# Patient Record
Sex: Female | Born: 1960 | Race: Black or African American | Hispanic: No | Marital: Single | Smoking: Never smoker
Health system: Southern US, Community
[De-identification: ages and names within clinical notes are randomized; demographics above are authoritative.]

## PROBLEM LIST (undated history)

## (undated) ENCOUNTER — Ambulatory Visit: Admission: EM | Source: Home / Self Care

## (undated) DIAGNOSIS — H409 Unspecified glaucoma: Secondary | ICD-10-CM

## (undated) DIAGNOSIS — M1990 Unspecified osteoarthritis, unspecified site: Secondary | ICD-10-CM

## (undated) DIAGNOSIS — E119 Type 2 diabetes mellitus without complications: Secondary | ICD-10-CM

## (undated) DIAGNOSIS — M7989 Other specified soft tissue disorders: Secondary | ICD-10-CM

## (undated) DIAGNOSIS — E785 Hyperlipidemia, unspecified: Secondary | ICD-10-CM

## (undated) DIAGNOSIS — Z8619 Personal history of other infectious and parasitic diseases: Secondary | ICD-10-CM

## (undated) DIAGNOSIS — R0602 Shortness of breath: Secondary | ICD-10-CM

## (undated) DIAGNOSIS — E538 Deficiency of other specified B group vitamins: Secondary | ICD-10-CM

## (undated) DIAGNOSIS — Z5189 Encounter for other specified aftercare: Secondary | ICD-10-CM

## (undated) DIAGNOSIS — M549 Dorsalgia, unspecified: Secondary | ICD-10-CM

## (undated) DIAGNOSIS — M3500 Sicca syndrome, unspecified: Secondary | ICD-10-CM

## (undated) DIAGNOSIS — I10 Essential (primary) hypertension: Secondary | ICD-10-CM

## (undated) DIAGNOSIS — R7303 Prediabetes: Secondary | ICD-10-CM

## (undated) DIAGNOSIS — K635 Polyp of colon: Secondary | ICD-10-CM

## (undated) DIAGNOSIS — D649 Anemia, unspecified: Secondary | ICD-10-CM

## (undated) DIAGNOSIS — R51 Headache: Secondary | ICD-10-CM

## (undated) DIAGNOSIS — M069 Rheumatoid arthritis, unspecified: Secondary | ICD-10-CM

## (undated) DIAGNOSIS — M2550 Pain in unspecified joint: Secondary | ICD-10-CM

## (undated) DIAGNOSIS — O039 Complete or unspecified spontaneous abortion without complication: Secondary | ICD-10-CM

## (undated) DIAGNOSIS — R062 Wheezing: Secondary | ICD-10-CM

## (undated) DIAGNOSIS — Z332 Encounter for elective termination of pregnancy: Secondary | ICD-10-CM

## (undated) DIAGNOSIS — R519 Headache, unspecified: Secondary | ICD-10-CM

## (undated) DIAGNOSIS — H269 Unspecified cataract: Secondary | ICD-10-CM

## (undated) DIAGNOSIS — K219 Gastro-esophageal reflux disease without esophagitis: Secondary | ICD-10-CM

## (undated) DIAGNOSIS — I2699 Other pulmonary embolism without acute cor pulmonale: Secondary | ICD-10-CM

## (undated) HISTORY — DX: Shortness of breath: R06.02

## (undated) HISTORY — DX: Prediabetes: R73.03

## (undated) HISTORY — DX: Deficiency of other specified B group vitamins: E53.8

## (undated) HISTORY — DX: Encounter for other specified aftercare: Z51.89

## (undated) HISTORY — DX: Type 2 diabetes mellitus without complications: E11.9

## (undated) HISTORY — DX: Other specified soft tissue disorders: M79.89

## (undated) HISTORY — DX: Complete or unspecified spontaneous abortion without complication: O03.9

## (undated) HISTORY — DX: Unspecified glaucoma: H40.9

## (undated) HISTORY — DX: Hyperlipidemia, unspecified: E78.5

## (undated) HISTORY — DX: Anemia, unspecified: D64.9

## (undated) HISTORY — DX: Pain in unspecified joint: M25.50

## (undated) HISTORY — DX: Essential (primary) hypertension: I10

## (undated) HISTORY — DX: Unspecified osteoarthritis, unspecified site: M19.90

## (undated) HISTORY — DX: Wheezing: R06.2

## (undated) HISTORY — DX: Polyp of colon: K63.5

## (undated) HISTORY — PX: MENISCUS REPAIR: SHX5179

## (undated) HISTORY — DX: Unspecified cataract: H26.9

## (undated) HISTORY — DX: Dorsalgia, unspecified: M54.9

## (undated) HISTORY — DX: Rheumatoid arthritis, unspecified: M06.9

## (undated) HISTORY — PX: COLONOSCOPY: SHX174

## (undated) HISTORY — PX: ABDOMINAL SURGERY: SHX537

## (undated) HISTORY — DX: Sjogren syndrome, unspecified: M35.00

## (undated) HISTORY — DX: Gastro-esophageal reflux disease without esophagitis: K21.9

## (undated) HISTORY — PX: DILATION AND CURETTAGE OF UTERUS: SHX78

## (undated) HISTORY — DX: Encounter for elective termination of pregnancy: Z33.2

## (undated) HISTORY — PX: UTERINE FIBROID SURGERY: SHX826

---

## 1992-01-02 HISTORY — PX: PELVIC LAPAROSCOPY: SHX162

## 1997-11-30 ENCOUNTER — Inpatient Hospital Stay (HOSPITAL_COMMUNITY): Admission: EM | Admit: 1997-11-30 | Discharge: 1997-12-05 | Payer: Self-pay | Admitting: *Deleted

## 1999-04-20 ENCOUNTER — Other Ambulatory Visit: Admission: RE | Admit: 1999-04-20 | Discharge: 1999-04-20 | Payer: Self-pay | Admitting: Gynecology

## 1999-12-04 ENCOUNTER — Encounter: Payer: Self-pay | Admitting: Gynecology

## 1999-12-04 ENCOUNTER — Ambulatory Visit (HOSPITAL_COMMUNITY): Admission: RE | Admit: 1999-12-04 | Discharge: 1999-12-04 | Payer: Self-pay | Admitting: Gynecology

## 2000-05-07 ENCOUNTER — Other Ambulatory Visit: Admission: RE | Admit: 2000-05-07 | Discharge: 2000-05-07 | Payer: Self-pay | Admitting: Gynecology

## 2000-08-06 ENCOUNTER — Other Ambulatory Visit: Admission: RE | Admit: 2000-08-06 | Discharge: 2000-08-06 | Payer: Self-pay | Admitting: Gynecology

## 2001-01-24 ENCOUNTER — Emergency Department (HOSPITAL_COMMUNITY): Admission: EM | Admit: 2001-01-24 | Discharge: 2001-01-25 | Payer: Self-pay | Admitting: Emergency Medicine

## 2001-05-29 ENCOUNTER — Other Ambulatory Visit: Admission: RE | Admit: 2001-05-29 | Discharge: 2001-05-29 | Payer: Self-pay | Admitting: Internal Medicine

## 2002-09-11 ENCOUNTER — Other Ambulatory Visit: Admission: RE | Admit: 2002-09-11 | Discharge: 2002-09-11 | Payer: Self-pay | Admitting: Gynecology

## 2002-09-17 ENCOUNTER — Emergency Department (HOSPITAL_COMMUNITY): Admission: EM | Admit: 2002-09-17 | Discharge: 2002-09-17 | Payer: Self-pay | Admitting: Emergency Medicine

## 2002-09-18 ENCOUNTER — Encounter: Payer: Self-pay | Admitting: Emergency Medicine

## 2002-09-18 ENCOUNTER — Emergency Department (HOSPITAL_COMMUNITY): Admission: EM | Admit: 2002-09-18 | Discharge: 2002-09-18 | Payer: Self-pay | Admitting: Emergency Medicine

## 2003-10-25 ENCOUNTER — Other Ambulatory Visit: Admission: RE | Admit: 2003-10-25 | Discharge: 2003-10-25 | Payer: Self-pay | Admitting: Gynecology

## 2003-12-16 ENCOUNTER — Ambulatory Visit (HOSPITAL_COMMUNITY): Admission: RE | Admit: 2003-12-16 | Discharge: 2003-12-16 | Payer: Self-pay | Admitting: Specialist

## 2004-10-24 ENCOUNTER — Other Ambulatory Visit: Admission: RE | Admit: 2004-10-24 | Discharge: 2004-10-24 | Payer: Self-pay | Admitting: Gynecology

## 2005-02-23 ENCOUNTER — Encounter: Admission: RE | Admit: 2005-02-23 | Discharge: 2005-02-23 | Payer: Self-pay | Admitting: Rheumatology

## 2005-06-18 ENCOUNTER — Ambulatory Visit: Payer: Self-pay | Admitting: Oncology

## 2005-07-05 LAB — CBC WITH DIFFERENTIAL/PLATELET
Basophils Absolute: 0 10*3/uL (ref 0.0–0.1)
Eosinophils Absolute: 0.1 10*3/uL (ref 0.0–0.5)
HCT: 34.2 % — ABNORMAL LOW (ref 34.8–46.6)
HGB: 11.5 g/dL — ABNORMAL LOW (ref 11.6–15.9)
MCH: 27.2 pg (ref 26.0–34.0)
MONO#: 0.4 10*3/uL (ref 0.1–0.9)
NEUT#: 0.9 10*3/uL — ABNORMAL LOW (ref 1.5–6.5)
NEUT%: 29 % — ABNORMAL LOW (ref 39.6–76.8)
RDW: 13.8 % (ref 11.3–14.5)
lymph#: 1.8 10*3/uL (ref 0.9–3.3)

## 2005-07-10 LAB — VITAMIN B12: Vitamin B-12: 623 pg/mL (ref 211–911)

## 2005-07-10 LAB — IRON AND TIBC
%SAT: 9 % — ABNORMAL LOW (ref 20–55)
Iron: 34 ug/dL — ABNORMAL LOW (ref 42–145)
TIBC: 370 ug/dL (ref 250–470)
UIBC: 336 ug/dL

## 2005-07-10 LAB — FERRITIN: Ferritin: 10 ng/mL (ref 10–291)

## 2005-07-10 LAB — HEMOGLOBINOPATHY EVALUATION
Hgb A2 Quant: 2.7 % (ref 2.2–3.2)
Hgb F Quant: 0 % (ref 0.0–0.5)

## 2005-08-17 ENCOUNTER — Ambulatory Visit: Payer: Self-pay | Admitting: Oncology

## 2005-08-24 LAB — CBC WITH DIFFERENTIAL/PLATELET
BASO%: 1.5 % (ref 0.0–2.0)
Basophils Absolute: 0.1 10*3/uL (ref 0.0–0.1)
EOS%: 2.4 % (ref 0.0–7.0)
Eosinophils Absolute: 0.1 10*3/uL (ref 0.0–0.5)
HCT: 32.2 % — ABNORMAL LOW (ref 34.8–46.6)
HGB: 10.6 g/dL — ABNORMAL LOW (ref 11.6–15.9)
LYMPH%: 57 % — ABNORMAL HIGH (ref 14.0–48.0)
MCH: 26.3 pg (ref 26.0–34.0)
MCHC: 33 g/dL (ref 32.0–36.0)
MCV: 79.7 fL — ABNORMAL LOW (ref 81.0–101.0)
MONO#: 0.3 10*3/uL (ref 0.1–0.9)
MONO%: 9.5 % (ref 0.0–13.0)
NEUT#: 1 10*3/uL — ABNORMAL LOW (ref 1.5–6.5)
NEUT%: 29.6 % — ABNORMAL LOW (ref 39.6–76.8)
Platelets: 272 10*3/uL (ref 145–400)
RBC: 4.05 10*6/uL (ref 3.70–5.32)
RDW: 15.1 % — ABNORMAL HIGH (ref 11.3–14.5)
WBC: 3.4 10*3/uL — ABNORMAL LOW (ref 3.9–10.0)
lymph#: 2 10*3/uL (ref 0.9–3.3)

## 2005-10-30 ENCOUNTER — Ambulatory Visit: Payer: Self-pay | Admitting: Oncology

## 2006-05-24 ENCOUNTER — Other Ambulatory Visit: Admission: RE | Admit: 2006-05-24 | Discharge: 2006-05-24 | Payer: Self-pay | Admitting: Gynecology

## 2006-10-10 ENCOUNTER — Ambulatory Visit: Payer: Self-pay | Admitting: Oncology

## 2007-03-26 ENCOUNTER — Ambulatory Visit (HOSPITAL_BASED_OUTPATIENT_CLINIC_OR_DEPARTMENT_OTHER): Admission: RE | Admit: 2007-03-26 | Discharge: 2007-03-26 | Payer: Self-pay | Admitting: Orthopedic Surgery

## 2007-06-26 ENCOUNTER — Other Ambulatory Visit: Admission: RE | Admit: 2007-06-26 | Discharge: 2007-06-26 | Payer: Self-pay | Admitting: Gynecology

## 2007-08-12 ENCOUNTER — Ambulatory Visit: Payer: Self-pay | Admitting: Oncology

## 2009-08-22 ENCOUNTER — Ambulatory Visit: Payer: Self-pay | Admitting: Gynecology

## 2009-08-22 ENCOUNTER — Other Ambulatory Visit: Admission: RE | Admit: 2009-08-22 | Discharge: 2009-08-22 | Payer: Self-pay | Admitting: Gynecology

## 2009-08-26 ENCOUNTER — Ambulatory Visit: Payer: Self-pay | Admitting: Gynecology

## 2009-09-29 ENCOUNTER — Ambulatory Visit: Payer: Self-pay | Admitting: Oncology

## 2009-11-03 ENCOUNTER — Ambulatory Visit: Payer: Self-pay | Admitting: Oncology

## 2010-05-16 NOTE — Op Note (Signed)
NAMEDOMINIQUE, Amber [name]           ACCOUNT NO.:  [id number]   MEDICAL RECORD NO.:  [id number]          PATIENT TYPE:  AMB   LOCATION:  DSC                          FACILITY:  [facility]   PHYSICIAN:  Harvie Junior, M.D.   DATE OF BIRTH:  1960-10-29   DATE OF PROCEDURE:  DATE OF DISCHARGE:                               OPERATIVE REPORT   PREOPERATIVE DIAGNOSIS:  Lateral meniscal tear.   POSTOPERATIVE DIAGNOSES:  1. Lateral meniscal tear.  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   PRINCIPAL PROCEDURE:  1. Partial lateral meniscectomy in the mid body.  2. Chondroplasty of medial femoral condyle.  3. Removal of multiple small cartilaginous loose bodies.   SURGEON:  John AL. Luiz Blare, M.D.   ASSISTANT:  Marshia Ly, P.AE.   ANESTHESIA:  General.   BRIEF HISTORY:  Amber [name] is a 51 year old female with a long history  of having had significant pain in the knee.  She was treated  conservatively for a period of time because of continuing complaints of  pain.  She underwent MRI examination which showed lateral meniscal tear.  She is brought to the operating room after failure of conservative care  with continued medial side pain and lateral joint line tenderness.   PROCEDURE:  The patient was taken to the operating room.  After adequate  general anesthesia was obtained with general anesthetic and patient  positioned on the operating table, the right leg was then prepped and  draped in the usual sterile fashion.  Following routine arthroscope  examination, the knee revealed there was an obvious lateral meniscal  tear.  This were debrided with the straight biting forceps and left hand  side biting forceps.  The remaining wound was contoured down with a  suction shaver.  Following this, attention was turned to the medial  compartment where a large chondral defect was encountered.  It was grade  II in nature on the medial femoral condyle in the weight bearing area.  This was  debrided back to smooth stable rim.  Multiple  osteocartilaginous loose bodies were identified in the medial  compartment up under the meniscus in the back and these were debrided  with a suction shaver.  Once this was completed, attention was turned to  the patellofemoral joint which looked pristine, slight lateral tracking,  but not enough to consider lateral release at this point.  The knee was  copiously and thoroughly irrigated. We went up into the suprapatellar  pouch, went laterally and medially looking for cartilaginous loose  bodies and more were found and these were debrided with a suction  shaver.  Once this was completed, the knee was  copiously and thoroughly irrigated and suction dried.  Arthroscopic  portals were closed with a bandage and a sterile compressive dressing  was applied.  The patient was taken to recovery and noted to be in  satisfactory condition. Estimated blood for the procedure was none.      Harvie Junior, M.D.  Electronically Signed     JLG/MEDQ  D:  03/26/2007  T:  03/26/2007  Job:  [id number]   cc:  Harvie Junior, M.D.

## 2010-08-11 ENCOUNTER — Encounter: Payer: Self-pay | Admitting: Anesthesiology

## 2010-08-25 ENCOUNTER — Encounter: Payer: Self-pay | Admitting: Gynecology

## 2010-09-15 ENCOUNTER — Ambulatory Visit (INDEPENDENT_AMBULATORY_CARE_PROVIDER_SITE_OTHER): Payer: BC Managed Care – PPO | Admitting: Gynecology

## 2010-09-15 ENCOUNTER — Other Ambulatory Visit (HOSPITAL_COMMUNITY)
Admission: RE | Admit: 2010-09-15 | Discharge: 2010-09-15 | Disposition: A | Discharge status: Home / Self Care | Payer: BC Managed Care – PPO | Source: Ambulatory Visit | Attending: Gynecology | Admitting: Gynecology

## 2010-09-15 ENCOUNTER — Encounter: Payer: Self-pay | Admitting: Gynecology

## 2010-09-15 VITALS — BP 128/82 | Ht 60.5 in | Wt 189.0 lb

## 2010-09-15 DIAGNOSIS — N946 Dysmenorrhea, unspecified: Secondary | ICD-10-CM | POA: Insufficient documentation

## 2010-09-15 DIAGNOSIS — Z01419 Encounter for gynecological examination (general) (routine) without abnormal findings: Secondary | ICD-10-CM

## 2010-09-15 DIAGNOSIS — N920 Excessive and frequent menstruation with regular cycle: Secondary | ICD-10-CM

## 2010-09-15 DIAGNOSIS — R809 Proteinuria, unspecified: Secondary | ICD-10-CM

## 2010-09-15 DIAGNOSIS — N949 Unspecified condition associated with female genital organs and menstrual cycle: Secondary | ICD-10-CM

## 2010-09-15 DIAGNOSIS — Z13220 Encounter for screening for lipoid disorders: Secondary | ICD-10-CM

## 2010-09-15 DIAGNOSIS — R102 Pelvic and perineal pain: Secondary | ICD-10-CM

## 2010-09-15 DIAGNOSIS — Z833 Family history of diabetes mellitus: Secondary | ICD-10-CM

## 2010-09-15 DIAGNOSIS — R635 Abnormal weight gain: Secondary | ICD-10-CM

## 2010-09-15 LAB — HEMOGLOBIN A1C: Hgb A1c MFr Bld: 6 % — ABNORMAL HIGH (ref <5.7)

## 2010-09-15 MED ORDER — TRANEXAMIC ACID 650 MG PO TABS: ORAL_TABLET | ORAL | Status: DC

## 2010-09-15 NOTE — Progress Notes (Signed)
Amber [name] 03-18-1960 [id number]   History:    50 y.o.  for annual exam with several complaints one being dysmenorrhea and menorrhagia her cycles are lasting about 7 days. Patient does have a history of a fibroid uterus. She had an ultrasound the office in August of last year her uterus measured 10 x 7 x 6 cm and had approximately 5 fibroids the largest one measuring 47 x 42 mm. Patient has a history in the past the right salpingostomy secondary to ectopic pregnancy back in 1994 and in 1999 she had another laparoscopy in another facility which an incidental enterotomy has occurred requiring emergency laparotomy. Her primary physician is Dr. Gertie Gowda who has been monitor her for hypertension. She suffers also from chronic anemia review of her record and indicated that in the past she had been referred to the hematologist oncologist Dr. Wendee Copp all who had worked her up for a neutral p.m. which was mild and stable and had stated this is probably a benign and normal variant often seen in African American is nevertheless she is taking iron supplementation as well. Patient's last mammogram was in August of 2011 she does her monthly self breast examinations. Recently was complaining of some tenderness on the outer aspect of her but left breast. She denied any particular masses nipple discharge or recent injury. Review of her record and indicated she was weighing 186/years and weight 189 at the present time.  Past medical history,surgical history, family history and social history were all reviewed and documented in the EPIC chart.  ROS:  Was performed and pertinent positives and negatives are included in the history.  Exam: chaperone present BP 128/82  Ht 5' 0.5" (1.537 m)  Wt 189 lb (85.73 kg)  BMI 36.30 kg/m2  LMP 08/23/2010  Body mass index is 36.30 kg/(m^2).  General appearance : Well developed well nourished female. No acute distress HEENT: Neck supple, trachea midline, no carotid bruits,  no thyroidmegaly Lungs: Clear to auscultation, no rhonchi or wheezes, or rib retractions  Heart: Regular rate and rhythm, no murmurs or gallops Breast:Examined in sitting and supine position were symmetrical in appearance, no palpable masses or tenderness,  no skin retraction, no nipple inversion, no nipple discharge, no skin discoloration, no axillary or supraclavicular lymphadenopathy Abdomen: no palpable masses or tenderness, no rebound or guarding Extremities: no edema or skin discoloration or tenderness  Pelvic:  Bartholin, Urethra, Skene Glands: Within normal limits             Vagina: No gross lesions or discharge  Cervix: No gross lesions or discharge  Uterus   approximately 10 week size and irregular,and mobile  Adnexa  Without masses or tenderness  Anus and perineum  normal   Rectovaginal  normal sphincter tone without palpated masses or tenderness             Hemoccult not done     Assessment/Plan:  50 y.o. female for annual exam with a 10-12 week size irregular uterus may be contributing factor to her dysmenorrhea and menorrhagia. Patient not sexually active. Past history of right salpingostomy for right tubal ectopic pregnancy. Will followup with an ultrasound next week. 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 She may take a nonsteroidal over-the-counter when necessary. She'll be scheduled for her screening mammogram the next couple weeks. For her dysmenorrhea and menorrhagia were going to place her on Lysteda 650 mg 2 tablets  3 times a day for 5 days. Risks benefits and pros and cons were discussed. She'll stop by the lab we'll check her CBC cholesterol we'll do a hemoglobin A1c because of her family history of diabetes or by 2 of her sisters have insulin-dependent diabetes. Because or weight gain will check her TSH as well as a urinalysis and Pap smear. We'll discuss these results  when she returns back next week for the ultrasound and plan a course of management.    Ok Edwards MD, 10:59 AM 09/15/2010

## 2010-09-15 NOTE — Progress Notes (Signed)
Addended byCammie Mcgee T on: 09/15/2010 11:35 AM   Modules accepted: Orders

## 2010-09-18 ENCOUNTER — Telehealth: Payer: Self-pay | Admitting: *Deleted

## 2010-09-18 DIAGNOSIS — D649 Anemia, unspecified: Secondary | ICD-10-CM

## 2010-09-18 NOTE — Telephone Encounter (Signed)
PT PT INFORMED WITH THE BELOW NOTE, ORDER IN COMPUTER.

## 2010-09-18 NOTE — Telephone Encounter (Signed)
Error - wrong pt

## 2010-09-18 NOTE — Telephone Encounter (Signed)
Message copied by Aura Camps on Mon Sep 18, 2010 10:46 AM ------      Message from: Ok Edwards      Created: Mon Sep 18, 2010 10:05 AM       Victorino Dike, I left a message in reference to this patient earlier this morning for her to come in a fasting state to check her fasting blood sugar. Since that time her received additional blood work and she has severe anemia. When she comes in for a fasting blood sugar I would like for her to have drawn at the same time an anemia panel. I need to see her within a week after she has the above lab work drawn.

## 2010-09-20 ENCOUNTER — Other Ambulatory Visit (INDEPENDENT_AMBULATORY_CARE_PROVIDER_SITE_OTHER): Payer: BC Managed Care – PPO | Admitting: *Deleted

## 2010-09-20 DIAGNOSIS — R7309 Other abnormal glucose: Secondary | ICD-10-CM

## 2010-09-20 DIAGNOSIS — D649 Anemia, unspecified: Secondary | ICD-10-CM

## 2010-09-20 LAB — IRON AND TIBC
%SAT: 4 % — ABNORMAL LOW (ref 20–55)
TIBC: 368 ug/dL (ref 250–470)

## 2010-09-20 LAB — FOLATE: Folate: 12.4 ng/mL

## 2010-09-20 LAB — VITAMIN B12: Vitamin B-12: 771 pg/mL (ref 211–911)

## 2010-09-22 ENCOUNTER — Telehealth: Payer: Self-pay | Admitting: *Deleted

## 2010-09-22 ENCOUNTER — Other Ambulatory Visit: Payer: Self-pay | Admitting: *Deleted

## 2010-09-22 ENCOUNTER — Ambulatory Visit (INDEPENDENT_AMBULATORY_CARE_PROVIDER_SITE_OTHER): Payer: BC Managed Care – PPO | Admitting: Gynecology

## 2010-09-22 ENCOUNTER — Ambulatory Visit (INDEPENDENT_AMBULATORY_CARE_PROVIDER_SITE_OTHER): Payer: BC Managed Care – PPO

## 2010-09-22 DIAGNOSIS — N920 Excessive and frequent menstruation with regular cycle: Secondary | ICD-10-CM

## 2010-09-22 DIAGNOSIS — N946 Dysmenorrhea, unspecified: Secondary | ICD-10-CM

## 2010-09-22 DIAGNOSIS — D649 Anemia, unspecified: Secondary | ICD-10-CM

## 2010-09-22 DIAGNOSIS — Z3049 Encounter for surveillance of other contraceptives: Secondary | ICD-10-CM

## 2010-09-22 DIAGNOSIS — D709 Neutropenia, unspecified: Secondary | ICD-10-CM

## 2010-09-22 DIAGNOSIS — D259 Leiomyoma of uterus, unspecified: Secondary | ICD-10-CM

## 2010-09-22 DIAGNOSIS — R102 Pelvic and perineal pain: Secondary | ICD-10-CM

## 2010-09-22 DIAGNOSIS — D219 Benign neoplasm of connective and other soft tissue, unspecified: Secondary | ICD-10-CM | POA: Diagnosis present

## 2010-09-22 MED ORDER — LEVONORGESTREL 20 MCG/24HR IU IUD: INTRAUTERINE_SYSTEM | Freq: Once | INTRAUTERINE | Status: DC

## 2010-09-22 NOTE — Progress Notes (Signed)
Patient is a 50 year old who seen in the office on September 14 her annual gynecological examination. Patient been complaining of dysmenorrhea and menorrhagia. She states her menstrual cycles last for approximately 7 days. Patient with prior history of ultrasound demonstrated a fibroid uterus. Last ultrasound the office on 08/26/2009 had demonstrated a uterus that measured 10.6 x 7.3 x 6.5 cm with an endometrial stripe of 6.7 mm she had approximately 5 fibroids largest one measuring 47 x 42 mm. She also had had a sonohysterogram with no intracavitary defect. At time of her annual exam we had checked her CBC and noticed that her hemoglobin was down to 8.6 hematocrit 28.3 platelet count 350,000. Her MCV was 68.9, MCH was 20.9, MCHC 30.4 all low values. An anemia panel was ordered which demonstrated a ferritin level of 8% saturation for all indicative R. deficiency anemia. Her total iron binding capacity was normal at 368. Folate was normal at 12.4 and vitamin D 12 was normal at 771. Her hemoglobin A1c was 6.0 slightly elevated she return for a fasting blood sugar which was 96. Total cholesterol was normal at 121. TSH and urinalysis and Pap smear were normal. Of note her white blood count was 3.4. She had been referred to Dr. Mancel Bale hematologist oncologist in 2007 as a result of her neutropenia and he felt there was a benign normal variant possibly benign normal variant. She has had a history of chronic microcytic anemia. She is currently taking iron one tablet daily. A hemoglobin electrophoresis was normal in July of this same year. She was supposed to return for a CBC and repeat iron studies 6 weeks from that visit in 2007 after Dr. Myrle Sheng had recommended patient to increase her to 3 times a day but never followed up on that appointment. We repeated her ultrasound today in the office the ultrasound essentially unchanged as a uterine size with the following dimension: 10.6 x 8.1 x 7.8 cm with an endometrial  stripe of 5.5 mm. The 5 fibroids were once again noted but smaller in size from previous scan last year the largest one measured 3.6 x 2.2 cm the ovaries appeared to be normal.  I spoke with Dr. Truett Perna today he recommended the patient increase her iron to 3 times a day and to repeat her CBC in one month if values are still low at he would like to see her in the office. I had started the patient on Lysteda 650 mg 2 tablets 3 times a day for 5 days of her cycle to cut down her bleeding and cramping as well. We discussed about placing her on the Mirena IUD to help decrease her amount of bleeding and minimize her chances of needing a hysterectomy which would eventually be recommended if all the above fails. Patient was otherwise asymptomatic today and is well compensated with her chronic anemia. All the above was discussed with the patient and we'll follow accordingly.

## 2010-09-22 NOTE — Telephone Encounter (Signed)
Patient informed benefits on Mirena IUD.  She will have $25 copay.  Patient will have Dr. Lily Peer insert next week.

## 2010-09-22 NOTE — Telephone Encounter (Signed)
Lm for patient to call about benefits.

## 2010-09-22 NOTE — Patient Instructions (Signed)
Will notify you with appointment to see Dr. Myrle Sheng next week. Increase your iron to 2-3 times per day. Amy will call you to arrange for IUD placement next week.

## 2010-09-22 NOTE — Telephone Encounter (Signed)
Message copied by Libby Maw on Fri Sep 22, 2010  2:15 PM ------      Message from: Ok Edwards      Created: Fri Sep 22, 2010 12:10 PM       Nicle Connole, please make arrangements for this patient to have a Mirena IUD next week. Patient with menorrhagia and severe anemia. She was instructed to take her iron tablet 3 times a day and had also given her prescription for Lysteda. Please check her insurance verification and coordinating schedule for next week. Thank you

## 2010-09-22 NOTE — Telephone Encounter (Signed)
Message copied by Libby Maw on Fri Sep 22, 2010  3:41 PM ------      Message from: Ok Edwards      Created: Fri Sep 22, 2010 12:10 PM       Amber [name], please make arrangements for this patient to have a Mirena IUD next week. Patient with menorrhagia and severe anemia. She was instructed to take her iron tablet 3 times a day and had also given her prescription for Lysteda. Please check her insurance verification and coordinating schedule for next week. Thank you

## 2010-09-25 ENCOUNTER — Encounter: Payer: Self-pay | Admitting: Gynecology

## 2010-09-25 LAB — POCT HEMOGLOBIN-HEMACUE: Hemoglobin: 11.3 — ABNORMAL LOW

## 2010-09-27 ENCOUNTER — Ambulatory Visit (INDEPENDENT_AMBULATORY_CARE_PROVIDER_SITE_OTHER): Payer: BC Managed Care – PPO | Admitting: Gynecology

## 2010-09-27 ENCOUNTER — Encounter: Payer: Self-pay | Admitting: Gynecology

## 2010-09-27 VITALS — BP 130/82

## 2010-09-27 DIAGNOSIS — Z30430 Encounter for insertion of intrauterine contraceptive device: Secondary | ICD-10-CM

## 2010-09-27 DIAGNOSIS — Z30431 Encounter for routine checking of intrauterine contraceptive device: Secondary | ICD-10-CM

## 2010-09-27 DIAGNOSIS — Z3049 Encounter for surveillance of other contraceptives: Secondary | ICD-10-CM

## 2010-09-27 NOTE — Progress Notes (Signed)
Patient presented to the office today for placement number an IUD to help with her menorrhagia and iron deficiency anemia see previous office note. Consent form had been signed patient been counseled as to risks benefits and pros and cons.  Procedure: Pelvic examination: Bartholin urethra Skene was within normal limits Vagina: No gross lesions on inspection Cervix: No lesions or discharge Uterus anteverted approximately 8 weeks size Adnexa: No palpable mass or tenderness   The vagina was prepped with Betadine solution. A single-tooth tenaculum was placed on the anterior cervical lip. The uterus sounded to 7-1/2 cm. The Mirena IUD was placed in a sterile fashion.  Patient will return back in one month for followup as well as for a CBC to check her hemoglobin since she was instructed to take iron tablets 3 times a day. Patient's not currently sexually active but was informed that the Mirena IUD is good for 5 years. Instructions were provided and will follow accordingly.

## 2010-09-27 NOTE — Patient Instructions (Signed)
Follow up 1 month for CBC and IUD check

## 2010-09-27 NOTE — Progress Notes (Signed)
Addended by: Bertram Savin A on: 09/27/2010 03:15 PM   Modules accepted: Orders

## 2010-10-05 ENCOUNTER — Encounter: Payer: Self-pay | Admitting: Gynecology

## 2010-10-24 ENCOUNTER — Encounter: Payer: Self-pay | Admitting: Gynecology

## 2010-10-24 ENCOUNTER — Ambulatory Visit (INDEPENDENT_AMBULATORY_CARE_PROVIDER_SITE_OTHER): Payer: BC Managed Care – PPO | Admitting: Gynecology

## 2010-10-24 VITALS — BP 130/80

## 2010-10-24 DIAGNOSIS — Z30431 Encounter for routine checking of intrauterine contraceptive device: Secondary | ICD-10-CM

## 2010-10-24 DIAGNOSIS — D259 Leiomyoma of uterus, unspecified: Secondary | ICD-10-CM

## 2010-10-24 DIAGNOSIS — D649 Anemia, unspecified: Secondary | ICD-10-CM

## 2010-10-24 NOTE — Patient Instructions (Signed)
This is a reminder for you: Continue to take your iron tablet 3 times a day and after the end of next month if her cycles are once a month and very light you can back down your arm tablet to 2 tablets a day. You can take the lysteda 2 tablets 3 times a day for 5 days during her menstrual cycle to cut down the amount of bleeding. I would like you to return to the office in 3 months for followup CBC blood count. When you return next year for your annual exams we'll be doing an ultrasound once a year to followup on fibroids.

## 2010-10-24 NOTE — Progress Notes (Signed)
Patient presented to the office today for followup after having placed in the Mirena IUD one month ago. This was placed in an effort to control her dysmenorrhea and iron deficiency anemia. Her hemoglobin had been as low as 8.6 and she had been referred to the hematologist as a result of her neutropenia. Her hemoglobin now today has risen to 10.6 her bleeding has decreased tremendously very blue no periods at all during one time a month. She is taking lysteda 650 mg tablet 2-3 times a day for 5 days to her cycle also to help contain the amount of bleeding. It appears to have worked. She will return back in 3 months for followup CBC. An annual he will followup with an ultrasound. Her white blood count had returned back to normal at 4.3. On exam the IUD string was seen and we'll follow accordingly. Bimanual examination unremarkable with the exception of the irregular shaped 10 week size uterus.

## 2011-10-25 LAB — HM DIABETES EYE EXAM

## 2011-11-05 ENCOUNTER — Encounter: Payer: BC Managed Care – PPO | Admitting: Gynecology

## 2011-11-05 ENCOUNTER — Ambulatory Visit (INDEPENDENT_AMBULATORY_CARE_PROVIDER_SITE_OTHER): Payer: BC Managed Care – PPO | Admitting: Gynecology

## 2011-11-05 ENCOUNTER — Encounter: Payer: Self-pay | Admitting: Gynecology

## 2011-11-05 ENCOUNTER — Other Ambulatory Visit: Payer: Self-pay | Admitting: Gynecology

## 2011-11-05 VITALS — BP 130/88 | Ht 60.5 in | Wt 197.0 lb

## 2011-11-05 DIAGNOSIS — D219 Benign neoplasm of connective and other soft tissue, unspecified: Secondary | ICD-10-CM

## 2011-11-05 DIAGNOSIS — Z833 Family history of diabetes mellitus: Secondary | ICD-10-CM

## 2011-11-05 DIAGNOSIS — M3500 Sicca syndrome, unspecified: Secondary | ICD-10-CM | POA: Insufficient documentation

## 2011-11-05 DIAGNOSIS — I10 Essential (primary) hypertension: Secondary | ICD-10-CM | POA: Insufficient documentation

## 2011-11-05 DIAGNOSIS — D259 Leiomyoma of uterus, unspecified: Secondary | ICD-10-CM

## 2011-11-05 DIAGNOSIS — Z01419 Encounter for gynecological examination (general) (routine) without abnormal findings: Secondary | ICD-10-CM

## 2011-11-05 DIAGNOSIS — Z975 Presence of (intrauterine) contraceptive device: Secondary | ICD-10-CM

## 2011-11-05 LAB — HEMOGLOBIN A1C
Hgb A1c MFr Bld: 6.4 % — ABNORMAL HIGH (ref <5.7)
Mean Plasma Glucose: 137 mg/dL — ABNORMAL HIGH (ref <117)

## 2011-11-05 LAB — CHOLESTEROL, TOTAL: Cholesterol: 118 mg/dL (ref 0–200)

## 2011-11-05 LAB — TSH: TSH: 0.923 u[IU]/mL (ref 0.350–4.500)

## 2011-11-05 MED ORDER — HYDROCHLOROTHIAZIDE 12.5 MG PO CAPS: 12.5000 mg | ORAL_CAPSULE | Freq: Every day | ORAL | Status: DC

## 2011-11-05 NOTE — Patient Instructions (Addendum)
Breast Self-Awareness  Practicing breast self-awareness may pick up problems early, prevent significant medical complications, and possibly save your life. By practicing breast self-awareness, you can become familiar with how your breasts look and feel and if your breasts are changing. This allows you to notice changes early. It can also offer you some reassurance that your breast health is good. One way to learn what is normal for your breasts and whether your breasts are changing is to do a breast self-exam.  If you find a lump or something that was not present in the past, it is best to contact your caregiver right away. Other findings that should be evaluated by your caregiver include nipple discharge, especially if it is bloody; skin changes or reddening; areas where the skin seems to be pulled in (retracted); or new lumps and bumps. Breast pain is seldom associated with cancer (malignancy), but should also be evaluated by a caregiver.  BREAST SELF-EXAM  The best time to examine your breasts is 5 7 days after your menstrual period is over. During menstruation, the breasts are lumpier, and it may be more difficult to pick up changes. If you do not menstruate, have reached menopause, or had your uterus removed (hysterectomy), you should examine your breasts at regular intervals, such as monthly. If you are breastfeeding, examine your breasts after a feeding or after using a breast pump. Breast implants do not decrease the risk for lumps or tumors, so continue to perform breast self-exams as recommended. Talk to your caregiver about how to determine the difference between the implant and breast tissue. Also, talk about the amount of pressure you should use during the exam. Over time, you will become more familiar with the variations of your breasts and more comfortable with the exam. A breast self-exam requires you to remove all your clothes above the waist.    Look at your breasts and nipples. Stand in front of  a mirror in a room with good lighting. With your hands on your hips, push your hands firmly downward. Look for a difference in shape, contour, and size from one breast to the other (asymmetry). Asymmetry includes puckers, dips, or bumps. Also, look for skin changes, such as reddened or scaly areas on the breasts. Look for nipple changes, such as discharge, dimpling, repositioning, or redness.   Carefully feel your breasts. This is best done either in the shower or tub while using soapy water or when flat on your back. Place the arm (on the side of the breast you are examining) above your head. Use the pads (not the fingertips) of your three middle fingers on your opposite hand to feel your breasts. Start in the underarm area and use  inch (2 cm) overlapping circles to feel your breast. Use 3 different levels of pressure (light, medium, and firm pressure) at each circle before moving to the next circle. The light pressure is needed to feel the tissue closest to the skin. The medium pressure will help to feel breast tissue a little deeper, while the firm pressure is needed to feel the tissue close to the ribs. Continue the overlapping circles, moving downward over the breast until you feel your ribs below your breast. Then, move one finger-width towards the center of the body. Continue to use the  inch (2 cm) overlapping circles to feel your breast as you move slowly up toward the collar bone (clavicle) near the base of the neck. Continue the up and down exam using all 3 pressures   until you reach the middle of the chest. Do this with each breast, carefully feeling for lumps or changes.   Keep a written record with breast changes or normal findings for each breast. By writing this information down, you do not need to depend only on memory for size, tenderness, or location. Write down where you are in your menstrual cycle, if you are still menstruating.   Breast tissue can have some lumps or thick tissue. However,  see your caregiver if you find anything that concerns you.   SEEK MEDICAL CARE IF:   You see a change in shape, contour, or size of your breasts or nipples.    You see skin changes, such as reddened or scaly areas on the breasts or nipples.    You have an unusual discharge from your nipples.    You feel a new lump or unusually thick areas.   Document Released: 12/18/2004 Document Revised: 06/19/2011 Document Reviewed: 04/04/2011  [employer] Patient Information 2013 [employer]

## 2011-11-05 NOTE — Progress Notes (Signed)
Amber [name] [birth year] [id number]   History:    51 y.o.  for annual gyn exam who had a Mirena IUD placed last year to help with her menorrhagia and her chronic anemia. Review of her records indicated that since 2011 she had been evaluated by Dr. Mancel Bale (hematologist oncologist as well as because of her neutropenia. She is currently taking iron supplementation 3 tablets daily. She has had a history of an ultrasound the past and sonohysterogram which had demonstrated she had several fibroids the largest one measuring 4 cm in size and normal ovaries in September 2012. Review of her record also indicated the following: 1 spontaneous AB 1 elective AB 1 right ectopic resulted in salpingostomy (1994) Laparoscopic attempt of removing ovarian cyst by another physician in [place] resulted in an incidental enterotomy regarding laparotomy  Patient states that since September of last year to September this year she was having very light or no periods at all been in October she bled for 6 days. She has history of Sjogren's disease and has been followed by Dr. Dierdre Forth her rheumatologist who has her on plaquanil  and prednisone. She has 2 sisters with insulin-dependent diabetes. Her last Pap smear was normal in 2012. Her last mammogram was normal in 2012. She in frequently does result this examination. She also has had a history of hypertension and has not followup for her primary physician and she had been on HCTZ 12.5 mg daily. She has not had a screening colonoscopy yet.  Past medical history,surgical history, family history and social history were all reviewed and documented in the EPIC chart.  Gynecologic History No LMP recorded. Patient is not currently having periods (Reason: IUD). Contraception: IUD Last Pap: 2012. Results were: normal Last mammogram: 2012. Results were: normal  Obstetric History OB History    Grav Para Term Preterm Abortions TAB SAB Ect Mult Living   3 0   2   1  0     # Outc Date GA Lbr Len/2nd Wgt Sex Del Anes PTL Lv   1 GRA            2 ABT            3 ECT                ROS: A ROS was performed and pertinent positives and negatives are included in the history.  GENERAL: No fevers or chills. HEENT: No change in vision, no earache, sore throat or sinus congestion. NECK: No pain or stiffness. CARDIOVASCULAR: No chest pain or pressure. No palpitations. PULMONARY: No shortness of breath, cough or wheeze. GASTROINTESTINAL: No abdominal pain, nausea, vomiting or diarrhea, melena or bright red blood per rectum. GENITOURINARY: No urinary frequency, urgency, hesitancy or dysuria. MUSCULOSKELETAL: No joint or muscle pain, no back pain, no recent trauma. DERMATOLOGIC: No rash, no itching, no lesions. ENDOCRINE: No polyuria, polydipsia, no heat or cold intolerance. No recent change in weight. HEMATOLOGICAL: No anemia or easy bruising or bleeding. NEUROLOGIC: No headache, seizures, numbness, tingling or weakness. PSYCHIATRIC: No depression, no loss of interest in normal activity or change in sleep pattern.     Exam: chaperone present  BP 130/88  Ht 5' 0.5" (1.537 m)  Wt 197 lb (89.359 kg)  BMI 37.84 kg/m2  Body mass index is 37.84 kg/(m^2).  General appearance : Well developed well nourished female. No acute distress HEENT: Neck supple, trachea midline, no carotid bruits, no thyroidmegaly Lungs: Clear to auscultation, no rhonchi or wheezes, or  rib retractions  Heart: Regular rate and rhythm, no murmurs or gallops Breast:Examined in sitting and supine position were symmetrical in appearance, no palpable masses or tenderness,  no skin retraction, no nipple inversion, no nipple discharge, no skin discoloration, no axillary or supraclavicular lymphadenopathy Abdomen: no palpable masses or tenderness, no rebound or guarding Extremities: no edema or skin discoloration or tenderness  Pelvic:  Bartholin, Urethra, Skene Glands: Within normal limits              Vagina: No gross lesions or discharge  Cervix: No gross lesions or discharge  Uterus  anteverted 8-10 week size slightly irregular,   Adnexa  Without masses or tenderness  Anus and perineum  normal   Rectovaginal  normal sphincter tone without palpated masses or tenderness             Hemoccult cards provided     Assessment/Plan:  51 y.o. female for annual exam patient with long-standing history of iron deficiency anemia attributed to her fibroid uterus. It appears the Mirena IUD has helped her tremendously. We will check today her CBC see what her hemoglobin is. We'll also check a comprehensive metabolic panel, TSH, screening cholesterol, hemoglobin A1c and urinalysis. She was given a prescription refill for her HCTZ 12.5 mg daily for hypertension and was reminded to followup with her internist. She will return back to the office in 2 weeks for followup ultrasound to follow the transit of her fibroid uterus. She will continue on her iron tablet one tablet 3 times a day. She was given the name of her gastroenterologist her to schedule her screening colonoscopy. She was reminded to submit to the office the Hemoccult cards for testing. We discussed importance of calcium and vitamin D and regular exercise for osteoporosis prevention. Patient declined flu vaccine today. Pap smear not done today new screening guidelines discussed.    Ok Edwards MD, 1:41 PM 11/05/2011

## 2011-11-06 LAB — COMPREHENSIVE METABOLIC PANEL
ALT: 15 U/L (ref 0–35)
AST: 14 U/L (ref 0–37)
Alkaline Phosphatase: 46 U/L (ref 39–117)
Creat: 0.68 mg/dL (ref 0.50–1.10)
Sodium: 136 mEq/L (ref 135–145)
Total Bilirubin: 0.4 mg/dL (ref 0.3–1.2)
Total Protein: 9.4 g/dL — ABNORMAL HIGH (ref 6.0–8.3)

## 2011-11-06 LAB — CBC WITH DIFFERENTIAL/PLATELET
HCT: 33.5 % — ABNORMAL LOW (ref 36.0–46.0)
Hemoglobin: 10.8 g/dL — ABNORMAL LOW (ref 12.0–15.0)
Lymphocytes Relative: 49 % — ABNORMAL HIGH (ref 12–46)
Lymphs Abs: 2.3 10*3/uL (ref 0.7–4.0)
MCHC: 32.2 g/dL (ref 30.0–36.0)
Monocytes Absolute: 0.3 10*3/uL (ref 0.1–1.0)
Monocytes Relative: 7 % (ref 3–12)
Neutro Abs: 2 10*3/uL (ref 1.7–7.7)
Neutrophils Relative %: 44 % (ref 43–77)
RBC: 4.25 MIL/uL (ref 3.87–5.11)
WBC: 4.7 10*3/uL (ref 4.0–10.5)

## 2011-11-06 LAB — URINALYSIS W MICROSCOPIC + REFLEX CULTURE
Bacteria, UA: NONE SEEN
Bilirubin Urine: NEGATIVE
Casts: NONE SEEN
Crystals: NONE SEEN
Hgb urine dipstick: NEGATIVE
Ketones, ur: NEGATIVE mg/dL
Nitrite: NEGATIVE
Specific Gravity, Urine: 1.028 (ref 1.005–1.030)
pH: 5.5 (ref 5.0–8.0)

## 2011-11-07 ENCOUNTER — Ambulatory Visit (INDEPENDENT_AMBULATORY_CARE_PROVIDER_SITE_OTHER): Payer: Self-pay | Admitting: Family

## 2011-11-07 ENCOUNTER — Encounter: Payer: Self-pay | Admitting: Family

## 2011-11-07 VITALS — BP 174/96 | HR 74 | Temp 98.6°F | Resp 16 | Ht 60.5 in | Wt 197.0 lb

## 2011-11-07 DIAGNOSIS — R7309 Other abnormal glucose: Secondary | ICD-10-CM

## 2011-11-07 DIAGNOSIS — D649 Anemia, unspecified: Secondary | ICD-10-CM

## 2011-11-07 DIAGNOSIS — I10 Essential (primary) hypertension: Secondary | ICD-10-CM

## 2011-11-07 DIAGNOSIS — R7303 Prediabetes: Secondary | ICD-10-CM

## 2011-11-07 DIAGNOSIS — M3500 Sicca syndrome, unspecified: Secondary | ICD-10-CM

## 2011-11-07 DIAGNOSIS — H409 Unspecified glaucoma: Secondary | ICD-10-CM | POA: Insufficient documentation

## 2011-11-07 DIAGNOSIS — O039 Complete or unspecified spontaneous abortion without complication: Secondary | ICD-10-CM

## 2011-11-07 DIAGNOSIS — Z23 Encounter for immunization: Secondary | ICD-10-CM

## 2011-11-07 DIAGNOSIS — M069 Rheumatoid arthritis, unspecified: Secondary | ICD-10-CM

## 2011-11-07 HISTORY — DX: Rheumatoid arthritis, unspecified: M06.9

## 2011-11-07 HISTORY — DX: Complete or unspecified spontaneous abortion without complication: O03.9

## 2011-11-07 LAB — IRON AND TIBC: TIBC: 345 ug/dL (ref 250–470)

## 2011-11-07 MED ORDER — LISINOPRIL 10 MG PO TABS: 10.0000 mg | ORAL_TABLET | Freq: Every day | ORAL | Status: DC

## 2011-11-07 MED ORDER — HYDROCHLOROTHIAZIDE 25 MG PO TABS: 25.0000 mg | ORAL_TABLET | Freq: Every day | ORAL | Status: DC

## 2011-11-07 NOTE — Progress Notes (Signed)
Subjective:    Patient ID: Amber [name], female    DOB: 1960/08/17, 51 y.o.   MRN: [id number]  HPI  Amber [name] is a 51 yr old female who presents today to establish care.  She was referred to [place] to establish care due to anemia and hyperglycemia.  1) Borderline diabetes- A1C at GYN office was 6.4. Had eye  Exam last month.  Was told that she had increased pressure and is being followed every 3 months for glaucoma.   2) Anemia-  Last hgb was 10.8, MCV was low.  Old records reviewed and note last year b12 and folate were in the normal range. Pt reports that she continues to have monthly cycles (except September).  Sometimes twice a month.  Heavy at times.    3) RA/Hx Sjogren's disease- Diagnosed a few years ago.  She was referred to rheumatologist.  She sees Dr. Dierdre Forth. She is completing a taper of prednisone due to recent flare up.  She is also on plaquenil.    4) HTN- has only been on hctz 12.5  Review of Systems  Constitutional: Negative for unexpected weight change.  HENT: Negative for ear discharge.   Eyes: Negative for visual disturbance.  Respiratory: Negative for shortness of breath.   Cardiovascular: Negative for chest pain.  Gastrointestinal: Negative for nausea, vomiting, abdominal pain and constipation.  Genitourinary: Negative for dysuria and frequency.  Musculoskeletal: Positive for arthralgias.  Skin: Negative for rash.  Neurological: Positive for headaches.  Hematological: Negative for adenopathy.  Psychiatric/Behavioral:       Denies depression/anxiety   Past Medical History  Diagnosis Date  . SAB (spontaneous abortion)   . Elective abortion   . Ectopic pregnancy     RIGHT salpingostomy  . Hypertension   . GERD (gastroesophageal reflux disease)   . Anemia   . Sjogren's disease   . IUD 09/27/2010    Mirena IUD  . Glaucoma     both eyes  . Rheumatoid arthritis 11/07/2011    History   Social History  . Marital Status: Single    Spouse Name: N/A    Number of Children: 0  . Years of Education: N/A   Occupational History  .     Social History Main Topics  . Smoking status: Never Smoker   . Smokeless tobacco: Never Used  . Alcohol Use: No  . Drug Use: No  . Sexually Active: No   Other Topics Concern  . 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  Age 75.     Past Surgical History  Procedure Date  . Dilation and curettage of uterus     X 2  . Knee surgery     MENISCUS TEAR REPAIR  . Pelvic laparoscopy 1994    IN 1999 LAPAROSCOPY WITH INCIDENTAL ENTEROTOMY REQUIRING LAPAROTOMY    Family History  Problem Relation Age of Onset  . Hypertension Mother     died from heart disease  . Heart disease Mother     deceased, unknown cause  . Diabetes Sister   . Hypertension Sister   . Diabetes Sister     No Known Allergies  Current Outpatient Prescriptions on File Prior to Visit  Medication Sig Dispense Refill  . Calcium Carbonate-Vitamin D (CALTRATE 600+D PO) Take 600 mg by mouth 2 (  two) times daily.        . ferrous sulfate 325 (65 FE) MG tablet Take 325 mg by mouth daily with breakfast.        . hydroxychloroquine (PLAQUENIL) 200 MG tablet Take by mouth 2 (two) times daily.      . [DISCONTINUED] hydrochlorothiazide (MICROZIDE) 12.5 MG capsule Take 1 capsule (12.5 mg total) by mouth daily.  30 capsule  11  . lisinopril (PRINIVIL,ZESTRIL) 10 MG tablet Take 1 tablet (10 mg total) by mouth daily.  30 tablet  0  . tranexamic acid (LYSTEDA) 650 MG TABS Take two tablets three times a day for 5 days when menses start  30 tablet  11   Current Facility-Administered Medications on File Prior to Visit  Medication Dose Route Frequency Provider Last Rate Last Dose  . levonorgestrel (MIRENA) 20 MCG/24HR IUD   Intrauterine Once Ok Edwards, MD        BP 174/96  Pulse 74  Temp 98.6 F (37 C) (Oral)  Resp 16  Ht 5' 0.5" (1.537 m)  Wt 197 lb (89.359 kg)  BMI 37.84 kg/m2  SpO2 98%       Objective:   Physical Exam  Constitutional: She is oriented to person, place, and time. She appears well-developed and well-nourished.       Pleasant black female, awake, alert and in NAD  HENT:  Head: Normocephalic and atraumatic.  Right Ear: Tympanic membrane and ear canal normal.  Left Ear: Tympanic membrane and ear canal normal.  Mouth/Throat: No posterior oropharyngeal edema or posterior oropharyngeal erythema.  Eyes: No scleral icterus.  Cardiovascular: Normal rate and regular rhythm.   No murmur heard. Pulmonary/Chest: Effort normal and breath sounds normal. No respiratory distress. She has no wheezes. She has no rales. She exhibits no tenderness.  Musculoskeletal: She exhibits no edema.  Lymphadenopathy:    She has no cervical adenopathy.  Neurological: She is alert and oriented to person, place, and time.  Skin: Skin is warm and dry.  Psychiatric: She has a normal mood and affect. Her behavior is normal. Judgment normal.          Assessment & Plan:

## 2011-11-07 NOTE — Assessment & Plan Note (Signed)
Management per rheumatology.  

## 2011-11-07 NOTE — Patient Instructions (Addendum)
Please complete lab work prior to leaving.  Start lisinopril and a baby aspirin. Increase hydrochlorothiazide to 25mg  once daily. Follow up in 2 weeks.

## 2011-11-07 NOTE — Assessment & Plan Note (Signed)
Currently completing a prednisone taper.  Management per rheumatology- Dr. Dierdre Forth.

## 2011-11-07 NOTE — Assessment & Plan Note (Signed)
Add ACE, pt reports pneumovax is up to date.  Flu shot today. Check urine microalbumin. We discussed diabetic diet, exercise and weight loss.

## 2011-11-07 NOTE — Assessment & Plan Note (Signed)
She is followed by opthalmology for this.

## 2011-11-07 NOTE — Assessment & Plan Note (Signed)
Deteriorated. Add lisinopril for BP control and renal protection.  Increase HCTZ from 12.5 to 25 mg.

## 2011-11-07 NOTE — Assessment & Plan Note (Signed)
On iron supplement. Check iron level.  Likely due to menstrual loss, however she is also scheduled with GI to help rule out GI loss as a contributing factor.

## 2011-11-08 LAB — MICROALBUMIN / CREATININE URINE RATIO: Creatinine, Urine: 187 mg/dL

## 2011-11-09 ENCOUNTER — Telehealth: Payer: Self-pay | Admitting: Family

## 2011-11-09 NOTE — Telephone Encounter (Signed)
Pls call pt and let her know that her iron is still low. I would like her to increase iron supplement to BID please.

## 2011-11-09 NOTE — Telephone Encounter (Signed)
Left message on home # to return my call. 

## 2011-11-12 ENCOUNTER — Encounter: Payer: Self-pay | Admitting: Gynecology

## 2011-11-20 ENCOUNTER — Ambulatory Visit (INDEPENDENT_AMBULATORY_CARE_PROVIDER_SITE_OTHER): Payer: Self-pay | Admitting: Family

## 2011-11-20 VITALS — BP 110/80 | HR 90 | Temp 98.3°F | Resp 16 | Ht 60.5 in | Wt 187.0 lb

## 2011-11-20 DIAGNOSIS — I10 Essential (primary) hypertension: Secondary | ICD-10-CM

## 2011-11-20 LAB — BASIC METABOLIC PANEL
BUN: 13 mg/dL (ref 6–23)
Chloride: 99 mEq/L (ref 96–112)
Potassium: 4 mEq/L (ref 3.5–5.3)
Sodium: 133 mEq/L — ABNORMAL LOW (ref 135–145)

## 2011-11-20 NOTE — Progress Notes (Signed)
Subjective:    Patient ID: Amber [name], female    DOB: January 09, 1960, 51 y.o.   MRN: [id number]  HPI  Amber [name] is a 51 yr old female who presents today for follow up.  HTN- Last visit her hctz was increased to 25mg  from 12.5 and lisinopril was added to her regimen.  Cough-Denies cold symptoms.  "feels like a bubble in my chest, then I cough and it is gone."   Review of Systems + joint pain  Past Medical History  Diagnosis Date  . SAB (spontaneous abortion)   . Elective abortion   . Ectopic pregnancy     RIGHT salpingostomy  . Hypertension   . GERD (gastroesophageal reflux disease)   . Anemia   . Sjogren's disease   . IUD 09/27/2010    Mirena IUD  . Glaucoma     both eyes  . Rheumatoid arthritis 11/07/2011    History   Social History  . Marital Status: Single    Spouse Name: N/A    Number of Children: 0  . Years of Education: N/A   Occupational History  .     Social History Main Topics  . Smoking status: Never Smoker   . Smokeless tobacco: Never Used  . Alcohol Use: No  . Drug Use: No  . Sexually Active: No   Other Topics Concern  . 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  Age 51.     Past Surgical History  Procedure Date  . Dilation and curettage of uterus     X 2  . Knee surgery     MENISCUS TEAR REPAIR  . Pelvic laparoscopy 1994    IN 1999 LAPAROSCOPY WITH INCIDENTAL ENTEROTOMY REQUIRING LAPAROTOMY    Family History  Problem Relation Age of Onset  . Hypertension Mother     died from heart disease  . Heart disease Mother     deceased, unknown cause  . Diabetes Sister   . Hypertension Sister   . Diabetes Sister     No Known Allergies  Current Outpatient Prescriptions on File Prior to Visit  Medication Sig Dispense Refill    . aspirin 81 MG tablet Take 81 mg by mouth daily.      . Calcium Carbonate-Vitamin D (CALTRATE 600+D PO) Take 600 mg by mouth 2 (two) times daily.        . ferrous sulfate 325 (65 FE) MG tablet Take 325 mg by mouth 2 (two) times daily.       . hydrochlorothiazide (HYDRODIURIL) 25 MG tablet Take 1 tablet (25 mg total) by mouth daily.  30 tablet  0  . hydroxychloroquine (PLAQUENIL) 200 MG tablet Take by mouth 2 (two) times daily.      Marland Kitchen lisinopril (PRINIVIL,ZESTRIL) 10 MG tablet Take 1 tablet (10 mg total) by mouth daily.  30 tablet  0  . predniSONE (DELTASONE) 5 MG tablet Taking dosepack.      . TRAVATAN Z 0.004 % SOLN ophthalmic solution Place 1 drop into both eyes At bedtime.       Current Facility-Administered Medications on File Prior to Visit  Medication Dose Route Frequency Provider Last Rate Last Dose  . levonorgestrel (MIRENA) 20 MCG/24HR IUD   Intrauterine Once Ok Edwards, MD        BP  110/80  Pulse 90  Temp 98.3 F (36.8 C) (Oral)  Resp 16  Ht 5' 0.5" (1.537 m)  Wt 187 lb 0.6 oz (84.841 kg)  BMI 35.93 kg/m2  SpO2 99%       Objective:   Physical Exam  Constitutional: She is oriented to person, place, and time. She appears well-developed and well-nourished. No distress.  HENT:  Head: Normocephalic and atraumatic.  Cardiovascular: Normal rate and regular rhythm.   No murmur heard. Pulmonary/Chest: Effort normal and breath sounds normal. No respiratory distress. She has no wheezes. She has no rales. She exhibits no tenderness.  Lymphadenopathy:    She has no cervical adenopathy.  Neurological: She is alert and oriented to person, place, and time.  Skin: Skin is warm and dry. No rash noted. No erythema. No pallor.  Psychiatric: She has a normal mood and affect. Her behavior is normal. Judgment and thought content normal.          Assessment & Plan:

## 2011-11-20 NOTE — Patient Instructions (Addendum)
Please complete your lab work prior to leaving. Call if cough is not resolved in 1 week or if cough worsens. Please schedule a follow up appointment in 3 months.

## 2011-11-20 NOTE — Assessment & Plan Note (Signed)
BP looks great today. We discussed possibility that her cough is due to ACE inhibitor.  She wishes to continue med for now and see how she does which is reasonable. If cough persists, plan change to ARB due to microalbuminuria. Obtain follow up BMET.

## 2011-11-21 ENCOUNTER — Ambulatory Visit (INDEPENDENT_AMBULATORY_CARE_PROVIDER_SITE_OTHER): Payer: BC Managed Care – PPO

## 2011-11-21 ENCOUNTER — Other Ambulatory Visit: Payer: Self-pay | Admitting: Gynecology

## 2011-11-21 ENCOUNTER — Encounter: Payer: Self-pay | Admitting: Family

## 2011-11-21 ENCOUNTER — Encounter: Payer: Self-pay | Admitting: Gynecology

## 2011-11-21 ENCOUNTER — Ambulatory Visit (INDEPENDENT_AMBULATORY_CARE_PROVIDER_SITE_OTHER): Payer: Self-pay | Admitting: Gynecology

## 2011-11-21 VITALS — BP 108/74

## 2011-11-21 DIAGNOSIS — T8332XA Displacement of intrauterine contraceptive device, initial encounter: Secondary | ICD-10-CM

## 2011-11-21 DIAGNOSIS — D259 Leiomyoma of uterus, unspecified: Secondary | ICD-10-CM

## 2011-11-21 DIAGNOSIS — N852 Hypertrophy of uterus: Secondary | ICD-10-CM

## 2011-11-21 DIAGNOSIS — Z975 Presence of (intrauterine) contraceptive device: Secondary | ICD-10-CM

## 2011-11-21 DIAGNOSIS — D649 Anemia, unspecified: Secondary | ICD-10-CM

## 2011-11-21 DIAGNOSIS — D219 Benign neoplasm of connective and other soft tissue, unspecified: Secondary | ICD-10-CM

## 2011-11-21 DIAGNOSIS — D252 Subserosal leiomyoma of uterus: Secondary | ICD-10-CM

## 2011-11-21 DIAGNOSIS — N942 Vaginismus: Secondary | ICD-10-CM

## 2011-11-21 DIAGNOSIS — N831 Corpus luteum cyst of ovary, unspecified side: Secondary | ICD-10-CM

## 2011-11-21 DIAGNOSIS — D251 Intramural leiomyoma of uterus: Secondary | ICD-10-CM

## 2011-11-21 NOTE — Progress Notes (Signed)
Patient is a 51 year old who was seen in the office for her annual exam on November 4 of this year. Patient has had a Mirena IUD placed last year to help with her menorrhagia and her chronic anemia. Review of her records indicated that since 2011 she had been evaluated by Dr. Mancel Bale (hematologist oncologist as well as because of her neutropenia. She is currently taking iron supplementation 3 tablets daily. She has had a history of an ultrasound the past and sonohysterogram which had demonstrated she had several fibroids the largest one measuring 4 cm in size and normal ovaries in September 2012. Review of her record also indicated the following:   1 spontaneous AB  1 elective AB  1 right ectopic resulted in salpingostomy (1994)  Laparoscopic attempt of removing ovarian cyst by another physician in [place] resulted in an incidental enterotomy regarding laparotomy  She has history of Sjogren's disease and has been followed by Dr. Dierdre Forth her rheumatologist who has her on plaquanil and prednisone.She also has had a history of hypertension and has not followup for her primary physician and she had been on HCTZ 12.5 mg daily. From her last office visit she had mentioned that her bleeding had decreased is a Mirena IUD was placed. She states sometimes as a light. Sometimes are very heavy. She was asked to return to the office today for an ultrasound to follow up on her fibroid uterus. Also during her pelvic exam somewhat difficult because some mild vaginismus. Ultrasound today demonstrated the following:  Uterus measured 10.8 x 7.3 x 6.7 cm endometrial stripe 6.0 mm. Several intramural and subserosal fibroids the largest one measuring 25 x 24 mm. The IUD was seen in the lower uterine segment the right ovary had a thin-walled cyst measuring 30 x 25 x 22 mm which appeared to be avascular but some bright echogenicity like floating debris was noted possibly hemorrhagic cyst. Left ovary appeared to be  collapsed with a measurement of 22 x 12 x 20 mm thick wall negative color flow possible corpus luteum cyst. There was no fluid in the cul-de-sac.  Patient has been adamant of proceeding with a hysterectomy for many years. She will return back to the office next week to replace the IUD under sonographic guidance to see if we can place it higher into the cavity and help with her bleeding and hopefully correct her anemia. She was once again reminded to follow up with her appointment with her gastroenterologist tomorrow for her colonoscopy. When she returns for the IUD change we will give her A shot of Depo-Provera 150 mg IM to see if this would help shrink her cyst. Will then followup with an ultrasound 3 months after the shot. If after all the above has been completed and she continues to have bleeding we had discussed office endometrial ablation via her option technique. Literature information was provided.

## 2011-11-27 ENCOUNTER — Telehealth: Payer: Self-pay | Admitting: *Deleted

## 2011-11-27 ENCOUNTER — Telehealth: Payer: Self-pay | Admitting: Gynecology

## 2011-11-27 NOTE — Telephone Encounter (Signed)
I spoke w/pt today and advised her that per her [facility] plan they consider removing and reinserting new Mirena as office surgery. She would be responsible for the  Deductible amount of $1,000 and 20% of remaining $953.00--$190.60. Her total cost is $1,193.60 includes the ultrasound guidance needed per JF, removal and insertion of new Mirena. She has appt for 12/2/813. She is speaking with triage to ck with JF concerning questions about this as she doesn't have the money to do this and will advise if we need to cancel the 12/03/11 appt/WL

## 2011-11-27 NOTE — Telephone Encounter (Signed)
Pt was advised the removal of the Mirena was covered under $20.00 copay with JF also charging for an OV. She was reminded that she has that pre-existing clause in her insurance which the claim will go through first. If they deem it preexisting it will go towards her deductible. Per Dennie Bible H her charge will be $190.00.WL

## 2011-11-27 NOTE — Telephone Encounter (Signed)
Pt informed with the below note, she just wants removal of IUD and no reinsertion for new IUD. Pt said she does not have that kind of money.

## 2011-11-27 NOTE — Telephone Encounter (Signed)
Pt is scheduled to have IUD removed with 12/03/11 replace the IUD under u/s guidance. Pt spoke with wendy and Her total cost is $1,193.60 includes the ultrasound guidance needed, removal and insertion of new Mirena. Pt said she cant afford this cost, she asked if IUD could be removed without U/S? Please advise

## 2011-11-27 NOTE — Telephone Encounter (Signed)
We can remove it with out the ultrasound it was the reinsertion to make sure we insert it high in the fundus where it will have a better effect instead of low where it has settled down to.She may want to work out some payment plan with [facility]?

## 2011-12-03 ENCOUNTER — Encounter: Payer: Self-pay | Admitting: Gynecology

## 2011-12-03 ENCOUNTER — Other Ambulatory Visit: Payer: BC Managed Care – PPO

## 2011-12-03 ENCOUNTER — Ambulatory Visit (INDEPENDENT_AMBULATORY_CARE_PROVIDER_SITE_OTHER): Payer: BC Managed Care – PPO | Admitting: Gynecology

## 2011-12-03 ENCOUNTER — Ambulatory Visit: Payer: BC Managed Care – PPO | Admitting: Gynecology

## 2011-12-03 VITALS — BP 122/78

## 2011-12-03 DIAGNOSIS — N938 Other specified abnormal uterine and vaginal bleeding: Secondary | ICD-10-CM

## 2011-12-03 DIAGNOSIS — N949 Unspecified condition associated with female genital organs and menstrual cycle: Secondary | ICD-10-CM

## 2011-12-03 DIAGNOSIS — D649 Anemia, unspecified: Secondary | ICD-10-CM

## 2011-12-03 DIAGNOSIS — D219 Benign neoplasm of connective and other soft tissue, unspecified: Secondary | ICD-10-CM

## 2011-12-03 DIAGNOSIS — D259 Leiomyoma of uterus, unspecified: Secondary | ICD-10-CM

## 2011-12-03 NOTE — Progress Notes (Signed)
Patient is a 51 year old who has had history of menorrhagia and chronic anemia as a result of leiomyomatous uteri.Review of her records indicated that since 2011 she had been evaluated by Dr. Mancel Bale (hematologist oncologist as well as because of her neutropenia. She is currently taking iron supplementation 3 tablets daily. She has had a history of an ultrasound the past and sonohysterogram which had demonstrated she had several fibroids the largest one measuring 4 cm in size and normal ovaries in September 2012. Review of her record also indicated the following:   1 spontaneous AB  1 elective AB  1 right ectopic resulted in salpingostomy (1994)  Laparoscopic attempt of removing ovarian cyst by another physician in [place] resulted in an incidental enterotomy regarding laparotomy  Ultrasound done on November 20 demonstrated a Uterus measured 10.8 x 7.3 x 6.7 cm endometrial stripe 6.0 mm. Several intramural and subserosal fibroids the largest one measuring 25 x 24 mm. The IUD was seen in the lower uterine segment the right ovary had a thin-walled cyst measuring 30 x 25 x 22 mm which appeared to be avascular but some bright echogenicity like floating debris was noted possibly hemorrhagic cyst. Left ovary appeared to be collapsed with a measurement of 22 x 12 x 20 mm thick wall negative color flow possible corpus luteum cyst. There was no fluid in the cul-de-sac.  Patient has been adamant of proceeding with a hysterectomy for many years. She was asked to return today to replace the IUD under sonographic guidance. Because her high deductible she states she wants to wait. She has not had a menstrual cycle in October and not in November. She scheduled for colonoscopy next week. She will continue on iron supplementation 3 times a day. She did receive a shot of Depo-Provera 150 mg IM last office visit in an effort to decrease the cyst seen on recent ultrasound and she'll return back to the office in 3  months for followup scan. We discussed that if her bleeding continues with the IUD and even after removal of the definitive surgery as indicated such as a hysterectomy with ovarian conservation. Patient fully understands and accepts.

## 2011-12-08 ENCOUNTER — Emergency Department (HOSPITAL_BASED_OUTPATIENT_CLINIC_OR_DEPARTMENT_OTHER)
Admission: EM | Admit: 2011-12-08 | Discharge: 2011-12-08 | Disposition: A | Discharge status: Home / Self Care | Payer: BC Managed Care – PPO | Attending: Emergency Medicine | Admitting: Emergency Medicine

## 2011-12-08 ENCOUNTER — Encounter (HOSPITAL_BASED_OUTPATIENT_CLINIC_OR_DEPARTMENT_OTHER): Payer: Self-pay | Admitting: *Deleted

## 2011-12-08 DIAGNOSIS — Z79899 Other long term (current) drug therapy: Secondary | ICD-10-CM | POA: Insufficient documentation

## 2011-12-08 DIAGNOSIS — Z7982 Long term (current) use of aspirin: Secondary | ICD-10-CM | POA: Insufficient documentation

## 2011-12-08 DIAGNOSIS — M069 Rheumatoid arthritis, unspecified: Secondary | ICD-10-CM | POA: Insufficient documentation

## 2011-12-08 DIAGNOSIS — I10 Essential (primary) hypertension: Secondary | ICD-10-CM | POA: Insufficient documentation

## 2011-12-08 DIAGNOSIS — K219 Gastro-esophageal reflux disease without esophagitis: Secondary | ICD-10-CM | POA: Insufficient documentation

## 2011-12-08 DIAGNOSIS — Z9889 Other specified postprocedural states: Secondary | ICD-10-CM | POA: Insufficient documentation

## 2011-12-08 DIAGNOSIS — M3500 Sicca syndrome, unspecified: Secondary | ICD-10-CM | POA: Insufficient documentation

## 2011-12-08 DIAGNOSIS — B029 Zoster without complications: Secondary | ICD-10-CM | POA: Insufficient documentation

## 2011-12-08 DIAGNOSIS — Z862 Personal history of diseases of the blood and blood-forming organs and certain disorders involving the immune mechanism: Secondary | ICD-10-CM | POA: Insufficient documentation

## 2011-12-08 HISTORY — DX: Personal history of other infectious and parasitic diseases: Z86.19

## 2011-12-08 MED ORDER — ACYCLOVIR 800 MG PO TABS: 800.0000 mg | ORAL_TABLET | Freq: Four times a day (QID) | ORAL | Status: DC

## 2011-12-08 MED ORDER — OXYCODONE-ACETAMINOPHEN 5-325 MG PO TABS: 2.0000 | ORAL_TABLET | ORAL | Status: DC | PRN

## 2011-12-08 NOTE — ED Notes (Signed)
Pt with painful rash on left side of hip, groin and leg x 4 days

## 2011-12-08 NOTE — ED Provider Notes (Signed)
History     CSN: [id number]  Arrival date & time 12/08/11  1726   First MD Initiated Contact with Patient 12/08/11 1835      Chief Complaint  Patient presents with  . Rash    (Consider location/radiation/quality/duration/timing/severity/associated sxs/prior treatment) Patient is a 51 y.o. female presenting with rash. The history is provided by the patient. No language interpreter was used.  Rash  This is a new problem. Episode onset: 4 days. The problem has been gradually worsening. The problem is associated with nothing. The rash is present on the abdomen and back. The pain is at a severity of 10/10. The pain is severe. The pain has been worsening since onset. Associated symptoms include blisters and pain. She has tried nothing for the symptoms. The treatment provided moderate relief.  Pt reports she thinks she has shingles  Past Medical History  Diagnosis Date  . SAB (spontaneous abortion)   . Elective abortion   . Ectopic pregnancy     RIGHT salpingostomy  . Hypertension   . GERD (gastroesophageal reflux disease)   . Anemia   . Sjogren's disease   . IUD 09/27/2010    Mirena IUD  . Glaucoma     both eyes  . Rheumatoid arthritis 11/07/2011  . History of chicken pox     Past Surgical History  Procedure Date  . Dilation and curettage of uterus     X 2  . Knee surgery     MENISCUS TEAR REPAIR  . Pelvic laparoscopy 1994    IN 1999 LAPAROSCOPY WITH INCIDENTAL ENTEROTOMY REQUIRING LAPAROTOMY  . Abdominal surgery     bowel repair    Family History  Problem Relation Age of Onset  . Hypertension Mother     died from heart disease  . Heart disease Mother     deceased, unknown cause  . Diabetes Sister   . Hypertension Sister   . Diabetes Sister     History  Substance Use Topics  . Smoking status: Never Smoker   . Smokeless tobacco: Never Used  . Alcohol Use: No    OB History    Grav Para Term Preterm Abortions TAB SAB Ect Mult Living   3 0   2   1  0       Review of Systems  Skin: Positive for rash.  All other systems reviewed and are negative.    Allergies  Review of patient's allergies indicates no known allergies.  Home Medications   Current Outpatient Rx  Name  Route  Sig  Dispense  Refill  . ASPIRIN 81 MG PO TABS   Oral   Take 81 mg by mouth daily.         Marland Kitchen CALTRATE 600+D PO   Oral   Take 600 mg by mouth 2 (two) times daily.           Marland Kitchen FERROUS SULFATE 325 (65 FE) MG PO TABS   Oral   Take 325 mg by mouth 2 (two) times daily.          Marland Kitchen HYDROCHLOROTHIAZIDE 25 MG PO TABS   Oral   Take 1 tablet (25 mg total) by mouth daily.   30 tablet   0   . HYDROXYCHLOROQUINE SULFATE 200 MG PO TABS   Oral   Take by mouth 2 (two) times daily.         Marland Kitchen LISINOPRIL 10 MG PO TABS   Oral   Take 1 tablet (10 mg total)  by mouth daily.   30 tablet   0   . TRAVATAN Z 0.004 % OP SOLN   Both Eyes   Place 1 drop into both eyes At bedtime.         Marland Kitchen PREDNISONE 5 MG PO TABS      Taking dosepack.           BP 120/69  Pulse 98  Temp 99 F (37.2 C) (Oral)  Resp 20  Ht 5\' 1"  (1.549 m)  Wt 187 lb (84.823 kg)  BMI 35.33 kg/m2  SpO2 99%  LMP 11/10/2011  Physical Exam  Nursing note and vitals reviewed. Constitutional: She appears well-developed and well-nourished.  HENT:  Head: Normocephalic.  Eyes: Conjunctivae normal and EOM are normal. Pupils are equal, round, and reactive to light.  Cardiovascular: Normal rate and normal heart sounds.   Pulmonary/Chest: Effort normal.  Abdominal: Soft.  Musculoskeletal: Normal range of motion.  Neurological: She is alert.  Skin: Rash noted. There is erythema.  Psychiatric: She has a normal mood and affect.    ED Course  Procedures (including critical care time)  Labs Reviewed - No data to display No results found.   No diagnosis found.    MDM  Pt given rx for percocet and acyclovir        Lonia Skinner [place], [place] 12/08/11 [phone]

## 2011-12-09 NOTE — ED Provider Notes (Signed)
Medical screening examination/treatment/procedure(s) were performed by non-physician practitioner and as supervising physician I was immediately available for consultation/collaboration.  Doug Sou, MD 12/09/11 [phone]

## 2011-12-10 ENCOUNTER — Other Ambulatory Visit: Payer: Self-pay | Admitting: Family

## 2012-01-15 ENCOUNTER — Encounter: Payer: Self-pay | Admitting: Gynecology

## 2012-02-18 ENCOUNTER — Ambulatory Visit: Payer: Self-pay | Admitting: Family

## 2012-02-20 ENCOUNTER — Encounter: Payer: Self-pay | Admitting: Family

## 2012-02-20 ENCOUNTER — Ambulatory Visit (INDEPENDENT_AMBULATORY_CARE_PROVIDER_SITE_OTHER): Payer: BC Managed Care – PPO | Admitting: Family

## 2012-02-20 VITALS — BP 114/72 | HR 103 | Temp 98.7°F | Resp 16 | Ht 60.5 in | Wt 189.1 lb

## 2012-02-20 DIAGNOSIS — E871 Hypo-osmolality and hyponatremia: Secondary | ICD-10-CM

## 2012-02-20 DIAGNOSIS — J322 Chronic ethmoidal sinusitis: Secondary | ICD-10-CM

## 2012-02-20 LAB — BASIC METABOLIC PANEL
CO2: 29 mEq/L (ref 19–32)
Calcium: 9.5 mg/dL (ref 8.4–10.5)
Creat: 0.74 mg/dL (ref 0.50–1.10)
Glucose, Bld: 87 mg/dL (ref 70–99)

## 2012-02-20 MED ORDER — BENZONATATE 100 MG PO CAPS: 100.0000 mg | ORAL_CAPSULE | Freq: Three times a day (TID) | ORAL | Status: DC | PRN

## 2012-02-20 MED ORDER — AMOXICILLIN-POT CLAVULANATE 875-125 MG PO TABS: 1.0000 | ORAL_TABLET | Freq: Two times a day (BID) | ORAL | Status: DC

## 2012-02-20 NOTE — Patient Instructions (Addendum)

## 2012-02-20 NOTE — Assessment & Plan Note (Signed)
Will rx with augmentin, add tessalon prn cough.

## 2012-02-20 NOTE — Progress Notes (Signed)
Subjective:    Patient ID: Amber [name], female    DOB: [birth year], 52 y.o.   MRN: [id number]  HPI  HTN- she continues lisinopril and hctz. Denies CP or SOB.  Cough- she reports that her ACE cough had stopped.  Cough started last Friday. She reports yellow green mucous.   Nasal discharge is yellow.  + sinus pressure. Using coricidin and robitussin.  No improvement. Denies associated fever.   Review of Systems    see HPI  Past Medical History  Diagnosis Date  . SAB (spontaneous abortion)   . Elective abortion   . Ectopic pregnancy     RIGHT salpingostomy  . Hypertension   . GERD (gastroesophageal reflux disease)   . Anemia   . Sjogren's disease   . IUD 09/27/2010    Mirena IUD  . Glaucoma     both eyes  . Rheumatoid arthritis 11/07/2011  . History of chicken pox     History   Social History  . Marital Status: Single    Spouse Name: N/A    Number of Children: 0  . Years of Education: N/A   Occupational History  .     Social History Main Topics  . Smoking status: Never Smoker   . Smokeless tobacco: Never Used  . Alcohol Use: No  . Drug Use: No  . Sexually Active: No   Other Topics Concern  . Not on file   Social History Narrative   Regular exercise:  No   Caffeine Use: 2 cups coffee daily   No children   "Happily divorced"   Reports that she is involved at her church   Works with intellectually challenged adults   Born in [place]- she came to [place] as adult.  Age 65.              Past Surgical History  Procedure Laterality Date  . Dilation and curettage of uterus      X 2  . Knee surgery      MENISCUS TEAR REPAIR  . Pelvic laparoscopy  1994    IN 1999 LAPAROSCOPY WITH INCIDENTAL ENTEROTOMY REQUIRING LAPAROTOMY  . Abdominal surgery      bowel repair    Family History  Problem Relation Age of Onset  . Hypertension Mother     died from heart disease  . Heart disease Mother     deceased, unknown cause  . Diabetes Sister   . Hypertension  Sister   . Diabetes Sister     No Known Allergies  Current Outpatient Prescriptions on File Prior to Visit  Medication Sig Dispense Refill  . aspirin 81 MG tablet Take 81 mg by mouth daily.      . Calcium Carbonate-Vitamin D (CALTRATE 600+D PO) Take 600 mg by mouth 2 (two) times daily.        . ferrous sulfate 325 (65 FE) MG tablet Take 325 mg by mouth 2 (two) times daily.       . hydrochlorothiazide (HYDRODIURIL) 25 MG tablet TAKE ONE TABLET BY MOUTH EVERY DAY  30 tablet  2  . hydroxychloroquine (PLAQUENIL) 200 MG tablet Take by mouth 2 (two) times daily.      Marland Kitchen lisinopril (PRINIVIL,ZESTRIL) 10 MG tablet TAKE ONE TABLET BY MOUTH EVERY DAY  30 tablet  2  . TRAVATAN Z 0.004 % SOLN ophthalmic solution Place 1 drop into both eyes At bedtime.       Current Facility-Administered Medications on File Prior to Visit  Medication  Dose Route Frequency Provider Last Rate Last Dose  . levonorgestrel (MIRENA) 20 MCG/24HR IUD   Intrauterine Once Ok Edwards, MD        BP 114/72  Pulse 103  Temp(Src) 98.7 F (37.1 C) (Oral)  Resp 16  Ht 5' 0.5" (1.537 m)  Wt 189 lb 1.3 oz (85.766 kg)  BMI 36.31 kg/m2  SpO2 99%  LMP 02/13/2012    Objective:   Physical Exam  Constitutional: She appears well-developed and well-nourished. No distress.  HENT:  Head: Normocephalic and atraumatic.  Right Ear: Tympanic membrane and ear canal normal.  Left Ear: Tympanic membrane and ear canal normal.  Mouth/Throat: No oropharyngeal exudate or posterior oropharyngeal edema.  Cardiovascular: Normal rate and regular rhythm.   No murmur heard. Pulmonary/Chest: Effort normal and breath sounds normal. No respiratory distress. She has no wheezes. She has no rales. She exhibits no tenderness.  Musculoskeletal: She exhibits no edema.  Psychiatric: She has a normal mood and affect. Her behavior is normal. Judgment and thought content normal.          Assessment & Plan:

## 2012-02-21 ENCOUNTER — Encounter: Payer: Self-pay | Admitting: Family

## 2012-02-24 ENCOUNTER — Emergency Department (HOSPITAL_COMMUNITY): Payer: BC Managed Care – PPO

## 2012-02-24 ENCOUNTER — Emergency Department (HOSPITAL_COMMUNITY)
Admission: EM | Admit: 2012-02-24 | Discharge: 2012-02-24 | Disposition: A | Discharge status: Home / Self Care | Payer: BC Managed Care – PPO | Attending: Emergency Medicine | Admitting: Emergency Medicine

## 2012-02-24 ENCOUNTER — Encounter (HOSPITAL_COMMUNITY): Payer: Self-pay | Admitting: *Deleted

## 2012-02-24 DIAGNOSIS — M25519 Pain in unspecified shoulder: Secondary | ICD-10-CM

## 2012-02-24 DIAGNOSIS — Z8719 Personal history of other diseases of the digestive system: Secondary | ICD-10-CM | POA: Insufficient documentation

## 2012-02-24 DIAGNOSIS — I10 Essential (primary) hypertension: Secondary | ICD-10-CM | POA: Insufficient documentation

## 2012-02-24 DIAGNOSIS — Z975 Presence of (intrauterine) contraceptive device: Secondary | ICD-10-CM | POA: Insufficient documentation

## 2012-02-24 DIAGNOSIS — Z8742 Personal history of other diseases of the female genital tract: Secondary | ICD-10-CM | POA: Insufficient documentation

## 2012-02-24 DIAGNOSIS — D649 Anemia, unspecified: Secondary | ICD-10-CM | POA: Insufficient documentation

## 2012-02-24 DIAGNOSIS — Z8619 Personal history of other infectious and parasitic diseases: Secondary | ICD-10-CM | POA: Insufficient documentation

## 2012-02-24 DIAGNOSIS — Z9889 Other specified postprocedural states: Secondary | ICD-10-CM | POA: Insufficient documentation

## 2012-02-24 DIAGNOSIS — H409 Unspecified glaucoma: Secondary | ICD-10-CM | POA: Insufficient documentation

## 2012-02-24 DIAGNOSIS — Z7982 Long term (current) use of aspirin: Secondary | ICD-10-CM | POA: Insufficient documentation

## 2012-02-24 DIAGNOSIS — M79603 Pain in arm, unspecified: Secondary | ICD-10-CM

## 2012-02-24 DIAGNOSIS — M3500 Sicca syndrome, unspecified: Secondary | ICD-10-CM | POA: Insufficient documentation

## 2012-02-24 DIAGNOSIS — Z79899 Other long term (current) drug therapy: Secondary | ICD-10-CM | POA: Insufficient documentation

## 2012-02-24 DIAGNOSIS — Z792 Long term (current) use of antibiotics: Secondary | ICD-10-CM | POA: Insufficient documentation

## 2012-02-24 DIAGNOSIS — R209 Unspecified disturbances of skin sensation: Secondary | ICD-10-CM | POA: Insufficient documentation

## 2012-02-24 DIAGNOSIS — M069 Rheumatoid arthritis, unspecified: Secondary | ICD-10-CM | POA: Insufficient documentation

## 2012-02-24 DIAGNOSIS — M542 Cervicalgia: Secondary | ICD-10-CM

## 2012-02-24 DIAGNOSIS — M549 Dorsalgia, unspecified: Secondary | ICD-10-CM | POA: Insufficient documentation

## 2012-02-24 DIAGNOSIS — M79609 Pain in unspecified limb: Secondary | ICD-10-CM | POA: Insufficient documentation

## 2012-02-24 MED ORDER — OXYCODONE-ACETAMINOPHEN 5-325 MG PO TABS: 1.0000 | ORAL_TABLET | ORAL | Status: DC | PRN

## 2012-02-24 MED ORDER — ONDANSETRON 4 MG PO TBDP
4.0000 mg | ORAL_TABLET | Freq: Once | ORAL | Status: AC
  Administered 2012-02-24: 4 mg via ORAL
  Filled 2012-02-24: qty 1

## 2012-02-24 MED ORDER — MELOXICAM 15 MG PO TABS: 15.0000 mg | ORAL_TABLET | Freq: Every day | ORAL | Status: DC

## 2012-02-24 MED ORDER — HYDROMORPHONE HCL PF 2 MG/ML IJ SOLN
2.0000 mg | Freq: Once | INTRAMUSCULAR | Status: AC
  Administered 2012-02-24: 2 mg via INTRAMUSCULAR
  Filled 2012-02-24: qty 1

## 2012-02-24 NOTE — ED Provider Notes (Signed)
History  This chart was scribed for non-physician practitioner working with Carleene Cooper III, MD, by Candelaria Stagers, ED Scribe. This patient was seen in room TR11C/TR11C and the patient's care was started at 3:42 PM   CSN: [id number]  Arrival date & time 02/24/12  1412   First MD Initiated Contact with Patient 02/24/12 1508      Chief Complaint  Patient presents with  . Neck Pain  . Back Pain     The history is provided by the patient. No language interpreter was used.  Amber [name] is a 52 y.o. female who presents to the Emergency Department complaining of gradual onset of intermittent aching right sided neck pain that radiates to the right shoulder blade area and under the right rib cage which started three days ago and has gradually become worse.  She has taken ibuprofen with some relief two days ago, but with no relief last night or today.  She reports she has experienced associated right hand numbness and tingling, but denies coldness to the hand.  Moving makes the pain worse.  She reports that a heating pad has improved the pain somewhat.  Pt reports she has never experienced these sx before.  Pt has h/o HTN, glaucoma, Sjogren's, and rheumatoid arthritis.  Pt has recently taken prednisone for the RA which she finished three weeks ago.     Her PCP is at [facility] in [place], [place].  Daryel Gerald, PA-C.   Past Medical History  Diagnosis Date  . SAB (spontaneous abortion)   . Elective abortion   . Ectopic pregnancy     RIGHT salpingostomy  . Hypertension   . GERD (gastroesophageal reflux disease)   . Anemia   . Sjogren's disease   . IUD 09/27/2010    Mirena IUD  . Glaucoma     both eyes  . Rheumatoid arthritis 11/07/2011  . History of chicken pox     Past Surgical History  Procedure Laterality Date  . Dilation and curettage of uterus      X 2  . Knee surgery      MENISCUS TEAR REPAIR  . Pelvic laparoscopy  1994    IN 1999 LAPAROSCOPY WITH INCIDENTAL  ENTEROTOMY REQUIRING LAPAROTOMY  . Abdominal surgery      bowel repair    Family History  Problem Relation Age of Onset  . Hypertension Mother     died from heart disease  . Heart disease Mother     deceased, unknown cause  . Diabetes Sister   . Hypertension Sister   . Diabetes Sister     History  Substance Use Topics  . Smoking status: Never Smoker   . Smokeless tobacco: Never Used  . Alcohol Use: No    OB History   Grav Para Term Preterm Abortions TAB SAB Ect Mult Living   3 0   2   1  0      Review of Systems  HENT: Positive for neck pain (right sided neck pain).   Neurological: Positive for numbness (of right hand).  All other systems reviewed and are negative.    Allergies  Review of patient's allergies indicates no known allergies.  Home Medications   Current Outpatient Rx  Name  Route  Sig  Dispense  Refill  . amoxicillin-clavulanate (AUGMENTIN) 875-125 MG per tablet   Oral   Take 1 tablet by mouth 2 (two) times daily.         Marland Kitchen aspirin 81 MG EC  tablet   Oral   Take 81 mg by mouth daily. Swallow whole.         . benzonatate (TESSALON) 100 MG capsule   Oral   Take 1 capsule (100 mg total) by mouth 3 (three) times daily as needed for cough.   20 capsule   0   . cholecalciferol (VITAMIN D) 400 UNITS TABS   Oral   Take 400 Units by mouth daily.         . ferrous sulfate 325 (65 FE) MG tablet   Oral   Take 325 mg by mouth 2 (two) times daily.          . hydrochlorothiazide (HYDRODIURIL) 25 MG tablet   Oral   Take 25 mg by mouth daily.         . hydrochlorothiazide (MICROZIDE) 12.5 MG capsule   Oral   Take 12.5 mg by mouth daily.         . hydroxychloroquine (PLAQUENIL) 200 MG tablet   Oral   Take 200 mg by mouth 2 (two) times daily.          Marland Kitchen ibuprofen (ADVIL,MOTRIN) 800 MG tablet   Oral   Take 800 mg by mouth every 8 (eight) hours as needed for pain.         Marland Kitchen lisinopril (PRINIVIL,ZESTRIL) 10 MG tablet   Oral    Take 10 mg by mouth daily.         . TRAVATAN Z 0.004 % SOLN ophthalmic solution   Both Eyes   Place 1 drop into both eyes At bedtime.         . vitamin C (ASCORBIC ACID) 500 MG tablet   Oral   Take 500 mg by mouth daily.           BP 143/75  Pulse 100  Temp(Src) 98.9 F (37.2 C) (Oral)  Resp 18  SpO2 98%  LMP 02/13/2012  Physical Exam  Nursing note and vitals reviewed. Constitutional: She is oriented to person, place, and time. She appears well-developed and well-nourished. No distress.  HENT:  Head: Normocephalic and atraumatic.  Eyes: EOM are normal.  Neck: Neck supple. No JVD present. No tracheal deviation present. No thyromegaly present.  Cardiovascular: Normal rate.   Pulmonary/Chest: Effort normal. No respiratory distress.  Musculoskeletal: Normal range of motion.  Exquisite tenderness to palpation of the cervical para spinal muscles.  ROM limited due to severity of pain.  Full strength.  5/5 grip strength bilaterally.      Neurological: She is alert and oriented to person, place, and time.  Skin: Skin is warm and dry.  Psychiatric: She has a normal mood and affect. Her behavior is normal.    ED Course  Procedures   DIAGNOSTIC STUDIES: Oxygen Saturation is 98% on room air, normal by my interpretation.    COORDINATION OF CARE:  3:35 PM Discussed course of care with pt including pain medication and xray of neck with pt at bedside.  Pt understands and agrees.      Labs Reviewed - No data to display Dg Cervical Spine Complete  02/24/2012  *RADIOLOGY REPORT*  Clinical Data: Neck pain.  CERVICAL SPINE - COMPLETE 4+ VIEW  Comparison: None.  Findings: The lateral film demonstrates normal alignment of the cervical vertebral bodies.  Disc spaces and vertebral bodies are maintained.  No acute bony findings or abnormal prevertebral soft tissue swelling.  The oblique films demonstrate normally aligned articular facets and patent neural foramen.  The  C1-C2  articulations are maintained. The lung apices are clear.  IMPRESSION: Normal alignment and no acute bony findings.   Original Report Authenticated By: Rudie Meyer, M.D.    Dg Shoulder Right  02/24/2012  *RADIOLOGY REPORT*  Clinical Data: Right shoulder pain.  RIGHT SHOULDER - 2+ VIEW  Comparison: None  Findings: The joint spaces are maintained.  No acute bony findings or significant degenerative changes.  The visualized right ribs are intact and the right lung is clear.  IMPRESSION: No acute bony findings.   Original Report Authenticated By: Rudie Meyer, M.D.      1. Neck pain   2. Shoulder pain   3. Arm pain       MDM  Patient with pain consistent with radiculopathy.  No signs of developing zoster infection.  She has good peripheral pulses and no sign of arterial occlusion.  Patient is tender to palpation of the musculature.  She has no neurologic deficits in function or sensation.  Patient has much decreased pain after administration of a medication discharge patient home with pain control she should followup with orthopedics and her primary care physician.  I personally performed the services described in this documentation, which was scribed in my presence. The recorded information has been reviewed and is accurate.        Arthor Captain, PA-C 02/25/12 0017

## 2012-02-24 NOTE — Progress Notes (Signed)
Orthopedic Tech Progress Note Patient Details:  Amber [name] 21-Jul-1960 [id number]  Ortho Devices Type of Ortho Device: Soft collar Ortho Device/Splint Location: neck Ortho Device/Splint Interventions: Application   Jennye Moccasin 02/24/2012, 5:13 PM

## 2012-02-24 NOTE — ED Notes (Signed)
Reports neck pain that started yesterday, now radiates down right arm and down her back. Ambulatory at triage, denies injury to neck or back.

## 2012-02-25 ENCOUNTER — Telehealth: Payer: Self-pay | Admitting: *Deleted

## 2012-02-25 NOTE — Telephone Encounter (Signed)
Received message from pt stating she was seen in the ER this weekend for neck, shoulder and arm pain and she is requesting appt. We do not have a 30 minute opening available until next week. Is it ok to place her in a 15 min slot?

## 2012-02-25 NOTE — Telephone Encounter (Signed)
OK 

## 2012-02-25 NOTE — Telephone Encounter (Signed)
Spoke with pt and scheduled f/u for tomorrow at 9am.

## 2012-02-25 NOTE — ED Provider Notes (Signed)
Medical screening examination/treatment/procedure(s) were performed by non-physician practitioner and as supervising physician I was immediately available for consultation/collaboration.   Carleene Cooper III, MD 02/25/12 [phone]

## 2012-02-26 ENCOUNTER — Ambulatory Visit (INDEPENDENT_AMBULATORY_CARE_PROVIDER_SITE_OTHER): Payer: BC Managed Care – PPO | Admitting: Family

## 2012-02-26 ENCOUNTER — Encounter: Payer: Self-pay | Admitting: Family

## 2012-02-26 VITALS — BP 136/80 | HR 78 | Temp 98.6°F | Resp 14 | Ht 60.5 in | Wt 192.0 lb

## 2012-02-26 DIAGNOSIS — M542 Cervicalgia: Secondary | ICD-10-CM | POA: Insufficient documentation

## 2012-02-26 MED ORDER — METHYLPREDNISOLONE 4 MG PO KIT: PACK | ORAL | Status: DC

## 2012-02-26 NOTE — Assessment & Plan Note (Signed)
Attempt medrol dose pak.  If symptoms worsen, or if symptoms do not improve, plan MRI C spine.

## 2012-02-26 NOTE — Progress Notes (Signed)
Subjective:    Patient ID: Amber [name], female    DOB: 04-03-1960, 52 y.o.   MRN: [id number]  HPI  Amber [name] is a 51 yr old female who presents today for ED follow up.  She was seen in the ED on 2/23 with complaint of R sided neck pain.  ED records are reviewed.  She reported that the neck pain radiated to the right shoulder at that time and was associated with R hand numbness.  She had x rays of C spine and right shoulder performed which were negative.  She was sent home with Rx for meloxicam and percocet.    She has not noted much improvement since she started these meds. Percocent "just makes me sleepy." Off prednisone for 3 weeks which she was taking for RA.  She continues to have numbness in the right hand with pain radiating down the right shoulder/arm.  She describes the pain as intermittent and rates the pain 8/10.    Review of Systems See HPI  Past Medical History  Diagnosis Date  . SAB (spontaneous abortion)   . Elective abortion   . Ectopic pregnancy     RIGHT salpingostomy  . Hypertension   . GERD (gastroesophageal reflux disease)   . Anemia   . Sjogren's disease   . IUD 09/27/2010    Mirena IUD  . Glaucoma     both eyes  . Rheumatoid arthritis 11/07/2011  . History of chicken pox     History   Social History  . Marital Status: Single    Spouse Name: N/A    Number of Children: 0  . Years of Education: N/A   Occupational History  .     Social History Main Topics  . Smoking status: Never Smoker   . Smokeless tobacco: Never Used  . Alcohol Use: No  . Drug Use: No  . Sexually Active: No   Other Topics Concern  . Not on file   Social History Narrative   Regular exercise:  No   Caffeine Use: 2 cups coffee daily   No children   "Happily divorced"   Reports that she is involved at her church   Works with intellectually challenged adults   Born in [place]- she came to [place] as adult.  Age 87.              Past Surgical History  Procedure  Laterality Date  . Dilation and curettage of uterus      X 2  . Knee surgery      MENISCUS TEAR REPAIR  . Pelvic laparoscopy  1994    IN 1999 LAPAROSCOPY WITH INCIDENTAL ENTEROTOMY REQUIRING LAPAROTOMY  . Abdominal surgery      bowel repair    Family History  Problem Relation Age of Onset  . Hypertension Mother     died from heart disease  . Heart disease Mother     deceased, unknown cause  . Diabetes Sister   . Hypertension Sister   . Diabetes Sister     No Known Allergies  Current Outpatient Prescriptions on File Prior to Visit  Medication Sig Dispense Refill  . amoxicillin-clavulanate (AUGMENTIN) 875-125 MG per tablet Take 1 tablet by mouth 2 (two) times daily.      Marland Kitchen aspirin 81 MG EC tablet Take 81 mg by mouth daily. Swallow whole.      . benzonatate (TESSALON) 100 MG capsule Take 1 capsule (100 mg total) by mouth 3 (three) times daily as  needed for cough.  20 capsule  0  . cholecalciferol (VITAMIN D) 400 UNITS TABS Take 400 Units by mouth daily.      . ferrous sulfate 325 (65 FE) MG tablet Take 325 mg by mouth 2 (two) times daily.       . hydrochlorothiazide (HYDRODIURIL) 25 MG tablet Take 25 mg by mouth daily.      . hydrochlorothiazide (MICROZIDE) 12.5 MG capsule Take 12.5 mg by mouth daily.      . hydroxychloroquine (PLAQUENIL) 200 MG tablet Take 200 mg by mouth 2 (two) times daily.       Marland Kitchen ibuprofen (ADVIL,MOTRIN) 800 MG tablet Take 800 mg by mouth every 8 (eight) hours as needed for pain.      Marland Kitchen lisinopril (PRINIVIL,ZESTRIL) 10 MG tablet Take 10 mg by mouth daily.      . meloxicam (MOBIC) 15 MG tablet Take 1 tablet (15 mg total) by mouth daily. Take 1 daily with food.  10 tablet  0  . oxyCODONE-acetaminophen (PERCOCET) 5-325 MG per tablet Take 1-2 tablets by mouth every 4 (four) hours as needed for pain.  10 tablet  0  . TRAVATAN Z 0.004 % SOLN ophthalmic solution Place 1 drop into both eyes At bedtime.      . vitamin C (ASCORBIC ACID) 500 MG tablet Take 500 mg by  mouth daily.       Current Facility-Administered Medications on File Prior to Visit  Medication Dose Route Frequency Provider Last Rate Last Dose  . levonorgestrel (MIRENA) 20 MCG/24HR IUD   Intrauterine Once Ok Edwards, MD        BP 136/80  Pulse 78  Temp(Src) 98.6 F (37 C) (Oral)  Resp 14  Ht 5' 0.5" (1.537 m)  Wt 192 lb 0.6 oz (87.109 kg)  BMI 36.87 kg/m2  SpO2 99%  LMP 02/13/2012       Objective:   Physical Exam  Constitutional: She is oriented to person, place, and time. She appears well-developed and well-nourished. No distress.  Cardiovascular: Normal rate and regular rhythm.   No murmur heard. Pulmonary/Chest: Effort normal and breath sounds normal. No respiratory distress. She has no wheezes. She has no rales. She exhibits no tenderness.  Musculoskeletal: She exhibits no edema.       Cervical back: She exhibits no tenderness.  Neurological: She is alert and oriented to person, place, and time.  Bilateral hand grasps 5/5, bilateral UE strength is 5/5  Psychiatric: She has a normal mood and affect. Her behavior is normal. Judgment and thought content normal.          Assessment & Plan:

## 2012-02-26 NOTE — Patient Instructions (Signed)
Please call if symptoms worsen, or if not improved in 1 week.  

## 2012-03-07 ENCOUNTER — Ambulatory Visit: Payer: BC Managed Care – PPO | Admitting: Gynecology

## 2012-03-07 ENCOUNTER — Other Ambulatory Visit: Payer: BC Managed Care – PPO

## 2012-03-24 ENCOUNTER — Ambulatory Visit (INDEPENDENT_AMBULATORY_CARE_PROVIDER_SITE_OTHER): Payer: BC Managed Care – PPO

## 2012-03-24 ENCOUNTER — Other Ambulatory Visit: Payer: Self-pay | Admitting: Gynecology

## 2012-03-24 ENCOUNTER — Encounter: Payer: Self-pay | Admitting: Gynecology

## 2012-03-24 ENCOUNTER — Ambulatory Visit (INDEPENDENT_AMBULATORY_CARE_PROVIDER_SITE_OTHER): Payer: Self-pay | Admitting: Gynecology

## 2012-03-24 VITALS — BP 126/72

## 2012-03-24 DIAGNOSIS — R102 Pelvic and perineal pain: Secondary | ICD-10-CM

## 2012-03-24 DIAGNOSIS — D3912 Neoplasm of uncertain behavior of left ovary: Secondary | ICD-10-CM

## 2012-03-24 DIAGNOSIS — N949 Unspecified condition associated with female genital organs and menstrual cycle: Secondary | ICD-10-CM

## 2012-03-24 DIAGNOSIS — N938 Other specified abnormal uterine and vaginal bleeding: Secondary | ICD-10-CM

## 2012-03-24 DIAGNOSIS — N831 Corpus luteum cyst of ovary, unspecified side: Secondary | ICD-10-CM

## 2012-03-24 DIAGNOSIS — D259 Leiomyoma of uterus, unspecified: Secondary | ICD-10-CM

## 2012-03-24 DIAGNOSIS — N83209 Unspecified ovarian cyst, unspecified side: Secondary | ICD-10-CM

## 2012-03-24 DIAGNOSIS — N83202 Unspecified ovarian cyst, left side: Secondary | ICD-10-CM | POA: Insufficient documentation

## 2012-03-24 DIAGNOSIS — D219 Benign neoplasm of connective and other soft tissue, unspecified: Secondary | ICD-10-CM

## 2012-03-24 DIAGNOSIS — N920 Excessive and frequent menstruation with regular cycle: Secondary | ICD-10-CM

## 2012-03-24 DIAGNOSIS — D649 Anemia, unspecified: Secondary | ICD-10-CM

## 2012-03-24 DIAGNOSIS — T8332XS Displacement of intrauterine contraceptive device, sequela: Secondary | ICD-10-CM

## 2012-03-24 DIAGNOSIS — R1900 Intra-abdominal and pelvic swelling, mass and lump, unspecified site: Secondary | ICD-10-CM

## 2012-03-24 DIAGNOSIS — Z5189 Encounter for other specified aftercare: Secondary | ICD-10-CM

## 2012-03-24 DIAGNOSIS — D3910 Neoplasm of uncertain behavior of unspecified ovary: Secondary | ICD-10-CM

## 2012-03-24 LAB — CBC WITH DIFFERENTIAL/PLATELET
Basophils Absolute: 0 10*3/uL (ref 0.0–0.1)
Eosinophils Relative: 2 % (ref 0–5)
HCT: 34.2 % — ABNORMAL LOW (ref 36.0–46.0)
Lymphocytes Relative: 61 % — ABNORMAL HIGH (ref 12–46)
Lymphs Abs: 2.1 10*3/uL (ref 0.7–4.0)
MCV: 82.6 fL (ref 78.0–100.0)
Monocytes Absolute: 0.3 10*3/uL (ref 0.1–1.0)
Monocytes Relative: 8 % (ref 3–12)
RBC: 4.14 MIL/uL (ref 3.87–5.11)
RDW: 14.2 % (ref 11.5–15.5)

## 2012-03-24 MED ORDER — MEGESTROL ACETATE 40 MG PO TABS: 40.0000 mg | ORAL_TABLET | Freq: Two times a day (BID) | ORAL | Status: DC

## 2012-03-24 NOTE — Patient Instructions (Signed)
Ovarian Cyst The ovaries are small organs that are on each side of the uterus. The ovaries are the organs that produce the female hormones, estrogen and progesterone. An ovarian cyst is a sac filled with fluid that can vary in its size. It is normal for a small cyst to form in women who are in the childbearing age and who have menstrual periods. This type of cyst is called a follicle cyst that becomes an ovulation cyst (corpus luteum cyst) after it produces the women's egg. It later goes away on its own if the woman does not become pregnant. There are other kinds of ovarian cysts that may cause problems and may need to be treated. The most serious problem is a cyst with cancer. It should be noted that menopausal women who have an ovarian cyst are at a higher risk of it being a cancer cyst. They should be evaluated very quickly, thoroughly and followed closely. This is especially true in menopausal women because of the high rate of ovarian cancer in women in menopause. CAUSES AND TYPES OF OVARIAN CYSTS:  FUNCTIONAL CYST: The follicle/corpus luteum cyst is a functional cyst that occurs every month during ovulation with the menstrual cycle. They go away with the next menstrual cycle if the woman does not get pregnant. Usually, there are no symptoms with a functional cyst.  ENDOMETRIOMA CYST: This cyst develops from the lining of the uterus tissue. This cyst gets in or on the ovary. It grows every month from the bleeding during the menstrual period. It is also called a "chocolate cyst" because it becomes filled with blood that turns brown. This cyst can cause pain in the lower abdomen during intercourse and with your menstrual period.  CYSTADENOMA CYST: This cyst develops from the cells on the outside of the ovary. They usually are not cancerous. They can get very big and cause lower abdomen pain and pain with intercourse. This type of cyst can twist on itself, cut off its blood supply and cause severe pain. It  also can easily rupture and cause a lot of pain.  DERMOID CYST: This type of cyst is sometimes found in both ovaries. They are found to have different kinds of body tissue in the cyst. The tissue includes skin, teeth, hair, and/or cartilage. They usually do not have symptoms unless they get very big. Dermoid cysts are rarely cancerous.  POLYCYSTIC OVARY: This is a rare condition with hormone problems that produces many small cysts on both ovaries. The cysts are follicle-like cysts that never produce an egg and become a corpus luteum. It can cause an increase in body weight, infertility, acne, increase in body and facial hair and lack of menstrual periods or rare menstrual periods. Many women with this problem develop type 2 diabetes. The exact cause of this problem is unknown. A polycystic ovary is rarely cancerous.  THECA LUTEIN CYST: Occurs when too much hormone (human chorionic gonadotropin) is produced and over-stimulates the ovaries to produce an egg. They are frequently seen when doctors stimulate the ovaries for invitro-fertilization (test tube babies).  LUTEOMA CYST: This cyst is seen during pregnancy. Rarely it can cause an obstruction to the birth canal during labor and delivery. They usually go away after delivery. SYMPTOMS   Pelvic pain or pressure.  Pain during sexual intercourse.  Increasing girth (swelling) of the abdomen.  Abnormal menstrual periods.  Increasing pain with menstrual periods.  You stop having menstrual periods and you are not pregnant. DIAGNOSIS  The diagnosis can   be made during:  Routine or annual pelvic examination (common).  Ultrasound.  X-ray of the pelvis.  CT Scan.  MRI.  Blood tests. TREATMENT   Treatment may only be to follow the cyst monthly for 2 to 3 months with your caregiver. Many go away on their own, especially functional cysts.  May be aspirated (drained) with a long needle with ultrasound, or by laparoscopy (inserting a tube into  the pelvis through a small incision).  The whole cyst can be removed by laparoscopy.  Sometimes the cyst may need to be removed through an incision in the lower abdomen.  Hormone treatment is sometimes used to help dissolve certain cysts.  Birth control pills are sometimes used to help dissolve certain cysts. HOME CARE INSTRUCTIONS  Follow your caregiver's advice regarding:  Medicine.  Follow up visits to evaluate and treat the cyst.  You may need to come back or make an appointment with another caregiver, to find the exact cause of your cyst, if your caregiver is not a gynecologist.  Get your yearly and recommended pelvic examinations and Pap tests.  Let your caregiver know if you have had an ovarian cyst in the past. SEEK MEDICAL CARE IF:   Your periods are late, irregular, they stop, or are painful.  Your stomach (abdomen) or pelvic pain does not go away.  Your stomach becomes larger or swollen.  You have pressure on your bladder or trouble emptying your bladder completely.  You have painful sexual intercourse.  You have feelings of fullness, pressure, or discomfort in your stomach.  You lose weight for no apparent reason.  You feel generally ill.  You become constipated.  You lose your appetite.  You develop acne.  You have an increase in body and facial hair.  You are gaining weight, without changing your exercise and eating habits.  You think you are pregnant. SEEK IMMEDIATE MEDICAL CARE IF:   You have increasing abdominal pain.  You feel sick to your stomach (nausea) and/or vomit.  You develop a fever that comes on suddenly.  You develop abdominal pain during a bowel movement.  Your menstrual periods become heavier than usual. Document Released: 12/18/2004 Document Revised: 03/12/2011 Document Reviewed: 10/21/2008 [facility] Patient Information 2013 [place], [place].  CA-125 Tumor Marker CA 125 is a tumor marker that is used to help monitor the  course of ovarian or endometrial cancer. PREPARATION FOR TEST No preparation is necessary. NORMAL FINDINGS Adults: 0-35 units/mL (0-35 kilounits)/L Ranges for normal findings may vary among different laboratories and hospitals. You should always check with your doctor after having lab work or other tests done to discuss the meaning of your test results and whether your values are considered within normal limits. MEANING OF TEST  Your caregiver will go over the test results with you and discuss the importance and meaning of your results, as well as treatment options and the need for additional tests if necessary. OBTAINING THE TEST RESULTS It is your responsibility to obtain your test results. Ask the lab or department performing the test when and how you will get your results. Document Released: 01/10/2004 Document Revised: 03/12/2011 Document Reviewed: 11/26/2007 [facility] Patient Information 2013 [place], [place].  Uterine Fibroid A uterine fibroid is a growth (tumor) that occurs in a woman's uterus. This type of tumor is not cancerous and does not spread out of the uterus. A woman can have one or many fibroids, and the fiboid(s) can become quite large. A fibroid can vary in size, weight, and  where it grows in the uterus. Most fibroids do not require medical treatment, but some can cause pain or heavy bleeding during and between periods. CAUSES  A fibroid is the result of a single uterine cell that keeps growing (unregulated), which is different than most cells in the human body. Most cells have a control mechanism that keeps them from reproducing without control.  SYMPTOMS   Bleeding.  Pelvic pain and pressure.  Bladder problems due to the size of the fibroid.  Infertility and miscarriages depending on the size and location of the fibroid. DIAGNOSIS  A diagnosis is made by physical exam. Your caregiver may feel the lumpy tumors during a pelvic exam. Important information regarding size,  location, and number of tumors can be gained by having an ultrasound. It is rare that other tests, such as a CT scan or MRI, are needed. TREATMENT   Your caregiver may recommend watchful waiting. This involves getting the fibroid checked by your caregiver to see if the fibroids grow or shrink.   Hormonal treatment or an intrauterine device (IUD) may be prescribed.   Surgery may be needed to remove the fibroids (myomectomy) or the uterus (hysterectomy). This depends on your situation. When fibroids interfere with fertility and a woman wants to become pregnant, a caregiver may recommend having the fibroids removed.  HOME CARE INSTRUCTIONS  Home care depends on how you were treated. In general:   Keep all follow-up appointments with your caregiver.   Only take medicine as told by your caregiver. Do not take aspirin. It can cause bleeding.   If you have excessive periods and soak tampons or pads in a half hour or less, contact your caregiver immediately. If your periods are troublesome but not so heavy, lie down with your feet raised slightly above your heart. Place cold packs on your lower abdomen.   If your periods are heavy, write down the number of pads or tampons you use per month. Bring this information to your caregiver.   Talk to your caregiver about taking iron pills.   Include green vegetables in your diet.   If you were prescribed a hormonal treatment, take the hormonal medicines as directed.   If you need surgery, ask your caregiver for information on your specific surgery.  SEEK IMMEDIATE MEDICAL CARE IF:  You have pelvic pain or cramps not controlled with medicines.   You have a sudden increase in pelvic pain.   You have an increase of bleeding between and during periods.   You feel lightheaded or have fainting episodes.  MAKE SURE YOU:  Understand these instructions.  Will watch your condition.  Will get help right away if you are not doing well or  get worse. Document Released: 12/16/1999 Document Revised: 03/12/2011 Document Reviewed: 01/08/2011 [facility] Patient Information 2013 [place], [place].  Hysterectomy Information  A hysterectomy is a procedure where your uterus is surgically removed. It will no longer be possible to have menstrual periods or to become pregnant. The tubes and ovaries can be removed (bilateral salpingo-oopherectomy) during this surgery as well.  REASONS FOR A HYSTERECTOMY  Persistent, abnormal bleeding.  Lasting (chronic) pelvic pain or infection.  The lining of the uterus (endometrium) starts growing outside the uterus (endometriosis).  The endometrium starts growing in the muscle of the uterus (adenomyosis).  The uterus falls down into the vagina (pelvic organ prolapse).  Symptomatic uterine fibroids.  Precancerous cells.  Cervical cancer or uterine cancer. TYPES OF HYSTERECTOMIES  Supracervical hysterectomy. This type removes the  top part of the uterus, but not the cervix.  Total hysterectomy. This type removes the uterus and cervix.  Radical hysterectomy. This type removes the uterus, cervix, and the fibrous tissue that holds the uterus in place in the pelvis (parametrium). WAYS A HYSTERECTOMY CAN BE PERFORMED  Abdominal hysterectomy. A large surgical cut (incision) is made in the abdomen. The uterus is removed through this incision.  Vaginal hysterectomy. An incision is made in the vagina. The uterus is removed through this incision. There are no abdominal incisions.  Conventional laparoscopic hysterectomy. A thin, lighted tube with a camera (laparoscope) is inserted into 3 or 4 small incisions in the abdomen. The uterus is cut into small pieces. The small pieces are removed through the incisions, or they are removed through the vagina.  Laparoscopic assisted vaginal hysterectomy (LAVH). Three or four small incisions are made in the abdomen. Part of the surgery is performed laparoscopically and  part vaginally. The uterus is removed through the vagina.  Robot-assisted laparoscopic hysterectomy. A laparoscope is inserted into 3 or 4 small incisions in the abdomen. A computer-controlled device is used to give the surgeon a 3D image. This allows for more precise movements of surgical instruments. The uterus is cut into small pieces and removed through the incisions or removed through the vagina. RISKS OF HYSTERECTOMY   Bleeding and risk of blood transfusion. Tell your caregiver if you do not want to receive any blood products.  Blood clots in the legs or lung.  Infection.  Injury to surrounding organs.  Anesthesia problems or side effects.  Conversion to an abdominal hysterectomy. WHAT TO EXPECT AFTER A HYSTERECTOMY  You will be given pain medicine.  You will need to have someone with you for the first 3 to 5 days after you go home.  You will need to follow up with your surgeon in 2 to 4 weeks after surgery to evaluate your progress.  You may have early menopause symptoms like hot flashes, night sweats, and insomnia.  If you had a hysterectomy for a problem that was not a cancer or a condition that could lead to cancer, then you no longer need Pap tests. However, even if you no longer need a Pap test, a regular exam is a good idea to make sure no other problems are starting. Document Released: 06/13/2000 Document Revised: 03/12/2011 Document Reviewed: 07/29/2010 [facility] Patient Information 2013 [place], [place].

## 2012-03-24 NOTE — Progress Notes (Signed)
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 Review of her records indicated that since 2011 she had been evaluated by Dr. Mancel Bale (hematologist oncologist as well as because of her neutropenia. She is currently taking iron supplementation 3 tablets daily. She has had a history of an ultrasound the past and sonohysterogram which had demonstrated she had several fibroids the largest one measuring 4 cm in size and normal ovaries in September 2012.  Review of her record also indicated the following:  1 spontaneous AB  1 elective AB  1 right ectopic resulted in salpingostomy (1994)  Laparoscopic attempt of removing ovarian cyst by another physician in [place] resulted in an incidental enterotomy regarding laparotomy  She had a followup ultrasound November of 2013 which demonstrated the following:  Uterus measured 10.8 x 7.3 x 6.7 cm endometrial stripe 6.0 mm. Several intramural and subserosal fibroids the largest one measuring 25 x 24 mm. The IUD was seen in the lower uterine segment the right ovary had a thin-walled cyst measuring 30 x 25 x 22 mm which appeared to be avascular but some bright echogenicity like floating debris was noted possibly hemorrhagic cyst. Left ovary appeared to be collapsed with a measurement of 22 x 12 x 20 mm thick wall negative color flow possible corpus luteum cyst. There was no fluid in the cul-de-sac.  She presented today but decided that she does not want a Mirena IUD replaced since she was here for the ultrasound and for followup of the ovarian cyst and to remove the IUD that was sitting in the lower uterine segment. She has informed me that since her last office she has continued with her iron supplementation 3 times daily. Ultrasound today:  Uterus measured 13.3 x 8.9 x 7.7 mm with endometrial stripe of 8.4 mm. The IUD was seen in the lower uterine segment. Right ovarian cyst resolved. Previous  has not seen. Left ovary on transvaginal and transabdominal images there was in large ovary with thick wall cystic mass measuring 5.7 x 4.3 x 5.1 cm average size 5.0 cm with reticular echo pattern. Avascular. Arterial blood flow was seen in the ovary only.  Patient's last CBC 11/05/2011 with a hemoglobin 10.8 hematocrit 33.5 with a normal platelet count 331,000.  Her cervix was cleansed with Betadine solution and the IUD was grasped retrieved shown to the patient and discarded.  Assessment for/plan: Patient with persistent menorrhagia and anemia as a result of her fibroid uterus. The Mirena IUD appears to have been pushed downward to the lower uterine segment and this is the reason that it was removed today. Patient will continue her iron supplementation 3 times a day. We'll check her CA 125 and CBC today. Patient understands the limitation of the CA 125. The appearance of the cyst cervix could be a low hemorrhagic cyst versus an endometrioma. We're going to give her a shot of Lupron 11.25 mg IM in the next few days to help stop her bleeding so that we can continue to keep her iron levels to a safe range that we can plan on doing an abdominal hysterectomy in 3 months whether or not the ovarian cyst is still present if it is present we'll plan on doing an ovarian cystectomy possible left ovarian salpingo-oophorectomy. Literature information on the above was provided. She is not sexually active.

## 2012-03-25 ENCOUNTER — Telehealth: Payer: Self-pay | Admitting: *Deleted

## 2012-03-25 ENCOUNTER — Telehealth: Payer: Self-pay | Admitting: Oncology

## 2012-03-25 ENCOUNTER — Telehealth: Payer: Self-pay

## 2012-03-25 LAB — CA 125: CA 125: 17.8 U/mL (ref 0.0–30.2)

## 2012-03-25 MED ORDER — IBUPROFEN 800 MG PO TABS: 800.0000 mg | ORAL_TABLET | Freq: Three times a day (TID) | ORAL | Status: DC | PRN

## 2012-03-25 NOTE — Telephone Encounter (Signed)
Message copied by Aura Camps on Tue Mar 25, 2012 12:07 PM ------      Message from: Keenan Bachelor      Created: Tue Mar 25, 2012 10:29 AM      Regarding: Appt W Dr. Truett Perna        Her white blood count continues  to be low and I need for you to make an appointment for her to see Dr. Tammi Sou hematologist oncologist at the [facility] who she has seen before.             Patient asked if you could ask them to schedule her after 3pm or early like 8:00 (or before 10am)            Thanks!! ------

## 2012-03-25 NOTE — Telephone Encounter (Signed)
Message copied by Aura Camps on Tue Mar 25, 2012 10:40 AM ------      Message from: Keenan Bachelor      Created: Tue Mar 25, 2012 10:29 AM      Regarding: Appt W Dr. Truett Perna        Her white blood count continues  to be low and I need for you to make an appointment for her to see Dr. Tammi Sou hematologist oncologist at the [facility] who she has seen before.             Patient asked if you could ask them to schedule her after 3pm or early like 8:00 (or before 10am)            Thanks!! ------

## 2012-03-25 NOTE — Telephone Encounter (Signed)
LVOM FOR PT TO RETURN CALL IN RE TO APPT.  °

## 2012-03-25 NOTE — Telephone Encounter (Signed)
Referral faxed to [facility], they will contact pt to schedule. 

## 2012-03-25 NOTE — Telephone Encounter (Signed)
Motrin 800mg  one PO TID PRN #[id number] reill x 3

## 2012-03-25 NOTE — Telephone Encounter (Signed)
Referral faxed to [facility] for appt. They will contact pt with time and date to schedule.

## 2012-03-25 NOTE — Telephone Encounter (Signed)
Patient picked up Megace at the pharmacy but said you had told her you would call in her Ibuprofen 800 as well but it was not there.  If okay you can refill above. Thanks

## 2012-03-28 ENCOUNTER — Telehealth: Payer: Self-pay | Admitting: Oncology

## 2012-03-28 NOTE — Telephone Encounter (Signed)
C/D 03/28/12 for appt. 04/29/12

## 2012-03-28 NOTE — Telephone Encounter (Signed)
S/W PT IN RE NP APPT 04/29 @ 1:30 W/DR. SHADAD REFERRING DR. Lars Mage FERNANDEZ DX- LOW WBC WELCOME PACKET MAILED.

## 2012-03-28 NOTE — Telephone Encounter (Signed)
appt on 04/29/12 @ 1:30 pm

## 2012-03-28 NOTE — Telephone Encounter (Signed)
Cancer left message on voicemail on 03/25/12 to call regarding appt.

## 2012-04-23 ENCOUNTER — Telehealth: Payer: Self-pay

## 2012-04-23 NOTE — Telephone Encounter (Signed)
I called patient to follow-up with her re Lupron 11.25 that Dr. Glenetta Hew ordered in March.  I had sent order in and checked with them on 03/31/12 because it had not shipped. [facility] said that Rx is ready to ship they are just waiting on okay from the patient and they had left messages.  I told patient this in voice mail and just asked her to call and follow up with me regarding.

## 2012-04-24 ENCOUNTER — Other Ambulatory Visit: Payer: Self-pay | Admitting: Oncology

## 2012-04-24 DIAGNOSIS — N920 Excessive and frequent menstruation with regular cycle: Secondary | ICD-10-CM

## 2012-04-28 ENCOUNTER — Telehealth: Payer: Self-pay | Admitting: *Deleted

## 2012-04-28 ENCOUNTER — Telehealth: Payer: Self-pay

## 2012-04-28 NOTE — Telephone Encounter (Signed)
Patient returned my call. I had called her to follow-up regarding Lupron as she has not called [facility] to give them the okay to ship her Lupron.  She called in voice mail saying that they called her home phone first and then when she called them back she got put on hold and she ended up hanging up.  She asked me to call her back.

## 2012-04-28 NOTE — Telephone Encounter (Signed)
Called pt at home and mobile #. Unable to reach pt, lmovm reminding pt of upcoming appt 04/29/12. Request call back to confirm appt.

## 2012-04-28 NOTE — Telephone Encounter (Signed)
I returned patient's call but got her voice mail.  I left message that if patient still wanted to do Lupron it is not too late. I provided her with the 1-800 number for [facility] and told her she has to be the one to okay the shipment. She will need to hold for them.  I asked her to call me and let me know what her plans were for Lupron/surgery.

## 2012-04-28 NOTE — Telephone Encounter (Signed)
Pt called lmovm confirming she will be here for appt 04/29/12

## 2012-04-29 ENCOUNTER — Telehealth: Payer: Self-pay | Admitting: Oncology

## 2012-04-29 ENCOUNTER — Other Ambulatory Visit (HOSPITAL_BASED_OUTPATIENT_CLINIC_OR_DEPARTMENT_OTHER): Payer: BC Managed Care – PPO | Admitting: Lab

## 2012-04-29 ENCOUNTER — Ambulatory Visit (HOSPITAL_BASED_OUTPATIENT_CLINIC_OR_DEPARTMENT_OTHER): Payer: BC Managed Care – PPO | Admitting: Oncology

## 2012-04-29 ENCOUNTER — Ambulatory Visit (HOSPITAL_BASED_OUTPATIENT_CLINIC_OR_DEPARTMENT_OTHER): Payer: BC Managed Care – PPO

## 2012-04-29 ENCOUNTER — Encounter: Payer: Self-pay | Admitting: Oncology

## 2012-04-29 VITALS — BP 119/63 | HR 73 | Temp 98.1°F | Resp 20 | Ht 60.5 in | Wt 189.9 lb

## 2012-04-29 DIAGNOSIS — N920 Excessive and frequent menstruation with regular cycle: Secondary | ICD-10-CM

## 2012-04-29 DIAGNOSIS — D709 Neutropenia, unspecified: Secondary | ICD-10-CM

## 2012-04-29 DIAGNOSIS — D72820 Lymphocytosis (symptomatic): Secondary | ICD-10-CM

## 2012-04-29 DIAGNOSIS — D509 Iron deficiency anemia, unspecified: Secondary | ICD-10-CM

## 2012-04-29 DIAGNOSIS — D649 Anemia, unspecified: Secondary | ICD-10-CM

## 2012-04-29 LAB — COMPREHENSIVE METABOLIC PANEL (CC13)
AST: 21 U/L (ref 5–34)
Alkaline Phosphatase: 46 U/L (ref 40–150)
BUN: 7.9 mg/dL (ref 7.0–26.0)
Creatinine: 0.8 mg/dL (ref 0.6–1.1)
Glucose: 111 mg/dl — ABNORMAL HIGH (ref 70–99)

## 2012-04-29 LAB — IRON AND TIBC
%SAT: 11 % — ABNORMAL LOW (ref 20–55)
Iron: 45 ug/dL (ref 42–145)
UIBC: 356 ug/dL (ref 125–400)

## 2012-04-29 LAB — CBC WITH DIFFERENTIAL/PLATELET
Basophils Absolute: 0 10*3/uL (ref 0.0–0.1)
EOS%: 1.7 % (ref 0.0–7.0)
Eosinophils Absolute: 0 10*3/uL (ref 0.0–0.5)
HCT: 35.8 % (ref 34.8–46.6)
HGB: 11.7 g/dL (ref 11.6–15.9)
LYMPH%: 60.6 % — ABNORMAL HIGH (ref 14.0–49.7)
MCH: 27.2 pg (ref 25.1–34.0)
MCV: 83 fL (ref 79.5–101.0)
MONO%: 6.6 % (ref 0.0–14.0)
NEUT#: 0.8 10*3/uL — ABNORMAL LOW (ref 1.5–6.5)
NEUT%: 30.5 % — ABNORMAL LOW (ref 38.4–76.8)
Platelets: 245 10*3/uL (ref 145–400)

## 2012-04-29 LAB — FERRITIN: Ferritin: 19 ng/mL (ref 10–291)

## 2012-04-29 NOTE — Telephone Encounter (Signed)
gv and printed pt appt sched and avs..MD added tx for 5.5.Marland Kitchen

## 2012-04-29 NOTE — Progress Notes (Signed)
Checked in new pt with no financial concerns. °

## 2012-04-29 NOTE — Progress Notes (Signed)
Note dictated

## 2012-04-30 ENCOUNTER — Telehealth: Payer: Self-pay | Admitting: Oncology

## 2012-04-30 NOTE — Telephone Encounter (Signed)
appt for 5/5 adjusted by . lmonvm for pt w/new time for 5/5 @ 3:15pm.

## 2012-04-30 NOTE — Progress Notes (Signed)
CC:   Sandford Craze, NP Gaetano Hawthorne. Lily Peer, M.D.  REFERRING PHYSICIAN:  Juan H. Lily Peer, M.D.  REASON FOR CONSULTATION:  Anemia and neutropenia.  HISTORY OF PRESENT ILLNESS:  [name] is a pleasant 52 year old woman, native of [place], currently lives in [place].  She works as an Public house manager and does have a past medical history significant for hypertension and Sjogren syndrome.  She also had a history of menorrhagia and followed by Dr. Reynaldo Minium.  She is currently under evaluation for possible hysterectomy due to uterine fibroids.  She has had recurrent menorrhagia and has been on iron supplements which have been helping some with modest improvement.  Her hemoglobin has ranged as low as 10.6 in 2007 to as high as 11.7 recently.  She had also hemoglobin electrophoresis back in 2007 that did not show any hemoglobinopathy. Her iron studies have also showed iron-deficiency, most recent of which in November of 2013 with iron of 30 and saturation of 9%.  Last ferritin was 8 in September 2012.  She was also noted to have mild leukopenia and neutropenia.  Her total white cell count has ranged as high as 4.7 which was normal back in November 2013 and as low as 3.2 back in 2007.  She is asymptomatic but mostly for her iron-deficiency anemia.  She reports fatigue and tiredness as well as ice craving.  She does not report any GI bleeding.  Did not report any recurrence of __________ infection. Still works full time, performs most activities of daily living without any hindrance or decline.  REVIEW OF SYSTEMS:  She did not report any headaches, blurry vision, double vision.  She did not report any motor or sensory neuropathy.  She did not report any alteration in mental status.  Does not report any psychiatric issues, depression.  Did not report any fever, chills, sweats.  Does not report any cough, hemoptysis, hematemesis.  No nausea or vomiting.  She did not report any abdominal  pain.  No hematochezia or melena.  Rest of review of systems unremarkable.  PAST MEDICAL HISTORY:  Significant for history of hypertension, GERD, Sjogren syndrome, history of glaucoma, rheumatoid arthritis, and history of chicken pox.  MEDICATIONS:  She is on aspirin, vitamin D, iron sulfate, takes it 2-3 times a day with constipation, hydrochlorothiazide.  She is on Plaquenil, Advil, __________, lisinopril, Percocet and Travatan.  ALLERGIES:  None.  SOCIAL HISTORY:  She is single.  She has no children.  She has had multiple pregnancies in the past.  Denied any alcohol or tobacco abuse.  FAMILY HISTORY:  Mother had coronary artery disease, sister has hypertension and diabetes.  PHYSICAL EXAMINATION:  Alert, awake, pleasant woman, did not appear in any active distress.  Vital signs:  Her blood pressure is 119/63, pulse 72, respirations 20, temperature is 98.1.  HEENT:  Head is normocephalic, atraumatic.  Pupils equal, round, reactive to light. Oral mucosa moist and pink.  Neck:  Supple without lymphadenopathy. Heart:  Regular rate and rhythm.  S1, S2.  Lungs:  Clear to auscultation without rhonchi, wheeze, dullness to percussion.  Abdomen:  Soft, nontender.  No hepatosplenomegaly.  Extremities:  No clubbing, cyanosis, or edema.  Neurological:  Intact motor, sensory and deep tendon reflexes.  LABORATORY DATA:  Today reviewed showed a hemoglobin 11.7, white cell count of 2.5, platelet count of 245.  Her neutrophil percentage is 30% and lymphocyte percentage around 60%, absolute neutrophil count of 800. A peripheral smear was personally reviewed today and showed clear  evidence of hypochromia and microcytosis of the red cells.  I did not see any evidence of red cell schistocytosis or fragmentation.  I could not appreciate any evidence of dysplasia or blasts.  ASSESSMENT AND PLAN:  This is a pleasant 52 year old woman with the following issues: 1. Microcytic hypochromic anemia due  to iron deficiency.  Her iron     deficiency is due to menorrhagia from uterine fibroids.  I have     talked to her about treatment options at this point including iron     supplementation through the mouth like she is doing right now which     seems to have helped her hemoglobin but she is still rather     fatigued, tired and probably iron stores still lagging at this     time.  I have also offered her an option of IV iron to fully     replace her iron stores once those are checked today.  The risks     and benefits of Feraheme were discussed.  She is willing to proceed     with that.  I told her that complications including infusion-     related toxicities, myalgias, arthralgias, very rarely anaphylaxis     and the possibility that she might need re-treatment in the future.     She is agreeable to proceed and we will go ahead with that in the     near future. 2. Neutropenia with mild lymphocytosis.  Differential diagnosis     discussed today.  This could be reactive in nature due to her iron     deficiency.  Could be also chronic fluctuating as evident of this     dating back to 2007 with the neutrophil count that ranges as low as     800, up to 2000 in November 2013.  Lymphoproliferative disorder is     also a possibility.  I will continue to monitor her counts and if     she has deterioration in her counts again, we can consider a     peripheral blood flow cytometry to rule out a lymphoproliferative     disorder.  For now I will have her come back in 2 months after IV     iron infusion.  I did discuss her laboratory data.    ______________________________ Benjiman Core, M.D. FNS/MEDQ  D:  04/29/2012  T:  04/30/2012  Job:  [id number]

## 2012-05-05 ENCOUNTER — Other Ambulatory Visit: Payer: Self-pay | Admitting: Oncology

## 2012-05-05 ENCOUNTER — Ambulatory Visit (HOSPITAL_BASED_OUTPATIENT_CLINIC_OR_DEPARTMENT_OTHER): Payer: BC Managed Care – PPO

## 2012-05-05 VITALS — BP 130/85 | HR 82 | Temp 98.5°F | Resp 20

## 2012-05-05 DIAGNOSIS — D509 Iron deficiency anemia, unspecified: Secondary | ICD-10-CM

## 2012-05-05 DIAGNOSIS — D649 Anemia, unspecified: Secondary | ICD-10-CM

## 2012-05-05 MED ORDER — SODIUM CHLORIDE 0.9 % IV SOLN
1020.0000 mg | Freq: Once | INTRAVENOUS | Status: AC
  Administered 2012-05-05: 1020 mg via INTRAVENOUS
  Filled 2012-05-05: qty 34

## 2012-05-05 MED ORDER — SODIUM CHLORIDE 0.9 % IV SOLN
Freq: Once | INTRAVENOUS | Status: AC
  Administered 2012-05-05: 16:00:00 via INTRAVENOUS

## 2012-05-05 NOTE — Progress Notes (Signed)
Patient observed for 30 minutes post feraheme infusion. Patient has no complaints or signs of reaction. Patient discharged home ambulatory.

## 2012-05-05 NOTE — Patient Instructions (Signed)
Ferumoxytol injection What is this medicine? FERUMOXYTOL is an iron complex. Iron is used to make healthy red blood cells, which carry oxygen and nutrients throughout the body. This medicine is used to treat iron deficiency anemia in people with chronic kidney disease. This medicine may be used for other purposes; ask your health care provider or pharmacist if you have questions. What should I tell my health care provider before I take this medicine? They need to know if you have any of these conditions: -anemia not caused by low iron levels -high levels of iron in the blood -magnetic resonance imaging (MRI) test scheduled -an unusual or allergic reaction to iron, other medicines, foods, dyes, or preservatives -pregnant or trying to get pregnant -breast-feeding How should I use this medicine? This medicine is for infusion into a vein. It is given by a health care professional in a hospital or clinic setting. Talk to your pediatrician regarding the use of this medicine in children. Special care may be needed. Overdosage: If you think you've taken too much of this medicine contact a poison control center or emergency room at once. Overdosage: If you think you have taken too much of this medicine contact a poison control center or emergency room at once. NOTE: This medicine is only for you. Do not share this medicine with others. What if I miss a dose? It is important not to miss your dose. Call your doctor or health care professional if you are unable to keep an appointment. What may interact with this medicine? This medicine may interact with the following medications: -other iron products This list may not describe all possible interactions. Give your health care provider a list of all the medicines, herbs, non-prescription drugs, or dietary supplements you use. Also tell them if you smoke, drink alcohol, or use illegal drugs. Some items may interact with your medicine. What should I watch  for while using this medicine? Visit your doctor or healthcare professional regularly. Tell your doctor or healthcare professional if your symptoms do not start to get better or if they get worse. You may need blood work done while you are taking this medicine. You may need to follow a special diet. Talk to your doctor. Foods that contain iron include: whole grains/cereals, dried fruits, beans, or peas, leafy green vegetables, and organ meats (liver, kidney). What side effects may I notice from receiving this medicine? Side effects that you should report to your doctor or health care professional as soon as possible: -allergic reactions like skin rash, itching or hives, swelling of the face, lips, or tongue -breathing problems -changes in blood pressure -feeling faint or lightheaded, falls -fever or chills -flushing, sweating, or hot feelings -swelling of the ankles or feet Side effects that usually do not require medical attention (Report these to your doctor or health care professional if they continue or are bothersome.): -diarrhea -headache -nausea, vomiting -stomach pain This list may not describe all possible side effects. Call your doctor for medical advice about side effects. You may report side effects to FDA at 1-800-FDA-1088. Where should I keep my medicine? This drug is given in a hospital or clinic and will not be stored at home. NOTE: This sheet is a summary. It may not cover all possible information. If you have questions about this medicine, talk to your doctor, pharmacist, or health care provider.  2013, Elsevier/Gold Standard. (09/10/2007 9:48:25 PM)  

## 2012-06-25 ENCOUNTER — Other Ambulatory Visit (HOSPITAL_BASED_OUTPATIENT_CLINIC_OR_DEPARTMENT_OTHER): Payer: BC Managed Care – PPO | Admitting: Lab

## 2012-06-25 ENCOUNTER — Ambulatory Visit (HOSPITAL_BASED_OUTPATIENT_CLINIC_OR_DEPARTMENT_OTHER): Payer: BC Managed Care – PPO | Admitting: Oncology

## 2012-06-25 ENCOUNTER — Telehealth: Payer: Self-pay | Admitting: Oncology

## 2012-06-25 VITALS — BP 139/77 | HR 79 | Temp 97.9°F | Resp 18 | Ht 60.5 in | Wt 192.1 lb

## 2012-06-25 DIAGNOSIS — D649 Anemia, unspecified: Secondary | ICD-10-CM

## 2012-06-25 LAB — COMPREHENSIVE METABOLIC PANEL (CC13)
Albumin: 3.2 g/dL — ABNORMAL LOW (ref 3.5–5.0)
BUN: 9.3 mg/dL (ref 7.0–26.0)
Calcium: 8.9 mg/dL (ref 8.4–10.4)
Chloride: 108 mEq/L — ABNORMAL HIGH (ref 98–107)
Glucose: 107 mg/dl — ABNORMAL HIGH (ref 70–99)
Potassium: 3.2 mEq/L — ABNORMAL LOW (ref 3.5–5.1)
Sodium: 136 mEq/L (ref 136–145)
Total Protein: 8.8 g/dL — ABNORMAL HIGH (ref 6.4–8.3)

## 2012-06-25 LAB — CBC WITH DIFFERENTIAL/PLATELET
Basophils Absolute: 0 10*3/uL (ref 0.0–0.1)
Eosinophils Absolute: 0 10*3/uL (ref 0.0–0.5)
HGB: 11.1 g/dL — ABNORMAL LOW (ref 11.6–15.9)
MONO#: 0.2 10*3/uL (ref 0.1–0.9)
NEUT#: 0.9 10*3/uL — ABNORMAL LOW (ref 1.5–6.5)
RBC: 3.96 10*6/uL (ref 3.70–5.45)
RDW: 14.7 % — ABNORMAL HIGH (ref 11.2–14.5)
WBC: 2.8 10*3/uL — ABNORMAL LOW (ref 3.9–10.3)
lymph#: 1.7 10*3/uL (ref 0.9–3.3)

## 2012-06-25 LAB — IRON AND TIBC
Iron: 34 ug/dL — ABNORMAL LOW (ref 42–145)
UIBC: 207 ug/dL (ref 125–400)

## 2012-06-25 NOTE — Telephone Encounter (Signed)
gv and printed appt sched and avs for pt  °

## 2012-06-25 NOTE — Progress Notes (Signed)
Hematology and Oncology Follow Up Visit  Amber [name] [id number] 01/24/1960 51 y.o. 06/25/2012 3:44 PM AmberMELISSA S., NPO'Sullivan, [name], NP   Principle Diagnosis: 52 year old woman with iron deficiency anemia as well as fluctuating reactive leukocytopenia was diagnosed in April of 2014.   Prior Therapy: She is status post IV iron infusion in the form of Feraheme given on 05/05/2012.  Current therapy: Oral iron supplements on a daily basis.  Interim History: Amber [name] presents today for a followup visit. She is a very nice woman I saw for the first time in consultation back in April of 2014. At that time she had presented with mild anemia and microcytosis and found to have iron deficiency due to  menorrhagia. She is status post IV iron as mentioned and have tolerated it very well without any complications. She is reporting some improvement in her symptoms but she still reports heavy menstrual bleeding at this time. She had not reported any recurrent sinopulmonary infection had not reported any hospitalization or illnesses. She has not been taking any oral iron since her last visit. She has not reported any chest pain or shortness of breath. Has not reported any other bleeding such as GI bleeding or GU bleeding.  Medications: I have reviewed the patient's current medications.  Current Outpatient Prescriptions  Medication Sig Dispense Refill  . aspirin 81 MG EC tablet Take 81 mg by mouth daily. Swallow whole.      . cholecalciferol (VITAMIN D) 400 UNITS TABS Take 400 Units by mouth daily.      . ferrous sulfate 325 (65 FE) MG tablet Take 325 mg by mouth 2 (two) times daily.       . hydrochlorothiazide (HYDRODIURIL) 25 MG tablet Take 25 mg by mouth daily.      . hydrochlorothiazide (MICROZIDE) 12.5 MG capsule Take 12.5 mg by mouth daily.      . hydroxychloroquine (PLAQUENIL) 200 MG tablet Take 200 mg by mouth 2 (two) times daily.       Marland Kitchen ibuprofen (ADVIL,MOTRIN) 800 MG tablet  Take 1 tablet (800 mg total) by mouth every 8 (eight) hours as needed for pain.  30 tablet  3  . lisinopril (PRINIVIL,ZESTRIL) 10 MG tablet Take 10 mg by mouth daily.      Marland Kitchen oxyCODONE-acetaminophen (PERCOCET) 5-325 MG per tablet Take 1-2 tablets by mouth every 4 (four) hours as needed for pain.  10 tablet  0  . TRAVATAN Z 0.004 % SOLN ophthalmic solution Place 1 drop into both eyes At bedtime.       Current Facility-Administered Medications  Medication Dose Route Frequency Provider Last Rate Last Dose  . levonorgestrel (MIRENA) 20 MCG/24HR IUD   Intrauterine Once Ok Edwards, MD         Allergies: No Known Allergies  Past Medical History, Surgical history, Social history, and Family History were reviewed and updated.  Review of Systems: Constitutional:  Negative for fever, chills, night sweats, anorexia, weight loss, pain. Cardiovascular: no chest pain or dyspnea on exertion Respiratory: negative Neurological: negative Dermatological: negative ENT: negative Skin: Negative. Gastrointestinal: negative Genito-Urinary: negative Hematological and Lymphatic: negative Breast: negative Musculoskeletal: negative Remaining ROS negative. Physical Exam: Blood pressure 139/77, pulse 79, temperature 97.9 F (36.6 C), temperature source Oral, resp. rate 18, height 5' 0.5" (1.537 m), weight 192 lb 1.6 oz (87.136 kg), SpO2 100.00%. ECOG: 0 General appearance: alert Head: Normocephalic, without obvious abnormality, atraumatic Neck: no adenopathy, no carotid bruit, no JVD, supple, symmetrical, trachea midline and thyroid not  enlarged, symmetric, no tenderness/mass/nodules Lymph nodes: Cervical, supraclavicular, and axillary nodes normal. Heart:regular rate and rhythm, S1, S2 normal, no murmur, click, rub or gallop Lung:chest clear, no wheezing, rales, normal symmetric air entry Abdomin: soft, non-tender, without masses or organomegaly EXT:no erythema, induration, or nodules   Lab  Results: Lab Results  Component Value Date   WBC 2.8* 06/25/2012   HGB 11.1* 06/25/2012   HCT 33.3* 06/25/2012   MCV 84.2 06/25/2012   PLT 211 06/25/2012     Chemistry      Component Value Date/Time   NA 134* 04/29/2012 1354   NA 134* 02/20/2012 0833   K 3.3* 04/29/2012 1354   K 4.0 02/20/2012 0833   CL 102 04/29/2012 1354   CL 100 02/20/2012 0833   CO2 26 04/29/2012 1354   CO2 29 02/20/2012 0833   BUN 7.9 04/29/2012 1354   BUN 12 02/20/2012 0833   CREATININE 0.8 04/29/2012 1354   CREATININE 0.74 02/20/2012 0833      Component Value Date/Time   CALCIUM 9.4 04/29/2012 1354   CALCIUM 9.5 02/20/2012 0833   ALKPHOS 46 04/29/2012 1354   ALKPHOS 46 11/05/2011 1309   AST 21 04/29/2012 1354   AST 14 11/05/2011 1309   ALT 18 04/29/2012 1354   ALT 15 11/05/2011 1309   BILITOT 0.54 04/29/2012 1354   BILITOT 0.4 11/05/2011 1309         Impression and Plan:  52 year old woman with the following issues:  1. Iron deficiency anemia due to menorrhagia: Her hemoglobin not dramatically improved with the IV iron possibly due to the fact that she still having menstrual bleeding and she probably will require more IV iron in the future. We'll continue to monitor her iron stores every 3 months I will supplement her with IV iron as needed. I also instructed her to take oral iron on a daily basis for maintenance standpoint.  2. Leukocytopenia: Her total white cell count is actually improved with an absolute neutrophil count of 900. Again this is most likely reactive in nature but we'll continue to monitor closely to make sure this is not a sign of a lymphoproliferative disorder.  3. Followup will be in 3 months time to reassess her red cell and white cell count.  [facility], MD 6/25/20143:44 PM

## 2012-08-04 ENCOUNTER — Encounter: Payer: Self-pay | Admitting: Family

## 2012-08-04 ENCOUNTER — Telehealth: Payer: Self-pay | Admitting: Family

## 2012-08-04 ENCOUNTER — Ambulatory Visit (INDEPENDENT_AMBULATORY_CARE_PROVIDER_SITE_OTHER): Payer: BC Managed Care – PPO | Admitting: Family

## 2012-08-04 VITALS — BP 126/88 | HR 97 | Temp 98.9°F | Resp 18 | Ht 65.5 in | Wt 189.0 lb

## 2012-08-04 DIAGNOSIS — J019 Acute sinusitis, unspecified: Secondary | ICD-10-CM | POA: Insufficient documentation

## 2012-08-04 DIAGNOSIS — H6692 Otitis media, unspecified, left ear: Secondary | ICD-10-CM

## 2012-08-04 DIAGNOSIS — H6690 Otitis media, unspecified, unspecified ear: Secondary | ICD-10-CM

## 2012-08-04 MED ORDER — AMOXICILLIN-POT CLAVULANATE 875-125 MG PO TABS: 1.0000 | ORAL_TABLET | Freq: Two times a day (BID) | ORAL | Status: DC

## 2012-08-04 NOTE — Assessment & Plan Note (Signed)
Will rx with augmentin.  

## 2012-08-04 NOTE — Assessment & Plan Note (Signed)
Will rx with augmentin. Recommended mucinex prn and tylenol or motrin as needed for comfort. Pt is instructed to call if symptoms worsen or if not improved in 2-3 days. She verbalizes understanding.

## 2012-08-04 NOTE — Telephone Encounter (Signed)
Patient called in stating that the work note that was given to her was written to return to work on 07/05/12. Patient would like this changed to 08/05/12

## 2012-08-04 NOTE — Telephone Encounter (Signed)
Called patient to let her know that letter is ready to pick up.

## 2012-08-04 NOTE — Progress Notes (Signed)
Subjective:    Patient ID: Amber [name], female    DOB: [birth year], 52 y.o.   MRN: [id number]  HPI  Amber [name] is a 52 yr old female who presents today with chief complaint of sinus congestion. Symptoms started 4 days ago and are associated with productive cough, yellow sinus drainage. Reports associated sinus and ear pressure.  Reports fever at home-t max 100.0. She denies sick contacts.  Has tried multiple otc preps without improvement.     Review of Systems See HPI  Past Medical History  Diagnosis Date  . SAB (spontaneous abortion)   . Elective abortion   . Ectopic pregnancy     RIGHT salpingostomy  . Hypertension   . GERD (gastroesophageal reflux disease)   . Anemia   . Sjogren's disease   . Glaucoma     both eyes  . Rheumatoid arthritis(714.0) 11/07/2011  . History of chicken pox     History   Social History  . Marital Status: Single    Spouse Name: N/A    Number of Children: 0  . Years of Education: N/A   Occupational History  .     Social History Main Topics  . Smoking status: Never Smoker   . Smokeless tobacco: Never Used  . Alcohol Use: No  . Drug Use: No  . Sexually Active: No   Other Topics Concern  . Not on file   Social History Narrative   Regular exercise:  No   Caffeine Use: 2 cups coffee daily   No children   "Happily divorced"   Reports that she is involved at her church   Works with intellectually challenged adults   Born in [place]- she came to [place] as adult.  Age 52.              Past Surgical History  Procedure Laterality Date  . Dilation and curettage of uterus      X 2  . Knee surgery      MENISCUS TEAR REPAIR  . Pelvic laparoscopy  1994    IN 1999 LAPAROSCOPY WITH INCIDENTAL ENTEROTOMY REQUIRING LAPAROTOMY  . Abdominal surgery      bowel repair    Family History  Problem Relation Age of Onset  . Hypertension Mother     died from heart disease  . Heart disease Mother     deceased, unknown cause  . Diabetes  Sister   . Hypertension Sister   . Diabetes Sister     No Known Allergies  Current Outpatient Prescriptions on File Prior to Visit  Medication Sig Dispense Refill  . aspirin 81 MG EC tablet Take 81 mg by mouth daily. Swallow whole.      . cholecalciferol (VITAMIN D) 400 UNITS TABS Take 400 Units by mouth daily.      . ferrous sulfate 325 (65 FE) MG tablet Take 325 mg by mouth daily with breakfast.       . hydrochlorothiazide (HYDRODIURIL) 25 MG tablet Take 25 mg by mouth daily.      . hydrochlorothiazide (MICROZIDE) 12.5 MG capsule Take 12.5 mg by mouth daily.      . hydroxychloroquine (PLAQUENIL) 200 MG tablet Take 200 mg by mouth 2 (two) times daily.       Marland Kitchen ibuprofen (ADVIL,MOTRIN) 800 MG tablet Take 1 tablet (800 mg total) by mouth every 8 (eight) hours as needed for pain.  30 tablet  3  . oxyCODONE-acetaminophen (PERCOCET) 5-325 MG per tablet Take 1-2 tablets by mouth  every 4 (four) hours as needed for pain.  10 tablet  0  . TRAVATAN Z 0.004 % SOLN ophthalmic solution Place 1 drop into both eyes At bedtime.       No current facility-administered medications on file prior to visit.    Ht 5' 5.5" (1.664 m)  Wt 189 lb (85.73 kg)  BMI 30.96 kg/m2  LMP 07/17/2012        Objective:   Physical Exam  Constitutional: She is oriented to person, place, and time. She appears well-developed and well-nourished. No distress.  HENT:  Head: Normocephalic and atraumatic.  Right Ear: Tympanic membrane normal.  Left Ear: Tympanic membrane is erythematous.  Mouth/Throat: No oropharyngeal exudate or posterior oropharyngeal edema.  + frontal and maxillary sinus tenderness to palpation  Cardiovascular: Normal rate and regular rhythm.   No murmur heard. Pulmonary/Chest: Effort normal and breath sounds normal. No respiratory distress. She has no wheezes. She has no rales. She exhibits no tenderness.  Neurological: She is alert and oriented to person, place, and time.  Psychiatric: She has a  normal mood and affect. Her behavior is normal. Judgment and thought content normal.          Assessment & Plan:

## 2012-08-04 NOTE — Telephone Encounter (Signed)
Corrected and print letter.

## 2012-08-04 NOTE — Patient Instructions (Addendum)
Please call if symptoms worsen or if not improved in 2-3 days.   

## 2012-08-12 LAB — HM DIABETES EYE EXAM

## 2012-09-05 ENCOUNTER — Telehealth: Payer: Self-pay | Admitting: Oncology

## 2012-09-05 NOTE — Telephone Encounter (Signed)
Moved 9/19 appt to 9/22. lmonvm for pt and mailed schedule.

## 2012-09-08 ENCOUNTER — Other Ambulatory Visit: Payer: Self-pay | Admitting: *Deleted

## 2012-09-08 NOTE — Telephone Encounter (Signed)
Faxed refill request received from pharmacy for Lisinopril 10 mg Last filled by MD on 12.09.13, #[id number] Patient reported at 08.04.14 OV that Rx was discontinued at patient preference, removed from active medication list. Last AEX - 08.04.14 Refill Denied per [facility] refill protocol via fax return/SLS

## 2012-09-19 ENCOUNTER — Ambulatory Visit: Payer: BC Managed Care – PPO | Admitting: Oncology

## 2012-09-19 ENCOUNTER — Other Ambulatory Visit: Payer: BC Managed Care – PPO | Admitting: Lab

## 2012-09-22 ENCOUNTER — Telehealth: Payer: Self-pay | Admitting: Oncology

## 2012-09-22 ENCOUNTER — Ambulatory Visit: Payer: BC Managed Care – PPO | Admitting: Oncology

## 2012-09-22 ENCOUNTER — Other Ambulatory Visit (HOSPITAL_BASED_OUTPATIENT_CLINIC_OR_DEPARTMENT_OTHER): Payer: BC Managed Care – PPO

## 2012-09-22 DIAGNOSIS — D649 Anemia, unspecified: Secondary | ICD-10-CM

## 2012-09-22 LAB — CBC WITH DIFFERENTIAL/PLATELET
Eosinophils Absolute: 0.1 10*3/uL (ref 0.0–0.5)
HCT: 32.8 % — ABNORMAL LOW (ref 34.8–46.6)
LYMPH%: 65.3 % — ABNORMAL HIGH (ref 14.0–49.7)
MCV: 84.5 fL (ref 79.5–101.0)
MONO#: 0.2 10*3/uL (ref 0.1–0.9)
MONO%: 7.9 % (ref 0.0–14.0)
NEUT#: 0.6 10*3/uL — ABNORMAL LOW (ref 1.5–6.5)
NEUT%: 23.9 % — ABNORMAL LOW (ref 38.4–76.8)
Platelets: 261 10*3/uL (ref 145–400)
RBC: 3.88 10*6/uL (ref 3.70–5.45)
WBC: 2.4 10*3/uL — ABNORMAL LOW (ref 3.9–10.3)

## 2012-09-22 LAB — COMPREHENSIVE METABOLIC PANEL (CC13)
ALT: 16 U/L (ref 0–55)
Albumin: 3.1 g/dL — ABNORMAL LOW (ref 3.5–5.0)
Alkaline Phosphatase: 45 U/L (ref 40–150)
BUN: 9 mg/dL (ref 7.0–26.0)
CO2: 25 mEq/L (ref 22–29)
Calcium: 9.1 mg/dL (ref 8.4–10.4)
Chloride: 108 mEq/L (ref 98–109)
Glucose: 90 mg/dl (ref 70–140)
Potassium: 3.8 mEq/L (ref 3.5–5.1)
Sodium: 139 mEq/L (ref 136–145)
Total Protein: 9.1 g/dL — ABNORMAL HIGH (ref 6.4–8.3)

## 2012-09-22 LAB — FERRITIN CHCC: Ferritin: 14 ng/ml (ref 9–269)

## 2012-09-22 LAB — IRON AND TIBC CHCC
Iron: 32 ug/dL — ABNORMAL LOW (ref 41–142)
TIBC: 326 ug/dL (ref 236–444)

## 2012-09-22 NOTE — Telephone Encounter (Signed)
Pt late today and ML appt reschedule to 10/3,lab was drawn today

## 2012-10-01 ENCOUNTER — Encounter: Payer: Self-pay | Admitting: Family

## 2012-10-01 ENCOUNTER — Ambulatory Visit (INDEPENDENT_AMBULATORY_CARE_PROVIDER_SITE_OTHER): Payer: BC Managed Care – PPO | Admitting: Family

## 2012-10-01 VITALS — BP 158/96 | HR 91 | Temp 98.5°F | Resp 16 | Ht 65.5 in | Wt 195.0 lb

## 2012-10-01 DIAGNOSIS — R002 Palpitations: Secondary | ICD-10-CM | POA: Insufficient documentation

## 2012-10-01 DIAGNOSIS — R9431 Abnormal electrocardiogram [ECG] [EKG]: Secondary | ICD-10-CM

## 2012-10-01 DIAGNOSIS — I10 Essential (primary) hypertension: Secondary | ICD-10-CM

## 2012-10-01 HISTORY — DX: Palpitations: R00.2

## 2012-10-01 MED ORDER — METOPROLOL TARTRATE 25 MG PO TABS: 25.0000 mg | ORAL_TABLET | Freq: Two times a day (BID) | ORAL | Status: DC

## 2012-10-01 NOTE — Assessment & Plan Note (Signed)
EKG is reviewed. NSR is noted. No old EKG available for comparison.  Note is made of anterolateral ST-elevation- repolarization variant.  Also ? Old anterior infarct.  No chest pain.  She does reports some mild DOE.  She is noted to be euvolemic on exam today.  Repeat K+ today, check CBC, TSH.  Will refer for stress test. Add beta blocker for BP control and rate control. If symptoms are not resolved when she returns in 2 weeks, then will also send for holter monitor.  Declines flu shot today.

## 2012-10-01 NOTE — Progress Notes (Signed)
Subjective:    Patient ID: Amber [name], female    DOB: [birth year], 52 y.o.   MRN: [id number]  HPI  Ms.  [name] is a 52 yr old female who presents today with chief complaint of palpitations.  Started about 1 week ago, but becoming more frequent in the last 2 days.  She reports that symptoms can last a few minutes. Reports that she had someone check her heart rated during one of these episodes and rate was 102.  She denies associated chest pain.  She reports that she becomes more short of breath when she exerts herself this past week.    Review of Systems See HPI  Past Medical History  Diagnosis Date  . SAB (spontaneous abortion)   . Elective abortion   . Ectopic pregnancy     RIGHT salpingostomy  . Hypertension   . GERD (gastroesophageal reflux disease)   . Anemia   . Sjogren's disease   . Glaucoma     both eyes  . Rheumatoid arthritis(714.0) 11/07/2011  . History of chicken pox     History   Social History  . Marital Status: Single    Spouse Name: N/A    Number of Children: 0  . Years of Education: N/A   Occupational History  .     Social History Main Topics  . Smoking status: Never Smoker   . Smokeless tobacco: Never Used  . Alcohol Use: No  . Drug Use: No  . Sexual Activity: No   Other Topics Concern  . Not on file   Social History Narrative   Regular exercise:  No   Caffeine Use: 2 cups coffee daily   No children   "Happily divorced"   Reports that she is involved at her church   Works with intellectually challenged adults   Born in [place]- she came to [place] as adult.  Age 89.              Past Surgical History  Procedure Laterality Date  . Dilation and curettage of uterus      X 2  . Knee surgery      MENISCUS TEAR REPAIR  . Pelvic laparoscopy  1994    IN 1999 LAPAROSCOPY WITH INCIDENTAL ENTEROTOMY REQUIRING LAPAROTOMY  . Abdominal surgery      bowel repair    Family History  Problem Relation Age of Onset  . Hypertension Mother    died from heart disease  . Heart disease Mother     deceased, unknown cause  . Diabetes Sister   . Hypertension Sister   . Diabetes Sister     No Known Allergies  Current Outpatient Prescriptions on File Prior to Visit  Medication Sig Dispense Refill  . aspirin 81 MG EC tablet Take 81 mg by mouth daily. Swallow whole.      . cholecalciferol (VITAMIN D) 400 UNITS TABS Take 400 Units by mouth daily.      . ferrous sulfate 325 (65 FE) MG tablet Take 325 mg by mouth daily with breakfast.       . hydrochlorothiazide (HYDRODIURIL) 25 MG tablet Take 25 mg by mouth daily.      . hydrochlorothiazide (MICROZIDE) 12.5 MG capsule Take 12.5 mg by mouth daily.      . hydroxychloroquine (PLAQUENIL) 200 MG tablet Take 200 mg by mouth 2 (two) times daily.       Marland Kitchen ibuprofen (ADVIL,MOTRIN) 800 MG tablet Take 1 tablet (800 mg total) by mouth every 8 (  eight) hours as needed for pain.  30 tablet  3  . TRAVATAN Z 0.004 % SOLN ophthalmic solution Place 1 drop into both eyes At bedtime.       No current facility-administered medications on file prior to visit.    BP 158/96  Pulse 91  Temp(Src) 98.5 F (36.9 C) (Oral)  Resp 16  Ht 5' 5.5" (1.664 m)  Wt 195 lb 0.6 oz (88.47 kg)  BMI 31.95 kg/m2  SpO2 99%       Objective:   Physical Exam  Constitutional: She is oriented to person, place, and time. She appears well-developed and well-nourished. No distress.  HENT:  Head: Normocephalic and atraumatic.  Cardiovascular: Normal rate and regular rhythm.   No murmur heard. Pulmonary/Chest: Effort normal and breath sounds normal. No respiratory distress. She has no wheezes. She has no rales. She exhibits no tenderness.  Musculoskeletal: She exhibits no edema.  Neurological: She is alert and oriented to person, place, and time.  Psychiatric: She has a normal mood and affect. Her behavior is normal. Judgment and thought content normal.          Assessment & Plan:

## 2012-10-01 NOTE — Patient Instructions (Addendum)
Please complete lab work prior to leaving. You will be contacted about your referral for stress test. Call if symptoms worsen, or if symptoms do not improve. Follow up in 2 weeks.

## 2012-10-01 NOTE — Assessment & Plan Note (Signed)
Deteriorated.  Continue hctz, add metoprolol. Follow up in 2 weeks.

## 2012-10-02 LAB — BASIC METABOLIC PANEL WITH GFR
BUN: 9 mg/dL (ref 6–23)
CO2: 28 mEq/L (ref 19–32)
Calcium: 9.7 mg/dL (ref 8.4–10.5)
Creat: 0.63 mg/dL (ref 0.50–1.10)
Glucose, Bld: 87 mg/dL (ref 70–99)
Sodium: 134 mEq/L — ABNORMAL LOW (ref 135–145)

## 2012-10-02 LAB — CBC WITH DIFFERENTIAL/PLATELET
Basophils Absolute: 0 10*3/uL (ref 0.0–0.1)
Basophils Relative: 1 % (ref 0–1)
Eosinophils Absolute: 0.1 10*3/uL (ref 0.0–0.7)
Eosinophils Relative: 3 % (ref 0–5)
Lymphs Abs: 1.6 10*3/uL (ref 0.7–4.0)
MCH: 26.7 pg (ref 26.0–34.0)
MCHC: 32.5 g/dL (ref 30.0–36.0)
MCV: 82.1 fL (ref 78.0–100.0)
Neutro Abs: 0.7 10*3/uL — ABNORMAL LOW (ref 1.7–7.7)
Platelets: 313 10*3/uL (ref 150–400)
RBC: 4.08 MIL/uL (ref 3.87–5.11)
RDW: 13.9 % (ref 11.5–15.5)

## 2012-10-02 LAB — PATHOLOGIST SMEAR REVIEW

## 2012-10-03 ENCOUNTER — Encounter: Payer: Self-pay | Admitting: Oncology

## 2012-10-03 ENCOUNTER — Ambulatory Visit (HOSPITAL_BASED_OUTPATIENT_CLINIC_OR_DEPARTMENT_OTHER): Payer: BC Managed Care – PPO

## 2012-10-03 ENCOUNTER — Ambulatory Visit (HOSPITAL_BASED_OUTPATIENT_CLINIC_OR_DEPARTMENT_OTHER): Payer: BC Managed Care – PPO | Admitting: Oncology

## 2012-10-03 ENCOUNTER — Telehealth: Payer: Self-pay | Admitting: Oncology

## 2012-10-03 ENCOUNTER — Encounter: Payer: Self-pay | Admitting: Family

## 2012-10-03 VITALS — BP 147/82 | HR 66 | Temp 98.7°F | Resp 18 | Ht 65.0 in | Wt 195.5 lb

## 2012-10-03 DIAGNOSIS — D72819 Decreased white blood cell count, unspecified: Secondary | ICD-10-CM

## 2012-10-03 DIAGNOSIS — D649 Anemia, unspecified: Secondary | ICD-10-CM

## 2012-10-03 DIAGNOSIS — D509 Iron deficiency anemia, unspecified: Secondary | ICD-10-CM

## 2012-10-03 MED ORDER — SODIUM CHLORIDE 0.9 % IV SOLN
1020.0000 mg | Freq: Once | INTRAVENOUS | Status: AC
  Administered 2012-10-03: 1020 mg via INTRAVENOUS
  Filled 2012-10-03: qty 34

## 2012-10-03 NOTE — Telephone Encounter (Signed)
lvm for pt regarding to Jan 2015 appt...mailed pt avs and letter °

## 2012-10-03 NOTE — Progress Notes (Signed)
Hematology and Oncology Follow Up Visit  Amber [name] [id number] 01-Oct-1960 52 y.o. 10/03/2012 2:07 PM Lemont Fillers., NPO'Sullivan, [name], NP   Principle Diagnosis: 52 year old woman with iron deficiency anemia as well as fluctuating reactive leukocytopenia was diagnosed in April of 2014.   Prior Therapy: She is status post IV iron infusion in the form of Feraheme given on 05/05/2012.  Current therapy: Oral iron supplements on a daily basis.  Interim History: Amber [name] presents today for a followup visit. In April 2014, she had presented with mild anemia and microcytosis and found to have iron deficiency due to  menorrhagia. She is status post IV iron as mentioned and have tolerated it very well without any complications. She is reporting some improvement in her symptoms but she still has heavy menstrual bleeding at this time. She had not reported any recurrent infections had not reported any hospitalization or illnesses. She reports that she is taking her oral iron daily. She has not reported any chest pain or shortness of breath. Has not reported any other bleeding such as GI bleeding or GU bleeding.  Medications: I have reviewed the patient's current medications.  Current Outpatient Prescriptions  Medication Sig Dispense Refill  . aspirin 81 MG EC tablet Take 81 mg by mouth daily. Swallow whole.      . cholecalciferol (VITAMIN D) 400 UNITS TABS Take 400 Units by mouth daily.      . ferrous sulfate 325 (65 FE) MG tablet Take 325 mg by mouth daily with breakfast.       . hydrochlorothiazide (HYDRODIURIL) 25 MG tablet Take 25 mg by mouth daily.      . hydrochlorothiazide (MICROZIDE) 12.5 MG capsule Take 12.5 mg by mouth daily.      . hydroxychloroquine (PLAQUENIL) 200 MG tablet Take 200 mg by mouth 2 (two) times daily.       Marland Kitchen ibuprofen (ADVIL,MOTRIN) 800 MG tablet Take 1 tablet (800 mg total) by mouth every 8 (eight) hours as needed for pain.  30 tablet  3  . metoprolol  tartrate (LOPRESSOR) 25 MG tablet Take 1 tablet (25 mg total) by mouth 2 (two) times daily.  60 tablet  0  . TRAVATAN Z 0.004 % SOLN ophthalmic solution Place 1 drop into both eyes At bedtime.       No current facility-administered medications for this visit.     Allergies: No Known Allergies  Past Medical History, Surgical history, Social history, and Family History were reviewed and updated.  Review of Systems: Constitutional:  Negative for fever, chills, night sweats, anorexia, weight loss, pain. Cardiovascular: no chest pain or dyspnea on exertion Respiratory: negative Neurological: negative Dermatological: negative ENT: negative Skin: Negative. Gastrointestinal: negative Genito-Urinary: negative Hematological and Lymphatic: negative Breast: negative Musculoskeletal: negative Remaining ROS negative.  Physical Exam: Blood pressure 147/82, pulse 66, temperature 98.7 F (37.1 C), temperature source Oral, resp. rate 18, height 5\' 5"  (1.651 m), weight 195 lb 8 oz (88.678 kg), SpO2 99.00%. ECOG: 0 General appearance: alert Head: Normocephalic, without obvious abnormality, atraumatic Neck: no adenopathy, no carotid bruit, no JVD, supple, symmetrical, trachea midline and thyroid not enlarged, symmetric, no tenderness/mass/nodules Lymph nodes: Cervical, supraclavicular, and axillary nodes normal. Heart:regular rate and rhythm, S1, S2 normal, no murmur, click, rub or gallop Lung:chest clear, no wheezing, rales, normal symmetric air entry Abdomen: soft, non-tender, without masses or organomegaly EXT:no erythema, induration, or nodules   Lab Results: Lab Results  Component Value Date   WBC 2.6* 10/01/2012   HGB 10.9*  10/01/2012   HCT 33.5* 10/01/2012   MCV 82.1 10/01/2012   PLT 313 10/01/2012     Chemistry      Component Value Date/Time   NA 134* 10/01/2012 1412   NA 139 09/22/2012 0933   K 3.9 10/01/2012 1412   K 3.8 09/22/2012 0933   CL 104 10/01/2012 1412   CL 108* 06/25/2012  1508   CO2 28 10/01/2012 1412   CO2 25 09/22/2012 0933   BUN 9 10/01/2012 1412   BUN 9.0 09/22/2012 0933   CREATININE 0.63 10/01/2012 1412   CREATININE 0.8 09/22/2012 0933      Component Value Date/Time   CALCIUM 9.7 10/01/2012 1412   CALCIUM 9.1 09/22/2012 0933   ALKPHOS 45 09/22/2012 0933   ALKPHOS 46 11/05/2011 1309   AST 17 09/22/2012 0933   AST 14 11/05/2011 1309   ALT 16 09/22/2012 0933   ALT 15 11/05/2011 1309   BILITOT 0.35 09/22/2012 0933   BILITOT 0.4 11/05/2011 1309     Ferritin - 14 on 09/22/12  Impression and Plan:  52 year old woman with the following issues:  1. Iron deficiency anemia due to menorrhagia: Her hemoglobin is drifting down and ferritin is at the low end of normal. Discussed repeating Feraheme today and she is agreeable. I also instructed her to take oral iron on a daily basis for maintenance standpoint.  2. Leukocytopenia: Her total white cell count is actually improved with an absolute neutrophil count of 700. Again this is most likely reactive in nature but we will continue to monitor closely to make sure this is not a sign of a lymphoproliferative disorder.  3. Followup will be in 3-4 months time to reassess her red cell and white cell count.  Amber [name], Amber [name] 10/3/20142:07 PM

## 2012-10-03 NOTE — Patient Instructions (Addendum)
Ferumoxytol injection What is this medicine? FERUMOXYTOL is an iron complex. Iron is used to make healthy red blood cells, which carry oxygen and nutrients throughout the body. This medicine is used to treat iron deficiency anemia in people with chronic kidney disease. This medicine may be used for other purposes; ask your health care provider or pharmacist if you have questions. What should I tell my health care provider before I take this medicine? They need to know if you have any of these conditions: -anemia not caused by low iron levels -high levels of iron in the blood -magnetic resonance imaging (MRI) test scheduled -an unusual or allergic reaction to iron, other medicines, foods, dyes, or preservatives -pregnant or trying to get pregnant -breast-feeding How should I use this medicine? This medicine is for infusion into a vein. It is given by a health care professional in a hospital or clinic setting. Talk to your pediatrician regarding the use of this medicine in children. Special care may be needed. Overdosage: If you think you've taken too much of this medicine contact a poison control center or emergency room at once. Overdosage: If you think you have taken too much of this medicine contact a poison control center or emergency room at once. NOTE: This medicine is only for you. Do not share this medicine with others. What if I miss a dose? It is important not to miss your dose. Call your doctor or health care professional if you are unable to keep an appointment. What may interact with this medicine? This medicine may interact with the following medications: -other iron products This list may not describe all possible interactions. Give your health care provider a list of all the medicines, herbs, non-prescription drugs, or dietary supplements you use. Also tell them if you smoke, drink alcohol, or use illegal drugs. Some items may interact with your medicine. What should I watch  for while using this medicine? Visit your doctor or healthcare professional regularly. Tell your doctor or healthcare professional if your symptoms do not start to get better or if they get worse. You may need blood work done while you are taking this medicine. You may need to follow a special diet. Talk to your doctor. Foods that contain iron include: whole grains/cereals, dried fruits, beans, or peas, leafy green vegetables, and organ meats (liver, kidney). What side effects may I notice from receiving this medicine? Side effects that you should report to your doctor or health care professional as soon as possible: -allergic reactions like skin rash, itching or hives, swelling of the face, lips, or tongue -breathing problems -changes in blood pressure -feeling faint or lightheaded, falls -fever or chills -flushing, sweating, or hot feelings -swelling of the ankles or feet Side effects that usually do not require medical attention (Report these to your doctor or health care professional if they continue or are bothersome.): -diarrhea -headache -nausea, vomiting -stomach pain This list may not describe all possible side effects. Call your doctor for medical advice about side effects. You may report side effects to FDA at 1-800-FDA-1088. Where should I keep my medicine? This drug is given in a hospital or clinic and will not be stored at home. NOTE: This sheet is a summary. It may not cover all possible information. If you have questions about this medicine, talk to your doctor, pharmacist, or health care provider.  2013, Elsevier/Gold Standard. (09/10/2007 9:48:25 PM)  

## 2012-10-06 ENCOUNTER — Telehealth: Payer: Self-pay | Admitting: *Deleted

## 2012-10-06 MED ORDER — LISINOPRIL 10 MG PO TABS: 10.0000 mg | ORAL_TABLET | Freq: Every day | ORAL | Status: DC

## 2012-10-06 NOTE — Telephone Encounter (Signed)
Received call from pt wanting to know why we denied refill request for Lisinopril. Advised pt that per 08/04/12 office note pt stated she was not taking lisinopril. Pt states she was told to continue all  blood pressure meds (lisinopril and hctz) at that visit.  Medication added back to med list and refill sent.

## 2012-10-15 ENCOUNTER — Ambulatory Visit (HOSPITAL_COMMUNITY): Payer: BC Managed Care – PPO | Attending: Cardiology | Admitting: Radiology

## 2012-10-15 ENCOUNTER — Ambulatory Visit: Payer: BC Managed Care – PPO | Admitting: Family

## 2012-10-15 VITALS — BP 115/73 | Ht 66.0 in | Wt 193.0 lb

## 2012-10-15 DIAGNOSIS — R079 Chest pain, unspecified: Secondary | ICD-10-CM

## 2012-10-15 DIAGNOSIS — R9439 Abnormal result of other cardiovascular function study: Secondary | ICD-10-CM | POA: Insufficient documentation

## 2012-10-15 DIAGNOSIS — Z8249 Family history of ischemic heart disease and other diseases of the circulatory system: Secondary | ICD-10-CM | POA: Insufficient documentation

## 2012-10-15 DIAGNOSIS — R9431 Abnormal electrocardiogram [ECG] [EKG]: Secondary | ICD-10-CM | POA: Insufficient documentation

## 2012-10-15 DIAGNOSIS — R0609 Other forms of dyspnea: Secondary | ICD-10-CM | POA: Insufficient documentation

## 2012-10-15 DIAGNOSIS — R002 Palpitations: Secondary | ICD-10-CM | POA: Insufficient documentation

## 2012-10-15 DIAGNOSIS — I10 Essential (primary) hypertension: Secondary | ICD-10-CM | POA: Insufficient documentation

## 2012-10-15 DIAGNOSIS — R0989 Other specified symptoms and signs involving the circulatory and respiratory systems: Secondary | ICD-10-CM | POA: Insufficient documentation

## 2012-10-15 MED ORDER — TECHNETIUM TC 99M SESTAMIBI GENERIC - CARDIOLITE
10.0000 | Freq: Once | INTRAVENOUS | Status: AC | PRN
  Administered 2012-10-15: 10 via INTRAVENOUS

## 2012-10-15 MED ORDER — TECHNETIUM TC 99M SESTAMIBI GENERIC - CARDIOLITE
30.0000 | Freq: Once | INTRAVENOUS | Status: AC | PRN
  Administered 2012-10-15: 30 via INTRAVENOUS

## 2012-10-15 NOTE — Progress Notes (Signed)
[facility] SITE 3 NUCLEAR MED [address] [place], [place] [address] [phone]    Cardiology Nuclear Med Study  Amber [name] is a 52 y.o. female     MRN : [id number]     DOB: September 05, 1960  Procedure Date: 10/15/2012  Nuclear Med Background Indication for Stress Test:  Evaluation for Ischemia and Abnormal EKG History:  Abnormal EKG Cardiac Risk Factors: Family History - CAD and Hypertension  Symptoms:  DOE and Palpitations   Nuclear Pre-Procedure Caffeine/Decaff Intake:  None >12 hours NPO After: 7:30pm   Lungs:  clear O2 Sat: 99% on room air. IV 0.9% NS with Angio Cath:  22g  IV Site: R Antecubital  IV Started by:  Rickard Patience  Chest Size (in):  36 Cup Size: B  Height: 5\' 6"  (1.676 m)  Weight:  193 lb (87.544 kg)  BMI:  Body mass index is 31.17 kg/(m^2). Tech Comments:  Held Licensed conveyancer Med Study 1 or 2 day study: 1 day  Stress Test Type:  Stress  Reading MD: Marca Ancona, MD  Order Authorizing Provider:  Reuel Derby, MD and Daryel Gerald, NP  Resting Radionuclide: Technetium 41m Sestamibi  Resting Radionuclide Dose: 11.0 mCi   Stress Radionuclide:  Technetium 37m Sestamibi  Stress Radionuclide Dose: 33.0 mCi           Stress Protocol Rest HR: 78 Stress HR: 155  Rest BP: 115/73 Stress BP: 186/76  Exercise Time (min): 5:01 METS: 7.0   Predicted Max HR: 169 bpm % Max HR: 91.72 bpm Rate Pressure Product: 03474   Dose of Adenosine (mg):  n/a Dose of Lexiscan: n/a mg  Dose of Atropine (mg): n/a Dose of Dobutamine: n/a mcg/kg/min (at max HR)  Stress Test Technologist: Milana Na, EMT-P  Nuclear Technologist:  Doyne Keel, CNMT     Rest Procedure:  Myocardial perfusion imaging was performed at rest 45 minutes following the intravenous administration of Technetium 56m Sestamibi. Rest ECG: NSR - Normal EKG  Stress Procedure:  The patient exercised on the treadmill utilizing the Bruce Protocol for 5:01 minutes. The patient  stopped due to sob, fatigue and denied any chest pain.  Technetium 49m Sestamibi was injected at peak exercise and myocardial perfusion imaging was performed after a brief delay. Stress ECG: No significant change from baseline ECG  QPS Raw Data Images:  Normal; no motion artifact; normal heart/lung ratio. Stress Images:  Small, mild mid anterior perfusion defect. Rest Images:  Small, mild mid anterior perfusion defect (not as prominent as with stress).  Subtraction (SDS):  Small, mild partially reversible mid anterior perfusion defect.  Transient Ischemic Dilatation (Normal <1.22):  1.02 Lung/Heart Ratio (Normal <0.45):  0.22  Quantitative Gated Spect Images QGS EDV:  64 ml QGS ESV:  14 ml  Impression Exercise Capacity:  Poor exercise capacity. BP Response:  Normal blood pressure response. Clinical Symptoms:  Short of breath. ECG Impression:  No significant ST segment change suggestive of ischemia. Comparison with Prior Nuclear Study: No images to compare  Overall Impression:  Low risk stress nuclear study with a small, mild partially reversible mid anterior perfusion defect.  This possibly represents attenuation, but cannot completely rule out prior infarction wtih peri-infarction ischemia..  LV Ejection Fraction: 78%.  LV Wall Motion:  NL LV Function; NL Wall Motion  Marca Ancona 10/15/2012

## 2012-10-17 ENCOUNTER — Encounter: Payer: Self-pay | Admitting: Family

## 2012-10-17 ENCOUNTER — Ambulatory Visit (INDEPENDENT_AMBULATORY_CARE_PROVIDER_SITE_OTHER): Payer: BC Managed Care – PPO | Admitting: Family

## 2012-10-17 VITALS — BP 126/86 | HR 78 | Temp 98.2°F | Resp 18 | Ht 65.5 in | Wt 195.1 lb

## 2012-10-17 DIAGNOSIS — R002 Palpitations: Secondary | ICD-10-CM

## 2012-10-17 DIAGNOSIS — I10 Essential (primary) hypertension: Secondary | ICD-10-CM

## 2012-10-17 DIAGNOSIS — E66812 Obesity, class 2: Secondary | ICD-10-CM | POA: Insufficient documentation

## 2012-10-17 DIAGNOSIS — Z23 Encounter for immunization: Secondary | ICD-10-CM

## 2012-10-17 DIAGNOSIS — E669 Obesity, unspecified: Secondary | ICD-10-CM

## 2012-10-17 HISTORY — DX: Obesity, unspecified: E66.9

## 2012-10-17 MED ORDER — METOPROLOL TARTRATE 50 MG PO TABS: 50.0000 mg | ORAL_TABLET | Freq: Two times a day (BID) | ORAL | Status: DC

## 2012-10-17 NOTE — Patient Instructions (Signed)
Start tracking calories and exercise using My Fitness Pal.  Stop HCTZ, increase metoprolol to 50mg  twice daily. Follow up in 1 month.

## 2012-10-17 NOTE — Assessment & Plan Note (Signed)
Improved.  I am concerned that her mild hyponatremia could be contributing to her symptoms. Will see if she can tolerate discontinuation of hctz.  I have asked her to call me if she develops increased swelling after discontinuation.  Will increase metoprolol from 25mg  bid to 50mg  bid.   Follow up in 1 month. She would like a flu shot today.

## 2012-10-17 NOTE — Assessment & Plan Note (Signed)
We discussed importance of weight loss.  Advised pt to aim to work towards 30 minutes 5 days a week of cardio and to count calories using my fitness pal with goal weight loss of 1 pound a week.

## 2012-10-17 NOTE — Assessment & Plan Note (Addendum)
BP Readings from Last 3 Encounters:  10/17/12 126/86  10/15/12 115/73  10/03/12 147/82   Stable on current meds.   See plan below.

## 2012-10-17 NOTE — Progress Notes (Signed)
Subjective:    Patient ID: Amber [name], female    DOB: 07-17-1960, 52 y.o.   MRN: [id number]  HPI  Amber [name] is a 52 yr old female who presents today for 2 week follow up of her palpitations.  Last visit bmet revealed a mildly depressed sodium level (134), chronic stable anemia and a normal tsh.  She was referred for stress test. Stress test was completed and noted: Low risk stress nuclear study with a small, mild partially reversible mid anterior perfusion defect. This possibly represents attenuation, but cannot completely rule out prior infarction wtih peri-infarction ischemia.  Metoprolol was added to her regimen. She reports that she had brief episode of palpitations on Saturday but she took her beta blocker late and the palpitations were not as pronounced. She reports overall symptoms are much better. Denies CP/SOB or swelling.    Review of Systems See HPI  Past Medical History  Diagnosis Date  . SAB (spontaneous abortion)   . Elective abortion   . Ectopic pregnancy     RIGHT salpingostomy  . Hypertension   . GERD (gastroesophageal reflux disease)   . Anemia   . Sjogren's disease   . Glaucoma     both eyes  . Rheumatoid arthritis(714.0) 11/07/2011  . History of chicken pox     History   Social History  . Marital Status: Single    Spouse Name: N/A    Number of Children: 0  . Years of Education: N/A   Occupational History  .     Social History Main Topics  . Smoking status: Never Smoker   . Smokeless tobacco: Never Used  . Alcohol Use: No  . Drug Use: No  . Sexual Activity: No   Other Topics Concern  . Not on file   Social History Narrative   Regular exercise:  No   Caffeine Use: 2 cups coffee daily   No children   "Happily divorced"   Reports that she is involved at her church   Works with intellectually challenged adults   Born in [place]- she came to [place] as adult.  Age 22.              Past Surgical History  Procedure Laterality Date  .  Dilation and curettage of uterus      X 2  . Knee surgery      MENISCUS TEAR REPAIR  . Pelvic laparoscopy  1994    IN 1999 LAPAROSCOPY WITH INCIDENTAL ENTEROTOMY REQUIRING LAPAROTOMY  . Abdominal surgery      bowel repair    Family History  Problem Relation Age of Onset  . Hypertension Mother     died from heart disease  . Heart disease Mother     deceased, unknown cause  . Diabetes Sister   . Hypertension Sister   . Diabetes Sister     No Known Allergies  Current Outpatient Prescriptions on File Prior to Visit  Medication Sig Dispense Refill  . aspirin 81 MG EC tablet Take 81 mg by mouth daily. Swallow whole.      . cholecalciferol (VITAMIN D) 400 UNITS TABS Take 400 Units by mouth daily.      . ferrous sulfate 325 (65 FE) MG tablet Take 325 mg by mouth daily with breakfast.       . hydrochlorothiazide (HYDRODIURIL) 25 MG tablet Take 25 mg by mouth daily.      . hydrochlorothiazide (MICROZIDE) 12.5 MG capsule Take 12.5 mg by mouth daily.      Marland Kitchen  hydroxychloroquine (PLAQUENIL) 200 MG tablet Take 200 mg by mouth 2 (two) times daily.       Marland Kitchen ibuprofen (ADVIL,MOTRIN) 800 MG tablet Take 1 tablet (800 mg total) by mouth every 8 (eight) hours as needed for pain.  30 tablet  3  . lisinopril (PRINIVIL,ZESTRIL) 10 MG tablet Take 1 tablet (10 mg total) by mouth daily.  30 tablet  3  . metoprolol tartrate (LOPRESSOR) 25 MG tablet Take 1 tablet (25 mg total) by mouth 2 (two) times daily.  60 tablet  0  . TRAVATAN Z 0.004 % SOLN ophthalmic solution Place 1 drop into both eyes At bedtime.       No current facility-administered medications on file prior to visit.    BP 126/86  Pulse 78  Temp(Src) 98.2 F (36.8 C) (Oral)  Resp 18  Ht 5' 5.5" (1.664 m)  Wt 195 lb 1.3 oz (88.488 kg)  BMI 31.96 kg/m2  SpO2 99%       Objective:   Physical Exam  Constitutional: She is oriented to person, place, and time. She appears well-developed and well-nourished. No distress.  HENT:  Head:  Normocephalic and atraumatic.  Cardiovascular: Normal rate and regular rhythm.   No murmur heard. Pulmonary/Chest: Effort normal and breath sounds normal. No respiratory distress. She has no wheezes. She has no rales. She exhibits no tenderness.  Musculoskeletal: She exhibits no edema.  Lymphadenopathy:    She has no cervical adenopathy.  Neurological: She is alert and oriented to person, place, and time.  Skin: Skin is warm and dry.  Psychiatric: She has a normal mood and affect. Her behavior is normal. Judgment and thought content normal.          Assessment & Plan:

## 2012-11-11 ENCOUNTER — Other Ambulatory Visit: Payer: Self-pay | Admitting: Gynecology

## 2012-11-11 NOTE — Telephone Encounter (Signed)
Patient is due this month for CE. Last one 11/04/12.  However, I also noticed at her 10/17/12 visit with her PCP that they were evaluating her sodium level being slightly low and they asked her to try discontinuing her HCTZ.  Would appear they are the ones monitoring.

## 2012-11-21 ENCOUNTER — Ambulatory Visit: Payer: BC Managed Care – PPO | Admitting: Family

## 2012-11-25 ENCOUNTER — Encounter: Payer: Self-pay | Admitting: Family

## 2012-11-25 ENCOUNTER — Ambulatory Visit (INDEPENDENT_AMBULATORY_CARE_PROVIDER_SITE_OTHER): Payer: BC Managed Care – PPO | Admitting: Family

## 2012-11-25 VITALS — BP 138/80 | HR 85 | Temp 98.2°F | Resp 16 | Ht 65.5 in

## 2012-11-25 DIAGNOSIS — R002 Palpitations: Secondary | ICD-10-CM

## 2012-11-25 DIAGNOSIS — Z23 Encounter for immunization: Secondary | ICD-10-CM

## 2012-11-25 DIAGNOSIS — R7303 Prediabetes: Secondary | ICD-10-CM

## 2012-11-25 DIAGNOSIS — E669 Obesity, unspecified: Secondary | ICD-10-CM

## 2012-11-25 DIAGNOSIS — R7309 Other abnormal glucose: Secondary | ICD-10-CM

## 2012-11-25 DIAGNOSIS — I10 Essential (primary) hypertension: Secondary | ICD-10-CM

## 2012-11-25 LAB — BASIC METABOLIC PANEL
Chloride: 103 mEq/L (ref 96–112)
Potassium: 4.3 mEq/L (ref 3.5–5.3)
Sodium: 136 mEq/L (ref 135–145)

## 2012-11-25 LAB — HEMOGLOBIN A1C
Hgb A1c MFr Bld: 5.7 % — ABNORMAL HIGH (ref <5.7)
Mean Plasma Glucose: 117 mg/dL — ABNORMAL HIGH (ref <117)

## 2012-11-25 NOTE — Progress Notes (Signed)
Subjective:    Patient ID: Amber [name], female    DOB: [birth year], 52 y.o.   MRN: [id number]  HPI  Amber [name] is a 52 yr old female who presents today for follow up.  Palpitations- reports that the palpitations are becoming less frequent and less severe.    HTN-  Last visit hctz was discontinued due to hyponatremia. Metoprolol was increased from 25mg  to 50 mg bid.  BP Readings from Last 3 Encounters:  11/25/12 138/80  10/17/12 126/86  10/15/12 115/73    Obesity-    Weight today is unchanged from last visit.  Wt Readings from Last 3 Encounters:  10/17/12 195 lb 1.3 oz (88.488 kg)  10/15/12 193 lb (87.544 kg)  10/03/12 195 lb 8 oz (88.678 kg)     Review of Systems See HPI  Past Medical History  Diagnosis Date  . SAB (spontaneous abortion)   . Elective abortion   . Ectopic pregnancy     RIGHT salpingostomy  . Hypertension   . GERD (gastroesophageal reflux disease)   . Anemia   . Sjogren's disease   . Glaucoma     both eyes  . Rheumatoid arthritis(714.0) 11/07/2011  . History of chicken pox     History   Social History  . Marital Status: Single    Spouse Name: N/A    Number of Children: 0  . Years of Education: N/A   Occupational History  .     Social History Main Topics  . Smoking status: Never Smoker   . Smokeless tobacco: Never Used  . Alcohol Use: No  . Drug Use: No  . Sexual Activity: No   Other Topics Concern  . Not on file   Social History Narrative   Regular exercise:  No   Caffeine Use: 2 cups coffee daily   No children   "Happily divorced"   Reports that she is involved at her church   Works with intellectually challenged adults   Born in [place]- she came to [place] as adult.  Age 74.              Past Surgical History  Procedure Laterality Date  . Dilation and curettage of uterus      X 2  . Knee surgery      MENISCUS TEAR REPAIR  . Pelvic laparoscopy  1994    IN 1999 LAPAROSCOPY WITH INCIDENTAL ENTEROTOMY REQUIRING  LAPAROTOMY  . Abdominal surgery      bowel repair    Family History  Problem Relation Age of Onset  . Hypertension Mother     died from heart disease  . Heart disease Mother     deceased, unknown cause  . Diabetes Sister   . Hypertension Sister   . Diabetes Sister     No Known Allergies  Current Outpatient Prescriptions on File Prior to Visit  Medication Sig Dispense Refill  . aspirin 81 MG EC tablet Take 81 mg by mouth daily. Swallow whole.      . cholecalciferol (VITAMIN D) 400 UNITS TABS Take 400 Units by mouth daily.      . ferrous sulfate 325 (65 FE) MG tablet Take 325 mg by mouth daily with breakfast.       . hydroxychloroquine (PLAQUENIL) 200 MG tablet Take 200 mg by mouth 2 (two) times daily.       Marland Kitchen ibuprofen (ADVIL,MOTRIN) 800 MG tablet Take 1 tablet (800 mg total) by mouth every 8 (eight) hours as needed for pain.  30 tablet  3  . lisinopril (PRINIVIL,ZESTRIL) 10 MG tablet Take 1 tablet (10 mg total) by mouth daily.  30 tablet  3  . metoprolol (LOPRESSOR) 50 MG tablet Take 1 tablet (50 mg total) by mouth 2 (two) times daily.  60 tablet  2  . TRAVATAN Z 0.004 % SOLN ophthalmic solution Place 1 drop into both eyes At bedtime.       No current facility-administered medications on file prior to visit.    BP 138/80  Pulse 85  Temp(Src) 98.2 F (36.8 C) (Oral)  Resp 16  Ht 5' 5.5" (1.664 m)  SpO2 99%  LMP 11/09/2012       Objective:   Physical Exam  Constitutional: She is oriented to person, place, and time. She appears well-developed and well-nourished. No distress.  Cardiovascular: Normal rate and regular rhythm.   No murmur heard. Pulmonary/Chest: Effort normal and breath sounds normal. No respiratory distress. She has no wheezes. She has no rales. She exhibits no tenderness.  Musculoskeletal: She exhibits no edema.  Neurological: She is alert and oriented to person, place, and time.  Skin: Skin is warm and dry.  Psychiatric: She has a normal mood and  affect. Her behavior is normal. Judgment and thought content normal.          Assessment & Plan:

## 2012-11-25 NOTE — Addendum Note (Signed)
Addended by: Mervin Kung A on: 11/25/2012 09:16 AM   Modules accepted: Orders

## 2012-11-25 NOTE — Assessment & Plan Note (Signed)
Stable on current dose of metoprolol, check bmet to reassess sodium.

## 2012-11-25 NOTE — Assessment & Plan Note (Signed)
Due for follow up A1C.   

## 2012-11-25 NOTE — Assessment & Plan Note (Signed)
Discussed continuing weight loss efforts.

## 2012-11-25 NOTE — Progress Notes (Signed)
Pre visit review using our clinic review tool, if applicable. No additional management support is needed unless otherwise documented below in the visit note. 

## 2012-11-25 NOTE — Assessment & Plan Note (Signed)
Improved,continue current dose of metoprolol.

## 2012-11-25 NOTE — Patient Instructions (Signed)
Please complete lab work prior to leaving. Follow up in 3 months.  

## 2012-12-12 ENCOUNTER — Telehealth: Payer: Self-pay | Admitting: *Deleted

## 2012-12-12 NOTE — Telephone Encounter (Signed)
Lm informed the pt that their provider is on call 01/02/13. gv appt for 01/14/13 w/labs@ 3:30pm and ov@ 4pm. Pt is aware that i will mail a letter/avs...td

## 2012-12-24 ENCOUNTER — Ambulatory Visit (INDEPENDENT_AMBULATORY_CARE_PROVIDER_SITE_OTHER): Payer: BC Managed Care – PPO | Admitting: Gynecology

## 2012-12-24 ENCOUNTER — Encounter: Payer: Self-pay | Admitting: Gynecology

## 2012-12-24 VITALS — BP 134/86 | Ht 60.75 in | Wt 199.4 lb

## 2012-12-24 DIAGNOSIS — N83209 Unspecified ovarian cyst, unspecified side: Secondary | ICD-10-CM

## 2012-12-24 DIAGNOSIS — Z01419 Encounter for gynecological examination (general) (routine) without abnormal findings: Secondary | ICD-10-CM

## 2012-12-24 DIAGNOSIS — D259 Leiomyoma of uterus, unspecified: Secondary | ICD-10-CM

## 2012-12-24 DIAGNOSIS — N83202 Unspecified ovarian cyst, left side: Secondary | ICD-10-CM | POA: Insufficient documentation

## 2012-12-24 DIAGNOSIS — N951 Menopausal and female climacteric states: Secondary | ICD-10-CM

## 2012-12-24 DIAGNOSIS — N920 Excessive and frequent menstruation with regular cycle: Secondary | ICD-10-CM

## 2012-12-24 DIAGNOSIS — D649 Anemia, unspecified: Secondary | ICD-10-CM

## 2012-12-24 NOTE — Patient Instructions (Signed)
Hysterectomy Information  A hysterectomy is a procedure where your uterus is surgically removed. It will no longer be possible to have menstrual periods or to become pregnant. The tubes and ovaries can be removed (bilateral salpingo-oopherectomy) during this surgery as well.  REASONS FOR A HYSTERECTOMY  Persistent, abnormal bleeding.  Lasting (chronic) pelvic pain or infection.  The lining of the uterus (endometrium) starts growing outside the uterus (endometriosis).  The endometrium starts growing in the muscle of the uterus (adenomyosis).  The uterus falls down into the vagina (pelvic organ prolapse).  Symptomatic uterine fibroids.  Precancerous cells.  Cervical cancer or uterine cancer. TYPES OF HYSTERECTOMIES  Supracervical hysterectomy. This type removes the top part of the uterus, but not the cervix.  Total hysterectomy. This type removes the uterus and cervix.  Radical hysterectomy. This type removes the uterus, cervix, and the fibrous tissue that holds the uterus in place in the pelvis (parametrium). WAYS A HYSTERECTOMY CAN BE PERFORMED  Abdominal hysterectomy. A large surgical cut (incision) is made in the abdomen. The uterus is removed through this incision.  Vaginal hysterectomy. An incision is made in the vagina. The uterus is removed through this incision. There are no abdominal incisions.  Conventional laparoscopic hysterectomy. A thin, lighted tube with a camera (laparoscope) is inserted into 3 or 4 small incisions in the abdomen. The uterus is cut into small pieces. The small pieces are removed through the incisions, or they are removed through the vagina.  Laparoscopic assisted vaginal hysterectomy (LAVH). Three or four small incisions are made in the abdomen. Part of the surgery is performed laparoscopically and part vaginally. The uterus is removed through the vagina.  Robot-assisted laparoscopic hysterectomy. A laparoscope is inserted into 3 or 4 small  incisions in the abdomen. A computer-controlled device is used to give the surgeon a 3D image. This allows for more precise movements of surgical instruments. The uterus is cut into small pieces and removed through the incisions or removed through the vagina. RISKS OF HYSTERECTOMY   Bleeding and risk of blood transfusion. Tell your caregiver if you do not want to receive any blood products.  Blood clots in the legs or lung.  Infection.  Injury to surrounding organs.  Anesthesia problems or side effects.  Conversion to an abdominal hysterectomy. WHAT TO EXPECT AFTER A HYSTERECTOMY  You will be given pain medicine.  You will need to have someone with you for the first 3 to 5 days after you go home.  You will need to follow up with your surgeon in 2 to 4 weeks after surgery to evaluate your progress.  You may have early menopause symptoms like hot flashes, night sweats, and insomnia.  If you had a hysterectomy for a problem that was not a cancer or a condition that could lead to cancer, then you no longer need Pap tests. However, even if you no longer need a Pap test, a regular exam is a good idea to make sure no other problems are starting. Document Released: 06/13/2000 Document Revised: 03/12/2011 Document Reviewed: 07/29/2010 [facility] Patient Information 2014 [place], [place]. Uterine Fibroid A uterine fibroid is a growth (tumor) that occurs in a woman's uterus. This type of tumor is not cancerous and does not spread out of the uterus. A woman can have one or many fibroids, and the fiboid(s) can become quite large. A fibroid can vary in size, weight, and where it grows in the uterus. Most fibroids do not require medical treatment, but some can cause pain  or heavy bleeding during and between periods. CAUSES  A fibroid is the result of a single uterine cell that keeps growing (unregulated), which is different than most cells in the human body. Most cells have a control mechanism that keeps  them from reproducing without control.  SYMPTOMS   Bleeding.  Pelvic pain and pressure.  Bladder problems due to the size of the fibroid.  Infertility and miscarriages depending on the size and location of the fibroid. DIAGNOSIS  A diagnosis is made by physical exam. Your caregiver may feel the lumpy tumors during a pelvic exam. Important information regarding size, location, and number of tumors can be gained by having an ultrasound. It is rare that other tests, such as a CT scan or MRI, are needed. TREATMENT   Your caregiver may recommend watchful waiting. This involves getting the fibroid checked by your caregiver to see if the fibroids grow or shrink.   Hormonal treatment or an intrauterine device (IUD) may be prescribed.   Surgery may be needed to remove the fibroids (myomectomy) or the uterus (hysterectomy). This depends on your situation. When fibroids interfere with fertility and a woman wants to become pregnant, a caregiver may recommend having the fibroids removed.  HOME CARE INSTRUCTIONS  Home care depends on how you were treated. In general:   Keep all follow-up appointments with your caregiver.   Only take medicine as told by your caregiver. Do not take aspirin. It can cause bleeding.   If you have excessive periods and soak tampons or pads in a half hour or less, contact your caregiver immediately. If your periods are troublesome but not so heavy, lie down with your feet raised slightly above your heart. Place cold packs on your lower abdomen.   If your periods are heavy, write down the number of pads or tampons you use per month. Bring this information to your caregiver.   Talk to your caregiver about taking iron pills.   Include green vegetables in your diet.   If you were prescribed a hormonal treatment, take the hormonal medicines as directed.   If you need surgery, ask your caregiver for information on your specific surgery.  SEEK IMMEDIATE MEDICAL  CARE IF:  You have pelvic pain or cramps not controlled with medicines.   You have a sudden increase in pelvic pain.   You have an increase of bleeding between and during periods.   You feel lightheaded or have fainting episodes.  MAKE SURE YOU:  Understand these instructions.  Will watch your condition.  Will get help right away if you are not doing well or get worse. Document Released: 12/16/1999 Document Revised: 03/12/2011 Document Reviewed: 07/17/2012 [facility] Patient Information 2014 [place], [place].

## 2012-12-24 NOTE — Progress Notes (Addendum)
Amber [name] 1960-05-18 [id number]   History:    52 y.o.  for annual gyn exam and followup. 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 Review of her records indicated that since 2011 she had been evaluated by Dr. Mancel Bale (hematologist oncologist as well as because of her neutropenia. She is currently taking iron supplementation 3 tablets daily. She has had a history of an ultrasound the past and sonohysterogram which had demonstrated she had several fibroids the largest one measuring 4 cm in size. Review of correct are also indicated the following:  1 spontaneous AB  1 elective AB  1 right ectopic resulted in salpingostomy (1994)  Laparoscopic attempt of removing ovarian cyst by another physician in [place] resulted in an incidental enterotomy regarding laparotomy  Patient on 11/21/2011 had an ultrasound a Uterus measured 10.8 x 7.3 x 6.7 cm endometrial stripe 6.0 mm. Several intramural and subserosal fibroids the largest one measuring 25 x 24 mm. The IUD was seen in the lower uterine segment the right ovary had a thin-walled cyst measuring 30 x 25 x 22 mm which appeared to be avascular but some bright echogenicity like floating debris was noted possibly hemorrhagic cyst. Left ovary appeared to be collapsed with a measurement of 22 x 12 x 20 mm thick wall negative color flow possible corpus luteum cyst. There was no fluid in the cul-de-sac. She had received a shot of Depo-Provera 150 mg IM and had a followup ultrasound 03/24/2012 with the following results:  Uterus measured 13.3 x 8.9 x 7.7 mm with endometrial stripe of 8.4 mm. The IUD was seen in the lower uterine segment. Right ovarian cyst resolved. Previous has not seen. Left ovary on transvaginal and transabdominal images there was in large ovary with thick wall cystic mass measuring 5.7 x 4.3 x 5.1 cm average size 5.0 cm with reticular echo pattern. Avascular. Arterial  blood flow was seen in the ovary only.  Her IUD was removed. Patient's last CBC 11/05/2011 with a hemoglobin 10.8 hematocrit 33.5 with a normal platelet count 331,000.  been She was going to be placed on Lupron but insurance would not cover it. So she has been off of any medication. She states she's having cycles once a month which last for 5 days but they're very heavy.   She had a colonoscopy in 2013 and 2 benign polyps were removed.  Past medical history,surgical history, family history and social history were all reviewed and documented in the EPIC chart.  Gynecologic History Patient's last menstrual period was 12/24/2012. Contraception: none Last Pap: 2012. Results were: normal Last mammogram: January 2014. Results were: normal  Obstetric History OB History  Gravida Para Term Preterm AB SAB TAB Ectopic Multiple Living  3 0   2   1  0    # Outcome Date GA Lbr Len/2nd Weight Sex Delivery Anes PTL Lv  3 ECT           2 ABT           1 GRA                ROS: A ROS was performed and pertinent positives and negatives are included in the history.  GENERAL: No fevers or chills. HEENT: No change in vision, no earache, sore throat or sinus congestion. NECK: No pain or stiffness. CARDIOVASCULAR: No chest pain or pressure. No palpitations. PULMONARY: No shortness of breath, cough or wheeze. GASTROINTESTINAL: No abdominal pain,  nausea, vomiting or diarrhea, melena or bright red blood per rectum. GENITOURINARY: No urinary frequency, urgency, hesitancy or dysuria. MUSCULOSKELETAL: No joint or muscle pain, no back pain, no recent trauma. DERMATOLOGIC: No rash, no itching, no lesions. ENDOCRINE: No polyuria, polydipsia, no heat or cold intolerance. No recent change in weight. HEMATOLOGICAL: No anemia or easy bruising or bleeding. NEUROLOGIC: No headache, seizures, numbness, tingling or weakness. PSYCHIATRIC: No depression, no loss of interest in normal activity or change in sleep pattern.      Exam: chaperone present  BP 134/86  Ht 5' 0.75" (1.543 m)  Wt 199 lb 6.4 oz (90.447 kg)  BMI 37.99 kg/m2  LMP 12/24/2012  Body mass index is 37.99 kg/(m^2).  General appearance : Well developed well nourished female. No acute distress HEENT: Neck supple, trachea midline, no carotid bruits, no thyroidmegaly Lungs: Clear to auscultation, no rhonchi or wheezes, or rib retractions  Heart: Regular rate and rhythm, no murmurs or gallops Breast:Examined in sitting and supine position were symmetrical in appearance, no palpable masses or tenderness,  no skin retraction, no nipple inversion, no nipple discharge, no skin discoloration, no axillary or supraclavicular lymphadenopathy Abdomen: no palpable masses or tenderness, no rebound or guarding Extremities: no edema or skin discoloration or tenderness  Pelvic:  Bartholin, Urethra, Skene Glands: Within normal limits             Vagina: No gross lesions or discharge  Cervix: No gross lesions or discharge  Uterus  14-16 week size,irregular and nontender  Adnexa  Without masses or tenderness  Anus and perineum  normal   Rectovaginal  normal sphincter tone without palpated masses or tenderness             Hemoccult course will be provided by her PCP     Assessment/Plan:  52 y.o. female for annual exam with an enlarging leiomyomatous uteri contributing to her menorrhagia and her iron  Deficiency anemia. She is interested in proceeding in 2015 with abdominal hysterectomy and ovarian conservation. Literature information will be provided. She'll return to the office the next several weeks for followup ultrasound to better assess her adnexa as well. Pap smear was not done today according  to new guidelines. PCP did her blood work. We discussed importance of calcium and vitamin D for osteoporosis prevention.  Note: This dictation was prepared with  Dragon/digital dictation along withSmart phrase technology. Any transcriptional errors that result  from this process are unintentional.   Ok Edwards MD, 10:41 AM 12/24/2012

## 2013-01-02 ENCOUNTER — Other Ambulatory Visit: Payer: BC Managed Care – PPO

## 2013-01-02 ENCOUNTER — Ambulatory Visit: Payer: BC Managed Care – PPO | Admitting: Oncology

## 2013-01-08 ENCOUNTER — Other Ambulatory Visit: Payer: Self-pay | Admitting: Gynecology

## 2013-01-08 ENCOUNTER — Ambulatory Visit (INDEPENDENT_AMBULATORY_CARE_PROVIDER_SITE_OTHER): Payer: BC Managed Care – PPO

## 2013-01-08 ENCOUNTER — Encounter: Payer: Self-pay | Admitting: Gynecology

## 2013-01-08 ENCOUNTER — Ambulatory Visit (INDEPENDENT_AMBULATORY_CARE_PROVIDER_SITE_OTHER): Payer: BC Managed Care – PPO | Admitting: Gynecology

## 2013-01-08 VITALS — BP 130/90

## 2013-01-08 DIAGNOSIS — D259 Leiomyoma of uterus, unspecified: Secondary | ICD-10-CM

## 2013-01-08 DIAGNOSIS — N83202 Unspecified ovarian cyst, left side: Secondary | ICD-10-CM

## 2013-01-08 DIAGNOSIS — N920 Excessive and frequent menstruation with regular cycle: Secondary | ICD-10-CM

## 2013-01-08 DIAGNOSIS — N852 Hypertrophy of uterus: Secondary | ICD-10-CM

## 2013-01-08 DIAGNOSIS — D252 Subserosal leiomyoma of uterus: Secondary | ICD-10-CM

## 2013-01-08 DIAGNOSIS — Z8742 Personal history of other diseases of the female genital tract: Secondary | ICD-10-CM

## 2013-01-08 DIAGNOSIS — D251 Intramural leiomyoma of uterus: Secondary | ICD-10-CM

## 2013-01-08 DIAGNOSIS — D649 Anemia, unspecified: Secondary | ICD-10-CM

## 2013-01-08 DIAGNOSIS — N83209 Unspecified ovarian cyst, unspecified side: Secondary | ICD-10-CM

## 2013-01-08 MED ORDER — TRANEXAMIC ACID 650 MG PO TABS: 1300.0000 mg | ORAL_TABLET | Freq: Three times a day (TID) | ORAL | Status: DC

## 2013-01-08 NOTE — Patient Instructions (Signed)
Tranexamic acid oral tablets What is this medicine? TRANEXAMIC ACID (TRAN ex AM ik AS id) slows down or stops blood clots from being broken down. This medicine is used to treat heavy monthly menstrual bleeding. This medicine may be used for other purposes; ask your health care provider or pharmacist if you have questions. COMMON BRAND NAME(S): Cyklokapron, Lysteda  What should I tell my health care provider before I take this medicine? They need to know if you have any of these conditions: -bleeding in the brain -blood clotting problems -kidney disease -vision problems -an unusual allergic reaction to tranexamic acid, other medicines, foods, dyes, or preservatives -pregnant or trying to get pregnant -breast-feeding How should I use this medicine? Take this medicine by mouth with a glass of water. Follow the directions on the prescription label. Do not cut, crush, or chew this medicine. You can take it with or without food. If it upsets your stomach, take it with food. Take your medicine at regular intervals. Do not take it more often than directed. Do not stop taking except on your doctor's advice. Do not take this medicine until your period has started. Do not take it for more than 5 days in a row. Do not take this medicine when you do not have your period. Talk to your pediatrician regarding the use of this medicine in children. While this drug may be prescribed for female children as young as 68 years of age for selected conditions, precautions do apply. Overdosage: If you think you've taken too much of this medicine contact a poison control center or emergency room at once. Overdosage: If you think you have taken too much of this medicine contact a poison control center or emergency room at once. NOTE: This medicine is only for you. Do not share this medicine with others. What if I miss a dose? If you miss a dose, take it when you remember, and then take your next dose at least 6 hours  later. Do not take more than 2 tablets at a time to make up for missed doses. What may interact with this medicine? Do not take this medicine with any of the following medications: -female hormones, like estrogens or progestins and birth control pills, patches, rings, or injections  This medicine may also interact with the following medications: -certain medicines used to help your blood clot or break up blood clots -certain medicines used to treat leukemia This list may not describe all possible interactions. Give your health care provider a list of all the medicines, herbs, non-prescription drugs, or dietary supplements you use. Also tell them if you smoke, drink alcohol, or use illegal drugs. Some items may interact with your medicine. What should I watch for while using this medicine? Tell your doctor or healthcare professional if your symptoms do not start to get better or if they get worse. Tell your doctor or healthcare professional if you notice any eye problems while taking this medicine. Your doctor will refer you to an eye doctor who will examine your eyes. What side effects may I notice from receiving this medicine? Side effects that you should report to your doctor or health care professional as soon as possible: -allergic reactions like skin rash, itching or hives, swelling of the face, lips, or tongue -breathing difficulties -changes in vision -sudden or severe pain in the chest, legs, head, or groin -unusually weak or tired  Side effects that usually do not require medical attention (Report these to your doctor or health  care professional if they continue or are bothersome.): -back pain -headache -muscle or joint aches -sinus and nasal problems -stomach pain -tiredness This list may not describe all possible side effects. Call your doctor for medical advice about side effects. You may report side effects to FDA at 1-800-FDA-1088. Where should I keep my medicine? Keep out of  the reach of children. Store at room temperature between 15 and 30 degrees C (59 and 86 degrees F). Throw away any unused medicine after the expiration date. NOTE: This sheet is a summary. It may not cover all possible information. If you have questions about this medicine, talk to your doctor, pharmacist, or health care provider.  2014, Elsevier/[employer] Standard. (2011-12-03 17:45:19)

## 2013-01-08 NOTE — Progress Notes (Signed)
   53 year old patient presented to the office today to discuss results of her ultrasound. 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 Review of her records indicated that since 2011 she had been evaluated by Dr. Julieanne Manson (hematologist oncologist as well as because of her neutropenia. She is currently taking iron supplementation 3 tablets daily. She has had a history of an ultrasound the past and sonohysterogram which had demonstrated she had several fibroids the largest one measuring 4 cm in size. Review of correct are also indicated the following:   spontaneous AB  1 elective AB  1 right ectopic resulted in salpingostomy (1994)  Laparoscopic attempt of removing ovarian cyst by another physician in [place] resulted in an incidental enterotomy regarding laparotomy  Patient on 11/21/2011 had an ultrasound a Uterus measured 10.8 x 7.3 x 6.7 cm endometrial stripe 6.0 mm. Several intramural and subserosal fibroids the largest one measuring 25 x 24 mm. The IUD was seen in the lower uterine segment the right ovary had a thin-walled cyst measuring 30 x 25 x 22 mm which appeared to be avascular but some bright echogenicity like floating debris was noted possibly hemorrhagic cyst. Left ovary appeared to be collapsed with a measurement of 22 x 12 x 20 mm thick wall negative color flow possible corpus luteum cyst. There was no fluid in the cul-de-sac. She had received a shot of Depo-Provera 150 mg IM and had a followup ultrasound 03/24/2012 with the following results:  Uterus measured 13.3 x 8.9 x 7.7 mm with endometrial stripe of 8.4 mm. The IUD was seen in the lower uterine segment. Right ovarian cyst resolved. Previous has not seen. Left ovary on transvaginal and transabdominal images there was in large ovary with thick wall cystic mass measuring 5.7 x 4.3 x 5.1 cm average size 5.0 cm with reticular echo pattern. Avascular. Arterial blood flow was  seen in the ovary only.  Her IUD will was removed in November 2013. She states now she's having regular cycles last from 4-7 days but no intermenstrual bleeding reported.  Ultrasound today: Uterus measured 4.8 x 9.0 x 6.9 cm with endometrial stripe is 7.5 mm. Which has increased since last ultrasound scan as well as the sizes of these fibroids. Previously seen cystic mass has completely resolved. Both right and left ovary were normal.  Assessment/plan: Patient with long-standing history of leiomyomatous uteri contributing to her iron deficiency anemia. Patient currently on iron 3 times a day and has been followed by the hematologist as well. Patient lives by herself and would have to have family member come from [place] to help take care of her to recover for major surgery. We have talked last year and for years prior to proceed with an abdominal hysterectomy with ovarian conservation. Patient states she would like to wait now that her ovarian cyst is resolved and that her cycles are regular although heavy. I explained her the concerns that when she does into the menopause and these fibroids it continued to grow there is always a small possibility of leiomyosarcoma. She fully understands and accepts. We're going to start her on Lysteda 650 mg 2 tablets 3 times a day for 5 days to cut down on her bleeding and cramping. Will follow up with an ultrasound in one year. Pap smear 2012 was normal. Patient given requisitions to schedule the mammogram.

## 2013-01-14 ENCOUNTER — Ambulatory Visit (HOSPITAL_BASED_OUTPATIENT_CLINIC_OR_DEPARTMENT_OTHER): Payer: BC Managed Care – PPO | Admitting: Oncology

## 2013-01-14 ENCOUNTER — Other Ambulatory Visit (HOSPITAL_BASED_OUTPATIENT_CLINIC_OR_DEPARTMENT_OTHER): Payer: BC Managed Care – PPO

## 2013-01-14 ENCOUNTER — Telehealth: Payer: Self-pay | Admitting: Oncology

## 2013-01-14 ENCOUNTER — Encounter: Payer: Self-pay | Admitting: Oncology

## 2013-01-14 ENCOUNTER — Encounter (INDEPENDENT_AMBULATORY_CARE_PROVIDER_SITE_OTHER): Payer: Self-pay

## 2013-01-14 VITALS — BP 149/91 | HR 89 | Temp 99.3°F | Resp 18 | Ht 60.0 in | Wt 202.0 lb

## 2013-01-14 DIAGNOSIS — D649 Anemia, unspecified: Secondary | ICD-10-CM

## 2013-01-14 DIAGNOSIS — D509 Iron deficiency anemia, unspecified: Secondary | ICD-10-CM

## 2013-01-14 DIAGNOSIS — D72819 Decreased white blood cell count, unspecified: Secondary | ICD-10-CM

## 2013-01-14 DIAGNOSIS — N920 Excessive and frequent menstruation with regular cycle: Secondary | ICD-10-CM

## 2013-01-14 LAB — CBC WITH DIFFERENTIAL/PLATELET
BASO%: 0.6 % (ref 0.0–2.0)
Basophils Absolute: 0 10*3/uL (ref 0.0–0.1)
EOS ABS: 0.1 10*3/uL (ref 0.0–0.5)
EOS%: 3.3 % (ref 0.0–7.0)
HCT: 37.5 % (ref 34.8–46.6)
HGB: 12.2 g/dL (ref 11.6–15.9)
LYMPH%: 53.2 % — AB (ref 14.0–49.7)
MCH: 28.1 pg (ref 25.1–34.0)
MCHC: 32.6 g/dL (ref 31.5–36.0)
MCV: 86.3 fL (ref 79.5–101.0)
MONO#: 0.4 10*3/uL (ref 0.1–0.9)
MONO%: 11 % (ref 0.0–14.0)
NEUT%: 31.9 % — ABNORMAL LOW (ref 38.4–76.8)
NEUTROS ABS: 1 10*3/uL — AB (ref 1.5–6.5)
PLATELETS: 223 10*3/uL (ref 145–400)
RBC: 4.34 10*6/uL (ref 3.70–5.45)
RDW: 14.8 % — AB (ref 11.2–14.5)
WBC: 3.2 10*3/uL — AB (ref 3.9–10.3)
lymph#: 1.7 10*3/uL (ref 0.9–3.3)

## 2013-01-14 LAB — IRON AND TIBC CHCC
%SAT: 23 % (ref 21–57)
Iron: 63 ug/dL (ref 41–142)
TIBC: 271 ug/dL (ref 236–444)
UIBC: 208 ug/dL (ref 120–384)

## 2013-01-14 LAB — FERRITIN CHCC: FERRITIN: 59 ng/mL (ref 9–269)

## 2013-01-14 NOTE — Progress Notes (Signed)
Hematology and Oncology Follow Up Visit  Amber [name] [id number] 06/22/1960 53 y.o. 01/14/2013 3:35 PM [name],Amber S., NPO'Sullivan, [name], NP   Principle Diagnosis: 53 year old woman with iron deficiency anemia as well as fluctuating reactive leukocytopenia was diagnosed in April of 2014.   Prior Therapy: She is status post IV iron infusion in the form of Feraheme given on 05/05/2012 and repeated in 10/2012.   Current therapy: Oral iron supplements on a daily basis.  Interim History: Amber [name] presents today for a followup visit. She presents today for a followup visit. She is status post IV iron as mentioned and have tolerated it very well without any complications. She is reporting some improvement in her symptoms but she still has heavy menstrual bleeding at this time. Most recently she also developed low-grade fever and congestion but no hospitalization or pneumonia. She reports that she is taking her oral iron daily. She has not reported any chest pain or shortness of breath. Has not reported any other bleeding such as GI bleeding or GU bleeding.  Medications: I have reviewed the patient's current medications.  Current Outpatient Prescriptions  Medication Sig Dispense Refill  . cholecalciferol (VITAMIN D) 400 UNITS TABS Take 400 Units by mouth daily.      . ferrous sulfate 325 (65 FE) MG tablet Take 325 mg by mouth daily with breakfast.       . hydroxychloroquine (PLAQUENIL) 200 MG tablet Take 200 mg by mouth 2 (two) times daily.       Marland Kitchen ibuprofen (ADVIL,MOTRIN) 800 MG tablet Take 1 tablet (800 mg total) by mouth every 8 (eight) hours as needed for pain.  30 tablet  3  . lisinopril (PRINIVIL,ZESTRIL) 10 MG tablet Take 1 tablet (10 mg total) by mouth daily.  30 tablet  3  . metoprolol (LOPRESSOR) 50 MG tablet Take 1 tablet (50 mg total) by mouth 2 (two) times daily.  60 tablet  2  . tranexamic acid (LYSTEDA) 650 MG TABS tablet Take 2 tablets (1,300 mg total) by mouth 3  (three) times daily.  30 tablet  11  . TRAVATAN Z 0.004 % SOLN ophthalmic solution Place 1 drop into both eyes At bedtime.       No current facility-administered medications for this visit.     Allergies: No Known Allergies  Past Medical History, Surgical history, Social history, and Family History were reviewed and updated.  Review of Systems: Constitutional:  Negative for fever, chills, night sweats, anorexia, weight loss, pain.  Remaining ROS negative.  Physical Exam: Blood pressure 149/91, pulse 89, temperature 99.3 F (37.4 C), temperature source Oral, resp. rate 18, height 5' (1.524 m), weight 202 lb (91.627 kg), last menstrual period 12/24/2012. ECOG: 0 General appearance: alert Head: Normocephalic, without obvious abnormality, atraumatic Neck: no adenopathy, no carotid bruit, no JVD, supple, symmetrical, trachea midline and thyroid not enlarged, symmetric, no tenderness/mass/nodules Lymph nodes: Cervical, supraclavicular, and axillary nodes normal. Heart:regular rate and rhythm, S1, S2 normal, no murmur, click, rub or gallop Lung:chest clear, no wheezing, rales, normal symmetric air entry Abdomen: soft, non-tender, without masses or organomegaly EXT:no erythema, induration, or nodules   Lab Results: Lab Results  Component Value Date   WBC 3.2* 01/14/2013   HGB 12.2 01/14/2013   HCT 37.5 01/14/2013   MCV 86.3 01/14/2013   PLT 223 01/14/2013     Chemistry      Component Value Date/Time   NA 136 11/25/2012 0846   NA 139 09/22/2012 0933   K 4.3 11/25/2012  0846   K 3.8 09/22/2012 0933   CL 103 11/25/2012 0846   CL 108* 06/25/2012 1508   CO2 24 11/25/2012 0846   CO2 25 09/22/2012 0933   BUN 9 11/25/2012 0846   BUN 9.0 09/22/2012 0933   CREATININE 0.73 11/25/2012 0846   CREATININE 0.8 09/22/2012 0933      Component Value Date/Time   CALCIUM 9.5 11/25/2012 0846   CALCIUM 9.1 09/22/2012 0933   ALKPHOS 45 09/22/2012 0933   ALKPHOS 46 11/05/2011 1309   AST 17 09/22/2012 0933    AST 14 11/05/2011 1309   ALT 16 09/22/2012 0933   ALT 15 11/05/2011 1309   BILITOT 0.35 09/22/2012 0933   BILITOT 0.4 11/05/2011 1309       Impression and Plan:  53 year old woman with the following issues:  1. 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 2. Leukocytopenia: Her total white cell count is actually improved with an absolute neutrophil count of 1000. Again this is most likely reactive in nature but we will continue to monitor closely to make sure this is not a sign of a lymphoproliferative disorder.  3. Followup will be in 4 months time to reassess her red cell and white cell count.  NWGNFA,OZHYQ 1/14/20153:35 PM

## 2013-01-14 NOTE — Telephone Encounter (Signed)
gv and printed appt sched and avs for pt for May °

## 2013-02-09 ENCOUNTER — Encounter: Payer: Self-pay | Admitting: Gynecology

## 2013-02-19 ENCOUNTER — Encounter: Payer: Self-pay | Admitting: Family

## 2013-03-02 ENCOUNTER — Ambulatory Visit: Payer: BC Managed Care – PPO | Admitting: Family

## 2013-03-04 ENCOUNTER — Encounter: Payer: Self-pay | Admitting: Family

## 2013-03-04 ENCOUNTER — Telehealth: Payer: Self-pay | Admitting: Family

## 2013-03-04 ENCOUNTER — Ambulatory Visit (INDEPENDENT_AMBULATORY_CARE_PROVIDER_SITE_OTHER): Payer: BC Managed Care – PPO | Admitting: Family

## 2013-03-04 VITALS — BP 136/90 | HR 64 | Temp 98.4°F | Resp 16 | Ht 65.5 in | Wt 200.0 lb

## 2013-03-04 DIAGNOSIS — R7309 Other abnormal glucose: Secondary | ICD-10-CM

## 2013-03-04 DIAGNOSIS — R739 Hyperglycemia, unspecified: Secondary | ICD-10-CM

## 2013-03-04 DIAGNOSIS — Z0000 Encounter for general adult medical examination without abnormal findings: Secondary | ICD-10-CM

## 2013-03-04 DIAGNOSIS — D649 Anemia, unspecified: Secondary | ICD-10-CM

## 2013-03-04 DIAGNOSIS — R519 Headache, unspecified: Secondary | ICD-10-CM | POA: Insufficient documentation

## 2013-03-04 DIAGNOSIS — R7303 Prediabetes: Secondary | ICD-10-CM

## 2013-03-04 DIAGNOSIS — R51 Headache: Secondary | ICD-10-CM | POA: Insufficient documentation

## 2013-03-04 DIAGNOSIS — I10 Essential (primary) hypertension: Secondary | ICD-10-CM

## 2013-03-04 LAB — HEMOGLOBIN A1C
Hgb A1c MFr Bld: 5.9 % — ABNORMAL HIGH (ref <5.7)
Mean Plasma Glucose: 123 mg/dL — ABNORMAL HIGH (ref <117)

## 2013-03-04 LAB — LIPID PANEL
Cholesterol: 127 mg/dL (ref 0–200)
HDL: 44 mg/dL (ref >39)
LDL CALC: 72 mg/dL (ref 0–99)
Total CHOL/HDL Ratio: 2.9 Ratio
Triglycerides: 53 mg/dL (ref <150)
VLDL: 11 mg/dL (ref 0–40)

## 2013-03-04 MED ORDER — LISINOPRIL 10 MG PO TABS
10.0000 mg | ORAL_TABLET | Freq: Every day | ORAL | Status: DC
Start: 2013-03-04 — End: 2013-09-04

## 2013-03-04 MED ORDER — METOPROLOL TARTRATE 50 MG PO TABS
50.0000 mg | ORAL_TABLET | Freq: Two times a day (BID) | ORAL | Status: DC
Start: 2013-03-04 — End: 2014-04-15

## 2013-03-04 NOTE — Assessment & Plan Note (Signed)
Obtain A1C. 

## 2013-03-04 NOTE — Telephone Encounter (Signed)
Relevant patient education assigned to patient using Emmi. ° °

## 2013-03-04 NOTE — Patient Instructions (Signed)
Please complete your lab work prior to leaving. You may use tylenol or motrin (sparingly on motrin) as needed for headaches. Call if headaches worsen, or if not improved in the next few weeks. Follow up in 3 months.

## 2013-03-04 NOTE — Assessment & Plan Note (Signed)
Sounds like tension HA's. Recommended that she use tylenol or motrin (sparing use of motrin) as needed. If symptoms do not improve in the next few weeks, consider referral to headache clinic.

## 2013-03-04 NOTE — Progress Notes (Signed)
Pre visit review using our clinic review tool, if applicable. No additional management support is needed unless otherwise documented below in the visit note. 

## 2013-03-04 NOTE — Assessment & Plan Note (Signed)
BP stable on current meds. Continue same.  

## 2013-03-04 NOTE — Progress Notes (Signed)
Subjective:    Patient ID: Jovita Gamma, female    DOB: Mar 17, 1960, 53 y.o.   MRN: [id number]  HPI  Ms. Colan is a 53 yr old female who presents today for follow up.  1)HTN-  She is currently maintained on metoprolol.  She was noted to have mild hyponatremia when she was on hctz, but this was discontinued and her follow up sodium level was normal. Reports daily headaches.  Denies CP/SOB or LE edema. She reports HA's are 7/10- resolve on own.  Denies associated nausea with headaches. Denies blurred vision.  BP Readings from Last 3 Encounters:  03/04/13 136/90  01/14/13 149/91  01/08/13 130/90   2) Borderline DM- Last A1C was 5.7.    3) Anemia- she is followed by Dr. Alen Blew (heme). She is maintained on oral iron. Her anemia is felt to be secondary to menorrhagia for which she sees Dr. Toney Rakes (GYN).  He started her on Lysteda but she discontinued due to nausea.   Review of Systems  Past Medical History  Diagnosis Date  . SAB (spontaneous abortion)   . Elective abortion   . Ectopic pregnancy     RIGHT salpingostomy  . Hypertension   . GERD (gastroesophageal reflux disease)   . Anemia   . Sjogren's disease   . Glaucoma     both eyes  . Rheumatoid arthritis(714.0) 11/07/2011  . History of chicken pox     History   Social History  . Marital Status: Single    Spouse Name: N/A    Number of Children: 0  . Years of Education: N/A   Occupational History  .     Social History Main Topics  . Smoking status: Never Smoker   . Smokeless tobacco: Never Used  . Alcohol Use: No  . Drug Use: No  . Sexual Activity: No   Other Topics Concern  . Not on file   Social History Narrative   Regular exercise:  No   Caffeine Use: 2 cups coffee daily   No children   "Happily divorced"   Reports that she is involved at her church   Works with intellectually challenged adults   Born in [place]- she came to [place] as adult.  Age 81.              Past Surgical History    Procedure Laterality Date  . Dilation and curettage of uterus      X 2  . Knee surgery      MENISCUS TEAR REPAIR  . Pelvic laparoscopy  1994    IN 1999 LAPAROSCOPY WITH INCIDENTAL ENTEROTOMY REQUIRING LAPAROTOMY  . Abdominal surgery      bowel repair    Family History  Problem Relation Age of Onset  . Hypertension Mother     died from heart disease  . Heart disease Mother     deceased, unknown cause  . Diabetes Sister   . Hypertension Sister   . Diabetes Sister     No Known Allergies  Current Outpatient Prescriptions on File Prior to Visit  Medication Sig Dispense Refill  . cholecalciferol (VITAMIN D) 400 UNITS TABS Take 400 Units by mouth daily.      . ferrous sulfate 325 (65 FE) MG tablet Take 325 mg by mouth daily with breakfast.       . hydroxychloroquine (PLAQUENIL) 200 MG tablet Take 200 mg by mouth 2 (two) times daily.       Marland Kitchen ibuprofen (ADVIL,MOTRIN) 800 MG tablet  Take 1 tablet (800 mg total) by mouth every 8 (eight) hours as needed for pain.  30 tablet  3  . lisinopril (PRINIVIL,ZESTRIL) 10 MG tablet Take 1 tablet (10 mg total) by mouth daily.  30 tablet  3  . metoprolol (LOPRESSOR) 50 MG tablet Take 1 tablet (50 mg total) by mouth 2 (two) times daily.  60 tablet  2  . TRAVATAN Z 0.004 % SOLN ophthalmic solution Place 1 drop into both eyes At bedtime.      . tranexamic acid (LYSTEDA) 650 MG TABS tablet Take 2 tablets (1,300 mg total) by mouth 3 (three) times daily.  30 tablet  11   No current facility-administered medications on file prior to visit.    BP 136/90  Pulse 64  Temp(Src) 98.4 F (36.9 C) (Oral)  Resp 16  Ht 5' 5.5" (1.664 m)  Wt 200 lb (90.719 kg)  BMI 32.76 kg/m2  SpO2 99%  LMP 01/29/2013       Objective:   Physical Exam  Constitutional: She is oriented to person, place, and time. She appears well-developed and well-nourished. No distress.  HENT:  Head: Normocephalic and atraumatic.  Cardiovascular: Normal rate and regular rhythm.   No  murmur heard. Pulmonary/Chest: Effort normal and breath sounds normal. No respiratory distress. She has no wheezes. She has no rales. She exhibits no tenderness.  Musculoskeletal: She exhibits no edema.  Neurological: She is alert and oriented to person, place, and time.  Psychiatric: She has a normal mood and affect. Her behavior is normal. Judgment and thought content normal.          Assessment & Plan:

## 2013-03-04 NOTE — Assessment & Plan Note (Signed)
Lab Results  Component Value Date   WBC 3.2* 01/14/2013   HGB 12.2 01/14/2013   HCT 37.5 01/14/2013   MCV 86.3 01/14/2013   PLT 223 01/14/2013   Blood count stable on iron, management per Hematology. She is considering hysterectomy with Dr. Toney Rakes.

## 2013-03-07 ENCOUNTER — Encounter: Payer: Self-pay | Admitting: Family

## 2013-04-08 ENCOUNTER — Ambulatory Visit: Payer: BC Managed Care – PPO | Admitting: Physician Assistant

## 2013-04-08 ENCOUNTER — Encounter: Payer: Self-pay | Admitting: Physician Assistant

## 2013-04-08 ENCOUNTER — Ambulatory Visit (INDEPENDENT_AMBULATORY_CARE_PROVIDER_SITE_OTHER): Payer: BC Managed Care – PPO | Admitting: Physician Assistant

## 2013-04-08 VITALS — BP 148/88 | HR 75 | Temp 98.7°F | Resp 16 | Ht 60.5 in | Wt 203.2 lb

## 2013-04-08 DIAGNOSIS — J019 Acute sinusitis, unspecified: Secondary | ICD-10-CM

## 2013-04-08 MED ORDER — AMOXICILLIN-POT CLAVULANATE 875-125 MG PO TABS: 1.0000 | ORAL_TABLET | Freq: Two times a day (BID) | ORAL | Status: DC

## 2013-04-08 NOTE — Progress Notes (Signed)
Pre visit review using our clinic review tool, if applicable. No additional management support is needed unless otherwise documented below in the visit note/SLS  

## 2013-04-08 NOTE — Assessment & Plan Note (Signed)
POC flu swab negative.  + TTP of sinuses on exam.  Will Rx Augmentin. Increase fluids.  Rest.  Saline nasal spray.  Probiotic.  Plain Mucinex.  Humidifier in bedroom.  Call or return to clinic if symptoms are not improving.

## 2013-04-08 NOTE — Patient Instructions (Signed)
Please take Augmentin as directed with food.  Increase fluid intake.  Use saline nasal spray.  Use plain Mucinex.  Can use Delsym if you develop a cough.  Tylenol for fever.  Humidifier in the bedroom.  Call or return to clinic if symptoms not improving.  Sinusitis Sinusitis is redness, soreness, and swelling (inflammation) of the paranasal sinuses. Paranasal sinuses are air pockets within the bones of your face (beneath the eyes, the middle of the forehead, or above the eyes). In healthy paranasal sinuses, mucus is able to drain out, and air is able to circulate through them by way of your nose. However, when your paranasal sinuses are inflamed, mucus and air can become trapped. This can allow bacteria and other germs to grow and cause infection. Sinusitis can develop quickly and last only a short time (acute) or continue over a long period (chronic). Sinusitis that lasts for more than 12 weeks is considered chronic.  CAUSES  Causes of sinusitis include:  Allergies.  Structural abnormalities, such as displacement of the cartilage that separates your nostrils (deviated septum), which can decrease the air flow through your nose and sinuses and affect sinus drainage.  Functional abnormalities, such as when the small hairs (cilia) that line your sinuses and help remove mucus do not work properly or are not present. SYMPTOMS  Symptoms of acute and chronic sinusitis are the same. The primary symptoms are pain and pressure around the affected sinuses. Other symptoms include:  Upper toothache.  Earache.  Headache.  Bad breath.  Decreased sense of smell and taste.  A cough, which worsens when you are lying flat.  Fatigue.  Fever.  Thick drainage from your nose, which often is green and may contain pus (purulent).  Swelling and warmth over the affected sinuses. DIAGNOSIS  Your caregiver will perform a physical exam. During the exam, your caregiver may:  Look in your nose for signs of  abnormal growths in your nostrils (nasal polyps).  Tap over the affected sinus to check for signs of infection.  View the inside of your sinuses (endoscopy) with a special imaging device with a light attached (endoscope), which is inserted into your sinuses. If your caregiver suspects that you have chronic sinusitis, one or more of the following tests may be recommended:  Allergy tests.  Nasal culture A sample of mucus is taken from your nose and sent to a lab and screened for bacteria.  Nasal cytology A sample of mucus is taken from your nose and examined by your caregiver to determine if your sinusitis is related to an allergy. TREATMENT  Most cases of acute sinusitis are related to a viral infection and will resolve on their own within 10 days. Sometimes medicines are prescribed to help relieve symptoms (pain medicine, decongestants, nasal steroid sprays, or saline sprays).  However, for sinusitis related to a bacterial infection, your caregiver will prescribe antibiotic medicines. These are medicines that will help kill the bacteria causing the infection.  Rarely, sinusitis is caused by a fungal infection. In theses cases, your caregiver will prescribe antifungal medicine. For some cases of chronic sinusitis, surgery is needed. Generally, these are cases in which sinusitis recurs more than 3 times per year, despite other treatments. HOME CARE INSTRUCTIONS   Drink plenty of water. Water helps thin the mucus so your sinuses can drain more easily.  Use a humidifier.  Inhale steam 3 to 4 times a day (for example, sit in the bathroom with the shower running).  Apply a warm,  moist washcloth to your face 3 to 4 times a day, or as directed by your caregiver.  Use saline nasal sprays to help moisten and clean your sinuses.  Take over-the-counter or prescription medicines for pain, discomfort, or fever only as directed by your caregiver. SEEK IMMEDIATE MEDICAL CARE IF:  You have increasing  pain or severe headaches.  You have nausea, vomiting, or drowsiness.  You have swelling around your face.  You have vision problems.  You have a stiff neck.  You have difficulty breathing. MAKE SURE YOU:   Understand these instructions.  Will watch your condition.  Will get help right away if you are not doing well or get worse. Document Released: 12/18/2004 Document Revised: 03/12/2011 Document Reviewed: 01/02/2011 [facility] Patient Information 2014 [place], [place].

## 2013-04-08 NOTE — Progress Notes (Signed)
Patient presents to clinic today c/o several days of sinus pressure, sinus pain, tooth pain, intermittent fevers, chills and muscle aches.  Patient denies cough, SOB or wheezing.  Denies recent travel or sick contact.  Endorses having flu shot this past year.  Past Medical History  Diagnosis Date  . SAB (spontaneous abortion)   . Elective abortion   . Ectopic pregnancy     RIGHT salpingostomy  . Hypertension   . GERD (gastroesophageal reflux disease)   . Anemia   . Sjogren's disease   . Glaucoma     both eyes  . Rheumatoid arthritis(714.0) 11/07/2011  . History of chicken pox     Current Outpatient Prescriptions on File Prior to Visit  Medication Sig Dispense Refill  . cholecalciferol (VITAMIN D) 400 UNITS TABS Take 400 Units by mouth daily.      . ferrous sulfate 325 (65 FE) MG tablet Take 325 mg by mouth daily with breakfast.       . hydroxychloroquine (PLAQUENIL) 200 MG tablet Take 200 mg by mouth 2 (two) times daily.       Marland Kitchen ibuprofen (ADVIL,MOTRIN) 800 MG tablet Take 1 tablet (800 mg total) by mouth every 8 (eight) hours as needed for pain.  30 tablet  3  . lisinopril (PRINIVIL,ZESTRIL) 10 MG tablet Take 1 tablet (10 mg total) by mouth daily.  30 tablet  5  . metoprolol (LOPRESSOR) 50 MG tablet Take 1 tablet (50 mg total) by mouth 2 (two) times daily.  60 tablet  5  . TRAVATAN Z 0.004 % SOLN ophthalmic solution Place 1 drop into both eyes At bedtime.       No current facility-administered medications on file prior to visit.    No Known Allergies  Family History  Problem Relation Age of Onset  . Hypertension Mother     died from heart disease  . Heart disease Mother     deceased, unknown cause  . Diabetes Sister   . Hypertension Sister   . Diabetes Sister     History   Social History  . Marital Status: Single    Spouse Name: N/A    Number of Children: 0  . Years of Education: N/A   Occupational History  .     Social History Main Topics  . Smoking status:  Never Smoker   . Smokeless tobacco: Never Used  . Alcohol Use: No  . Drug Use: No  . Sexual Activity: No   Other Topics Concern  . None   Social History Narrative   Regular exercise:  No   Caffeine Use: 2 cups coffee daily   No children   "Happily divorced"   Reports that she is involved at her church   Works with intellectually challenged adults   Born [place]- she came to [place] as adult.  Age 16.             Review of Systems - See HPI.  All other ROS are negative.  BP 148/88  Pulse 75  Temp(Src) 98.7 F (37.1 C) (Oral)  Resp 16  Ht 5' 0.5" (1.537 m)  Wt 203 lb 4 oz (92.194 kg)  BMI 39.03 kg/m2  SpO2 98%  LMP 03/20/2013  Physical Exam  Vitals reviewed. Constitutional: She is oriented to person, place, and time and well-developed, well-nourished, and in no distress.  HENT:  Head: Normocephalic and atraumatic.  Right Ear: External ear and ear canal normal. Tympanic membrane is retracted.  Left Ear: External ear  and ear canal normal. Tympanic membrane is retracted.  Nose: Right sinus exhibits frontal sinus tenderness. Right sinus exhibits no maxillary sinus tenderness. Left sinus exhibits frontal sinus tenderness. Left sinus exhibits no maxillary sinus tenderness.  Mouth/Throat: Uvula is midline, oropharynx is clear and moist and mucous membranes are normal. No oropharyngeal exudate.  Eyes: Conjunctivae are normal. Pupils are equal, round, and reactive to light.  Neck: Neck supple.  Cardiovascular: Normal rate, regular rhythm, normal heart sounds and intact distal pulses.   Pulmonary/Chest: Effort normal and breath sounds normal. No respiratory distress. She has no wheezes. She has no rales. She exhibits no tenderness.  Lymphadenopathy:    She has no cervical adenopathy.  Neurological: She is alert and oriented to person, place, and time.  Skin: Skin is warm and dry. No rash noted.  Psychiatric: Affect normal.   Recent Results (from the past 2160 hour(s))  CBC WITH  DIFFERENTIAL     Status: Abnormal   Collection Time    01/14/13  3:10 PM      Result Value Ref Range   WBC 3.2 (*) 3.9 - 10.3 10e3/uL   NEUT# 1.0 (*) 1.5 - 6.5 10e3/uL   HGB 12.2  11.6 - 15.9 g/dL   HCT 37.5  34.8 - 46.6 %   Platelets 223  145 - 400 10e3/uL   MCV 86.3  79.5 - 101.0 fL   MCH 28.1  25.1 - 34.0 pg   MCHC 32.6  31.5 - 36.0 g/dL   RBC 4.34  3.70 - 5.45 10e6/uL   RDW 14.8 (*) 11.2 - 14.5 %   lymph# 1.7  0.9 - 3.3 10e3/uL   MONO# 0.4  0.1 - 0.9 10e3/uL   Eosinophils Absolute 0.1  0.0 - 0.5 10e3/uL   Basophils Absolute 0.0  0.0 - 0.1 10e3/uL   NEUT% 31.9 (*) 38.4 - 76.8 %   LYMPH% 53.2 (*) 14.0 - 49.7 %   MONO% 11.0  0.0 - 14.0 %   EOS% 3.3  0.0 - 7.0 %   BASO% 0.6  0.0 - 2.0 %  FERRITIN [facility]     Status: None   Collection Time    01/14/13  3:10 PM      Result Value Ref Range   Ferritin 59  9 - 269 ng/ml  IRON AND TIBC [facility]     Status: None   Collection Time    01/14/13  3:10 PM      Result Value Ref Range   Iron 63  41 - 142 ug/dL   TIBC 271  236 - 444 ug/dL   UIBC 208  120 - 384 ug/dL   %SAT 23  21 - 57 %  HEMOGLOBIN A1C     Status: Abnormal   Collection Time    03/04/13  9:30 AM      Result Value Ref Range   Hemoglobin A1C 5.9 (*) <5.7 %   Comment:                                                                            According to the ADA Clinical Practice Recommendations for 2011, when     HbA1c is used as a screening test:             >=  6.5%   Diagnostic of Diabetes Mellitus                (if abnormal result is confirmed)           5.7-6.4%   Increased risk of developing Diabetes Mellitus           References:Diagnosis and Classification of Diabetes Mellitus,Diabetes     HUDJ,4970,26(VZCHY 1):S62-S69 and Standards of Medical Care in             Diabetes - 2011,Diabetes Care,2011,34 (Suppl 1):S11-S61.         Mean Plasma Glucose 123 (*) <117 mg/dL  LIPID PANEL     Status: None   Collection Time    03/04/13  9:30 AM      Result Value Ref Range    Cholesterol 127  0 - 200 mg/dL   Comment: ATP III Classification:           < 200        mg/dL        Desirable          200 - 239     mg/dL        Borderline High          >= 240        mg/dL        High         Triglycerides 53  <150 mg/dL   HDL 44  >39 mg/dL   Total CHOL/HDL Ratio 2.9     VLDL 11  0 - 40 mg/dL   LDL Cholesterol 72  0 - 99 mg/dL   Comment:       Total Cholesterol/HDL Ratio:CHD Risk                            Coronary Heart Disease Risk Table                                            Men       Women              1/2 Average Risk              3.4        3.3                  Average Risk              5.0        4.4               2X Average Risk              9.6        7.1               3X Average Risk             23.4       11.0     Use the calculated Patient Ratio above and the CHD Risk table      to determine the patient's CHD Risk.     ATP III Classification (LDL):           < 100        mg/dL         Optimal  100 - 129     mg/dL         Near or Above Optimal          130 - 159     mg/dL         Borderline High          160 - 189     mg/dL         High           > 190        mg/dL         Very High          Assessment/Plan: Acute sinusitis POC flu swab negative.  + TTP of sinuses on exam.  Will Rx Augmentin. Increase fluids.  Rest.  Saline nasal spray.  Probiotic.  Plain Mucinex.  Humidifier in bedroom.  Call or return to clinic if symptoms are not improving.     ds

## 2013-05-13 ENCOUNTER — Ambulatory Visit (HOSPITAL_BASED_OUTPATIENT_CLINIC_OR_DEPARTMENT_OTHER): Payer: BC Managed Care – PPO | Admitting: Oncology

## 2013-05-13 ENCOUNTER — Other Ambulatory Visit (HOSPITAL_BASED_OUTPATIENT_CLINIC_OR_DEPARTMENT_OTHER): Payer: BC Managed Care – PPO

## 2013-05-13 ENCOUNTER — Telehealth: Payer: Self-pay | Admitting: Oncology

## 2013-05-13 VITALS — BP 159/99 | HR 87 | Temp 98.3°F | Resp 18 | Ht 60.0 in | Wt 205.0 lb

## 2013-05-13 DIAGNOSIS — N920 Excessive and frequent menstruation with regular cycle: Secondary | ICD-10-CM

## 2013-05-13 DIAGNOSIS — D649 Anemia, unspecified: Secondary | ICD-10-CM

## 2013-05-13 DIAGNOSIS — D72819 Decreased white blood cell count, unspecified: Secondary | ICD-10-CM

## 2013-05-13 DIAGNOSIS — D500 Iron deficiency anemia secondary to blood loss (chronic): Secondary | ICD-10-CM

## 2013-05-13 LAB — COMPREHENSIVE METABOLIC PANEL (CC13)
ALT: 19 U/L (ref 0–55)
AST: 18 U/L (ref 5–34)
Albumin: 3.2 g/dL — ABNORMAL LOW (ref 3.5–5.0)
Alkaline Phosphatase: 50 U/L (ref 40–150)
Anion Gap: 10 mEq/L (ref 3–11)
BILIRUBIN TOTAL: 0.71 mg/dL (ref 0.20–1.20)
BUN: 9.7 mg/dL (ref 7.0–26.0)
CO2: 21 mEq/L — ABNORMAL LOW (ref 22–29)
Calcium: 9.7 mg/dL (ref 8.4–10.4)
Chloride: 108 mEq/L (ref 98–109)
Creatinine: 0.8 mg/dL (ref 0.6–1.1)
GLUCOSE: 122 mg/dL (ref 70–140)
Potassium: 3.9 mEq/L (ref 3.5–5.1)
Sodium: 139 mEq/L (ref 136–145)
Total Protein: 9.5 g/dL — ABNORMAL HIGH (ref 6.4–8.3)

## 2013-05-13 LAB — FERRITIN CHCC: FERRITIN: 21 ng/mL (ref 9–269)

## 2013-05-13 LAB — CBC WITH DIFFERENTIAL/PLATELET
BASO%: 0.4 % (ref 0.0–2.0)
BASOS ABS: 0 10*3/uL (ref 0.0–0.1)
EOS ABS: 0.1 10*3/uL (ref 0.0–0.5)
EOS%: 2 % (ref 0.0–7.0)
HEMATOCRIT: 37.3 % (ref 34.8–46.6)
HEMOGLOBIN: 12.1 g/dL (ref 11.6–15.9)
LYMPH%: 48.2 % (ref 14.0–49.7)
MCH: 27.4 pg (ref 25.1–34.0)
MCHC: 32.4 g/dL (ref 31.5–36.0)
MCV: 84.4 fL (ref 79.5–101.0)
MONO#: 0.3 10*3/uL (ref 0.1–0.9)
MONO%: 10.4 % (ref 0.0–14.0)
NEUT%: 39 % (ref 38.4–76.8)
NEUTROS ABS: 1 10*3/uL — AB (ref 1.5–6.5)
PLATELETS: 212 10*3/uL (ref 145–400)
RBC: 4.42 10*6/uL (ref 3.70–5.45)
RDW: 14 % (ref 11.2–14.5)
WBC: 2.5 10*3/uL — ABNORMAL LOW (ref 3.9–10.3)
lymph#: 1.2 10*3/uL (ref 0.9–3.3)

## 2013-05-13 NOTE — Telephone Encounter (Signed)
gv pt appt schedule for nov.  °

## 2013-05-13 NOTE — Progress Notes (Signed)
Hematology and Oncology Follow Up Visit  Amber [name] [id number] 12-24-1960 53 y.o. 05/13/2013 9:30 AM   Principle Diagnosis: 53 year old woman with iron deficiency anemia as well as fluctuating reactive leukocytopenia was diagnosed in April of 2014.  Prior Therapy: She is status post IV iron infusion in the form of Feraheme given on 05/05/2012 and repeated in 10/2012.   Current therapy: Oral iron supplements on a daily basis.  Interim History: Amber [name] presents today for a followup visit. She is not reporting any new problems at this time. She still have diffuse arthralgias and myalgias related to her autoimmune disorder. She is reporting some improvement in fatigue after IV iron. She is reporting a decline in her menstrual cycles as she approaches menopause. She has not reported any chest pain or shortness of breath. Has not reported any other bleeding such as GI bleeding or GU bleeding. She continues to work full-time and it tends to activities of daily living. He is not reporting any headaches or blurry vision or double vision. She has not reported any hematochezia or melena. He has not reported any recurrent infections. Rest or view of system is unremarkable.  Medications: I have reviewed the patient's current medications. No change is noted and she has stopped taking Augmentin. Current Outpatient Prescriptions  Medication Sig Dispense Refill  . amoxicillin-clavulanate (AUGMENTIN) 875-125 MG per tablet Take 1 tablet by mouth 2 (two) times daily.  20 tablet  0  . cholecalciferol (VITAMIN D) 400 UNITS TABS Take 400 Units by mouth daily.      . ferrous sulfate 325 (65 FE) MG tablet Take 325 mg by mouth daily with breakfast.       . hydroxychloroquine (PLAQUENIL) 200 MG tablet Take 200 mg by mouth 2 (two) times daily.       Marland Kitchen ibuprofen (ADVIL,MOTRIN) 800 MG tablet Take 1 tablet (800 mg total) by mouth every 8 (eight) hours as needed for pain.  30 tablet  3  . lisinopril  (PRINIVIL,ZESTRIL) 10 MG tablet Take 1 tablet (10 mg total) by mouth daily.  30 tablet  5  . metoprolol (LOPRESSOR) 50 MG tablet Take 1 tablet (50 mg total) by mouth 2 (two) times daily.  60 tablet  5  . TRAVATAN Z 0.004 % SOLN ophthalmic solution Place 1 drop into both eyes At bedtime.       No current facility-administered medications for this visit.     Allergies: No Known Allergies    Physical Exam: Blood pressure 159/99, pulse 87, temperature 98.3 F (36.8 C), temperature source Oral, resp. rate 18, height 5' (1.524 m), weight 205 lb (92.987 kg), SpO2 100.00%. ECOG: 0 General appearance: alert awake not in any distress. Head: Normocephalic, atraumatic. Neck: no adenopathy or masses. Lymph nodes: Cervical, supraclavicular, and axillary nodes normal. Heart:regular rate and rhythm, S1, S2 normal, no murmur, click, rub or gallop Lung:chest clear, no wheezing, rales, normal symmetric air entry Abdomen: soft, non-tender, without masses or organomegaly EXT:no erythema, or edema.   Lab Results: Lab Results  Component Value Date   WBC 2.5* 05/13/2013   HGB 12.1 05/13/2013   HCT 37.3 05/13/2013   MCV 84.4 05/13/2013   PLT 212 05/13/2013     Chemistry      Component Value Date/Time   NA 136 11/25/2012 0846   NA 139 09/22/2012 0933   K 4.3 11/25/2012 0846   K 3.8 09/22/2012 0933   CL 103 11/25/2012 0846   CL 108* 06/25/2012 1508   CO2 24  11/25/2012 0846   CO2 25 09/22/2012 0933   BUN 9 11/25/2012 0846   BUN 9.0 09/22/2012 0933   CREATININE 0.73 11/25/2012 0846   CREATININE 0.8 09/22/2012 0933      Component Value Date/Time   CALCIUM 9.5 11/25/2012 0846   CALCIUM 9.1 09/22/2012 0933   ALKPHOS 45 09/22/2012 0933   ALKPHOS 46 11/05/2011 1309   AST 17 09/22/2012 0933   AST 14 11/05/2011 1309   ALT 16 09/22/2012 0933   ALT 15 11/05/2011 1309   BILITOT 0.35 09/22/2012 0933   BILITOT 0.4 11/05/2011 1309       Impression and Plan:  53 year old woman with the following issues:  1.  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 Her last set of iron studies 4 months ago appeared normal.  2. Leukocytopenia: Her absolute neutrophil count of 1000. Again this is most likely reactive in nature and likely related to her out of immune disease or Plaquenil but we will continue to monitor closely to make sure this is not a sign of a lymphoproliferative disorder.  3. Followup will be in 6  months time to reassess her red cell and white cell count.  Amber [name] 5/13/20159:30 AM

## 2013-06-12 ENCOUNTER — Ambulatory Visit: Payer: BC Managed Care – PPO | Admitting: Family

## 2013-06-26 ENCOUNTER — Ambulatory Visit (HOSPITAL_BASED_OUTPATIENT_CLINIC_OR_DEPARTMENT_OTHER)
Admission: RE | Admit: 2013-06-26 | Discharge: 2013-06-26 | Disposition: A | Discharge status: Home / Self Care | Payer: BC Managed Care – PPO | Source: Ambulatory Visit | Attending: Family | Admitting: Family

## 2013-06-26 ENCOUNTER — Ambulatory Visit (INDEPENDENT_AMBULATORY_CARE_PROVIDER_SITE_OTHER): Payer: BC Managed Care – PPO | Admitting: Family

## 2013-06-26 ENCOUNTER — Encounter: Payer: Self-pay | Admitting: Family

## 2013-06-26 VITALS — BP 122/78 | HR 65 | Temp 98.5°F | Resp 16 | Ht 65.5 in | Wt 205.1 lb

## 2013-06-26 DIAGNOSIS — M25559 Pain in unspecified hip: Secondary | ICD-10-CM

## 2013-06-26 DIAGNOSIS — M25552 Pain in left hip: Secondary | ICD-10-CM

## 2013-06-26 DIAGNOSIS — M069 Rheumatoid arthritis, unspecified: Secondary | ICD-10-CM | POA: Insufficient documentation

## 2013-06-26 DIAGNOSIS — I10 Essential (primary) hypertension: Secondary | ICD-10-CM

## 2013-06-26 MED ORDER — MELOXICAM 7.5 MG PO TABS: 7.5000 mg | ORAL_TABLET | Freq: Every day | ORAL | Status: DC

## 2013-06-26 NOTE — Progress Notes (Signed)
Subjective:    Patient ID: Amber [name], female    DOB: [birth year], 53 y.o.   MRN: [id number]  HPI  Amber [name] is a 53 yr old female who presents today for follow up.  1) HTN- she is maintianed on lopressor and lisinopril.  2) Hyperglycemia-   Lab Results  Component Value Date   HGBA1C 5.9* 03/04/2013   3) Hip pain- She is followed by [name] (rheumatology).  Pain is located in the left hip.  She sees [name] and last visit was in March.  Reports that her hip started hurting >1 month ago.  Reports that if she is sleeping and night and rolls to left side the pain will wake her from her sleep. Denies injury.    Corneal abrasion- seeing Dr. Gershon Crane.  Sees him again on Tuesday. Reports that she does not know how she injured her eye, but that her vision is improving.  Review of Systems See HPI  Past Medical History  Diagnosis Date  . SAB (spontaneous abortion)   . Elective abortion   . Ectopic pregnancy     RIGHT salpingostomy  . Hypertension   . GERD (gastroesophageal reflux disease)   . Anemia   . Sjogren's disease   . Glaucoma     both eyes  . Rheumatoid arthritis(714.0) 11/07/2011  . History of chicken pox     History   Social History  . Marital Status: Single    Spouse Name: N/A    Number of Children: 0  . Years of Education: N/A   Occupational History  .     Social History Main Topics  . Smoking status: Never Smoker   . Smokeless tobacco: Never Used  . Alcohol Use: No  . Drug Use: No  . Sexual Activity: No   Other Topics Concern  . Not on file   Social History Narrative   Regular exercise:  No   Caffeine Use: 2 cups coffee daily   No children   "Happily divorced"   Reports that she is involved at her church   Works with intellectually challenged adults   Born [place]- she came to [place] as adult.  Age 27.              Past Surgical History  Procedure Laterality Date  . Dilation and curettage of uterus      X 2  . Knee surgery        MENISCUS TEAR REPAIR  . Pelvic laparoscopy  1994    IN 1999 LAPAROSCOPY WITH INCIDENTAL ENTEROTOMY REQUIRING LAPAROTOMY  . Abdominal surgery      bowel repair    Family History  Problem Relation Age of Onset  . Hypertension Mother     died from heart disease  . Heart disease Mother     deceased, unknown cause  . Diabetes Sister   . Hypertension Sister   . Diabetes Sister     No Known Allergies  Current Outpatient Prescriptions on File Prior to Visit  Medication Sig Dispense Refill  . cholecalciferol (VITAMIN D) 400 UNITS TABS Take 400 Units by mouth daily.      . ferrous sulfate 325 (65 FE) MG tablet Take 325 mg by mouth daily with breakfast.       . hydroxychloroquine (PLAQUENIL) 200 MG tablet Take 200 mg by mouth 2 (two) times daily.       Marland Kitchen ibuprofen (ADVIL,MOTRIN) 800 MG tablet Take 1 tablet (800 mg total) by mouth  every 8 (eight) hours as needed for pain.  30 tablet  3  . lisinopril (PRINIVIL,ZESTRIL) 10 MG tablet Take 1 tablet (10 mg total) by mouth daily.  30 tablet  5  . metoprolol (LOPRESSOR) 50 MG tablet Take 1 tablet (50 mg total) by mouth 2 (two) times daily.  60 tablet  5  . TRAVATAN Z 0.004 % SOLN ophthalmic solution Place 1 drop into both eyes At bedtime.       No current facility-administered medications on file prior to visit.    BP 122/78  Pulse 65  Temp(Src) 98.5 F (36.9 C) (Oral)  Resp 16  Ht 5' 5.5" (1.664 m)  Wt 205 lb 1.3 oz (93.024 kg)  BMI 33.60 kg/m2  SpO2 99%  LMP 03/01/2013       Objective:   Physical Exam  Constitutional: She is oriented to person, place, and time. She appears well-developed and well-nourished. No distress.  Cardiovascular: Normal rate and regular rhythm.   No murmur heard. Pulmonary/Chest: Effort normal and breath sounds normal. No respiratory distress. She has no wheezes. She has no rales. She exhibits no tenderness.  Musculoskeletal:  Mild L hip tenderness to palpation.  Increased pain with flexion and  adduction.  Neurological: She is alert and oriented to person, place, and time.  Psychiatric: She has a normal mood and affect. Her behavior is normal. Judgment and thought content normal.          Assessment & Plan:

## 2013-06-26 NOTE — Assessment & Plan Note (Signed)
Left hip pain x 1 month.  D/c ibuprofen, trial of meloxicam, obtain x ray of the left hip.

## 2013-06-26 NOTE — Assessment & Plan Note (Addendum)
BP stable on current meds. Continue same.  

## 2013-06-26 NOTE — Patient Instructions (Addendum)
Please complete x ray on the first floor.   Stop ibuprofen, start meloxicam.  When you complete meloxicam, try to switch to as needed tylenol. Follow up in 3 months.

## 2013-06-26 NOTE — Progress Notes (Signed)
Pre visit review using our clinic review tool, if applicable. No additional management support is needed unless otherwise documented below in the visit note. 

## 2013-08-31 ENCOUNTER — Telehealth: Payer: Self-pay | Admitting: Medical Oncology

## 2013-08-31 NOTE — Telephone Encounter (Signed)
Patient called requesting earlier appt with Dr. Alen Blew than November. States she has been having increased weakness for the past 3 weeks and recently saw [name] and had labs drawn that resulted with critical values. Informed patient will contact Dr. [name] Noon office and have them fax lab results to our office and give to Dr. Alen Blew for review and then will contact her regarding any changes needed in her appt. Patient expressed thanks.   LVMOM with Med Rec at Dr. [name] Noon office requesting most frequent labs faxed to [phone].   appt 11/13/2013 lab/MD

## 2013-09-01 ENCOUNTER — Encounter: Payer: Self-pay | Admitting: *Deleted

## 2013-09-01 ENCOUNTER — Telehealth: Payer: Self-pay | Admitting: Oncology

## 2013-09-01 NOTE — Progress Notes (Signed)
Spoke with patient, gave her appt for lab @ 8:30 and dr visit @ 9:00 on 09/02/13. pof to scheduler

## 2013-09-01 NOTE — Telephone Encounter (Signed)
added appt per pof...per [place] pt is already aware

## 2013-09-02 ENCOUNTER — Encounter: Payer: Self-pay | Admitting: Oncology

## 2013-09-02 ENCOUNTER — Telehealth: Payer: Self-pay | Admitting: Oncology

## 2013-09-02 ENCOUNTER — Ambulatory Visit (HOSPITAL_BASED_OUTPATIENT_CLINIC_OR_DEPARTMENT_OTHER): Payer: BC Managed Care – PPO | Admitting: Oncology

## 2013-09-02 ENCOUNTER — Other Ambulatory Visit (HOSPITAL_BASED_OUTPATIENT_CLINIC_OR_DEPARTMENT_OTHER): Payer: BC Managed Care – PPO

## 2013-09-02 VITALS — BP 135/83 | HR 81 | Temp 99.2°F | Resp 18 | Ht 65.5 in | Wt 203.8 lb

## 2013-09-02 DIAGNOSIS — D72819 Decreased white blood cell count, unspecified: Secondary | ICD-10-CM

## 2013-09-02 DIAGNOSIS — D649 Anemia, unspecified: Secondary | ICD-10-CM

## 2013-09-02 DIAGNOSIS — D509 Iron deficiency anemia, unspecified: Secondary | ICD-10-CM

## 2013-09-02 DIAGNOSIS — N920 Excessive and frequent menstruation with regular cycle: Secondary | ICD-10-CM

## 2013-09-02 DIAGNOSIS — D61818 Other pancytopenia: Secondary | ICD-10-CM

## 2013-09-02 HISTORY — DX: Iron deficiency anemia, unspecified: D50.9

## 2013-09-02 LAB — CBC WITH DIFFERENTIAL/PLATELET
BASO%: 0.2 % (ref 0.0–2.0)
Basophils Absolute: 0 10*3/uL (ref 0.0–0.1)
EOS ABS: 0 10*3/uL (ref 0.0–0.5)
EOS%: 2.1 % (ref 0.0–7.0)
HCT: 33.6 % — ABNORMAL LOW (ref 34.8–46.6)
HGB: 10.6 g/dL — ABNORMAL LOW (ref 11.6–15.9)
LYMPH%: 63.3 % — ABNORMAL HIGH (ref 14.0–49.7)
MCH: 26.1 pg (ref 25.1–34.0)
MCHC: 31.5 g/dL (ref 31.5–36.0)
MCV: 82.9 fL (ref 79.5–101.0)
MONO#: 0.2 10*3/uL (ref 0.1–0.9)
MONO%: 10.3 % (ref 0.0–14.0)
NEUT#: 0.5 10*3/uL — CL (ref 1.5–6.5)
NEUT%: 24.1 % — AB (ref 38.4–76.8)
Platelets: 265 10*3/uL (ref 145–400)
RBC: 4.06 10*6/uL (ref 3.70–5.45)
RDW: 14.5 % (ref 11.2–14.5)
WBC: 2.1 10*3/uL — AB (ref 3.9–10.3)
lymph#: 1.3 10*3/uL (ref 0.9–3.3)

## 2013-09-02 LAB — IRON AND TIBC CHCC
%SAT: 8 % — ABNORMAL LOW (ref 21–57)
Iron: 29 ug/dL — ABNORMAL LOW (ref 41–142)
TIBC: 341 ug/dL (ref 236–444)
UIBC: 312 ug/dL (ref 120–384)

## 2013-09-02 LAB — COMPREHENSIVE METABOLIC PANEL (CC13)
ALT: 18 U/L (ref 0–55)
ANION GAP: 5 meq/L (ref 3–11)
AST: 21 U/L (ref 5–34)
Albumin: 3.2 g/dL — ABNORMAL LOW (ref 3.5–5.0)
Alkaline Phosphatase: 51 U/L (ref 40–150)
BUN: 10 mg/dL (ref 7.0–26.0)
CALCIUM: 8.9 mg/dL (ref 8.4–10.4)
CO2: 23 meq/L (ref 22–29)
CREATININE: 0.8 mg/dL (ref 0.6–1.1)
Chloride: 109 mEq/L (ref 98–109)
Glucose: 104 mg/dl (ref 70–140)
Potassium: 4 mEq/L (ref 3.5–5.1)
Sodium: 138 mEq/L (ref 136–145)
Total Bilirubin: 0.43 mg/dL (ref 0.20–1.20)
Total Protein: 9.1 g/dL — ABNORMAL HIGH (ref 6.4–8.3)

## 2013-09-02 LAB — FERRITIN CHCC: Ferritin: 12 ng/ml (ref 9–269)

## 2013-09-02 NOTE — Telephone Encounter (Signed)
gv adn printed appts sched and avs for pt for Sept...sed added tx.

## 2013-09-02 NOTE — Progress Notes (Signed)
Hematology and Oncology Follow Up Visit  Amber [name] [id number] July 24, 1960 53 y.o. 09/02/2013 9:07 AM   Principle Diagnosis: 53 year old woman with iron deficiency anemia as well as fluctuating reactive leukocytopenia was diagnosed in April of 2014.  Prior Therapy: She is status post IV iron infusion in the form of Feraheme given on 05/05/2012 and repeated in 10/2012.   Current therapy: Oral iron supplements on a daily basis.   Interim History: Ms. Amber [name] presents today for a followup visit. Since the last visit, she reports no major changes. She does report slight more fatigued and continues to have chronic pain related to her rheumatoid arthritis. She still have diffuse arthralgias and myalgias related to her autoimmune disorder. She is reporting some improvement in fatigue after IV iron. She is reporting a decline in her menstrual cycles as she approaches menopause. She has not reported any chest pain or shortness of breath. Has not reported any other bleeding such as GI bleeding or GU bleeding. She continues to work full-time and it tends to activities of daily living. He is not reporting any headaches or blurry vision or double vision. She has not reported any hematochezia or melena. He has not reported any recurrent infections. She does not report any constitutional symptoms of fevers chills or sweats. Has not reported any recent hospitalizations or illnesses. Rest or view of system is unremarkable.  Medications: I have reviewed the patient's current medications. No change is noted and she has stopped taking Augmentin. Current Outpatient Prescriptions  Medication Sig Dispense Refill  . cholecalciferol (VITAMIN D) 400 UNITS TABS Take 400 Units by mouth daily.      . ferrous sulfate 325 (65 FE) MG tablet Take 325 mg by mouth daily with breakfast.       . hydroxychloroquine (PLAQUENIL) 200 MG tablet Take 200 mg by mouth 2 (two) times daily.       Marland Kitchen lisinopril (PRINIVIL,ZESTRIL) 10 MG  tablet Take 1 tablet (10 mg total) by mouth daily.  30 tablet  5  . meloxicam (MOBIC) 7.5 MG tablet Take 1 tablet (7.5 mg total) by mouth daily.  14 tablet  0  . metoprolol (LOPRESSOR) 50 MG tablet Take 1 tablet (50 mg total) by mouth 2 (two) times daily.  60 tablet  5  . Potassium 75 MG TABS Take 20 mEq by mouth daily.      . TRAVATAN Z 0.004 % SOLN ophthalmic solution Place 1 drop into both eyes At bedtime.       No current facility-administered medications for this visit.     Allergies: No Known Allergies    Physical Exam: Blood pressure 135/83, pulse 81, temperature 99.2 F (37.3 C), temperature source Oral, resp. rate 18, height 5' 5.5" (1.664 m), weight 203 lb 12.8 oz (92.443 kg), SpO2 100.00%. ECOG: 0 General appearance: alert awake not in any distress. Head: Normocephalic, atraumatic. Neck: no adenopathy or masses. Lymph nodes: Cervical, supraclavicular, and axillary nodes normal. Heart:regular rate and rhythm, S1, S2 normal, no murmur, click, rub or gallop Lung:chest clear, no wheezing, rales, normal symmetric air entry Abdomen: soft, non-tender, without masses or organomegaly EXT:no erythema, or edema.   Lab Results: Lab Results  Component Value Date   WBC 2.1* 09/02/2013   HGB 10.6* 09/02/2013   HCT 33.6* 09/02/2013   MCV 82.9 09/02/2013   PLT 265 09/02/2013     Chemistry      Component Value Date/Time   NA 139 05/13/2013 0910   NA 136 11/25/2012 0846  K 3.9 05/13/2013 0910   K 4.3 11/25/2012 0846   CL 103 11/25/2012 0846   CL 108* 06/25/2012 1508   CO2 21* 05/13/2013 0910   CO2 24 11/25/2012 0846   BUN 9.7 05/13/2013 0910   BUN 9 11/25/2012 0846   CREATININE 0.8 05/13/2013 0910   CREATININE 0.73 11/25/2012 0846      Component Value Date/Time   CALCIUM 9.7 05/13/2013 0910   CALCIUM 9.5 11/25/2012 0846   ALKPHOS 50 05/13/2013 0910   ALKPHOS 46 11/05/2011 1309   AST 18 05/13/2013 0910   AST 14 11/05/2011 1309   ALT 19 05/13/2013 0910   ALT 15 11/05/2011 1309   BILITOT  0.71 05/13/2013 0910   BILITOT 0.4 11/05/2011 1309       Impression and Plan:  53 year old woman with the following issues:  1. 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 She tolerated IV iron in the past and she is willing to receive it again. Risks and benefits of Feraheme were discussed including potential inferiorly related complications and rarely anaphylaxis. We will set that up for her in the near future.  2. Leukocytopenia: Her absolute neutrophil count of 500. She does have some lymphocytosis as well which has been chronic. The most likely etiology we are dealing with his leukopenia related to autoimmune arthritis as well as potentially Plaquenil related. I cannot rule out a myeloproliferative or myelodysplastic disorder. She could also have a lymphoproliferative disorder that is contributing. The only way to determine that, would be a bone marrow biopsy. Risks and benefits of this procedure was discussed with the patient today. Complications would include bleeding, pain, infection were discussed and she is ready to proceed. I will set that up for her under image guidance in interventional radiology and a quick followup to discuss the results.  3. Followup will be in few weeks after her bone marrow biopsy to discuss the results.  [facility] 9/2/20159:07 AM

## 2013-09-03 ENCOUNTER — Other Ambulatory Visit: Payer: Self-pay | Admitting: Radiology

## 2013-09-04 ENCOUNTER — Ambulatory Visit (HOSPITAL_BASED_OUTPATIENT_CLINIC_OR_DEPARTMENT_OTHER): Payer: BC Managed Care – PPO

## 2013-09-04 ENCOUNTER — Encounter (HOSPITAL_COMMUNITY): Payer: Self-pay | Admitting: Pharmacy Technician

## 2013-09-04 VITALS — BP 133/77 | HR 67 | Temp 98.5°F | Resp 18

## 2013-09-04 DIAGNOSIS — D508 Other iron deficiency anemias: Secondary | ICD-10-CM

## 2013-09-04 DIAGNOSIS — D509 Iron deficiency anemia, unspecified: Secondary | ICD-10-CM

## 2013-09-04 MED ORDER — SODIUM CHLORIDE 0.9 % IV SOLN
1020.0000 mg | Freq: Once | INTRAVENOUS | Status: AC
  Administered 2013-09-04: 1020 mg via INTRAVENOUS
  Filled 2013-09-04: qty 34

## 2013-09-04 MED ORDER — SODIUM CHLORIDE 0.9 % IV SOLN
Freq: Once | INTRAVENOUS | Status: AC
  Administered 2013-09-04: 15:00:00 via INTRAVENOUS

## 2013-09-04 NOTE — Progress Notes (Signed)
Pt refused flu vaccine. Will followup at next appt.

## 2013-09-04 NOTE — Patient Instructions (Signed)

## 2013-09-08 ENCOUNTER — Other Ambulatory Visit: Payer: Self-pay | Admitting: Radiology

## 2013-09-09 ENCOUNTER — Ambulatory Visit (HOSPITAL_COMMUNITY)
Admission: RE | Admit: 2013-09-09 | Discharge: 2013-09-09 | Disposition: A | Discharge status: Home / Self Care | Payer: BC Managed Care – PPO | Source: Ambulatory Visit | Attending: Interventional Radiology | Admitting: Interventional Radiology

## 2013-09-09 ENCOUNTER — Encounter (HOSPITAL_COMMUNITY): Payer: Self-pay

## 2013-09-09 ENCOUNTER — Ambulatory Visit (HOSPITAL_COMMUNITY)
Admission: RE | Admit: 2013-09-09 | Discharge: 2013-09-09 | Disposition: A | Discharge status: Home / Self Care | Payer: BC Managed Care – PPO | Source: Ambulatory Visit | Attending: Oncology | Admitting: Oncology

## 2013-09-09 DIAGNOSIS — D61818 Other pancytopenia: Secondary | ICD-10-CM | POA: Diagnosis not present

## 2013-09-09 DIAGNOSIS — D72819 Decreased white blood cell count, unspecified: Secondary | ICD-10-CM | POA: Insufficient documentation

## 2013-09-09 DIAGNOSIS — D509 Iron deficiency anemia, unspecified: Secondary | ICD-10-CM | POA: Insufficient documentation

## 2013-09-09 LAB — BONE MARROW EXAM

## 2013-09-09 LAB — CBC
HEMATOCRIT: 36.2 % (ref 36.0–46.0)
Hemoglobin: 11.7 g/dL — ABNORMAL LOW (ref 12.0–15.0)
MCH: 27 pg (ref 26.0–34.0)
MCHC: 32.3 g/dL (ref 30.0–36.0)
MCV: 83.4 fL (ref 78.0–100.0)
Platelets: 306 10*3/uL (ref 150–400)
RBC: 4.34 MIL/uL (ref 3.87–5.11)
RDW: 14.3 % (ref 11.5–15.5)
WBC: 2.4 10*3/uL — AB (ref 4.0–10.5)

## 2013-09-09 LAB — PROTIME-INR
INR: 1.01 (ref 0.00–1.49)
Prothrombin Time: 13.3 seconds (ref 11.6–15.2)

## 2013-09-09 LAB — HCG, SERUM, QUALITATIVE: PREG SERUM: NEGATIVE

## 2013-09-09 LAB — APTT: aPTT: 26 seconds (ref 24–37)

## 2013-09-09 MED ORDER — FENTANYL CITRATE 0.05 MG/ML IJ SOLN
INTRAMUSCULAR | Status: AC
  Filled 2013-09-09: qty 6

## 2013-09-09 MED ORDER — MIDAZOLAM HCL 2 MG/2ML IJ SOLN
INTRAMUSCULAR | Status: AC | PRN
  Administered 2013-09-09 (×3): 1 mg via INTRAVENOUS

## 2013-09-09 MED ORDER — MIDAZOLAM HCL 2 MG/2ML IJ SOLN
INTRAMUSCULAR | Status: AC
  Filled 2013-09-09: qty 6

## 2013-09-09 MED ORDER — FENTANYL CITRATE 0.05 MG/ML IJ SOLN
INTRAMUSCULAR | Status: AC | PRN
  Administered 2013-09-09: 25 ug via INTRAVENOUS
  Administered 2013-09-09: 50 ug via INTRAVENOUS
  Administered 2013-09-09: 25 ug via INTRAVENOUS

## 2013-09-09 MED ORDER — SODIUM CHLORIDE 0.9 % IV SOLN
INTRAVENOUS | Status: DC
  Administered 2013-09-09: 07:00:00 via INTRAVENOUS

## 2013-09-09 NOTE — Procedures (Signed)
Procedure:  CT guided right iliac BM biopsy Findings:  Aspirate and core biopsy of right iliac BM.  No complications.

## 2013-09-09 NOTE — Sedation Documentation (Signed)
Medication dose calculated and verified for:  8 mins. Versed 3mg , Fentanyl 177mcg.

## 2013-09-09 NOTE — H&P (Signed)
Agree.  For CT guided bone marrow biopsy today.  Patient seen and examined.

## 2013-09-09 NOTE — Discharge Instructions (Signed)
Conscious Sedation °Sedation is the use of medicines to promote relaxation and relieve discomfort and anxiety. Conscious sedation is a type of sedation. Under conscious sedation you are less alert than normal but are still able to respond to instructions or stimulation. Conscious sedation is used during short medical and dental procedures. It is milder than deep sedation or general anesthesia and allows you to return to your regular activities sooner.  °LET YOUR HEALTH CARE PROVIDER KNOW ABOUT:  °· Any allergies you have. °· All medicines you are taking, including vitamins, herbs, eye drops, creams, and over-the-counter medicines. °· Use of steroids (by mouth or creams). °· Previous problems you or members of your family have had with the use of anesthetics. °· Any blood disorders you have. °· Previous surgeries you have had. °· Medical conditions you have. °· Possibility of pregnancy, if this applies. °· Use of cigarettes, alcohol, or illegal drugs. °RISKS AND COMPLICATIONS °Generally, this is a safe procedure. However, as with any procedure, problems can occur. Possible problems include: °· Oversedation. °· Trouble breathing on your own. You may need to have a breathing tube until you are awake and breathing on your own. °· Allergic reaction to any of the medicines used for the procedure. °BEFORE THE PROCEDURE °· You may have blood tests done. These tests can help show how well your kidneys and liver are working. They can also show how well your blood clots. °· A physical exam will be done.   °· Only take medicines as directed by your health care provider. You may need to stop taking medicines (such as blood thinners, aspirin, or nonsteroidal anti-inflammatory drugs) before the procedure.   °· Do not eat or drink at least 6 hours before the procedure or as directed by your health care provider. °· Arrange for a responsible adult, family member, or friend to take you home after the procedure. He or she should stay  with you for at least 24 hours after the procedure, until the medicine has worn off. °PROCEDURE  °· An intravenous (IV) catheter will be inserted into one of your veins. Medicine will be able to flow directly into your body through this catheter. You may be given medicine through this tube to help prevent pain and help you relax. °· The medical or dental procedure will be done. °AFTER THE PROCEDURE °· You will stay in a recovery area until the medicine has worn off. Your blood pressure and pulse will be checked.   °·  Depending on the procedure you had, you may be allowed to go home when you can tolerate liquids and your pain is under control. °Document Released: 09/12/2000 Document Revised: 12/23/2012 Document Reviewed: 08/25/2012 °ExitCare® Patient Information ©2015 [employer]. This information is not intended to replace advice given to you by your health care provider. Make sure you discuss any questions you have with your health care provider. °Conscious Sedation °Sedation is the use of medicines to promote relaxation and relieve discomfort and anxiety. Conscious sedation is a type of sedation. Under conscious sedation you are less alert than normal but are still able to respond to instructions or stimulation. Conscious sedation is used during short medical and dental procedures. It is milder than deep sedation or general anesthesia and allows you to return to your regular activities sooner.  °LET YOUR HEALTH CARE PROVIDER KNOW ABOUT:  °· Any allergies you have. °· All medicines you are taking, including vitamins, herbs, eye drops, creams, and over-the-counter medicines. °· Use of steroids (by mouth or creams). °·   Previous problems you or members of your family have had with the use of anesthetics. °· Any blood disorders you have. °· Previous surgeries you have had. °· Medical conditions you have. °· Possibility of pregnancy, if this applies. °· Use of cigarettes, alcohol, or illegal drugs. °RISKS AND  COMPLICATIONS °Generally, this is a safe procedure. However, as with any procedure, problems can occur. Possible problems include: °· Oversedation. °· Trouble breathing on your own. You may need to have a breathing tube until you are awake and breathing on your own. °· Allergic reaction to any of the medicines used for the procedure. °BEFORE THE PROCEDURE °· You may have blood tests done. These tests can help show how well your kidneys and liver are working. They can also show how well your blood clots. °· A physical exam will be done.   °· Only take medicines as directed by your health care provider. You may need to stop taking medicines (such as blood thinners, aspirin, or nonsteroidal anti-inflammatory drugs) before the procedure.   °· Do not eat or drink at least 6 hours before the procedure or as directed by your health care provider. °· Arrange for a responsible adult, family member, or friend to take you home after the procedure. He or she should stay with you for at least 24 hours after the procedure, until the medicine has worn off. °PROCEDURE  °· An intravenous (IV) catheter will be inserted into one of your veins. Medicine will be able to flow directly into your body through this catheter. You may be given medicine through this tube to help prevent pain and help you relax. °· The medical or dental procedure will be done. °AFTER THE PROCEDURE °· You will stay in a recovery area until the medicine has worn off. Your blood pressure and pulse will be checked.   °·  Depending on the procedure you had, you may be allowed to go home when you can tolerate liquids and your pain is under control. °Document Released: 09/12/2000 Document Revised: 12/23/2012 Document Reviewed: 08/25/2012 °ExitCare® Patient Information ©2015 [employer]. This information is not intended to replace advice given to you by your health care provider. Make sure you discuss any questions you have with your health care provider. °Bone  Marrow Aspiration, Bone Marrow Biopsy °Care After °Read the instructions outlined below and refer to this sheet in the next few weeks. These discharge instructions provide you with general information on caring for yourself after you leave the hospital. Your caregiver may also give you specific instructions. While your treatment has been planned according to the most current medical practices available, unavoidable complications occasionally occur. If you have any problems or questions after discharge, call your caregiver. °FINDING OUT THE RESULTS OF YOUR TEST °Not all test results are available during your visit. If your test results are not back during the visit, make an appointment with your caregiver to find out the results. Do not assume everything is normal if you have not heard from your caregiver or the medical facility. It is important for you to follow up on all of your test results.  °HOME CARE INSTRUCTIONS  °You have had sedation and may be sleepy or dizzy. Your thinking may not be as clear as usual. For the next 24 hours: °· Only take over-the-counter or prescription medicines for pain, discomfort, and or fever as directed by your caregiver. °· Do not drink alcohol. °· Do not smoke. °· Do not drive. °· Do not make important legal decisions. °· Do not   operate heavy machinery. °· Do not care for small children by yourself. °· Keep your dressing clean and dry. You may replace dressing with a bandage after 24 hours. °· You may take a bath or shower after 24 hours. °· Use an ice pack for 20 minutes every 2 hours while awake for pain as needed. °SEEK MEDICAL CARE IF:  °· There is redness, swelling, or increasing pain at the biopsy site. °· There is pus coming from the biopsy site. °· There is drainage from a biopsy site lasting longer than one day. °· An unexplained oral temperature above 102° F (38.9° C) develops. °SEEK IMMEDIATE MEDICAL CARE IF:  °· You develop a rash. °· You have difficulty  breathing. °· You develop any reaction or side effects to medications given. °Document Released: 07/07/2004 Document Revised: 03/12/2011 Document Reviewed: 12/16/2007 °ExitCare® Patient Information ©2015 [employer]. This information is not intended to replace advice given to you by your health care provider. Make sure you discuss any questions you have with your health care provider. ° °

## 2013-09-09 NOTE — H&P (Signed)
Chief Complaint: "I am here today for a bone marrow biopsy."  Referring Physician(s): Shadad,Firas N  History of Present Illness:  Amber [name] is a 53 y.o. female who follows with Dr. Alen Blew for leukocytopenia and presents today for a bone marrow biopsy. She denies any chest pain, shortness of breath or palpitations. She denies any active signs of bleeding or excessive bruising. She denies any recent fever or chills. The patient denies any history of sleep apnea or chronic oxygen use. She has previously tolerated sedation without complications.   Past Medical History  Diagnosis Date  . SAB (spontaneous abortion)   . Elective abortion   . Ectopic pregnancy     RIGHT salpingostomy  . Hypertension   . GERD (gastroesophageal reflux disease)   . Anemia   . Sjogren's disease   . Glaucoma     both eyes  . Rheumatoid arthritis(714.0) 11/07/2011  . History of chicken pox     Past Surgical History  Procedure Laterality Date  . Dilation and curettage of uterus      X 2  . Knee surgery      MENISCUS TEAR REPAIR  . Pelvic laparoscopy  1994    IN 1999 LAPAROSCOPY WITH INCIDENTAL ENTEROTOMY REQUIRING LAPAROTOMY  . Abdominal surgery      bowel repair    Allergies: Review of patient's allergies indicates no known allergies.  Medications: Prior to Admission medications   Medication Sig Start Date End Date Taking? Authorizing Provider  cholecalciferol (VITAMIN D) 400 UNITS TABS Take 400 Units by mouth at bedtime.    Yes Historical Provider, MD  ferrous sulfate 325 (65 FE) MG tablet Take 325 mg by mouth 3 (three) times daily with meals.    Yes Historical Provider, MD  hydroxychloroquine (PLAQUENIL) 200 MG tablet Take 200 mg by mouth 2 (two) times daily.    Yes Historical Provider, MD  ibuprofen (ADVIL,MOTRIN) 800 MG tablet Take 800 mg by mouth every 8 (eight) hours as needed (For pain.).   Yes Historical Provider, MD  lisinopril (PRINIVIL,ZESTRIL) 10 MG tablet Take 10 mg by  mouth every morning.   Yes Historical Provider, MD  metoprolol (LOPRESSOR) 50 MG tablet Take 1 tablet (50 mg total) by mouth 2 (two) times daily. 03/04/13  Yes [name], NP  potassium chloride SA (K-DUR,KLOR-CON) 20 MEQ tablet Take 20 mEq by mouth at bedtime.   Yes Historical Provider, MD  TRAVATAN Z 0.004 % SOLN ophthalmic solution Place 1 drop into both eyes At bedtime. 10/25/11  Yes Historical Provider, MD    Family History  Problem Relation Age of Onset  . Hypertension Mother     died from heart disease  . Heart disease Mother     deceased, unknown cause  . Diabetes Sister   . Hypertension Sister   . Diabetes Sister     History   Social History  . Marital Status: Single    Spouse Name: N/A    Number of Children: 0  . Years of Education: N/A   Occupational History  .     Social History Main Topics  . Smoking status: Never Smoker   . Smokeless tobacco: Never Used  . Alcohol Use: No  . Drug Use: No  . Sexual Activity: No   Other Topics Concern  . None   Social History Narrative   Regular exercise:  No   Caffeine Use: 2 cups coffee daily   No children   "Happily divorced"   Reports that she  is involved at her church   Works with intellectually challenged adults   Born in [place]- she came to [place] as adult.  Age 59.             Review of Systems: A 12 point ROS discussed and pertinent positives are indicated in the HPI above.  All other systems are negative.  Review of Systems  Constitutional: Negative for fever and chills.  Respiratory: Negative for chest tightness, shortness of breath and wheezing.   Cardiovascular: Negative for chest pain.   Vital Signs: BP 137/76  Pulse 85  Temp(Src) 98.5 F (36.9 C) (Oral)  Resp 18  SpO2 99%  LMP 08/24/2013  Physical Exam  Constitutional: She is oriented to person, place, and time. She appears well-developed and well-nourished. No distress.  HENT:  Head: Normocephalic and atraumatic.  Eyes: EOM are  normal.  Neck: Normal range of motion. No tracheal deviation present.  Cardiovascular: Normal rate, regular rhythm and normal heart sounds.  Exam reveals no gallop and no friction rub.   No murmur heard. Pulmonary/Chest: Effort normal and breath sounds normal. She has no wheezes. She has no rales.  Abdominal: Soft. Bowel sounds are normal. She exhibits no distension. There is no tenderness.  Musculoskeletal: Normal range of motion. She exhibits no edema.  Neurological: She is alert and oriented to person, place, and time.  Skin: Skin is warm and dry. She is not diaphoretic.  Psychiatric: She has a normal mood and affect. Her behavior is normal. Thought content normal.    Imaging: No results found.  Labs: Lab Results  Component Value Date   WBC 2.4* 09/09/2013   HCT 36.2 09/09/2013   MCV 83.4 09/09/2013   PLT 306 09/09/2013   NA 138 09/02/2013   K 4.0 09/02/2013   CL 103 11/25/2012   CO2 23 09/02/2013   GLUCOSE 104 09/02/2013   BUN 10.0 09/02/2013   CREATININE 0.8 09/02/2013   CALCIUM 8.9 09/02/2013   PROT 9.1* 09/02/2013   ALBUMIN 3.2* 09/02/2013   AST 21 09/02/2013   ALT 18 09/02/2013   ALKPHOS 51 09/02/2013   BILITOT 0.43 09/02/2013   GFRNONAA >89 10/01/2012   GFRAA >89 10/01/2012   INR 1.01 09/09/2013    Assessment and Plan: Leukocytopenia- Request for image guided bone marrow biopsy today with moderate sedation to rule out myeloproliferative or myelodysplastic disorder. Patient has been NPO, labs reviewed. Risks and Benefits discussed with the patient. All of the patient's questions were answered, patient is agreeable to proceed. Consent signed and in chart.  Tsosie Billing PA-C Interventional Radiology  09/09/13  8:46 AM

## 2013-09-10 ENCOUNTER — Encounter: Payer: Self-pay | Admitting: Medical Oncology

## 2013-09-10 ENCOUNTER — Telehealth: Payer: Self-pay | Admitting: Medical Oncology

## 2013-09-10 NOTE — Telephone Encounter (Signed)
Patient called requesting work excuse note d/t missed days for MD appt and treatment and s/e related to her diagnosiss. States her sister will be picking letter up this afternoon.

## 2013-09-14 ENCOUNTER — Encounter: Payer: Self-pay | Admitting: *Deleted

## 2013-09-14 NOTE — Progress Notes (Signed)
Patient called with revised dates for out of work excuse. New letter put on dr shadad's desk for approval. Will call patient when signed.

## 2013-09-15 ENCOUNTER — Encounter: Payer: Self-pay | Admitting: *Deleted

## 2013-09-15 NOTE — Progress Notes (Signed)
Signed work excuse left at front for patient p/u. Patient notified.

## 2013-09-16 LAB — CHROMOSOME ANALYSIS, BONE MARROW

## 2013-09-18 ENCOUNTER — Ambulatory Visit (HOSPITAL_BASED_OUTPATIENT_CLINIC_OR_DEPARTMENT_OTHER): Payer: BC Managed Care – PPO | Admitting: Oncology

## 2013-09-18 ENCOUNTER — Encounter: Payer: Self-pay | Admitting: Oncology

## 2013-09-18 ENCOUNTER — Telehealth: Payer: Self-pay | Admitting: Oncology

## 2013-09-18 VITALS — BP 108/65 | HR 76 | Temp 99.2°F | Resp 19 | Ht 65.5 in | Wt 201.2 lb

## 2013-09-18 DIAGNOSIS — D72819 Decreased white blood cell count, unspecified: Secondary | ICD-10-CM

## 2013-09-18 DIAGNOSIS — M2550 Pain in unspecified joint: Secondary | ICD-10-CM

## 2013-09-18 DIAGNOSIS — D509 Iron deficiency anemia, unspecified: Secondary | ICD-10-CM

## 2013-09-18 DIAGNOSIS — N920 Excessive and frequent menstruation with regular cycle: Secondary | ICD-10-CM

## 2013-09-18 DIAGNOSIS — N921 Excessive and frequent menstruation with irregular cycle: Secondary | ICD-10-CM

## 2013-09-18 NOTE — Progress Notes (Signed)
Hematology and Oncology Follow Up Visit  Amber [name] [id number] 01-07-1960 53 y.o. 09/18/2013 3:27 PM   Principle Diagnosis: 53 year old woman with iron deficiency anemia as well as fluctuating reactive leukocytopenia was diagnosed in April of 2014.  Prior Therapy: She is status post IV iron infusion in the form of Feraheme given on 05/05/2012 and repeated in 10/2012.   Current therapy: Oral iron supplements on a daily basis.   Interim History: Amber [name] presents today for a followup visit. Since the last visit, he underwent a bone marrow biopsy without any complications. She tolerated it but continues to have slight hip discomfort around the biopsy site. She does report  fatigued and continues to have chronic pain related to her rheumatoid arthritis. She still have diffuse arthralgias and myalgias related to her autoimmune disorder. She has not reported any chest pain or shortness of breath. Has not reported any other bleeding such as GI bleeding or GU bleeding. She continues to work full-time and it tends to activities of daily living. He is not reporting any headaches or blurry vision or double vision. She has not reported any hematochezia or melena. He has not reported any recurrent infections. She does not report any constitutional symptoms of fevers chills or sweats. Has not reported any recent hospitalizations or illnesses. Rest or view of system is unremarkable.  Medications: I have reviewed the patient's current medications.  Current Outpatient Prescriptions  Medication Sig Dispense Refill  . cholecalciferol (VITAMIN D) 400 UNITS TABS Take 400 Units by mouth at bedtime.       . ferrous sulfate 325 (65 FE) MG tablet Take 325 mg by mouth 3 (three) times daily with meals.       . hydroxychloroquine (PLAQUENIL) 200 MG tablet Take 200 mg by mouth 2 (two) times daily.       Marland Kitchen ibuprofen (ADVIL,MOTRIN) 800 MG tablet Take 800 mg by mouth every 8 (eight) hours as needed (For pain.).       Marland Kitchen lisinopril (PRINIVIL,ZESTRIL) 10 MG tablet Take 10 mg by mouth every morning.      . metoprolol (LOPRESSOR) 50 MG tablet Take 1 tablet (50 mg total) by mouth 2 (two) times daily.  60 tablet  5  . potassium chloride SA (K-DUR,KLOR-CON) 20 MEQ tablet Take 20 mEq by mouth at bedtime.      . TRAVATAN Z 0.004 % SOLN ophthalmic solution Place 1 drop into both eyes At bedtime.       No current facility-administered medications for this visit.     Allergies: No Known Allergies    Physical Exam: Blood pressure 108/65, pulse 76, temperature 99.2 F (37.3 C), temperature source Oral, resp. rate 19, height 5' 5.5" (1.664 m), weight 201 lb 3.2 oz (91.264 kg), last menstrual period 08/24/2013, SpO2 100.00%. ECOG: 0 General appearance: alert awake not in any distress. Head: Normocephalic, atraumatic. Neck: no adenopathy or masses. Lymph nodes: Cervical, supraclavicular, and axillary nodes normal. Heart:regular rate and rhythm, S1, S2 normal, no murmur, click, rub or gallop Lung:chest clear, no wheezing, rales, normal symmetric air entry Abdomen: soft, non-tender, without masses or organomegaly EXT:no erythema, or edema.   Lab Results: Lab Results  Component Value Date   WBC 2.4* 09/09/2013   HGB 11.7* 09/09/2013   HCT 36.2 09/09/2013   MCV 83.4 09/09/2013   PLT 306 09/09/2013     Chemistry      Component Value Date/Time   NA 138 09/02/2013 0836   NA 136 11/25/2012 0846   K  4.0 09/02/2013 0836   K 4.3 11/25/2012 0846   CL 103 11/25/2012 0846   CL 108* 06/25/2012 1508   CO2 23 09/02/2013 0836   CO2 24 11/25/2012 0846   BUN 10.0 09/02/2013 0836   BUN 9 11/25/2012 0846   CREATININE 0.8 09/02/2013 0836   CREATININE 0.73 11/25/2012 0846      Component Value Date/Time   CALCIUM 8.9 09/02/2013 0836   CALCIUM 9.5 11/25/2012 0846   ALKPHOS 51 09/02/2013 0836   ALKPHOS 46 11/05/2011 1309   AST 21 09/02/2013 0836   AST 14 11/05/2011 1309   ALT 18 09/02/2013 0836   ALT 15 11/05/2011 1309   BILITOT 0.43 09/02/2013  0836   BILITOT 0.4 11/05/2011 1309       Impression and Plan:  53 year old woman with the following issues:  1. 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 We'll continue to follow her counts closely and supplement as needed.  2. Leukocytopenia: Bone marrow biopsy done on 09/09/2013 did not show any evidence of dysplasia or infiltrative bone marrow process. There is no evidence of any lymphoproliferative disease. Her leukocytopenia likely reactive in nature could be also related to autoimmune disease or Plaquenil. Will continue to monitor this as well and repeat laboratory testing in 3 months.  3. Followup will be in 3 months.   [facility] 9/18/20153:27 PM

## 2013-09-18 NOTE — Telephone Encounter (Signed)
gv and printed appt sched and avs for tp fro DEC

## 2013-09-21 ENCOUNTER — Encounter: Payer: Self-pay | Admitting: Oncology

## 2013-09-21 NOTE — Progress Notes (Signed)
Faxed fmla form to [place] attnLauris Poag @ [phone]

## 2013-09-24 ENCOUNTER — Ambulatory Visit (INDEPENDENT_AMBULATORY_CARE_PROVIDER_SITE_OTHER): Payer: BC Managed Care – PPO | Admitting: Medical

## 2013-09-24 ENCOUNTER — Encounter: Payer: Self-pay | Admitting: Medical

## 2013-09-24 VITALS — BP 119/79 | HR 71 | Temp 98.5°F | Ht 65.75 in | Wt 202.2 lb

## 2013-09-24 DIAGNOSIS — M25559 Pain in unspecified hip: Secondary | ICD-10-CM

## 2013-09-24 DIAGNOSIS — M25552 Pain in left hip: Secondary | ICD-10-CM

## 2013-09-24 MED ORDER — KETOROLAC TROMETHAMINE 60 MG/2ML IM SOLN: 60.0000 mg | Freq: Once | INTRAMUSCULAR | Status: AC

## 2013-09-24 MED ORDER — DICLOFENAC SODIUM 75 MG PO TBEC: 75.0000 mg | DELAYED_RELEASE_TABLET | Freq: Two times a day (BID) | ORAL | Status: DC

## 2013-09-24 MED ORDER — TRAMADOL HCL 50 MG PO TABS: 50.0000 mg | ORAL_TABLET | Freq: Four times a day (QID) | ORAL | Status: DC | PRN

## 2013-09-24 NOTE — Progress Notes (Signed)
   Subjective:    Patient ID: Amber [name], female    DOB: April 30, 1960, 53 y.o.   MRN: [id number]  HPI  Pt in with some left hip pain. Pain for months. Pt saw other providers in practice. Pt has tried meloxicam and did not help much at all. Pt saw rheumatologist has rheumatoid ra. Rheumatologist. Pt not on any medication.  No preceding trauma or fall. Pt was offered cortisone injection by rheumatologist and she declined. Xray was negative.     Review of Systems  Constitutional: Negative for fever, chills and fatigue.  Respiratory: Negative for cough, shortness of breath and wheezing.   Cardiovascular: Negative for chest pain and palpitations.  Musculoskeletal:       Lt hip pain.  Skin: Negative.   Neurological: Negative.   Hematological: Negative for adenopathy. Does not bruise/bleed easily.       Objective:   Physical Exam  General- No acute distress.  Pleasant pt. Lungs- CTA Heart-RRR Lt hip- pain directly over trochanter area.(Very tender). Limited range of motion due to pain.        Assessment & Plan:

## 2013-09-24 NOTE — Patient Instructions (Signed)
I sent diclofenac to your pharmacy. You can start that tomorrow. No other nsaids otc. I am prescribing you tramadol for breakthrough pain. Also I am sending you to orthopedist. We gave you toradol im today.    Trochanteric Bursitis You have hip pain due to trochanteric bursitis. Bursitis means that the sack near the outside of the hip is filled with fluid and inflamed. This sack is made up of protective soft tissue. The pain from trochanteric bursitis can be severe and keep you from sleep. It can radiate to the buttocks or down the outside of the thigh to the knee. The pain is almost always worse when rising from the seated or lying position and with walking. Pain can improve after you take a few steps. It happens more often in people with hip joint and lumbar spine problems, such as arthritis or previous surgery. Very rarely the trochanteric bursa can become infected, and antibiotics and/or surgery may be needed. Treatment often includes an injection of local anesthetic mixed with cortisone medicine. This medicine is injected into the area where it is most tender over the hip. Repeat injections may be necessary if the response to treatment is slow. You can apply ice packs over the tender area for 30 minutes every 2 hours for the next few days. Anti-inflammatory and/or narcotic pain medicine may also be helpful. Limit your activity for the next few days if the pain continues. See your caregiver in 5-10 days if you are not greatly improved.  SEEK IMMEDIATE MEDICAL CARE IF:  You develop severe pain, fever, or increased redness.  You have pain that radiates below the knee. EXERCISES STRETCHING EXERCISES - Trochanteric Bursitis  These exercises may help you when beginning to rehabilitate your injury. Your symptoms may resolve with or without further involvement from your physician, physical therapist, or athletic trainer. While completing these exercises, remember:   Restoring tissue flexibility helps  normal motion to return to the joints. This allows healthier, less painful movement and activity.  An effective stretch should be held for at least 30 seconds.  A stretch should never be painful. You should only feel a gentle lengthening or release in the stretched tissue. STRETCH - Iliotibial Band  On the floor or bed, lie on your side so your injured leg is on top. Bend your knee and grab your ankle.  Slowly bring your knee back so that your thigh is in line with your trunk. Keep your heel at your buttocks and gently arch your back so your head, shoulders and hips line up.  Slowly lower your leg so that your knee approaches the floor/bed until you feel a gentle stretch on the outside of your thigh. If you do not feel a stretch and your knee will not fall farther, place the heel of your opposite foot on top of your knee and pull your thigh down farther.  Hold this stretch for __________ seconds.  Repeat __________ times. Complete this exercise __________ times per day. STRETCH - Hamstrings, Supine   Lie on your back. Loop a belt or towel over the ball of your foot as shown.  Straighten your knee and slowly pull on the belt to raise your injured leg. Do not allow the knee to bend. Keep your opposite leg flat on the floor.  Raise the leg until you feel a gentle stretch behind your knee or thigh. Hold this position for __________ seconds.  Repeat __________ times. Complete this stretch __________ times per day. STRETCH - Quadriceps, Prone  Lie on your stomach on a firm surface, such as a bed or padded floor.  Bend your knee and grasp your ankle. If you are unable to reach your ankle or pant leg, use a belt around your foot to lengthen your reach.  Gently pull your heel toward your buttocks. Your knee should not slide out to the side. You should feel a stretch in the front of your thigh and/or knee.  Hold this position for __________ seconds.  Repeat __________ times. Complete this  stretch __________ times per day. STRETCHING - Hip Flexors, Lunge Half kneel with your knee on the floor and your opposite knee bent and directly over your ankle.  Keep good posture with your head over your shoulders. Tighten your buttocks to point your tailbone downward; this will prevent your back from arching too much.  You should feel a gentle stretch in the front of your thigh and/or hip. If you do not feel any resistance, slightly slide your opposite foot forward and then slowly lunge forward so your knee once again lines up over your ankle. Be sure your tailbone remains pointed downward.  Hold this stretch for __________ seconds.  Repeat __________ times. Complete this stretch __________ times per day. STRETCH - Adductors, Lunge  While standing, spread your legs.  Lean away from your injured leg by bending your opposite knee. You may rest your hands on your thigh for balance.  You should feel a stretch in your inner thigh. Hold for __________ seconds.  Repeat __________ times. Complete this exercise __________ times per day. Document Released: 01/26/2004 Document Revised: 05/04/2013 Document Reviewed: 04/01/2008 [facility] Patient Information 2015 [place], [place]. This information is not intended to replace advice given to you by your health care provider. Make sure you discuss any questions you have with your health care provider.

## 2013-09-24 NOTE — Assessment & Plan Note (Signed)
Lt hip pain. Probable trochanteric bursitis. Pt declined cortisone injection from rheumatologist. Will give toradol 60 mg IM today. Rx of diclofenac can start tomorrow and will rx tramadol for break thru pain. Will refer to orthopedist. She may need PT.

## 2013-10-08 ENCOUNTER — Telehealth: Payer: Self-pay | Admitting: Family

## 2013-10-08 ENCOUNTER — Ambulatory Visit: Payer: BC Managed Care – PPO | Admitting: Medical

## 2013-10-08 DIAGNOSIS — Z0289 Encounter for other administrative examinations: Secondary | ICD-10-CM

## 2013-10-08 NOTE — Telephone Encounter (Signed)
Patient no-showed for today's visit.

## 2013-10-09 ENCOUNTER — Encounter: Payer: Self-pay | Admitting: Family

## 2013-10-09 NOTE — Telephone Encounter (Signed)
Letter sent.

## 2013-10-09 NOTE — Telephone Encounter (Signed)
Please send no show letter

## 2013-10-29 ENCOUNTER — Ambulatory Visit (INDEPENDENT_AMBULATORY_CARE_PROVIDER_SITE_OTHER): Payer: BC Managed Care – PPO | Admitting: Family

## 2013-10-29 ENCOUNTER — Encounter: Payer: Self-pay | Admitting: Family

## 2013-10-29 VITALS — BP 145/80 | HR 93 | Temp 98.2°F | Wt 201.0 lb

## 2013-10-29 DIAGNOSIS — J0100 Acute maxillary sinusitis, unspecified: Secondary | ICD-10-CM

## 2013-10-29 DIAGNOSIS — J209 Acute bronchitis, unspecified: Secondary | ICD-10-CM | POA: Insufficient documentation

## 2013-10-29 MED ORDER — AMOXICILLIN-POT CLAVULANATE 875-125 MG PO TABS: 1.0000 | ORAL_TABLET | Freq: Two times a day (BID) | ORAL | Status: DC

## 2013-10-29 NOTE — Assessment & Plan Note (Signed)
Continue pred taper and albuterol, add augmentin.

## 2013-10-29 NOTE — Progress Notes (Signed)
Pre visit review using our clinic review tool, if applicable. No additional management support is needed unless otherwise documented below in the visit note. 

## 2013-10-29 NOTE — Patient Instructions (Signed)
Call if symptoms worsen or if not improved in 2-3 days.

## 2013-10-29 NOTE — Progress Notes (Signed)
Subjective:    Patient ID: Amber [name], female    DOB: [birth year], 53 y.o.   MRN: [id number]  HPI  Amber [name] presents today with multiple complaints. She reports that she developed symptoms 6 days ago.   Symptoms started with nasal congestion. Yesterday she developed some chest tightness. She reports that she saw NP at [facility] who performed CXR which per pt noted bronchitis. She placed pt on proair and prednisone taper which she started yesterday.   She does not yet feel any better today. She reports + cough, + headache, + Nasal congestion productive of yellow mucous.  She reports cough productive of yellow sputum. She denies associated fever.  Reports poor energy level. She has not had her flu shot yet.   Review of Systems    see HPI  Past Medical History  Diagnosis Date  . SAB (spontaneous abortion)   . Elective abortion   . Ectopic pregnancy     RIGHT salpingostomy  . Hypertension   . GERD (gastroesophageal reflux disease)   . Anemia   . Sjogren's disease   . Glaucoma     both eyes  . Rheumatoid arthritis(714.0) 11/07/2011  . History of chicken pox     History   Social History  . Marital Status: Single    Spouse Name: N/A    Number of Children: 0  . Years of Education: N/A   Occupational History  .     Social History Main Topics  . Smoking status: Never Smoker   . Smokeless tobacco: Never Used  . Alcohol Use: No  . Drug Use: No  . Sexual Activity: No   Other Topics Concern  . Not on file   Social History Narrative   Regular exercise:  No   Caffeine Use: 2 cups coffee daily   No children   "Happily divorced"   Reports that she is involved at her church   Works with intellectually challenged adults   Born [place]- she came to [place] as adult.  Age 83.              Past Surgical History  Procedure Laterality Date  . Dilation and curettage of uterus      X 2  . Knee surgery      MENISCUS TEAR REPAIR  . Pelvic laparoscopy  1994    IN 1999  LAPAROSCOPY WITH INCIDENTAL ENTEROTOMY REQUIRING LAPAROTOMY  . Abdominal surgery      bowel repair    Family History  Problem Relation Age of Onset  . Hypertension Mother     died from heart disease  . Heart disease Mother     deceased, unknown cause  . Diabetes Sister   . Hypertension Sister   . Diabetes Sister     No Known Allergies  Current Outpatient Prescriptions on File Prior to Visit  Medication Sig Dispense Refill  . cholecalciferol (VITAMIN D) 400 UNITS TABS Take 400 Units by mouth at bedtime.       . diclofenac (VOLTAREN) 75 MG EC tablet Take 1 tablet (75 mg total) by mouth 2 (two) times daily.  30 tablet  0  . ferrous sulfate 325 (65 FE) MG tablet Take 325 mg by mouth 3 (three) times daily with meals.       . hydroxychloroquine (PLAQUENIL) 200 MG tablet Take 200 mg by mouth 2 (two) times daily.       Marland Kitchen ibuprofen (ADVIL,MOTRIN) 800 MG tablet Take 800 mg by mouth every  8 (eight) hours as needed (For pain.).      Marland Kitchen lisinopril (PRINIVIL,ZESTRIL) 10 MG tablet Take 10 mg by mouth every morning.      . metoprolol (LOPRESSOR) 50 MG tablet Take 1 tablet (50 mg total) by mouth 2 (two) times daily.  60 tablet  5  . potassium chloride SA (K-DUR,KLOR-CON) 20 MEQ tablet Take 20 mEq by mouth at bedtime.      . traMADol (ULTRAM) 50 MG tablet Take 1 tablet (50 mg total) by mouth every 6 (six) hours as needed.  24 tablet  0  . TRAVATAN Z 0.004 % SOLN ophthalmic solution Place 1 drop into both eyes At bedtime.       No current facility-administered medications on file prior to visit.    BP 145/80    Objective:   Physical Exam  Constitutional: She is oriented to person, place, and time. She appears well-developed and well-nourished. No distress.  HENT:  Head: Normocephalic and atraumatic.  Right Ear: Tympanic membrane and ear canal normal.  Left Ear: Tympanic membrane normal.  Mouth/Throat: No oropharyngeal exudate, posterior oropharyngeal edema or posterior oropharyngeal  erythema.  Bilateral maxillary sinus tenderness to palpation  Cardiovascular: Normal rate and regular rhythm.   No murmur heard. Pulmonary/Chest: Effort normal and breath sounds normal. No respiratory distress. She has no wheezes. She has no rales. She exhibits no tenderness.  Musculoskeletal: She exhibits no edema.  Neurological: She is alert and oriented to person, place, and time.  Psychiatric: She has a normal mood and affect. Her behavior is normal. Judgment and thought content normal.          Assessment & Plan:  Advised pt to get flu shot as soon as she is feeling better.

## 2013-10-29 NOTE — Assessment & Plan Note (Signed)
Will rx with augmentin.

## 2013-11-02 ENCOUNTER — Encounter: Payer: Self-pay | Admitting: Family

## 2013-11-13 ENCOUNTER — Ambulatory Visit: Payer: BC Managed Care – PPO | Admitting: Oncology

## 2013-11-13 ENCOUNTER — Other Ambulatory Visit: Payer: BC Managed Care – PPO

## 2013-12-09 ENCOUNTER — Ambulatory Visit (INDEPENDENT_AMBULATORY_CARE_PROVIDER_SITE_OTHER): Payer: BC Managed Care – PPO | Admitting: Medical

## 2013-12-09 ENCOUNTER — Encounter: Payer: Self-pay | Admitting: Medical

## 2013-12-09 VITALS — BP 148/97 | HR 79 | Temp 98.4°F | Ht 65.75 in | Wt 202.6 lb

## 2013-12-09 DIAGNOSIS — J209 Acute bronchitis, unspecified: Secondary | ICD-10-CM

## 2013-12-09 DIAGNOSIS — J0100 Acute maxillary sinusitis, unspecified: Secondary | ICD-10-CM | POA: Insufficient documentation

## 2013-12-09 MED ORDER — CEFDINIR 300 MG PO CAPS: 300.0000 mg | ORAL_CAPSULE | Freq: Two times a day (BID) | ORAL | Status: DC

## 2013-12-09 MED ORDER — FLUTICASONE PROPIONATE 50 MCG/ACT NA SUSP: 2.0000 | Freq: Every day | NASAL | Status: DC

## 2013-12-09 MED ORDER — BENZONATATE 200 MG PO CAPS: 200.0000 mg | ORAL_CAPSULE | Freq: Three times a day (TID) | ORAL | Status: DC | PRN

## 2013-12-09 NOTE — Progress Notes (Signed)
Pre visit review using our clinic review tool, if applicable. No additional management support is needed unless otherwise documented below in the visit note. 

## 2013-12-09 NOTE — Patient Instructions (Addendum)
You appear to have bronchitis and sinusitis(Also lt om). Rest hydrate and tylenol for fever. I am prescribing benzonatate  cough medicine,and  Cefdinir antibiotic. For your nasal congestion flonase rx.  You should gradually get better. If not then notify us and would recommend a chest xray.  Follow up in 7-10 days or as needed  Addendum-Pt only needs a work note for date of service so employer knows she came in.

## 2013-12-09 NOTE — Progress Notes (Signed)
Subjective:    Patient ID: Amber [name], female    DOB: July 08, 1960, 53 y.o.   MRN: [id number]  HPI   Pt in today reporting productive cough, nasal congestion and runny nose for 5 Days. Pt has chest congestion, sinus pressure and bilateral rib pain when when coughs.  Associated symptoms( below yes or no)  Fever-yes. Started yesterday Chills-yes. Started yesterday.Some sweats at night. Chest congestion-yes Sneezing- yes Itching eyes-yes Sore throat- mild  Post-nasal drainage-yes Wheezing-no. But occasional rattle sound to chest. Purulent nasal drainage-yes. Fatigue-yes Both ears hurt Lt>than rt side.  Past Medical History  Diagnosis Date  . SAB (spontaneous abortion)   . Elective abortion   . Ectopic pregnancy     RIGHT salpingostomy  . Hypertension   . GERD (gastroesophageal reflux disease)   . Anemia   . Sjogren's disease   . Glaucoma     both eyes  . Rheumatoid arthritis(714.0) 11/07/2011  . History of chicken pox     History   Social History  . Marital Status: Single    Spouse Name: N/A    Number of Children: 0  . Years of Education: N/A   Occupational History  .     Social History Main Topics  . Smoking status: Never Smoker   . Smokeless tobacco: Never Used  . Alcohol Use: No  . Drug Use: No  . Sexual Activity: No   Other Topics Concern  . Not on file   Social History Narrative   Regular exercise:  No   Caffeine Use: 2 cups coffee daily   No children   "Happily divorced"   Reports that she is involved at her church   Works with intellectually challenged adults   Born [place]- she came to [place] as adult.  Age 17.              Past Surgical History  Procedure Laterality Date  . Dilation and curettage of uterus      X 2  . Knee surgery      MENISCUS TEAR REPAIR  . Pelvic laparoscopy  1994    IN 1999 LAPAROSCOPY WITH INCIDENTAL ENTEROTOMY REQUIRING LAPAROTOMY  . Abdominal surgery      bowel repair    Family History  Problem  Relation Age of Onset  . Hypertension Mother     died from heart disease  . Heart disease Mother     deceased, unknown cause  . Diabetes Sister   . Hypertension Sister   . Diabetes Sister     No Known Allergies  Current Outpatient Prescriptions on File Prior to Visit  Medication Sig Dispense Refill  . cholecalciferol (VITAMIN D) 400 UNITS TABS Take 400 Units by mouth at bedtime.     . ferrous sulfate 325 (65 FE) MG tablet Take 325 mg by mouth 3 (three) times daily with meals.     . hydroxychloroquine (PLAQUENIL) 200 MG tablet Take 200 mg by mouth 2 (two) times daily.     Marland Kitchen ibuprofen (ADVIL,MOTRIN) 800 MG tablet Take 800 mg by mouth every 8 (eight) hours as needed (For pain.).    Marland Kitchen lisinopril (PRINIVIL,ZESTRIL) 10 MG tablet Take 10 mg by mouth every morning.    . metoprolol (LOPRESSOR) 50 MG tablet Take 1 tablet (50 mg total) by mouth 2 (two) times daily. 60 tablet 5  . potassium chloride SA (K-DUR,KLOR-CON) 20 MEQ tablet Take 20 mEq by mouth at bedtime.    . traMADol (ULTRAM) 50 MG tablet  Take 1 tablet (50 mg total) by mouth every 6 (six) hours as needed. 24 tablet 0  . TRAVATAN Z 0.004 % SOLN ophthalmic solution Place 1 drop into both eyes At bedtime.     No current facility-administered medications on file prior to visit.    BP 148/97 mmHg  Pulse 79  Temp(Src) 98.4 F (36.9 C) (Oral)  Ht 5' 5.75" (1.67 m)  Wt 202 lb 9.6 oz (91.899 kg)  BMI 32.95 kg/m2  SpO2 96%  LMP 11/20/2013       Review of Systems  Constitutional: Positive for fever, chills and fatigue.  HENT: Positive for congestion, ear pain, postnasal drip, rhinorrhea, sinus pressure, sneezing and sore throat.   Eyes: Positive for itching.  Respiratory: Positive for cough. Negative for wheezing.        Chest congestion and at time has rattle.  Cardiovascular: Negative for chest pain and palpitations.  Musculoskeletal: Negative for back pain and neck pain.       Bilateral rib pain when she coughs.    Neurological: Negative for dizziness and headaches.  Hematological: Negative for adenopathy. Does not bruise/bleed easily.       Objective:   Physical Exam   General  Mental Status - Alert. General Appearance - Well groomed. Not in acute distress.  Skin Rashes- No Rashes.  HEENT Head- Normal. Ear Auditory Canal - Left- Normal. Right - Normal.Tympanic Membrane- Left- Moderat bright red. Right- mild dull red. Eye Sclera/Conjunctiva- Left- Normal. Right- Normal. Nose & Sinuses Nasal Mucosa- Left-  Boggy and  Congested. Right-   Boggy and Congested. Maxillary sinus pressure/pain. No frontal sinus pressure. Mouth & Throat Lips: Upper Lip- Normal: no dryness, cracking, pallor, cyanosis, or vesicular eruption. Lower Lip-Normal: no dryness, cracking, pallor, cyanosis or vesicular eruption. Buccal Mucosa- Bilateral- No Aphthous ulcers. Oropharynx- No Discharge or Erythema. Tonsils: Characteristics- Bilateral- No Erythema or Congestion. Size/Enlargement- Bilateral- No enlargement. Discharge- bilateral-None.  Neck Neck- Supple. No Masses.   Chest and Lung Exam Auscultation: Breath Sounds:- even and unlabored, but bilateral upper lobe rhonchi.  Cardiovascular Auscultation:Rythm- Regular, rate and rhythm. Murmurs & Other Heart Sounds:Ausculatation of the heart reveal- No Murmurs.  Lymphatic Head & Neck General Head & Neck Lymphatics: Bilateral: Description- No Localized lymphadenopathy.         Assessment & Plan:

## 2013-12-09 NOTE — Assessment & Plan Note (Signed)
You appear to have bronchitis and sinusitis(Also lt om). Rest hydrate and tylenol for fever. I am prescribing benzonatate  cough medicine,and  Cefdinir antibiotic. For your nasal congestion flonase rx.  You should gradually get better. If not then notify us and would recommend a chest xray.  Follow up in 7-10 days or as needed

## 2013-12-14 ENCOUNTER — Telehealth: Payer: Self-pay | Admitting: Family

## 2013-12-14 NOTE — Telephone Encounter (Signed)
Pt states that she needs a note stating/explaining when she was seen by me. I saw pt on 12-09-2013. I reviewed this note at 9 pm on 12-14-2013.  I will go ahead and send note to karen lpn about writing the letter.

## 2013-12-14 NOTE — Telephone Encounter (Signed)
Caller name: Nasiah Relation to pt: self Call back number: [phone] Pharmacy:  Reason for call:   Patient states that she needs a work note for when she saw Percell Miller last week. Patient states that she today was her first day back at work.

## 2013-12-14 NOTE — Telephone Encounter (Signed)
Patient called back regarding this. She states that she will not get paid until she receives this letter and her manager cannot process payroll after 2.30

## 2013-12-16 ENCOUNTER — Other Ambulatory Visit: Payer: Self-pay | Admitting: Gynecology

## 2013-12-16 NOTE — Telephone Encounter (Signed)
Please check who prescribed the HCTZ I do not see it on the medication list she is already on the antihypertensive medication

## 2013-12-18 ENCOUNTER — Ambulatory Visit (HOSPITAL_BASED_OUTPATIENT_CLINIC_OR_DEPARTMENT_OTHER): Payer: BC Managed Care – PPO | Admitting: Lab

## 2013-12-18 ENCOUNTER — Ambulatory Visit (HOSPITAL_BASED_OUTPATIENT_CLINIC_OR_DEPARTMENT_OTHER): Payer: BC Managed Care – PPO | Admitting: Oncology

## 2013-12-18 ENCOUNTER — Telehealth: Payer: Self-pay | Admitting: Oncology

## 2013-12-18 VITALS — BP 100/63 | HR 73 | Temp 98.4°F | Resp 18 | Ht 65.75 in | Wt 198.8 lb

## 2013-12-18 DIAGNOSIS — D72819 Decreased white blood cell count, unspecified: Secondary | ICD-10-CM

## 2013-12-18 DIAGNOSIS — N924 Excessive bleeding in the premenopausal period: Secondary | ICD-10-CM

## 2013-12-18 DIAGNOSIS — D500 Iron deficiency anemia secondary to blood loss (chronic): Secondary | ICD-10-CM

## 2013-12-18 DIAGNOSIS — N921 Excessive and frequent menstruation with irregular cycle: Secondary | ICD-10-CM

## 2013-12-18 LAB — COMPREHENSIVE METABOLIC PANEL (CC13)
ALT: 27 U/L (ref 0–55)
AST: 19 U/L (ref 5–34)
Albumin: 3.5 g/dL (ref 3.5–5.0)
Alkaline Phosphatase: 64 U/L (ref 40–150)
Anion Gap: 10 mEq/L (ref 3–11)
BUN: 12.5 mg/dL (ref 7.0–26.0)
CALCIUM: 9.7 mg/dL (ref 8.4–10.4)
CO2: 25 meq/L (ref 22–29)
Chloride: 105 mEq/L (ref 98–109)
Creatinine: 0.9 mg/dL (ref 0.6–1.1)
EGFR: 83 mL/min/{1.73_m2} — ABNORMAL LOW (ref >90)
Glucose: 74 mg/dl (ref 70–140)
Potassium: 3.9 mEq/L (ref 3.5–5.1)
Sodium: 140 mEq/L (ref 136–145)
Total Bilirubin: 0.41 mg/dL (ref 0.20–1.20)
Total Protein: 9.3 g/dL — ABNORMAL HIGH (ref 6.4–8.3)

## 2013-12-18 LAB — CBC WITH DIFFERENTIAL/PLATELET
BASO%: 0.3 % (ref 0.0–2.0)
Basophils Absolute: 0 10*3/uL (ref 0.0–0.1)
EOS%: 1.5 % (ref 0.0–7.0)
Eosinophils Absolute: 0.1 10*3/uL (ref 0.0–0.5)
HCT: 40.6 % (ref 34.8–46.6)
HGB: 13.3 g/dL (ref 11.6–15.9)
LYMPH%: 48 % (ref 14.0–49.7)
MCH: 27.9 pg (ref 25.1–34.0)
MCHC: 32.8 g/dL (ref 31.5–36.0)
MCV: 85.1 fL (ref 79.5–101.0)
MONO#: 0.4 10*3/uL (ref 0.1–0.9)
MONO%: 9.5 % (ref 0.0–14.0)
NEUT#: 1.6 10*3/uL (ref 1.5–6.5)
NEUT%: 40.7 % (ref 38.4–76.8)
Platelets: 256 10*3/uL (ref 145–400)
RBC: 4.77 10*6/uL (ref 3.70–5.45)
RDW: 14.4 % (ref 11.2–14.5)
WBC: 4 10*3/uL (ref 3.9–10.3)
lymph#: 1.9 10*3/uL (ref 0.9–3.3)
nRBC: 0 % (ref 0–0)

## 2013-12-18 NOTE — Telephone Encounter (Signed)
m °

## 2013-12-18 NOTE — Progress Notes (Signed)
Hematology and Oncology Follow Up Visit  Amber [name] [id number] 05-18-1960 53 y.o. 12/18/2013 3:51 PM   Principle Diagnosis: 53 year old woman with iron deficiency anemia as well as fluctuating reactive leukocytopenia was diagnosed in April of 2014.  Prior Therapy: She is status post IV iron infusion in the form of Feraheme given on 05/05/2012, 10/2012 and 09/2013.    Current therapy: Oral iron supplements on a daily basis.   Interim History: Amber [name] presents today for a followup visit. Since the last visit, he underwent IV iron infusion without any complications. She reported significant improvement in her symptoms including her energy and fatigue level. She continues to work full-time without any decline. She still have diffuse arthralgias and myalgias related to her autoimmune disorder. She has not reported any chest pain or shortness of breath. Has not reported any other bleeding such as GI bleeding or GU bleeding. She continues to work full-time and it tends to activities of daily living. He is not reporting any headaches or blurry vision or double vision. She has not reported any hematochezia or melena. He has not reported any recurrent infections. She does not report any constitutional symptoms of fevers chills or sweats. Has not reported any recent hospitalizations or illnesses. Rest or view of system is unremarkable.  Medications: I have reviewed the patient's current medications.  Current Outpatient Prescriptions  Medication Sig Dispense Refill  . cholecalciferol (VITAMIN D) 400 UNITS TABS Take 400 Units by mouth at bedtime.     . ferrous sulfate 325 (65 FE) MG tablet Take 325 mg by mouth 3 (three) times daily with meals.     . hydroxychloroquine (PLAQUENIL) 200 MG tablet Take 200 mg by mouth 2 (two) times daily.     Marland Kitchen ibuprofen (ADVIL,MOTRIN) 800 MG tablet Take 800 mg by mouth every 8 (eight) hours as needed (For pain.).    Marland Kitchen lisinopril (PRINIVIL,ZESTRIL) 10 MG tablet Take  10 mg by mouth every morning.    . metoprolol (LOPRESSOR) 50 MG tablet Take 1 tablet (50 mg total) by mouth 2 (two) times daily. 60 tablet 5  . potassium chloride SA (K-DUR,KLOR-CON) 20 MEQ tablet Take 20 mEq by mouth at bedtime.    . traMADol (ULTRAM) 50 MG tablet Take 1 tablet (50 mg total) by mouth every 6 (six) hours as needed. 24 tablet 0  . TRAVATAN Z 0.004 % SOLN ophthalmic solution Place 1 drop into both eyes At bedtime.     No current facility-administered medications for this visit.     Allergies: No Known Allergies    Physical Exam: Blood pressure 100/63, pulse 73, temperature 98.4 F (36.9 C), temperature source Oral, resp. rate 18, height 5' 5.75" (1.67 m), weight 198 lb 12.8 oz (90.175 kg), last menstrual period 11/20/2013, SpO2 100 %. ECOG: 0 General appearance: alert awake well-appearing woman. Head: Normocephalic.  Neck: no adenopathy or masses. Lymph nodes: Cervical, supraclavicular, and axillary nodes normal. Heart:regular rate and rhythm, S1, S2 normal, no murmur, click, rub or gallop Lung:chest clear, no wheezing, rales, normal symmetric air entry Abdomen: soft, non-tender, without masses or organomegaly EXT:no erythema, or edema.   Lab Results: Lab Results  Component Value Date   WBC 4.0 12/18/2013   HGB 13.3 12/18/2013   HCT 40.6 12/18/2013   MCV 85.1 12/18/2013   PLT 256 12/18/2013     Chemistry      Component Value Date/Time   NA 138 09/02/2013 0836   NA 136 11/25/2012 0846   K 4.0 09/02/2013 0836  K 4.3 11/25/2012 0846   CL 103 11/25/2012 0846   CL 108* 06/25/2012 1508   CO2 23 09/02/2013 0836   CO2 24 11/25/2012 0846   BUN 10.0 09/02/2013 0836   BUN 9 11/25/2012 0846   CREATININE 0.8 09/02/2013 0836   CREATININE 0.73 11/25/2012 0846      Component Value Date/Time   CALCIUM 8.9 09/02/2013 0836   CALCIUM 9.5 11/25/2012 0846   ALKPHOS 51 09/02/2013 0836   ALKPHOS 46 11/05/2011 1309   AST 21 09/02/2013 0836   AST 14 11/05/2011 1309    ALT 18 09/02/2013 0836   ALT 15 11/05/2011 1309   BILITOT 0.43 09/02/2013 0836   BILITOT 0.4 11/05/2011 1309       Impression and Plan:  53 year old woman with the following issues:  1. 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 Her hemoglobin is perfectly normal at this time and she is completely asymptomatic. She continues to have heavy menstrual bleeding and she is at risk of developing recurrent iron deficiency. We need to continue to follow her iron stores and replaced them as needed.  2. Leukocytopenia: Bone marrow biopsy done on 09/09/2013 did not show any evidence of dysplasia or infiltrative bone marrow process. There is no evidence of any lymphoproliferative disease. Her leukocytopenia likely reactive in nature could be also related to autoimmune disease or related to iron deficiency. Her white cell count is perfectly normal at this time and we will continue to monitor this.  3. Followup will be in 5 months.   FUWTKT,CCEQF 12/18/20153:51 PM

## 2013-12-21 LAB — IRON AND TIBC CHCC
%SAT: 14 % — ABNORMAL LOW (ref 21–57)
Iron: 43 ug/dL (ref 41–142)
TIBC: 314 ug/dL (ref 236–444)
UIBC: 271 ug/dL (ref 120–384)

## 2013-12-21 LAB — FERRITIN CHCC: Ferritin: 78 ng/ml (ref 9–269)

## 2013-12-31 ENCOUNTER — Encounter: Payer: BC Managed Care – PPO | Admitting: Gynecology

## 2014-01-15 ENCOUNTER — Encounter: Payer: Self-pay | Admitting: Gynecology

## 2014-01-15 ENCOUNTER — Ambulatory Visit (INDEPENDENT_AMBULATORY_CARE_PROVIDER_SITE_OTHER): Payer: BLUE CROSS/BLUE SHIELD | Admitting: Gynecology

## 2014-01-15 ENCOUNTER — Other Ambulatory Visit (HOSPITAL_COMMUNITY)
Admission: RE | Admit: 2014-01-15 | Discharge: 2014-01-15 | Disposition: A | Discharge status: Home / Self Care | Payer: BLUE CROSS/BLUE SHIELD | Source: Ambulatory Visit | Attending: Gynecology | Admitting: Gynecology

## 2014-01-15 VITALS — BP 144/88 | Ht 60.5 in | Wt 204.0 lb

## 2014-01-15 DIAGNOSIS — Z1151 Encounter for screening for human papillomavirus (HPV): Secondary | ICD-10-CM | POA: Diagnosis present

## 2014-01-15 DIAGNOSIS — D251 Intramural leiomyoma of uterus: Secondary | ICD-10-CM

## 2014-01-15 DIAGNOSIS — Z01419 Encounter for gynecological examination (general) (routine) without abnormal findings: Secondary | ICD-10-CM | POA: Insufficient documentation

## 2014-01-15 DIAGNOSIS — Z862 Personal history of diseases of the blood and blood-forming organs and certain disorders involving the immune mechanism: Secondary | ICD-10-CM

## 2014-01-15 DIAGNOSIS — Z23 Encounter for immunization: Secondary | ICD-10-CM

## 2014-01-15 MED ORDER — SILVER SULFADIAZINE 1 % EX CREA: 1.0000 "application " | TOPICAL_CREAM | Freq: Two times a day (BID) | CUTANEOUS | Status: DC

## 2014-01-15 NOTE — Addendum Note (Signed)
Addended by: Thurnell Garbe A on: 01/15/2014 11:13 AM   Modules accepted: Orders, SmartSet

## 2014-01-15 NOTE — Patient Instructions (Signed)
Abdominal Hysterectomy Abdominal hysterectomy is a surgical procedure to remove your womb (uterus). Your uterus is the muscular organ that contains a developing baby. This surgery is done for many reasons. You may need an abdominal hysterectomy if you have cancer, growths (tumors), long-term pain, or bleeding. You may also have this procedure if your uterus has slipped down into your vagina (uterine prolapse). Depending on why you need an abdominal hysterectomy, you may also have other reproductive organs removed. These could include the part of your vagina that connects with your uterus (cervix), the organs that make eggs (ovaries), and the tubes that connect the ovaries to the uterus (fallopian tubes). LET [facility] CARE PROVIDER KNOW ABOUT:   Any allergies you have.  All medicines you are taking, including vitamins, herbs, eye drops, creams, and over-the-counter medicines.  Previous problems you or members of your family have had with the use of anesthetics.  Any blood disorders you have.  Previous surgeries you have had.  Medical conditions you have. RISKS AND COMPLICATIONS Generally, this is a safe procedure. However, as with any procedure, problems can occur. Infection is the most common problem after an abdominal hysterectomy. Other possible problems include:  Bleeding.  Formation of blood clots that may break free and travel to your lungs.  Injury to other organs near your uterus.  Nerve injury causing nerve pain.  Decreased interest in sex or pain during sexual intercourse. BEFORE THE PROCEDURE  Abdominal hysterectomy is a major surgical procedure. It can affect the way you feel about yourself. Talk to your health care provider about the physical and emotional changes hysterectomy may cause.  You may need to have blood work and X-rays done before surgery.  Quit smoking if you smoke. Ask your health care provider for help if you are struggling to quit.  Stop taking  medicines that thin your blood as directed by your health care provider.  You may be instructed to take antibiotic medicines or laxatives before surgery.  Do not eat or drink anything for 6-8 hours before surgery.  Take your regular medicines with a small sip of water.  Bathe or shower the night or morning before surgery. PROCEDURE  Abdominal hysterectomy is done in the operating room at the hospital.  In most cases, you will be given a medicine that makes you go to sleep (general anesthetic).  The surgeon will make a cut (incision) through the skin in your lower belly.  The incision may be about 5-7 inches long. It may go side-to-side or up-and-down.  The surgeon will move aside the body tissue that covers your uterus. The surgeon will then carefully take out your uterus along with any of your other reproductive organs that need to be removed.  Bleeding will be controlled with clamps or sutures.  The surgeon will close your incision with sutures or metal clips. AFTER THE PROCEDURE  You will have some pain immediately after the procedure.  You will be given pain medicine in the recovery room.  You will be taken to your hospital room when you have recovered from the anesthesia.  You may need to stay in the hospital for 2-5 days.  You will be given instructions for recovery at home. Document Released: 12/23/2012 Document Reviewed: 12/23/2012 [facility] Patient Information 2015 [place], [place]. This information is not intended to replace advice given to you by your health care provider. Make sure you discuss any questions you have with your health care provider.

## 2014-01-15 NOTE — Progress Notes (Signed)
Amber [name] May 29, 1960 [id number]   History:    54 y.o.  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 Patient twice in 2014 and 1 since September 2015 patient received IV iron infusion. Her last hemoglobin was 13.3 with hematocrit 40.6 platelet count 256,000 white blood count 4.0 this or shortly after the IV iron infusion at the end of 2015. She is currently taking iron supplementation twice a day. She is scheduled to follow-up with the medical oncologist.   Patient has history of fibroid uterus and has been adamant on hysterectomy but is ready to proceed later this year. A Mirena IUD had been attempted in the past but it was partially expulsed or was found in the lower uterine segment. Review of her records indicates the following:  1 spontaneous AB  1 elective AB  1 right ectopic resulted in salpingostomy (1994)  Laparoscopic attempt of removing ovarian cyst by another physician in [place] resulted in an incidental enterotomy regarding laparotomy  Patient's last ultrasound chamber 2015: Uterus measured 4.8 x 9.0 x 6.9 cm with endometrial stripe is 7.5 mm. Which has increased since last ultrasound scan as well as the sizes of these fibroids. Previously seen cystic mass has completely resolved. Both right and left ovary were normal.  She reports heavy menstrual cycles although regular. She had tried Lysteda in the past but had side effects.  Patient has history of colonoscopy in 2013 whereby 2 benign polyps were removed. Patient with no past history of any abnormal Pap smears. Patient not sexually active.   Past medical history,surgical history, family history and social history were all reviewed and documented in the EPIC chart.  Gynecologic History Patient's last menstrual period  was 12/18/2013. Contraception: none Last Pa2012Results were: normal Last mammogr2015Results were: normal  Obstetric History OB History  Gravida Para Term Preterm AB SAB TAB Ectopic Multiple Living  3 0   2   1  0    # Outcome Date GA Lbr Len/2nd Weight Sex Delivery Anes PTL Lv  3 Ectopic           2 AB           1 Gravida                ROS: A ROS was performed and pertinent positives and negatives are included in the history.  GENERAL: No fevers or chills. HEENT: No change in vision, no earache, sore throat or sinus congestion. NECK: No pain or stiffness. CARDIOVASCULAR: No chest pain or pressure. No palpitations. PULMONARY: No shortness of breath, cough or wheeze. GASTROINTESTINAL: No abdominal pain, nausea, vomiting or diarrhea, melena or bright red blood per rectum. GENITOURINARY: No urinary frequency, urgency, hesitancy or dysuria. MUSCULOSKELETAL: No joint or muscle pain, no back pain, no recent trauma. DERMATOLOGIC: No rash, no itching, no lesions. ENDOCRINE: No polyuria, polydipsia, no heat or cold intolerance. No recent change in weight. HEMATOLOGICAL: No anemia or easy bruising or bleeding. NEUROLOGIC: No headache, seizures, numbness, tingling or weakness. PSYCHIATRIC: No depression, no loss of interest in normal activity or change in sleep pattern.     Exam: chaperone present  BP 144/88 mmHg  Ht 5' 0.5" (1.537 m)  Wt 204 lb (92.534 kg)  BMI 39.17 kg/m2  LMP 12/18/2013  Body mass index is 39.17 kg/(m^2).  General appearance : Well developed well nourished female. No acute distress HEENT: Neck supple, trachea midline, no carotid bruits, no thyroidmegaly Lungs: Clear to auscultation, no rhonchi or wheezes, or rib retractions  Heart: Regular rate and rhythm, no murmurs or gallops Breast:Examined in sitting and supine position were symmetrical in appearance, no palpable masses or tenderness,  no skin retraction, no nipple inversion, no nipple discharge, no skin  discoloration, no axillary or supraclavicular lymphadenopathy Abdomen: no palpable masses or tenderness, no rebound or guarding Extremities: no edema or skin discoloration or tenderness  Pelvic:  Bartholin, Urethra, Skene Glands: Within normal limits             Vagina: No gross lesions or discharge  Cervix: No gross lesions or discharge  Uterus Anteverted 12 week size, shape and consistency, non-tender and mobile  Adnexa  Without masses or tenderness  Anus and perineum  normal   Rectovaginal  normal sphincter tone without palpated masses or tenderness             HemoccuPCP provides  Assessment/Plan:  54 y.o. female for annual with history of leiomyomatous uteri and menorrhagia resulting in severe anemia and and prior IV iron infusion. Patient to follow-up with the hematologist in May. She is going to call us in the summer to schedule her abdominal hysterectomy. Meanwhile she will continue on her iron supplementation twice a day. Her PCP has been doing her blood work. Patient to receive the flu vaccine today. Her Pap smear was done today. She was reminded to schedule her mammogram next month and to do her monthly breast exams. Patient will be prescribed Silvadene cream to apply to her anterior abdominal wall where she had sustained a superficial burn while cooking.   Janetta Hora MD, 10:14 AM 01/15/2014

## 2014-01-19 LAB — CYTOLOGY - PAP

## 2014-02-11 ENCOUNTER — Other Ambulatory Visit: Payer: Self-pay | Admitting: Gynecology

## 2014-02-17 ENCOUNTER — Encounter: Payer: Self-pay | Admitting: Family

## 2014-02-17 ENCOUNTER — Ambulatory Visit (INDEPENDENT_AMBULATORY_CARE_PROVIDER_SITE_OTHER): Payer: BLUE CROSS/BLUE SHIELD | Admitting: Family

## 2014-02-17 VITALS — BP 130/83 | HR 85 | Temp 98.1°F | Resp 18 | Ht 65.5 in | Wt 202.0 lb

## 2014-02-17 DIAGNOSIS — J0100 Acute maxillary sinusitis, unspecified: Secondary | ICD-10-CM

## 2014-02-17 MED ORDER — AMOXICILLIN-POT CLAVULANATE 875-125 MG PO TABS: 1.0000 | ORAL_TABLET | Freq: Two times a day (BID) | ORAL | Status: DC

## 2014-02-17 NOTE — Progress Notes (Signed)
Subjective:    Patient ID: Amber [name], female    DOB: 1960-07-10, 54 y.o.   MRN: [id number]  HPI Amber [name] is here today for upper respiratory sx. She has a headache that started two weeks ago and nasal congestion that started 6 days ago. Cough started 3 days ago. Denies chills, fever. Reports fatigue that started 4 days ago. Also reports maxillary and frontal sinus pressure. Some fullness in left ear.  Chest congestion- yes Sneezing- yes Itching eyes-yes Sore throat- no Post-nasal drainage-yes Wheezing-no Purulent Nasal drainage-yes, yellowish   Review of Systems    see HPI  Past Medical History  Diagnosis Date  . SAB (spontaneous abortion)   . Elective abortion   . Ectopic pregnancy     RIGHT salpingostomy  . Hypertension   . GERD (gastroesophageal reflux disease)   . Anemia   . Sjogren's disease   . Glaucoma     both eyes  . Rheumatoid arthritis(714.0) 11/07/2011  . History of chicken pox     History   Social History  . Marital Status: Single    Spouse Name: N/A  . Number of Children: 0  . Years of Education: N/A   Occupational History  .     Social History Main Topics  . Smoking status: Never Smoker   . Smokeless tobacco: Never Used  . Alcohol Use: No  . Drug Use: No  . Sexual Activity: No     Comment: 1st intercourse- 9, partners- 5   Other Topics Concern  . Not on file   Social History Narrative   Regular exercise:  No   Caffeine Use: 2 cups coffee daily   No children   "Happily divorced"   Reports that she is involved at her church   Works with intellectually challenged adults   Born [place]- she came to [place] as adult.  Age 33.              Past Surgical History  Procedure Laterality Date  . Dilation and curettage of uterus      X 2  . Knee surgery      MENISCUS TEAR REPAIR  . Pelvic laparoscopy  1994    IN 1999 LAPAROSCOPY WITH INCIDENTAL ENTEROTOMY REQUIRING LAPAROTOMY  . Abdominal surgery      bowel repair     Family History  Problem Relation Age of Onset  . Hypertension Mother     died from heart disease  . Heart disease Mother     deceased, unknown cause  . Diabetes Sister   . Hypertension Sister   . Diabetes Sister     No Known Allergies  Current Outpatient Prescriptions on File Prior to Visit  Medication Sig Dispense Refill  . cholecalciferol (VITAMIN D) 400 UNITS TABS Take 400 Units by mouth at bedtime.     . ferrous sulfate 325 (65 FE) MG tablet Take 325 mg by mouth 3 (three) times daily with meals.     . hydroxychloroquine (PLAQUENIL) 200 MG tablet Take 200 mg by mouth 2 (two) times daily.     Marland Kitchen ibuprofen (ADVIL,MOTRIN) 800 MG tablet Take 800 mg by mouth every 8 (eight) hours as needed (For pain.).    Marland Kitchen lisinopril (PRINIVIL,ZESTRIL) 10 MG tablet Take 10 mg by mouth every morning.    . metoprolol (LOPRESSOR) 50 MG tablet Take 1 tablet (50 mg total) by mouth 2 (two) times daily. 60 tablet 5  . potassium chloride SA (K-DUR,KLOR-CON) 20 MEQ tablet Take  20 mEq by mouth at bedtime.    . traMADol (ULTRAM) 50 MG tablet Take 1 tablet (50 mg total) by mouth every 6 (six) hours as needed. 24 tablet 0  . TRAVATAN Z 0.004 % SOLN ophthalmic solution Place 1 drop into both eyes At bedtime.     No current facility-administered medications on file prior to visit.    BP 130/83 mmHg  Pulse 85  Temp(Src) 98.1 F (36.7 C) (Oral)  Resp 18  Ht 5' 5.5" (1.664 m)  Wt 202 lb (91.627 kg)  BMI 33.09 kg/m2  SpO2 99%  LMP 01/18/2014    Objective:   Physical Exam  Constitutional: She is oriented to person, place, and time. She appears well-developed and well-nourished. No distress.  HENT:  Head: Normocephalic and atraumatic.  Right Ear: Tympanic membrane and ear canal normal.  Left Ear: Tympanic membrane and ear canal normal.  Nose: Right sinus exhibits maxillary sinus tenderness and frontal sinus tenderness. Left sinus exhibits maxillary sinus tenderness and frontal sinus tenderness.   Mouth/Throat: No oropharyngeal exudate, posterior oropharyngeal edema or posterior oropharyngeal erythema.  Cardiovascular: Normal rate and regular rhythm.   No murmur heard. Pulmonary/Chest: Effort normal and breath sounds normal. No respiratory distress. She has no wheezes. She has no rales. She exhibits no tenderness.  Musculoskeletal: She exhibits no edema.  Neurological: She is alert and oriented to person, place, and time.  Skin: Skin is warm and dry.  Psychiatric: She has a normal mood and affect. Her behavior is normal. Judgment normal.          Assessment & Plan:  Sinusitus- will rx with augmentin, nasal sinus rinses, follow up if symptoms worsen or do not improve.

## 2014-02-17 NOTE — Progress Notes (Signed)
Pre visit review using our clinic review tool, if applicable. No additional management support is needed unless otherwise documented below in the visit note. 

## 2014-02-17 NOTE — Patient Instructions (Signed)
Please start augmentin for your sinus infection.  Purchase some nasal saline rinse and rinse sinuses several times a day until improved.  Call if symptoms worsen or if not improved in 3 days.

## 2014-03-08 ENCOUNTER — Other Ambulatory Visit: Payer: Self-pay | Admitting: Family

## 2014-03-08 ENCOUNTER — Other Ambulatory Visit: Payer: Self-pay | Admitting: Gynecology

## 2014-03-08 NOTE — Telephone Encounter (Signed)
Last filled:  Historical med  Please advise.

## 2014-03-08 NOTE — Telephone Encounter (Signed)
Please check if her PCP placed her on this medication I do not see anything on any of my notes

## 2014-03-08 NOTE — Telephone Encounter (Signed)
Last filled

## 2014-03-09 ENCOUNTER — Telehealth: Payer: Self-pay | Admitting: *Deleted

## 2014-03-09 NOTE — Telephone Encounter (Signed)
Received fax from [facility] for refill of HCTZ 12.5mg .  Medication no longer on medication list and was stopped by PCP in 10/2012 due to mildly low potassium.  Left message for pt to call back re: request.

## 2014-03-10 NOTE — Telephone Encounter (Signed)
Pt returned my call and left message to call her at [phone]. Left message on voicemail to call me back.

## 2014-03-11 NOTE — Telephone Encounter (Signed)
Pt returning your call regarding med request. Pt inquired about lisinopril advised her it was sent to pharmacy as per her chart.

## 2014-03-12 NOTE — Telephone Encounter (Signed)
Left message for pt to return my call.

## 2014-03-12 NOTE — Telephone Encounter (Signed)
returning your call best # [phone]

## 2014-03-12 NOTE — Telephone Encounter (Signed)
Spoke with pt. States she was requesting refill of Lisinopril, NOT Hctz. States that she picked up lisinopril yesterday. No further concerns.

## 2014-03-24 ENCOUNTER — Telehealth: Payer: Self-pay | Admitting: Family

## 2014-03-24 ENCOUNTER — Telehealth: Payer: Self-pay | Admitting: Oncology

## 2014-03-24 NOTE — Telephone Encounter (Signed)
Moved 5/20 appt to 5/19 due to Amber [name] on PAL. Left message for patient and mailed new schedule.

## 2014-03-24 NOTE — Telephone Encounter (Signed)
pre visit letter sent °

## 2014-04-09 ENCOUNTER — Encounter: Payer: Self-pay | Admitting: Family

## 2014-04-09 ENCOUNTER — Ambulatory Visit (INDEPENDENT_AMBULATORY_CARE_PROVIDER_SITE_OTHER): Payer: BLUE CROSS/BLUE SHIELD | Admitting: Family

## 2014-04-09 VITALS — BP 144/92 | HR 81 | Temp 98.2°F | Ht 61.0 in | Wt 209.8 lb

## 2014-04-09 DIAGNOSIS — R779 Abnormality of plasma protein, unspecified: Secondary | ICD-10-CM

## 2014-04-09 DIAGNOSIS — R7303 Prediabetes: Secondary | ICD-10-CM

## 2014-04-09 DIAGNOSIS — R739 Hyperglycemia, unspecified: Secondary | ICD-10-CM | POA: Diagnosis not present

## 2014-04-09 DIAGNOSIS — M545 Low back pain, unspecified: Secondary | ICD-10-CM

## 2014-04-09 DIAGNOSIS — M5442 Lumbago with sciatica, left side: Secondary | ICD-10-CM

## 2014-04-09 DIAGNOSIS — Z0000 Encounter for general adult medical examination without abnormal findings: Secondary | ICD-10-CM

## 2014-04-09 DIAGNOSIS — I10 Essential (primary) hypertension: Secondary | ICD-10-CM | POA: Diagnosis not present

## 2014-04-09 DIAGNOSIS — Z7185 Encounter for immunization safety counseling: Secondary | ICD-10-CM | POA: Insufficient documentation

## 2014-04-09 DIAGNOSIS — B351 Tinea unguium: Secondary | ICD-10-CM | POA: Insufficient documentation

## 2014-04-09 DIAGNOSIS — R7309 Other abnormal glucose: Secondary | ICD-10-CM

## 2014-04-09 DIAGNOSIS — M79605 Pain in left leg: Secondary | ICD-10-CM

## 2014-04-09 LAB — LIPID PANEL
Cholesterol: 132 mg/dL (ref 0–200)
HDL: 49.4 mg/dL (ref >39.00)
LDL Cholesterol: 70 mg/dL (ref 0–99)
NonHDL: 82.6
TRIGLYCERIDES: 62 mg/dL (ref 0.0–149.0)
Total CHOL/HDL Ratio: 3
VLDL: 12.4 mg/dL (ref 0.0–40.0)

## 2014-04-09 LAB — URINALYSIS, ROUTINE W REFLEX MICROSCOPIC
Bilirubin Urine: NEGATIVE
Hgb urine dipstick: NEGATIVE
KETONES UR: NEGATIVE
LEUKOCYTES UA: NEGATIVE
NITRITE: NEGATIVE
PH: 6 (ref 5.0–8.0)
RBC / HPF: NONE SEEN (ref 0–?)
SPECIFIC GRAVITY, URINE: 1.01 (ref 1.000–1.030)
Total Protein, Urine: NEGATIVE
URINE GLUCOSE: NEGATIVE
Urobilinogen, UA: 0.2 (ref 0.0–1.0)

## 2014-04-09 LAB — HEPATIC FUNCTION PANEL
ALT: 21 U/L (ref 0–35)
AST: 21 U/L (ref 0–37)
Albumin: 3.8 g/dL (ref 3.5–5.2)
Alkaline Phosphatase: 44 U/L (ref 39–117)
Bilirubin, Direct: 0.1 mg/dL (ref 0.0–0.3)
TOTAL PROTEIN: 9.5 g/dL — AB (ref 6.0–8.3)
Total Bilirubin: 0.5 mg/dL (ref 0.2–1.2)

## 2014-04-09 LAB — TSH: TSH: 1.27 u[IU]/mL (ref 0.35–4.50)

## 2014-04-09 LAB — HEMOGLOBIN A1C: Hgb A1c MFr Bld: 6 % (ref 4.6–6.5)

## 2014-04-09 MED ORDER — LISINOPRIL 20 MG PO TABS: 20.0000 mg | ORAL_TABLET | Freq: Every day | ORAL | Status: DC

## 2014-04-09 NOTE — Assessment & Plan Note (Signed)
Obtain follow up A1C.   

## 2014-04-09 NOTE — Assessment & Plan Note (Signed)
BP is elevated today and has been elevated at home.  Will increase lisinopril from 10mg  to 20mg  once daily.

## 2014-04-09 NOTE — Assessment & Plan Note (Signed)
I think her hip pain is really more of a sciatic pain.  Will refer to PT.  If symptoms worsen or do not improve, consider additional imaging of lumbar spine.

## 2014-04-09 NOTE — Assessment & Plan Note (Signed)
Obtain spep/upep for further evaluation.

## 2014-04-09 NOTE — Progress Notes (Signed)
Subjective:    Patient ID: Amber [name], female    DOB: 10-Jul-1960, 54 y.o.   MRN: [id number]  HPI  [name] is a 54 yr old female who presents today for cpx.  Patient presents today for complete physical.  Immunizations:  Up to date Diet:  Too much fast food/sugared beverages Exercise:  none Colonoscopy: 12/13- polyps- was told 5 yr follow up Pap Smear: 01/15/14- per GYN Mammogram: 02/05/13 per gyn.   Dental up to date Vision up to date  Left hip pain, and low back pain, radiates down the left leg- started in July 2015 and has been intermittent. Saw ortho, had cortisone shot in hip without improvement in her symptoms.   HTN- had reading at work 160/96 BP Readings from Last 3 Encounters:  04/09/14 144/92  02/17/14 130/83  01/15/14 144/88   Anemia- follows with hematology.  Lab work is reviewed and note is made of chronically elevated serum protein.    Review of Systems  Constitutional: Positive for fatigue. Negative for unexpected weight change.  HENT: Negative for hearing loss and rhinorrhea.   Eyes:       Wears glasses, sees eye doctor every 3 mos due to glaucoma  Respiratory: Negative for cough.   Cardiovascular: Negative for leg swelling.  Gastrointestinal: Negative for nausea, diarrhea and constipation.  Genitourinary: Negative for dysuria and frequency.       Irregular menses  Musculoskeletal: Negative for myalgias.  Skin: Negative for rash.  Neurological: Positive for headaches.       Frontal HA's improve with ibuprofen.    Hematological: Negative for adenopathy.  Psychiatric/Behavioral: Negative for dysphoric mood and agitation.   Past Medical History  Diagnosis Date  . SAB (spontaneous abortion)   . Elective abortion   . Ectopic pregnancy     RIGHT salpingostomy  . Hypertension   . GERD (gastroesophageal reflux disease)   . Anemia   . Sjogren's disease   . Glaucoma     both eyes  . Rheumatoid arthritis(714.0) 11/07/2011  . History of chicken  pox     History   Social History  . Marital Status: Single    Spouse Name: N/A  . Number of Children: 0  . Years of Education: N/A   Occupational History  .     Social History Main Topics  . Smoking status: Never Smoker   . Smokeless tobacco: Never Used  . Alcohol Use: No  . Drug Use: No  . Sexual Activity: Not on file     Comment: 1st intercourse- 17, partners- 5   Other Topics Concern  . Not on file   Social History Narrative   Regular exercise:  No   Caffeine Use: 2 cups coffee daily   No children   "Happily divorced"   Reports that she is involved at her church   Works with intellectually challenged adults   Born in [place]- she came to [place] as adult.  Age 89.              Past Surgical History  Procedure Laterality Date  . Dilation and curettage of uterus      X 2  . Knee surgery      MENISCUS TEAR REPAIR  . Pelvic laparoscopy  1994    IN 1999 LAPAROSCOPY WITH INCIDENTAL ENTEROTOMY REQUIRING LAPAROTOMY  . Abdominal surgery      bowel repair    Family History  Problem Relation Age of Onset  . Hypertension Mother  died from heart disease  . Heart disease Mother     deceased, unknown cause  . Diabetes Sister   . Hypertension Sister   . Diabetes Sister     No Known Allergies  Current Outpatient Prescriptions on File Prior to Visit  Medication Sig Dispense Refill  . cholecalciferol (VITAMIN D) 400 UNITS TABS Take 400 Units by mouth at bedtime.     . ferrous sulfate 325 (65 FE) MG tablet Take 325 mg by mouth 3 (three) times daily with meals.     . hydroxychloroquine (PLAQUENIL) 200 MG tablet Take 200 mg by mouth 2 (two) times daily.     Marland Kitchen ibuprofen (ADVIL,MOTRIN) 800 MG tablet Take 800 mg by mouth every 8 (eight) hours as needed (For pain.).    Marland Kitchen lisinopril (PRINIVIL,ZESTRIL) 10 MG tablet TAKE ONE TABLET BY MOUTH ONCE DAILY 30 tablet 5  . metoprolol (LOPRESSOR) 50 MG tablet Take 1 tablet (50 mg total) by mouth 2 (two) times daily. 60 tablet 5  .  potassium chloride SA (K-DUR,KLOR-CON) 20 MEQ tablet Take 20 mEq by mouth at bedtime.    . traMADol (ULTRAM) 50 MG tablet Take 1 tablet (50 mg total) by mouth every 6 (six) hours as needed. 24 tablet 0  . TRAVATAN Z 0.004 % SOLN ophthalmic solution Place 1 drop into both eyes At bedtime.     No current facility-administered medications on file prior to visit.    BP 144/92 mmHg  Pulse 81  Temp(Src) 98.2 F (36.8 C) (Oral)  Ht 5\' 1"  (1.549 m)  Wt 209 lb 12.8 oz (95.165 kg)  BMI 39.66 kg/m2  SpO2 98%  LMP 01/01/2014 (Approximate)        Objective:   Physical Exam  Physical Exam  Constitutional: She is oriented to person, place, and time. She appears well-developed and well-nourished. No distress.  HENT:  Head: Normocephalic and atraumatic.  Right Ear: Tympanic membrane and ear canal normal.  Left Ear: Tympanic membrane and ear canal normal.  Mouth/Throat: Oropharynx is clear and moist.  Eyes: Pupils are equal, round, and reactive to light. No scleral icterus.  Neck: Normal range of motion. No thyromegaly present.  Cardiovascular: Normal rate and regular rhythm.   No murmur heard. Pulmonary/Chest: Effort normal and breath sounds normal. No respiratory distress. He has no wheezes. She has no rales. She exhibits no tenderness.  Abdominal: Soft. Bowel sounds are normal. He exhibits no distension and no mass. There is no tenderness. There is no rebound and no guarding.  Musculoskeletal: She exhibits no edema. + tenderness to palpation of lumbar spine and left lateral lower back.  Lymphadenopathy:    She has no cervical adenopathy.  Neurological: She is alert and oriented to person, place, and time. She has 1+ bilateral patellar reflexes. She exhibits normal muscle tone. Coordination normal.  Skin: Skin is warm and dry.  Psychiatric: She has a normal mood and affect. Her behavior is normal. Judgment and thought content normal.          Assessment & Plan:           Assessment & Plan:

## 2014-04-09 NOTE — Patient Instructions (Signed)
Increase lisinopril from 10mg  to 20mg .   Complete lab work prior to leaving. You will be contacted about your mammogram and your referral to physical therapy. Please follow up in 2 weeks for a nurse visit bp check and lab work (bmet dx hypokalemia). Follow up in 4 months for office visit.

## 2014-04-09 NOTE — Assessment & Plan Note (Signed)
Discussed eliminating fast food and sugared beverages and adding regular exercise.  Obtain routine lab testing, refer for mammogram, immunizations reviewed and up to date.

## 2014-04-13 LAB — PROTEIN ELECTROPHORESIS, SERUM, WITH REFLEX
ALBUMIN ELP: 3.9 g/dL (ref 3.8–4.8)
Alpha-1-Globulin: 0.3 g/dL (ref 0.2–0.3)
Alpha-2-Globulin: 0.6 g/dL (ref 0.5–0.9)
Beta 2: 0.6 g/dL — ABNORMAL HIGH (ref 0.2–0.5)
Beta Globulin: 0.5 g/dL (ref 0.4–0.6)
GAMMA GLOBULIN: 3.2 g/dL — AB (ref 0.8–1.7)
Total Protein, Serum Electrophoresis: 9.1 g/dL — ABNORMAL HIGH (ref 6.1–8.1)

## 2014-04-13 LAB — PROTEIN ELECTROPHORESIS, URINE REFLEX: TOTAL PROTEIN, URINE: 9 mg/dL

## 2014-04-15 ENCOUNTER — Emergency Department (HOSPITAL_BASED_OUTPATIENT_CLINIC_OR_DEPARTMENT_OTHER)
Admission: EM | Admit: 2014-04-15 | Discharge: 2014-04-15 | Disposition: A | Discharge status: Home / Self Care | Payer: BLUE CROSS/BLUE SHIELD | Attending: Emergency Medicine | Admitting: Emergency Medicine

## 2014-04-15 ENCOUNTER — Ambulatory Visit (INDEPENDENT_AMBULATORY_CARE_PROVIDER_SITE_OTHER): Payer: BLUE CROSS/BLUE SHIELD | Admitting: Medical

## 2014-04-15 ENCOUNTER — Emergency Department (HOSPITAL_BASED_OUTPATIENT_CLINIC_OR_DEPARTMENT_OTHER): Payer: BLUE CROSS/BLUE SHIELD

## 2014-04-15 ENCOUNTER — Encounter (HOSPITAL_BASED_OUTPATIENT_CLINIC_OR_DEPARTMENT_OTHER): Payer: Self-pay

## 2014-04-15 ENCOUNTER — Encounter: Payer: Self-pay | Admitting: Medical

## 2014-04-15 VITALS — BP 180/100 | HR 82 | Temp 98.3°F | Ht 61.0 in | Wt 205.4 lb

## 2014-04-15 DIAGNOSIS — D649 Anemia, unspecified: Secondary | ICD-10-CM | POA: Insufficient documentation

## 2014-04-15 DIAGNOSIS — I10 Essential (primary) hypertension: Secondary | ICD-10-CM

## 2014-04-15 DIAGNOSIS — R519 Headache, unspecified: Secondary | ICD-10-CM | POA: Insufficient documentation

## 2014-04-15 DIAGNOSIS — M069 Rheumatoid arthritis, unspecified: Secondary | ICD-10-CM | POA: Diagnosis not present

## 2014-04-15 DIAGNOSIS — Z8719 Personal history of other diseases of the digestive system: Secondary | ICD-10-CM | POA: Insufficient documentation

## 2014-04-15 DIAGNOSIS — H409 Unspecified glaucoma: Secondary | ICD-10-CM | POA: Insufficient documentation

## 2014-04-15 DIAGNOSIS — Z79899 Other long term (current) drug therapy: Secondary | ICD-10-CM | POA: Diagnosis not present

## 2014-04-15 DIAGNOSIS — R51 Headache: Secondary | ICD-10-CM | POA: Insufficient documentation

## 2014-04-15 MED ORDER — METOCLOPRAMIDE HCL 5 MG/ML IJ SOLN
10.0000 mg | Freq: Once | INTRAMUSCULAR | Status: AC
  Administered 2014-04-15: 10 mg via INTRAVENOUS
  Filled 2014-04-15: qty 2

## 2014-04-15 MED ORDER — METOPROLOL TARTRATE 50 MG PO TABS: 50.0000 mg | ORAL_TABLET | Freq: Two times a day (BID) | ORAL | Status: DC

## 2014-04-15 MED ORDER — DIPHENHYDRAMINE HCL 50 MG/ML IJ SOLN
25.0000 mg | Freq: Once | INTRAMUSCULAR | Status: AC
  Administered 2014-04-15: 25 mg via INTRAVENOUS
  Filled 2014-04-15: qty 1

## 2014-04-15 MED ORDER — SODIUM CHLORIDE 0.9 % IV BOLUS (SEPSIS)
1000.0000 mL | Freq: Once | INTRAVENOUS | Status: AC
  Administered 2014-04-15: 1000 mL via INTRAVENOUS

## 2014-04-15 NOTE — Progress Notes (Signed)
Subjective:    Patient ID: Amber [name], female    DOB: November 19, 1960, 54 y.o.   MRN: [id number]  HPI  Pt in with some bp elevation today. Pt has been checking her bp. This am 169/84. Pt saw melissa on Friday. She had bp increased to lisinopril 20 mg and was on 10 mg. Pt is also on lopressor 50 mg bid.   Pt headache is severe. She states pain high level. HA makes her nausea. No vomited. No blurred vision. No slurred speech. Pt not dizzy today. Pt ha is 11/10  On Tuesday 182/102.   Pt tells me when questioned this is worst ha of her life. Pain has been gradually getting worse since Monday.       Review of Systems  Constitutional: Negative for fever, chills and fatigue.  Respiratory: Negative for cough, chest tightness and wheezing.   Cardiovascular: Negative for chest pain and palpitations.  Musculoskeletal: Negative for back pain.  Neurological: Positive for headaches. Negative for dizziness, tremors, seizures, syncope, facial asymmetry, speech difficulty, weakness, light-headedness and numbness.  Hematological: Negative for adenopathy. Does not bruise/bleed easily.   Past Medical History  Diagnosis Date  . SAB (spontaneous abortion)   . Elective abortion   . Ectopic pregnancy     RIGHT salpingostomy  . Hypertension   . GERD (gastroesophageal reflux disease)   . Anemia   . Sjogren's disease   . Glaucoma     both eyes  . Rheumatoid arthritis(714.0) 11/07/2011  . History of chicken pox     History   Social History  . Marital Status: Single    Spouse Name: N/A  . Number of Children: 0  . Years of Education: N/A   Occupational History  .     Social History Main Topics  . Smoking status: Never Smoker   . Smokeless tobacco: Never Used  . Alcohol Use: No  . Drug Use: No  . Sexual Activity: Not on file     Comment: 1st intercourse- 17, partners- 5   Other Topics Concern  . Not on file   Social History Narrative   Regular exercise:  No   Caffeine Use: 2  cups coffee daily   No children   "Happily divorced"   Reports that she is involved at her church   Works with intellectually challenged adults   Born [place]- she came to [place] as adult.  Age 89.              Past Surgical History  Procedure Laterality Date  . Dilation and curettage of uterus      X 2  . Knee surgery      MENISCUS TEAR REPAIR  . Pelvic laparoscopy  1994    IN 1999 LAPAROSCOPY WITH INCIDENTAL ENTEROTOMY REQUIRING LAPAROTOMY  . Abdominal surgery      bowel repair    Family History  Problem Relation Age of Onset  . Hypertension Mother     died from heart disease  . Heart disease Mother     deceased, unknown cause  . Diabetes Sister   . Hypertension Sister   . Diabetes Sister     No Known Allergies  Current Outpatient Prescriptions on File Prior to Visit  Medication Sig Dispense Refill  . cholecalciferol (VITAMIN D) 400 UNITS TABS Take 400 Units by mouth at bedtime.     . ferrous sulfate 325 (65 FE) MG tablet Take 325 mg by mouth 3 (three) times daily with meals.     Marland Kitchen  hydroxychloroquine (PLAQUENIL) 200 MG tablet Take 200 mg by mouth 2 (two) times daily.     Marland Kitchen ibuprofen (ADVIL,MOTRIN) 800 MG tablet Take 800 mg by mouth every 8 (eight) hours as needed (For pain.).    Marland Kitchen lisinopril (PRINIVIL,ZESTRIL) 20 MG tablet Take 1 tablet (20 mg total) by mouth daily. 30 tablet 2  . potassium chloride SA (K-DUR,KLOR-CON) 20 MEQ tablet Take 20 mEq by mouth at bedtime.    . terbinafine (LAMISIL) 250 MG tablet Take 250 mg by mouth daily.  0  . traMADol (ULTRAM) 50 MG tablet Take 1 tablet (50 mg total) by mouth every 6 (six) hours as needed. 24 tablet 0  . TRAVATAN Z 0.004 % SOLN ophthalmic solution Place 1 drop into both eyes At bedtime.     No current facility-administered medications on file prior to visit.    BP 156/94 mmHg  Pulse 82  Temp(Src) 98.3 F (36.8 C) (Oral)  Ht 5\' 1"  (1.549 m)  Wt 205 lb 6.4 oz (93.169 kg)  BMI 38.83 kg/m2  SpO2 97%  LMP 01/01/2014  (Approximate)       Objective:   Physical Exam  General Mental Status- Alert. General Appearance- Not in acute distress.   Skin General: Color- Normal Color. Moisture- Normal Moisture.  Neck Carotid Arteries- Normal color. Moisture- Normal Moisture. No carotid bruits. No JVD.  Chest and Lung Exam Auscultation: Breath Sounds:-Normal.  Cardiovascular Auscultation:Rythm- Regular. Murmurs & Other Heart Sounds:Auscultation of the heart reveals- No Murmurs.  Abdomen Inspection:-Inspeection Normal. Palpation/Percussion:Note:No mass. Palpation and Percussion of the abdomen reveal- Non Tender, Non Distended + BS, no rebound or guarding.    Neurologic Cranial Nerve exam:- CN III-XII intact(No nystagmus), symmetric smile. Drift Test:- No drift. Romberg Exam:- Negative.  Heal to Toe Gait exam:-Normal. Finger to Nose:- Normal/Intact Strength:- 5/5 equal and symmetric strength both upper and lower extremities.      Assessment & Plan:

## 2014-04-15 NOTE — Patient Instructions (Addendum)
HTN (hypertension) bp 180/100 checked twice by myself. Also worse ha of life 11/10 per pt.   Therefore, I want pt evaluated in ED now. Our staff to walk down with pt to ED.  I talked with charge nurse and notified of pt presentation.  Follow up here post ED eval.   Headache, worsening Worse ha in life. BP quite high. Sent down to ED.

## 2014-04-15 NOTE — Assessment & Plan Note (Signed)
Worse ha in life. BP quite high. Sent down to ED.

## 2014-04-15 NOTE — Progress Notes (Signed)
Pre visit review using our clinic review tool, if applicable. No additional management support is needed unless otherwise documented below in the visit note. 

## 2014-04-15 NOTE — Assessment & Plan Note (Signed)
bp 180/100 checked twice by myself. Also worse ha of life 11/10 per pt.   Therefore, I want pt evaluated in ED now. Our staff to walk down with pt to ED.  I talked with charge nurse and notified of pt presentation.  Follow up here post ED eval.

## 2014-04-15 NOTE — ED Provider Notes (Signed)
CSN: [id number]     Arrival date & time 04/15/14  1813 History   First MD Initiated Contact with Patient 04/15/14 1837     Chief Complaint  Patient presents with  . Headache     (Consider location/radiation/quality/duration/timing/severity/associated sxs/prior Treatment) HPI  Amber [name] is a 54 y.o. female with PMH of hypertension, anemia, acid reflux, RA presenting with today That started 4 days ago that was intermittent but acute worsening in the last few days. Patient reports headache developed gradually. She states is like other headaches she has had with increased severity as well as different location on the left side. Patient was sent from her primary care provider for concern of "worse headache of life". Patient denies any visual changes, slurred speech, weakness. Patient with history of hypertension. No fevers or chills. No recent head injury or loss of consciousness. No neck pain. Patient with improvement with Advil.   Past Medical History  Diagnosis Date  . SAB (spontaneous abortion)   . Elective abortion   . Ectopic pregnancy     RIGHT salpingostomy  . Hypertension   . GERD (gastroesophageal reflux disease)   . Anemia   . Sjogren's disease   . Glaucoma     both eyes  . Rheumatoid arthritis(714.0) 11/07/2011  . History of chicken pox    Past Surgical History  Procedure Laterality Date  . Dilation and curettage of uterus      X 2  . Knee surgery      MENISCUS TEAR REPAIR  . Pelvic laparoscopy  1994    IN 1999 LAPAROSCOPY WITH INCIDENTAL ENTEROTOMY REQUIRING LAPAROTOMY  . Abdominal surgery      bowel repair   Family History  Problem Relation Age of Onset  . Hypertension Mother     died from heart disease  . Heart disease Mother     deceased, unknown cause  . Diabetes Sister   . Hypertension Sister   . Diabetes Sister    History  Substance Use Topics  . Smoking status: Never Smoker   . Smokeless tobacco: Never Used  . Alcohol Use: No   OB  History    Gravida Para Term Preterm AB TAB SAB Ectopic Multiple Living   3 0   2   1  0     Review of Systems 10 Systems reviewed and are negative for acute change except as noted in the HPI.    Allergies  Review of patient's allergies indicates no known allergies.  Home Medications   Prior to Admission medications   Medication Sig Start Date End Date Taking? Authorizing Provider  cholecalciferol (VITAMIN D) 400 UNITS TABS Take 400 Units by mouth at bedtime.     Historical Provider, MD  ferrous sulfate 325 (65 FE) MG tablet Take 325 mg by mouth 3 (three) times daily with meals.     Historical Provider, MD  hydroxychloroquine (PLAQUENIL) 200 MG tablet Take 200 mg by mouth 2 (two) times daily.     Historical Provider, MD  ibuprofen (ADVIL,MOTRIN) 800 MG tablet Take 800 mg by mouth every 8 (eight) hours as needed (For pain.).    Historical Provider, MD  lisinopril (PRINIVIL,ZESTRIL) 20 MG tablet Take 1 tablet (20 mg total) by mouth daily. 04/09/14   [name], NP  metoprolol (LOPRESSOR) 50 MG tablet Take 1 tablet (50 mg total) by mouth 2 (two) times daily. 04/15/14   Cam[place], PA-C  potassium chloride SA (K-DUR,KLOR-CON) 20 MEQ tablet Take 20 mEq by  mouth at bedtime.    Historical Provider, MD  terbinafine (LAMISIL) 250 MG tablet Take 250 mg by mouth daily. 04/07/14   Historical Provider, MD  traMADol (ULTRAM) 50 MG tablet Take 1 tablet (50 mg total) by mouth every 6 (six) hours as needed. 09/24/13   Meriam Sprague Saguier, PA-C  TRAVATAN Z 0.004 % SOLN ophthalmic solution Place 1 drop into both eyes At bedtime. 10/25/11   Historical Provider, MD   BP 172/78 mmHg  Pulse 83  Temp(Src) 98.2 F (36.8 C) (Oral)  Resp 16  Ht 5\' 1"  (1.549 m)  Wt 205 lb (92.987 kg)  BMI 38.75 kg/m2  SpO2 100%  LMP 01/01/2014 (Approximate) Physical Exam  Constitutional: She appears well-developed and well-nourished. No distress.  HENT:  Head: Normocephalic and atraumatic.  Mouth/Throat:  Oropharynx is clear and moist.  Eyes: Conjunctivae and EOM are normal. Pupils are equal, round, and reactive to light. Right eye exhibits no discharge. Left eye exhibits no discharge.  Neck: Normal range of motion. Neck supple.  No nuchal rigidity  Cardiovascular: Normal rate and regular rhythm.   Pulmonary/Chest: Effort normal and breath sounds normal. No respiratory distress. She has no wheezes.  Abdominal: Soft. Bowel sounds are normal. She exhibits no distension. There is no tenderness.  Neurological: She is alert. No cranial nerve deficit. Coordination normal.  Speech is clear and goal oriented. Peripheral visual fields intact. Strength 5/5 in upper and lower extremities. Sensation intact. Intact rapid alternating movements, finger to nose, and heel to shin. Negative Romberg. No pronator drift. Normal gait.   Skin: Skin is warm and dry. She is not diaphoretic.  Nursing note and vitals reviewed.   ED Course  Procedures (including critical care time) Labs Review Labs Reviewed - No data to display  Imaging Review Ct Head Wo Contrast  04/15/2014   CLINICAL DATA:  Headache for 3 days. Lightheadedness and dizziness. Initial encounter.  EXAM: CT HEAD WITHOUT CONTRAST  TECHNIQUE: Contiguous axial images were obtained from the base of the skull through the vertex without intravenous contrast.  COMPARISON:  None.  FINDINGS: There is no evidence of acute intracranial hemorrhage, mass lesion, brain edema or extra-axial fluid collection. The ventricles and subarachnoid spaces are appropriately sized for age. There is no CT evidence of acute cortical infarction. There is patchy low-density in the periventricular and subcortical white matter, most likely secondary to chronic small vessel ischemic change.  The visualized paranasal sinuses, mastoid air cells and middle ears are clear. The calvarium is intact.  IMPRESSION: 1. No acute intracranial findings demonstrated. 2. Moderate periventricular and  subcortical white matter disease, likely chronic small vessel ischemic changes.   Electronically Signed   By: Richardean Sale M.D.   On: 04/15/2014 20:07     EKG Interpretation None      MDM   Final diagnoses:  Nonintractable headache   Patient referred from primary care for "worse headache of life". Headache was gradual in onset and she denies any neurological symptoms. No fevers or chills. Patient's neurological exam without deficits. No meningismus. CT head without evidence of acute intracranial abnormality. Evidence of likely chronic small ischemic changes as discussed with patient. Due to the gradual nature of patient's headache, no neurological symptoms, normal neurological exam I doubt SAH, ICH, meningitis. Discussed the option of LP, risks and benefits with pt. Pt refused. Discussed strict return precautions and pt reliable for follow up. Referral to neurologist and follow up with PCP.  Discussed return precautions with patient. Discussed all results  and patient verbalizes understanding and agrees with plan.  This is a shared patient. This patient was discussed with the physician who saw and evaluated the patient and agrees with the plan.     Al Corpus, PA-C 04/15/14 2124  Sherwood Gambler, MD 04/17/14 [phone]

## 2014-04-15 NOTE — ED Notes (Signed)
Pt reports HA since Monday. Also sts HTN and congestion

## 2014-04-15 NOTE — Discharge Instructions (Signed)
Return to the emergency room with worsening of symptoms, new symptoms or with symptoms that are concerning , especially severe worsening of headache, visual or speech changes, weakness in face, arms or legs. Ibuprofen 400mg  (2 tablets 200mg ) every 5-6 hours for 3-5 days. Please call your doctor for a followup appointment within 24-48 hours. When you talk to your doctor please let them know that you were seen in the emergency department and have them acquire all of your records so that they can discuss the findings with you and formulate a treatment plan to fully care for your new and ongoing problems.  Call to make appointment with neurology for further work up. Read below information and follow recommendations. General Headache Without Cause A headache is pain or discomfort felt around the head or neck area. The specific cause of a headache may not be found. There are many causes and types of headaches. A few common ones are:  Tension headaches.  Migraine headaches.  Cluster headaches.  Chronic daily headaches. HOME CARE INSTRUCTIONS   Keep all follow-up appointments with your caregiver or any specialist referral.  Only take over-the-counter or prescription medicines for pain or discomfort as directed by your caregiver.  Lie down in a dark, quiet room when you have a headache.  Keep a headache journal to find out what may trigger your migraine headaches. For example, write down:  What you eat and drink.  How much sleep you get.  Any change to your diet or medicines.  Try massage or other relaxation techniques.  Put ice packs or heat on the head and neck. Use these 3 to 4 times per day for 15 to 20 minutes each time, or as needed.  Limit stress.  Sit up straight, and do not tense your muscles.  Quit smoking if you smoke.  Limit alcohol use.  Decrease the amount of caffeine you drink, or stop drinking caffeine.  Eat and sleep on a regular schedule.  Get 7 to 9 hours of  sleep, or as recommended by your caregiver.  Keep lights dim if bright lights bother you and make your headaches worse. SEEK MEDICAL CARE IF:   You have problems with the medicines you were prescribed.  Your medicines are not working.  You have a change from the usual headache.  You have nausea or vomiting. SEEK IMMEDIATE MEDICAL CARE IF:   Your headache becomes severe.  You have a fever.  You have a stiff neck.  You have loss of vision.  You have muscular weakness or loss of muscle control.  You start losing your balance or have trouble walking.  You feel faint or pass out.  You have severe symptoms that are different from your first symptoms. MAKE SURE YOU:   Understand these instructions.  Will watch your condition.  Will get help right away if you are not doing well or get worse. Document Released: 12/18/2004 Document Revised: 03/12/2011 Document Reviewed: 01/03/2011 [facility] Patient Information 2015 [place], [place]. This information is not intended to replace advice given to you by your health care provider. Make sure you discuss any questions you have with your health care provider.

## 2014-04-19 ENCOUNTER — Ambulatory Visit (INDEPENDENT_AMBULATORY_CARE_PROVIDER_SITE_OTHER): Payer: BLUE CROSS/BLUE SHIELD | Admitting: Family

## 2014-04-19 ENCOUNTER — Encounter: Payer: Self-pay | Admitting: Family

## 2014-04-19 VITALS — BP 122/82 | HR 64 | Temp 97.9°F | Resp 16 | Ht 65.5 in | Wt 203.2 lb

## 2014-04-19 DIAGNOSIS — G43909 Migraine, unspecified, not intractable, without status migrainosus: Secondary | ICD-10-CM

## 2014-04-19 DIAGNOSIS — G43019 Migraine without aura, intractable, without status migrainosus: Secondary | ICD-10-CM | POA: Diagnosis not present

## 2014-04-19 HISTORY — DX: Migraine, unspecified, not intractable, without status migrainosus: G43.909

## 2014-04-19 MED ORDER — SUMATRIPTAN SUCCINATE 50 MG PO TABS: 50.0000 mg | ORAL_TABLET | ORAL | Status: DC | PRN

## 2014-04-19 NOTE — Progress Notes (Signed)
Subjective:    Patient ID: Amber [name], female    DOB: 19-Aug-1960, 55 y.o.   MRN: [id number]  HPI  Amber [name] is a 54 yr old female who presents today for ED follow up. ED records are reviewed.  She was evaluated in the ED on 04/15/14 with 4 day hx of worsening HA.  She underwent a CT head which noted No acute intracranial findings demonstrated and moderate periventricular and subcortical white matter disease, likely chronic small vessel ischemic changes. She was given Benadryl and reglan which helped her HA. She reports her current headache is 7/10 (reports 11/10 the day she went tht the ED).  Current HA is temporal bilateral, throbbing.  Has taken 2 advil at 10:15 AM.  She denies nausea, photophobia. + phonophobia.  Denies acute visual changes.  Denies associated aura.   Review of Systems See HPI  Past Medical History  Diagnosis Date  . SAB (spontaneous abortion)   . Elective abortion   . Ectopic pregnancy     RIGHT salpingostomy  . Hypertension   . GERD (gastroesophageal reflux disease)   . Anemia   . Sjogren's disease   . Glaucoma     both eyes  . Rheumatoid arthritis(714.0) 11/07/2011  . History of chicken pox     History   Social History  . Marital Status: Single    Spouse Name: N/A  . Number of Children: 0  . Years of Education: N/A   Occupational History  .     Social History Main Topics  . Smoking status: Never Smoker   . Smokeless tobacco: Never Used  . Alcohol Use: No  . Drug Use: No  . Sexual Activity: Not on file     Comment: 1st intercourse- 17, partners- 5   Other Topics Concern  . Not on file   Social History Narrative   Regular exercise:  No   Caffeine Use: 2 cups coffee daily   No children   "Happily divorced"   Reports that she is involved at her church   Works with intellectually challenged adults   Born in [place]- she came to [place] as adult.  Age 45.              Past Surgical History  Procedure Laterality Date  . Dilation  and curettage of uterus      X 2  . Knee surgery      MENISCUS TEAR REPAIR  . Pelvic laparoscopy  1994    IN 1999 LAPAROSCOPY WITH INCIDENTAL ENTEROTOMY REQUIRING LAPAROTOMY  . Abdominal surgery      bowel repair    Family History  Problem Relation Age of Onset  . Hypertension Mother     died from heart disease  . Heart disease Mother     deceased, unknown cause  . Diabetes Sister   . Hypertension Sister   . Diabetes Sister     No Known Allergies  Current Outpatient Prescriptions on File Prior to Visit  Medication Sig Dispense Refill  . cholecalciferol (VITAMIN D) 400 UNITS TABS Take 400 Units by mouth at bedtime.     . ferrous sulfate 325 (65 FE) MG tablet Take 325 mg by mouth 3 (three) times daily with meals.     . hydroxychloroquine (PLAQUENIL) 200 MG tablet Take 200 mg by mouth 2 (two) times daily.     Marland Kitchen ibuprofen (ADVIL,MOTRIN) 800 MG tablet Take 800 mg by mouth every 8 (eight) hours as needed (For pain.).    Marland Kitchen  lisinopril (PRINIVIL,ZESTRIL) 20 MG tablet Take 1 tablet (20 mg total) by mouth daily. 30 tablet 2  . metoprolol (LOPRESSOR) 50 MG tablet Take 1 tablet (50 mg total) by mouth 2 (two) times daily. 60 tablet 5  . potassium chloride SA (K-DUR,KLOR-CON) 20 MEQ tablet Take 20 mEq by mouth at bedtime.    . terbinafine (LAMISIL) 250 MG tablet Take 250 mg by mouth daily.  0  . traMADol (ULTRAM) 50 MG tablet Take 1 tablet (50 mg total) by mouth every 6 (six) hours as needed. 24 tablet 0  . TRAVATAN Z 0.004 % SOLN ophthalmic solution Place 1 drop into both eyes At bedtime.     No current facility-administered medications on file prior to visit.    BP 122/82 mmHg  Pulse 64  Temp(Src) 97.9 F (36.6 C) (Oral)  Resp 16  Ht 5' 5.5" (1.664 m)  Wt 203 lb 3.2 oz (92.171 kg)  BMI 33.29 kg/m2  SpO2 96%  LMP 01/01/2014 (Approximate)       Objective:   Physical Exam  Constitutional: She is oriented to person, place, and time. She appears well-developed and well-nourished.  No distress.  HENT:  Head: Normocephalic and atraumatic.  Right Ear: Tympanic membrane and ear canal normal.  Left Ear: Tympanic membrane and ear canal normal.  Eyes: EOM are normal. Pupils are equal, round, and reactive to light.  Cardiovascular: Normal rate and regular rhythm.   No murmur heard. Pulmonary/Chest: Effort normal and breath sounds normal. No respiratory distress. She has no wheezes. She has no rales. She exhibits no tenderness.  Neurological: She is alert and oriented to person, place, and time. She exhibits normal muscle tone.          Assessment & Plan:

## 2014-04-19 NOTE — Assessment & Plan Note (Signed)
Uncontrolled.  Trial of imitrex.  Advised pt to call if no improvement with imitrex.  Pt verbalizes understanding.

## 2014-04-19 NOTE — Patient Instructions (Signed)
Start imitrex as needed. Call if headache symptoms worsen or if symptoms do not improve.  Please schedule a follow up appointment in 1 month.

## 2014-04-19 NOTE — Progress Notes (Signed)
Pre visit review using our clinic review tool, if applicable. No additional management support is needed unless otherwise documented below in the visit note. 

## 2014-04-21 ENCOUNTER — Encounter: Payer: Self-pay | Admitting: Gynecology

## 2014-05-18 ENCOUNTER — Encounter: Payer: Self-pay | Admitting: Gynecology

## 2014-05-18 ENCOUNTER — Ambulatory Visit (INDEPENDENT_AMBULATORY_CARE_PROVIDER_SITE_OTHER): Payer: BLUE CROSS/BLUE SHIELD | Admitting: Gynecology

## 2014-05-18 VITALS — BP 134/78

## 2014-05-18 DIAGNOSIS — D500 Iron deficiency anemia secondary to blood loss (chronic): Secondary | ICD-10-CM

## 2014-05-18 DIAGNOSIS — D251 Intramural leiomyoma of uterus: Secondary | ICD-10-CM

## 2014-05-18 DIAGNOSIS — N950 Postmenopausal bleeding: Secondary | ICD-10-CM | POA: Diagnosis not present

## 2014-05-18 LAB — CBC WITH DIFFERENTIAL/PLATELET
BASOS ABS: 0 10*3/uL (ref 0.0–0.1)
Basophils Relative: 0 % (ref 0–1)
EOS PCT: 1 % (ref 0–5)
Eosinophils Absolute: 0 10*3/uL (ref 0.0–0.7)
HCT: 31.2 % — ABNORMAL LOW (ref 36.0–46.0)
HEMOGLOBIN: 10.2 g/dL — AB (ref 12.0–15.0)
Lymphocytes Relative: 54 % — ABNORMAL HIGH (ref 12–46)
Lymphs Abs: 1.9 10*3/uL (ref 0.7–4.0)
MCH: 27 pg (ref 26.0–34.0)
MCHC: 32.7 g/dL (ref 30.0–36.0)
MCV: 82.5 fL (ref 78.0–100.0)
MPV: 9.7 fL (ref 8.6–12.4)
Monocytes Absolute: 0.3 10*3/uL (ref 0.1–1.0)
Monocytes Relative: 9 % (ref 3–12)
Neutro Abs: 1.3 10*3/uL — ABNORMAL LOW (ref 1.7–7.7)
Neutrophils Relative %: 36 % — ABNORMAL LOW (ref 43–77)
PLATELETS: 216 10*3/uL (ref 150–400)
RBC: 3.78 MIL/uL — ABNORMAL LOW (ref 3.87–5.11)
RDW: 14.4 % (ref 11.5–15.5)
WBC: 3.5 10*3/uL — ABNORMAL LOW (ref 4.0–10.5)

## 2014-05-18 MED ORDER — MEGESTROL ACETATE 40 MG PO TABS: 40.0000 mg | ORAL_TABLET | Freq: Two times a day (BID) | ORAL | Status: DC

## 2014-05-18 NOTE — Progress Notes (Addendum)
   Patient is a 54 year old who presented to the office today she has been bleeding now for 2 weeks. Her last menstrual be was in January. Patient not sexually active. Patient for several years has been dealing with the addition of menorrhagia. 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 Patient twice in 2014 and 1 since September 2015 patient received IV iron infusion. Her last hemoglobin was 13.3 with hematocrit 40.6 platelet count 256,000 white blood count 4.0 this or shortly after the IV iron infusion at the end of 2015. She is currently taking iron supplementation twice a day. She is scheduled to follow-up with the medical oncologist. Patient reports normal bone marrow aspiration study last year.  1 spontaneous AB  1 elective AB  1 right ectopic resulted in salpingostomy (1994)  Laparoscopic attempt of removing ovarian cyst by another physician in [place] resulted in an incidental enterotomy regarding laparotomy    Patient's last ultrasound chamber 2015: Uterus measured 4.8 x 9.0 x 6.9 cm with endometrial stripe is 7.5 mm. Which has increased since last ultrasound scan as well as the sizes of these fibroids. Previously seen cystic mass has completely resolved. Both right and left ovary were normal.  Patient is now been on hormonal replacement therapy denies any vasomotor symptoms.  Exam: Blood pressure 134/78 Gen. appearance well-developed well-nourished above-mentioned complaint Abdomen: Soft nontender no rebound or guarding midline scar evident Pelvic: Bartholin urethra Skene was within normal limits Vagina blood present in the vaginal vault Cervix: Small amount of oozing from the external cervical os Uterus: 10-12 week size irregular nontender Adnexa: No palpable masses or tenderness Rectal exam not done  Patient was counseled for endometrial biopsy. The  cervix was cleansed with Betadine solution. A Pipelle was introduced into the uterine cavity uterus measured approximately 10 cm. Moderate amount of tissue was obtained and was negative histological evaluation.  Assessment/plan: Postmenopausal bleeding patient with history of fibroid uterus on no hormone replacement therapy status post endometrial biopsy today result pending at time of this dictation. Patient will be placed on Megace 40 mg one by mouth twice a day the next 2 weeks and return back to the office for sonohysterogram to better assess intrauterine cavity. Will notify the patient was a result of pathology as well. Also recommend she begin taking that needs to schedule her total abdominal hysterectomy with bilateral salpingo-oophorectomy in the near future. We'll check her CBC today.

## 2014-05-18 NOTE — Patient Instructions (Signed)
Endometrial Biopsy, Care After Refer to this sheet in the next few weeks. These instructions provide you with information on caring for yourself after your procedure. Your health care provider may also give you more specific instructions. Your treatment has been planned according to current medical practices, but problems sometimes occur. Call your health care provider if you have any problems or questions after your procedure. WHAT TO EXPECT AFTER THE PROCEDURE After your procedure, it is typical to have the following:  You may have mild cramping and a small amount of vaginal bleeding for a few days after the procedure. This is normal. HOME CARE INSTRUCTIONS  Only take over-the-counter or prescription medicine as directed by your health care provider.  Do not douche, use tampons, or have sexual intercourse until your health care provider approves.  Follow your health care provider's instructions regarding any activity restrictions, such as strenuous exercise or heavy lifting. SEEK MEDICAL CARE IF:  You have heavy bleeding or bleeding longer than 2 days after the procedure.  You have bad smelling drainage from your vagina.  You have a fever and chills.  Youhave severe lower stomach (abdominal) pain. SEEK IMMEDIATE MEDICAL CARE IF:  You have severe cramps in your stomach or back.  You pass large blood clots.  Your bleeding increases.  You become weak or lightheaded, or you pass out. Document Released: 10/08/2012 Document Reviewed: 10/08/2012 ExitCare Patient Information 2015 [employer]. This information is not intended to replace advice given to you by your health care provider. Make sure you discuss any questions you have with your health care provider.  

## 2014-05-20 ENCOUNTER — Encounter: Payer: Self-pay | Admitting: Physician Assistant

## 2014-05-20 ENCOUNTER — Ambulatory Visit (HOSPITAL_BASED_OUTPATIENT_CLINIC_OR_DEPARTMENT_OTHER): Payer: BLUE CROSS/BLUE SHIELD | Admitting: Physician Assistant

## 2014-05-20 ENCOUNTER — Other Ambulatory Visit (HOSPITAL_BASED_OUTPATIENT_CLINIC_OR_DEPARTMENT_OTHER): Payer: BLUE CROSS/BLUE SHIELD

## 2014-05-20 ENCOUNTER — Telehealth: Payer: Self-pay | Admitting: Oncology

## 2014-05-20 VITALS — BP 136/74 | HR 70 | Temp 98.7°F | Resp 18 | Ht 65.5 in | Wt 206.4 lb

## 2014-05-20 DIAGNOSIS — D509 Iron deficiency anemia, unspecified: Secondary | ICD-10-CM

## 2014-05-20 DIAGNOSIS — N921 Excessive and frequent menstruation with irregular cycle: Secondary | ICD-10-CM | POA: Diagnosis not present

## 2014-05-20 DIAGNOSIS — D500 Iron deficiency anemia secondary to blood loss (chronic): Secondary | ICD-10-CM

## 2014-05-20 DIAGNOSIS — D72819 Decreased white blood cell count, unspecified: Secondary | ICD-10-CM

## 2014-05-20 DIAGNOSIS — N924 Excessive bleeding in the premenopausal period: Secondary | ICD-10-CM

## 2014-05-20 LAB — COMPREHENSIVE METABOLIC PANEL (CC13)
ALBUMIN: 3.1 g/dL — AB (ref 3.5–5.0)
ALT: 21 U/L (ref 0–55)
AST: 18 U/L (ref 5–34)
Alkaline Phosphatase: 45 U/L (ref 40–150)
Anion Gap: 7 mEq/L (ref 3–11)
BUN: 10.2 mg/dL (ref 7.0–26.0)
CO2: 22 mEq/L (ref 22–29)
Calcium: 8.7 mg/dL (ref 8.4–10.4)
Chloride: 109 mEq/L (ref 98–109)
Creatinine: 0.7 mg/dL (ref 0.6–1.1)
EGFR: >90 mL/min/{1.73_m2} (ref >90)
GLUCOSE: 84 mg/dL (ref 70–140)
POTASSIUM: 3.9 meq/L (ref 3.5–5.1)
SODIUM: 138 meq/L (ref 136–145)
Total Bilirubin: 0.32 mg/dL (ref 0.20–1.20)
Total Protein: 8 g/dL (ref 6.4–8.3)

## 2014-05-20 LAB — CBC WITH DIFFERENTIAL/PLATELET
BASO%: 0.3 % (ref 0.0–2.0)
Basophils Absolute: 0 10*3/uL (ref 0.0–0.1)
EOS ABS: 0.1 10*3/uL (ref 0.0–0.5)
EOS%: 3.2 % (ref 0.0–7.0)
HCT: 31.1 % — ABNORMAL LOW (ref 34.8–46.6)
HGB: 10.1 g/dL — ABNORMAL LOW (ref 11.6–15.9)
LYMPH%: 54 % — AB (ref 14.0–49.7)
MCH: 27.3 pg (ref 25.1–34.0)
MCHC: 32.5 g/dL (ref 31.5–36.0)
MCV: 83.9 fL (ref 79.5–101.0)
MONO#: 0.3 10*3/uL (ref 0.1–0.9)
MONO%: 9.4 % (ref 0.0–14.0)
NEUT#: 0.9 10*3/uL — ABNORMAL LOW (ref 1.5–6.5)
NEUT%: 33.1 % — ABNORMAL LOW (ref 38.4–76.8)
PLATELETS: 203 10*3/uL (ref 145–400)
RBC: 3.71 10*6/uL (ref 3.70–5.45)
RDW: 13.7 % (ref 11.2–14.5)
WBC: 2.9 10*3/uL — ABNORMAL LOW (ref 3.9–10.3)
lymph#: 1.5 10*3/uL (ref 0.9–3.3)

## 2014-05-20 LAB — FERRITIN CHCC: Ferritin: 22 ng/ml (ref 9–269)

## 2014-05-20 LAB — IRON AND TIBC CHCC
%SAT: 11 % — ABNORMAL LOW (ref 21–57)
Iron: 32 ug/dL — ABNORMAL LOW (ref 41–142)
TIBC: 304 ug/dL (ref 236–444)
UIBC: 271 ug/dL (ref 120–384)

## 2014-05-20 NOTE — Telephone Encounter (Signed)
Gave adn pirnted appt sched and avs for pt for Sept

## 2014-05-20 NOTE — Progress Notes (Signed)
Hematology and Oncology Follow Up Visit  Amber [name] [id number] 04-Apr-1960 54 y.o. 05/20/2014 3:22 PM   Principle Diagnosis: 54 year old woman with iron deficiency anemia as well as fluctuating reactive leukocytopenia was diagnosed in April of 2014.  Prior Therapy: She is status post IV iron infusion in the form of Feraheme given on 05/05/2012, 10/2012 and 09/2013.    Current therapy: Oral iron supplements on a daily basis (325 mg by mouth 3 times a day).   Interim History: Amber [name] presents today for a followup visit. She reports she continues to have problems with very heavy menstrual cycles. She was recently seen by her gynecologist on 05/18/2014 and started on Megace. She has a follow-up appointment with her gynecologist on 06/02/2014. She has previously been treated with IV iron and tolerated this treatment without any complications. She presents today with repeat labs and reevaluation to see if further IV iron infusions are needed. She is tolerating her oral iron without any issues with constipation. She reports her energy level is fair at best. She continues to work full-time without any decline. 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 She has not reported any chest pain or shortness of breath. Has not reported any other bleeding such as GI bleeding or GU bleeding. She continues to work full-time and it tends to activities of daily living. He is not reporting any headaches or blurry vision or double vision. She has not reported any hematochezia or melena. He has not reported any recurrent infections. She does not report any constitutional symptoms of fevers chills or sweats. Has not reported any recent hospitalizations or illnesses. Remainder or view of system is unremarkable.  Medications: I have reviewed the patient's current medications.  Current Outpatient Prescriptions  Medication Sig Dispense Refill  . cholecalciferol (VITAMIN D) 400  UNITS TABS Take 400 Units by mouth at bedtime.     . ferrous sulfate 325 (65 FE) MG tablet Take 325 mg by mouth 3 (three) times daily with meals.     . hydroxychloroquine (PLAQUENIL) 200 MG tablet Take 200 mg by mouth 2 (two) times daily.     Marland Kitchen ibuprofen (ADVIL,MOTRIN) 800 MG tablet Take 800 mg by mouth every 8 (eight) hours as needed (For pain.).    Marland Kitchen lisinopril (PRINIVIL,ZESTRIL) 20 MG tablet Take 1 tablet (20 mg total) by mouth daily. 30 tablet 2  . megestrol (MEGACE) 40 MG tablet Take 1 tablet (40 mg total) by mouth 2 (two) times daily. 40 tablet 1  . metoprolol (LOPRESSOR) 50 MG tablet Take 1 tablet (50 mg total) by mouth 2 (two) times daily. 60 tablet 5  . potassium chloride SA (K-DUR,KLOR-CON) 20 MEQ tablet Take 20 mEq by mouth at bedtime.    . SUMAtriptan (IMITREX) 50 MG tablet Take 1 tablet (50 mg total) by mouth every 2 (two) hours as needed for migraine. May repeat in 2 hours if headache persists or recurs. 10 tablet 0  . terbinafine (LAMISIL) 250 MG tablet Take 250 mg by mouth daily.  0  . traMADol (ULTRAM) 50 MG tablet Take 1 tablet (50 mg total) by mouth every 6 (six) hours as needed. 24 tablet 0  . TRAVATAN Z 0.004 % SOLN ophthalmic solution Place 1 drop into both eyes At bedtime.     No current facility-administered medications for this visit.     Allergies: No Known Allergies    Physical Exam: Blood pressure 136/74, pulse 70, temperature 98.7 F (37.1 C), temperature source  Oral, resp. rate 18, height 5' 5.5" (1.664 m), weight 206 lb 6.4 oz (93.622 kg). ECOG: 0 General appearance: alert awake well-appearing woman. Head: Normocephalic.  Neck: no adenopathy or masses. Lymph nodes: Cervical, supraclavicular, and axillary nodes normal. Heart:regular rate and rhythm, S1, S2 normal, no murmur, click, rub or gallop Lung:chest clear, no wheezing, rales, normal symmetric air entry Abdomen: soft, non-tender, without masses or organomegaly EXT:no erythema, or edema.   Lab  Results: Lab Results  Component Value Date   WBC 2.9* 05/20/2014   HGB 10.1* 05/20/2014   HCT 31.1* 05/20/2014   MCV 83.9 05/20/2014   PLT 203 05/20/2014     Chemistry      Component Value Date/Time   NA 138 05/20/2014 0808   NA 136 11/25/2012 0846   K 3.9 05/20/2014 0808   K 4.3 11/25/2012 0846   CL 103 11/25/2012 0846   CL 108* 06/25/2012 1508   CO2 22 05/20/2014 0808   CO2 24 11/25/2012 0846   BUN 10.2 05/20/2014 0808   BUN 9 11/25/2012 0846   CREATININE 0.7 05/20/2014 0808   CREATININE 0.73 11/25/2012 0846      Component Value Date/Time   CALCIUM 8.7 05/20/2014 0808   CALCIUM 9.5 11/25/2012 0846   ALKPHOS 45 05/20/2014 0808   ALKPHOS 44 04/09/2014 0854   AST 18 05/20/2014 0808   AST 21 04/09/2014 0854   ALT 21 05/20/2014 0808   ALT 21 04/09/2014 0854   BILITOT 0.32 05/20/2014 0808   BILITOT 0.5 04/09/2014 0854       Impression and Plan:  54 year old woman with the following issues:  1. 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 Her hemoglobin is 10.1 today however her ferritin is 22 decreased from 78 5 months ago as well as a decrease in her iron saturation to 11% previously 14% and a reduced serum iron 32, previously 43.  She continues to have heavy menstrual bleeding and she is at risk of developing recurrent iron deficiency. We will arrange for her to receive Feraheme 510 mg IV then once weekly 2 doses. We need to continue to follow her iron stores and replaced them as needed.  2. Leukocytopenia: Bone marrow biopsy done on 09/09/2013 did not show any evidence of dysplasia or infiltrative bone marrow process. There is no evidence of any lymphoproliferative disease. Her leukocytopenia likely reactive in nature could be also related to autoimmune disease or related to iron deficiency. Her white cell count is again slightly low with some neutropenia and we will continue to monitor this and  subsequent visits.   3. Followup will be in 4 months.   [name] 5/19/20163:22 PM

## 2014-05-20 NOTE — Telephone Encounter (Signed)
Gave adn printed appt sched and avs for pt for Sept °

## 2014-05-21 ENCOUNTER — Ambulatory Visit: Payer: BC Managed Care – PPO | Admitting: Physician Assistant

## 2014-05-21 ENCOUNTER — Other Ambulatory Visit: Payer: Self-pay | Admitting: Physician Assistant

## 2014-05-21 ENCOUNTER — Other Ambulatory Visit: Payer: BC Managed Care – PPO

## 2014-05-21 MED ORDER — SODIUM CHLORIDE 0.9 % IV SOLN: 510.0000 mg | Freq: Once | INTRAVENOUS | Status: DC

## 2014-05-21 NOTE — Patient Instructions (Signed)
Return for IV iron is scheduled Continue to follow-up with your gynecologist as previously scheduled Follow-up in 4 months or sooner if needed

## 2014-05-24 ENCOUNTER — Telehealth: Payer: Self-pay | Admitting: Oncology

## 2014-05-24 NOTE — Telephone Encounter (Signed)
lvm for pt regarding to May and june appt.Marland KitchenMarland KitchenMarland Kitchen

## 2014-05-25 ENCOUNTER — Telehealth: Payer: Self-pay | Admitting: Gynecology

## 2014-05-25 ENCOUNTER — Other Ambulatory Visit: Payer: Self-pay | Admitting: Gynecology

## 2014-05-25 DIAGNOSIS — N950 Postmenopausal bleeding: Secondary | ICD-10-CM

## 2014-05-25 NOTE — Telephone Encounter (Signed)
05/25/14-I called the patient to let her know that her [facility] puts the cost of the sonohysterogram towards her $2500.00 deductible of which only $357.61 is met. The cost for the 60677,[id number] & [id number] is $851.58 plus the $35.00 copay is a total patient responsibility of $886.58. I told her we Can accept the $35.00 copay and a minimum of $100.00 towards the $851.58 to be put on a payment plan for balance. She will think about this and notify us if she wants to proceed by the end of this week.wl

## 2014-05-27 ENCOUNTER — Ambulatory Visit (HOSPITAL_BASED_OUTPATIENT_CLINIC_OR_DEPARTMENT_OTHER): Payer: BLUE CROSS/BLUE SHIELD

## 2014-05-27 VITALS — BP 131/67 | HR 81 | Temp 98.7°F | Resp 18

## 2014-05-27 DIAGNOSIS — D509 Iron deficiency anemia, unspecified: Secondary | ICD-10-CM | POA: Diagnosis not present

## 2014-05-27 DIAGNOSIS — D508 Other iron deficiency anemias: Secondary | ICD-10-CM

## 2014-05-27 DIAGNOSIS — D539 Nutritional anemia, unspecified: Secondary | ICD-10-CM

## 2014-05-27 DIAGNOSIS — D649 Anemia, unspecified: Secondary | ICD-10-CM

## 2014-05-27 DIAGNOSIS — D500 Iron deficiency anemia secondary to blood loss (chronic): Secondary | ICD-10-CM

## 2014-05-27 MED ORDER — SODIUM CHLORIDE 0.9 % IV SOLN
510.0000 mg | Freq: Once | INTRAVENOUS | Status: AC
  Administered 2014-05-27: 510 mg via INTRAVENOUS
  Filled 2014-05-27: qty 17

## 2014-05-27 MED ORDER — SODIUM CHLORIDE 0.9 % IV SOLN
Freq: Once | INTRAVENOUS | Status: AC
  Administered 2014-05-27: 09:00:00 via INTRAVENOUS

## 2014-05-27 NOTE — Patient Instructions (Signed)

## 2014-06-02 ENCOUNTER — Other Ambulatory Visit: Payer: Self-pay | Admitting: Gynecology

## 2014-06-02 ENCOUNTER — Encounter: Payer: Self-pay | Admitting: Gynecology

## 2014-06-02 ENCOUNTER — Ambulatory Visit (INDEPENDENT_AMBULATORY_CARE_PROVIDER_SITE_OTHER): Payer: BLUE CROSS/BLUE SHIELD | Admitting: Gynecology

## 2014-06-02 ENCOUNTER — Ambulatory Visit (INDEPENDENT_AMBULATORY_CARE_PROVIDER_SITE_OTHER): Payer: BLUE CROSS/BLUE SHIELD

## 2014-06-02 DIAGNOSIS — N852 Hypertrophy of uterus: Secondary | ICD-10-CM

## 2014-06-02 DIAGNOSIS — D500 Iron deficiency anemia secondary to blood loss (chronic): Secondary | ICD-10-CM | POA: Diagnosis not present

## 2014-06-02 DIAGNOSIS — Z86010 Personal history of colonic polyps: Secondary | ICD-10-CM

## 2014-06-02 DIAGNOSIS — D251 Intramural leiomyoma of uterus: Secondary | ICD-10-CM

## 2014-06-02 DIAGNOSIS — N939 Abnormal uterine and vaginal bleeding, unspecified: Secondary | ICD-10-CM | POA: Diagnosis not present

## 2014-06-02 DIAGNOSIS — N950 Postmenopausal bleeding: Secondary | ICD-10-CM

## 2014-06-02 MED ORDER — MEGESTROL ACETATE 40 MG PO TABS: 40.0000 mg | ORAL_TABLET | Freq: Two times a day (BID) | ORAL | Status: DC

## 2014-06-02 NOTE — Patient Instructions (Signed)
Abdominal Hysterectomy Abdominal hysterectomy is a surgical procedure to remove your womb (uterus). Your uterus is the muscular organ that contains a developing baby. This surgery is done for many reasons. You may need an abdominal hysterectomy if you have cancer, growths (tumors), long-term pain, or bleeding. You may also have this procedure if your uterus has slipped down into your vagina (uterine prolapse). Depending on why you need an abdominal hysterectomy, you may also have other reproductive organs removed. These could include the part of your vagina that connects with your uterus (cervix), the organs that make eggs (ovaries), and the tubes that connect the ovaries to the uterus (fallopian tubes). LET [facility] CARE PROVIDER KNOW ABOUT:   Any allergies you have.  All medicines you are taking, including vitamins, herbs, eye drops, creams, and over-the-counter medicines.  Previous problems you or members of your family have had with the use of anesthetics.  Any blood disorders you have.  Previous surgeries you have had.  Medical conditions you have. RISKS AND COMPLICATIONS Generally, this is a safe procedure. However, as with any procedure, problems can occur. Infection is the most common problem after an abdominal hysterectomy. Other possible problems include:  Bleeding.  Formation of blood clots that may break free and travel to your lungs.  Injury to other organs near your uterus.  Nerve injury causing nerve pain.  Decreased interest in sex or pain during sexual intercourse. BEFORE THE PROCEDURE  Abdominal hysterectomy is a major surgical procedure. It can affect the way you feel about yourself. Talk to your health care provider about the physical and emotional changes hysterectomy may cause.  You may need to have blood work and X-rays done before surgery.  Quit smoking if you smoke. Ask your health care provider for help if you are struggling to quit.  Stop taking  medicines that thin your blood as directed by your health care provider.  You may be instructed to take antibiotic medicines or laxatives before surgery.  Do not eat or drink anything for 6-8 hours before surgery.  Take your regular medicines with a small sip of water.  Bathe or shower the night or morning before surgery. PROCEDURE  Abdominal hysterectomy is done in the operating room at the hospital.  In most cases, you will be given a medicine that makes you go to sleep (general anesthetic).  The surgeon will make a cut (incision) through the skin in your lower belly.  The incision may be about 5-7 inches long. It may go side-to-side or up-and-down.  The surgeon will move aside the body tissue that covers your uterus. The surgeon will then carefully take out your uterus along with any of your other reproductive organs that need to be removed.  Bleeding will be controlled with clamps or sutures.  The surgeon will close your incision with sutures or metal clips. AFTER THE PROCEDURE  You will have some pain immediately after the procedure.  You will be given pain medicine in the recovery room.  You will be taken to your hospital room when you have recovered from the anesthesia.  You may need to stay in the hospital for 2-5 days.  You will be given instructions for recovery at home. Document Released: 12/23/2012 Document Reviewed: 12/23/2012 [facility] Patient Information 2015 [place], [place]. This information is not intended to replace advice given to you by your health care provider. Make sure you discuss any questions you have with your health care provider.

## 2014-06-02 NOTE — Progress Notes (Signed)
   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 Patient is also been followed by the hematologist. Patient was menstruating until January 2016. Patient not sexually active. Patient for several years has been dealing with Essure of menorrhagia. She has had on deficiency anemia and fluctuating reactive leukocytopenia which was diagnosed in 2014. Patient received IV iron infusion in 2014, 2015 and again last month. Patient is currently taking iron supplementation twice a day. She also has had a normal bone marrow aspiration study in the past. She had a normal colonoscopy at the age of 19. Past GYN history as follows:  1 spontaneous AB  1 elective AB  1 right ectopic resulted in salpingostomy (1994)  Laparoscopic attempt of removing ovarian cyst by another physician in [place] resulted in an incidental enterotomy requiring laparotomy and repair.  An endometrial biopsy was done at last office visit on May 17 with pathology report as follows: Diagnosis Endometrium, biopsy - SECRETORY ENDOMETRIUM, BENIGN ENDOCERVIX AND ABUNDANT BLOOD. NO HYPERPLASIA OR CARCINOMA.  Patient today underwent a sonohysterogram with the following noted: Uterus measured 13.5 x 9.2 x 9.0 cm with endometrial stripe of 6.0 mm. Patient with several fibroids the largest one measuring 5.0 x 4.7 cm slightly displacing the intrauterine cavity. Right and left ovary were otherwise normal. The cervix was then cleansed with Betadine solution and sterile catheter was introduced into the uterine cavity and normal saline was instilled. No intracavitary defect was noted.  Assessment/plan: Patient with leiomyomatous uteri long-standing history of menorrhagia now menopausal with benign endometrial biopsy. Patient with prior history of incidental enterotomy by another provider during time of removal ovarian cyst requiring midline incision and  exploratory laparotomy and repair of enterotomy. We discussed the recommendation to proceed with a hysterectomy as well as with removal bilateral tubes and ovary. Patient also with long-standing history of anemia will discuss with her hematologist but the possibility of iron infusion if needed before her surgery and also in the interval start her on Lupron 11.25 mg for 3 months to see if we can get her hemoglobin up to a safe value before proceeding with an abdominal hysterectomy and bilateral salpingo-oophorectomy. I will send her hematologist at note and we'll try to coordinate all the above in the next few weeks. We would then plan for surgery in about 3 months. She is taking Megace 40 mg twice a day for 10 days which has stopped her bleeding.

## 2014-06-03 ENCOUNTER — Telehealth: Payer: Self-pay

## 2014-06-03 ENCOUNTER — Encounter: Payer: Self-pay | Admitting: *Deleted

## 2014-06-03 ENCOUNTER — Ambulatory Visit (HOSPITAL_BASED_OUTPATIENT_CLINIC_OR_DEPARTMENT_OTHER): Payer: BLUE CROSS/BLUE SHIELD

## 2014-06-03 ENCOUNTER — Other Ambulatory Visit: Payer: Self-pay | Admitting: Gynecology

## 2014-06-03 VITALS — BP 132/77 | HR 78 | Temp 98.5°F | Resp 18

## 2014-06-03 DIAGNOSIS — D509 Iron deficiency anemia, unspecified: Secondary | ICD-10-CM | POA: Diagnosis not present

## 2014-06-03 DIAGNOSIS — D500 Iron deficiency anemia secondary to blood loss (chronic): Secondary | ICD-10-CM

## 2014-06-03 MED ORDER — SODIUM CHLORIDE 0.9 % IV SOLN
510.0000 mg | Freq: Once | INTRAVENOUS | Status: AC
  Administered 2014-06-03: 510 mg via INTRAVENOUS
  Filled 2014-06-03: qty 17

## 2014-06-03 NOTE — Telephone Encounter (Signed)
I called patient to schedule TAH,BSO.  I offered her June 28th.  She said she could not do that because her birthday is the next day. I explained to her that this is the only time I have available in the 4-6 weeks time frame that Dr. JF/hematologist recommended. I explained to her that her hematologist recommended the 4-6 week window as after that time her iron will be dropping again and she may have to be reinfused.  She is fine with that. She said there is no way she can do surgery that soon. She needs her sister to come help her and she will need more notice to arrange that.  My first available after that was August 9 and she was very pleased with that date.  We scheduled her a consult the week before with CBC.

## 2014-06-03 NOTE — Patient Instructions (Signed)

## 2014-06-03 NOTE — Progress Notes (Signed)
Patient observed for 30 minutes post feraheme infusion.

## 2014-06-03 NOTE — Telephone Encounter (Signed)
[place]. Please relate the surgical date to her hematologist as well.

## 2014-06-10 NOTE — Telephone Encounter (Addendum)
Patient called back and was able to work out having her surgery 06/29/14 so I scheduled her then. That is within the 4-6 week window that the hematologist recommended.

## 2014-06-21 ENCOUNTER — Encounter: Payer: Self-pay | Admitting: Gynecology

## 2014-06-21 ENCOUNTER — Ambulatory Visit (INDEPENDENT_AMBULATORY_CARE_PROVIDER_SITE_OTHER): Payer: BLUE CROSS/BLUE SHIELD | Admitting: Gynecology

## 2014-06-21 VITALS — BP 128/80

## 2014-06-21 DIAGNOSIS — D251 Intramural leiomyoma of uterus: Secondary | ICD-10-CM | POA: Diagnosis not present

## 2014-06-21 DIAGNOSIS — D500 Iron deficiency anemia secondary to blood loss (chronic): Secondary | ICD-10-CM

## 2014-06-21 DIAGNOSIS — Z01818 Encounter for other preprocedural examination: Secondary | ICD-10-CM | POA: Diagnosis not present

## 2014-06-21 LAB — CBC WITH DIFFERENTIAL/PLATELET
Basophils Absolute: 0 10*3/uL (ref 0.0–0.1)
Basophils Relative: 0 % (ref 0–1)
Eosinophils Absolute: 0.1 10*3/uL (ref 0.0–0.7)
Eosinophils Relative: 2 % (ref 0–5)
HCT: 34.6 % — ABNORMAL LOW (ref 36.0–46.0)
HEMOGLOBIN: 10.9 g/dL — AB (ref 12.0–15.0)
LYMPHS ABS: 2.2 10*3/uL (ref 0.7–4.0)
LYMPHS PCT: 60 % — AB (ref 12–46)
MCH: 27.4 pg (ref 26.0–34.0)
MCHC: 31.5 g/dL (ref 30.0–36.0)
MCV: 86.9 fL (ref 78.0–100.0)
MPV: 9.8 fL (ref 8.6–12.4)
Monocytes Absolute: 0.3 10*3/uL (ref 0.1–1.0)
Monocytes Relative: 7 % (ref 3–12)
Neutro Abs: 1.1 10*3/uL — ABNORMAL LOW (ref 1.7–7.7)
Neutrophils Relative %: 31 % — ABNORMAL LOW (ref 43–77)
Platelets: 242 10*3/uL (ref 150–400)
RBC: 3.98 MIL/uL (ref 3.87–5.11)
RDW: 15.1 % (ref 11.5–15.5)
WBC: 3.6 10*3/uL — ABNORMAL LOW (ref 4.0–10.5)

## 2014-06-21 MED ORDER — OXYCODONE-ACETAMINOPHEN 5-325 MG PO TABS: 1.0000 | ORAL_TABLET | ORAL | Status: DC | PRN

## 2014-06-21 MED ORDER — METOCLOPRAMIDE HCL 10 MG PO TABS: 10.0000 mg | ORAL_TABLET | Freq: Three times a day (TID) | ORAL | Status: DC

## 2014-06-21 NOTE — Progress Notes (Signed)
Amber [name] is an 54 y.o. female. For preoperative consultation and examination as a result of her leiomyomatous uteri contributing to her iron deficiency anemia.Patient is also been followed by the hematologist. Patient was menstruating until January 2016. Patient not sexually active. Patient for several years has been dealing with issues relating to her menorrhagia.Patient was menstruating until January 2016. Patient not sexually active. Patient for several years has been dealing with Essure of menorrhagia. She has had on deficiency anemia and fluctuating reactive leukocytopenia which was diagnosed in 2014. Patient received IV iron infusion in 2014, 2015 and again last month. Patient is currently taking iron supplementation twice a day. She also has had a normal bone marrow aspiration study in the past. She had a normal colonoscopy at the age of 84. Past GYN history as follows:  1 spontaneous AB  1 elective AB  1 right ectopic resulted in salpingostomy (1994)  Laparoscopic attempt of removing ovarian cyst by another physician in [place] resulted in an incidental enterotomy requiring laparotomy and repair.  An endometrial biopsy was done at last office visit on May 17 with pathology report as follows: Diagnosis Endometrium, biopsy - SECRETORY ENDOMETRIUM, BENIGN ENDOCERVIX AND ABUNDANT BLOOD. NO HYPERPLASIA OR CARCINOMA.  Patient had an ultrasound and sonohysterogram on 06/02/2014 the following was noted: Uterus measured 13.5 x 9.2 x 9.0 cm with endometrial stripe of 6.0 mm. Patient with several fibroids the largest one measuring 5.0 x 4.7 cm slightly displacing the intrauterine cavity. Right and left ovary were otherwise normal. The cervix was then cleansed with Betadine solution and sterile catheter was introduced into the uterine cavity and normal saline was instilled. No intracavitary defect was noted.  Pertinent Gynecological History: Menses: Heavy for 5 days a  month Bleeding: see above Contraception: none DES exposure: unknown Blood transfusions: none Sexually transmitted diseases: no past history Previous GYN Procedures: see below  Last mammogram: normal Date: 2016 Last pap: normal Date:2016 OB History [id number]   Menstrual History: Menarche age: 30  Patient's last menstrual period was 05/13/2014.    Past Medical History  Diagnosis Date  . SAB (spontaneous abortion)   . Elective abortion   . Ectopic pregnancy     RIGHT salpingostomy  . Hypertension   . GERD (gastroesophageal reflux disease)   . Anemia   . Sjogren's disease   . Glaucoma     both eyes  . Rheumatoid arthritis(714.0) 11/07/2011  . History of chicken pox     Past Surgical History  Procedure Laterality Date  . Dilation and curettage of uterus      X 2  . Knee surgery      MENISCUS TEAR REPAIR  . Pelvic laparoscopy  1994    IN 1999 LAPAROSCOPY WITH INCIDENTAL ENTEROTOMY REQUIRING LAPAROTOMY  . Abdominal surgery      bowel repair    Family History  Problem Relation Age of Onset  . Hypertension Mother     died from heart disease  . Heart disease Mother     deceased, unknown cause  . Diabetes Sister   . Hypertension Sister   . Diabetes Sister     Social History:  reports that she has never smoked. She has never used smokeless tobacco. She reports that she does not drink alcohol or use illicit drugs.  Allergies: No Known Allergies   (Not in a hospital admission)  REVIEW OF SYSTEMS: A ROS was performed and pertinent positives and negatives are included in the history.  GENERAL: No fevers or  chills. HEENT: No change in vision, no earache, sore throat or sinus congestion. NECK: No pain or stiffness. CARDIOVASCULAR: No chest pain or pressure. No palpitations. PULMONARY: No shortness of breath, cough or wheeze. GASTROINTESTINAL: No abdominal pain, nausea, vomiting or diarrhea, melena or bright red blood per rectum. GENITOURINARY: No urinary frequency, urgency,  hesitancy or dysuria. MUSCULOSKELETAL: No joint or muscle pain, no back pain, no recent trauma. DERMATOLOGIC: No rash, no itching, no lesions. ENDOCRINE: No polyuria, polydipsia, no heat or cold intolerance. No recent change in weight. HEMATOLOGICAL: No anemia or easy bruising or bleeding. NEUROLOGIC: No headache, seizures, numbness, tingling or weakness. PSYCHIATRIC: No depression, no loss of interest in normal activity or change in sleep pattern.     Blood pressure 128/80, last menstrual period 05/13/2014.  Physical Exam:  HEENT:unremarkable Neck:Supple, midline, no thyroid megaly, no carotid bruits Lungs:  Clear to auscultation no rhonchi's or wheezes Heart:Regular rate and rhythm, no murmurs or gallops Breast Exam: Examined recently normal Abdomen: Soft nontender midline scar noted Pelvic:BUS within normal limits Vagina: No lesions or discharge Cervix: No lesions or discharge Uterus: Irregular shaped 14 week size Adnexa: No masses no tenderness Extremities: No cords, no edema Rectal: Not examined  No results found for this or any previous visit (from the past 24 hour(s)).  No results found.  Assessment/Plan:Patient with leiomyomatous uteri long-standing history of menorrhagia now menopausal with benign endometrial biopsy. Patient with prior history of incidental enterotomy by another provider during time of removal ovarian cyst requiring midline incision and exploratory laparotomy and repair of enterotomy. We discussed the recommendation to proceed with a hysterectomy as well as with removal bilateral tubes and ovary. Patient also with long-standing history of anemia requiring iron infusion on several occasions the last one in May. We will check her CBC today. We are going to schedule her for total abdominal hysterectomy with bilateral salpingo-oophorectomy. She had been taking Megace 40 mg twice a day to stop her bleeding. She was not demonstrating today. The following risk of surgery  were discussed with the patient:                        Patient was counseled as to the risk of surgery to include the following:  1. Infection (prohylactic antibiotics will be administered)  2. DVT/Pulmonary Embolism (prophylactic pneumo compression stockings will be used)  3.Trauma to internal organs requiring additional surgical procedure to repair any injury to     Internal organs requiring perhaps additional hospitalization days.  4.Hemmorhage requiring transfusion and blood products which carry risks such as             anaphylactic reaction, hepatitis and AIDS  Patient had received literature information on the procedure scheduled and all her questions were answered and fully accepts all risk.  Patient will continue to take her iron tablet twice a day until her surgery.  [facility] HMD5:35 PMTD

## 2014-06-21 NOTE — Patient Instructions (Signed)
Abdominal Hysterectomy Abdominal hysterectomy is a surgical procedure to remove your womb (uterus). Your uterus is the muscular organ that contains a developing baby. This surgery is done for many reasons. You may need an abdominal hysterectomy if you have cancer, growths (tumors), long-term pain, or bleeding. You may also have this procedure if your uterus has slipped down into your vagina (uterine prolapse). Depending on why you need an abdominal hysterectomy, you may also have other reproductive organs removed. These could include the part of your vagina that connects with your uterus (cervix), the organs that make eggs (ovaries), and the tubes that connect the ovaries to the uterus (fallopian tubes). LET [facility] CARE PROVIDER KNOW ABOUT:   Any allergies you have.  All medicines you are taking, including vitamins, herbs, eye drops, creams, and over-the-counter medicines.  Previous problems you or members of your family have had with the use of anesthetics.  Any blood disorders you have.  Previous surgeries you have had.  Medical conditions you have. RISKS AND COMPLICATIONS Generally, this is a safe procedure. However, as with any procedure, problems can occur. Infection is the most common problem after an abdominal hysterectomy. Other possible problems include:  Bleeding.  Formation of blood clots that may break free and travel to your lungs.  Injury to other organs near your uterus.  Nerve injury causing nerve pain.  Decreased interest in sex or pain during sexual intercourse. BEFORE THE PROCEDURE  Abdominal hysterectomy is a major surgical procedure. It can affect the way you feel about yourself. Talk to your health care provider about the physical and emotional changes hysterectomy may cause.  You may need to have blood work and X-rays done before surgery.  Quit smoking if you smoke. Ask your health care provider for help if you are struggling to quit.  Stop taking  medicines that thin your blood as directed by your health care provider.  You may be instructed to take antibiotic medicines or laxatives before surgery.  Do not eat or drink anything for 6-8 hours before surgery.  Take your regular medicines with a small sip of water.  Bathe or shower the night or morning before surgery. PROCEDURE  Abdominal hysterectomy is done in the operating room at the hospital.  In most cases, you will be given a medicine that makes you go to sleep (general anesthetic).  The surgeon will make a cut (incision) through the skin in your lower belly.  The incision may be about 5-7 inches long. It may go side-to-side or up-and-down.  The surgeon will move aside the body tissue that covers your uterus. The surgeon will then carefully take out your uterus along with any of your other reproductive organs that need to be removed.  Bleeding will be controlled with clamps or sutures.  The surgeon will close your incision with sutures or metal clips. AFTER THE PROCEDURE  You will have some pain immediately after the procedure.  You will be given pain medicine in the recovery room.  You will be taken to your hospital room when you have recovered from the anesthesia.  You may need to stay in the hospital for 2-5 days.  You will be given instructions for recovery at home. Document Released: 12/23/2012 Document Reviewed: 12/23/2012 [facility] Patient Information 2015 [place], [place]. This information is not intended to replace advice given to you by your health care provider. Make sure you discuss any questions you have with your health care provider.

## 2014-06-22 ENCOUNTER — Encounter: Payer: Self-pay | Admitting: Gynecology

## 2014-06-24 ENCOUNTER — Encounter (HOSPITAL_COMMUNITY)
Admission: RE | Admit: 2014-06-24 | Discharge: 2014-06-24 | Disposition: A | Discharge status: Home / Self Care | Payer: BLUE CROSS/BLUE SHIELD | Source: Ambulatory Visit | Attending: Gynecology | Admitting: Gynecology

## 2014-06-24 ENCOUNTER — Encounter (HOSPITAL_COMMUNITY): Payer: Self-pay

## 2014-06-24 ENCOUNTER — Other Ambulatory Visit: Payer: Self-pay

## 2014-06-24 DIAGNOSIS — Z01818 Encounter for other preprocedural examination: Secondary | ICD-10-CM | POA: Insufficient documentation

## 2014-06-24 LAB — URINALYSIS, ROUTINE W REFLEX MICROSCOPIC
Bilirubin Urine: NEGATIVE
Glucose, UA: NEGATIVE mg/dL
Hgb urine dipstick: NEGATIVE
Ketones, ur: NEGATIVE mg/dL
LEUKOCYTES UA: NEGATIVE
Nitrite: NEGATIVE
Protein, ur: NEGATIVE mg/dL
Specific Gravity, Urine: 1.02 (ref 1.005–1.030)
Urobilinogen, UA: 0.2 mg/dL (ref 0.0–1.0)
pH: 6 (ref 5.0–8.0)

## 2014-06-24 LAB — CBC
HCT: 32.4 % — ABNORMAL LOW (ref 36.0–46.0)
HEMOGLOBIN: 10.7 g/dL — AB (ref 12.0–15.0)
MCH: 28.5 pg (ref 26.0–34.0)
MCHC: 33 g/dL (ref 30.0–36.0)
MCV: 86.4 fL (ref 78.0–100.0)
PLATELETS: 232 10*3/uL (ref 150–400)
RBC: 3.75 MIL/uL — ABNORMAL LOW (ref 3.87–5.11)
RDW: 15.5 % (ref 11.5–15.5)
WBC: 3.2 10*3/uL — AB (ref 4.0–10.5)

## 2014-06-24 LAB — ABO/RH: ABO/RH(D): B POS

## 2014-06-24 LAB — TYPE AND SCREEN
ABO/RH(D): B POS
Antibody Screen: NEGATIVE

## 2014-06-24 NOTE — Patient Instructions (Addendum)
Your procedure is scheduled on:  June 29, 2014  Enter through the Main Entrance of [facility] at:  0800 am   Pick up the phone at the desk and dial [phone].  Call this number if you have problems the morning of surgery: [phone].  Remember: Do NOT eat food:  After midnight on Monday  Do NOT drink clear liquids after:  Midnight on Monday  Take these medicines the morning of surgery with a SIP OF WATER:  Lopressor and Lisinopril, Plaquenil   Do NOT wear jewelry (body piercing), metal hair clips/bobby pins, make-up, or nail polish. Do NOT wear lotions, powders, or perfumes.  You may wear deoderant. Do NOT shave for 48 hours prior to surgery. Do NOT bring valuables to the hospital. Contacts, dentures, or bridgework may not be worn into surgery. Leave suitcase in car.  After surgery it may be brought to your room.  For patients admitted to the hospital, checkout time is 11:00 AM the day of discharge.

## 2014-06-25 ENCOUNTER — Telehealth: Payer: Self-pay

## 2014-06-25 NOTE — Telephone Encounter (Signed)
Patient called back in voice mail and told me that when she went for preadmitting appt at the hospital yesterday that she found out that she had to make a very large prepayment to the hospital as well. She said too much is going on right now and she just cannot do this. She asked me to cancel her surgery for 06/29/14.  I called her back but went to voice mail. I told her I would take care of cancelling.

## 2014-06-25 NOTE — Telephone Encounter (Signed)
Patient called in voice mail stating she had a question regarding her upcoming surgery.  I called her back and left message in her voice mail for her to call me.

## 2014-06-29 ENCOUNTER — Inpatient Hospital Stay (HOSPITAL_COMMUNITY): Admission: RE | Admit: 2014-06-29 | Payer: BLUE CROSS/BLUE SHIELD | Source: Ambulatory Visit | Admitting: Gynecology

## 2014-06-29 ENCOUNTER — Encounter (HOSPITAL_COMMUNITY): Admission: RE | Payer: Self-pay | Source: Ambulatory Visit

## 2014-06-29 SURGERY — HYSTERECTOMY, ABDOMINAL
Anesthesia: General

## 2014-07-14 ENCOUNTER — Ambulatory Visit: Payer: BLUE CROSS/BLUE SHIELD | Admitting: Gynecology

## 2014-07-20 ENCOUNTER — Other Ambulatory Visit: Payer: Self-pay | Admitting: Obstetrics & Gynecology

## 2014-07-20 ENCOUNTER — Telehealth: Payer: Self-pay | Admitting: *Deleted

## 2014-07-20 NOTE — Telephone Encounter (Signed)
Received letter requesting medical clearance and forwarded to PCP for recommendation. Pt last seen by PCP 04/2014 but appears to have had pre-op labs and EKG 06/24/14.  Please advise if appointment is needed with [place] for clearance as surgery is scheduled for 07/27/14?

## 2014-07-20 NOTE — Telephone Encounter (Signed)
Received call from [place] at [place] [phone]) requesting surgical clearance for pt's upcoming myomectomy. She will fax clearance form to [place]. Awaiting form.

## 2014-07-21 ENCOUNTER — Ambulatory Visit (INDEPENDENT_AMBULATORY_CARE_PROVIDER_SITE_OTHER): Payer: BLUE CROSS/BLUE SHIELD | Admitting: Family

## 2014-07-21 ENCOUNTER — Encounter: Payer: Self-pay | Admitting: Family

## 2014-07-21 VITALS — BP 124/84 | HR 60 | Temp 98.3°F | Resp 18 | Ht 65.5 in | Wt 199.0 lb

## 2014-07-21 DIAGNOSIS — Z01818 Encounter for other preprocedural examination: Secondary | ICD-10-CM | POA: Diagnosis not present

## 2014-07-21 LAB — CBC WITH DIFFERENTIAL/PLATELET
BASOS ABS: 0 10*3/uL (ref 0.0–0.1)
BASOS PCT: 0.6 % (ref 0.0–3.0)
EOS ABS: 0 10*3/uL (ref 0.0–0.7)
EOS PCT: 1.7 % (ref 0.0–5.0)
HCT: 37.4 % (ref 36.0–46.0)
HEMOGLOBIN: 12.6 g/dL (ref 12.0–15.0)
Lymphocytes Relative: 61.4 % — ABNORMAL HIGH (ref 12.0–46.0)
Lymphs Abs: 1.6 10*3/uL (ref 0.7–4.0)
MCHC: 33.7 g/dL (ref 30.0–36.0)
MCV: 85.8 fl (ref 78.0–100.0)
MONO ABS: 0.2 10*3/uL (ref 0.1–1.0)
Monocytes Relative: 9.2 % (ref 3.0–12.0)
NEUTROS ABS: 0.7 10*3/uL — AB (ref 1.4–7.7)
Neutrophils Relative %: 27.1 % — ABNORMAL LOW (ref 43.0–77.0)
PLATELETS: 225 10*3/uL (ref 150.0–400.0)
RBC: 4.36 Mil/uL (ref 3.87–5.11)
RDW: 14.3 % (ref 11.5–15.5)
WBC: 2.6 10*3/uL — ABNORMAL LOW (ref 4.0–10.5)

## 2014-07-21 LAB — BASIC METABOLIC PANEL
BUN: 12 mg/dL (ref 6–23)
CO2: 27 mEq/L (ref 19–32)
Calcium: 9.6 mg/dL (ref 8.4–10.5)
Chloride: 106 mEq/L (ref 96–112)
Creatinine, Ser: 0.68 mg/dL (ref 0.40–1.20)
GFR: 116.38 mL/min (ref >60.00)
Glucose, Bld: 88 mg/dL (ref 70–99)
POTASSIUM: 3.8 meq/L (ref 3.5–5.1)
Sodium: 137 mEq/L (ref 135–145)

## 2014-07-21 LAB — HEPATIC FUNCTION PANEL
ALT: 15 U/L (ref 0–35)
AST: 16 U/L (ref 0–37)
Albumin: 3.7 g/dL (ref 3.5–5.2)
Alkaline Phosphatase: 43 U/L (ref 39–117)
Bilirubin, Direct: 0.1 mg/dL (ref 0.0–0.3)
Total Bilirubin: 0.5 mg/dL (ref 0.2–1.2)
Total Protein: 8.5 g/dL — ABNORMAL HIGH (ref 6.0–8.3)

## 2014-07-21 LAB — HCG, SERUM, QUALITATIVE: Preg, Serum: NEGATIVE

## 2014-07-21 NOTE — Telephone Encounter (Signed)
Yes, I should see her first please.

## 2014-07-21 NOTE — Progress Notes (Addendum)
Subjective:    Patient ID: Amber [name], female    DOB: 1960/04/26, 54 y.o.   MRN: [id number]  HPI  Amber [name] is a 54 yr old female who presents today for pre-operative clearance.   She is currently scheduled for Abdominal Myomectomy on 07/27/14.  She did undergo pre-operative labs which are reviewed.  Initially she was scheduled with Dr. Toney Rakes for [place], but she decided to switch providers and is now established with [facility]. She reports that she continues to have irregular/heavy bleeding.    Review of Systems  Constitutional: Negative for unexpected weight change.  HENT: Negative for rhinorrhea.   Respiratory: Negative for cough and shortness of breath.   Cardiovascular: Negative for chest pain.  Gastrointestinal: Negative for diarrhea and constipation.  Genitourinary: Positive for menstrual problem. Negative for dysuria and frequency.  Musculoskeletal: Negative for myalgias.       Chronic left hip pain and hand pain.   Skin: Negative for rash.  Neurological:       Occasional headaches  Hematological: Negative for adenopathy.  Psychiatric/Behavioral:       Denies depression/anxiety   See HPI  Past Medical History  Diagnosis Date  . SAB (spontaneous abortion)   . Elective abortion   . Ectopic pregnancy     RIGHT salpingostomy  . Hypertension   . GERD (gastroesophageal reflux disease)   . Anemia   . Sjogren's disease   . Glaucoma     both eyes  . Rheumatoid arthritis(714.0) 11/07/2011  . History of chicken pox     History   Social History  . Marital Status: Single    Spouse Name: N/A  . Number of Children: 0  . Years of Education: N/A   Occupational History  .     Social History Main Topics  . Smoking status: Never Smoker   . Smokeless tobacco: Never Used  . Alcohol Use: No  . Drug Use: No  . Sexual Activity: Not on file     Comment: 1st intercourse- 17, partners- 5   Other Topics Concern  . Not on file   Social History  Narrative   Regular exercise:  No   Caffeine Use: 2 cups coffee daily   No children   "Happily divorced"   Reports that she is involved at her church   Works with intellectually challenged adults   Born in [place]- she came to [place] as adult.  Age 60.              Past Surgical History  Procedure Laterality Date  . Dilation and curettage of uterus      X 2  . Knee surgery      MENISCUS TEAR REPAIR  . Pelvic laparoscopy  1994    IN 1999 LAPAROSCOPY WITH INCIDENTAL ENTEROTOMY REQUIRING LAPAROTOMY  . Abdominal surgery      bowel repair    Family History  Problem Relation Age of Onset  . Hypertension Mother     died from heart disease  . Heart disease Mother     deceased, unknown cause  . Diabetes Sister   . Hypertension Sister   . Diabetes Sister     No Known Allergies  Current Outpatient Prescriptions on File Prior to Visit  Medication Sig Dispense Refill  . cholecalciferol (VITAMIN D) 400 UNITS TABS Take 400 Units by mouth at bedtime.     . ferrous sulfate 325 (65 FE) MG tablet Take 325 mg by mouth 3 (three) times  daily with meals.     . hydroxychloroquine (PLAQUENIL) 200 MG tablet Take 200 mg by mouth 2 (two) times daily.     Marland Kitchen ibuprofen (ADVIL,MOTRIN) 800 MG tablet Take 800 mg by mouth every 8 (eight) hours as needed (For pain.).    Marland Kitchen lisinopril (PRINIVIL,ZESTRIL) 20 MG tablet Take 1 tablet (20 mg total) by mouth daily. 30 tablet 2  . metoprolol (LOPRESSOR) 50 MG tablet Take 1 tablet (50 mg total) by mouth 2 (two) times daily. 60 tablet 5  . naproxen sodium (ANAPROX) 220 MG tablet Take 440 mg by mouth daily as needed (For pain.).    Marland Kitchen oxyCODONE-acetaminophen (PERCOCET) 5-325 MG per tablet Take 1 tablet by mouth every 4 (four) hours as needed for severe pain. 30 tablet 0  . potassium chloride SA (K-DUR,KLOR-CON) 20 MEQ tablet Take 20 mEq by mouth at bedtime.    . SUMAtriptan (IMITREX) 50 MG tablet Take 1 tablet (50 mg total) by mouth every 2 (two) hours as needed for  migraine. May repeat in 2 hours if headache persists or recurs. 10 tablet 0  . traMADol (ULTRAM) 50 MG tablet Take 1 tablet (50 mg total) by mouth every 6 (six) hours as needed. 24 tablet 0  . TRAVATAN Z 0.004 % SOLN ophthalmic solution Place 1 drop into both eyes At bedtime.     No current facility-administered medications on file prior to visit.    BP 124/84 mmHg  Pulse 60  Temp(Src) 98.3 F (36.8 C) (Oral)  Resp 18  Ht 5' 5.5" (1.664 m)  Wt 199 lb (90.266 kg)  BMI 32.60 kg/m2  SpO2 98%       Objective:   Physical Exam  Constitutional: She is oriented to person, place, and time. She appears well-developed and well-nourished. No distress.  HENT:  Head: Normocephalic and atraumatic.  Eyes: No scleral icterus.  Neck: No thyromegaly present.  Cardiovascular: Normal rate and regular rhythm.   No murmur heard. Pulmonary/Chest: Effort normal and breath sounds normal. No respiratory distress. She has no wheezes. She has no rales. She exhibits no tenderness.  Abdominal: Soft. Bowel sounds are normal. She exhibits no distension. There is no tenderness.  Musculoskeletal:  1+ bilateral LE edema  Lymphadenopathy:    She has no cervical adenopathy.  Neurological: She is alert and oriented to person, place, and time.  Skin: Skin is warm and dry.  Psychiatric: She has a normal mood and affect. Her behavior is normal. Judgment and thought content normal.             Assessment & Plan:          Assessment & Plan:  Pre-operative clearance- reviewed her lab work performed on 06/24/14.  Notes Hgb 10.7 and depressed WBC (both at baseline). Urinalysis is normal.  Will obtain cmet and recent cbc since lab will be > 72 month old at time of surgery.  Had EKG on 06/24/14 which is reviewed and is normal.    Addendum:  Pt is medically cleared to proceed with surgery.

## 2014-07-21 NOTE — Progress Notes (Signed)
Pre visit review using our clinic review tool, if applicable. No additional management support is needed unless otherwise documented below in the visit note. 

## 2014-07-21 NOTE — Telephone Encounter (Signed)
Scheduled pt for today at 9:45am and she is aware.

## 2014-07-21 NOTE — Patient Instructions (Signed)
Please complete lab work prior to leaving. We will fax surgical clearance to your GYN's office as soon as it is complete.

## 2014-07-23 ENCOUNTER — Encounter (HOSPITAL_COMMUNITY)
Admission: RE | Admit: 2014-07-23 | Discharge: 2014-07-23 | Disposition: A | Discharge status: Home / Self Care | Payer: BLUE CROSS/BLUE SHIELD | Source: Ambulatory Visit | Attending: Obstetrics & Gynecology | Admitting: Obstetrics & Gynecology

## 2014-07-23 ENCOUNTER — Encounter (HOSPITAL_COMMUNITY): Payer: Self-pay

## 2014-07-23 ENCOUNTER — Encounter: Payer: Self-pay | Admitting: Family

## 2014-07-23 DIAGNOSIS — Z01818 Encounter for other preprocedural examination: Secondary | ICD-10-CM | POA: Insufficient documentation

## 2014-07-23 DIAGNOSIS — N920 Excessive and frequent menstruation with regular cycle: Secondary | ICD-10-CM | POA: Diagnosis not present

## 2014-07-23 DIAGNOSIS — D259 Leiomyoma of uterus, unspecified: Secondary | ICD-10-CM | POA: Diagnosis not present

## 2014-07-23 HISTORY — DX: Headache, unspecified: R51.9

## 2014-07-23 HISTORY — DX: Headache: R51

## 2014-07-23 LAB — CBC
HEMATOCRIT: 35.9 % — AB (ref 36.0–46.0)
HEMOGLOBIN: 11.8 g/dL — AB (ref 12.0–15.0)
MCH: 28.8 pg (ref 26.0–34.0)
MCHC: 32.9 g/dL (ref 30.0–36.0)
MCV: 87.6 fL (ref 78.0–100.0)
PLATELETS: 223 10*3/uL (ref 150–400)
RBC: 4.1 MIL/uL (ref 3.87–5.11)
RDW: 14.5 % (ref 11.5–15.5)
WBC: 2.7 10*3/uL — AB (ref 4.0–10.5)

## 2014-07-23 LAB — ABO/RH: ABO/RH(D): B POS

## 2014-07-23 NOTE — Patient Instructions (Signed)
Your procedure is scheduled on:  Tuesday, July 27, 2014  Enter through the [employer] [facility] at:  12 noon  Pick up the phone at the desk and dial [phone].  Call this number if you have problems the morning of surgery: [phone].  Remember: Do NOT eat food:  After midnight Monday Do NOT drink clear liquids after:  9:30 am day of surgery Take these medicines the morning of surgery with a SIP OF WATER:  Lisinopril, Metoprolol, Plaquenil, Potassium  Do NOT wear jewelry (body piercing), metal hair clips/bobby pins, make-up, or nail polish. Do NOT wear lotions, powders, or perfumes.  You may wear deoderant. Do NOT shave for 48 hours prior to surgery. Do NOT bring valuables to the hospital. Contacts, dentures, or bridgework may not be worn into surgery. Leave suitcase in car.  After surgery it may be brought to your room.  For patients admitted to the hospital, checkout time is 11:00 AM the day of discharge.

## 2014-07-26 ENCOUNTER — Telehealth: Payer: Self-pay | Admitting: Family

## 2014-07-26 MED ORDER — DEXTROSE 5 % IV SOLN
2.0000 g | INTRAVENOUS | Status: DC
  Filled 2014-07-26: qty 2

## 2014-07-26 NOTE — Telephone Encounter (Signed)
-----   Message from [place] sent at 07/26/2014  5:08 PM EDT ----- Orlean Patten,  I am writing in regards to a mutual patient that you saw for medical clearance on 07/21/14.  We never got a statement of clearance.  Could you please write one in a progress note in EPIC, we can access this and know whether or not you recommend proceeding with the surgery.  Thanks.  Her surgery is scheduled for tomorrow and we don't have a recommendaton.  If you have any questions feel free to call me directly.   [facility] [address] [place], [place]  [address] Phone [phone] Fax [phone]

## 2014-07-26 NOTE — Telephone Encounter (Signed)
Office note addended clearing pt for surgery.

## 2014-07-26 NOTE — H&P (Addendum)
Amber [name] is an 54 y.o. female with fibroid uterus and history of irregular heavy periods, desiring an abdominal myomectomy.   She has tried ocps, megace before with temporary relief of symptoms. She desires to retain her uterus, declines a hysterectomy. She has received several IV iron transfusions for her history of iron deficiency anemia.   Pertinent Gynecological History: Menses: Heavy irregular bleeding.  Bleeding: intermenstrual bleeding Contraception: abstinence DES exposure: denies Blood transfusions: none Sexually transmitted diseases: no past history Previous GYN Procedures: Laparoscopic ovarian cystectomy complicated by bowel perforatio that required laparotomy for repair.   Last mammogram: normal Date: 04/2014 Last pap: normal Date: 01/2014 OB History: G3 P0030   Menstrual History: No LMP recorded. Patient is not currently having periods (Reason: Premenopausal).   Last menses in January 2016, but she has continued to have heavy irregular bleeding intermittently.      Past Medical History  Diagnosis Date  . SAB (spontaneous abortion)   . Elective abortion   . Ectopic pregnancy     RIGHT salpingostomy  . Hypertension   . GERD (gastroesophageal reflux disease)   . Anemia   . Sjogren's disease   . Glaucoma     both eyes  . Rheumatoid arthritis(714.0) 11/07/2011  . History of chicken pox   . Headache     migraines    Past Surgical History  Procedure Laterality Date  . Dilation and curettage of uterus      X 2  . Knee surgery      MENISCUS TEAR REPAIR  . Pelvic laparoscopy  1994    IN 1999 LAPAROSCOPY WITH INCIDENTAL ENTEROTOMY REQUIRING LAPAROTOMY  . Abdominal surgery      bowel repair  . Colonoscopy      Family History  Problem Relation Age of Onset  . Hypertension Mother     died from heart disease  . Heart disease Mother     deceased, unknown cause  . Diabetes Sister   . Hypertension Sister   . Diabetes Sister     Social History:   reports that she has never smoked. She has never used smokeless tobacco. She reports that she does not drink alcohol or use illicit drugs.  Allergies: No Known Allergies  No prescriptions prior to admission    ROS  Costitutional: Denies fever/chills/weight loss/gain Cardiovascular: denies chest pain or palpitations.  Pulmonary: Denies coughing or wheezing Gastrointestinal: denies nausea/vomiting/diarrhea Musculoskeletal: denies muscle or joint pain or aches Neurologic: denies paresthesias or loss of sensation or strength  There were no vitals taken for this visit. Physical Exam  Gen: NAD CVS: s1, s2, rrr Pulm: CTAB Abd: s/NT/uterus 20 week sized with palpable fibroids.  Ext: warm and well perfused, no edema, no calftenderness   Pelvic US and Sonohysterogram 06/09/14:  Uterus 13.5 x 9x 9 cm with multiple fibroids, at least six measured ranging in size 1.7 cm to 5cm.  Ems 58mm,no intracavitary lesions.   Endometrial biopsy 05/18/14: secretory endometrium, no atypia or hyperplasia.   CBC    Component Value Date/Time   WBC 2.7* 07/23/2014 1010   WBC 2.9* 05/20/2014 0808   RBC 4.10 07/23/2014 1010   RBC 3.71 05/20/2014 0808   HGB 11.8* 07/23/2014 1010   HGB 10.1* 05/20/2014 0808   HCT 35.9* 07/23/2014 1010   HCT 31.1* 05/20/2014 0808   PLT 223 07/23/2014 1010   PLT 203 05/20/2014 0808   MCV 87.6 07/23/2014 1010   MCV 83.9 05/20/2014 0808   MCH 28.8 07/23/2014 1010  MCH 27.3 05/20/2014 0808   MCHC 32.9 07/23/2014 1010   MCHC 32.5 05/20/2014 0808   RDW 14.5 07/23/2014 1010   RDW 13.7 05/20/2014 0808   LYMPHSABS 1.6 07/21/2014 1032   LYMPHSABS 1.5 05/20/2014 0808   MONOABS 0.2 07/21/2014 1032   MONOABS 0.3 05/20/2014 0808   EOSABS 0.0 07/21/2014 1032   EOSABS 0.1 05/20/2014 0808   BASOSABS 0.0 07/21/2014 1032   BASOSABS 0.0 05/20/2014 0808   CMP     Component Value Date/Time   NA 137 07/21/2014 1032   NA 138 05/20/2014 0808   K 3.8 07/21/2014 1032   K 3.9  05/20/2014 0808   CL 106 07/21/2014 1032   CL 108* 06/25/2012 1508   CO2 27 07/21/2014 1032   CO2 22 05/20/2014 0808   GLUCOSE 88 07/21/2014 1032   GLUCOSE 84 05/20/2014 0808   GLUCOSE 107* 06/25/2012 1508   BUN 12 07/21/2014 1032   BUN 10.2 05/20/2014 0808   CREATININE 0.68 07/21/2014 1032   CREATININE 0.7 05/20/2014 0808   CREATININE 0.73 11/25/2012 0846   CALCIUM 9.6 07/21/2014 1032   CALCIUM 8.7 05/20/2014 0808   PROT 8.5* 07/21/2014 1032   PROT 8.0 05/20/2014 0808   ALBUMIN 3.7 07/21/2014 1032   ALBUMIN 3.1* 05/20/2014 0808   AST 16 07/21/2014 1032   AST 18 05/20/2014 0808   ALT 15 07/21/2014 1032   ALT 21 05/20/2014 0808   ALKPHOS 43 07/21/2014 1032   ALKPHOS 45 05/20/2014 0808   BILITOT 0.5 07/21/2014 1032   BILITOT 0.32 05/20/2014 0808   GFRNONAA >89 10/01/2012 1412   GFRAA >89 31/51/7616 0737   Conflict (See Lab Report): B POS/B POS     Assessment/Plan:  54 y/o P0 with symptomatic  fibroid uterus, who has tried unsuccessful medical management of her fibroids, now desiring surgical management with conservation of her uterus,  For abdominal myomectomy. Discussed risks, benefits and alternatives of abdominal, myomectomy including risks of bleeding requiring transfusion, hysterectomy, risks of infection and damage to organs. We discussed myomectomy is not definitive treatment and that new fibroids could grow in the future.  All questions were answered.      Waymon Amato [facility] 07/26/2014, 6:29 PM  I saw patient today, no changes in above History and physical.  Dr. Alesia Richards.

## 2014-07-27 ENCOUNTER — Encounter (HOSPITAL_COMMUNITY): Payer: Self-pay | Admitting: Emergency Medicine

## 2014-07-27 ENCOUNTER — Encounter (HOSPITAL_COMMUNITY)
Admission: RE | Disposition: A | Discharge status: Home / Self Care | Payer: Self-pay | Source: Ambulatory Visit | Attending: Obstetrics & Gynecology

## 2014-07-27 ENCOUNTER — Inpatient Hospital Stay (HOSPITAL_COMMUNITY)
Admission: RE | Admit: 2014-07-27 | Discharge: 2014-07-29 | DRG: 743 | Disposition: A | Discharge status: Home / Self Care | Payer: BLUE CROSS/BLUE SHIELD | Source: Ambulatory Visit | Attending: Obstetrics & Gynecology | Admitting: Obstetrics & Gynecology

## 2014-07-27 ENCOUNTER — Inpatient Hospital Stay (HOSPITAL_COMMUNITY): Payer: BLUE CROSS/BLUE SHIELD | Admitting: Anesthesiology

## 2014-07-27 DIAGNOSIS — I10 Essential (primary) hypertension: Secondary | ICD-10-CM | POA: Diagnosis present

## 2014-07-27 DIAGNOSIS — H409 Unspecified glaucoma: Secondary | ICD-10-CM | POA: Diagnosis present

## 2014-07-27 DIAGNOSIS — D509 Iron deficiency anemia, unspecified: Secondary | ICD-10-CM | POA: Diagnosis present

## 2014-07-27 DIAGNOSIS — Z9889 Other specified postprocedural states: Secondary | ICD-10-CM | POA: Diagnosis not present

## 2014-07-27 DIAGNOSIS — D259 Leiomyoma of uterus, unspecified: Secondary | ICD-10-CM | POA: Diagnosis present

## 2014-07-27 DIAGNOSIS — N921 Excessive and frequent menstruation with irregular cycle: Secondary | ICD-10-CM | POA: Diagnosis present

## 2014-07-27 DIAGNOSIS — G43909 Migraine, unspecified, not intractable, without status migrainosus: Secondary | ICD-10-CM | POA: Diagnosis present

## 2014-07-27 DIAGNOSIS — K219 Gastro-esophageal reflux disease without esophagitis: Secondary | ICD-10-CM | POA: Diagnosis present

## 2014-07-27 DIAGNOSIS — Z8249 Family history of ischemic heart disease and other diseases of the circulatory system: Secondary | ICD-10-CM

## 2014-07-27 DIAGNOSIS — D219 Benign neoplasm of connective and other soft tissue, unspecified: Secondary | ICD-10-CM | POA: Diagnosis present

## 2014-07-27 DIAGNOSIS — M3500 Sicca syndrome, unspecified: Secondary | ICD-10-CM | POA: Diagnosis present

## 2014-07-27 DIAGNOSIS — M069 Rheumatoid arthritis, unspecified: Secondary | ICD-10-CM | POA: Diagnosis present

## 2014-07-27 HISTORY — PX: MYOMECTOMY: SHX85

## 2014-07-27 LAB — PREPARE RBC (CROSSMATCH)

## 2014-07-27 LAB — HEMOGLOBIN AND HEMATOCRIT, BLOOD
HEMATOCRIT: 34.9 % — AB (ref 36.0–46.0)
HEMOGLOBIN: 12.1 g/dL (ref 12.0–15.0)

## 2014-07-27 LAB — PREGNANCY, URINE: PREG TEST UR: NEGATIVE

## 2014-07-27 SURGERY — MYOMECTOMY, ABDOMINAL APPROACH
Anesthesia: General | Site: Abdomen

## 2014-07-27 MED ORDER — SODIUM CHLORIDE 0.9 % IJ SOLN
INTRAMUSCULAR | Status: AC
  Filled 2014-07-27: qty 100

## 2014-07-27 MED ORDER — LIDOCAINE HCL (CARDIAC) 20 MG/ML IV SOLN
INTRAVENOUS | Status: DC | PRN
  Administered 2014-07-27: 40 mg via INTRAVENOUS

## 2014-07-27 MED ORDER — SCOPOLAMINE 1 MG/3DAYS TD PT72
MEDICATED_PATCH | TRANSDERMAL | Status: AC
  Administered 2014-07-27: 1.5 mg via TRANSDERMAL
  Filled 2014-07-27: qty 1

## 2014-07-27 MED ORDER — HYDROXYCHLOROQUINE SULFATE 200 MG PO TABS
200.0000 mg | ORAL_TABLET | Freq: Two times a day (BID) | ORAL | Status: DC
  Administered 2014-07-28 – 2014-07-29 (×3): 200 mg via ORAL
  Filled 2014-07-27 (×5): qty 1

## 2014-07-27 MED ORDER — SODIUM CHLORIDE 0.9 % IV SOLN
INTRAVENOUS | Status: DC | PRN
  Administered 2014-07-27: 17:00:00 via INTRAVENOUS

## 2014-07-27 MED ORDER — FERROUS SULFATE 325 (65 FE) MG PO TABS
325.0000 mg | ORAL_TABLET | Freq: Three times a day (TID) | ORAL | Status: DC
  Administered 2014-07-28 – 2014-07-29 (×3): 325 mg via ORAL
  Filled 2014-07-27 (×3): qty 1

## 2014-07-27 MED ORDER — MENTHOL 3 MG MT LOZG: 1.0000 | LOZENGE | OROMUCOSAL | Status: DC | PRN

## 2014-07-27 MED ORDER — DEXAMETHASONE SODIUM PHOSPHATE 10 MG/ML IJ SOLN
INTRAMUSCULAR | Status: DC | PRN
  Administered 2014-07-27: 10 mg via INTRAVENOUS

## 2014-07-27 MED ORDER — LACTATED RINGERS IV SOLN
INTRAVENOUS | Status: DC
  Administered 2014-07-27 (×5): via INTRAVENOUS

## 2014-07-27 MED ORDER — ROCURONIUM BROMIDE 100 MG/10ML IV SOLN
INTRAVENOUS | Status: DC | PRN
  Administered 2014-07-27 (×3): 10 mg via INTRAVENOUS
  Administered 2014-07-27: 50 mg via INTRAVENOUS
  Administered 2014-07-27: 10 mg via INTRAVENOUS

## 2014-07-27 MED ORDER — FENTANYL CITRATE (PF) 100 MCG/2ML IJ SOLN
INTRAMUSCULAR | Status: DC | PRN
  Administered 2014-07-27 (×2): 50 ug via INTRAVENOUS
  Administered 2014-07-27 (×4): 100 ug via INTRAVENOUS

## 2014-07-27 MED ORDER — ONDANSETRON HCL 4 MG/2ML IJ SOLN
INTRAMUSCULAR | Status: AC
  Filled 2014-07-27: qty 2

## 2014-07-27 MED ORDER — DOCUSATE SODIUM 100 MG PO CAPS
100.0000 mg | ORAL_CAPSULE | Freq: Two times a day (BID) | ORAL | Status: DC
  Administered 2014-07-28 – 2014-07-29 (×3): 100 mg via ORAL
  Filled 2014-07-27 (×3): qty 1

## 2014-07-27 MED ORDER — FENTANYL CITRATE (PF) 250 MCG/5ML IJ SOLN
INTRAMUSCULAR | Status: AC
  Filled 2014-07-27: qty 5

## 2014-07-27 MED ORDER — PROPOFOL 10 MG/ML IV BOLUS
INTRAVENOUS | Status: AC
  Filled 2014-07-27: qty 20

## 2014-07-27 MED ORDER — SODIUM CHLORIDE 0.9 % IV SOLN: 10.0000 mL/h | Freq: Once | INTRAVENOUS | Status: DC

## 2014-07-27 MED ORDER — NEOSTIGMINE METHYLSULFATE 10 MG/10ML IV SOLN
INTRAVENOUS | Status: AC
  Filled 2014-07-27: qty 1

## 2014-07-27 MED ORDER — LABETALOL HCL 5 MG/ML IV SOLN
INTRAVENOUS | Status: AC
  Filled 2014-07-27: qty 4

## 2014-07-27 MED ORDER — ONDANSETRON HCL 4 MG/2ML IJ SOLN
4.0000 mg | Freq: Four times a day (QID) | INTRAMUSCULAR | Status: DC | PRN
  Administered 2014-07-28: 4 mg via INTRAVENOUS
  Filled 2014-07-27: qty 2

## 2014-07-27 MED ORDER — KETOROLAC TROMETHAMINE 30 MG/ML IJ SOLN
30.0000 mg | Freq: Four times a day (QID) | INTRAMUSCULAR | Status: AC
  Administered 2014-07-27 – 2014-07-28 (×3): 30 mg via INTRAVENOUS
  Filled 2014-07-27 (×3): qty 1

## 2014-07-27 MED ORDER — LIDOCAINE HCL (CARDIAC) 20 MG/ML IV SOLN
INTRAVENOUS | Status: AC
  Filled 2014-07-27: qty 5

## 2014-07-27 MED ORDER — HYDROMORPHONE HCL 1 MG/ML IJ SOLN
INTRAMUSCULAR | Status: AC
  Filled 2014-07-27: qty 1

## 2014-07-27 MED ORDER — NALOXONE HCL 0.4 MG/ML IJ SOLN: 0.4000 mg | INTRAMUSCULAR | Status: DC | PRN

## 2014-07-27 MED ORDER — NEOSTIGMINE METHYLSULFATE 10 MG/10ML IV SOLN
INTRAVENOUS | Status: DC | PRN
  Administered 2014-07-27: 4 mg via INTRAVENOUS

## 2014-07-27 MED ORDER — LABETALOL HCL 5 MG/ML IV SOLN
INTRAVENOUS | Status: DC | PRN
  Administered 2014-07-27 (×2): 5 mg via INTRAVENOUS

## 2014-07-27 MED ORDER — CEFAZOLIN SODIUM-DEXTROSE 2-3 GM-% IV SOLR
2.0000 g | INTRAVENOUS | Status: AC
  Administered 2014-07-27: 2 g via INTRAVENOUS

## 2014-07-27 MED ORDER — OXYCODONE-ACETAMINOPHEN 5-325 MG PO TABS
1.0000 | ORAL_TABLET | ORAL | Status: DC | PRN
  Administered 2014-07-28 (×2): 1 via ORAL
  Administered 2014-07-28: 2 via ORAL
  Administered 2014-07-29 (×2): 1 via ORAL
  Filled 2014-07-27: qty 2
  Filled 2014-07-27 (×4): qty 1

## 2014-07-27 MED ORDER — VASOPRESSIN 20 UNIT/ML IV SOLN
INTRAVENOUS | Status: AC
  Filled 2014-07-27: qty 1

## 2014-07-27 MED ORDER — DIPHENHYDRAMINE HCL 12.5 MG/5ML PO ELIX: 12.5000 mg | ORAL_SOLUTION | Freq: Four times a day (QID) | ORAL | Status: DC | PRN

## 2014-07-27 MED ORDER — ONDANSETRON HCL 4 MG/2ML IJ SOLN
INTRAMUSCULAR | Status: DC | PRN
  Administered 2014-07-27: 4 mg via INTRAVENOUS

## 2014-07-27 MED ORDER — DIPHENHYDRAMINE HCL 50 MG/ML IJ SOLN: 12.5000 mg | Freq: Four times a day (QID) | INTRAMUSCULAR | Status: DC | PRN

## 2014-07-27 MED ORDER — PROPOFOL 10 MG/ML IV BOLUS
INTRAVENOUS | Status: DC | PRN
  Administered 2014-07-27: 150 mg via INTRAVENOUS
  Administered 2014-07-27: 50 mg via INTRAVENOUS

## 2014-07-27 MED ORDER — CEFAZOLIN SODIUM-DEXTROSE 2-3 GM-% IV SOLR
INTRAVENOUS | Status: AC
  Filled 2014-07-27: qty 50

## 2014-07-27 MED ORDER — MIDAZOLAM HCL 5 MG/5ML IJ SOLN
INTRAMUSCULAR | Status: DC | PRN
  Administered 2014-07-27: 2 mg via INTRAVENOUS

## 2014-07-27 MED ORDER — GLYCOPYRROLATE 0.2 MG/ML IJ SOLN
INTRAMUSCULAR | Status: AC
  Filled 2014-07-27: qty 4

## 2014-07-27 MED ORDER — SODIUM CHLORIDE 0.9 % IJ SOLN: 9.0000 mL | INTRAMUSCULAR | Status: DC | PRN

## 2014-07-27 MED ORDER — SCOPOLAMINE 1 MG/3DAYS TD PT72
1.0000 | MEDICATED_PATCH | Freq: Once | TRANSDERMAL | Status: DC
  Administered 2014-07-27: 1.5 mg via TRANSDERMAL

## 2014-07-27 MED ORDER — DEXAMETHASONE SODIUM PHOSPHATE 10 MG/ML IJ SOLN
INTRAMUSCULAR | Status: AC
Start: 2014-07-27 — End: 2014-07-27
  Filled 2014-07-27: qty 1

## 2014-07-27 MED ORDER — ACETAMINOPHEN 160 MG/5ML PO SOLN
975.0000 mg | Freq: Once | ORAL | Status: AC
  Administered 2014-07-27: 975 mg via ORAL

## 2014-07-27 MED ORDER — VASOPRESSIN 20 UNIT/ML IV SOLN
INTRAVENOUS | Status: DC | PRN
  Administered 2014-07-27: 20 [IU]

## 2014-07-27 MED ORDER — LACTATED RINGERS IV SOLN
INTRAVENOUS | Status: DC
  Administered 2014-07-27 – 2014-07-28 (×2): via INTRAVENOUS

## 2014-07-27 MED ORDER — IBUPROFEN 600 MG PO TABS
600.0000 mg | ORAL_TABLET | Freq: Four times a day (QID) | ORAL | Status: DC | PRN
  Administered 2014-07-28 – 2014-07-29 (×2): 600 mg via ORAL
  Filled 2014-07-27 (×2): qty 1

## 2014-07-27 MED ORDER — HYDROMORPHONE HCL 1 MG/ML IJ SOLN
0.2500 mg | INTRAMUSCULAR | Status: DC | PRN
  Administered 2014-07-27 (×4): 0.5 mg via INTRAVENOUS

## 2014-07-27 MED ORDER — GLYCOPYRROLATE 0.2 MG/ML IJ SOLN
INTRAMUSCULAR | Status: DC | PRN
  Administered 2014-07-27: .8 mg via INTRAVENOUS

## 2014-07-27 MED ORDER — SODIUM CHLORIDE 0.9 % IJ SOLN
INTRAMUSCULAR | Status: DC | PRN
  Administered 2014-07-27: 100 mL

## 2014-07-27 MED ORDER — MIDAZOLAM HCL 2 MG/2ML IJ SOLN
INTRAMUSCULAR | Status: AC
  Filled 2014-07-27: qty 2

## 2014-07-27 MED ORDER — ACETAMINOPHEN 160 MG/5ML PO SOLN
ORAL | Status: AC
  Administered 2014-07-27: 975 mg via ORAL
  Filled 2014-07-27: qty 40.6

## 2014-07-27 MED ORDER — ONDANSETRON HCL 4 MG PO TABS: 4.0000 mg | ORAL_TABLET | Freq: Three times a day (TID) | ORAL | Status: DC | PRN

## 2014-07-27 MED ORDER — HYDROMORPHONE HCL 1 MG/ML IJ SOLN
INTRAMUSCULAR | Status: DC | PRN
  Administered 2014-07-27 (×2): 0.5 mg via INTRAVENOUS

## 2014-07-27 MED ORDER — HYDROMORPHONE 0.3 MG/ML IV SOLN
INTRAVENOUS | Status: DC
  Administered 2014-07-27: 2.1 mg via INTRAVENOUS
  Administered 2014-07-27: 0.3 mg via INTRAVENOUS
  Administered 2014-07-28: 2.1 mg via INTRAVENOUS
  Administered 2014-07-28: 06:00:00 via INTRAVENOUS
  Administered 2014-07-28: 1.8 mg via INTRAVENOUS
  Filled 2014-07-27 (×2): qty 25

## 2014-07-27 SURGICAL SUPPLY — 36 items
BARRIER ADHS 3X4 INTERCEED (GAUZE/BANDAGES/DRESSINGS) ×6 IMPLANT
BLADE SURG 10 STRL SS (BLADE) ×8 IMPLANT
BRR ADH 4X3 ABS CNTRL BYND (GAUZE/BANDAGES/DRESSINGS) ×4
CANISTER SUCT 3000ML (MISCELLANEOUS) ×4 IMPLANT
CLOTH BEACON ORANGE TIMEOUT ST (SAFETY) ×4 IMPLANT
DRAIN PENROSE 1/4X12 LTX (DRAIN) ×3 IMPLANT
DRAPE CESAREAN BIRTH W POUCH (DRAPES) ×3 IMPLANT
DRAPE WARM FLUID 44X44 (DRAPE) ×3 IMPLANT
DRSG OPSITE POSTOP 4X10 (GAUZE/BANDAGES/DRESSINGS) ×4 IMPLANT
DURAPREP 26ML APPLICATOR (WOUND CARE) ×4 IMPLANT
GAUZE SPONGE 4X4 16PLY XRAY LF (GAUZE/BANDAGES/DRESSINGS) IMPLANT
GLOVE BIOGEL PI IND STRL 7.0 (GLOVE) ×6 IMPLANT
GLOVE BIOGEL PI INDICATOR 7.0 (GLOVE) ×6
GLOVE SURG SS PI 6.5 STRL IVOR (GLOVE) ×4 IMPLANT
GOWN STRL REUS W/TWL LRG LVL3 (GOWN DISPOSABLE) ×12 IMPLANT
NEEDLE HYPO 22GX1.5 SAFETY (NEEDLE) ×4 IMPLANT
NS IRRIG 1000ML POUR BTL (IV SOLUTION) ×4 IMPLANT
PACK ABDOMINAL GYN (CUSTOM PROCEDURE TRAY) ×4 IMPLANT
PAD OB MATERNITY 4.3X12.25 (PERSONAL CARE ITEMS) ×4 IMPLANT
PROTECTOR NERVE ULNAR (MISCELLANEOUS) ×4 IMPLANT
SPONGE LAP 18X18 X RAY DECT (DISPOSABLE) ×6 IMPLANT
SUT MON AB 4-0 PS1 27 (SUTURE) ×4 IMPLANT
SUT PLAIN 2 0 XLH (SUTURE) ×3 IMPLANT
SUT VIC AB 0 CT1 18XCR BRD8 (SUTURE) ×4 IMPLANT
SUT VIC AB 0 CT1 27 (SUTURE) ×40
SUT VIC AB 0 CT1 27XBRD ANBCTR (SUTURE) ×14 IMPLANT
SUT VIC AB 0 CT1 8-18 (SUTURE) ×8
SUT VIC AB 3-0 CT1 27 (SUTURE)
SUT VIC AB 3-0 CT1 TAPERPNT 27 (SUTURE) IMPLANT
SUT VIC AB 4-0 SH 27 (SUTURE) ×16
SUT VIC AB 4-0 SH 27XANBCTRL (SUTURE) ×8 IMPLANT
SUT VICRYL 0 27 CT2 27 ABS (SUTURE) IMPLANT
SUT VICRYL 0 TIES 12 18 (SUTURE) ×4 IMPLANT
SYR CONTROL 10ML LL (SYRINGE) ×4 IMPLANT
TOWEL OR 17X24 6PK STRL BLUE (TOWEL DISPOSABLE) ×8 IMPLANT
TRAY FOLEY CATH SILVER 14FR (SET/KITS/TRAYS/PACK) ×4 IMPLANT

## 2014-07-27 NOTE — Anesthesia Procedure Notes (Signed)
Procedure Name: Intubation Date/Time: 07/27/2014 1:55 PM Performed by: Ignacia Bayley Pre-anesthesia Checklist: Patient identified, Emergency Drugs available, Suction available and Patient being monitored Patient Re-evaluated:Patient Re-evaluated prior to inductionOxygen Delivery Method: Circle system utilized Preoxygenation: Pre-oxygenation with 100% oxygen Intubation Type: IV induction Ventilation: Mask ventilation without difficulty Laryngoscope Size: Miller and 2 Grade View: Grade I Tube type: Oral Tube size: 7.0 mm Number of attempts: 1 Airway Equipment and Method: Stylet Placement Confirmation: ETT inserted through vocal cords under direct vision,  positive ETCO2 and breath sounds checked- equal and bilateral Secured at: 20 cm Tube secured with: Tape Dental Injury: Teeth and Oropharynx as per pre-operative assessment

## 2014-07-27 NOTE — Brief Op Note (Signed)
07/27/2014  1:50 PM  PATIENT:  Amber [name]  54 y.o. female  PRE-OPERATIVE DIAGNOSIS:  Fibroids, Menorrhiagia, Pelvic Pain  POST-OPERATIVE DIAGNOSIS:  Same as above.   PROCEDURE:  Procedure(s): MYOMECTOMY (N/A) HYSTERECTOMY ABDOMINAL (N/A)  SURGEON:  Surgeon(s) and Role:    * Waymon Amato, MD - Primary  PHYSICIAN ASSISTANT:  Earnstine Regal, PA.    ANESTHESIA:   general  EBL:   1500cc  BLOOD ADMINISTERED:2 units PRBC  DRAINS: none   LOCAL MEDICATIONS USED:  NONE  SPECIMEN:  Source of Specimen:  Fibroids.   DISPOSITION OF SPECIMEN:  PATHOLOGY  COUNTS:  YES  TOURNIQUET:  None  DICTATION: .Dragon Dictation  PLAN OF CARE: Admit to inpatient   PATIENT DISPOSITION:  PACU - hemodynamically stable.   Delay start of Pharmacological VTE agent (>24hrs) due to surgical blood loss or risk of bleeding: not applicable

## 2014-07-27 NOTE — Op Note (Signed)
Amber [name].  DOB [birth year]  [id number]  Pre-op diagnosis:   1. Fibroid uterus.  2. Menorrhagia.  3. Unsuccesfull medical management of fibroid symptoms, now desiring surgical management with conservation of her uterus.    Post-op diagnosis: Same as above.   Procedure: Abdominal myomectomy  Anesthesia:  General   Complications: None  Findings: Uterus with multiple uterine fibroids, about 13 total.  Normal right ovary and tube.  Normal left fallopian tube.  Small left ovary with dense adhesions to the uterus.    EBL: 1500 cc  IVF: 3L NS  Urine output: 600 cc  Indications: 54 y/o P0 who desired abdominal myomectomy as medical management of her fibroid symptoms had been unsuccessful.     PROCEDURE:  Informed consent was obtained from the patient to undergo the procedure after explaining the risks, benefits and alternatives of the procedure.   A foley catheter was placed in and the patient was prepped vaginally and abdominally and draped in the usual sterile fashion.    Ancef ws given pre-operatively.  A pfannenstiel incision was made with the scalpel and carried through the underlying subcutaneous layer and fascia with the bovie.  Small perforators were contained with the bovie.  The fascia was nicked in the midline and the fascia separated from the rectus muscles superiorly, inferiorly and bilaterally.  The rectus muscle was separated in the midline and the uterus was inspected and the fibroids noted.  The Alexis /O'connor O sullivan retractor was placed in and the uterus exteriorized.  Vasopressin was injected over the fibroids, incised with scalpel and the fibroid was shelled from the sorrounding myometrium.  The uterine defects were closed with 0-vicryl in a running stitch in 3 layers. The subserosal layer was closed in a baseball stitch using 4-0 vicryl.  A similar procedure was perfomed to remove the remaining fibroids.  When possible I removed as many fibroids as possible  through the same incision.  Three main incisions were made through the uterus, one anteriorly to remove at least three fibroids (one of them about 5 cm that extended into the endometrium) and there was entry into the endometrium anteriorly.  Two main incisions were made posteriorly with no entry posteriorly.  During removal of the anterior fibroids brisk bleeding was encountered from fibroid bed and this was contained with sutures.  Intra op EBL was noted to be about 1500 cc and 2 units of blood were transfused intraoperatively.  At the end of the procedure good  hemostasis was noted.  The pelvis was irrigated and suctioned out.  Interceed was placed over the uterus and returned into the cavity.      The fascia was closed off using 0-vicryl in a running stitch.  The muscle was reapproximated with 0-vicryl in a running stitch.  The subcutaneous layer was closed off with plain suture.  The skin was closed off with 4-0 monocryl in a subcuticular stitch.    Dermabond then dressing was placed over the incision.    The patient was awoken from anesthesia and taken to the recovery room in stable condition.    Dr. Alesia Richards.

## 2014-07-27 NOTE — Anesthesia Preprocedure Evaluation (Addendum)
Anesthesia Evaluation  Patient identified by MRN, date of birth, ID band Patient awake    Reviewed: Allergy & Precautions, H&P , Patient's Chart, lab work & pertinent test results, reviewed documented beta blocker date and time   Airway Mallampati: II  TM Distance: >3 FB Neck ROM: full    Dental no notable dental hx. (+) Dental Advisory Given, Poor Dentition Upper teeth, carious on back side, mirco-fractures visible:   Pulmonary  breath sounds clear to auscultation  Pulmonary exam normal       Cardiovascular hypertension, On Medications and On Home Beta Blockers Rhythm:regular Rate:Normal     Neuro/Psych    GI/Hepatic   Endo/Other    Renal/GU      Musculoskeletal   Abdominal   Peds  Hematology  (+) anemia ,   Anesthesia Other Findings  Hypertension.... Well controlled ; took her meds this am. Nl EKG    GERD (gastroesophageal reflux disease)                Anemia... 11.9; discussed transfusion risk    Sjogren's disease; dry moth/eyes; no skin contractures  Glaucoma   Rheumatoid arthritis...Marland KitchenMarland Kitchen jouint pain, no limit in neck ROM           Reproductive/Obstetrics                            Anesthesia Physical Anesthesia Plan  ASA: III  Anesthesia Plan: General   Post-op Pain Management:    Induction: Intravenous  Airway Management Planned: Oral ETT  Additional Equipment:   Intra-op Plan:   Post-operative Plan: Extubation in OR  Informed Consent: I have reviewed the patients History and Physical, chart, labs and discussed the procedure including the risks, benefits and alternatives for the proposed anesthesia with the patient or authorized representative who has indicated his/her understanding and acceptance.   Dental Advisory Given and Dental advisory given  Plan Discussed with: CRNA and Surgeon  Anesthesia Plan Comments: (  Discussed general anesthesia, including  possible nausea, instrumentation of airway, sore throat,pulmonary aspiration, etc. I asked if the were any outstanding questions, or  concerns before we proceeded. )        Anesthesia Quick Evaluation

## 2014-07-27 NOTE — Anesthesia Postprocedure Evaluation (Signed)
Anesthesia Post Note  Patient: Amber [name]  Procedure(s) Performed: Procedure(s) (LRB): ABDOMINAL MYOMECTOMY (N/A)  Anesthesia type: General  Patient location: PACU  Post pain: Pain level controlled  Post assessment: Post-op Vital signs reviewed  Last Vitals:  Filed Vitals:   07/27/14 1845  BP: 171/88  Pulse: 98  Temp:   Resp: 18    Post vital signs: Reviewed  Level of consciousness: sedated  Complications: No apparent anesthesia complications

## 2014-07-27 NOTE — Transfer of Care (Signed)
Immediate Anesthesia Transfer of Care Note  Patient: Amber [name]  Procedure(s) Performed: Procedure(s): ABDOMINAL MYOMECTOMY (N/A)  Patient Location: PACU  Anesthesia Type:General  Level of Consciousness: awake, alert , oriented and patient cooperative  Airway & Oxygen Therapy: Patient Spontanous Breathing and Patient connected to nasal cannula oxygen  Post-op Assessment: Report given to RN and Post -op Vital signs reviewed and stable  Post vital signs: Reviewed and stable  Last Vitals:  BP173/88 HR 110 RR 16 TEMP 34.6   Complications: No apparent anesthesia complications

## 2014-07-28 ENCOUNTER — Encounter (HOSPITAL_COMMUNITY): Payer: Self-pay | Admitting: *Deleted

## 2014-07-28 LAB — CBC
HCT: 31.1 % — ABNORMAL LOW (ref 36.0–46.0)
HEMOGLOBIN: 10.7 g/dL — AB (ref 12.0–15.0)
MCH: 30.1 pg (ref 26.0–34.0)
MCHC: 34.4 g/dL (ref 30.0–36.0)
MCV: 87.6 fL (ref 78.0–100.0)
PLATELETS: 170 10*3/uL (ref 150–400)
RBC: 3.55 MIL/uL — ABNORMAL LOW (ref 3.87–5.11)
RDW: 14.7 % (ref 11.5–15.5)
WBC: 7.2 10*3/uL (ref 4.0–10.5)

## 2014-07-28 LAB — TYPE AND SCREEN
ABO/RH(D): B POS
ANTIBODY SCREEN: NEGATIVE
Unit division: 0
Unit division: 0

## 2014-07-28 LAB — URINE MICROSCOPIC-ADD ON

## 2014-07-28 LAB — URINALYSIS, ROUTINE W REFLEX MICROSCOPIC
Bilirubin Urine: NEGATIVE
Glucose, UA: NEGATIVE mg/dL
KETONES UR: NEGATIVE mg/dL
Leukocytes, UA: NEGATIVE
Nitrite: NEGATIVE
PROTEIN: NEGATIVE mg/dL
Specific Gravity, Urine: >1.03 — ABNORMAL HIGH (ref 1.005–1.030)
Urobilinogen, UA: 0.2 mg/dL (ref 0.0–1.0)
pH: 6 (ref 5.0–8.0)

## 2014-07-28 MED ORDER — SODIUM CHLORIDE 0.9 % IV BOLUS (SEPSIS): 500.0000 mL | Freq: Once | INTRAVENOUS | Status: DC

## 2014-07-28 MED ORDER — CEFAZOLIN SODIUM-DEXTROSE 2-3 GM-% IV SOLR: 2.0000 g | Freq: Three times a day (TID) | INTRAVENOUS | Status: DC

## 2014-07-28 NOTE — Addendum Note (Signed)
Addendum  created 07/28/14 3220 by Flossie Dibble, CRNA   Modules edited: Notes Section   Notes Section:  File: [id number]

## 2014-07-28 NOTE — Anesthesia Postprocedure Evaluation (Signed)
Anesthesia Post Note  Patient: Amber [name]  Procedure(s) Performed: Procedure(s): ABDOMINAL MYOMECTOMY (N/A)  Anesthesia type: General  Patient location: Women's Unit  Post pain: Pain level controlled  Post assessment: Post-op Vital signs reviewed  Last Vitals: BP 108/61 mmHg  Pulse 88  Temp(Src) 36.9 C (Oral)  Resp 16  SpO2 97%  LMP 07/25/2014  Post vital signs: Reviewed  Level of consciousness: awake  Complications: No apparent anesthesia complications

## 2014-07-28 NOTE — Progress Notes (Signed)
Amber [name] is a10 y.o.  [id number]  Post Op Date # 1:  S/P Abdominal Myomectomy  Subjective: Patient is Doing well postoperatively. Patient has Pain is controlled with current analgesics. Medications being used: prescription NSAID's including ketorolac (Toradol) and narcotic analgesics including hydromorphone (Dilaudid). Patient has ambulated with report of dizziness the entire time.  Is tolerating liquids without nausea and states that she is not hungry-has not passed flatus. Still has Foley catheter due to R.N. report that urine appeared red.  Currently it appears very concentrated but no frank red appearance.  Objective: Vital signs in last 24 hours: Temp:  [98.4 F (36.9 C)-99.3 F (37.4 C)] 98.4 F (36.9 C) (07/27 0546) Pulse Rate:  [73-114] 88 (07/27 0546) Resp:  [11-18] 16 (07/27 0552) BP: (108-173)/(56-94) 108/61 mmHg (07/27 0546) SpO2:  [94 %-100 %] 97 % (07/27 0552)  Intake/Output from previous day: 07/26 0701 - 07/27 0700 In: 6411 [P.O.:500; I.V.:5241] Out: 2900 [Urine:1400] Intake/Output this shift:    Recent Labs Lab 07/21/14 1032 07/23/14 1010 07/27/14 2145 07/28/14 0534  WBC 2.6* 2.7*  --  7.2  HGB 12.6 11.8* 12.1 10.7*  HCT 37.4 35.9* 34.9* 31.1*  PLT 225.0 223  --  170     Recent Labs Lab 07/21/14 1032  NA 137  K 3.8  CL 106  CO2 27  BUN 12  CREATININE 0.68  CALCIUM 9.6  PROT 8.5*  BILITOT 0.5  ALKPHOS 43  ALT 15  AST 16  GLUCOSE 88    EXAM: General: alert, cooperative and no distress Resp: clear to auscultation bilaterally Cardio: regular rate and rhythm, S1, S2 normal, no murmur, click, rub or gallop GI: Decreased bowel sounds, dressing is clean/dry/intact Extremities: SCD hose in place and functioning;  No calf tenderness and negative Homan's  Vaginal Bleeding: Faint dried blood stain throughout.   Assessment: s/p Procedure(s): ABDOMINAL MYOMECTOMY: stable, progressing well and anemia  Plan: Encourage  ambulation Advance to PO medication Orthostatic Vital Signs  Urinalysis with micro and culture Routine care per Dr. Alesia Richards  LOS: 1 day    Amber [name],ELMIRA, PA-C 07/28/2014 7:09 AM   I saw and examined patient at bedside and agree with above findings, assessment and plan.  Patient has been able to ambulate without difficulty.  Tolerating clears.  Scant vaginal bleeding.  Patient is not orthostatic per vitals.  Her urine has cleared after fluid bolus.  Her urine output is 400 cc in last 3 hours.  Will d/c PCA pump, start percocet and ibuprofen. Will d/c foley and allow voiding. Advance to regular diet in pm if tolerating clears.  I discussed intra op findings and procedure.  We discussed blood transfusion done intra-operatively.  We discussed that she is going to require cesarean delivery incase of successful pregnancy in the future.  All of her questions were answered.  Hold off on BP meds as normal BPs now without medication. Dr. Alesia Richards.

## 2014-07-29 ENCOUNTER — Institutional Professional Consult (permissible substitution): Payer: BLUE CROSS/BLUE SHIELD | Admitting: Gynecology

## 2014-07-29 LAB — URINE CULTURE: Culture: NO GROWTH

## 2014-07-29 MED ORDER — IBUPROFEN 600 MG PO TABS: ORAL_TABLET | ORAL | Status: DC

## 2014-07-29 MED ORDER — ONDANSETRON HCL 4 MG PO TABS: 4.0000 mg | ORAL_TABLET | Freq: Three times a day (TID) | ORAL | Status: DC | PRN

## 2014-07-29 MED ORDER — OXYCODONE-ACETAMINOPHEN 5-325 MG PO TABS: 1.0000 | ORAL_TABLET | ORAL | Status: DC | PRN

## 2014-07-29 NOTE — Progress Notes (Signed)
Amber [name] is a77 y.o.  [id number]  Post Op Date # 2:  Abdominal Myomectomy Subjective: Patient is Doing well postoperatively. Patient has Pain is controlled with current analgesics. Medications being used: prescription NSAID's including Ibuprofen and narcotic analgesics including oxycodone/acetaminophen (Percocet, Tylox). Tolerating regular diet, ambulating, and voiding without difficulty.   Objective: Vital signs in last 24 hours: Temp:  [98.6 F (37 C)-99.7 F (37.6 C)] 99.1 F (37.3 C) (07/28 0626) Pulse Rate:  [82-110] 94 (07/28 0626) Resp:  [18] 18 (07/28 0626) BP: (105-117)/(42-66) 113/59 mmHg (07/28 0626) SpO2:  [94 %-96 %] 94 % (07/28 0626) Weight:  [198 lb 2 oz (89.869 kg)] 198 lb 2 oz (89.869 kg) (07/27 0947)  Intake/Output from previous day: 07/27 0701 - 07/28 0700 In: -  Out: 1500 [Urine:1400] Intake/Output this shift:    Recent Labs Lab 07/23/14 1010 07/27/14 2145 07/28/14 0534  WBC 2.7*  --  7.2  HGB 11.8* 12.1 10.7*  HCT 35.9* 34.9* 31.1*  PLT 223  --  170    No results for input(s): NA, K, CL, CO2, BUN, CREATININE, CALCIUM, PROT, BILITOT, ALKPHOS, ALT, AST, GLUCOSE in the last 168 hours.  Invalid input(s): LABALBU  EXAM: General: alert, cooperative and no distress Resp: clear to auscultation bilaterally Cardio: regular rate and rhythm, S1, S2 normal, no murmur, click, rub or gallop GI: Bowel sounds present, soft and dressing clean/dry/intact Extremities: Homans sign is negative, no sign of DVT and no calf tenderness. Vaginal Bleeding: Faint dry pink stain on perineal pad.   Assessment: s/p Procedure(s): ABDOMINAL MYOMECTOMY: stable, progressing well, tolerating diet and anemia  Plan: Consider discharge home today.  Per Dr. Alesia Richards  LOS: 2 days    Jahnyla Parrillo, PA-C 07/29/2014 7:49 AM

## 2014-07-29 NOTE — Discharge Instructions (Signed)
Call [facility] @ [phone] if:  You have a temperature greater than or equal to 100.4 degrees Farenheit orally You have pain that is not made better by the pain medication given and taken as directed You have excessive bleeding or problems urinating  Take Colace (Docusate Sodium/Stool Softener) 100 mg 2-3 times daily while taking narcotic pain medicine to avoid constipation or until bowel movements are regular. Tale iron supplements 3 times a day for the next 12 weeks Take Ibuprofen 600 mg with food, every 6 hours for 5 days then as needed for pain  Do not take your blood pressure medications until you are seen by Dr. Alesia Richards on August 04, 2014 at 11 a.m.  You may drive after 2 weeks You may walk up steps  You may shower You may resume a regular diet  Keep incisions clean and dry;  remove bandage on August 02, 2014  Do not lift over 15 pounds for 6 weeks Avoid anything in vagina for 6 weeks (or until after your post-operative visit)   Keep 6 week post operative visit with Dr. Alesia Richards on September 07, 2014 at 11:15 a.m.

## 2014-07-29 NOTE — Discharge Summary (Signed)
Physician Discharge Summary  Patient ID: Amber [name] MRN: [id number] DOB/AGE: Oct 26, 1960 54 y.o.  Admit date: 07/27/2014 Discharge date: 07/29/2014   Discharge Diagnoses: Symptomatic Uterine Fibroids,  Menorrhagia  and Pelvic Adhesions  Active Problems:   Fibroids   Operation: Abdominal Myomectomy   Discharged Condition: Good  Hospital Course: On the date of admission,  the patient underwent the aforementioned procedure and tolerated it well.  During the operation the patient was transfused 2 units of packed red blood cells due to a blood loss of 1500 cc.  Her post operative course was unremarkable with the patient tolerating a hemoglobin of 10.7.  By post operative day #2 the patient had resumed bowel and bladder function and was therefore deemed ready for discharge home.  Disposition: 01-Home or Self Care  Discharge Medications:    Medication List    STOP taking these medications        acetaminophen 325 MG tablet  Commonly known as:  TYLENOL     cholecalciferol 400 UNITS Tabs tablet  Commonly known as:  VITAMIN D     lisinopril 20 MG tablet  Commonly known as:  PRINIVIL,ZESTRIL     metoprolol 50 MG tablet  Commonly known as:  LOPRESSOR     traMADol 50 MG tablet  Commonly known as:  ULTRAM      TAKE these medications        ferrous sulfate 325 (65 FE) MG tablet  Take 325 mg by mouth 3 (three) times daily with meals.     hydroxychloroquine 200 MG tablet  Commonly known as:  PLAQUENIL  Take 200 mg by mouth 2 (two) times daily.     ibuprofen 600 MG tablet  Commonly known as:  ADVIL,MOTRIN  1  po  pc every 6 hours for 5 days then as needed for pain     ondansetron 4 MG tablet  Commonly known as:  ZOFRAN  Take 1 tablet (4 mg total) by mouth every 8 (eight) hours as needed for nausea or vomiting.     oxyCODONE-acetaminophen 5-325 MG per tablet  Commonly known as:  PERCOCET/ROXICET  Take 1-2 tablets by mouth every 4 (four) hours as needed for severe  pain (moderate to severe pain (when tolerating fluids)).     potassium chloride SA 20 MEQ tablet  Commonly known as:  K-DUR,KLOR-CON  Take 20 mEq by mouth at bedtime.     SUMAtriptan 50 MG tablet  Commonly known as:  IMITREX  Take 1 tablet (50 mg total) by mouth every 2 (two) hours as needed for migraine. May repeat in 2 hours if headache persists or recurs.     TRAVATAN Z 0.004 % Soln ophthalmic solution  Generic drug:  Travoprost (BAK Free)  Place 1 drop into both eyes At bedtime.          Follow-up: Dr. Waymon Amato on August 04, 2014 at 11:15 a.m. and  September 07, 2014 at 11:15 a.m.   SignedEarnstine Regal, PA-C 07/29/2014, 8:15 AM

## 2014-07-29 NOTE — Progress Notes (Signed)
Pt discharged to home with 2 adult women.  Condition stable.  Pt to car via wheelchair with RN.  No equipment for home ordered at discharge.

## 2014-07-30 NOTE — Discharge Summary (Signed)
Physician Discharge Summary  Patient ID: Amber [name] MRN: [id number] DOB/AGE: 1960-04-10 54 y.o.  Admit date: 07/27/2014 Discharge date: 07/29/2014  Admission Diagnoses:  Fibroid uterus, menorrhagia.  Discharge Diagnoses:  Active Problems:   Fibroids  Menorrhagia. Abdominal myomectomy.  Discharged Condition: stable  Hospital Course:  The patient underwent an abdominal myomectomy.  EBL intraoperatively was 1500cc and the patient received 2 units blood transfusion.  Post-operatively she did well.  She was tolerating a regular diet, ambulating and voiding without difficulty.  She was deemed stable for discharge.    Consults: None  Significant Diagnostic Studies: .labs:  CBC    Component Value Date/Time   WBC 7.2 07/28/2014 0534   WBC 2.9* 05/20/2014 0808   RBC 3.55* 07/28/2014 0534   RBC 3.71 05/20/2014 0808   HGB 10.7* 07/28/2014 0534   HGB 10.1* 05/20/2014 0808   HCT 31.1* 07/28/2014 0534   HCT 31.1* 05/20/2014 0808   PLT 170 07/28/2014 0534   PLT 203 05/20/2014 0808   MCV 87.6 07/28/2014 0534   MCV 83.9 05/20/2014 0808   MCH 30.1 07/28/2014 0534   MCH 27.3 05/20/2014 0808   MCHC 34.4 07/28/2014 0534   MCHC 32.5 05/20/2014 0808   RDW 14.7 07/28/2014 0534   RDW 13.7 05/20/2014 0808   LYMPHSABS 1.6 07/21/2014 1032   LYMPHSABS 1.5 05/20/2014 0808   MONOABS 0.2 07/21/2014 1032   MONOABS 0.3 05/20/2014 0808   EOSABS 0.0 07/21/2014 1032   EOSABS 0.1 05/20/2014 0808   BASOSABS 0.0 07/21/2014 1032   BASOSABS 0.0 05/20/2014 0808      Treatments: surgery: Abdominal myomectomy.   Discharge Exam: Blood pressure 115/59, pulse 114, temperature 99.4 F (37.4 C), temperature source Oral, resp. rate 18, height 5\' 1"  (1.549 m), weight 89.869 kg (198 lb 2 oz), last menstrual period 07/25/2014, SpO2 94 %.   Normal post-op exam: see See daily progress note.   Disposition: 01-Home or Self Care     Medication List    STOP taking these medications        acetaminophen 325 MG tablet  Commonly known as:  TYLENOL     cholecalciferol 400 UNITS Tabs tablet  Commonly known as:  VITAMIN D     lisinopril 20 MG tablet  Commonly known as:  PRINIVIL,ZESTRIL     metoprolol 50 MG tablet  Commonly known as:  LOPRESSOR     traMADol 50 MG tablet  Commonly known as:  ULTRAM      TAKE these medications        ferrous sulfate 325 (65 FE) MG tablet  Take 325 mg by mouth 3 (three) times daily with meals.     hydroxychloroquine 200 MG tablet  Commonly known as:  PLAQUENIL  Take 200 mg by mouth 2 (two) times daily.     ibuprofen 600 MG tablet  Commonly known as:  ADVIL,MOTRIN  1  po  pc every 6 hours for 5 days then as needed for pain     ondansetron 4 MG tablet  Commonly known as:  ZOFRAN  Take 1 tablet (4 mg total) by mouth every 8 (eight) hours as needed for nausea or vomiting.     oxyCODONE-acetaminophen 5-325 MG per tablet  Commonly known as:  PERCOCET/ROXICET  Take 1-2 tablets by mouth every 4 (four) hours as needed for severe pain (moderate to severe pain (when tolerating fluids)).     potassium chloride SA 20 MEQ tablet  Commonly known as:  K-DUR,KLOR-CON  Take 20 mEq by  mouth at bedtime.     SUMAtriptan 50 MG tablet  Commonly known as:  IMITREX  Take 1 tablet (50 mg total) by mouth every 2 (two) hours as needed for migraine. May repeat in 2 hours if headache persists or recurs.     TRAVATAN Z 0.004 % Soln ophthalmic solution  Generic drug:  Travoprost (BAK Free)  Place 1 drop into both eyes At bedtime.           Follow-up Information    Follow up with Alinda Dooms, MD On 08/04/2014.   Specialty:  Obstetrics and Gynecology   Why:  1 week appointment time is 11 a.m.   Contact information:   [address] [place] [address] [phone]       Follow up with Alinda Dooms, MD On 09/07/2014.   Specialty:  Obstetrics and Gynecology   Why:  Appointment time 11:15 a.m.   Contact information:   [address] [place] [address] [phone]       Signed: Alinda Dooms 07/30/2014, 10:03 PM

## 2014-08-13 ENCOUNTER — Ambulatory Visit: Payer: BLUE CROSS/BLUE SHIELD | Admitting: Family

## 2014-08-15 ENCOUNTER — Other Ambulatory Visit: Payer: Self-pay | Admitting: Family

## 2014-08-16 NOTE — Telephone Encounter (Signed)
Lisinopril refill denied. Rx was supposed to have been "stopped at discharge in April"? Not on current med list.

## 2014-08-18 ENCOUNTER — Telehealth: Payer: Self-pay | Admitting: Family

## 2014-08-18 NOTE — Telephone Encounter (Signed)
°  Relation to EQ:JEAD Call back number:[phone] Pharmacy:[facility]  Reason for call: pt is needing rx lisinopril.

## 2014-08-18 NOTE — Telephone Encounter (Signed)
[name] please advise if pt is supposed to be taking Lisinopril? It is not on current med list.  Thanks!

## 2014-08-18 NOTE — Telephone Encounter (Signed)
This was discontinued by GYN after her surgery.  Has she still been taking? What has her BP been?

## 2014-08-20 MED ORDER — FUROSEMIDE 20 MG PO TABS: 20.0000 mg | ORAL_TABLET | Freq: Every day | ORAL | Status: DC | PRN

## 2014-08-20 MED ORDER — LISINOPRIL 20 MG PO TABS: 20.0000 mg | ORAL_TABLET | Freq: Every day | ORAL | Status: DC

## 2014-08-20 NOTE — Telephone Encounter (Signed)
Continue lisinopril.  Please send refill for dose she is taking (?20mg ) please confirm with pt. Can add lasix 20mg  once daily prn swelling. Follow up in 1-2 weeks.  If calf pain/asymetric LE edema- she should go to ER.

## 2014-08-20 NOTE — Telephone Encounter (Signed)
Pt states she stopped medication 2 weeks after surgery. During that time BP ran around 142/78. States she also has had swelling in her ankles. She restarted the medication and BP still running around 144/80.  Please advise?

## 2014-08-20 NOTE — Telephone Encounter (Signed)
Left message for pt to return my call.

## 2014-08-20 NOTE — Telephone Encounter (Signed)
Pt is returning your call please call back

## 2014-08-20 NOTE — Telephone Encounter (Signed)
Notified pt and sent Rxs.  Pt states right ankle is more pronounced than the left. No redness or pain. Advised pt to proceed to the ER for further evaluation to rule out DVT given recent surgery. Pt voices understanding and states she will await nephew to come in from work as she is still unable to drive.

## 2014-08-31 ENCOUNTER — Encounter: Payer: Self-pay | Admitting: Family

## 2014-08-31 ENCOUNTER — Ambulatory Visit (INDEPENDENT_AMBULATORY_CARE_PROVIDER_SITE_OTHER): Payer: BLUE CROSS/BLUE SHIELD | Admitting: Family

## 2014-08-31 VITALS — BP 128/82 | HR 88 | Temp 98.5°F | Resp 16 | Ht 61.0 in | Wt 195.0 lb

## 2014-08-31 DIAGNOSIS — R7309 Other abnormal glucose: Secondary | ICD-10-CM | POA: Diagnosis not present

## 2014-08-31 DIAGNOSIS — I10 Essential (primary) hypertension: Secondary | ICD-10-CM | POA: Diagnosis not present

## 2014-08-31 DIAGNOSIS — R7303 Prediabetes: Secondary | ICD-10-CM

## 2014-08-31 DIAGNOSIS — M069 Rheumatoid arthritis, unspecified: Secondary | ICD-10-CM

## 2014-08-31 DIAGNOSIS — Z23 Encounter for immunization: Secondary | ICD-10-CM | POA: Diagnosis not present

## 2014-08-31 DIAGNOSIS — R739 Hyperglycemia, unspecified: Secondary | ICD-10-CM

## 2014-08-31 LAB — HEMOGLOBIN A1C: HEMOGLOBIN A1C: 5 % (ref 4.6–6.5)

## 2014-08-31 NOTE — Assessment & Plan Note (Signed)
Obtain follow up A1C.   

## 2014-08-31 NOTE — Assessment & Plan Note (Signed)
Repeat BP was improve. Continue current meds.

## 2014-08-31 NOTE — Assessment & Plan Note (Signed)
Refer to [name].

## 2014-08-31 NOTE — Progress Notes (Signed)
Subjective:    Patient ID: Amber [name], female    DOB: [birth year], 54 y.o.   MRN: [id number]  HPI  Ms. Amber [name] is a 54 yr old female who presents today for follow up.  1) HTN- On lasix and lisinopril. Did not take lisinopril this AM.  Notes improvement in the Edema though.  She denies CP or SOB.  BP Readings from Last 3 Encounters:  08/31/14 150/90  07/29/14 115/59  07/23/14 130/77    2) Hyperglycemia-   Lab Results  Component Value Date   HGBA1C 6.0 04/09/2014   3) RA- Her rheumatologist left and she would like referral to a different rheumatologist.   Review of Systems    see HPI  Past Medical History  Diagnosis Date  . SAB (spontaneous abortion)   . Elective abortion   . Ectopic pregnancy     RIGHT salpingostomy  . Hypertension   . GERD (gastroesophageal reflux disease)   . Anemia   . Sjogren's disease   . Glaucoma     both eyes  . Rheumatoid arthritis(714.0) 11/07/2011  . History of chicken pox   . Headache     migraines    Social History   Social History  . Marital Status: Single    Spouse Name: Amber [name]  . Number of Children: 0  . Years of Education: Amber [name]   Occupational History  .     Social History Main Topics  . Smoking status: Never Smoker   . Smokeless tobacco: Never Used  . Alcohol Use: No  . Drug Use: No  . Sexual Activity: Not on file     Comment: 1st intercourse- 17, partners- 5   Other Topics Concern  . Not on file   Social History Narrative   Regular exercise:  No   Caffeine Use: 2 cups coffee daily   No children   "Happily divorced"   Reports that she is involved at her church   Works with intellectually challenged adults   Born [place]- she came to [place] as adult.  Age 58.              Past Surgical History  Procedure Laterality Date  . Dilation and curettage of uterus      X 2  . Knee surgery      MENISCUS TEAR REPAIR  . Pelvic laparoscopy  1994    IN 1999 LAPAROSCOPY WITH INCIDENTAL ENTEROTOMY REQUIRING  LAPAROTOMY  . Abdominal surgery      bowel repair  . Colonoscopy    . Myomectomy Amber [name] 07/27/2014    Procedure: ABDOMINAL MYOMECTOMY;  Surgeon: Waymon Amato, MD;  Location: [facility];  Service: Gynecology;  Laterality: Amber [name];    Family History  Problem Relation Age of Onset  . Hypertension Mother     died from heart disease  . Heart disease Mother     deceased, unknown cause  . Diabetes Sister   . Hypertension Sister   . Diabetes Sister     No Known Allergies  Current Outpatient Prescriptions on File Prior to Visit  Medication Sig Dispense Refill  . ferrous sulfate 325 (65 FE) MG tablet Take 325 mg by mouth 3 (three) times daily with meals.     . furosemide (LASIX) 20 MG tablet Take 1 tablet (20 mg total) by mouth daily as needed for fluid. 30 tablet 3  . hydroxychloroquine (PLAQUENIL) 200 MG tablet Take 200 mg by mouth 2 (two) times daily.     Marland Kitchen ibuprofen (  ADVIL,MOTRIN) 600 MG tablet 1  po  pc every 6 hours for 5 days then as needed for pain 30 tablet 1  . lisinopril (PRINIVIL,ZESTRIL) 20 MG tablet Take 1 tablet (20 mg total) by mouth daily. 30 tablet 3  . ondansetron (ZOFRAN) 4 MG tablet Take 1 tablet (4 mg total) by mouth every 8 (eight) hours as needed for nausea or vomiting. 20 tablet 0  . oxyCODONE-acetaminophen (PERCOCET/ROXICET) 5-325 MG per tablet Take 1-2 tablets by mouth every 4 (four) hours as needed for severe pain (moderate to severe pain (when tolerating fluids)). 30 tablet 0  . potassium chloride SA (K-DUR,KLOR-CON) 20 MEQ tablet Take 20 mEq by mouth at bedtime.    . SUMAtriptan (IMITREX) 50 MG tablet Take 1 tablet (50 mg total) by mouth every 2 (two) hours as needed for migraine. May repeat in 2 hours if headache persists or recurs. 10 tablet 0  . TRAVATAN Z 0.004 % SOLN ophthalmic solution Place 1 drop into both eyes At bedtime.     No current facility-administered medications on file prior to visit.    BP 128/82 mmHg  Pulse 88  Temp(Src) 98.5 F (36.9 C) (Oral)  Resp  16  Ht 5\' 1"  (1.549 m)  Wt 195 lb (88.451 kg)  BMI 36.86 kg/m2  SpO2 99%    Objective:   Physical Exam  Constitutional: She appears well-developed and well-nourished.  Cardiovascular: Normal rate, regular rhythm and normal heart sounds.   No murmur heard. Pulmonary/Chest: Effort normal and breath sounds normal. No respiratory distress. She has no wheezes.  Musculoskeletal:  Trace bilateral LE edema noted.   Psychiatric: She has a normal mood and affect. Her behavior is normal. Judgment and thought content normal.          Assessment & Plan:

## 2014-08-31 NOTE — Patient Instructions (Signed)
Please complete lab work prior to leaving. Follow up in 3 months.  

## 2014-08-31 NOTE — Progress Notes (Signed)
Pre visit review using our clinic review tool, if applicable. No additional management support is needed unless otherwise documented below in the visit note. 

## 2014-09-02 ENCOUNTER — Encounter: Payer: Self-pay | Admitting: Family

## 2014-09-23 ENCOUNTER — Ambulatory Visit (HOSPITAL_BASED_OUTPATIENT_CLINIC_OR_DEPARTMENT_OTHER): Payer: BLUE CROSS/BLUE SHIELD | Admitting: Oncology

## 2014-09-23 ENCOUNTER — Telehealth: Payer: Self-pay | Admitting: Oncology

## 2014-09-23 ENCOUNTER — Other Ambulatory Visit (HOSPITAL_BASED_OUTPATIENT_CLINIC_OR_DEPARTMENT_OTHER): Payer: BLUE CROSS/BLUE SHIELD

## 2014-09-23 VITALS — BP 128/69 | HR 65 | Temp 98.0°F | Resp 18 | Ht 61.0 in | Wt 195.1 lb

## 2014-09-23 DIAGNOSIS — D509 Iron deficiency anemia, unspecified: Secondary | ICD-10-CM

## 2014-09-23 DIAGNOSIS — N924 Excessive bleeding in the premenopausal period: Secondary | ICD-10-CM | POA: Diagnosis not present

## 2014-09-23 LAB — CBC WITH DIFFERENTIAL/PLATELET
BASO%: 0.7 % (ref 0.0–2.0)
Basophils Absolute: 0 10*3/uL (ref 0.0–0.1)
EOS ABS: 0.1 10*3/uL (ref 0.0–0.5)
EOS%: 3.2 % (ref 0.0–7.0)
HEMATOCRIT: 35.3 % (ref 34.8–46.6)
HGB: 11.4 g/dL — ABNORMAL LOW (ref 11.6–15.9)
LYMPH%: 56.2 % — AB (ref 14.0–49.7)
MCH: 27.5 pg (ref 25.1–34.0)
MCHC: 32.4 g/dL (ref 31.5–36.0)
MCV: 85.1 fL (ref 79.5–101.0)
MONO#: 0.2 10*3/uL (ref 0.1–0.9)
MONO%: 10.5 % (ref 0.0–14.0)
NEUT%: 29.4 % — ABNORMAL LOW (ref 38.4–76.8)
NEUTROS ABS: 0.7 10*3/uL — AB (ref 1.5–6.5)
PLATELETS: 221 10*3/uL (ref 145–400)
RBC: 4.14 10*6/uL (ref 3.70–5.45)
RDW: 13.6 % (ref 11.2–14.5)
WBC: 2.3 10*3/uL — AB (ref 3.9–10.3)
lymph#: 1.3 10*3/uL (ref 0.9–3.3)

## 2014-09-23 LAB — COMPREHENSIVE METABOLIC PANEL (CC13)
ALK PHOS: 50 U/L (ref 40–150)
ALT: 14 U/L (ref 0–55)
ANION GAP: 5 meq/L (ref 3–11)
AST: 15 U/L (ref 5–34)
Albumin: 3.6 g/dL (ref 3.5–5.0)
BUN: 13.5 mg/dL (ref 7.0–26.0)
CALCIUM: 9.6 mg/dL (ref 8.4–10.4)
CO2: 25 mEq/L (ref 22–29)
Chloride: 109 mEq/L (ref 98–109)
Creatinine: 0.8 mg/dL (ref 0.6–1.1)
Glucose: 85 mg/dl (ref 70–140)
Potassium: 4.2 mEq/L (ref 3.5–5.1)
Sodium: 139 mEq/L (ref 136–145)
TOTAL PROTEIN: 8.7 g/dL — AB (ref 6.4–8.3)
Total Bilirubin: 0.51 mg/dL (ref 0.20–1.20)

## 2014-09-23 LAB — IRON AND TIBC CHCC
%SAT: 14 % — ABNORMAL LOW (ref 21–57)
Iron: 39 ug/dL — ABNORMAL LOW (ref 41–142)
TIBC: 276 ug/dL (ref 236–444)
UIBC: 237 ug/dL (ref 120–384)

## 2014-09-23 LAB — FERRITIN CHCC: FERRITIN: 52 ng/mL (ref 9–269)

## 2014-09-23 NOTE — Progress Notes (Signed)
Hematology and Oncology Follow Up Visit  Amber [name] [id number] 10/16/1960 54 y.o. 09/23/2014 9:12 AM   Principle Diagnosis: 54-year-old woman with iron deficiency anemia due to menorrhagia and uterine fibroids. 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  Prior Therapy:  She is status post IV iron infusion in the form of Feraheme given on 05/05/2012, 10/2012 and 09/2013.    Current therapy: Oral iron supplements on a daily basis (325 mg by mouth 3 times a day).   Interim History: Amber [name] presents today for a followup visit. Since the last visit, she underwent abdominal myomectomy for uterine fibroids. This was done on 07/27/2014 and tolerated it well. She was found to have close to 13 uterine fibroids. She had close to 1500 mL of blood loss postoperatively. She did receive 2 units of packed red cell transfusions because of that. Since her operation, she is recovering well at this time. She  return to work as of 09/22/2014. She is still sore in the abdominal area but for the most part recovered reasonably well. She is able to perform work-related duties without any decline.  She is tolerating her oral iron without any issues with constipation. She reports her energy level is improving since her operation. She still has diffuse arthralgias and myalgias related to her autoimmune disorder and follows up by a rheumatologist regarding this issue.   She has not reported any chest pain or shortness of breath. Has not reported any other bleeding such as GI bleeding or GU bleeding.  He is not reporting any headaches or blurry vision or double vision. She has not reported any hematochezia or melena. He has not reported any recurrent infections. She does not report any constitutional symptoms of fevers chills or sweats. She does not report any frequency urgency or hesitancy. She does not report any lymphadenopathy or petechiae. Remainder or view of system is  unremarkable.  Medications: I have reviewed the patient's current medications.  Current Outpatient Prescriptions  Medication Sig Dispense Refill  . ferrous sulfate 325 (65 FE) MG tablet Take 325 mg by mouth 3 (three) times daily with meals.     . furosemide (LASIX) 20 MG tablet Take 1 tablet (20 mg total) by mouth daily as needed for fluid. 30 tablet 3  . hydroxychloroquine (PLAQUENIL) 200 MG tablet Take 200 mg by mouth 2 (two) times daily.     . ibuprofen (ADVIL,MOTRIN) 600 MG tablet 1  po  pc every 6 hours for 5 days then as needed for pain 30 tablet 1  . lisinopril (PRINIVIL,ZESTRIL) 20 MG tablet Take 1 tablet (20 mg total) by mouth daily. 30 tablet 3  . ondansetron (ZOFRAN) 4 MG tablet Take 1 tablet (4 mg total) by mouth every 8 (eight) hours as needed for nausea or vomiting. 20 tablet 0  . oxyCODONE-acetaminophen (PERCOCET/ROXICET) 5-325 MG per tablet Take 1-2 tablets by mouth every 4 (four) hours as needed for severe pain (moderate to severe pain (when tolerating fluids)). 30 tablet 0  . potassium chloride SA (K-DUR,KLOR-CON) 20 MEQ tablet Take 20 mEq by mouth at bedtime.    . SUMAtriptan (IMITREX) 50 MG tablet Take 1 tablet (50 mg total) by mouth every 2 (two) hours as needed for migraine. May repeat in 2 hours if headache persists or recurs. 10 tablet 0  . TRAVATAN Z 0.004 % SOLN ophthalmic solution Place 1 drop into both eyes At bedtime.     No current facility-administered medications for this   visit.     Allergies: No Known Allergies    Physical Exam: Blood pressure 128/69, pulse 65, temperature 98 F (36.7 C), temperature source Oral, resp. rate 18, height 5' 1" (1.549 m), weight 195 lb 1.6 oz (88.497 kg), SpO2 98 %. ECOG: 0 General appearance: alert awake woman without distress. Head: Normocephalic. Oral mucosa is moist and pink without oral ulcers. Neck: no adenopathy or masses. No thyromegaly. Lymph nodes: Cervical, supraclavicular, and axillary nodes  normal. Heart:regular rate and rhythm, S1, S2 normal, no murmur, click, rub or gallop Lung:chest clear, no wheezing, rales, normal symmetric air entry no dullness to percussion. Abdomen: soft without masses or organomegaly no shifting dullness or ascites. Slightly tender to deep palpation. EXT:no erythema, or edema.   Lab Results: Lab Results  Component Value Date   WBC 2.3* 09/23/2014   HGB 11.4* 09/23/2014   HCT 35.3 09/23/2014   MCV 85.1 09/23/2014   PLT 221 09/23/2014     Chemistry      Component Value Date/Time   NA 137 07/21/2014 1032   NA 138 05/20/2014 0808   K 3.8 07/21/2014 1032   K 3.9 05/20/2014 0808   CL 106 07/21/2014 1032   CL 108* 06/25/2012 1508   CO2 27 07/21/2014 1032   CO2 22 05/20/2014 0808   BUN 12 07/21/2014 1032   BUN 10.2 05/20/2014 0808   CREATININE 0.68 07/21/2014 1032   CREATININE 0.7 05/20/2014 0808   CREATININE 0.73 11/25/2012 0846      Component Value Date/Time   CALCIUM 9.6 07/21/2014 1032   CALCIUM 8.7 05/20/2014 0808   ALKPHOS 43 07/21/2014 1032   ALKPHOS 45 05/20/2014 0808   AST 16 07/21/2014 1032   AST 18 05/20/2014 0808   ALT 15 07/21/2014 1032   ALT 21 05/20/2014 0808   BILITOT 0.5 07/21/2014 1032   BILITOT 0.32 05/20/2014 0808       Impression and Plan:  54 year old woman with the following issues:  1. Iron deficiency anemia due to menorrhagia: She is status post IV iron on June 2016 in preparation for her myomectomy. She underwent that procedure in July 2016 without any major complications. Her hemoglobin today appears adequate and appears to be recovering fairly well. The plan is to recheck her iron stores today and replace it with IV iron if needed to. I continue to encourage her to take oral iron for the time being. I anticipate that her iron needs will decline dramatically since her uterine fibroids have been removed at this time.  2. Leukocytopenia: Bone marrow biopsy done on 09/09/2013 did not show any evidence of  dysplasia or infiltrative bone marrow process. There is no evidence of any lymphoproliferative disease. Her leukocytopenia likely reactive in nature could be also related to autoimmune disease. She is following up with dermatology regarding this issue.  3. Followup will be in 4 months.   [facility] 9/22/20169:12 AM

## 2014-09-23 NOTE — Telephone Encounter (Signed)
Gave and printed appt sched and avs for pt for Jan 2017 °

## 2014-10-08 ENCOUNTER — Ambulatory Visit (INDEPENDENT_AMBULATORY_CARE_PROVIDER_SITE_OTHER): Payer: BLUE CROSS/BLUE SHIELD | Admitting: Physician Assistant

## 2014-10-08 ENCOUNTER — Encounter: Payer: Self-pay | Admitting: Physician Assistant

## 2014-10-08 VITALS — BP 149/81 | HR 85 | Temp 98.1°F | Resp 16 | Ht 61.0 in | Wt 197.2 lb

## 2014-10-08 DIAGNOSIS — J019 Acute sinusitis, unspecified: Secondary | ICD-10-CM

## 2014-10-08 DIAGNOSIS — B9689 Other specified bacterial agents as the cause of diseases classified elsewhere: Secondary | ICD-10-CM | POA: Insufficient documentation

## 2014-10-08 MED ORDER — AMOXICILLIN-POT CLAVULANATE 875-125 MG PO TABS: 1.0000 | ORAL_TABLET | Freq: Two times a day (BID) | ORAL | Status: DC

## 2014-10-08 NOTE — Progress Notes (Signed)
Patient presents to clinic today c/o  5 days of progressively worsening sinus pressure, sinus pain, ear pain, chest tightness and non-productive cough. Endorses low-grade fever. Denies recent travel or sick contact.  Denies hx of asthma or COPD. Denies smoking.   Past Medical History  Diagnosis Date  . SAB (spontaneous abortion)   . Elective abortion   . Ectopic pregnancy     RIGHT salpingostomy  . Hypertension   . GERD (gastroesophageal reflux disease)   . Anemia   . Sjogren's disease   . Glaucoma     both eyes  . Rheumatoid arthritis(714.0) 11/07/2011  . History of chicken pox   . Headache     migraines    Current Outpatient Prescriptions on File Prior to Visit  Medication Sig Dispense Refill  . ferrous sulfate 325 (65 FE) MG tablet Take 325 mg by mouth 3 (three) times daily with meals.     . furosemide (LASIX) 20 MG tablet Take 1 tablet (20 mg total) by mouth daily as needed for fluid. 30 tablet 3  . hydroxychloroquine (PLAQUENIL) 200 MG tablet Take 200 mg by mouth 2 (two) times daily.     Marland Kitchen ibuprofen (ADVIL,MOTRIN) 600 MG tablet 1  po  pc every 6 hours for 5 days then as needed for pain 30 tablet 1  . lisinopril (PRINIVIL,ZESTRIL) 20 MG tablet Take 1 tablet (20 mg total) by mouth daily. 30 tablet 3  . ondansetron (ZOFRAN) 4 MG tablet Take 1 tablet (4 mg total) by mouth every 8 (eight) hours as needed for nausea or vomiting. 20 tablet 0  . oxyCODONE-acetaminophen (PERCOCET/ROXICET) 5-325 MG per tablet Take 1-2 tablets by mouth every 4 (four) hours as needed for severe pain (moderate to severe pain (when tolerating fluids)). 30 tablet 0  . potassium chloride SA (K-DUR,KLOR-CON) 20 MEQ tablet Take 20 mEq by mouth at bedtime.    . SUMAtriptan (IMITREX) 50 MG tablet Take 1 tablet (50 mg total) by mouth every 2 (two) hours as needed for migraine. May repeat in 2 hours if headache persists or recurs. 10 tablet 0  . TRAVATAN Z 0.004 % SOLN ophthalmic solution Place 1 drop into both  eyes At bedtime.     No current facility-administered medications on file prior to visit.    No Known Allergies  Family History  Problem Relation Age of Onset  . Hypertension Mother     died from heart disease  . Heart disease Mother     deceased, unknown cause  . Diabetes Sister   . Hypertension Sister   . Diabetes Sister     Social History   Social History  . Marital Status: Single    Spouse Name: N/A  . Number of Children: 0  . Years of Education: N/A   Occupational History  .     Social History Main Topics  . Smoking status: Never Smoker   . Smokeless tobacco: Never Used  . Alcohol Use: No  . Drug Use: No  . Sexual Activity: Not on file     Comment: 1st intercourse- 17, partners- 5   Other Topics Concern  . Not on file   Social History Narrative   Regular exercise:  No   Caffeine Use: 2 cups coffee daily   No children   "Happily divorced"   Reports that she is involved at her church   Works with intellectually challenged adults   Born in [place]- she came to [place] as adult.  Age 13.  Review of Systems - See HPI.  All other ROS are negative.  BP 149/81 mmHg  Pulse 85  Temp(Src) 98.1 F (36.7 C) (Oral)  Resp 16  Ht $R'5\' 1"'xj$  (1.549 m)  Wt 197 lb 4 oz (89.472 kg)  BMI 37.29 kg/m2  SpO2 100%  Physical Exam  Constitutional: She is oriented to person, place, and time and well-developed, well-nourished, and in no distress.  HENT:  Head: Normocephalic and atraumatic.  Right Ear: External ear normal.  Left Ear: External ear normal.  Nose: Nose normal.  Mouth/Throat: Oropharynx is clear and moist. No oropharyngeal exudate.  + TTP of sinuses.  Eyes: Conjunctivae are normal.  Neck: Neck supple.  Cardiovascular: Normal rate, regular rhythm, normal heart sounds and intact distal pulses.   Pulmonary/Chest: Effort normal and breath sounds normal. No respiratory distress. She has no wheezes. She has no rales. She exhibits no tenderness.    Neurological: She is alert and oriented to person, place, and time.  Skin: Skin is warm and dry. No rash noted.  Psychiatric: Affect normal.  Vitals reviewed.   Recent Results (from the past 2160 hour(s))  CBC with Differential/Platelet     Status: Abnormal   Collection Time: 07/21/14 10:32 AM  Result Value Ref Range   WBC 2.6 (L) 4.0 - 10.5 K/uL   RBC 4.36 3.87 - 5.11 Mil/uL   Hemoglobin 12.6 12.0 - 15.0 g/dL   HCT 37.4 36.0 - 46.0 %   MCV 85.8 78.0 - 100.0 fl   MCHC 33.7 30.0 - 36.0 g/dL   RDW 14.3 11.5 - 15.5 %   Platelets 225.0 150.0 - 400.0 K/uL   Neutrophils Relative % 27.1 (L) 43.0 - 77.0 %   Lymphocytes Relative 61.4 Repeated and verified X2. (H) 12.0 - 46.0 %   Monocytes Relative 9.2 3.0 - 12.0 %   Eosinophils Relative 1.7 0.0 - 5.0 %   Basophils Relative 0.6 0.0 - 3.0 %   Neutro Abs 0.7 (L) 1.4 - 7.7 K/uL   Lymphs Abs 1.6 0.7 - 4.0 K/uL   Monocytes Absolute 0.2 0.1 - 1.0 K/uL   Eosinophils Absolute 0.0 0.0 - 0.7 K/uL   Basophils Absolute 0.0 0.0 - 0.1 K/uL  Basic metabolic panel     Status: None   Collection Time: 07/21/14 10:32 AM  Result Value Ref Range   Sodium 137 135 - 145 mEq/L   Potassium 3.8 3.5 - 5.1 mEq/L   Chloride 106 96 - 112 mEq/L   CO2 27 19 - 32 mEq/L   Glucose, Bld 88 70 - 99 mg/dL   BUN 12 6 - 23 mg/dL   Creatinine, Ser 0.68 0.40 - 1.20 mg/dL   Calcium 9.6 8.4 - 10.5 mg/dL   GFR 116.38 >60.00 mL/min  Hepatic function panel     Status: Abnormal   Collection Time: 07/21/14 10:32 AM  Result Value Ref Range   Total Bilirubin 0.5 0.2 - 1.2 mg/dL   Bilirubin, Direct 0.1 0.0 - 0.3 mg/dL   Alkaline Phosphatase 43 39 - 117 U/L   AST 16 0 - 37 U/L   ALT 15 0 - 35 U/L   Total Protein 8.5 (H) 6.0 - 8.3 g/dL   Albumin 3.7 3.5 - 5.2 g/dL  hCG, serum, qualitative     Status: None   Collection Time: 07/21/14 10:32 AM  Result Value Ref Range   Preg, Serum NEG   CBC     Status: Abnormal   Collection Time: 07/23/14 10:10 AM  Result Value Ref Range    WBC 2.7 (L) 4.0 - 10.5 K/uL   RBC 4.10 3.87 - 5.11 MIL/uL   Hemoglobin 11.8 (L) 12.0 - 15.0 g/dL   HCT 35.9 (L) 36.0 - 46.0 %   MCV 87.6 78.0 - 100.0 fL   MCH 28.8 26.0 - 34.0 pg   MCHC 32.9 30.0 - 36.0 g/dL   RDW 14.5 11.5 - 15.5 %   Platelets 223 150 - 400 K/uL  Type and screen     Status: None   Collection Time: 07/23/14 10:10 AM  Result Value Ref Range   ABO/RH(D) B POS    Antibody Screen NEG    Sample Expiration 08/06/2014    Unit Number [id number]    Blood Component Type RED CELLS,LR    Unit division 00    Status of Unit ISSUED,FINAL    Transfusion Status OK TO TRANSFUSE    Crossmatch Result Compatible    Unit Number [id number]    Blood Component Type RED CELLS,LR    Unit division 00    Status of Unit ISSUED,FINAL    Transfusion Status OK TO TRANSFUSE    Crossmatch Result Compatible   ABO/Rh     Status: None   Collection Time: 07/23/14 10:10 AM  Result Value Ref Range   ABO/RH(D) B POS   Pregnancy, urine     Status: None   Collection Time: 07/27/14 12:00 PM  Result Value Ref Range   Preg Test, Ur NEGATIVE NEGATIVE    Comment:        THE SENSITIVITY OF THIS METHODOLOGY IS >20 mIU/mL.   Prepare RBC (crossmatch)     Status: None   Collection Time: 07/27/14  4:29 PM  Result Value Ref Range   Order Confirmation ORDER PROCESSED BY BLOOD BANK   Hemoglobin and hematocrit, blood     Status: Abnormal   Collection Time: 07/27/14  9:45 PM  Result Value Ref Range   Hemoglobin 12.1 12.0 - 15.0 g/dL   HCT 34.9 (L) 36.0 - 46.0 %  CBC     Status: Abnormal   Collection Time: 07/28/14  5:34 AM  Result Value Ref Range   WBC 7.2 4.0 - 10.5 K/uL   RBC 3.55 (L) 3.87 - 5.11 MIL/uL   Hemoglobin 10.7 (L) 12.0 - 15.0 g/dL   HCT 31.1 (L) 36.0 - 46.0 %   MCV 87.6 78.0 - 100.0 fL   MCH 30.1 26.0 - 34.0 pg   MCHC 34.4 30.0 - 36.0 g/dL   RDW 14.7 11.5 - 15.5 %   Platelets 170 150 - 400 K/uL  Urinalysis, Routine w reflex microscopic (not at [facility])     Status: Abnormal    Collection Time: 07/28/14  9:10 AM  Result Value Ref Range   Color, Urine ORANGE (A) YELLOW    Comment: BIOCHEMICALS MAY BE AFFECTED BY COLOR   APPearance CLOUDY (A) CLEAR   Specific Gravity, Urine >1.030 (H) 1.005 - 1.030   pH 6.0 5.0 - 8.0   Glucose, UA NEGATIVE NEGATIVE mg/dL   Hgb urine dipstick LARGE (A) NEGATIVE   Bilirubin Urine NEGATIVE NEGATIVE   Ketones, ur NEGATIVE NEGATIVE mg/dL   Protein, ur NEGATIVE NEGATIVE mg/dL   Urobilinogen, UA 0.2 0.0 - 1.0 mg/dL   Nitrite NEGATIVE NEGATIVE   Leukocytes, UA NEGATIVE NEGATIVE  Urine microscopic-add on     Status: Abnormal   Collection Time: 07/28/14  9:10 AM  Result Value Ref Range   Squamous Epithelial / LPF RARE  RARE   RBC / HPF 7-10 <3 RBC/hpf   Bacteria, UA FEW (A) RARE  Culture, Urine     Status: None   Collection Time: 07/28/14  9:19 AM  Result Value Ref Range   Specimen Description URINE, CATHETERIZED    Special Requests NONE    Culture      NO GROWTH 1 DAY Performed at [facility]    Report Status 07/29/2014 FINAL   Hemoglobin A1c     Status: None   Collection Time: 08/31/14  2:21 PM  Result Value Ref Range   Hgb A1c MFr Bld 5.0 4.6 - 6.5 %    Comment: Glycemic Control Guidelines for People with Diabetes:Non Diabetic:  <6%Goal of Therapy: <7%Additional Action Suggested:  >8%   CBC with Differential     Status: Abnormal   Collection Time: 09/23/14  8:45 AM  Result Value Ref Range   WBC 2.3 (L) 3.9 - 10.3 10e3/uL   NEUT# 0.7 (L) 1.5 - 6.5 10e3/uL   HGB 11.4 (L) 11.6 - 15.9 g/dL   HCT 05.1 71.0 - 78.5 %   Platelets 221 145 - 400 10e3/uL   MCV 85.1 79.5 - 101.0 fL   MCH 27.5 25.1 - 34.0 pg   MCHC 32.4 31.5 - 36.0 g/dL   RBC 0.20 7.98 - 1.74 10e6/uL   RDW 13.6 11.2 - 14.5 %   lymph# 1.3 0.9 - 3.3 10e3/uL   MONO# 0.2 0.1 - 0.9 10e3/uL   Eosinophils Absolute 0.1 0.0 - 0.5 10e3/uL   Basophils Absolute 0.0 0.0 - 0.1 10e3/uL   NEUT% 29.4 (L) 38.4 - 76.8 %   LYMPH% 56.2 (H) 14.0 - 49.7 %   MONO% 10.5 0.0  - 14.0 %   EOS% 3.2 0.0 - 7.0 %   BASO% 0.7 0.0 - 2.0 %  Ferritin     Status: None   Collection Time: 09/23/14  8:45 AM  Result Value Ref Range   Ferritin 52 9 - 269 ng/ml  Iron and TIBC [facility]     Status: Abnormal   Collection Time: 09/23/14  8:45 AM  Result Value Ref Range   Iron 39 (L) 41 - 142 ug/dL   TIBC 786 897 - 693 ug/dL   UIBC 008 890 - 138 ug/dL   %SAT 14 (L) 21 - 57 %  Comprehensive metabolic panel (Cmet) - [facility]     Status: Abnormal   Collection Time: 09/23/14  8:45 AM  Result Value Ref Range   Sodium 139 136 - 145 mEq/L   Potassium 4.2 3.5 - 5.1 mEq/L   Chloride 109 98 - 109 mEq/L   CO2 25 22 - 29 mEq/L   Glucose 85 70 - 140 mg/dl    Comment: Glucose reference range is for nonfasting patients. Fasting glucose reference range is 70- 100.   BUN 13.5 7.0 - 26.0 mg/dL   Creatinine 0.8 0.6 - 1.1 mg/dL   Total Bilirubin 5.52 0.20 - 1.20 mg/dL   Alkaline Phosphatase 50 40 - 150 U/L   AST 15 5 - 34 U/L   ALT 14 0 - 55 U/L   Total Protein 8.7 (H) 6.4 - 8.3 g/dL   Albumin 3.6 3.5 - 5.0 g/dL   Calcium 9.6 8.4 - 89.4 mg/dL   Anion Gap 5 3 - 11 mEq/L   EGFR >90 >90 ml/min/1.73 m2    Comment: eGFR is calculated using the CKD-EPI Creatinine Equation (2009)    Assessment/Plan: Acute bacterial sinusitis Rx Augmentin.  Increase fluids.  Rest.  Saline nasal spray.  Probiotic.  Mucinex as directed.  Humidifier in bedroom. Delsym for cough.  Call or return to clinic if symptoms are not improving.

## 2014-10-08 NOTE — Patient Instructions (Signed)
Please take antibiotic as directed.  Increase fluid intake.  Use Saline nasal spray.  Take a daily multivitamin. Delsym over-the-counter for cough.  Place a humidifier in the bedroom.  Please call or return clinic if symptoms are not improving.  Sinusitis Sinusitis is redness, soreness, and swelling (inflammation) of the paranasal sinuses. Paranasal sinuses are air pockets within the bones of your face (beneath the eyes, the middle of the forehead, or above the eyes). In healthy paranasal sinuses, mucus is able to drain out, and air is able to circulate through them by way of your nose. However, when your paranasal sinuses are inflamed, mucus and air can become trapped. This can allow bacteria and other germs to grow and cause infection. Sinusitis can develop quickly and last only a short time (acute) or continue over a long period (chronic). Sinusitis that lasts for more than 12 weeks is considered chronic.  CAUSES  Causes of sinusitis include:  Allergies.  Structural abnormalities, such as displacement of the cartilage that separates your nostrils (deviated septum), which can decrease the air flow through your nose and sinuses and affect sinus drainage.  Functional abnormalities, such as when the small hairs (cilia) that line your sinuses and help remove mucus do not work properly or are not present. SYMPTOMS  Symptoms of acute and chronic sinusitis are the same. The primary symptoms are pain and pressure around the affected sinuses. Other symptoms include:  Upper toothache.  Earache.  Headache.  Bad breath.  Decreased sense of smell and taste.  A cough, which worsens when you are lying flat.  Fatigue.  Fever.  Thick drainage from your nose, which often is green and may contain pus (purulent).  Swelling and warmth over the affected sinuses. DIAGNOSIS  Your caregiver will perform a physical exam. During the exam, your caregiver may:  Look in your nose for signs of abnormal  growths in your nostrils (nasal polyps).  Tap over the affected sinus to check for signs of infection.  View the inside of your sinuses (endoscopy) with a special imaging device with a light attached (endoscope), which is inserted into your sinuses. If your caregiver suspects that you have chronic sinusitis, one or more of the following tests may be recommended:  Allergy tests.  Nasal culture A sample of mucus is taken from your nose and sent to a lab and screened for bacteria.  Nasal cytology A sample of mucus is taken from your nose and examined by your caregiver to determine if your sinusitis is related to an allergy. TREATMENT  Most cases of acute sinusitis are related to a viral infection and will resolve on their own within 10 days. Sometimes medicines are prescribed to help relieve symptoms (pain medicine, decongestants, nasal steroid sprays, or saline sprays).  However, for sinusitis related to a bacterial infection, your caregiver will prescribe antibiotic medicines. These are medicines that will help kill the bacteria causing the infection.  Rarely, sinusitis is caused by a fungal infection. In theses cases, your caregiver will prescribe antifungal medicine. For some cases of chronic sinusitis, surgery is needed. Generally, these are cases in which sinusitis recurs more than 3 times per year, despite other treatments. HOME CARE INSTRUCTIONS   Drink plenty of water. Water helps thin the mucus so your sinuses can drain more easily.  Use a humidifier.  Inhale steam 3 to 4 times a day (for example, sit in the bathroom with the shower running).  Apply a warm, moist washcloth to your face 3 to  4 times a day, or as directed by your caregiver.  Use saline nasal sprays to help moisten and clean your sinuses.  Take over-the-counter or prescription medicines for pain, discomfort, or fever only as directed by your caregiver. SEEK IMMEDIATE MEDICAL CARE IF:  You have increasing pain or  severe headaches.  You have nausea, vomiting, or drowsiness.  You have swelling around your face.  You have vision problems.  You have a stiff neck.  You have difficulty breathing. MAKE SURE YOU:   Understand these instructions.  Will watch your condition.  Will get help right away if you are not doing well or get worse. Document Released: 12/18/2004 Document Revised: 03/12/2011 Document Reviewed: 01/02/2011 [facility] Patient Information 2014 [place], [place].

## 2014-10-08 NOTE — Assessment & Plan Note (Signed)
Rx Augmentin.  Increase fluids.  Rest.  Saline nasal spray.  Probiotic.  Mucinex as directed.  Humidifier in bedroom. Delsym for cough.  Call or return to clinic if symptoms are not improving.  

## 2014-10-08 NOTE — Progress Notes (Signed)
Pre visit review using our clinic review tool, if applicable. No additional management support is needed unless otherwise documented below in the visit note/SLS  

## 2014-10-11 ENCOUNTER — Telehealth: Payer: Self-pay | Admitting: *Deleted

## 2014-10-11 NOTE — Telephone Encounter (Signed)
Called patient and left message on her cell phone regarding iron studies from 9/22. Per Dr. Alen Blew, she doesn't need an iron infusion. Instructed patient to call if she had any questions or concerns.

## 2014-10-12 ENCOUNTER — Ambulatory Visit (HOSPITAL_BASED_OUTPATIENT_CLINIC_OR_DEPARTMENT_OTHER)
Admission: RE | Admit: 2014-10-12 | Discharge: 2014-10-12 | Disposition: A | Discharge status: Home / Self Care | Payer: BLUE CROSS/BLUE SHIELD | Source: Ambulatory Visit | Attending: Family | Admitting: Family

## 2014-10-12 ENCOUNTER — Encounter: Payer: Self-pay | Admitting: Family

## 2014-10-12 ENCOUNTER — Encounter (INDEPENDENT_AMBULATORY_CARE_PROVIDER_SITE_OTHER): Payer: Self-pay

## 2014-10-12 ENCOUNTER — Ambulatory Visit (INDEPENDENT_AMBULATORY_CARE_PROVIDER_SITE_OTHER): Payer: BLUE CROSS/BLUE SHIELD | Admitting: Family

## 2014-10-12 VITALS — BP 146/74 | HR 74 | Temp 99.8°F | Ht 61.0 in | Wt 196.2 lb

## 2014-10-12 DIAGNOSIS — J209 Acute bronchitis, unspecified: Secondary | ICD-10-CM | POA: Diagnosis not present

## 2014-10-12 DIAGNOSIS — R05 Cough: Secondary | ICD-10-CM | POA: Diagnosis not present

## 2014-10-12 DIAGNOSIS — R0989 Other specified symptoms and signs involving the circulatory and respiratory systems: Secondary | ICD-10-CM | POA: Diagnosis not present

## 2014-10-12 DIAGNOSIS — R059 Cough, unspecified: Secondary | ICD-10-CM

## 2014-10-12 DIAGNOSIS — R062 Wheezing: Secondary | ICD-10-CM | POA: Insufficient documentation

## 2014-10-12 MED ORDER — ALBUTEROL SULFATE HFA 108 (90 BASE) MCG/ACT IN AERS: 2.0000 | INHALATION_SPRAY | Freq: Four times a day (QID) | RESPIRATORY_TRACT | Status: DC | PRN

## 2014-10-12 MED ORDER — PREDNISONE 10 MG PO TABS: ORAL_TABLET | ORAL | Status: DC

## 2014-10-12 MED ORDER — ALBUTEROL SULFATE (2.5 MG/3ML) 0.083% IN NEBU
2.5000 mg | INHALATION_SOLUTION | Freq: Once | RESPIRATORY_TRACT | Status: AC
  Administered 2014-10-12: 2.5 mg via RESPIRATORY_TRACT

## 2014-10-12 NOTE — Patient Instructions (Signed)
Start prednisone, use albuterol every 4-6 hours for the next few days. Continue augmentin. Complete chest x ray on the first floor. Call if symptoms worsen, if you have fever >101 or if not improved in 3-4 days.

## 2014-10-12 NOTE — Progress Notes (Signed)
Pre visit review using our clinic review tool, if applicable. No additional management support is needed unless otherwise documented below in the visit note. 

## 2014-10-12 NOTE — Progress Notes (Signed)
Subjective:    Patient ID: Amber [name], female    DOB: August 28, 1960, 54 y.o.   MRN: [id number]  HPI  Amber [name] is a 54 yr old female who presents today with several concerns:  Nasal congestion x 1 week, + wheezing, SOB since last week,  + pleuritic chest "tightnees" since last week.  Tmax 99.7.  Pt started augmentin on Friday for sinusitis but feels like it has moved into her chest.    Review of Systems    see HPI  Past Medical History  Diagnosis Date  . SAB (spontaneous abortion)   . Elective abortion   . Ectopic pregnancy     RIGHT salpingostomy  . Hypertension   . GERD (gastroesophageal reflux disease)   . Anemia   . Sjogren's disease ([place])   . Glaucoma     both eyes  . Rheumatoid arthritis(714.0) 11/07/2011  . History of chicken pox   . Headache     migraines    Social History   Social History  . Marital Status: Single    Spouse Name: N/A  . Number of Children: 0  . Years of Education: N/A   Occupational History  .     Social History Main Topics  . Smoking status: Never Smoker   . Smokeless tobacco: Never Used  . Alcohol Use: No  . Drug Use: No  . Sexual Activity: Not on file     Comment: 1st intercourse- 17, partners- 5   Other Topics Concern  . Not on file   Social History Narrative   Regular exercise:  No   Caffeine Use: 2 cups coffee daily   No children   "Happily divorced"   Reports that she is involved at her church   Works with intellectually challenged adults   Born [place]- she came to [place] as adult.  Age 72.              Past Surgical History  Procedure Laterality Date  . Dilation and curettage of uterus      X 2  . Knee surgery      MENISCUS TEAR REPAIR  . Pelvic laparoscopy  1994    IN 1999 LAPAROSCOPY WITH INCIDENTAL ENTEROTOMY REQUIRING LAPAROTOMY  . Abdominal surgery      bowel repair  . Colonoscopy    . Myomectomy N/A 07/27/2014    Procedure: ABDOMINAL MYOMECTOMY;  Surgeon: Waymon Amato, MD;  Location: [facility];   Service: Gynecology;  Laterality: N/A;    Family History  Problem Relation Age of Onset  . Hypertension Mother     died from heart disease  . Heart disease Mother     deceased, unknown cause  . Diabetes Sister   . Hypertension Sister   . Diabetes Sister     No Known Allergies  Current Outpatient Prescriptions on File Prior to Visit  Medication Sig Dispense Refill  . amoxicillin-clavulanate (AUGMENTIN) 875-125 MG tablet Take 1 tablet by mouth 2 (two) times daily. 14 tablet 0  . ferrous sulfate 325 (65 FE) MG tablet Take 325 mg by mouth 3 (three) times daily with meals.     . furosemide (LASIX) 20 MG tablet Take 1 tablet (20 mg total) by mouth daily as needed for fluid. 30 tablet 3  . hydroxychloroquine (PLAQUENIL) 200 MG tablet Take 200 mg by mouth 2 (two) times daily.     Marland Kitchen ibuprofen (ADVIL,MOTRIN) 600 MG tablet 1  po  pc every 6 hours for 5 days  then as needed for pain 30 tablet 1  . lisinopril (PRINIVIL,ZESTRIL) 20 MG tablet Take 1 tablet (20 mg total) by mouth daily. 30 tablet 3  . ondansetron (ZOFRAN) 4 MG tablet Take 1 tablet (4 mg total) by mouth every 8 (eight) hours as needed for nausea or vomiting. 20 tablet 0  . oxyCODONE-acetaminophen (PERCOCET/ROXICET) 5-325 MG per tablet Take 1-2 tablets by mouth every 4 (four) hours as needed for severe pain (moderate to severe pain (when tolerating fluids)). 30 tablet 0  . potassium chloride SA (K-DUR,KLOR-CON) 20 MEQ tablet Take 20 mEq by mouth at bedtime.    . SUMAtriptan (IMITREX) 50 MG tablet Take 1 tablet (50 mg total) by mouth every 2 (two) hours as needed for migraine. May repeat in 2 hours if headache persists or recurs. 10 tablet 0  . TRAVATAN Z 0.004 % SOLN ophthalmic solution Place 1 drop into both eyes At bedtime.     No current facility-administered medications on file prior to visit.    BP 146/74 mmHg  Pulse 74  Temp(Src) 99.8 F (37.7 C) (Oral)  Ht 5\' 1"  (1.549 m)  Wt 196 lb 3.2 oz (88.996 kg)  BMI 37.09 kg/m2   SpO2 100%    Objective:   Physical Exam  Constitutional: She is oriented to person, place, and time. She appears well-developed and well-nourished.  HENT:  Head: Normocephalic and atraumatic.  Right Ear: Tympanic membrane and ear canal normal.  Left Ear: Tympanic membrane and ear canal normal.  Mouth/Throat: No oropharyngeal exudate, posterior oropharyngeal edema or posterior oropharyngeal erythema.  Neck: No JVD present.  Cardiovascular: Normal rate and regular rhythm.   No murmur heard. Pulmonary/Chest: Effort normal. No respiratory distress.  Bilateral wheezing noted  Musculoskeletal: She exhibits no edema.  Neurological: She is alert and oriented to person, place, and time.  Skin: Skin is warm and dry.  Psychiatric: She has a normal mood and affect. Her behavior is normal. Judgment and thought content normal.          Assessment & Plan:  Acute bronchitis with bronchospasm- Albuterol neb in office today. Pred taper, albuterol MDI Q4-6 hrs at home.  Continue augmentin for sinusitis. Obtain CXR to exclude pneumonia.

## 2014-12-03 ENCOUNTER — Ambulatory Visit (INDEPENDENT_AMBULATORY_CARE_PROVIDER_SITE_OTHER): Payer: BLUE CROSS/BLUE SHIELD | Admitting: Family

## 2014-12-03 ENCOUNTER — Encounter: Payer: Self-pay | Admitting: Family

## 2014-12-03 VITALS — BP 162/90 | HR 80 | Temp 99.2°F | Resp 16 | Ht 61.0 in | Wt 197.8 lb

## 2014-12-03 DIAGNOSIS — M069 Rheumatoid arthritis, unspecified: Secondary | ICD-10-CM

## 2014-12-03 DIAGNOSIS — I10 Essential (primary) hypertension: Secondary | ICD-10-CM

## 2014-12-03 DIAGNOSIS — R7303 Prediabetes: Secondary | ICD-10-CM

## 2014-12-03 LAB — BASIC METABOLIC PANEL
BUN: 9 mg/dL (ref 6–23)
CALCIUM: 9.6 mg/dL (ref 8.4–10.5)
CO2: 28 meq/L (ref 19–32)
CREATININE: 0.62 mg/dL (ref 0.40–1.20)
Chloride: 106 mEq/L (ref 96–112)
GFR: 129.28 mL/min (ref >60.00)
Glucose, Bld: 89 mg/dL (ref 70–99)
Potassium: 3.9 mEq/L (ref 3.5–5.1)
SODIUM: 139 meq/L (ref 135–145)

## 2014-12-03 MED ORDER — FUROSEMIDE 20 MG PO TABS: 20.0000 mg | ORAL_TABLET | Freq: Every day | ORAL | Status: DC | PRN

## 2014-12-03 MED ORDER — LISINOPRIL 20 MG PO TABS: 20.0000 mg | ORAL_TABLET | Freq: Every day | ORAL | Status: DC

## 2014-12-03 MED ORDER — SUMATRIPTAN SUCCINATE 50 MG PO TABS: 50.0000 mg | ORAL_TABLET | ORAL | Status: DC | PRN

## 2014-12-03 MED ORDER — METOPROLOL SUCCINATE ER 50 MG PO TB24: 50.0000 mg | ORAL_TABLET | Freq: Every day | ORAL | Status: DC

## 2014-12-03 NOTE — Assessment & Plan Note (Signed)
Refer to rheumatology  

## 2014-12-03 NOTE — Patient Instructions (Addendum)
Please complete lab work prior to leaving. We will contact you about your referral to rheumatology.   Start Toprol xl for your blood pressure.   Let me know if your headache does not improve.

## 2014-12-03 NOTE — Progress Notes (Signed)
Subjective:    Patient ID: Amber [name], female    DOB: 1960/10/29, 54 y.o.   MRN: [id number]  HPI  Amber [name] is a 54 yr old female who presents today for follow up.  1) HTN- Reprts that she is using lasix per swelling.  She has not needed lasix. She reports + compliance with lisinopril.   BP Readings from Last 3 Encounters:  12/03/14 162/90  10/12/14 146/74  10/08/14 149/81   2) Hyperglycemia-  Lab Results  Component Value Date   HGBA1C 5.0 08/31/2014   3) HA- report + HA x 10 days.    4) RA/Sjogren's- she is maintained on plaquenil (ran out in early November) joint pain has worsened. Was following with [name], but he left.   She reports that [name] was unable to see her.    Review of Systems  Respiratory: Negative for cough.   Cardiovascular: Negative for chest pain and leg swelling.   See HPI  Past Medical History  Diagnosis Date  . SAB (spontaneous abortion)   . Elective abortion   . Ectopic pregnancy     RIGHT salpingostomy  . Hypertension   . GERD (gastroesophageal reflux disease)   . Anemia   . Sjogren's disease ([place])   . Glaucoma     both eyes  . Rheumatoid arthritis(714.0) 11/07/2011  . History of chicken pox   . Headache     migraines    Social History   Social History  . Marital Status: Single    Spouse Name: N/A  . Number of Children: 0  . Years of Education: N/A   Occupational History  .     Social History Main Topics  . Smoking status: Never Smoker   . Smokeless tobacco: Never Used  . Alcohol Use: No  . Drug Use: No  . Sexual Activity: Not on file     Comment: 1st intercourse- 17, partners- 5   Other Topics Concern  . Not on file   Social History Narrative   Regular exercise:  No   Caffeine Use: 2 cups coffee daily   No children   "Happily divorced"   Reports that she is involved at her church   Works with intellectually challenged adults   Born [place]- she came to [place] as adult.  Age 23.               Past Surgical History  Procedure Laterality Date  . Dilation and curettage of uterus      X 2  . Knee surgery      MENISCUS TEAR REPAIR  . Pelvic laparoscopy  1994    IN 1999 LAPAROSCOPY WITH INCIDENTAL ENTEROTOMY REQUIRING LAPAROTOMY  . Abdominal surgery      bowel repair  . Colonoscopy    . Myomectomy N/A 07/27/2014    Procedure: ABDOMINAL MYOMECTOMY;  Surgeon: Waymon Amato, MD;  Location: [facility];  Service: Gynecology;  Laterality: N/A;    Family History  Problem Relation Age of Onset  . Hypertension Mother     died from heart disease  . Heart disease Mother     deceased, unknown cause  . Diabetes Sister   . Hypertension Sister   . Diabetes Sister     No Known Allergies  Current Outpatient Prescriptions on File Prior to Visit  Medication Sig Dispense Refill  . ferrous sulfate 325 (65 FE) MG tablet Take 325 mg by mouth 3 (three) times daily with meals.     Marland Kitchen  furosemide (LASIX) 20 MG tablet Take 1 tablet (20 mg total) by mouth daily as needed for fluid. 30 tablet 3  . hydroxychloroquine (PLAQUENIL) 200 MG tablet Take 200 mg by mouth 2 (two) times daily.     Marland Kitchen ibuprofen (ADVIL,MOTRIN) 600 MG tablet 1  po  pc every 6 hours for 5 days then as needed for pain 30 tablet 1  . lisinopril (PRINIVIL,ZESTRIL) 20 MG tablet Take 1 tablet (20 mg total) by mouth daily. 30 tablet 3  . oxyCODONE-acetaminophen (PERCOCET/ROXICET) 5-325 MG per tablet Take 1-2 tablets by mouth every 4 (four) hours as needed for severe pain (moderate to severe pain (when tolerating fluids)). 30 tablet 0  . potassium chloride SA (K-DUR,KLOR-CON) 20 MEQ tablet Take 20 mEq by mouth at bedtime.    . SUMAtriptan (IMITREX) 50 MG tablet Take 1 tablet (50 mg total) by mouth every 2 (two) hours as needed for migraine. May repeat in 2 hours if headache persists or recurs. 10 tablet 0  . TRAVATAN Z 0.004 % SOLN ophthalmic solution Place 1 drop into both eyes At bedtime.     No current facility-administered medications on  file prior to visit.    BP 162/90 mmHg  Pulse 80  Temp(Src) 99.2 F (37.3 C) (Oral)  Resp 16  Ht 5\' 1"  (1.549 m)  Wt 197 lb 12.8 oz (89.721 kg)  BMI 37.39 kg/m2  SpO2 100%  LMP 11/10/2014       Objective:   Physical Exam  Constitutional: She is oriented to person, place, and time. She appears well-developed and well-nourished.  HENT:  Head: Normocephalic and atraumatic.  Cardiovascular: Normal rate, regular rhythm and normal heart sounds.   No murmur heard. Pulmonary/Chest: Effort normal and breath sounds normal. No respiratory distress. She has no wheezes.  Musculoskeletal: She exhibits no edema.  Neurological: She is alert and oriented to person, place, and time.  Skin: Skin is warm and dry.  Psychiatric: She has a normal mood and affect. Her behavior is normal. Judgment and thought content normal.          Assessment & Plan:

## 2014-12-03 NOTE — Assessment & Plan Note (Signed)
Uncontrolled, likely contributing to HA.  Add toprol xl.  Follow up in 1 month for BP recheck.

## 2014-12-03 NOTE — Assessment & Plan Note (Signed)
A1C is normal.  Monitor.

## 2014-12-03 NOTE — Progress Notes (Signed)
Pre visit review using our clinic review tool, if applicable. No additional management support is needed unless otherwise documented below in the visit note. 

## 2014-12-05 ENCOUNTER — Encounter: Payer: Self-pay | Admitting: Family

## 2015-01-02 LAB — HM PAP SMEAR: HM PAP: NORMAL

## 2015-01-21 ENCOUNTER — Telehealth: Payer: Self-pay | Admitting: Oncology

## 2015-01-21 ENCOUNTER — Other Ambulatory Visit (HOSPITAL_BASED_OUTPATIENT_CLINIC_OR_DEPARTMENT_OTHER): Payer: BLUE CROSS/BLUE SHIELD

## 2015-01-21 ENCOUNTER — Ambulatory Visit (HOSPITAL_BASED_OUTPATIENT_CLINIC_OR_DEPARTMENT_OTHER): Payer: BLUE CROSS/BLUE SHIELD | Admitting: Oncology

## 2015-01-21 VITALS — BP 156/98 | HR 93 | Temp 98.1°F | Resp 17 | Ht 61.0 in | Wt 201.5 lb

## 2015-01-21 DIAGNOSIS — D72819 Decreased white blood cell count, unspecified: Secondary | ICD-10-CM | POA: Diagnosis not present

## 2015-01-21 DIAGNOSIS — N924 Excessive bleeding in the premenopausal period: Secondary | ICD-10-CM

## 2015-01-21 DIAGNOSIS — D509 Iron deficiency anemia, unspecified: Secondary | ICD-10-CM | POA: Diagnosis not present

## 2015-01-21 DIAGNOSIS — D500 Iron deficiency anemia secondary to blood loss (chronic): Secondary | ICD-10-CM

## 2015-01-21 LAB — COMPREHENSIVE METABOLIC PANEL
ALBUMIN: 3.5 g/dL (ref 3.5–5.0)
ALK PHOS: 54 U/L (ref 40–150)
ALT: 15 U/L (ref 0–55)
AST: 17 U/L (ref 5–34)
Anion Gap: 5 mEq/L (ref 3–11)
BUN: 7 mg/dL (ref 7.0–26.0)
CO2: 25 meq/L (ref 22–29)
Calcium: 9.4 mg/dL (ref 8.4–10.4)
Chloride: 108 mEq/L (ref 98–109)
Creatinine: 0.8 mg/dL (ref 0.6–1.1)
GLUCOSE: 128 mg/dL (ref 70–140)
POTASSIUM: 3.9 meq/L (ref 3.5–5.1)
SODIUM: 138 meq/L (ref 136–145)
Total Bilirubin: 0.55 mg/dL (ref 0.20–1.20)
Total Protein: 9.2 g/dL — ABNORMAL HIGH (ref 6.4–8.3)

## 2015-01-21 LAB — CBC WITH DIFFERENTIAL/PLATELET
BASO%: 0.7 % (ref 0.0–2.0)
Basophils Absolute: 0 10*3/uL (ref 0.0–0.1)
EOS%: 5.3 % (ref 0.0–7.0)
Eosinophils Absolute: 0.1 10*3/uL (ref 0.0–0.5)
HCT: 35.6 % (ref 34.8–46.6)
HEMOGLOBIN: 11 g/dL — AB (ref 11.6–15.9)
LYMPH%: 43.6 % (ref 14.0–49.7)
MCH: 24.9 pg — ABNORMAL LOW (ref 25.1–34.0)
MCHC: 30.9 g/dL — ABNORMAL LOW (ref 31.5–36.0)
MCV: 80.7 fL (ref 79.5–101.0)
MONO#: 0.3 10*3/uL (ref 0.1–0.9)
MONO%: 10.8 % (ref 0.0–14.0)
NEUT%: 39.6 % (ref 38.4–76.8)
NEUTROS ABS: 1.1 10*3/uL — AB (ref 1.5–6.5)
Platelets: 231 10*3/uL (ref 145–400)
RBC: 4.4 10*6/uL (ref 3.70–5.45)
RDW: 18 % — AB (ref 11.2–14.5)
WBC: 2.8 10*3/uL — AB (ref 3.9–10.3)
lymph#: 1.2 10*3/uL (ref 0.9–3.3)

## 2015-01-21 LAB — FERRITIN: Ferritin: 29 ng/ml (ref 9–269)

## 2015-01-21 LAB — IRON AND TIBC
%SAT: 12 % — ABNORMAL LOW (ref 21–57)
Iron: 39 ug/dL — ABNORMAL LOW (ref 41–142)
TIBC: 324 ug/dL (ref 236–444)
UIBC: 285 ug/dL (ref 120–384)

## 2015-01-21 NOTE — Telephone Encounter (Signed)
Gave patient avs report and appointments for May  °

## 2015-01-21 NOTE — Progress Notes (Signed)
Hematology and Oncology Follow Up Visit  Amber [name] [id number] [birth year] 55 y.o. 01/21/2015 10:04 AM   Principle Diagnosis: 55 year old woman with iron deficiency anemia due to menorrhagia and uterine fibroids. 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  Prior Therapy:  She is status post IV iron infusion in the form of Feraheme given on 05/05/2012, 10/2012 and 09/2013.  The most recent of which done in June 2016.  Current therapy: Oral iron supplements on a daily basis (325 mg by mouth 3 times a day).   Interim History: Amber [name] presents today for a followup visit. Since the last visit, she reports no recent complaints. She reports that her menstrual cycles have been more regular without any menorrhagia. Continues to be on oral iron without any major complications. She has not reported any recent bleeding such as hematochezia, melena or genitourinary bleeding. She continues to be active and have not had any decline in her health or performance status. He continues to work full-time without any decline.  She is tolerating her oral iron without any side effects. She denied dyspepsia or constipation. He does report arthralgias and myalgias but has been chronic in nature.  She has not reported any chest pain or shortness of breath. Has not reported any other bleeding such as GI bleeding or GU bleeding.  He is not reporting any headaches or blurry vision or double vision. She has not reported any hematochezia or melena. He has not reported any recurrent infections. She does not report any constitutional symptoms of fevers chills or sweats. She does not report any frequency urgency or hesitancy. She does not report any lymphadenopathy or petechiae. Remainder or view of system is unremarkable.  Medications: I have reviewed the patient's current medications.  Current Outpatient Prescriptions  Medication Sig Dispense Refill  . ferrous sulfate 325 (65 FE)  MG tablet Take 325 mg by mouth 3 (three) times daily with meals.     . furosemide (LASIX) 20 MG tablet Take 1 tablet (20 mg total) by mouth daily as needed for fluid. 30 tablet 3  . hydroxychloroquine (PLAQUENIL) 200 MG tablet Take 200 mg by mouth 2 (two) times daily.     Marland Kitchen ibuprofen (ADVIL,MOTRIN) 600 MG tablet 1  po  pc every 6 hours for 5 days then as needed for pain 30 tablet 1  . lisinopril (PRINIVIL,ZESTRIL) 20 MG tablet Take 1 tablet (20 mg total) by mouth daily. 30 tablet 3  . metoprolol succinate (TOPROL-XL) 50 MG 24 hr tablet Take 1 tablet (50 mg total) by mouth daily. Take with or immediately following a meal. 30 tablet 3  . oxyCODONE-acetaminophen (PERCOCET/ROXICET) 5-325 MG per tablet Take 1-2 tablets by mouth every 4 (four) hours as needed for severe pain (moderate to severe pain (when tolerating fluids)). 30 tablet 0  . potassium chloride SA (K-DUR,KLOR-CON) 20 MEQ tablet Take 20 mEq by mouth at bedtime.    . SUMAtriptan (IMITREX) 50 MG tablet Take 1 tablet (50 mg total) by mouth every 2 (two) hours as needed for migraine. May repeat in 2 hours if headache persists or recurs. 10 tablet 0  . TRAVATAN Z 0.004 % SOLN ophthalmic solution Place 1 drop into both eyes At bedtime.     No current facility-administered medications for this visit.     Allergies: No Known Allergies    Physical Exam: Blood pressure 156/98, pulse 93, temperature 98.1 F (36.7 C), temperature source Oral, resp. rate 17, height 5' 1"  (  1.549 m), weight 201 lb 8 oz (91.4 kg), SpO2 100 %. ECOG: 0 General appearance: alert awake appeared well without distress.  Head: Normocephalic. Oral mucosa is moist and pink without oral thrush.  Neck: no adenopathy or masses. No thyromegaly. Lymph nodes: Cervical, supraclavicular, and axillary nodes normal. Heart:regular rate and rhythm, S1, S2 normal, no murmur, click, rub or gallop Lung:chest clear, no wheezing, rales, normal symmetric air entry no dullness to  percussion. Abdomen: soft without masses or organomegaly no shifting dullness or ascites.  EXT:no erythema, or edema.   Lab Results: Lab Results  Component Value Date   WBC 2.8* 01/21/2015   HGB 11.0* 01/21/2015   HCT 35.6 01/21/2015   MCV 80.7 01/21/2015   PLT 231 01/21/2015     Chemistry      Component Value Date/Time   NA 139 12/03/2014 0830   NA 139 09/23/2014 0845   K 3.9 12/03/2014 0830   K 4.2 09/23/2014 0845   CL 106 12/03/2014 0830   CL 108* 06/25/2012 1508   CO2 28 12/03/2014 0830   CO2 25 09/23/2014 0845   BUN 9 12/03/2014 0830   BUN 13.5 09/23/2014 0845   CREATININE 0.62 12/03/2014 0830   CREATININE 0.8 09/23/2014 0845   CREATININE 0.73 11/25/2012 0846      Component Value Date/Time   CALCIUM 9.6 12/03/2014 0830   CALCIUM 9.6 09/23/2014 0845   ALKPHOS 50 09/23/2014 0845   ALKPHOS 43 07/21/2014 1032   AST 15 09/23/2014 0845   AST 16 07/21/2014 1032   ALT 14 09/23/2014 0845   ALT 15 07/21/2014 1032   BILITOT 0.51 09/23/2014 0845   BILITOT 0.5 07/21/2014 1032       Impression and Plan:  55 year old woman with the following issues:  1. Iron deficiency anemia due to menorrhagia: She is status post IV iron on June 2016 and currently on oral iron. Hemoglobin remains stable with adequate iron studies. The plan is to continue with oral iron and supplement with IV iron if needed to. She is reporting her menstrual cycles to be more regular which will help her iron losses in the future.  2. Leukocytopenia: Bone marrow biopsy done on 09/09/2013 did not show any evidence of dysplasia or infiltrative bone marrow process. There is no evidence of any lymphoproliferative disease. Absolute neutrophil count appear adequate at this time. These findings are likely reactive and benign in nature. No intervention is needed at this time.  3. Followup will be in 4 months.   SHADAD,FIRAS 1/20/201710:04 AM

## 2015-01-21 NOTE — Addendum Note (Signed)
Addended by: Randolm Idol on: 01/21/2015 10:24 AM   Modules accepted: Medications

## 2015-03-22 ENCOUNTER — Other Ambulatory Visit: Payer: Self-pay | Admitting: Medical

## 2015-03-23 ENCOUNTER — Telehealth: Payer: Self-pay | Admitting: *Deleted

## 2015-03-23 MED ORDER — SUMATRIPTAN SUCCINATE 50 MG PO TABS
50.0000 mg | ORAL_TABLET | ORAL | Status: DC | PRN
Start: 2015-03-23 — End: 2015-12-09

## 2015-03-23 NOTE — Telephone Encounter (Signed)
Received request from [facility] for imitrex refill. 30 day supply sent. Pt last seen by PCP 12/2014 and advised f/u in 4 weeks for blood pressure. Pt is past due.  Please call pt to arrange appt with [name] soon.  Thanks!

## 2015-03-25 NOTE — Telephone Encounter (Signed)
Pt scheduled  

## 2015-03-30 ENCOUNTER — Encounter: Payer: Self-pay | Admitting: Family

## 2015-03-30 ENCOUNTER — Ambulatory Visit (INDEPENDENT_AMBULATORY_CARE_PROVIDER_SITE_OTHER): Payer: BLUE CROSS/BLUE SHIELD | Admitting: Family

## 2015-03-30 VITALS — BP 148/90 | HR 71 | Temp 98.2°F | Resp 16 | Ht 61.0 in | Wt 200.8 lb

## 2015-03-30 DIAGNOSIS — R002 Palpitations: Secondary | ICD-10-CM

## 2015-03-30 DIAGNOSIS — Z0000 Encounter for general adult medical examination without abnormal findings: Secondary | ICD-10-CM

## 2015-03-30 DIAGNOSIS — R7303 Prediabetes: Secondary | ICD-10-CM | POA: Diagnosis not present

## 2015-03-30 DIAGNOSIS — G43009 Migraine without aura, not intractable, without status migrainosus: Secondary | ICD-10-CM

## 2015-03-30 DIAGNOSIS — I10 Essential (primary) hypertension: Secondary | ICD-10-CM

## 2015-03-30 LAB — CBC WITH DIFFERENTIAL/PLATELET
BASOS PCT: 0.4 % (ref 0.0–3.0)
Basophils Absolute: 0 10*3/uL (ref 0.0–0.1)
EOS ABS: 0.1 10*3/uL (ref 0.0–0.7)
EOS PCT: 3.3 % (ref 0.0–5.0)
HEMATOCRIT: 36.2 % (ref 36.0–46.0)
HEMOGLOBIN: 11.4 g/dL — AB (ref 12.0–15.0)
LYMPHS PCT: 52.9 % — AB (ref 12.0–46.0)
Lymphs Abs: 1.3 10*3/uL (ref 0.7–4.0)
MCHC: 31.5 g/dL (ref 30.0–36.0)
MCV: 81.3 fl (ref 78.0–100.0)
Monocytes Absolute: 0.2 10*3/uL (ref 0.1–1.0)
Monocytes Relative: 7.9 % (ref 3.0–12.0)
Neutro Abs: 0.9 10*3/uL — ABNORMAL LOW (ref 1.4–7.7)
Neutrophils Relative %: 35.5 % — ABNORMAL LOW (ref 43.0–77.0)
Platelets: 209 10*3/uL (ref 150.0–400.0)
RBC: 4.45 Mil/uL (ref 3.87–5.11)
RDW: 15.6 % — AB (ref 11.5–15.5)

## 2015-03-30 LAB — BASIC METABOLIC PANEL
BUN: 13 mg/dL (ref 6–23)
CO2: 30 mEq/L (ref 19–32)
Calcium: 9.5 mg/dL (ref 8.4–10.5)
Chloride: 105 mEq/L (ref 96–112)
Creatinine, Ser: 0.65 mg/dL (ref 0.40–1.20)
GFR: 122.27 mL/min (ref >60.00)
GLUCOSE: 95 mg/dL (ref 70–99)
POTASSIUM: 3.7 meq/L (ref 3.5–5.1)
Sodium: 140 mEq/L (ref 135–145)

## 2015-03-30 LAB — TSH: TSH: 1.42 u[IU]/mL (ref 0.35–4.50)

## 2015-03-30 MED ORDER — METOPROLOL SUCCINATE ER 50 MG PO TB24: 50.0000 mg | ORAL_TABLET | Freq: Every day | ORAL | Status: DC

## 2015-03-30 MED ORDER — LISINOPRIL 20 MG PO TABS: 20.0000 mg | ORAL_TABLET | Freq: Every day | ORAL | Status: DC

## 2015-03-30 NOTE — Assessment & Plan Note (Signed)
Uncontrolled. We discussed addition of amitriptyline or migraine prophylaxis, however she does not wish to add additional medication at this time. Continue beta blocker, prn imitrex, monitor.

## 2015-03-30 NOTE — Addendum Note (Signed)
Addended by: [name] on: 03/30/2015 07:47 AM   Modules accepted: Miquel Dunn

## 2015-03-30 NOTE — Progress Notes (Signed)
Pre visit review using our clinic review tool, if applicable. No additional management support is needed unless otherwise documented below in the visit note. 

## 2015-03-30 NOTE — Progress Notes (Signed)
Subjective:    Patient ID: Amber [name], female    DOB: 1960-01-23, 55 y.o.   MRN: [id number]  HPI   Amber [name] is a 55 yr old female who presents today for follow up.  1) HTN- reports that she has not yet taking AM meds today.  Uses lasix 3x a week. She is also maintained on metoprolol and lisinopril.   BP Readings from Last 3 Encounters:  03/30/15 148/90  01/21/15 156/98  12/03/14 162/90   2) Hyperglycemia-  Lab Results  Component Value Date   HGBA1C 5.0 08/31/2014   3) Migraine-  Maintained on prn imitrex. Using imitrex 2 x a week. Notes increased frequency.  Has increased work stress recently.   Review of Systems  Constitutional: Positive for fatigue.  Cardiovascular: Positive for palpitations.       Attributes palpitations to stress.    See HPI  Past Medical History  Diagnosis Date  . SAB (spontaneous abortion)   . Elective abortion   . Ectopic pregnancy     RIGHT salpingostomy  . Hypertension   . GERD (gastroesophageal reflux disease)   . Anemia   . Sjogren's disease ([place])   . Glaucoma     both eyes  . Rheumatoid arthritis(714.0) 11/07/2011  . History of chicken pox   . Headache     migraines    Social History   Social History  . Marital Status: Single    Spouse Name: N/A  . Number of Children: 0  . Years of Education: N/A   Occupational History  .     Social History Main Topics  . Smoking status: Never Smoker   . Smokeless tobacco: Never Used  . Alcohol Use: No  . Drug Use: No  . Sexual Activity: Not on file     Comment: 1st intercourse- 17, partners- 5   Other Topics Concern  . Not on file   Social History Narrative   Regular exercise:  No   Caffeine Use: 2 cups coffee daily   No children   "Happily divorced"   Reports that she is involved at her church   Works with intellectually challenged adults   Born [place]- she came to [place] as adult.  Age 2.              Past Surgical History  Procedure Laterality Date  .  Dilation and curettage of uterus      X 2  . Knee surgery      MENISCUS TEAR REPAIR  . Pelvic laparoscopy  1994    IN 1999 LAPAROSCOPY WITH INCIDENTAL ENTEROTOMY REQUIRING LAPAROTOMY  . Abdominal surgery      bowel repair  . Colonoscopy    . Myomectomy N/A 07/27/2014    Procedure: ABDOMINAL MYOMECTOMY;  Surgeon: Waymon Amato, MD;  Location: [facility];  Service: Gynecology;  Laterality: N/A;    Family History  Problem Relation Age of Onset  . Hypertension Mother     died from heart disease  . Heart disease Mother     deceased, unknown cause  . Diabetes Sister   . Hypertension Sister   . Diabetes Sister     No Known Allergies  Current Outpatient Prescriptions on File Prior to Visit  Medication Sig Dispense Refill  . ferrous sulfate 325 (65 FE) MG tablet Take 325 mg by mouth 3 (three) times daily with meals.     . furosemide (LASIX) 20 MG tablet Take 1 tablet (20 mg total) by mouth  daily as needed for fluid. 30 tablet 3  . hydroxychloroquine (PLAQUENIL) 200 MG tablet Take 200 mg by mouth 2 (two) times daily.     Marland Kitchen ibuprofen (ADVIL,MOTRIN) 600 MG tablet 1  po  pc every 6 hours for 5 days then as needed for pain 30 tablet 1  . lisinopril (PRINIVIL,ZESTRIL) 20 MG tablet Take 1 tablet (20 mg total) by mouth daily. 30 tablet 3  . metoprolol succinate (TOPROL-XL) 50 MG 24 hr tablet Take 1 tablet (50 mg total) by mouth daily. Take with or immediately following a meal. 30 tablet 3  . potassium chloride SA (K-DUR,KLOR-CON) 20 MEQ tablet Take 20 mEq by mouth at bedtime.    . SUMAtriptan (IMITREX) 50 MG tablet Take 1 tablet (50 mg total) by mouth every 2 (two) hours as needed for migraine. May repeat in 2 hours if headache persists or recurs. 10 tablet 0  . TRAVATAN Z 0.004 % SOLN ophthalmic solution Place 1 drop into both eyes At bedtime.     No current facility-administered medications on file prior to visit.    BP 148/90 mmHg  Pulse 71  Temp(Src) 98.2 F (36.8 C) (Oral)  Resp 16  Ht 5\' 1"   (1.549 m)  Wt 200 lb 12.8 oz (91.082 kg)  BMI 37.96 kg/m2  SpO2 98%  LMP 01/06/2015       Objective:   Physical Exam  Constitutional: She appears well-developed and well-nourished.  Cardiovascular: Normal rate, regular rhythm and normal heart sounds.   No murmur heard. Pulmonary/Chest: Effort normal and breath sounds normal. No respiratory distress. She has no wheezes.  Musculoskeletal:  2+ bilateral LE edema.   Psychiatric: She has a normal mood and affect. Her behavior is normal. Judgment and thought content normal.          Assessment & Plan:  Palpitations-  EKG shows NSR. Will obtain bmet, TSH, CBC.  Advised pt to let us know if palpitations become more frequent.

## 2015-03-30 NOTE — Assessment & Plan Note (Signed)
BP is up a bit today. Plan to recheck in 3 months

## 2015-03-30 NOTE — Assessment & Plan Note (Signed)
Last A1C was ok, monitor.

## 2015-03-30 NOTE — Patient Instructions (Addendum)
Please complete lab work prior to leaving.  Call if palpitations become more frequent.   Follow up in 3 months.

## 2015-05-13 ENCOUNTER — Ambulatory Visit: Payer: BLUE CROSS/BLUE SHIELD | Admitting: Oncology

## 2015-05-13 ENCOUNTER — Other Ambulatory Visit: Payer: BLUE CROSS/BLUE SHIELD

## 2015-05-18 ENCOUNTER — Telehealth: Payer: Self-pay | Admitting: Oncology

## 2015-05-18 NOTE — Telephone Encounter (Signed)
s.w. pt and r/s missed appt....pt ok and aware of new d.t °

## 2015-05-19 ENCOUNTER — Ambulatory Visit (HOSPITAL_BASED_OUTPATIENT_CLINIC_OR_DEPARTMENT_OTHER): Payer: BLUE CROSS/BLUE SHIELD | Admitting: Oncology

## 2015-05-19 ENCOUNTER — Telehealth: Payer: Self-pay | Admitting: Oncology

## 2015-05-19 ENCOUNTER — Other Ambulatory Visit (HOSPITAL_BASED_OUTPATIENT_CLINIC_OR_DEPARTMENT_OTHER): Payer: BLUE CROSS/BLUE SHIELD

## 2015-05-19 VITALS — BP 168/93 | HR 78 | Temp 98.4°F | Resp 18 | Ht 61.0 in | Wt 201.1 lb

## 2015-05-19 DIAGNOSIS — D500 Iron deficiency anemia secondary to blood loss (chronic): Secondary | ICD-10-CM

## 2015-05-19 DIAGNOSIS — D72819 Decreased white blood cell count, unspecified: Secondary | ICD-10-CM | POA: Diagnosis not present

## 2015-05-19 DIAGNOSIS — N924 Excessive bleeding in the premenopausal period: Secondary | ICD-10-CM

## 2015-05-19 LAB — CBC WITH DIFFERENTIAL/PLATELET
BASO%: 0.8 % (ref 0.0–2.0)
Basophils Absolute: 0 10*3/uL (ref 0.0–0.1)
EOS%: 3.3 % (ref 0.0–7.0)
Eosinophils Absolute: 0.1 10*3/uL (ref 0.0–0.5)
HEMATOCRIT: 38.5 % (ref 34.8–46.6)
HEMOGLOBIN: 12 g/dL (ref 11.6–15.9)
LYMPH#: 1.1 10*3/uL (ref 0.9–3.3)
LYMPH%: 48.8 % (ref 14.0–49.7)
MCH: 25.6 pg (ref 25.1–34.0)
MCHC: 31.1 g/dL — ABNORMAL LOW (ref 31.5–36.0)
MCV: 82.3 fL (ref 79.5–101.0)
MONO#: 0.2 10*3/uL (ref 0.1–0.9)
MONO%: 8.8 % (ref 0.0–14.0)
NEUT%: 38.3 % — AB (ref 38.4–76.8)
NEUTROS ABS: 0.9 10*3/uL — AB (ref 1.5–6.5)
PLATELETS: 202 10*3/uL (ref 145–400)
RBC: 4.67 10*6/uL (ref 3.70–5.45)
RDW: 14.4 % (ref 11.2–14.5)
WBC: 2.2 10*3/uL — AB (ref 3.9–10.3)

## 2015-05-19 NOTE — Progress Notes (Signed)
Hematology and Oncology Follow Up Visit  Amber [name] [id number] 03-14-1960 55 y.o. 05/19/2015 10:35 AM   Principle Diagnosis: 55 year old woman with iron deficiency anemia due to menorrhagia and uterine fibroids. 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  Prior Therapy:  She is status post IV iron infusion in the form of Feraheme given on 05/05/2012, 10/2012 and 09/2013.  The most recent of which done in June 2016.  Current therapy: Oral iron supplements on a daily basis (325 mg by mouth once a day).  Interim History: Amber [name] presents today for a followup visit. Since the last visit, she reports no changes in her health. She have reported increased stress in her work as well as her family with her sister diagnosed with advanced cancer. She had to travel and be with her on multiple occasions which caused her to be more fatigued and stressed.   She reports that her menstrual cycles have been decreasing gradually and no longer reporting menorrhagia. She reports occasional spotting every other month. She continues to be on oral iron without any major complications. She denied any dyspepsia or constipation. She has not reported any recent bleeding such as hematochezia, melena or genitourinary bleeding. She continues to perform activities of daily living as mentioned and continues to work full time.    She does not report any headaches, blurry vision, syncope or seizures. She has not reported any chest pain or shortness of breath. Has not reported any other bleeding such as GI bleeding or GU bleeding. She has not reported any hematochezia or melena. He has not reported any recurrent infections. She does not report any constitutional symptoms of fevers chills or sweats. She does not report any frequency urgency or hesitancy. She does not report any lymphadenopathy or petechiae. Remainder or view of system is unremarkable.  Medications: I have reviewed  the patient's current medications.  Current Outpatient Prescriptions  Medication Sig Dispense Refill  . ferrous sulfate 325 (65 FE) MG tablet Take 325 mg by mouth 3 (three) times daily with meals.     . furosemide (LASIX) 20 MG tablet Take 1 tablet (20 mg total) by mouth daily as needed for fluid. 30 tablet 3  . hydroxychloroquine (PLAQUENIL) 200 MG tablet Take 200 mg by mouth 2 (two) times daily.     Marland Kitchen ibuprofen (ADVIL,MOTRIN) 600 MG tablet 1  po  pc every 6 hours for 5 days then as needed for pain 30 tablet 1  . lisinopril (PRINIVIL,ZESTRIL) 20 MG tablet Take 1 tablet (20 mg total) by mouth daily. 30 tablet 5  . metoprolol succinate (TOPROL-XL) 50 MG 24 hr tablet Take 1 tablet (50 mg total) by mouth daily. Take with or immediately following a meal. 30 tablet 5  . potassium chloride SA (K-DUR,KLOR-CON) 20 MEQ tablet Take 20 mEq by mouth at bedtime.    . SUMAtriptan (IMITREX) 50 MG tablet Take 1 tablet (50 mg total) by mouth every 2 (two) hours as needed for migraine. May repeat in 2 hours if headache persists or recurs. 10 tablet 0  . TRAVATAN Z 0.004 % SOLN ophthalmic solution Place 1 drop into both eyes At bedtime.     No current facility-administered medications for this visit.     Allergies: No Known Allergies    Physical Exam: Blood pressure 168/93, pulse 78, temperature 98.4 F (36.9 C), temperature source Oral, resp. rate 18, height _0  (1.549 m), weight 201 lb 1.6 oz (91.218 kg), SpO2 100 %.  ECOG: 0 General appearance: Alert, awake woman without distress.  Head: Normocephalic. Oral mucosa without thrush. Neck: no adenopathy or masses. No thyromegaly. Lymph nodes: Cervical, supraclavicular, and axillary nodes normal. Heart:regular rate and rhythm, S1, S2 normal, no murmur, click, rub or gallop Lung:chest clear, no wheezing, rales, normal symmetric air entry no dullness to percussion. Abdomen: soft without masses or organomegaly no rebound or guarding. EXT:no erythema, or  edema.   Lab Results: Lab Results  Component Value Date   WBC 2.2* 05/19/2015   HGB 12.0 05/19/2015   HCT 38.5 05/19/2015   MCV 82.3 05/19/2015   PLT 202 05/19/2015     Chemistry      Component Value Date/Time   NA 140 03/30/2015 0744   NA 138 01/21/2015 0935   K 3.7 03/30/2015 0744   K 3.9 01/21/2015 0935   CL 105 03/30/2015 0744   CL 108* 06/25/2012 1508   CO2 30 03/30/2015 0744   CO2 25 01/21/2015 0935   BUN 13 03/30/2015 0744   BUN 7.0 01/21/2015 0935   CREATININE 0.65 03/30/2015 0744   CREATININE 0.8 01/21/2015 0935   CREATININE 0.73 11/25/2012 0846      Component Value Date/Time   CALCIUM 9.5 03/30/2015 0744   CALCIUM 9.4 01/21/2015 0935   ALKPHOS 54 01/21/2015 0935   ALKPHOS 43 07/21/2014 1032   AST 17 01/21/2015 0935   AST 16 07/21/2014 1032   ALT 15 01/21/2015 0935   ALT 15 07/21/2014 1032   BILITOT 0.55 01/21/2015 0935   BILITOT 0.5 07/21/2014 1032       Impression and Plan:  55 year old woman with the following issues:  1. Iron deficiency anemia due to menorrhagia: She is status post IV iron on June 2016 and currently on oral iron. Hemoglobin is normal today at 12.0. I studies are currently pending but her MCV is normal and RDW is also normal. I recommended continuing oral iron for the time being without any further need for intravenous iron.  2. Leukocytopenia: Bone marrow biopsy done on 09/09/2013 did not show any evidence of dysplasia or infiltrative bone marrow process. There is no evidence of any lymphoproliferative disease. Absolute neutrophil count is around 900 without any recurrent infections. 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  3. Rheumatoid arthritis: She currently on Plaquenil and seems to be adequately controlling her disease.  4. Followup will be in 6 months.   SVXBLT,JQZES 5/18/201710:35 AM

## 2015-05-19 NOTE — Telephone Encounter (Signed)
Gave and printed appt sched and avs for NOV °

## 2015-05-19 NOTE — Telephone Encounter (Signed)
pt cld to get time & date of appt for 5/18 adv 10:00

## 2015-05-20 LAB — IRON AND TIBC CHCC
IRON SATURATION: 16 % (ref 15–55)
Iron: 60 ug/dL (ref 27–159)
Total Iron Binding Capacity: 380 ug/dL (ref 250–450)
UIBC: 320 ug/dL (ref 131–425)

## 2015-05-20 LAB — FERRITIN CHCC: FERRITIN: 20 ng/mL (ref 15–150)

## 2015-08-05 ENCOUNTER — Ambulatory Visit: Payer: BLUE CROSS/BLUE SHIELD | Admitting: Family Medicine

## 2015-08-06 ENCOUNTER — Emergency Department (HOSPITAL_COMMUNITY)
Admission: EM | Admit: 2015-08-06 | Discharge: 2015-08-06 | Disposition: A | Discharge status: Home / Self Care | Payer: BLUE CROSS/BLUE SHIELD | Attending: Emergency Medicine | Admitting: Emergency Medicine

## 2015-08-06 ENCOUNTER — Ambulatory Visit (INDEPENDENT_AMBULATORY_CARE_PROVIDER_SITE_OTHER): Payer: BLUE CROSS/BLUE SHIELD | Admitting: Family Medicine

## 2015-08-06 ENCOUNTER — Encounter (HOSPITAL_COMMUNITY): Payer: Self-pay | Admitting: Emergency Medicine

## 2015-08-06 ENCOUNTER — Encounter: Payer: Self-pay | Admitting: Family Medicine

## 2015-08-06 ENCOUNTER — Emergency Department (HOSPITAL_COMMUNITY): Payer: BLUE CROSS/BLUE SHIELD

## 2015-08-06 VITALS — BP 144/80 | HR 84 | Temp 98.2°F | Wt 201.0 lb

## 2015-08-06 DIAGNOSIS — R1084 Generalized abdominal pain: Secondary | ICD-10-CM

## 2015-08-06 DIAGNOSIS — I10 Essential (primary) hypertension: Secondary | ICD-10-CM | POA: Insufficient documentation

## 2015-08-06 DIAGNOSIS — R509 Fever, unspecified: Secondary | ICD-10-CM | POA: Insufficient documentation

## 2015-08-06 DIAGNOSIS — Z791 Long term (current) use of non-steroidal anti-inflammatories (NSAID): Secondary | ICD-10-CM | POA: Insufficient documentation

## 2015-08-06 DIAGNOSIS — Z79899 Other long term (current) drug therapy: Secondary | ICD-10-CM | POA: Diagnosis not present

## 2015-08-06 DIAGNOSIS — K5900 Constipation, unspecified: Secondary | ICD-10-CM | POA: Insufficient documentation

## 2015-08-06 DIAGNOSIS — N939 Abnormal uterine and vaginal bleeding, unspecified: Secondary | ICD-10-CM | POA: Diagnosis not present

## 2015-08-06 DIAGNOSIS — R110 Nausea: Secondary | ICD-10-CM | POA: Diagnosis not present

## 2015-08-06 LAB — I-STAT BETA HCG BLOOD, ED (MC, WL, AP ONLY)

## 2015-08-06 LAB — WET PREP, GENITAL
Clue Cells Wet Prep HPF POC: NONE SEEN
SPERM: NONE SEEN
Trich, Wet Prep: NONE SEEN
WBC, Wet Prep HPF POC: NONE SEEN
YEAST WET PREP: NONE SEEN

## 2015-08-06 LAB — URINE MICROSCOPIC-ADD ON: WBC UA: NONE SEEN WBC/hpf (ref 0–5)

## 2015-08-06 LAB — COMPREHENSIVE METABOLIC PANEL
ALBUMIN: 3.7 g/dL (ref 3.5–5.0)
ALK PHOS: 49 U/L (ref 38–126)
ALT: 18 U/L (ref 14–54)
AST: 17 U/L (ref 15–41)
Anion gap: 7 (ref 5–15)
BUN: 12 mg/dL (ref 6–20)
CALCIUM: 9 mg/dL (ref 8.9–10.3)
CO2: 23 mmol/L (ref 22–32)
CREATININE: 0.68 mg/dL (ref 0.44–1.00)
Chloride: 106 mmol/L (ref 101–111)
GFR calc non Af Amer: >60 mL/min (ref >60)
GLUCOSE: 102 mg/dL — AB (ref 65–99)
Potassium: 3.1 mmol/L — ABNORMAL LOW (ref 3.5–5.1)
SODIUM: 136 mmol/L (ref 135–145)
Total Bilirubin: 1.3 mg/dL — ABNORMAL HIGH (ref 0.3–1.2)
Total Protein: 9.4 g/dL — ABNORMAL HIGH (ref 6.5–8.1)

## 2015-08-06 LAB — CBC
HCT: 35.9 % — ABNORMAL LOW (ref 36.0–46.0)
Hemoglobin: 11.2 g/dL — ABNORMAL LOW (ref 12.0–15.0)
MCH: 25.3 pg — AB (ref 26.0–34.0)
MCHC: 31.2 g/dL (ref 30.0–36.0)
MCV: 81.2 fL (ref 78.0–100.0)
PLATELETS: 271 10*3/uL (ref 150–400)
RBC: 4.42 MIL/uL (ref 3.87–5.11)
RDW: 14.6 % (ref 11.5–15.5)
WBC: 7.3 10*3/uL (ref 4.0–10.5)

## 2015-08-06 LAB — URINALYSIS, ROUTINE W REFLEX MICROSCOPIC
BILIRUBIN URINE: NEGATIVE
Glucose, UA: NEGATIVE mg/dL
Ketones, ur: NEGATIVE mg/dL
Leukocytes, UA: NEGATIVE
Nitrite: NEGATIVE
PROTEIN: 30 mg/dL — AB
Specific Gravity, Urine: 1.024 (ref 1.005–1.030)
pH: 5.5 (ref 5.0–8.0)

## 2015-08-06 LAB — LIPASE, BLOOD: Lipase: 35 U/L (ref 11–51)

## 2015-08-06 MED ORDER — KETOROLAC TROMETHAMINE 30 MG/ML IJ SOLN
30.0000 mg | Freq: Once | INTRAMUSCULAR | Status: AC
  Administered 2015-08-06: 30 mg via INTRAVENOUS
  Filled 2015-08-06: qty 1

## 2015-08-06 MED ORDER — POTASSIUM CHLORIDE ER 10 MEQ PO TBCR: 20.0000 meq | EXTENDED_RELEASE_TABLET | Freq: Every day | ORAL | 0 refills | Status: DC

## 2015-08-06 MED ORDER — PIPERACILLIN-TAZOBACTAM 3.375 G IVPB 30 MIN
3.3750 g | Freq: Once | INTRAVENOUS | Status: AC
  Administered 2015-08-06: 3.375 g via INTRAVENOUS
  Filled 2015-08-06: qty 50

## 2015-08-06 MED ORDER — ONDANSETRON HCL 4 MG/2ML IJ SOLN
4.0000 mg | Freq: Once | INTRAMUSCULAR | Status: AC
  Administered 2015-08-06: 4 mg via INTRAVENOUS
  Filled 2015-08-06: qty 2

## 2015-08-06 MED ORDER — SODIUM CHLORIDE 0.9 % IV BOLUS (SEPSIS)
1000.0000 mL | Freq: Once | INTRAVENOUS | Status: AC
  Administered 2015-08-06: 1000 mL via INTRAVENOUS

## 2015-08-06 MED ORDER — MORPHINE SULFATE (PF) 4 MG/ML IV SOLN
4.0000 mg | Freq: Once | INTRAVENOUS | Status: AC
  Administered 2015-08-06: 4 mg via INTRAVENOUS
  Filled 2015-08-06: qty 1

## 2015-08-06 MED ORDER — PIPERACILLIN-TAZOBACTAM 3.375 G IVPB: 3.3750 g | Freq: Three times a day (TID) | INTRAVENOUS | Status: DC

## 2015-08-06 MED ORDER — IOPAMIDOL (ISOVUE-300) INJECTION 61%
100.0000 mL | Freq: Once | INTRAVENOUS | Status: AC | PRN
  Administered 2015-08-06: 100 mL via INTRAVENOUS

## 2015-08-06 MED ORDER — DIATRIZOATE MEGLUMINE & SODIUM 66-10 % PO SOLN
15.0000 mL | Freq: Once | ORAL | Status: AC
  Administered 2015-08-06: 15 mL via ORAL

## 2015-08-06 NOTE — ED Provider Notes (Addendum)
[facility] DEPT Provider Note   CSN: [id number] Arrival date & time: 08/06/15  1048  First Provider Contact:  None       History   Chief Complaint Chief Complaint  Patient presents with  . Abdominal Pain    HPI Amber [name] is a 55 y.o. female.  HPI Abdominal pain, started Thursday, started to develop low grade fever, then yesterday at [facility] was 102.8, and pain was severe 10/10 or 15/10 constant Generalized abdominal pain, lower part is worse, worse with coughing/sneezing/deep breathing  Nausea No vomiting No diarrhea +constipation today but had BM other days No urinary symptoms Menstruating now, no vaginal discharge  Past Medical History:  Diagnosis Date  . Anemia   . Ectopic pregnancy    RIGHT salpingostomy  . Elective abortion   . GERD (gastroesophageal reflux disease)   . Glaucoma    both eyes  . Headache    migraines  . History of chicken pox   . Hypertension   . Rheumatoid arthritis(714.0) 11/07/2011  . SAB (spontaneous abortion)   . Sjogren's disease [facility])     Patient Active Problem List   Diagnosis Date Noted  . Fibroids 07/27/2014  . Migraine 04/19/2014  . Nail fungus 04/09/2014  . Elevated serum protein level 04/09/2014  . Preventative health care 04/09/2014  . Iron deficiency anemia 09/02/2013  . Headache(784.0) 03/04/2013  . Obesity (BMI 30-39.9) 10/17/2012  . Glaucoma 11/07/2011  . Rheumatoid arthritis ([place]) 11/07/2011  . Borderline diabetes 11/07/2011  . HTN (hypertension) 11/05/2011  . Sjogren's disease ([place]) 11/05/2011  . Anemia 09/22/2010  . Fibroid uterus 09/22/2010  . Neutropenia 09/22/2010  . Menorrhagia 09/15/2010  . Dysmenorrhea 09/15/2010    Past Surgical History:  Procedure Laterality Date  . ABDOMINAL SURGERY     bowel repair  . COLONOSCOPY    . DILATION AND CURETTAGE OF UTERUS     X 2  . KNEE SURGERY     MENISCUS TEAR REPAIR  . MYOMECTOMY N/A 07/27/2014   Procedure: ABDOMINAL MYOMECTOMY;  Surgeon: Waymon Amato, MD;  Location: [facility];  Service: Gynecology;  Laterality: N/A;  . PELVIC LAPAROSCOPY  1994   IN 1999 LAPAROSCOPY WITH INCIDENTAL ENTEROTOMY REQUIRING LAPAROTOMY     OB History    Gravida Para Term Preterm AB Living   3 0     2 0   SAB TAB Ectopic Multiple Live Births       1           Home Medications    Prior to Admission medications   Medication Sig Start Date End Date Taking? Authorizing Provider  ferrous sulfate 325 (65 FE) MG tablet Take 325 mg by mouth 3 (three) times daily with meals.    Yes Historical Provider, MD  furosemide (LASIX) 20 MG tablet Take 1 tablet (20 mg total) by mouth daily as needed for fluid. 12/03/14  Yes [name], NP  hydroxychloroquine (PLAQUENIL) 200 MG tablet Take 200 mg by mouth 2 (two) times daily.    Yes Historical Provider, MD  ibuprofen (ADVIL,MOTRIN) 600 MG tablet 1  po  pc every 6 hours for 5 days then as needed for pain Patient taking differently: Take 600 mg by mouth every 6 (six) hours as needed for mild pain or moderate pain. 1  po  pc every 6 hours for 5 days then as needed for pain 07/29/14  Yes Elmira Powell, PA-C  lisinopril (PRINIVIL,ZESTRIL) 20 MG tablet Take 1 tablet (20 mg total) by mouth  daily. 03/30/15  Yes [name], NP  metoprolol succinate (TOPROL-XL) 50 MG 24 hr tablet Take 1 tablet (50 mg total) by mouth daily. Take with or immediately following a meal. 03/30/15  Yes [name], NP  potassium chloride SA (K-DUR,KLOR-CON) 20 MEQ tablet Take 20 mEq by mouth daily.    Yes Historical Provider, MD  SUMAtriptan (IMITREX) 50 MG tablet Take 1 tablet (50 mg total) by mouth every 2 (two) hours as needed for migraine. May repeat in 2 hours if headache persists or recurs. 03/23/15  Yes [name], NP  TRAVATAN Z 0.004 % SOLN ophthalmic solution Place 1 drop into both eyes at bedtime.  10/25/11  Yes Historical Provider, MD  potassium chloride (K-DUR) 10 MEQ tablet Take 2 tablets (20 mEq total) by mouth daily.  08/06/15 08/09/15  Gareth Morgan, MD    Family History Family History  Problem Relation Age of Onset  . Hypertension Mother     died from heart disease  . Heart disease Mother     deceased, unknown cause  . Diabetes Sister   . Hypertension Sister   . Diabetes Sister     Social History Social History  Substance Use Topics  . Smoking status: Never Smoker  . Smokeless tobacco: Never Used  . Alcohol use No     Allergies   Review of patient's allergies indicates no known allergies.   Review of Systems Review of Systems  Constitutional: Positive for appetite change and fever.  HENT: Negative for sore throat.   Eyes: Negative for visual disturbance.  Respiratory: Negative for cough and shortness of breath.   Cardiovascular: Negative for chest pain.  Gastrointestinal: Positive for abdominal pain, constipation and nausea. Negative for diarrhea and vomiting.  Genitourinary: Positive for vaginal bleeding. Negative for difficulty urinating and vaginal discharge.  Musculoskeletal: Negative for back pain and neck pain.  Skin: Negative for rash.  Neurological: Negative for syncope and headaches.     Physical Exam Updated Vital Signs BP (!) 117/101 (BP Location: Right Arm)   Pulse 67   Temp 98.7 F (37.1 C) (Oral)   Resp 18   Ht 5\' 4"  (1.626 m)   Wt 201 lb (91.2 kg)   LMP 08/05/2015   SpO2 96%   BMI 34.50 kg/m   Physical Exam  Constitutional: She is oriented to person, place, and time. She appears well-developed and well-nourished. No distress.  HENT:  Head: Normocephalic and atraumatic.  Eyes: Conjunctivae and EOM are normal.  Neck: Normal range of motion.  Cardiovascular: Normal rate, regular rhythm, normal heart sounds and intact distal pulses.  Exam reveals no gallop and no friction rub.   No murmur heard. Pulmonary/Chest: Effort normal and breath sounds normal. No respiratory distress. She has no wheezes. She has no rales.  Abdominal: Soft. She exhibits no  distension. There is tenderness (diffuse). There is guarding (diffuse, worse lower abdomen).  Genitourinary: Uterus is tender. Cervix exhibits no motion tenderness. Right adnexum displays tenderness. Left adnexum displays tenderness. There is bleeding in the vagina.  Musculoskeletal: She exhibits no edema or tenderness.  Neurological: She is alert and oriented to person, place, and time.  Skin: Skin is warm and dry. No rash noted. She is not diaphoretic. No erythema.  Nursing note and vitals reviewed.    ED Treatments / Results  Labs (all labs ordered are listed, but only abnormal results are displayed) Labs Reviewed  COMPREHENSIVE METABOLIC PANEL - Abnormal; Notable for the following:       Result  Value   Potassium 3.1 (*)    Glucose, Bld 102 (*)    Total Protein 9.4 (*)    Total Bilirubin 1.3 (*)    All other components within normal limits  CBC - Abnormal; Notable for the following:    Hemoglobin 11.2 (*)    HCT 35.9 (*)    MCH 25.3 (*)    All other components within normal limits  URINALYSIS, ROUTINE W REFLEX MICROSCOPIC (NOT AT [facility]) - Abnormal; Notable for the following:    Hgb urine dipstick TRACE (*)    Protein, ur 30 (*)    All other components within normal limits  URINE MICROSCOPIC-ADD ON - Abnormal; Notable for the following:    Squamous Epithelial / LPF 0-5 (*)    Bacteria, UA FEW (*)    All other components within normal limits  WET PREP, GENITAL  CULTURE, BLOOD (ROUTINE X 2)  CULTURE, BLOOD (ROUTINE X 2)  LIPASE, BLOOD  I-STAT BETA HCG BLOOD, ED ([facility], [facility], [facility] ONLY)  GC/CHLAMYDIA PROBE AMP ([place]) NOT AT [facility]    EKG  EKG Interpretation None       Radiology Ct Abdomen Pelvis W Contrast  Result Date: 08/06/2015 CLINICAL DATA:  55 year old female with complaint of generalize abdominal pain, nausea and fevers for the past 2 days. EXAM: CT ABDOMEN AND PELVIS WITH CONTRAST TECHNIQUE: Multidetector CT imaging of the abdomen and pelvis was performed using  the standard protocol following bolus administration of intravenous contrast. CONTRAST:  136mL ISOVUE-300 IOPAMIDOL (ISOVUE-300) INJECTION 61% COMPARISON:  No priors. FINDINGS: Lower chest: Areas of scarring are noted throughout the lung bases bilaterally. Hepatobiliary: No cystic or solid hepatic lesions. No intra or extrahepatic biliary ductal dilatation. Intermediate attenuation material lying dependently in the gallbladder likely reflects some biliary sludge. No evidence of an acute cholecystitis at this time. Pancreas: No pancreatic mass. No pancreatic ductal dilatation. No pancreatic or peripancreatic fluid or inflammatory changes. Spleen: Unremarkable. Adrenals/Urinary Tract: 1.4 cm left adrenal nodule is indeterminate. Right adrenal gland and bilateral kidneys are normal in appearance. No hydroureteronephrosis. Urinary bladder is normal in appearance. Stomach/Bowel: Stomach is normal in appearance. No pathologic dilatation of small bowel or colon. Normal appendix. Vascular/Lymphatic: No significant atherosclerotic disease, aneurysm or dissection identified in the abdominal or pelvic vasculature. No lymphadenopathy noted in the abdomen or pelvis. Reproductive: Uterus is slightly heterogeneous in appearance, suggesting the presence of multiple tiny fibroids. Ovaries are unremarkable in appearance. Other: No significant volume of ascites.  No pneumoperitoneum. Musculoskeletal: There are no aggressive appearing lytic or blastic lesions noted in the visualized portions of the skeleton. IMPRESSION: 1. No acute findings in the abdomen or pelvis to account for the patient's symptoms. 2. Biliary sludge in the gallbladder, without evidence of acute cholecystitis. 3. Normal appendix. 4. 1.4 cm left adrenal nodule is indeterminate on today's contrast enhanced CT scan, but statistically likely a small adenoma. Consider follow-up 12 month adrenal protocol CT scan. This recommendation follows ACR consensus guidelines:  Management of Incidental Adrenal Masses: A White Paper of the ACR Incidental Findings Committee. J Am Coll Radiol 2017 (in press). 5. Additional incidental findings, as above. Electronically Signed   By: Vinnie Langton M.D.   On: 08/06/2015 16:57    Procedures Procedures (including critical care time)  Medications Ordered in ED Medications  morphine 4 MG/ML injection 4 mg (4 mg Intravenous Given 08/06/15 1538)  sodium chloride 0.9 % bolus 1,000 mL (0 mLs Intravenous Stopped 08/06/15 1754)  ondansetron (ZOFRAN) injection 4 mg (  4 mg Intravenous Given 08/06/15 1538)  piperacillin-tazobactam (ZOSYN) IVPB 3.375 g (0 g Intravenous Stopped 08/06/15 1755)  iopamidol (ISOVUE-300) 61 % injection 100 mL (100 mLs Intravenous Contrast Given 08/06/15 1517)  diatrizoate meglumine-sodium (GASTROGRAFIN) 66-10 % solution 15 mL (15 mLs Oral Given 08/06/15 1500)  ketorolac (TORADOL) 30 MG/ML injection 30 mg (30 mg Intravenous Given 08/06/15 2006)     Initial Impression / Assessment and Plan / ED Course  I have reviewed the triage vital signs and the nursing notes.  Pertinent labs & imaging results that were available during my care of the patient were reviewed by me and considered in my medical decision making (see chart for details).  Clinical Course   55 year old female with remote history of pelvic laparoscopy with incidental enterotomies requiring laparotomy, fibroid removal, presents with concern for severe abdominal pain and fever. Patient with signs of peritonitis on exam. CT abdomen and pelvis ordered and shows No acute findings, an incidental adrenal adenoma. Discussed recommendation for 12 month CT adrenal nodule follow-up.  Patient with biliary sludge on CT, however no other signs of cholecystitis, and given normal transaminases, more diffuse tenderness on exam, have low suspicion that presentation is consistent with cholecystitis. Given lower abdominal pain and tenderness, pelvic exam performed showing vaginal  bleeding, again diffuse tenderness.  Wet prep negative. Discussed with patient who feels STI/PID is unlikely, and given diffuse pain feel TOA is unlikely. Would consider endometriosisor other given pt menstruating and having diffuse pain although this does not correlate with fever. Pt afebrile during time in ED, no sign of infection on CT, doubt cholecystitis, doubt TOA/PID, no sign UTI.  Recommend continued ibuprofen/tylenol, close follow up with PCP and OBGYN for further evaluation.  Patient discharged in stable condition with understanding of reasons to return.   Final Clinical Impressions(s) / ED Diagnoses   Final diagnoses:  Generalized abdominal pain  Vaginal bleeding  Fever, unspecified fever cause    New Prescriptions Discharge Medication List as of 08/06/2015  8:08 PM    START taking these medications   Details  potassium chloride (K-DUR) 10 MEQ tablet Take 2 tablets (20 mEq total) by mouth daily., Starting Sat 08/06/2015, Until Tue 08/09/2015, Print         Gareth Morgan, MD 08/07/15 [id number]    Gareth Morgan, MD 11/08/15 [id number]

## 2015-08-06 NOTE — ED Notes (Signed)
No respiratory or acute distress noted alert and oriented x 3 steady gait noted.

## 2015-08-06 NOTE — Progress Notes (Signed)
[facility] [name], [place], [place] [address] [phone]  Date:  08/06/2015   Name:  Amber [name]   DOB:  [birth year]   MRN:  [id number]  PCP:  [name]., NP    Chief Complaint: Abdominal Pain (x 2 days---pt reports her "entire" abdomen but worse in lower---pt describes the pain as intermittent sharp pain---pt reports nausea--denies vomiting/diarrhea---pt reports her co worker had stomach virus---still having normal BM denies blood in stool) and Fever (102.8 yesterday--pt has taken OTC ibuprofen w/ some relief)   History of Present Illness:  Amber [name] is a 55 y.o. very pleasant female patient who presents with the following:  Here today because "my stomach is killing me," she has noted this since Thursday and today is Saturday.  She notes that her entire abdomen hurts, and it also hurts if she takes a deep breath or coughs.  She has been nauseated but no vomiting.  She is eating some but not much She had a temp of 102.8 yesterday.  She has noted low grade fevers as of Thursday, tmax yesterday She does not have diarrhea She is on plaquenil No urinary sx, she is menstruating right now but no other vaginal sx  She had a small bowel repair many years ago and last year an operation to remove fibroid tumors.   Patient Active Problem List   Diagnosis Date Noted  . Fibroids 07/27/2014  . Migraine 04/19/2014  . Nail fungus 04/09/2014  . Elevated serum protein level 04/09/2014  . Preventative health care 04/09/2014  . Iron deficiency anemia 09/02/2013  . Headache(784.0) 03/04/2013  . Obesity (BMI 30-39.9) 10/17/2012  . Glaucoma 11/07/2011  . Rheumatoid arthritis ([facility]) 11/07/2011  . Borderline diabetes 11/07/2011  . HTN (hypertension) 11/05/2011  . Sjogren's disease ([place]) 11/05/2011  . Anemia 09/22/2010  . Fibroid uterus 09/22/2010  . Neutropenia 09/22/2010  . Menorrhagia 09/15/2010  .  Dysmenorrhea 09/15/2010    Past Medical History:  Diagnosis Date  . Anemia   . Ectopic pregnancy    RIGHT salpingostomy  . Elective abortion   . GERD (gastroesophageal reflux disease)   . Glaucoma    both eyes  . Headache    migraines  . History of chicken pox   . Hypertension   . Rheumatoid arthritis(714.0) 11/07/2011  . SAB (spontaneous abortion)   . Sjogren's disease [facility])     Past Surgical History:  Procedure Laterality Date  . ABDOMINAL SURGERY     bowel repair  . COLONOSCOPY    . DILATION AND CURETTAGE OF UTERUS     X 2  . KNEE SURGERY     MENISCUS TEAR REPAIR  . MYOMECTOMY N/A 07/27/2014   Procedure: ABDOMINAL MYOMECTOMY;  Surgeon: Waymon Amato, MD;  Location: [facility];  Service: Gynecology;  Laterality: N/A;  . PELVIC LAPAROSCOPY  1994   IN 1999 LAPAROSCOPY WITH INCIDENTAL ENTEROTOMY REQUIRING LAPAROTOMY    Social History  Substance Use Topics  . Smoking status: Never Smoker  . Smokeless tobacco: Never Used  . Alcohol use No    Family History  Problem Relation Age of Onset  . Hypertension Mother     died from heart disease  . Heart disease Mother     deceased, unknown cause  . Diabetes Sister   . Hypertension Sister   . Diabetes Sister     No Known Allergies  Medication list has been reviewed and updated.  Current  Outpatient Prescriptions on File Prior to Visit  Medication Sig Dispense Refill  . ferrous sulfate 325 (65 FE) MG tablet Take 325 mg by mouth 3 (three) times daily with meals.     . furosemide (LASIX) 20 MG tablet Take 1 tablet (20 mg total) by mouth daily as needed for fluid. 30 tablet 3  . hydroxychloroquine (PLAQUENIL) 200 MG tablet Take 200 mg by mouth 2 (two) times daily.     Marland Kitchen ibuprofen (ADVIL,MOTRIN) 600 MG tablet 1  po  pc every 6 hours for 5 days then as needed for pain 30 tablet 1  . lisinopril (PRINIVIL,ZESTRIL) 20 MG tablet Take 1 tablet (20 mg total) by mouth daily. 30 tablet 5  . metoprolol succinate (TOPROL-XL) 50 MG 24 hr  tablet Take 1 tablet (50 mg total) by mouth daily. Take with or immediately following a meal. 30 tablet 5  . potassium chloride SA (K-DUR,KLOR-CON) 20 MEQ tablet Take 20 mEq by mouth at bedtime.    . SUMAtriptan (IMITREX) 50 MG tablet Take 1 tablet (50 mg total) by mouth every 2 (two) hours as needed for migraine. May repeat in 2 hours if headache persists or recurs. 10 tablet 0  . TRAVATAN Z 0.004 % SOLN ophthalmic solution Place 1 drop into both eyes At bedtime.     No current facility-administered medications on file prior to visit.     Review of Systems:  As per HPI- otherwise negative.   Physical Examination: Vitals:   08/06/15 1027  BP: (!) 144/80  Pulse: 84  Temp: 98.2 F (36.8 C)     There were no vitals filed for this visit. Vitals:   08/06/15 1027  Weight: 201 lb (91.2 kg)   Body mass index is 37.98 kg/m. Ideal Body Weight:    GEN: WDWN, NAD, Non-toxic, A & O x 3, moving slowly due to belly pain HEENT: Atraumatic, Normocephalic. Neck supple. No masses, No LAD. Ears and Nose: No external deformity. CV: RRR, No M/G/R. No JVD. No thrill. No extra heart sounds. PULM: CTA B, no wheezes, crackles, rhonchi. No retractions. No resp. distress. No accessory muscle use. ABD:  ND, +BS. No HSM.  Pt has a hard time lying down on table due to belly pain. She has lacrimating pain to palpation throughout the belly  EXTR: No c/c/e NEURO Normal gait.  PSYCH: Normally interactive. Conversant. Not depressed or anxious appearing.  Calm demeanor.    Assessment and Plan: Generalized abdominal pain   Pt came to Saturday clinic with belly pain and fever.  Her exam is quite concerning.  She needs ER evaluation. Clinic is across the street from [facility]- she will proceed to their ER immediately.  Offered to call EMS but pt declines, she agrees to go to the ER right away   Signed Lamar Blinks, MD

## 2015-08-06 NOTE — ED Notes (Signed)
Pt states just used the restroom out in the lobby and is now unable to give a urine sample.

## 2015-08-06 NOTE — ED Notes (Signed)
Pelvic cart at bedside. 

## 2015-08-06 NOTE — ED Notes (Signed)
Patient in ct 

## 2015-08-06 NOTE — Progress Notes (Signed)
Pre visit review using our clinic review tool, if applicable. No additional management support is needed unless otherwise documented below in the visit note. 

## 2015-08-06 NOTE — ED Triage Notes (Signed)
Pt complaint of fevers and generalized abdominal pain with nausea persisting since Thursday. Pt denies emesis or diarrhea. Last motrin 0430 today.

## 2015-08-06 NOTE — Progress Notes (Signed)
Pharmacy Antibiotic Note  Amber [name] is a 55 y.o. female admitted on 08/06/2015 with intra-abdominal infection.  She reports fever + abdominal pain/nausea for 48hrs.   Pharmacy has been consulted for Zosyn dosing. Renal function is at patient's baseline.    Plan: Zosyn 3.375g IV q8h (4 hour infusion).  Height: 5\' 4"  (162.6 cm) Weight: 201 lb (91.2 kg) IBW/kg (Calculated) : 54.7  Temp (24hrs), Avg:98.6 F (37 C), Min:98.2 F (36.8 C), Max:99 F (37.2 C)   Recent Labs Lab 08/06/15 1208  WBC 7.3  CREATININE 0.68    Estimated Creatinine Clearance: 87.9 mL/min (by C-G formula based on SCr of 0.8 mg/dL).    No Known Allergies  Antimicrobials this admission: Zosyn 8/5 >>   Dose adjustments this admission:  Microbiology results: 8/5 BCx: ordered  Thank you for allowing pharmacy to be a part of this patient's care.  Netta Cedars, PharmD, BCPS Pager: [phone] 08/06/2015 2:52 PM

## 2015-08-08 ENCOUNTER — Ambulatory Visit (INDEPENDENT_AMBULATORY_CARE_PROVIDER_SITE_OTHER): Payer: BLUE CROSS/BLUE SHIELD | Admitting: Family

## 2015-08-08 ENCOUNTER — Encounter: Payer: Self-pay | Admitting: Family

## 2015-08-08 VITALS — BP 144/82 | HR 75 | Temp 98.2°F | Resp 16 | Ht 61.0 in | Wt 204.4 lb

## 2015-08-08 DIAGNOSIS — R110 Nausea: Secondary | ICD-10-CM

## 2015-08-08 DIAGNOSIS — R809 Proteinuria, unspecified: Secondary | ICD-10-CM

## 2015-08-08 LAB — GC/CHLAMYDIA PROBE AMP (~~LOC~~) NOT AT ARMC
CHLAMYDIA, DNA PROBE: NEGATIVE
NEISSERIA GONORRHEA: NEGATIVE

## 2015-08-08 MED ORDER — ONDANSETRON 4 MG PO TBDP: 4.0000 mg | ORAL_TABLET | Freq: Three times a day (TID) | ORAL | 0 refills | Status: DC | PRN

## 2015-08-08 NOTE — Progress Notes (Signed)
Subjective:    Patient ID: Amber [name], female    DOB: 05/21/1960, 55 y.o.   MRN: [id number]  HPI  Amber [name] is a 55 yr old female who presents today with chief complaint of abdominal pain. Pt reports that abdominal pain began last Thursday 08/04/15.  Was seen in the ED on 08/06/15.  ED record is reviewed.  Potassium was low that day (3.1), she was mildly anemic with Hgb of 11.2.  CT abd/pelvis was performed and noted no acute findings. Incidental noted of adrenal adenoma was noted.  She was noted to have some biliary sludge on CT.  Blood culture x 2 reveal NGTD.  Wet prep was negative. UA noted protein and hemoglobin.  LFT normal. Blood count normal.   She reports ongoing nausea without vomiting. Denies fever since Saturday. Last Friday AM she had Temp 102.8.  She takes iron so stools have been dark. Denies BRBPR.  She reports that she is still having abdominal pain but is less severe.  She reports that 2 of her co-workers have had GI illnesses.     Review of Systems    see HPI  Past Medical History:  Diagnosis Date  . Anemia   . Ectopic pregnancy    RIGHT salpingostomy  . Elective abortion   . GERD (gastroesophageal reflux disease)   . Glaucoma    both eyes  . Headache    migraines  . History of chicken pox   . Hypertension   . Rheumatoid arthritis(714.0) 11/07/2011  . SAB (spontaneous abortion)   . Sjogren's disease [facility])      Social History   Social History  . Marital status: Single    Spouse name: N/A  . Number of children: 0  . Years of education: N/A   Occupational History  .  [employer] History Main Topics  . Smoking status: Never Smoker  . Smokeless tobacco: Never Used  . Alcohol use No  . Drug use: No  . Sexual activity: Not on file     Comment: 1st intercourse- 17, partners- 5   Other Topics Concern  . Not on file   Social History Narrative   Regular exercise:  No   Caffeine Use: 2 cups coffee daily   No children   "Happily  divorced"   Reports that she is involved at her church   Works with intellectually challenged adults   Born [place]- she came to [place] as adult.  Age 48.              Past Surgical History:  Procedure Laterality Date  . ABDOMINAL SURGERY     bowel repair  . COLONOSCOPY    . DILATION AND CURETTAGE OF UTERUS     X 2  . KNEE SURGERY     MENISCUS TEAR REPAIR  . MYOMECTOMY N/A 07/27/2014   Procedure: ABDOMINAL MYOMECTOMY;  Surgeon: Waymon Amato, MD;  Location: [facility];  Service: Gynecology;  Laterality: N/A;  . PELVIC LAPAROSCOPY  1994   IN 1999 LAPAROSCOPY WITH INCIDENTAL ENTEROTOMY REQUIRING LAPAROTOMY    Family History  Problem Relation Age of Onset  . Hypertension Mother     died from heart disease  . Heart disease Mother     deceased, unknown cause  . Diabetes Sister   . Hypertension Sister   . Diabetes Sister     No Known Allergies  Current Outpatient Prescriptions on File Prior to Visit  Medication Sig Dispense Refill  .  ferrous sulfate 325 (65 FE) MG tablet Take 325 mg by mouth 3 (three) times daily with meals.     . furosemide (LASIX) 20 MG tablet Take 1 tablet (20 mg total) by mouth daily as needed for fluid. 30 tablet 3  . hydroxychloroquine (PLAQUENIL) 200 MG tablet Take 200 mg by mouth 2 (two) times daily.     Marland Kitchen ibuprofen (ADVIL,MOTRIN) 600 MG tablet 1  po  pc every 6 hours for 5 days then as needed for pain (Patient taking differently: Take 600 mg by mouth every 6 (six) hours as needed for mild pain or moderate pain. 1  po  pc every 6 hours for 5 days then as needed for pain) 30 tablet 1  . lisinopril (PRINIVIL,ZESTRIL) 20 MG tablet Take 1 tablet (20 mg total) by mouth daily. 30 tablet 5  . metoprolol succinate (TOPROL-XL) 50 MG 24 hr tablet Take 1 tablet (50 mg total) by mouth daily. Take with or immediately following a meal. 30 tablet 5  . potassium chloride (K-DUR) 10 MEQ tablet Take 2 tablets (20 mEq total) by mouth daily. 6 tablet 0  . SUMAtriptan (IMITREX) 50 MG  tablet Take 1 tablet (50 mg total) by mouth every 2 (two) hours as needed for migraine. May repeat in 2 hours if headache persists or recurs. 10 tablet 0  . TRAVATAN Z 0.004 % SOLN ophthalmic solution Place 1 drop into both eyes at bedtime.      No current facility-administered medications on file prior to visit.     BP (!) 144/82   Pulse 75   Temp 98.2 F (36.8 C)   Resp 16   Ht 5\' 1"  (1.549 m)   Wt 204 lb 6.4 oz (92.7 kg)   LMP 08/05/2015   SpO2 99%   BMI 38.62 kg/m    Objective:   Physical Exam  Constitutional: She appears well-developed and well-nourished.  Cardiovascular: Normal rate, regular rhythm and normal heart sounds.   No murmur heard. Pulmonary/Chest: Effort normal and breath sounds normal. No respiratory distress. She has no wheezes.  Abdominal: Soft.  Psychiatric: She has a normal mood and affect. Her behavior is normal. Judgment and thought content normal.          Assessment & Plan:  Nausea without vomiting-Showing some clinical improvement, no further fevers, abdominal pain is less severe. Will obtain follow up bmet to assess hypokalemia. CBC to assess white count.  If symptoms do not improve, could consider H pylori and HIDA scan but her tenderness is in the lower abdomen and she has had sick contacts.  Overall symptoms are slowly improving.  I have placed a referral for her to get back in the her gastroenterologist (Dr. Collene Mares).  However, if symptoms resolve in the next few days advised pt OK to cancel this appointment. I have sent rx for zofran prn.    Proteinuria- likely related to acute illness. Will repeat UA with micro.

## 2015-08-08 NOTE — Progress Notes (Signed)
Pre visit review using our clinic review tool, if applicable. No additional management support is needed unless otherwise documented below in the visit note. 

## 2015-08-08 NOTE — Patient Instructions (Signed)
Please complete blood work prior to leaving. Go to ER if you develop worsening abdominal pain, nausea or inability to keep down food and drink. You will be contacted about your referral to GI. For nausea, you may use zofran as needed.  Make sure to stay well hydrated. Call if symptoms are not improved in 3 days.

## 2015-08-09 ENCOUNTER — Encounter: Payer: Self-pay | Admitting: Gastroenterology

## 2015-08-09 LAB — URINALYSIS, ROUTINE W REFLEX MICROSCOPIC
BILIRUBIN URINE: NEGATIVE
KETONES UR: NEGATIVE
LEUKOCYTES UA: NEGATIVE
NITRITE: NEGATIVE
SPECIFIC GRAVITY, URINE: 1.025 (ref 1.000–1.030)
Total Protein, Urine: 30 — AB
URINE GLUCOSE: NEGATIVE
UROBILINOGEN UA: 0.2 (ref 0.0–1.0)
pH: 6 (ref 5.0–8.0)

## 2015-08-09 LAB — COMPREHENSIVE METABOLIC PANEL
ALBUMIN: 3.6 g/dL (ref 3.5–5.2)
ALK PHOS: 48 U/L (ref 39–117)
ALT: 15 U/L (ref 0–35)
AST: 17 U/L (ref 0–37)
BUN: 11 mg/dL (ref 6–23)
CHLORIDE: 107 meq/L (ref 96–112)
CO2: 28 mEq/L (ref 19–32)
CREATININE: 0.67 mg/dL (ref 0.40–1.20)
Calcium: 9.6 mg/dL (ref 8.4–10.5)
GFR: 117.91 mL/min (ref >60.00)
Glucose, Bld: 87 mg/dL (ref 70–99)
POTASSIUM: 4.3 meq/L (ref 3.5–5.1)
SODIUM: 140 meq/L (ref 135–145)
TOTAL PROTEIN: 9.3 g/dL — AB (ref 6.0–8.3)
Total Bilirubin: 0.4 mg/dL (ref 0.2–1.2)

## 2015-08-09 LAB — CBC WITH DIFFERENTIAL/PLATELET
Basophils Absolute: 0 10*3/uL (ref 0.0–0.1)
Basophils Relative: 0.5 % (ref 0.0–3.0)
EOS ABS: 0.1 10*3/uL (ref 0.0–0.7)
Eosinophils Relative: 2.7 % (ref 0.0–5.0)
HEMATOCRIT: 32.8 % — AB (ref 36.0–46.0)
HEMOGLOBIN: 10.5 g/dL — AB (ref 12.0–15.0)
LYMPHS PCT: 51.5 % — AB (ref 12.0–46.0)
Lymphs Abs: 1.5 10*3/uL (ref 0.7–4.0)
MCHC: 32.2 g/dL (ref 30.0–36.0)
MCV: 81.5 fl (ref 78.0–100.0)
MONO ABS: 0.3 10*3/uL (ref 0.1–1.0)
Monocytes Relative: 10.1 % (ref 3.0–12.0)
Neutro Abs: 1 10*3/uL — ABNORMAL LOW (ref 1.4–7.7)
Neutrophils Relative %: 35.2 % — ABNORMAL LOW (ref 43.0–77.0)
Platelets: 296 10*3/uL (ref 150.0–400.0)
RBC: 4.02 Mil/uL (ref 3.87–5.11)
RDW: 14.5 % (ref 11.5–15.5)
WBC: 2.9 10*3/uL — AB (ref 4.0–10.5)

## 2015-08-10 ENCOUNTER — Telehealth: Payer: Self-pay | Admitting: Family

## 2015-08-10 ENCOUNTER — Other Ambulatory Visit (INDEPENDENT_AMBULATORY_CARE_PROVIDER_SITE_OTHER): Payer: BLUE CROSS/BLUE SHIELD

## 2015-08-10 DIAGNOSIS — D649 Anemia, unspecified: Secondary | ICD-10-CM

## 2015-08-10 LAB — FERRITIN: FERRITIN: 42.5 ng/mL (ref 10.0–291.0)

## 2015-08-10 LAB — IRON: Iron: 17 ug/dL — ABNORMAL LOW (ref 42–145)

## 2015-08-10 NOTE — Progress Notes (Unsigned)
Iron

## 2015-08-10 NOTE — Telephone Encounter (Signed)
°  Relationship to patient: Self  Can be reached: [phone]   Reason for call: Patient is requesting a note for work. States she has been out of work since Monday and would like to return tomorrow.  States that the nausea and pain is better.

## 2015-08-11 ENCOUNTER — Encounter: Payer: Self-pay | Admitting: Family

## 2015-08-11 LAB — CULTURE, BLOOD (ROUTINE X 2)
CULTURE: NO GROWTH
Culture: NO GROWTH

## 2015-08-11 NOTE — Telephone Encounter (Signed)
Called ptient to inform her that her Work note is ready for pick up and will be left at front desk.

## 2015-08-11 NOTE — Telephone Encounter (Signed)
See letter.

## 2015-08-13 ENCOUNTER — Encounter: Payer: Self-pay | Admitting: Family

## 2015-08-20 ENCOUNTER — Telehealth: Payer: Self-pay | Admitting: Oncology

## 2015-08-20 NOTE — Telephone Encounter (Signed)
Returned pt's call requesting appt with Dr. Alen Blew. Lvm advising next appt on schedule is 11/04/15 @ 10am.

## 2015-09-20 ENCOUNTER — Other Ambulatory Visit: Payer: Self-pay | Admitting: Podiatry

## 2015-09-20 ENCOUNTER — Ambulatory Visit
Admission: RE | Admit: 2015-09-20 | Discharge: 2015-09-20 | Disposition: A | Discharge status: Home / Self Care | Payer: BLUE CROSS/BLUE SHIELD | Source: Ambulatory Visit | Attending: Podiatry | Admitting: Podiatry

## 2015-09-20 DIAGNOSIS — M7731 Calcaneal spur, right foot: Secondary | ICD-10-CM

## 2015-09-20 DIAGNOSIS — M7732 Calcaneal spur, left foot: Principal | ICD-10-CM

## 2015-10-14 ENCOUNTER — Ambulatory Visit (INDEPENDENT_AMBULATORY_CARE_PROVIDER_SITE_OTHER): Payer: BLUE CROSS/BLUE SHIELD | Admitting: Gastroenterology

## 2015-10-14 ENCOUNTER — Encounter: Payer: Self-pay | Admitting: Gastroenterology

## 2015-10-14 VITALS — BP 146/76 | HR 96 | Ht 60.0 in | Wt 196.4 lb

## 2015-10-14 DIAGNOSIS — R932 Abnormal findings on diagnostic imaging of liver and biliary tract: Secondary | ICD-10-CM | POA: Diagnosis not present

## 2015-10-14 DIAGNOSIS — R110 Nausea: Secondary | ICD-10-CM

## 2015-10-14 MED ORDER — ONDANSETRON 4 MG PO TBDP: 4.0000 mg | ORAL_TABLET | Freq: Three times a day (TID) | ORAL | 0 refills | Status: DC | PRN

## 2015-10-14 NOTE — Patient Instructions (Signed)
If you are age 54 or older, your body mass index should be between 23-30. Your Body mass index is 38.35 kg/m. If this is out of the aforementioned range listed, please consider follow up with your Primary Care Provider.  If you are age 78 or younger, your body mass index should be between 19-25. Your Body mass index is 38.35 kg/m. If this is out of the aformentioned range listed, please consider follow up with your Primary Care Provider.   You have been scheduled for an endoscopy. Please follow written instructions given to you at your visit today. If you use inhalers (even only as needed), please bring them with you on the day of your procedure. Your physician has requested that you go to www.startemmi.com and enter the access code given to you at your visit today. This web site gives a general overview about your procedure. However, you should still follow specific instructions given to you by our office regarding your preparation for the procedure.  Thank you for choosing [facility]  Dr Wilfrid Lund III

## 2015-10-14 NOTE — Progress Notes (Signed)
[facility] Consult Note:  History: Amber [name] 10/14/2015  Referring physician: [name]., NP  Reason for consult/chief complaint: nausea and vomiting (in August, had ED visit and was told had abnormal CT, still having some nausea, )   Subjective  HPI:  Amber [name] sees me today as a referral from primary care due to nausea and abnormal imaging of the bile duct. She was acutely sick in early August with severe generalized abdominal pain, fever and protracted vomiting. She was evaluated in the ED, workup as noted below. It was felt that she most likely had an infectious enteritis, and was treated symptomatically. She got much better over the next several days, but since then still has persistent nausea. It seems to wax and wane in intensity, with no clear triggers. It does improve with Zofran that was prescribed by primary care. She is increasingly concerned because her sister was apparently diagnosed with either esophageal or gastric cancer earlier this summer. She denies dysphagia, early satiety, ongoing vomiting or unexpected weight loss. She is up-to-date with colon cancer screening, there is a report of a colonoscopy done by Dr. Collene Mares in December 2015, were 2 diminutive polyps of unknown pathology were reviewed. She recommended recall in 5 years, and is presumably on that practice's recall list.  ROS:  Review of Systems  Constitutional: Negative for appetite change and unexpected weight change.  HENT: Negative for mouth sores and voice change.   Eyes: Negative for pain and redness.  Respiratory: Negative for cough and shortness of breath.   Cardiovascular: Negative for chest pain and palpitations.  Genitourinary: Negative for dysuria and hematuria.  Musculoskeletal: Positive for arthralgias. Negative for myalgias.  Skin: Negative for pallor and rash.  Neurological: Positive for headaches. Negative for weakness.  Hematological: Negative for adenopathy.      Past Medical History: Past Medical History:  Diagnosis Date  . Anemia   . Colon polyps   . Ectopic pregnancy    RIGHT salpingostomy  . Elective abortion   . GERD (gastroesophageal reflux disease)   . Glaucoma    both eyes  . Headache    migraines  . History of chicken pox   . Hypertension   . Rheumatoid arthritis(714.0) 11/07/2011  . SAB (spontaneous abortion)   . Sjogren's disease [facility])      Past Surgical History: Past Surgical History:  Procedure Laterality Date  . ABDOMINAL SURGERY     bowel repair  . COLONOSCOPY    . DILATION AND CURETTAGE OF UTERUS     X 2  . MENISCUS REPAIR Right    MENISCUS TEAR REPAIR  . MYOMECTOMY N/A 07/27/2014   Procedure: ABDOMINAL MYOMECTOMY;  Surgeon: Waymon Amato, MD;  Location: [facility];  Service: Gynecology;  Laterality: N/A;  . PELVIC LAPAROSCOPY  1994   IN 1999 LAPAROSCOPY WITH INCIDENTAL ENTEROTOMY REQUIRING LAPAROTOMY     Family History: Family History  Problem Relation Age of Onset  . Hypertension Mother     died from heart disease  . Heart disease Mother     deceased, unknown cause  . Diabetes Sister   . Hypertension Sister   . Esophageal cancer Sister   . Stomach cancer Sister   . Diabetes Sister     Social History: Social History   Social History  . Marital status: Single    Spouse name: N/A  . Number of children: 0  . Years of education: N/A   Occupational History  . [employer]   Social History Main  Topics  . Smoking status: Never Smoker  . Smokeless tobacco: Never Used  . Alcohol use No  . Drug use: No  . Sexual activity: Not Asked     Comment: 1st intercourse- 17, partners- 5   Other Topics Concern  . None   Social History Narrative   Regular exercise:  No   Caffeine Use: 2 cups coffee daily   No children   "Happily divorced"   Reports that she is involved at her church   Works with intellectually challenged adults   Born [place]- she came to [place] as adult.  Age 55.              Originally from [place]  Allergies: No Known Allergies  Outpatient Meds: Current Outpatient Prescriptions  Medication Sig Dispense Refill  . ferrous sulfate 325 (65 FE) MG tablet Take 325 mg by mouth 3 (three) times daily with meals.     . furosemide (LASIX) 20 MG tablet Take 1 tablet (20 mg total) by mouth daily as needed for fluid. 30 tablet 3  . hydroxychloroquine (PLAQUENIL) 200 MG tablet Take 200 mg by mouth 2 (two) times daily.     Marland Kitchen ibuprofen (ADVIL,MOTRIN) 600 MG tablet 1  po  pc every 6 hours for 5 days then as needed for pain (Patient taking differently: Take 600 mg by mouth every 6 (six) hours as needed for mild pain or moderate pain. 1  po  pc every 6 hours for 5 days then as needed for pain) 30 tablet 1  . lisinopril (PRINIVIL,ZESTRIL) 20 MG tablet Take 1 tablet (20 mg total) by mouth daily. 30 tablet 5  . metoprolol succinate (TOPROL-XL) 50 MG 24 hr tablet Take 1 tablet (50 mg total) by mouth daily. Take with or immediately following a meal. 30 tablet 5  . ondansetron (ZOFRAN ODT) 4 MG disintegrating tablet Take 1 tablet (4 mg total) by mouth every 8 (eight) hours as needed for nausea or vomiting. 20 tablet 0  . SUMAtriptan (IMITREX) 50 MG tablet Take 1 tablet (50 mg total) by mouth every 2 (two) hours as needed for migraine. May repeat in 2 hours if headache persists or recurs. 10 tablet 0  . TRAVATAN Z 0.004 % SOLN ophthalmic solution Place 1 drop into both eyes at bedtime.     . potassium chloride (K-DUR) 10 MEQ tablet Take 2 tablets (20 mEq total) by mouth daily. 6 tablet 0   No current facility-administered medications for this visit.       ___________________________________________________________________ Objective   Exam:  BP (!) 146/76 (BP Location: Left Arm, Patient Position: Sitting, Cuff Size: Large)   Pulse 96   Ht 5' (1.524 m) Comment: height measured without shoes  Wt 196 lb 6 oz (89.1 kg)   LMP 09/10/2015 (Exact Date)   BMI 38.35 kg/m     General: this is a(n) overweight middle-aged woman. Pleasant, conversational and well-appearing  Eyes: sclera anicteric, no redness  ENT: oral mucosa moist without lesions, no cervical or supraclavicular lymphadenopathy, good dentition  CV: RRR without murmur, S1/S2, no JVD, no peripheral edema  Resp: clear to auscultation bilaterally, normal RR and effort noted  GI: soft, no tenderness, with active bowel sounds. No guarding or palpable organomegaly noted.  Skin; warm and dry, no rash or jaundice noted  Neuro: awake, alert and oriented x 3. 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  . Nausea without vomiting Yes  . Abnormal findings on diagnostic imaging of gallbladder     It seems like she had an acute viral gastroenteritis in early August, but still has persistent nausea. Perhaps she is H. pylori infection or this is a actually subsiding postinfectious syndrome. Neoplasia seems much less likely, but she is very concerned regarding her sisters history.  Plan:  I refilled her Zofran Upper endoscopy  The benefits and risks of the planned procedure were described in detail with the patient or (when appropriate) their health care proxy.  Risks were outlined as including, but not limited to, bleeding, infection, perforation, adverse medication reaction leading to cardiac or pulmonary decompensation, or pancreatitis (if ERCP).  The limitation of incomplete mucosal visualization was also discussed.  No guarantees or warranties were given.   Thank you for the  courtesy of this consult.  Please call me with any questions or concerns.  Nelida Meuse III  CC: [name]., NP

## 2015-10-18 ENCOUNTER — Telehealth: Payer: Self-pay | Admitting: *Deleted

## 2015-10-18 NOTE — Telephone Encounter (Signed)
Labs from [facility] faxed to dr Alen Blew. Copies to his desk.

## 2015-10-24 ENCOUNTER — Telehealth: Payer: Self-pay | Admitting: *Deleted

## 2015-10-24 NOTE — Telephone Encounter (Signed)
Patient calling to ask if dr Alen Blew saw her labs that were faxed here last week? States she is so tired and all she wants to do is sleep. Next appt is 11/04/15

## 2015-10-24 NOTE — Telephone Encounter (Signed)
Please let her know to keep her appointment as scheduled

## 2015-10-24 NOTE — Telephone Encounter (Signed)
Spoke with patient, encouraged her to keep her regularly scheduled appt with dr Alen Blew 11/04/15.

## 2015-10-31 ENCOUNTER — Encounter: Payer: Self-pay | Admitting: Gastroenterology

## 2015-11-04 ENCOUNTER — Ambulatory Visit (HOSPITAL_BASED_OUTPATIENT_CLINIC_OR_DEPARTMENT_OTHER): Payer: BLUE CROSS/BLUE SHIELD | Admitting: Oncology

## 2015-11-04 ENCOUNTER — Other Ambulatory Visit (HOSPITAL_BASED_OUTPATIENT_CLINIC_OR_DEPARTMENT_OTHER): Payer: BLUE CROSS/BLUE SHIELD

## 2015-11-04 VITALS — BP 149/85 | HR 73 | Temp 98.5°F | Resp 18 | Ht 60.0 in | Wt 201.9 lb

## 2015-11-04 DIAGNOSIS — D500 Iron deficiency anemia secondary to blood loss (chronic): Secondary | ICD-10-CM

## 2015-11-04 DIAGNOSIS — N921 Excessive and frequent menstruation with irregular cycle: Secondary | ICD-10-CM

## 2015-11-04 DIAGNOSIS — D72819 Decreased white blood cell count, unspecified: Secondary | ICD-10-CM

## 2015-11-04 LAB — CBC WITH DIFFERENTIAL/PLATELET
BASO%: 0.9 % (ref 0.0–2.0)
BASOS ABS: 0 10*3/uL (ref 0.0–0.1)
EOS%: 3.3 % (ref 0.0–7.0)
Eosinophils Absolute: 0.1 10*3/uL (ref 0.0–0.5)
HEMATOCRIT: 34.5 % — AB (ref 34.8–46.6)
HEMOGLOBIN: 10.7 g/dL — AB (ref 11.6–15.9)
LYMPH#: 1.3 10*3/uL (ref 0.9–3.3)
LYMPH%: 55.8 % — ABNORMAL HIGH (ref 14.0–49.7)
MCH: 24.4 pg — ABNORMAL LOW (ref 25.1–34.0)
MCHC: 31.1 g/dL — ABNORMAL LOW (ref 31.5–36.0)
MCV: 78.5 fL — ABNORMAL LOW (ref 79.5–101.0)
MONO#: 0.2 10*3/uL (ref 0.1–0.9)
MONO%: 9.5 % (ref 0.0–14.0)
NEUT%: 30.5 % — ABNORMAL LOW (ref 38.4–76.8)
NEUTROS ABS: 0.7 10*3/uL — AB (ref 1.5–6.5)
Platelets: 224 10*3/uL (ref 145–400)
RBC: 4.4 10*6/uL (ref 3.70–5.45)
RDW: 16.3 % — AB (ref 11.2–14.5)
WBC: 2.4 10*3/uL — AB (ref 3.9–10.3)

## 2015-11-04 LAB — IRON AND TIBC
%SAT: 13 % — AB (ref 21–57)
IRON: 49 ug/dL (ref 41–142)
TIBC: 365 ug/dL (ref 236–444)
UIBC: 316 ug/dL (ref 120–384)

## 2015-11-04 LAB — FERRITIN: FERRITIN: 12 ng/mL (ref 9–269)

## 2015-11-04 NOTE — Progress Notes (Signed)
Hematology and Oncology Follow Up Visit  Amber [name] [id number] May 29, 1960 55 y.o. 11/04/2015 10:46 AM   Principle Diagnosis: 55 year old woman with iron deficiency anemia due to menorrhagia and uterine fibroids. 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  Prior Therapy:  She is status post IV iron infusion in the form of Feraheme given on 05/05/2012, 10/2012 and 09/2013.  The most recent of which done in June 2016.  Current therapy: Oral iron supplements on a daily basis (325 mg by mouth once a day). She has been noncompliant with oral iron.  Interim History: Amber [name] presents today for a followup visit. Since the last visit, she reports decline in her energy and more fatigued. She continues to have periodic menstrual cycles despite her uterine fibroids been treated in the past. She denied any hematochezia or melena or blood she is scheduled to have a endoscopy in the near future. She did have a colonoscopy in 2013.   She reports she has not been able to take oral iron consistently because of constipation, dyspepsia and abdominal pain. She reports more decline in her performance status and exercise tolerance. Despite that, she continues to work full time and have not reported any recent hospitalization or illnesses.   She does not report any headaches, blurry vision, syncope or seizures. She has not reported any chest pain or shortness of breath. She does not report any constitutional symptoms of fevers chills or sweats. She does not report any frequency urgency or hesitancy. She does not report any lymphadenopathy or petechiae. Remainder or view of system is unremarkable.  Medications: I have reviewed the patient's current medications.  Current Outpatient Prescriptions  Medication Sig Dispense Refill  . Cyanocobalamin (VITAMIN B-12) 5000 MCG LOZG Take 1 tablet by mouth 2 (two) times daily.    . ferrous sulfate 325 (65 FE) MG tablet Take 325 mg  by mouth 3 (three) times daily with meals.     . furosemide (LASIX) 20 MG tablet Take 1 tablet (20 mg total) by mouth daily as needed for fluid. 30 tablet 3  . hydroxychloroquine (PLAQUENIL) 200 MG tablet Take 200 mg by mouth 2 (two) times daily.     Marland Kitchen ibuprofen (ADVIL,MOTRIN) 600 MG tablet 1  po  pc every 6 hours for 5 days then as needed for pain (Patient taking differently: Take 600 mg by mouth every 6 (six) hours as needed for mild pain or moderate pain. 1  po  pc every 6 hours for 5 days then as needed for pain) 30 tablet 1  . lisinopril (PRINIVIL,ZESTRIL) 20 MG tablet Take 1 tablet (20 mg total) by mouth daily. 30 tablet 5  . metoprolol succinate (TOPROL-XL) 50 MG 24 hr tablet Take 1 tablet (50 mg total) by mouth daily. Take with or immediately following a meal. 30 tablet 5  . ondansetron (ZOFRAN ODT) 4 MG disintegrating tablet Take 1 tablet (4 mg total) by mouth every 8 (eight) hours as needed for nausea or vomiting. 20 tablet 0  . SUMAtriptan (IMITREX) 50 MG tablet Take 1 tablet (50 mg total) by mouth every 2 (two) hours as needed for migraine. May repeat in 2 hours if headache persists or recurs. 10 tablet 0  . TRAVATAN Z 0.004 % SOLN ophthalmic solution Place 1 drop into both eyes at bedtime.     . potassium chloride (K-DUR) 10 MEQ tablet Take 2 tablets (20 mEq total) by mouth daily. 6 tablet 0   No current  facility-administered medications for this visit.      Allergies: No Known Allergies    Physical Exam: Blood pressure (!) 149/85, pulse 73, temperature 98.5 F (36.9 C), temperature source Oral, resp. rate 18, height 5' (1.524 m), weight 201 lb 14.4 oz (91.6 kg), SpO2 100 %. ECOG: 0 General appearance: Well-appearing woman without distress. Head: Normocephalic. No oral ulcers or thrush. Neck: no adenopathy or masses. No thyromegaly. Lymph nodes: Cervical, supraclavicular, and axillary nodes normal. Heart:regular rate and rhythm, S1, S2 normal, no murmur, click, rub or  gallop Lung:chest clear, no wheezing, rales, normal symmetric air entry no dullness to percussion. Abdomen: soft without masses or organomegaly no dullness or ascites. EXT:no erythema, or edema.   Lab Results: Lab Results  Component Value Date   WBC 2.4 (L) 11/04/2015   HGB 10.7 (L) 11/04/2015   HCT 34.5 (L) 11/04/2015   MCV 78.5 (L) 11/04/2015   PLT 224 11/04/2015     Chemistry      Component Value Date/Time   NA 140 08/08/2015 1654   NA 138 01/21/2015 0935   K 4.3 08/08/2015 1654   K 3.9 01/21/2015 0935   CL 107 08/08/2015 1654   CL 108 (H) 06/25/2012 1508   CO2 28 08/08/2015 1654   CO2 25 01/21/2015 0935   BUN 11 08/08/2015 1654   BUN 7.0 01/21/2015 0935   CREATININE 0.67 08/08/2015 1654   CREATININE 0.8 01/21/2015 0935      Component Value Date/Time   CALCIUM 9.6 08/08/2015 1654   CALCIUM 9.4 01/21/2015 0935   ALKPHOS 48 08/08/2015 1654   ALKPHOS 54 01/21/2015 0935   AST 17 08/08/2015 1654   AST 17 01/21/2015 0935   ALT 15 08/08/2015 1654   ALT 15 01/21/2015 0935   BILITOT 0.4 08/08/2015 1654   BILITOT 0.55 01/21/2015 0935       Impression and Plan:  55 year old woman with the following issues:  1. Iron deficiency anemia due to menorrhagia: She is status post IV iron on June 2016 and currently on oral iron.   Hemoglobin is dropping now with poor tolerance to oral iron. Risks and benefits of repeat Feraheme was discussed and she is agreeable to proceed with that. Complications include arthralgias, myalgias and rarely anaphylaxis were reviewed and she is agreeable.  2. Leukocytopenia: Bone marrow biopsy done on 09/09/2013 did not show any evidence of dysplasia or infiltrative bone marrow process. There is no evidence of any lymphoproliferative disease. Absolute neutrophil count is around 900 without any recurrent infections.   Her neutropenia is likely related to autoimmune disease and rheumatoid arthritis and does not appear to have changed.  3. Rheumatoid  arthritis: She currently on Plaquenil and seems to be adequately controlling her disease.  4. Followup will be in 3 months to recheck your iron stores.   Amber [name] 11/3/201710:46 AM

## 2015-11-11 ENCOUNTER — Ambulatory Visit (HOSPITAL_BASED_OUTPATIENT_CLINIC_OR_DEPARTMENT_OTHER): Payer: BLUE CROSS/BLUE SHIELD

## 2015-11-11 ENCOUNTER — Encounter: Payer: Self-pay | Admitting: Gastroenterology

## 2015-11-11 ENCOUNTER — Ambulatory Visit (AMBULATORY_SURGERY_CENTER): Payer: BLUE CROSS/BLUE SHIELD | Admitting: Gastroenterology

## 2015-11-11 VITALS — BP 136/78 | HR 90 | Temp 97.8°F | Resp 15 | Ht 60.0 in | Wt 196.0 lb

## 2015-11-11 VITALS — BP 135/72 | HR 78 | Temp 97.8°F | Resp 18

## 2015-11-11 DIAGNOSIS — D500 Iron deficiency anemia secondary to blood loss (chronic): Secondary | ICD-10-CM

## 2015-11-11 DIAGNOSIS — R110 Nausea: Secondary | ICD-10-CM

## 2015-11-11 DIAGNOSIS — D649 Anemia, unspecified: Secondary | ICD-10-CM

## 2015-11-11 MED ORDER — SODIUM CHLORIDE 0.9 % IV SOLN: 500.0000 mL | INTRAVENOUS | Status: DC

## 2015-11-11 MED ORDER — SODIUM CHLORIDE 0.9 % IV SOLN
Freq: Once | INTRAVENOUS | Status: AC
  Administered 2015-11-11: 13:00:00 via INTRAVENOUS

## 2015-11-11 MED ORDER — FERUMOXYTOL INJECTION 510 MG/17 ML
510.0000 mg | Freq: Once | INTRAVENOUS | Status: AC
  Administered 2015-11-11: 510 mg via INTRAVENOUS
  Filled 2015-11-11: qty 17

## 2015-11-11 NOTE — Patient Instructions (Signed)

## 2015-11-11 NOTE — Patient Instructions (Signed)
YOU HAD AN ENDOSCOPIC PROCEDURE TODAY AT THE  ENDOSCOPY CENTER:   Refer to the procedure report that was given to you for any specific questions about what was found during the examination.  If the procedure report does not answer your questions, please call your gastroenterologist to clarify.  If you requested that your care partner not be given the details of your procedure findings, then the procedure report has been included in a sealed envelope for you to review at your convenience later.  YOU SHOULD EXPECT: Some feelings of bloating in the abdomen. Passage of more gas than usual.  Walking can help get rid of the air that was put into your GI tract during the procedure and reduce the bloating. If you had a lower endoscopy (such as a colonoscopy or flexible sigmoidoscopy) you may notice spotting of blood in your stool or on the toilet paper. If you underwent a bowel prep for your procedure, you may not have a normal bowel movement for a few days.  Please Note:  You might notice some irritation and congestion in your nose or some drainage.  This is from the oxygen used during your procedure.  There is no need for concern and it should clear up in a day or so.  SYMPTOMS TO REPORT IMMEDIATELY:    Following upper endoscopy (EGD)  Vomiting of blood or coffee ground material  New chest pain or pain under the shoulder blades  Painful or persistently difficult swallowing  New shortness of breath  Fever of 100F or higher  Black, tarry-looking stools  For urgent or emergent issues, a gastroenterologist can be reached at any hour by calling [phone].    DIET:  We do recommend a small meal at first, but then you may proceed to your regular diet.  Drink plenty of fluids but you should avoid alcoholic beverages for 24 hours.  ACTIVITY:  You should plan to take it easy for the rest of today and you should NOT DRIVE or use heavy machinery until tomorrow (because of the sedation medicines  used during the test).    FOLLOW UP: Our staff will call the number listed on your records the next business day following your procedure to check on you and address any questions or concerns that you may have regarding the information given to you following your procedure. If we do not reach you, we will leave a message.  However, if you are feeling well and you are not experiencing any problems, there is no need to return our call.  We will assume that you have returned to your regular daily activities without incident.  If any biopsies were taken you will be contacted by phone or by letter within the next 1-3 weeks.  Please call us at [phone] if you have not heard about the biopsies in 3 weeks.    SIGNATURES/CONFIDENTIALITY: You and/or your care partner have signed paperwork which will be entered into your electronic medical record.  These signatures attest to the fact that that the information above on your After Visit Summary has been reviewed and is understood.  Full responsibility of the confidentiality of this discharge information lies with you and/or your care-partner.  Thank you for letting us take care of your healthcare needs today. 

## 2015-11-11 NOTE — Op Note (Signed)
[facility] Patient Name: Amber [name] Procedure Date: 11/11/2015 11:06 AM MRN: [id number] Endoscopist: Mallie Mussel L. Loletha Carrow , MD Age: 55 Referring MD:  Date of Birth: 1960/06/05 Gender: Female Account #: [id number] Procedure:                Upper GI endoscopy Indications:              Nausea Medicines:                Monitored Anesthesia Care Procedure:                Pre-Anesthesia Assessment:                           - Prior to the procedure, a History and Physical                            was performed, and patient medications and                            allergies were reviewed. The patient's tolerance of                            previous anesthesia was also reviewed. The risks                            and benefits of the procedure and the sedation                            options and risks were discussed with the patient.                            All questions were answered, and informed consent                            was obtained. Prior Anticoagulants: The patient has                            taken no previous anticoagulant or antiplatelet                            agents. ASA Grade Assessment: II - A patient with                            mild systemic disease. After reviewing the risks                            and benefits, the patient was deemed in                            satisfactory condition to undergo the procedure.                           After obtaining informed consent, the endoscope was  passed under direct vision. Throughout the                            procedure, the patient's blood pressure, pulse, and                            oxygen saturations were monitored continuously. The                            Model GIF-[id number]) scope was introduced                            through the mouth, and advanced to the second part                            of duodenum. The upper GI endoscopy was                             accomplished without difficulty. The patient                            tolerated the procedure well. Scope In: Scope Out: Findings:                 The esophagus was normal.                           The stomach was normal.                           The cardia and gastric fundus were normal on                            retroflexion.                           The examined duodenum was normal. Complications:            No immediate complications. Estimated Blood Loss:     Estimated blood loss: none. Impression:               - Normal esophagus.                           - Normal stomach.                           - Normal examined duodenum.                           - No specimens collected.                           The patient appears to have residual                            post-infectious nausea from an acute illness 3  months ago. Recommendation:           - Patient has a contact number available for                            emergencies. The signs and symptoms of potential                            delayed complications were discussed with the                            patient. Return to normal activities tomorrow.                            Written discharge instructions were provided to the                            patient.                           - Resume previous diet.                           - Continue present medications.                           Follow up with Dr. Loletha Carrow as needed. Henry L. Loletha Carrow, MD 11/11/2015 11:17:07 AM This report has been signed electronically.

## 2015-11-11 NOTE — Progress Notes (Signed)
To recovery, report to Monday, RN, VSS 

## 2015-11-14 ENCOUNTER — Telehealth: Payer: Self-pay | Admitting: *Deleted

## 2015-11-14 NOTE — Telephone Encounter (Signed)
Left message on f/u call 

## 2015-11-14 NOTE — Telephone Encounter (Signed)
  Follow up Call-  Call back number 11/11/2015  Post procedure Call Back phone  # [phone]  Permission to leave phone message Yes  Some recent data might be hidden    [facility]

## 2015-11-18 ENCOUNTER — Ambulatory Visit (HOSPITAL_BASED_OUTPATIENT_CLINIC_OR_DEPARTMENT_OTHER): Payer: BLUE CROSS/BLUE SHIELD

## 2015-11-18 VITALS — BP 126/77 | HR 76 | Temp 98.8°F | Resp 18

## 2015-11-18 DIAGNOSIS — D508 Other iron deficiency anemias: Secondary | ICD-10-CM

## 2015-11-18 DIAGNOSIS — D500 Iron deficiency anemia secondary to blood loss (chronic): Secondary | ICD-10-CM

## 2015-11-18 MED ORDER — SODIUM CHLORIDE 0.9 % IV SOLN
510.0000 mg | Freq: Once | INTRAVENOUS | Status: AC
  Administered 2015-11-18: 510 mg via INTRAVENOUS
  Filled 2015-11-18: qty 17

## 2015-11-18 MED ORDER — SODIUM CHLORIDE 0.9 % IV SOLN
Freq: Once | INTRAVENOUS | Status: AC
  Administered 2015-11-18: 11:00:00 via INTRAVENOUS

## 2015-11-18 NOTE — Patient Instructions (Signed)

## 2015-12-09 ENCOUNTER — Ambulatory Visit (INDEPENDENT_AMBULATORY_CARE_PROVIDER_SITE_OTHER): Payer: BLUE CROSS/BLUE SHIELD | Admitting: Family

## 2015-12-09 ENCOUNTER — Encounter: Payer: Self-pay | Admitting: Family

## 2015-12-09 VITALS — BP 137/86 | HR 82 | Temp 99.5°F | Resp 16 | Ht 61.0 in | Wt 201.6 lb

## 2015-12-09 DIAGNOSIS — M5410 Radiculopathy, site unspecified: Secondary | ICD-10-CM | POA: Diagnosis not present

## 2015-12-09 MED ORDER — POTASSIUM CHLORIDE ER 10 MEQ PO TBCR: 20.0000 meq | EXTENDED_RELEASE_TABLET | Freq: Every day | ORAL | 1 refills | Status: DC

## 2015-12-09 MED ORDER — SUMATRIPTAN SUCCINATE 50 MG PO TABS: 50.0000 mg | ORAL_TABLET | ORAL | 0 refills | Status: DC | PRN

## 2015-12-09 MED ORDER — LISINOPRIL 20 MG PO TABS: 20.0000 mg | ORAL_TABLET | Freq: Every day | ORAL | 1 refills | Status: DC

## 2015-12-09 MED ORDER — MELOXICAM 7.5 MG PO TABS: 7.5000 mg | ORAL_TABLET | Freq: Every day | ORAL | 0 refills | Status: DC

## 2015-12-09 MED ORDER — METOPROLOL SUCCINATE ER 50 MG PO TB24: 50.0000 mg | ORAL_TABLET | Freq: Every day | ORAL | 1 refills | Status: DC

## 2015-12-09 MED ORDER — CYCLOBENZAPRINE HCL 5 MG PO TABS: 5.0000 mg | ORAL_TABLET | Freq: Three times a day (TID) | ORAL | 0 refills | Status: DC | PRN

## 2015-12-09 MED ORDER — FUROSEMIDE 20 MG PO TABS: 20.0000 mg | ORAL_TABLET | Freq: Every day | ORAL | 1 refills | Status: DC | PRN

## 2015-12-09 NOTE — Patient Instructions (Addendum)
Your mammogram has been scheduled for 12/29/15 at 8am. [facility]). Please begin meloxicam once daily low back pain.  You may use the flexeril at bedtime as needed. Call if new/worsening symptoms or if not improved in 2 weeks.

## 2015-12-09 NOTE — Progress Notes (Signed)
Subjective:    Patient ID: Amber [name], female    DOB: 07/07/1960, 55 y.o.   MRN: [id number]  HPI  Amber [name] is a 55 yr old female who presents today with chief complaint of left sided low back pain which radiates into the left hip pain.  Reports pain is severe.  Pain is worsened by certain movements. She has previous hx of hip pain but has not bothered her until last Tuesday.  She has been dealing with a lot of stress lately as she is caring for her sister who has stage 4 esophageal/colon cancer. She thinks that she may have injured her back lifting her sister.   Review of Systems See HPI  Past Medical History:  Diagnosis Date  . Anemia   . Arthritis   . Blood transfusion without reported diagnosis   . Colon polyps   . Ectopic pregnancy    RIGHT salpingostomy  . Elective abortion   . GERD (gastroesophageal reflux disease)   . Glaucoma    both eyes  . Headache    migraines  . History of chicken pox   . Hypertension   . Rheumatoid arthritis(714.0) 11/07/2011  . SAB (spontaneous abortion)   . Sjogren's disease [facility])      Social History   Social History  . Marital status: Single    Spouse name: N/A  . Number of children: 0  . 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 Smoking status: Never Smoker  . Smokeless tobacco: Never Used  . Alcohol use No  . Drug use: No  . Sexual activity: Yes    Birth control/ protection: None     Comment: 1st intercourse- 89, partners- 5   Other Topics Concern  . Not on file   Social History Narrative   Regular exercise:  No   Caffeine Use: 2 cups coffee daily   No children   "Happily divorced"   Reports that she is involved at her church   Works with intellectually challenged adults   Born in [place]- she came to [place] as adult.  Age 28.              Past Surgical History:  Procedure Laterality Date  . ABDOMINAL SURGERY     bowel repair  . COLONOSCOPY    .  DILATION AND CURETTAGE OF UTERUS     X 2  . MENISCUS REPAIR Right    MENISCUS TEAR REPAIR  . MYOMECTOMY N/A 07/27/2014   Procedure: ABDOMINAL MYOMECTOMY;  Surgeon: Waymon Amato, MD;  Location: [facility];  Service: Gynecology;  Laterality: N/A;  . PELVIC LAPAROSCOPY  1994   IN 1999 LAPAROSCOPY WITH INCIDENTAL ENTEROTOMY REQUIRING LAPAROTOMY  . UTERINE FIBROID SURGERY     July 2016    Family History  Problem Relation Age of Onset  . Hypertension Mother     died from heart disease  . Heart disease Mother     deceased, unknown cause  . Diabetes Sister   . Hypertension Sister   . Esophageal cancer Sister   . Stomach cancer Sister   . Diabetes Sister   . Rectal cancer Neg Hx   . Colon cancer Neg Hx     No Known Allergies  Current Outpatient Prescriptions on File Prior to Visit  Medication Sig Dispense Refill  . Cyanocobalamin (VITAMIN B-12) 5000 MCG LOZG Take 1 tablet by mouth 2 (two) times daily.    Marland Kitchen  ferrous sulfate 325 (65 FE) MG tablet Take 325 mg by mouth 3 (three) times daily with meals.     . furosemide (LASIX) 20 MG tablet Take 1 tablet (20 mg total) by mouth daily as needed for fluid. 30 tablet 3  . hydroxychloroquine (PLAQUENIL) 200 MG tablet Take 200 mg by mouth 2 (two) times daily.     Marland Kitchen ibuprofen (ADVIL,MOTRIN) 600 MG tablet 1  po  pc every 6 hours for 5 days then as needed for pain (Patient taking differently: Take 600 mg by mouth every 6 (six) hours as needed for mild pain or moderate pain. 1  po  pc every 6 hours for 5 days then as needed for pain) 30 tablet 1  . lisinopril (PRINIVIL,ZESTRIL) 20 MG tablet Take 1 tablet (20 mg total) by mouth daily. 30 tablet 5  . metoprolol succinate (TOPROL-XL) 50 MG 24 hr tablet Take 1 tablet (50 mg total) by mouth daily. Take with or immediately following a meal. 30 tablet 5  . ondansetron (ZOFRAN ODT) 4 MG disintegrating tablet Take 1 tablet (4 mg total) by mouth every 8 (eight) hours as needed for nausea or vomiting. 20 tablet 0  .  potassium chloride (K-DUR) 10 MEQ tablet Take 2 tablets (20 mEq total) by mouth daily. 6 tablet 0  . SUMAtriptan (IMITREX) 50 MG tablet Take 1 tablet (50 mg total) by mouth every 2 (two) hours as needed for migraine. May repeat in 2 hours if headache persists or recurs. 10 tablet 0  . TRAVATAN Z 0.004 % SOLN ophthalmic solution Place 1 drop into both eyes at bedtime.      Current Facility-Administered Medications on File Prior to Visit  Medication Dose Route Frequency Provider Last Rate Last Dose  . 0.9 %  sodium chloride infusion  500 mL Intravenous Continuous Nelida Meuse III, MD        BP 137/86 (BP Location: Right Arm, Cuff Size: Large)   Pulse 82   Temp 99.5 F (37.5 C) (Oral)   Resp 16   Ht 5\' 1"  (1.549 m)   Wt 201 lb 9.6 oz (91.4 kg)   LMP 11/06/2015   SpO2 100% Comment: room air  BMI 38.09 kg/m       Objective:   Physical Exam  Constitutional: She is oriented to person, place, and time. She appears well-developed and well-nourished.  HENT:  Head: Normocephalic and atraumatic.  Cardiovascular: Normal rate, regular rhythm and normal heart sounds.   No murmur heard. Pulmonary/Chest: Effort normal and breath sounds normal. No respiratory distress. She has no wheezes.  Musculoskeletal: She exhibits no edema.       Cervical back: She exhibits no tenderness.       Thoracic back: She exhibits no tenderness.       Lumbar back: She exhibits tenderness.  Neurological: She is alert and oriented to person, place, and time.  + straight leg raise on left  Psychiatric: Her behavior is normal. Judgment and thought content normal.  Tearful upon discussion of her ill sister          Assessment & Plan:  Low back pain with radiculopathy- rx with meloxicam and prn flexeril. Advised pt to call if symptoms worsen or if symptoms are not improved in 2 weeks.  Pt verbalizes understanding.

## 2015-12-09 NOTE — Progress Notes (Signed)
Pre visit review using our clinic review tool, if applicable. No additional management support is needed unless otherwise documented below in the visit note. 

## 2015-12-21 ENCOUNTER — Encounter: Payer: Self-pay | Admitting: Medical

## 2015-12-21 ENCOUNTER — Ambulatory Visit (INDEPENDENT_AMBULATORY_CARE_PROVIDER_SITE_OTHER): Payer: BLUE CROSS/BLUE SHIELD | Admitting: Medical

## 2015-12-21 ENCOUNTER — Ambulatory Visit (HOSPITAL_BASED_OUTPATIENT_CLINIC_OR_DEPARTMENT_OTHER)
Admission: RE | Admit: 2015-12-21 | Discharge: 2015-12-21 | Disposition: A | Discharge status: Home / Self Care | Payer: BLUE CROSS/BLUE SHIELD | Source: Ambulatory Visit | Attending: Medical | Admitting: Medical

## 2015-12-21 VITALS — BP 138/84 | HR 82 | Temp 98.6°F | Resp 16 | Ht 61.0 in | Wt 201.2 lb

## 2015-12-21 DIAGNOSIS — M5442 Lumbago with sciatica, left side: Secondary | ICD-10-CM | POA: Diagnosis not present

## 2015-12-21 MED ORDER — KETOROLAC TROMETHAMINE 60 MG/2ML IM SOLN
60.0000 mg | Freq: Once | INTRAMUSCULAR | Status: AC
  Administered 2015-12-21: 60 mg via INTRAMUSCULAR

## 2015-12-21 MED ORDER — TIZANIDINE HCL 4 MG PO CAPS: ORAL_CAPSULE | ORAL | 0 refills | Status: DC

## 2015-12-21 MED ORDER — MELOXICAM 7.5 MG PO TABS: ORAL_TABLET | ORAL | 1 refills | Status: DC

## 2015-12-21 NOTE — Patient Instructions (Addendum)
For you back pain today high level will give toradol 60 mg IM.   Will get xray of lumbar spine.  Hold meloxicam today. Can restart tomorrow  and can use up to 15 mg a day.  DC flexeril since sedates too much. We could try zanaflex and see if less sedating  Follow up in 10-14 days or as needed.  Red flags sign and symptoms discussed that would indicate need for ED evaluation.

## 2015-12-21 NOTE — Progress Notes (Signed)
Subjective:    Patient ID: Amber [name], female    DOB: 1960-12-09, 55 y.o.   MRN: [id number]  HPI  Pt in for back pain.   Pt seen in December 09, 2015 for back pain. She is using meloxicam 7.5 mg a day. She states flexeril 5 mg taken at night makes her drowsy all day.  Pt pain level presently 9/10. She did not take medicine this am.   Pt pain is mid lumbar/si area and radiates down her left leg. No leg weakness, no saddle anesthesia. No foot drop. No incontinence.  Pt was taking care of her sister with total care(having to transfer her at times but recent break when her sister was hospitalized). Pt sister has esophageal cancer.      Review of Systems  Constitutional: Negative for chills, fatigue and fever.  Respiratory: Negative for choking, shortness of breath and wheezing.   Cardiovascular: Negative for chest pain and palpitations.  Gastrointestinal: Negative for abdominal pain.  Musculoskeletal: Positive for back pain.  Skin: Negative for rash.  Neurological: Negative for weakness.       Some radicular pain to left lower ext.  Hematological: Negative for adenopathy. Does not bruise/bleed easily.  Psychiatric/Behavioral: Negative for behavioral problems, confusion and suicidal ideas.     Past Medical History:  Diagnosis Date  . Anemia   . Arthritis   . Blood transfusion without reported diagnosis   . Colon polyps   . Ectopic pregnancy    RIGHT salpingostomy  . Elective abortion   . GERD (gastroesophageal reflux disease)   . Glaucoma    both eyes  . Headache    migraines  . History of chicken pox   . Hypertension   . Rheumatoid arthritis(714.0) 11/07/2011  . SAB (spontaneous abortion)   . Sjogren's disease [facility])      Social History   Social History  . Marital status: Single    Spouse name: N/A  . Number of children: 0  . 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 Smoking status: Never  Smoker  . Smokeless tobacco: Never Used  . Alcohol use No  . Drug use: No  . Sexual activity: Yes    Birth control/ protection: None     Comment: 1st intercourse- 5, partners- 5   Other Topics Concern  . Not on file   Social History Narrative   Regular exercise:  No   Caffeine Use: 2 cups coffee daily   No children   "Happily divorced"   Reports that she is involved at her church   Works with intellectually challenged adults   Born in [place]- she came to [place] as adult.  Age 77.              Past Surgical History:  Procedure Laterality Date  . ABDOMINAL SURGERY     bowel repair  . COLONOSCOPY    . DILATION AND CURETTAGE OF UTERUS     X 2  . MENISCUS REPAIR Right    MENISCUS TEAR REPAIR  . MYOMECTOMY N/A 07/27/2014   Procedure: ABDOMINAL MYOMECTOMY;  Surgeon: Waymon Amato, MD;  Location: [facility];  Service: Gynecology;  Laterality: N/A;  . PELVIC LAPAROSCOPY  1994   IN 1999 LAPAROSCOPY WITH INCIDENTAL ENTEROTOMY REQUIRING LAPAROTOMY  . UTERINE FIBROID SURGERY     July 2016    Family History  Problem Relation Age of Onset  . Hypertension Mother  died from heart disease  . Heart disease Mother     deceased, unknown cause  . Diabetes Sister   . Hypertension Sister   . Esophageal cancer Sister   . Stomach cancer Sister   . Diabetes Sister   . Rectal cancer Neg Hx   . Colon cancer Neg Hx     No Known Allergies  Current Outpatient Prescriptions on File Prior to Visit  Medication Sig Dispense Refill  . ferrous sulfate 325 (65 FE) MG tablet Take 325 mg by mouth 3 (three) times daily with meals.     . furosemide (LASIX) 20 MG tablet Take 1 tablet (20 mg total) by mouth daily as needed for fluid. 90 tablet 1  . hydroxychloroquine (PLAQUENIL) 200 MG tablet Take 200 mg by mouth 2 (two) times daily.     Marland Kitchen ibuprofen (ADVIL,MOTRIN) 600 MG tablet 1  po  pc every 6 hours for 5 days then as needed for pain (Patient taking differently: Take 600 mg by mouth every 6 (six) hours as  needed for mild pain or moderate pain. 1  po  pc every 6 hours for 5 days then as needed for pain) 30 tablet 1  . lisinopril (PRINIVIL,ZESTRIL) 20 MG tablet Take 1 tablet (20 mg total) by mouth daily. 90 tablet 1  . meloxicam (MOBIC) 7.5 MG tablet Take 1 tablet (7.5 mg total) by mouth daily. (Patient taking differently: Take 7.5 mg by mouth 2 (two) times daily. ) 14 tablet 0  . metoprolol succinate (TOPROL-XL) 50 MG 24 hr tablet Take 1 tablet (50 mg total) by mouth daily. Take with or immediately following a meal. 90 tablet 1  . ondansetron (ZOFRAN ODT) 4 MG disintegrating tablet Take 1 tablet (4 mg total) by mouth every 8 (eight) hours as needed for nausea or vomiting. 20 tablet 0  . potassium chloride (K-DUR) 10 MEQ tablet Take 2 tablets (20 mEq total) by mouth daily. On the days you take lasix 90 tablet 1  . SUMAtriptan (IMITREX) 50 MG tablet Take 1 tablet (50 mg total) by mouth every 2 (two) hours as needed for migraine. May repeat in 2 hours if headache persists or recurs. 10 tablet 0  . TRAVATAN Z 0.004 % SOLN ophthalmic solution Place 1 drop into both eyes at bedtime.     . Cyanocobalamin (VITAMIN B-12) 5000 MCG LOZG Take 1 tablet by mouth 2 (two) times daily.     Current Facility-Administered Medications on File Prior to Visit  Medication Dose Route Frequency Provider Last Rate Last Dose  . 0.9 %  sodium chloride infusion  500 mL Intravenous Continuous Nelida Meuse III, MD        BP 138/84 (BP Location: Right Arm, Patient Position: Sitting, Cuff Size: Large)   Pulse 82   Temp 98.6 F (37 C) (Oral)   Resp 16   Ht 5\' 1"  (1.549 m)   Wt 201 lb 4 oz (91.3 kg)   LMP 12/12/2015   SpO2 98%   BMI 38.03 kg/m       Objective:   Physical Exam  General Appearance- Not in acute distress.    Chest and Lung Exam Auscultation: Breath sounds:-Normal. Clear even and unlabored. Adventitious sounds:- No Adventitious sounds.  Cardiovascular Auscultation:Rythm - Regular, rate and rythm.  Heart Sounds -Normal heart sounds.  Abdomen Inspection:-Inspection Normal.  Palpation/Perucssion: Palpation and Percussion of the abdomen reveal- Non Tender, No Rebound tenderness, No rigidity(Guarding) and No Palpable abdominal masses.  Liver:-Normal.  Spleen:- Normal.  Back Mid lumbar spine tenderness to palpation/ and lt si tender Pain on straight leg lift. Pain on lateral movements and flexion/extension of the spine.  Lower ext neurologic  L5-S1 sensation intact bilaterally. Normal patellar reflexes bilaterally. No foot drop bilaterally.      Assessment & Plan:  For you back pain today high level will give toradol 60 mg IM.   Will get xray of lumbar spine.  Hold meloxicam today. Can restart tomorrow and  can use up to 15 mg a day.  DC flexeril since sedates too much. We could try zanaflex and see if less sedating  Follow up in 10-14 days or as needed.  Red flags sign and symptoms discussed that would indicate need for ED evaluation.  Giovanie Lefebre, Percell Miller, PA-C

## 2015-12-21 NOTE — Addendum Note (Signed)
Addended by: Rockwell Germany on: 12/21/2015 01:26 PM   Modules accepted: Orders

## 2015-12-21 NOTE — Progress Notes (Signed)
Pre visit review using our clinic review tool, if applicable. No additional management support is needed unless otherwise documented below in the visit note/SLS  

## 2015-12-29 ENCOUNTER — Ambulatory Visit (HOSPITAL_BASED_OUTPATIENT_CLINIC_OR_DEPARTMENT_OTHER): Payer: BLUE CROSS/BLUE SHIELD

## 2015-12-30 ENCOUNTER — Telehealth: Payer: Self-pay | Admitting: Family

## 2015-12-30 NOTE — Telephone Encounter (Signed)
Patient called with questions regarding her last appointment. She would like a call back. Please advise   Phone: [phone]

## 2016-01-01 ENCOUNTER — Emergency Department
Admission: EM | Admit: 2016-01-01 | Discharge: 2016-01-01 | Disposition: A | Discharge status: Home / Self Care | Payer: BC Managed Care – PPO | Attending: Emergency Medicine | Admitting: Emergency Medicine

## 2016-01-01 ENCOUNTER — Emergency Department: Payer: BC Managed Care – PPO

## 2016-01-01 DIAGNOSIS — M545 Low back pain, unspecified: Secondary | ICD-10-CM

## 2016-01-01 DIAGNOSIS — Z79899 Other long term (current) drug therapy: Secondary | ICD-10-CM | POA: Insufficient documentation

## 2016-01-01 DIAGNOSIS — I10 Essential (primary) hypertension: Secondary | ICD-10-CM | POA: Insufficient documentation

## 2016-01-01 DIAGNOSIS — Z79818 Long term (current) use of other agents affecting estrogen receptors and estrogen levels: Secondary | ICD-10-CM | POA: Insufficient documentation

## 2016-01-01 MED ORDER — DIAZEPAM 5 MG PO TABS
5.0000 mg | ORAL_TABLET | Freq: Once | ORAL | Status: AC
Start: 2016-01-01 — End: 2016-01-01
  Administered 2016-01-01: 5 mg via ORAL
  Filled 2016-01-01: qty 1

## 2016-01-01 MED ORDER — METHOCARBAMOL 750 MG PO TABS
750.0000 mg | ORAL_TABLET | Freq: Three times a day (TID) | ORAL | 0 refills | Status: AC | PRN
Start: 2016-01-01 — End: ?

## 2016-01-01 MED ORDER — KETOROLAC TROMETHAMINE 30 MG/ML IJ SOLN
30.0000 mg | Freq: Once | INTRAMUSCULAR | Status: AC
Start: 2016-01-01 — End: 2016-01-01
  Administered 2016-01-01: 30 mg via INTRAMUSCULAR
  Filled 2016-01-01: qty 1

## 2016-01-01 MED ORDER — MELOXICAM 7.5 MG PO TABS
7.5000 mg | ORAL_TABLET | Freq: Every day | ORAL | 0 refills | Status: AC
Start: 2016-01-01 — End: 2016-01-11

## 2016-01-01 NOTE — ED Triage Notes (Signed)
Lower back pain X1 month. S/p moving a person a bed. Pain is worse with movement. No urinary symptoms

## 2016-01-01 NOTE — Discharge Instructions (Signed)
Back Pain NOS     You have been seen for back pain.     Back pain can happen anywhere from the neck down to the low back. Back pain has many different causes. Some of the more common are: Bone pain, muscle strain, muscle spasm, pain from overuse, and pinched nerves. Other problems can cause what feels like back pain. But the pain is really coming from another organ. A kidney infection can cause lower back pain.     Your doctor did not find any pain over the bones in your back (even though you might have pain in the muscles of the back). This means it is very unlikely that you have a broken bone (fracture) in your back. Your doctor did not think it was necessary to take an x-ray.     The doctor still does not know the exact cause of your pain. Your problem does not seem to be from a dangerous cause. It is OK for you to go home today.     Some things you can try to help your back feel better are:  · Apply a warm damp washcloth to the back where you have pain for 20 minutes at a time, at least 4 times per day.  · Have someone massage the sore parts of your back.  · Don t do any heavy lifting or bending. You can go back to normal daily activities if they don t make the pain worse.  · You can use anti-inflammatory pain medicine for your pain. This could be Ibuprofen (Advil® or Motrin®). You can buy these at most stores. Follow the directions on the package.     This pain may last for the next few days. If your pain gets better, you probably do not need to see a doctor. However, if your symptoms get worse or you have new symptoms, you should return here or go to the nearest Emergency Department.     Call your doctor or go to the nearest Emergency Department if you your pain does not improve within 4 weeks or your pain is bad enough to seriously limit your normal activities.     YOU SHOULD SEEK MEDICAL ATTENTION IMMEDIATELY, EITHER HERE OR AT THE NEAREST EMERGENCY DEPARTMENT, IF ANY OF THE FOLLOWING OCCURS:  · You think  the pain is coming from somewhere other than your back. This can include chest pain. This is sometimes from angina (heart pains) or other dangerous causes.  · You have shortness of breath, sweating, chest pain (or pressure, heaviness, indigestion, etc).  · You have abdominal (belly) pain that goes through to your back.  · Your arms and legs tingle or get numb (lose feeling).  · Your arms or legs are weak.  · You lose control of your bladder or bowels. If this were to happen, it may cause you to wet or soil yourself.  · You have problems urinating (peeing).  · You have fever (temperature higher than 100.4ºF / 38ºC).  · Your pain gets worse.

## 2016-01-03 ENCOUNTER — Telehealth: Payer: Self-pay | Admitting: Medical

## 2016-01-03 NOTE — Telephone Encounter (Signed)
Pt missed work about 2 weeks after I saw her. So it would be best for her to be seen before work note written and agree with bereavement excuse/fmla assessment. She has appointment with pcp. If she expresses needs to be seen asap and pcp not available. Then I would be willing to see her.

## 2016-01-03 NOTE — Telephone Encounter (Addendum)
Patient seen by Percell Miller 12/21/15 for LBP, midline, w/left-side sciatica; patient wanted to schedule F/U with PCP, [name], for Tuesday or Wednesday of next week [when she will return to town after the loss of her sister], and to discuss 'work notes/FMLA'. Pt scheduled for Wed, 01/10/018 at 12:00p, provider assistant aware/SLS 01/02

## 2016-01-03 NOTE — Telephone Encounter (Addendum)
Patient called back and states that her employer has informed her that if she does not have "work note to excuse her for the dates of 12/27/15 through 12/30/15 that she is going to lose her job"; explained to patient that provider will not be back in office until Wed, 01/04/16 and also, she states that she has not came back in to office sooner d/t her sister passing away [leukemia] and she has been out of town and is not returning until next week for that matter; suggested also that she ask her employer about bereavement time, if available through employer, pt will check, as she is unsure. Informed patient to make sure that she has her FMLA paperwork from her employer with her when she returns to office for 01/11/16 appointment with PCP [as issue initiated with provider 12/08]; pt understood & agreed/SLS 01/02

## 2016-01-04 NOTE — Telephone Encounter (Signed)
Copy and paste information into open note RE: this matter, originated on 12/30/15.Marland KitchenSLS 01/03

## 2016-01-04 NOTE — Telephone Encounter (Addendum)
Relation to [id number] Call back number:[phone]   Reason for call:  Patient checking on the status of work excuse note for the following dates only 12/27/15 to 12/30/15. As per patient bereavement is not an option for the dates mentioned, please follow up with patient directly [phone]

## 2016-01-04 NOTE — Telephone Encounter (Signed)
Telephone Open    01/03/2016 [facility] [place], [place]  Family Medicine   Conversation  (Newest Message First)     01/03/16 9:55 PM  Mackie Pai, PA-C routed this conversation to Me  Mackie Pai, PA-C   01/03/16 9:53 PM  Note    Pt missed work about 2 weeks after I saw her. So it would be best for her to be seen before work note written and agree with bereavement excuse/fmla assessment. She has appointment with pcp. If she expresses needs to be seen asap and pcp not available. Then I would be willing to see her.

## 2016-01-04 NOTE — ED Provider Notes (Signed)
Physician/Midlevel provider first contact with patient: 01/01/16 1422         History     Chief Complaint   Patient presents with   . Back Pain     HPI   56 yo female with hx of htn   Presents with low back pain, with radiation to the b/l thighs. Onset one month ago.  With negative xr of spine and given mobic by her pmd. Onset s/p lifting.   Presents due to persistent pain with exacerbation today.  Running out of mobic and pmd office closed. Pain aching and worse with movement.   Pt denies changes in vision, hearing, sensation, strength or coordination.  Pt denies chest pain, sob, abdominal pain, nausea, vomiting, diarrhea and othe  extremity pain.    No trauma or fall. No fever. No head or neck pain. No dysuria, no hematuria, no polyuria.  No flank pain no incontinence.       Past Medical History:   Diagnosis Date   . Hypertension        History reviewed. No pertinent surgical history.    No family history on file.    Social  Social History   Substance Use Topics   . Smoking status: Never Smoker   . Smokeless tobacco: Never Used   . Alcohol use No       .     No Known Allergies    Home Medications             furosemide (LASIX) 10 MG/ML solution     Take by mouth daily.     hydroxychloroquine (PLAQUENIL) 200 MG tablet     Take by mouth daily.     lisinopril (PRINIVIL,ZESTRIL) 20 MG tablet     Take 20 mg by mouth daily.     metoprolol succinate XL (TOPROL-XL) 50 MG 24 hr tablet     Take 50 mg by mouth daily.     travoprost, benzalkonium, (TRAVATAN) 0.004 % ophthalmic solution     1 drop nightly.                     Review of Systems   Constitutional: Negative for chills and fever.   HENT: Negative for sore throat.    Eyes: Negative for visual disturbance.   Respiratory: Negative for cough and shortness of breath.    Cardiovascular: Negative for chest pain and leg swelling.   Gastrointestinal: Negative for abdominal pain, diarrhea, nausea and vomiting.   Endocrine: Negative for polyuria.   Genitourinary: Negative  for dysuria, flank pain, frequency and hematuria.   Musculoskeletal: Positive for back pain. Negative for arthralgias, gait problem, neck pain and neck stiffness.   Skin: Negative for wound.   Allergic/Immunologic: Negative for immunocompromised state.   Neurological: Negative for weakness, numbness and headaches.   Hematological: Does not bruise/bleed easily.   Psychiatric/Behavioral: Negative for agitation.       Physical Exam    BP: 176/81, Heart Rate: 64, Temp: 98.7 F (37.1 C), Resp Rate: 16, SpO2: 100 %, Weight: 92.5 kg    Physical Exam   Constitutional: She is oriented to person, place, and time. She appears well-developed and well-nourished. No distress.   HENT:   Head: Normocephalic and atraumatic.   Eyes: EOM are normal. Pupils are equal, round, and reactive to light.   Neck: Neck supple.   Cardiovascular: Normal rate, regular rhythm and normal heart sounds.  Exam reveals no gallop and no friction rub.  No murmur heard.  Pulmonary/Chest: Effort normal and breath sounds normal. No respiratory distress. She has no wheezes. She has no rales.   Abdominal: Soft. Bowel sounds are normal. She exhibits no distension. There is no tenderness. There is no rebound and no guarding.   Musculoskeletal: Normal range of motion. She exhibits no edema or tenderness.   No spinal tenderness cervical to lumbar   B/l paraspinal lumbar tenderness.   No cvat.    Neurological: She is alert and oriented to person, place, and time. No cranial nerve deficit. Coordination normal.   nml strength and sensation and gait.     Skin: Skin is warm and dry. No rash noted. No erythema.   Psychiatric: She has a normal mood and affect.         MDM and ED Course     ED Medication Orders     Start Ordered     Status Ordering Provider    01/01/16 1422 01/01/16 1421  ketorolac (TORADOL) injection 30 mg  Once     Route: Intramuscular  Ordered Dose: 30 mg     Last MAR action:  Given Dawnisha Marquina S    01/01/16 1422 01/01/16 1421  diazePAM  (VALIUM) tablet 5 mg  Once     Route: Oral  Ordered Dose: 5 mg     Last MAR action:  Given Davona Kinoshita S             MDM      ED Course              Procedures    Clinical Impression & Disposition     Clinical Impression  Final diagnoses:   Acute bilateral low back pain without sciatica    56 yo female with lumbar back pain, chronic with exacerbation with no trauma.  No warning signs.   Given toradol and valium   [place] with mobic and robaxin, f/u with primary care.  Told not to drive on robaxin.     ED Disposition     ED Disposition Condition Date/Time Comment    Discharge  Sun Jan 01, 2016  2:24 PM Judieth Keens Radel discharge to home/self care.    Condition at disposition: Stable           Discharge Medication List as of 01/01/2016  2:24 PM      START taking these medications    Details   methocarbamol (ROBAXIN) 750 MG tablet Take 1 tablet (750 mg total) by mouth 3 (three) times daily as needed (for spasm - do not drive on this.).for up to 15 doses, Starting Sun 01/01/2016, Print                       Daylene Posey, MD  01/04/16 [phone]

## 2016-01-06 NOTE — Telephone Encounter (Signed)
Patient was Informed and appointment was scheduled with PCP [who originally saw patient for this issue] for 01/11/16 and to bring FMLA paperwork with her to OV/SLS  Telephone Open    01/03/2016 [facility] [place], [place]  Family Medicine   Conversation  (Newest Message First)    01/03/16 9:55 PM  Mackie Pai, PA-C routed this conversation to Me  Mackie Pai, PA-C   01/03/16 9:53 PM  Note    Pt missed work about 2 weeks after I saw her. So it would be best for her to be seen before work note written and agree with bereavement excuse/fmla assessment. She has appointment with pcp. If she expresses needs to be seen asap and pcp not available. Then I would be willing to see her.

## 2016-01-11 ENCOUNTER — Ambulatory Visit: Payer: BLUE CROSS/BLUE SHIELD | Admitting: Family

## 2016-01-12 ENCOUNTER — Ambulatory Visit (INDEPENDENT_AMBULATORY_CARE_PROVIDER_SITE_OTHER): Payer: BLUE CROSS/BLUE SHIELD | Admitting: Medical

## 2016-01-12 ENCOUNTER — Encounter: Payer: Self-pay | Admitting: Medical

## 2016-01-12 ENCOUNTER — Ambulatory Visit (HOSPITAL_BASED_OUTPATIENT_CLINIC_OR_DEPARTMENT_OTHER)
Admission: RE | Admit: 2016-01-12 | Discharge: 2016-01-12 | Disposition: A | Discharge status: Home / Self Care | Payer: BLUE CROSS/BLUE SHIELD | Source: Ambulatory Visit | Attending: Family | Admitting: Family

## 2016-01-12 VITALS — BP 144/68 | HR 69 | Temp 98.3°F | Ht 61.0 in | Wt 199.8 lb

## 2016-01-12 DIAGNOSIS — Z1231 Encounter for screening mammogram for malignant neoplasm of breast: Secondary | ICD-10-CM | POA: Insufficient documentation

## 2016-01-12 DIAGNOSIS — M545 Low back pain: Secondary | ICD-10-CM

## 2016-01-12 DIAGNOSIS — Z0000 Encounter for general adult medical examination without abnormal findings: Secondary | ICD-10-CM

## 2016-01-12 MED ORDER — MELOXICAM 7.5 MG PO TABS: ORAL_TABLET | ORAL | 1 refills | Status: DC

## 2016-01-12 MED ORDER — TRAMADOL HCL 50 MG PO TABS: ORAL_TABLET | ORAL | 0 refills | Status: DC

## 2016-01-12 MED ORDER — CYCLOBENZAPRINE HCL 5 MG PO TABS: ORAL_TABLET | ORAL | 0 refills | Status: DC

## 2016-01-12 NOTE — Progress Notes (Signed)
Pre visit review using our clinic review tool, if applicable. No additional management support is needed unless otherwise documented below in the visit note. 

## 2016-01-12 NOTE — Patient Instructions (Signed)
For you back pain will write return to work note for Monday with no limitation at your request since you have fear of losing your job. I will however fill out the fmla form and try to provide you with some days off intermittently when your back pain flares.   Will continue the meloxicam, flexeril and provide some tramadol in event back pain flares when you start back to work. Rx advisement given on meds.  Will refer to sports medicine since back pain persists. They may recommend PT.  Follow up in 2-3 wks pcp or me.(If you feel that repeat tramadol refills will be necessary ask you schedule with pcp since long term repeat refills of narcotics would need to be approved by pcp.

## 2016-01-12 NOTE — Progress Notes (Signed)
Subjective:    Patient ID: Amber [name], female    DOB: 09-Jan-1960, 56 y.o.   MRN: [id number]  HPI  Pt in states her back is still hurting but not as bad as it was before. Pt states works for [employer].  Pt feels like she might be able to get back to work. Pt feels that if she does not go back to work she might have a job.   Pt wants to have rest of week off and wants to return to work with no limitations.   I saw pt one time and she had normal xray of lumbar spine. I had given her meloixcam and flexeril. Pt states her pain level was level 4-5/10 since I last saw her. No incontinence. No leg weakness and no foot drop. No radicular pain.   Pt states her sister oncologist filled out paper work for her end of November until Dec 15th so she could take care of her sister who had esophageal cancer. But she pulled/suffered back injury and then needed more time due to back pain and continued need to help with sister.   Pt needs fmla paperwork. Filling out based on pt recollection of days and review of chart.      Review of Systems  Constitutional: Negative for chills, fatigue and fever.  Respiratory: Negative for cough, chest tightness, shortness of breath and wheezing.   Cardiovascular: Negative for chest pain and palpitations.  Gastrointestinal: Negative for abdominal pain.  Genitourinary: Negative for difficulty urinating, dysuria and genital sores.  Musculoskeletal: Positive for back pain.  Neurological: Negative for dizziness and numbness.       No radicular pain.  Hematological: Negative for adenopathy. Does not bruise/bleed easily.   Past Medical History:  Diagnosis Date  . Anemia   . Arthritis   . Blood transfusion without reported diagnosis   . Colon polyps   . Ectopic pregnancy    RIGHT salpingostomy  . Elective abortion   . GERD (gastroesophageal reflux disease)   . Glaucoma    both eyes  . Headache    migraines  . History of chicken pox   . Hypertension   .  Rheumatoid arthritis(714.0) 11/07/2011  . SAB (spontaneous abortion)   . Sjogren's disease [facility])      Social History   Social History  . Marital status: Single    Spouse name: N/A  . Number of children: 0  . 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 Smoking status: Never Smoker  . Smokeless tobacco: Never Used  . Alcohol use No  . Drug use: No  . Sexual activity: Yes    Birth control/ protection: None     Comment: 1st intercourse- 2, partners- 5   Other Topics Concern  . Not on file   Social History Narrative   Regular exercise:  No   Caffeine Use: 2 cups coffee daily   No children   "Happily divorced"   Reports that she is involved at her church   Works with intellectually challenged adults   Born [place]- she came to [place] as adult.  Age 74.              Past Surgical History:  Procedure Laterality Date  . ABDOMINAL SURGERY     bowel repair  . COLONOSCOPY    . DILATION AND CURETTAGE OF UTERUS     X 2  . MENISCUS  REPAIR Right    MENISCUS TEAR REPAIR  . MYOMECTOMY N/A 07/27/2014   Procedure: ABDOMINAL MYOMECTOMY;  Surgeon: Waymon Amato, MD;  Location: [facility];  Service: Gynecology;  Laterality: N/A;  . PELVIC LAPAROSCOPY  1994   IN 1999 LAPAROSCOPY WITH INCIDENTAL ENTEROTOMY REQUIRING LAPAROTOMY  . UTERINE FIBROID SURGERY     July 2016    Family History  Problem Relation Age of Onset  . Hypertension Mother     died from heart disease  . Heart disease Mother     deceased, unknown cause  . Diabetes Sister   . Hypertension Sister   . Esophageal cancer Sister   . Stomach cancer Sister   . Cancer Sister   . Diabetes Sister   . Rectal cancer Neg Hx   . Colon cancer Neg Hx     No Known Allergies  Current Outpatient Prescriptions on File Prior to Visit  Medication Sig Dispense Refill  . Cyanocobalamin (VITAMIN B-12) 5000 MCG LOZG Take 1 tablet by mouth 2 (two) times daily.    . ferrous sulfate 325  (65 FE) MG tablet Take 325 mg by mouth 3 (three) times daily with meals.     . furosemide (LASIX) 20 MG tablet Take 1 tablet (20 mg total) by mouth daily as needed for fluid. 90 tablet 1  . hydroxychloroquine (PLAQUENIL) 200 MG tablet Take 200 mg by mouth 2 (two) times daily.     Marland Kitchen lisinopril (PRINIVIL,ZESTRIL) 20 MG tablet Take 1 tablet (20 mg total) by mouth daily. 90 tablet 1  . meloxicam (MOBIC) 7.5 MG tablet Take 1 tablet (7.5 mg total) by mouth daily. (Patient taking differently: Take 7.5 mg by mouth 2 (two) times daily. ) 14 tablet 0  . meloxicam (MOBIC) 7.5 MG tablet 1-2 tab po q day for back pain. 30 tablet 1  . metoprolol succinate (TOPROL-XL) 50 MG 24 hr tablet Take 1 tablet (50 mg total) by mouth daily. Take with or immediately following a meal. 90 tablet 1  . ondansetron (ZOFRAN ODT) 4 MG disintegrating tablet Take 1 tablet (4 mg total) by mouth every 8 (eight) hours as needed for nausea or vomiting. 20 tablet 0  . potassium chloride (K-DUR) 10 MEQ tablet Take 2 tablets (20 mEq total) by mouth daily. On the days you take lasix 90 tablet 1  . SUMAtriptan (IMITREX) 50 MG tablet Take 1 tablet (50 mg total) by mouth every 2 (two) hours as needed for migraine. May repeat in 2 hours if headache persists or recurs. 10 tablet 0  . tiZANidine (ZANAFLEX) 4 MG capsule 1 tab po q hs as needed back spasms 10 capsule 0  . TRAVATAN Z 0.004 % SOLN ophthalmic solution Place 1 drop into both eyes at bedtime.      Current Facility-Administered Medications on File Prior to Visit  Medication Dose Route Frequency Provider Last Rate Last Dose  . 0.9 %  sodium chloride infusion  500 mL Intravenous Continuous Nelida Meuse III, MD        BP (!) 144/68 (BP Location: Right Arm, Patient Position: Sitting, Cuff Size: Large)   Pulse 69   Temp 98.3 F (36.8 C) (Oral)   Ht 5\' 1"  (1.549 m)   Wt 199 lb 12.8 oz (90.6 kg)   LMP 12/12/2015   SpO2 100% Comment: RA  BMI 37.75 kg/m       Objective:   Physical  Exam  General Appearance- Not in acute distress.    Chest and Lung  Exam Auscultation: Breath sounds:-Normal. Clear even and unlabored. Adventitious sounds:- No Adventitious sounds.  Cardiovascular Auscultation:Rythm - Regular, rate and rythm. Heart Sounds -Normal heart sounds.  Abdomen Inspection:-Inspection Normal.  Palpation/Perucssion: Palpation and Percussion of the abdomen reveal- Non Tender, No Rebound tenderness, No rigidity(Guarding) and No Palpable abdominal masses.  Liver:-Normal.  Spleen:- Normal.   Back Mid lumbar spine tenderness to palpation mild but also bilateral Si tenderness. Pain on changing position. Pain on straight leg lift. Pain on lateral movements and flexion/extension of the spine.  Lower ext neurologic  L5-S1 sensation intact bilaterally. Normal patellar reflexes bilaterally. No foot drop bilaterally.      Assessment & Plan:  For you back pain will write return to work note for Monday with no limitation at your request since you have fear of losing your job. I will however fill out the fmla form and try to provide you with some days off intermittently when your back pain flares.   Will continue the meloxicam, flexeril and provide some tramadol in event back pain flares when you start back to work. Rx advisement given on meds.  Will refer to sports medicine since back pain persists. They may recommend PT.  Follow up in 2-3 wks pcp or me.(If you feel that repeat tramadol refills will be necessary ask you schedule with pcp since long term repeat refills of narcotics would need to be approved by pcp.  Duffy Dantonio, Percell Miller, PA-C

## 2016-01-17 ENCOUNTER — Ambulatory Visit: Payer: BLUE CROSS/BLUE SHIELD | Admitting: Family

## 2016-01-17 ENCOUNTER — Encounter: Payer: Self-pay | Admitting: Family Medicine

## 2016-01-17 ENCOUNTER — Ambulatory Visit (INDEPENDENT_AMBULATORY_CARE_PROVIDER_SITE_OTHER): Payer: BLUE CROSS/BLUE SHIELD | Admitting: Family Medicine

## 2016-01-17 VITALS — BP 163/98 | HR 68 | Ht 61.0 in | Wt 199.0 lb

## 2016-01-17 DIAGNOSIS — M545 Low back pain: Secondary | ICD-10-CM | POA: Diagnosis not present

## 2016-01-17 MED ORDER — METHOCARBAMOL 500 MG PO TABS: 500.0000 mg | ORAL_TABLET | Freq: Three times a day (TID) | ORAL | 1 refills | Status: DC | PRN

## 2016-01-17 MED ORDER — DICLOFENAC SODIUM 75 MG PO TBEC: 75.0000 mg | DELAYED_RELEASE_TABLET | Freq: Two times a day (BID) | ORAL | 1 refills | Status: DC

## 2016-01-17 NOTE — Patient Instructions (Signed)
You have a severe lumbar strain. It's ok to take tylenol for baseline pain relief (1-2 extra strength tabs 3x/day) Voltaren twice a day with food food for pain and inflammation. Robaxin as needed for muscle spasms - try half a tablet 30-60 minutes before bedtime to make sure this doesn't make you sleepy (usually doesn't) before you take it during the day. Stay as active as possible. Consider massage, chiropractor, physical therapy, and/or acupuncture. Physical therapy has been shown to be helpful while the others have mixed results - start this and do home exercises on days you don't go to therapy. Strengthening of low back muscles, abdominal musculature are key for long term pain relief. Follow up with me in 1 month.

## 2016-01-18 ENCOUNTER — Ambulatory Visit: Payer: BLUE CROSS/BLUE SHIELD | Admitting: Family

## 2016-01-20 ENCOUNTER — Telehealth: Payer: Self-pay | Admitting: Family

## 2016-01-20 ENCOUNTER — Encounter: Payer: Self-pay | Admitting: Family

## 2016-01-20 ENCOUNTER — Ambulatory Visit (INDEPENDENT_AMBULATORY_CARE_PROVIDER_SITE_OTHER): Payer: BLUE CROSS/BLUE SHIELD | Admitting: Family

## 2016-01-20 VITALS — BP 152/78 | HR 90 | Temp 98.5°F | Resp 18 | Ht 61.0 in | Wt 202.0 lb

## 2016-01-20 DIAGNOSIS — M545 Low back pain: Secondary | ICD-10-CM | POA: Diagnosis not present

## 2016-01-20 NOTE — Progress Notes (Signed)
Pre visit review using our clinic review tool, if applicable. No additional management support is needed unless otherwise documented below in the visit note. 

## 2016-01-20 NOTE — Progress Notes (Signed)
Subjective:    Patient ID: Amber [name], female    DOB: 04/10/1960, 56 y.o.   MRN: [id number]  HPI  Amber [name] is a 56 yr old female who presents today to discuss back pain.  Patient reports back pain began on 12/10/15.  She reports that she injured her back caring for her sister.   Needs an FMLA.  She returned to work 1/15. She reports some ongoing low back pain which occasionally radiates into the left anterior thigh.  However she reports that her back pain has improved overall.      Review of Systems See HPI  Past Medical History:  Diagnosis Date  . Anemia   . Arthritis   . Blood transfusion without reported diagnosis   . Colon polyps   . Ectopic pregnancy    RIGHT salpingostomy  . Elective abortion   . GERD (gastroesophageal reflux disease)   . Glaucoma    both eyes  . Headache    migraines  . History of chicken pox   . Hypertension   . Rheumatoid arthritis(714.0) 11/07/2011  . SAB (spontaneous abortion)   . Sjogren's disease [facility])      Social History   Social History  . Marital status: Single    Spouse name: N/A  . Number of children: 0  . 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 Smoking status: Never Smoker  . Smokeless tobacco: Never Used  . Alcohol use No  . Drug use: No  . Sexual activity: Yes    Birth control/ protection: None     Comment: 1st intercourse- 61, partners- 5   Other Topics Concern  . Not on file   Social History Narrative   Regular exercise:  No   Caffeine Use: 2 cups coffee daily   No children   "Happily divorced"   Reports that she is involved at her church   Works with intellectually challenged adults   Born [place]- she came to [place] as adult.  Age 41.              Past Surgical History:  Procedure Laterality Date  . ABDOMINAL SURGERY     bowel repair  . COLONOSCOPY    . DILATION AND CURETTAGE OF UTERUS     X 2  . MENISCUS REPAIR Right    MENISCUS TEAR  REPAIR  . MYOMECTOMY N/A 07/27/2014   Procedure: ABDOMINAL MYOMECTOMY;  Surgeon: Waymon Amato, MD;  Location: [facility];  Service: Gynecology;  Laterality: N/A;  . PELVIC LAPAROSCOPY  1994   IN 1999 LAPAROSCOPY WITH INCIDENTAL ENTEROTOMY REQUIRING LAPAROTOMY  . UTERINE FIBROID SURGERY     July 2016    Family History  Problem Relation Age of Onset  . Hypertension Mother     died from heart disease  . Heart disease Mother     deceased, unknown cause  . Diabetes Sister   . Hypertension Sister   . Esophageal cancer Sister   . Stomach cancer Sister   . Cancer Sister   . Diabetes Sister   . Rectal cancer Neg Hx   . Colon cancer Neg Hx     No Known Allergies  Current Outpatient Prescriptions on File Prior to Visit  Medication Sig Dispense Refill  . Cyanocobalamin (VITAMIN B-12) 5000 MCG LOZG Take 1 tablet by mouth 2 (two) times daily.    . ferrous sulfate 325 (65 FE) MG tablet  Take 325 mg by mouth 3 (three) times daily with meals.     . furosemide (LASIX) 20 MG tablet Take 1 tablet (20 mg total) by mouth daily as needed for fluid. 90 tablet 1  . hydroxychloroquine (PLAQUENIL) 200 MG tablet Take 200 mg by mouth 2 (two) times daily.     Marland Kitchen lisinopril (PRINIVIL,ZESTRIL) 20 MG tablet Take 1 tablet (20 mg total) by mouth daily. 90 tablet 1  . methocarbamol (ROBAXIN) 500 MG tablet Take 1 tablet (500 mg total) by mouth every 8 (eight) hours as needed. 60 tablet 1  . metoprolol succinate (TOPROL-XL) 50 MG 24 hr tablet Take 1 tablet (50 mg total) by mouth daily. Take with or immediately following a meal. 90 tablet 1  . potassium chloride (K-DUR) 10 MEQ tablet Take 2 tablets (20 mEq total) by mouth daily. On the days you take lasix 90 tablet 1  . SUMAtriptan (IMITREX) 50 MG tablet Take 1 tablet (50 mg total) by mouth every 2 (two) hours as needed for migraine. May repeat in 2 hours if headache persists or recurs. 10 tablet 0  . TRAVATAN Z 0.004 % SOLN ophthalmic solution Place 1 drop into both eyes at  bedtime.     . diclofenac (VOLTAREN) 75 MG EC tablet Take 1 tablet (75 mg total) by mouth 2 (two) times daily. (Patient not taking: Reported on 01/20/2016) 60 tablet 1   Current Facility-Administered Medications on File Prior to Visit  Medication Dose Route Frequency Provider Last Rate Last Dose  . 0.9 %  sodium chloride infusion  500 mL Intravenous Continuous Nelida Meuse III, MD        BP (!) 152/78 (BP Location: Right Arm, Cuff Size: Large)   Pulse 90   Temp 98.5 F (36.9 C) (Oral)   Resp 18   Ht 5\' 1"  (1.549 m)   Wt 202 lb (91.6 kg)   LMP 12/12/2015   SpO2 100% Comment: room air  BMI 38.17 kg/m       Objective:   Physical Exam  Constitutional: She appears well-developed and well-nourished.  Cardiovascular: Normal rate, regular rhythm and normal heart sounds.   No murmur heard. Pulmonary/Chest: Effort normal and breath sounds normal. No respiratory distress. She has no wheezes.  Musculoskeletal:       Cervical back: She exhibits no tenderness.       Thoracic back: She exhibits no tenderness.       Lumbar back: She exhibits no bony tenderness.  Psychiatric: She has a normal mood and affect. Her behavior is normal. Judgment and thought content normal.  Musc: bilateral LE strength is 5/5        Assessment & Plan:  Low back pain- improving. We tried to get a copy of her FMLA from her employer (they provided FMLA for family member form initially) however their fax is down. They will send to [place] early next week for completion.

## 2016-01-20 NOTE — Telephone Encounter (Signed)
Patient called stating that she had an appt scheduled for 01/18/16 to get her FMLA paperwork filled out. She needed to reschedule due to the office closing for the weather. She states that her work needs the paperwork filled out and sent in by Monday 01/21/16. Patient stated that she was told that she could be fit in to any slot on the schedule however there isn't anything available today. I have her scheduled for 01/24/16. Please advise.   Phone: [phone]

## 2016-01-20 NOTE — Patient Instructions (Signed)
We will contact you with your paperwork when it is complete.

## 2016-01-20 NOTE — Telephone Encounter (Signed)
You can work her in to my schedule today please.

## 2016-01-20 NOTE — Telephone Encounter (Signed)
Spoke with pt. She states she saw PA Edward on 01/12/16 and was told he was going to forward the FMLA papers to be completed by PCP, Inda Castle. Pt states she scheduled an appt to see PCP on Tuesday but had to cancel due to emergency with her child. She rescheduled for Wednesday but we were closed due to the snow.  [name] please advise?

## 2016-01-23 ENCOUNTER — Telehealth: Payer: Self-pay | Admitting: Family

## 2016-01-23 NOTE — Assessment & Plan Note (Signed)
2/2 severe lumbar strain and spasms (mainly of deep posture muscles).  Tylenol, voltaren.  Try robaxin for spasms.  Start physical therapy and home exercises.  F/u in 1 month.

## 2016-01-23 NOTE — Telephone Encounter (Signed)
°  Relation to [id number] Call back number: [phone]   Reason for call:  Patient wanted to confirm if FMLA paperwork was received via email, please advise

## 2016-01-23 NOTE — Progress Notes (Signed)
PCP: [name]., NP Consultation requested by: Mackie Pai PA-C  Subjective:   HPI: Patient is a 56 y.o. female here for low back pain.  Patient reports she's had 1 month of left sided low back pain radiating to right side. No acute injury or trauma. She states pain started after caring for her sister - was doing more lifting, moving, transferring and thinks pain started a couple days after doing this. Worse with sleeping, prolonged sitting. Tried mobic, flexeril, ibuprofen, heat, thermacare patches. Flexeril made her too sleepy. No bowel/bladder dysfunction. No radiation into extremities. No numbness or tingling.  Past Medical History:  Diagnosis Date  . Anemia   . Arthritis   . Blood transfusion without reported diagnosis   . Colon polyps   . Ectopic pregnancy    RIGHT salpingostomy  . Elective abortion   . GERD (gastroesophageal reflux disease)   . Glaucoma    both eyes  . Headache    migraines  . History of chicken pox   . Hypertension   . Rheumatoid arthritis(714.0) 11/07/2011  . SAB (spontaneous abortion)   . Sjogren's disease [facility])     Current Outpatient Prescriptions on File Prior to Visit  Medication Sig Dispense Refill  . Cyanocobalamin (VITAMIN B-12) 5000 MCG LOZG Take 1 tablet by mouth 2 (two) times daily.    . ferrous sulfate 325 (65 FE) MG tablet Take 325 mg by mouth 3 (three) times daily with meals.     . furosemide (LASIX) 20 MG tablet Take 1 tablet (20 mg total) by mouth daily as needed for fluid. 90 tablet 1  . hydroxychloroquine (PLAQUENIL) 200 MG tablet Take 200 mg by mouth 2 (two) times daily.     Marland Kitchen lisinopril (PRINIVIL,ZESTRIL) 20 MG tablet Take 1 tablet (20 mg total) by mouth daily. 90 tablet 1  . metoprolol succinate (TOPROL-XL) 50 MG 24 hr tablet Take 1 tablet (50 mg total) by mouth daily. Take with or immediately following a meal. 90 tablet 1  . potassium chloride (K-DUR) 10 MEQ tablet Take 2 tablets (20 mEq total) by mouth daily.  On the days you take lasix 90 tablet 1  . SUMAtriptan (IMITREX) 50 MG tablet Take 1 tablet (50 mg total) by mouth every 2 (two) hours as needed for migraine. May repeat in 2 hours if headache persists or recurs. 10 tablet 0  . TRAVATAN Z 0.004 % SOLN ophthalmic solution Place 1 drop into both eyes at bedtime.      Current Facility-Administered Medications on File Prior to Visit  Medication Dose Route Frequency Provider Last Rate Last Dose  . 0.9 %  sodium chloride infusion  500 mL Intravenous Continuous Doran Stabler, MD        Past Surgical History:  Procedure Laterality Date  . ABDOMINAL SURGERY     bowel repair  . COLONOSCOPY    . DILATION AND CURETTAGE OF UTERUS     X 2  . MENISCUS REPAIR Right    MENISCUS TEAR REPAIR  . MYOMECTOMY N/A 07/27/2014   Procedure: ABDOMINAL MYOMECTOMY;  Surgeon: Waymon Amato, MD;  Location: [facility];  Service: Gynecology;  Laterality: N/A;  . PELVIC LAPAROSCOPY  1994   IN 1999 LAPAROSCOPY WITH INCIDENTAL ENTEROTOMY REQUIRING LAPAROTOMY  . UTERINE FIBROID SURGERY     July 2016    No Known Allergies  Social History   Social History  . Marital status: Single    Spouse name: N/A  . Number of children: 0  .  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 Smoking status: Never Smoker  . Smokeless tobacco: Never Used  . Alcohol use No  . Drug use: No  . Sexual activity: Yes    Birth control/ protection: None     Comment: 1st intercourse- 53, partners- 5   Other Topics Concern  . Not on file   Social History Narrative   Regular exercise:  No   Caffeine Use: 2 cups coffee daily   No children   "Happily divorced"   Reports that she is involved at her church   Works with intellectually challenged adults   Born in [place]- she came to [place] as adult.  Age 80.              Family History  Problem Relation Age of Onset  . Hypertension Mother     died from heart disease  . Heart disease  Mother     deceased, unknown cause  . Diabetes Sister   . Hypertension Sister   . Esophageal cancer Sister   . Stomach cancer Sister   . Cancer Sister   . Diabetes Sister   . Rectal cancer Neg Hx   . Colon cancer Neg Hx     BP (!) 163/98   Pulse 68   Ht 5\' 1"  (1.549 m)   Wt 199 lb (90.3 kg)   LMP 12/12/2015   BMI 37.60 kg/m   Review of Systems: See HPI above.     Objective:  Physical Exam:  Gen: NAD, comfortable in exam room  Back: No gross deformity, scoliosis. TTP minimally left paraspinal lumbar region.  No midline or bony TTP. FROM with pain on flexion. Strength LEs 5/5 all muscle groups.   2+ MSRs in patellar and achilles tendons, equal bilaterally. Negative SLRs. Sensation intact to light touch bilaterally. Negative logroll bilateral hips Negative fabers and piriformis stretches.   Assessment & Plan:  1. Low back pain - 2/2 severe lumbar strain and spasms (mainly of deep posture muscles).  Tylenol, voltaren.  Try robaxin for spasms.  Start physical therapy and home exercises.  F/u in 1 month.

## 2016-01-23 NOTE — Telephone Encounter (Signed)
Patient was seen 01/20/16 @ 3:00pm

## 2016-01-23 NOTE — Telephone Encounter (Signed)
Email, forms received, printed and forwarded to PCP's red folder for completion on Tuesday.

## 2016-01-24 ENCOUNTER — Ambulatory Visit: Payer: BLUE CROSS/BLUE SHIELD | Admitting: Family

## 2016-01-25 NOTE — Telephone Encounter (Signed)
Form completed.

## 2016-01-25 NOTE — Telephone Encounter (Signed)
Original sent to scanning.

## 2016-01-25 NOTE — Telephone Encounter (Signed)
Completed forms emailed to pt at [email or web address].

## 2016-01-29 ENCOUNTER — Telehealth: Payer: Self-pay | Admitting: Oncology

## 2016-01-29 NOTE — Telephone Encounter (Signed)
Lvm advising appt changed from 2/2 to 3/9 @ 2.30.

## 2016-02-01 ENCOUNTER — Other Ambulatory Visit: Payer: Self-pay

## 2016-02-01 MED ORDER — SUMATRIPTAN SUCCINATE 50 MG PO TABS: 50.0000 mg | ORAL_TABLET | ORAL | 0 refills | Status: DC | PRN

## 2016-02-03 ENCOUNTER — Ambulatory Visit: Payer: BLUE CROSS/BLUE SHIELD | Admitting: Oncology

## 2016-02-03 ENCOUNTER — Other Ambulatory Visit: Payer: BLUE CROSS/BLUE SHIELD

## 2016-02-06 ENCOUNTER — Ambulatory Visit (INDEPENDENT_AMBULATORY_CARE_PROVIDER_SITE_OTHER): Payer: BLUE CROSS/BLUE SHIELD | Admitting: Family

## 2016-02-06 ENCOUNTER — Ambulatory Visit (HOSPITAL_BASED_OUTPATIENT_CLINIC_OR_DEPARTMENT_OTHER)
Admission: RE | Admit: 2016-02-06 | Discharge: 2016-02-06 | Disposition: A | Discharge status: Home / Self Care | Payer: BLUE CROSS/BLUE SHIELD | Source: Ambulatory Visit | Attending: Family | Admitting: Family

## 2016-02-06 ENCOUNTER — Encounter: Payer: Self-pay | Admitting: Family

## 2016-02-06 ENCOUNTER — Telehealth: Payer: Self-pay | Admitting: Family

## 2016-02-06 ENCOUNTER — Telehealth: Payer: Self-pay | Admitting: Oncology

## 2016-02-06 VITALS — BP 166/96 | HR 107 | Temp 101.1°F | Ht 61.0 in | Wt 199.4 lb

## 2016-02-06 DIAGNOSIS — I10 Essential (primary) hypertension: Secondary | ICD-10-CM | POA: Diagnosis not present

## 2016-02-06 DIAGNOSIS — H6691 Otitis media, unspecified, right ear: Secondary | ICD-10-CM

## 2016-02-06 DIAGNOSIS — R918 Other nonspecific abnormal finding of lung field: Secondary | ICD-10-CM | POA: Insufficient documentation

## 2016-02-06 DIAGNOSIS — R05 Cough: Secondary | ICD-10-CM

## 2016-02-06 DIAGNOSIS — R509 Fever, unspecified: Secondary | ICD-10-CM | POA: Insufficient documentation

## 2016-02-06 DIAGNOSIS — R059 Cough, unspecified: Secondary | ICD-10-CM

## 2016-02-06 LAB — POCT INFLUENZA B: RAPID INFLUENZA B AGN: NEGATIVE

## 2016-02-06 MED ORDER — AMOXICILLIN-POT CLAVULANATE 875-125 MG PO TABS: 1.0000 | ORAL_TABLET | Freq: Two times a day (BID) | ORAL | 0 refills | Status: DC

## 2016-02-06 MED ORDER — GUAIFENESIN-CODEINE 100-10 MG/5ML PO SYRP: 5.0000 mL | ORAL_SOLUTION | Freq: Every evening | ORAL | 0 refills | Status: DC | PRN

## 2016-02-06 NOTE — Telephone Encounter (Signed)
Pt called to r/s 3/9 appt to 3/16 due to work schedule conflict. Gave pt new appt date/time

## 2016-02-06 NOTE — Progress Notes (Signed)
Subjective:    Patient ID: Amber [name], female    DOB: 07-Apr-1960, 56 y.o.   MRN: [id number]  HPI  Amber [name] is a 56 yr old female who presents today with c/o cough and chills and body aches.  Symptoms began on Thursday. She has taken sudafed and tylenol cold/sinus without improvement.   Review of Systems See HPI  Past Medical History:  Diagnosis Date  . Anemia   . Arthritis   . Blood transfusion without reported diagnosis   . Colon polyps   . Ectopic pregnancy    RIGHT salpingostomy  . Elective abortion   . GERD (gastroesophageal reflux disease)   . Glaucoma    both eyes  . Headache    migraines  . History of chicken pox   . Hypertension   . Rheumatoid arthritis(714.0) 11/07/2011  . SAB (spontaneous abortion)   . Sjogren's disease [facility])      Social History   Social History  . Marital status: Single    Spouse name: N/A  . Number of children: 0  . 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 Smoking status: Never Smoker  . Smokeless tobacco: Never Used  . Alcohol use No  . Drug use: No  . Sexual activity: Yes    Birth control/ protection: None     Comment: 1st intercourse- 38, partners- 5   Other Topics Concern  . Not on file   Social History Narrative   Regular exercise:  No   Caffeine Use: 2 cups coffee daily   No children   "Happily divorced"   Reports that she is involved at her church   Works with intellectually challenged adults   Born in [place]- she came to [place] as adult.  Age 4.              Past Surgical History:  Procedure Laterality Date  . ABDOMINAL SURGERY     bowel repair  . COLONOSCOPY    . DILATION AND CURETTAGE OF UTERUS     X 2  . MENISCUS REPAIR Right    MENISCUS TEAR REPAIR  . MYOMECTOMY N/A 07/27/2014   Procedure: ABDOMINAL MYOMECTOMY;  Surgeon: Waymon Amato, MD;  Location: [facility];  Service: Gynecology;  Laterality: N/A;  . PELVIC LAPAROSCOPY  1994   IN 1999  LAPAROSCOPY WITH INCIDENTAL ENTEROTOMY REQUIRING LAPAROTOMY  . UTERINE FIBROID SURGERY     July 2016    Family History  Problem Relation Age of Onset  . Hypertension Mother     died from heart disease  . Heart disease Mother     deceased, unknown cause  . Diabetes Sister   . Hypertension Sister   . Esophageal cancer Sister   . Stomach cancer Sister   . Cancer Sister   . Diabetes Sister   . Rectal cancer Neg Hx   . Colon cancer Neg Hx     No Known Allergies  Current Outpatient Prescriptions on File Prior to Visit  Medication Sig Dispense Refill  . Cyanocobalamin (VITAMIN B-12) 5000 MCG LOZG Take 1 tablet by mouth 2 (two) times daily.    . diclofenac (VOLTAREN) 75 MG EC tablet Take 1 tablet (75 mg total) by mouth 2 (two) times daily. 60 tablet 1  . ferrous sulfate 325 (65 FE) MG tablet Take 325 mg by mouth 3 (three) times daily with meals.     . furosemide (LASIX) 20  MG tablet Take 1 tablet (20 mg total) by mouth daily as needed for fluid. 90 tablet 1  . hydroxychloroquine (PLAQUENIL) 200 MG tablet Take 200 mg by mouth 2 (two) times daily.     Marland Kitchen lisinopril (PRINIVIL,ZESTRIL) 20 MG tablet Take 1 tablet (20 mg total) by mouth daily. 90 tablet 1  . methocarbamol (ROBAXIN) 500 MG tablet Take 1 tablet (500 mg total) by mouth every 8 (eight) hours as needed. 60 tablet 1  . metoprolol succinate (TOPROL-XL) 50 MG 24 hr tablet Take 1 tablet (50 mg total) by mouth daily. Take with or immediately following a meal. 90 tablet 1  . potassium chloride (K-DUR) 10 MEQ tablet Take 2 tablets (20 mEq total) by mouth daily. On the days you take lasix 90 tablet 1  . SUMAtriptan (IMITREX) 50 MG tablet Take 1 tablet (50 mg total) by mouth every 2 (two) hours as needed for migraine. May repeat in 2 hours if headache persists or recurs. 10 tablet 0  . TRAVATAN Z 0.004 % SOLN ophthalmic solution Place 1 drop into both eyes at bedtime.      Current Facility-Administered Medications on File Prior to Visit    Medication Dose Route Frequency Provider Last Rate Last Dose  . 0.9 %  sodium chloride infusion  500 mL Intravenous Continuous Nelida Meuse III, MD        BP (!) 166/96 (BP Location: Right Arm, Cuff Size: Large)   Pulse (!) 107   Temp (!) 101.1 F (38.4 C) (Oral)   Ht 5\' 1"  (1.549 m)   Wt 199 lb 6.4 oz (90.4 kg)   SpO2 100%   BMI 37.68 kg/m       Objective:   Physical Exam  Constitutional: She is oriented to person, place, and time. She appears well-developed and well-nourished.  HENT:  Head: Normocephalic and atraumatic.  Right Ear: Ear canal normal. Tympanic membrane is erythematous.  Left Ear: Tympanic membrane and ear canal normal.  Cardiovascular: Normal rate, regular rhythm and normal heart sounds.   No murmur heard. Pulmonary/Chest: Effort normal and breath sounds normal. No respiratory distress. She has no wheezes.  Neurological: She is alert and oriented to person, place, and time.  Psychiatric: She has a normal mood and affect. Her behavior is normal. Judgment and thought content normal.          Assessment & Plan:  HTN- bp is elevated. Advised pt to follow back up in 2 weeks for nurse visit recheck when feeling better. Likely secondary to recent sudafed use.   Right Otitis media- Rx with augmentin. (see phone note) She may also have an early sinusitis and this will cover her for sinus as well.    Cough- CXR is negative. Rx provided for cheratussin at bedtime since she is not sleeping well due to cough.

## 2016-02-06 NOTE — Progress Notes (Signed)
Pre visit review using our clinic review tool, if applicable. No additional management support is needed unless otherwise documented below in the visit note. 

## 2016-02-06 NOTE — Patient Instructions (Signed)
Please complete xray on the first floor. For cough, you may use cheratussin at night. During the day you may use delsym prn.  You may use tylenol/motrin a needed for muscle pain, fever. Call if new/worsening symptoms or if symptoms are not improved in 3 days.

## 2016-02-06 NOTE — Telephone Encounter (Signed)
cxr negative for pneumonia. I would like her to begin augmentin for her ear infection. Patient notified. Verbalizes understanding.

## 2016-02-10 ENCOUNTER — Ambulatory Visit: Payer: BLUE CROSS/BLUE SHIELD | Admitting: Family Medicine

## 2016-02-13 ENCOUNTER — Ambulatory Visit: Payer: BLUE CROSS/BLUE SHIELD | Admitting: Family

## 2016-02-13 ENCOUNTER — Encounter: Payer: Self-pay | Admitting: Family Medicine

## 2016-02-13 ENCOUNTER — Ambulatory Visit (INDEPENDENT_AMBULATORY_CARE_PROVIDER_SITE_OTHER): Payer: BLUE CROSS/BLUE SHIELD | Admitting: Family Medicine

## 2016-02-13 VITALS — BP 130/80 | HR 75 | Temp 98.2°F | Ht 61.0 in | Wt 199.0 lb

## 2016-02-13 DIAGNOSIS — B9689 Other specified bacterial agents as the cause of diseases classified elsewhere: Secondary | ICD-10-CM

## 2016-02-13 DIAGNOSIS — J208 Acute bronchitis due to other specified organisms: Secondary | ICD-10-CM | POA: Diagnosis not present

## 2016-02-13 DIAGNOSIS — H6690 Otitis media, unspecified, unspecified ear: Secondary | ICD-10-CM

## 2016-02-13 MED ORDER — BENZONATATE 100 MG PO CAPS: 100.0000 mg | ORAL_CAPSULE | Freq: Three times a day (TID) | ORAL | 0 refills | Status: DC | PRN

## 2016-02-13 MED ORDER — AZITHROMYCIN 250 MG PO TABS: ORAL_TABLET | ORAL | 0 refills | Status: DC

## 2016-02-13 NOTE — Patient Instructions (Signed)
Continue to push fluids, practice good hand hygiene, and cover your mouth if you cough.  If you start having high fevers, shaking, or shortness of breath, seek immediate care.

## 2016-02-13 NOTE — Progress Notes (Signed)
Pre visit review using our clinic review tool, if applicable. No additional management support is needed unless otherwise documented below in the visit note. 

## 2016-02-13 NOTE — Progress Notes (Signed)
Chief Complaint  Patient presents with  . Follow-up    cough(product-yellowish)-pt stated that she had to stop taking the Augmentin last Wed night (the last dose) it caused diarrhea    Amber [name] here for URI complaints.  Duration: 11 days  Associated symptoms: cough, R ear pain (much improved), sinus congestion, rhinorrhea Denies: sinus pain, itchy watery eyes, ear drainage, sore throat, shortness of breath and myalgia Treatment to date: Augmentin, Cheratussin, Delsum, Vit C energy drink, Tylenol Sick contacts: No  ROS:  Const: + fevers last week HEENT: As noted in HPI Lungs: No SOB  Past Medical History:  Diagnosis Date  . Anemia   . Arthritis   . Blood transfusion without reported diagnosis   . Colon polyps   . Ectopic pregnancy    RIGHT salpingostomy  . Elective abortion   . GERD (gastroesophageal reflux disease)   . Glaucoma    both eyes  . Headache    migraines  . History of chicken pox   . Hypertension   . Rheumatoid arthritis(714.0) 11/07/2011  . SAB (spontaneous abortion)   . Sjogren's disease ([place])    Family History  Problem Relation Age of Onset  . Hypertension Mother     died from heart disease  . Heart disease Mother     deceased, unknown cause  . Diabetes Sister   . Hypertension Sister   . Esophageal cancer Sister   . Stomach cancer Sister   . Cancer Sister   . Diabetes Sister   . Rectal cancer Neg Hx   . Colon cancer Neg Hx     BP 130/80 (BP Location: Left Arm, Patient Position: Sitting, Cuff Size: Large)   Pulse 75   Temp 98.2 F (36.8 C) (Oral)   Ht 5\' 1"  (1.549 m)   Wt 199 lb (90.3 kg)   LMP 01/02/2016 (Approximate)   SpO2 98%   BMI 37.60 kg/m  General: Awake, alert, appears stated age HEENT: AT, [place], ears patent b/l and TM neg on L, TM with some fluid but no erythema on R, nares patent w/o discharge, pharynx pink and without exudates, no sinus tenderness, MMM Neck: No masses or asymmetry Heart: RRR, no murmurs, no  bruits Lungs: CTAB, no accessory muscle use Psych: Age appropriate judgment and insight, normal mood and affect  Acute bacterial bronchitis - Plan: azithromycin (ZITHROMAX) 250 MG tablet, benzonatate (TESSALON) 100 MG capsule  Acute otitis media, unspecified otitis media type  Orders as above.  Zpak to cover for bact bronchitis and OM with failure of augmentin 2/2 AE's. Continue to push fluids, practice good hand hygiene, cover mouth when coughing. Letter for work given. F/u prn. Seek immediate care if she starts having SOB, fevers, and/or shaking. Pt voiced understanding and agreement to the plan.  [place], DO 02/13/16 3:18 PM

## 2016-02-14 ENCOUNTER — Encounter: Payer: Self-pay | Admitting: Family

## 2016-02-15 ENCOUNTER — Telehealth: Payer: Self-pay | Admitting: Family

## 2016-02-15 NOTE — Telephone Encounter (Signed)
Caller name: Verdene Lennert  Relation to pt: [employer]  Call back number: [phone] claim # [id number]   Reason for call:  [employer] faxing short term disability form and would like to speak with  Nurse.

## 2016-02-17 NOTE — Telephone Encounter (Signed)
[employer] is calling to verify appointments regarding patient's back. Please advise  Phone: [phone] ext. [phone]

## 2016-02-17 NOTE — Telephone Encounter (Signed)
Angie or Ashlee-- have either of you seen disability form mentioned below?

## 2016-02-20 NOTE — Telephone Encounter (Addendum)
Received Disability Claims paperwork from [place].  Called and spoke with [facility] with [employer] and informed her that we had received the paperwork.  She stated that they needed additional information to complete the claim review.  Provided Misty with as much of the additional information she needed which was also what was requested on the paperwork.//AB/CMA

## 2016-02-21 ENCOUNTER — Ambulatory Visit: Payer: BLUE CROSS/BLUE SHIELD | Admitting: Family

## 2016-03-09 ENCOUNTER — Ambulatory Visit: Payer: BLUE CROSS/BLUE SHIELD | Admitting: Oncology

## 2016-03-09 ENCOUNTER — Other Ambulatory Visit: Payer: BLUE CROSS/BLUE SHIELD

## 2016-03-13 ENCOUNTER — Ambulatory Visit (HOSPITAL_BASED_OUTPATIENT_CLINIC_OR_DEPARTMENT_OTHER): Payer: BLUE CROSS/BLUE SHIELD | Admitting: Oncology

## 2016-03-13 ENCOUNTER — Other Ambulatory Visit (HOSPITAL_BASED_OUTPATIENT_CLINIC_OR_DEPARTMENT_OTHER): Payer: BLUE CROSS/BLUE SHIELD

## 2016-03-13 ENCOUNTER — Telehealth: Payer: Self-pay | Admitting: Oncology

## 2016-03-13 VITALS — BP 149/75 | HR 72 | Temp 98.3°F | Resp 18 | Wt 200.3 lb

## 2016-03-13 DIAGNOSIS — D72819 Decreased white blood cell count, unspecified: Secondary | ICD-10-CM | POA: Diagnosis not present

## 2016-03-13 DIAGNOSIS — N921 Excessive and frequent menstruation with irregular cycle: Secondary | ICD-10-CM

## 2016-03-13 DIAGNOSIS — D509 Iron deficiency anemia, unspecified: Secondary | ICD-10-CM

## 2016-03-13 DIAGNOSIS — D500 Iron deficiency anemia secondary to blood loss (chronic): Secondary | ICD-10-CM

## 2016-03-13 LAB — CBC WITH DIFFERENTIAL/PLATELET
BASO%: 0.9 % (ref 0.0–2.0)
Basophils Absolute: 0 10*3/uL (ref 0.0–0.1)
EOS ABS: 0.1 10*3/uL (ref 0.0–0.5)
EOS%: 1.9 % (ref 0.0–7.0)
HCT: 40.8 % (ref 34.8–46.6)
HEMOGLOBIN: 13.3 g/dL (ref 11.6–15.9)
LYMPH%: 43.8 % (ref 14.0–49.7)
MCH: 28 pg (ref 25.1–34.0)
MCHC: 32.6 g/dL (ref 31.5–36.0)
MCV: 85.6 fL (ref 79.5–101.0)
MONO#: 0.3 10*3/uL (ref 0.1–0.9)
MONO%: 7.3 % (ref 0.0–14.0)
NEUT%: 46.1 % (ref 38.4–76.8)
NEUTROS ABS: 1.7 10*3/uL (ref 1.5–6.5)
Platelets: 234 10*3/uL (ref 145–400)
RBC: 4.76 10*6/uL (ref 3.70–5.45)
RDW: 13.8 % (ref 11.2–14.5)
WBC: 3.7 10*3/uL — AB (ref 3.9–10.3)
lymph#: 1.6 10*3/uL (ref 0.9–3.3)

## 2016-03-13 LAB — IRON AND TIBC
%SAT: 26 % (ref 21–57)
IRON: 72 ug/dL (ref 41–142)
TIBC: 278 ug/dL (ref 236–444)
UIBC: 205 ug/dL (ref 120–384)

## 2016-03-13 LAB — FERRITIN: FERRITIN: 65 ng/mL (ref 9–269)

## 2016-03-13 NOTE — Telephone Encounter (Signed)
Appointments scheduled per 03/13/16 los. Patient was given a copy of the AVS report and appointment schedule per 03/13/16 los. °

## 2016-03-13 NOTE — Progress Notes (Signed)
Hematology and Oncology Follow Up Visit  Amber [name] [id number] 02-May-1960 56 y.o. 03/13/2016 12:17 PM   Principle Diagnosis: 56 year old woman with iron deficiency anemia due to menorrhagia and uterine fibroids. 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  Prior Therapy:  She is status post IV iron infusion in the form of Feraheme given on 05/05/2012, 10/2012 and 09/2013.  The most recent of which done in June 2016.  Current therapy: Iron sulfate 325 mg by mouth 3 times a day.  Interim History: Ms. Amber [name] presents today for a followup visit. Since the last visit, she reports feeling reasonably well. She continues to have chronic fatigue which is not dramatically changed from previous examination. She has been involved in the care of her terminally ill sister that have recently passed away. She attributes a lot of stress and fatigue related to that. She continues to have periodic menstrual cycles despite her uterine fibroids been treated in the past. She denied any hematochezia or melena. She denied any other blood losses.  She reports she has been tolerating oral iron supplements without any major complications. She's been taking iron total of 3 times a day without major constipation or dyspepsia.   She does not report any headaches, blurry vision, syncope or seizures. She has not reported any chest pain or shortness of breath. She does not report any constitutional symptoms of fevers chills or sweats. She does not report any frequency urgency or hesitancy. She does not report any lymphadenopathy or petechiae. Remainder or view of system is unremarkable.  Medications: I have reviewed the patient's current medications.  Current Outpatient Prescriptions  Medication Sig Dispense Refill  . Cyanocobalamin (VITAMIN B-12) 5000 MCG LOZG Take 1 tablet by mouth 2 (two) times daily.    . ferrous sulfate 325 (65 FE) MG tablet Take 325 mg by mouth 3 (three)  times daily with meals.     . furosemide (LASIX) 20 MG tablet Take 1 tablet (20 mg total) by mouth daily as needed for fluid. 90 tablet 1  . hydroxychloroquine (PLAQUENIL) 200 MG tablet Take 200 mg by mouth 2 (two) times daily.     Marland Kitchen lisinopril (PRINIVIL,ZESTRIL) 20 MG tablet Take 1 tablet (20 mg total) by mouth daily. 90 tablet 1  . methocarbamol (ROBAXIN) 500 MG tablet Take 1 tablet (500 mg total) by mouth every 8 (eight) hours as needed. 60 tablet 1  . metoprolol succinate (TOPROL-XL) 50 MG 24 hr tablet Take 1 tablet (50 mg total) by mouth daily. Take with or immediately following a meal. 90 tablet 1  . potassium chloride (K-DUR) 10 MEQ tablet Take 2 tablets (20 mEq total) by mouth daily. On the days you take lasix 90 tablet 1  . SUMAtriptan (IMITREX) 50 MG tablet Take 1 tablet (50 mg total) by mouth every 2 (two) hours as needed for migraine. May repeat in 2 hours if headache persists or recurs. 10 tablet 0  . TRAVATAN Z 0.004 % SOLN ophthalmic solution Place 1 drop into both eyes at bedtime.     . benzonatate (TESSALON) 100 MG capsule Take 1 capsule (100 mg total) by mouth 3 (three) times daily as needed for cough. (Patient not taking: Reported on 03/13/2016) 30 capsule 0   No current facility-administered medications for this visit.      Allergies: No Known Allergies    Physical Exam: Blood pressure (!) 149/75, pulse 72, temperature 98.3 F (36.8 C), temperature source Oral, resp. rate 18, weight  200 lb 4.8 oz (90.9 kg), SpO2 100 %. ECOG: 0 General appearance: Alert, awake woman without distress. Head: Normocephalic. No oral ulcers or thrush. Neck: no adenopathy or masses. No thyromegaly. Lymph nodes: Cervical, supraclavicular, and axillary nodes normal. Heart:regular rate and rhythm, S1, S2 normal, no murmur, click, rub or gallop Lung:chest clear, no wheezing, rales, normal symmetric air entry no dullness to percussion. Abdomen: soft without masses or organomegaly no dullness or  ascites. No shifting dullness or ascites. EXT:no erythema, or edema.   Lab Results: Lab Results  Component Value Date   WBC 3.7 (L) 03/13/2016   HGB 13.3 03/13/2016   HCT 40.8 03/13/2016   MCV 85.6 03/13/2016   PLT 234 03/13/2016     Chemistry      Component Value Date/Time   NA 140 08/08/2015 1654   NA 138 01/21/2015 0935   K 4.3 08/08/2015 1654   K 3.9 01/21/2015 0935   CL 107 08/08/2015 1654   CL 108 (H) 06/25/2012 1508   CO2 28 08/08/2015 1654   CO2 25 01/21/2015 0935   BUN 11 08/08/2015 1654   BUN 7.0 01/21/2015 0935   CREATININE 0.67 08/08/2015 1654   CREATININE 0.8 01/21/2015 0935      Component Value Date/Time   CALCIUM 9.6 08/08/2015 1654   CALCIUM 9.4 01/21/2015 0935   ALKPHOS 48 08/08/2015 1654   ALKPHOS 54 01/21/2015 0935   AST 17 08/08/2015 1654   AST 17 01/21/2015 0935   ALT 15 08/08/2015 1654   ALT 15 01/21/2015 0935   BILITOT 0.4 08/08/2015 1654   BILITOT 0.55 01/21/2015 0935       Impression and Plan:  56 year old woman with the following issues:  1. Iron deficiency anemia due to menorrhagia: She is status post IV iron on June 2016 and currently on oral iron.   Hemoglobin Has normalized and iron studies are currently pending. I have recommended on iron supplementation to continue and she can reduce the dose to twice a day.  2. Leukocytopenia: Bone marrow biopsy done on 09/09/2013 did not show any evidence of dysplasia or infiltrative bone marrow process. There is no evidence of any lymphoproliferative disease.   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 No major changes noted since her laboratory testing from previous visits.  3. Rheumatoid arthritis: She currently on Plaquenil and seems to be adequately controlling her disease.  4. Followup will be in 6 months to recheck your iron stores.   VNRWCH,JSCBI 3/13/201812:17 PM

## 2016-03-15 ENCOUNTER — Encounter: Payer: Self-pay | Admitting: Family Medicine

## 2016-03-15 ENCOUNTER — Ambulatory Visit (INDEPENDENT_AMBULATORY_CARE_PROVIDER_SITE_OTHER): Payer: BLUE CROSS/BLUE SHIELD | Admitting: Family Medicine

## 2016-03-15 VITALS — BP 140/84 | HR 76 | Temp 98.3°F | Ht 61.0 in | Wt 201.4 lb

## 2016-03-15 DIAGNOSIS — I10 Essential (primary) hypertension: Secondary | ICD-10-CM | POA: Diagnosis not present

## 2016-03-15 MED ORDER — METOPROLOL SUCCINATE ER 100 MG PO TB24: 100.0000 mg | ORAL_TABLET | Freq: Every day | ORAL | 2 refills | Status: DC

## 2016-03-15 NOTE — Progress Notes (Signed)
Chief Complaint  Patient presents with  . Hypertension    pt states she has had a h/a since [place] is a 56 y.o. female who presents for hypertension follow up. She does monitor home blood pressures. Blood pressures ranging from 150-160's/80-90's on average. She is compliant with medications- Lasix 20 mg daily, Lisinopril 20 mg daily, Metoprolol 50 mg XL daily. Patient has these side effects of medication: none She is adhering to a healthy diet overall. She has been having a HA, does have hx of migraines, but states this feels different. She does have a hx of headaches when her BP is in the 150-160's, and states this is similar.  Current exercise: Nothing    Past Medical History:  Diagnosis Date  . Anemia   . Arthritis   . Blood transfusion without reported diagnosis   . Colon polyps   . Ectopic pregnancy    RIGHT salpingostomy  . Elective abortion   . GERD (gastroesophageal reflux disease)   . Glaucoma    both eyes  . Headache    migraines  . History of chicken pox   . Hypertension   . Rheumatoid arthritis(714.0) 11/07/2011  . SAB (spontaneous abortion)   . Sjogren's disease ([place])    Family History  Problem Relation Age of Onset  . Hypertension Mother     died from heart disease  . Heart disease Mother     deceased, unknown cause  . Diabetes Sister   . Hypertension Sister   . Esophageal cancer Sister   . Stomach cancer Sister   . Cancer Sister   . Diabetes Sister   . Rectal cancer Neg Hx   . Colon cancer Neg Hx      Medications Current Outpatient Prescriptions on File Prior to Visit  Medication Sig Dispense Refill  . benzonatate (TESSALON) 100 MG capsule Take 1 capsule (100 mg total) by mouth 3 (three) times daily as needed for cough. 30 capsule 0  . Cyanocobalamin (VITAMIN B-12) 5000 MCG LOZG Take 1 tablet by mouth 2 (two) times daily.    . ferrous sulfate 325 (65 FE) MG tablet Take 325 mg by mouth 3 (three) times daily with  meals.     . furosemide (LASIX) 20 MG tablet Take 1 tablet (20 mg total) by mouth daily as needed for fluid. 90 tablet 1  . hydroxychloroquine (PLAQUENIL) 200 MG tablet Take 200 mg by mouth 2 (two) times daily.     Marland Kitchen lisinopril (PRINIVIL,ZESTRIL) 20 MG tablet Take 1 tablet (20 mg total) by mouth daily. 90 tablet 1  . methocarbamol (ROBAXIN) 500 MG tablet Take 1 tablet (500 mg total) by mouth every 8 (eight) hours as needed. 60 tablet 1  . metoprolol succinate (TOPROL-XL) 50 MG 24 hr tablet Take 1 tablet (50 mg total) by mouth daily. Take with or immediately following a meal. 90 tablet 1  . potassium chloride (K-DUR) 10 MEQ tablet Take 2 tablets (20 mEq total) by mouth daily. On the days you take lasix 90 tablet 1  . SUMAtriptan (IMITREX) 50 MG tablet Take 1 tablet (50 mg total) by mouth every 2 (two) hours as needed for migraine. May repeat in 2 hours if headache persists or recurs. 10 tablet 0  . TRAVATAN Z 0.004 % SOLN ophthalmic solution Place 1 drop into both eyes at bedtime.      Allergies No Known Allergies  Review of Systems Cardiovascular: no chest pain Respiratory:  no shortness  of breath  Exam BP 140/84 (BP Location: Left Arm, Patient Position: Sitting, Cuff Size: Large)   Pulse 76   Temp 98.3 F (36.8 C) (Oral)   Ht 5\' 1"  (1.549 m)   Wt 201 lb 6.4 oz (91.4 kg)   LMP 01/02/2016 (Approximate)   SpO2 99%   BMI 38.05 kg/m  General:  well developed, well nourished, in no apparent distress Skin:  warm, no pallor or diaphoresis Eyes:  pupils equal and round, sclera anicteric without injection, I do not appreciate any papilledema.  Heart :RRR, no murmurs, no bruits, no LE edema Lungs:  clear to auscultation, no accessory muscle use Psych: well oriented with normal range of affect and appropriate judgment/insight  Essential hypertension - Plan: metoprolol succinate (TOPROL-XL) 100 MG 24 hr tablet  Status: uncontrolled Increase dose of Metoprolol from 50 mg daily to 100 mg  daily. Offered to do nothing/lifestyle changes only or adding a new medicine as well. Stay on Lisinopril at current dose. Counseled on diet and exercise, encouraged to keep home BP log, detailed in AVS. F/u in 2 weeks for nurse visit. The patient voiced understanding and agreement to the plan.  [place], DO 03/15/16  8:48 AM

## 2016-03-15 NOTE — Patient Instructions (Signed)
Take 1 of the Metoprolol daily until you run out. A new strength awaits you.  Aim to do some physical exertion for 150 minutes per week. This is typically divided into 5 days per week, 30 minutes per day. The activity should be enough to get your heart rate up. Anything is better than nothing if you have time constraints.  Around 3 times per week, check your blood pressure 4 times per day. Twice in the morning and twice in the evening. The readings should be at least one minute apart. Write down these values and bring them to your next nurse visit/appointment.  When you check your BP, make sure you have been doing something calm/relaxing 5 minutes prior to checking. Both feet should be flat on the floor and you should be sitting. Use your left arm and make sure it is in a relaxed position (on a table), and that the cuff is at the approximate level/height of your heart.

## 2016-03-15 NOTE — Progress Notes (Signed)
Pre visit review using our clinic review tool, if applicable. No additional management support is needed unless otherwise documented below in the visit note. 

## 2016-03-29 ENCOUNTER — Ambulatory Visit (INDEPENDENT_AMBULATORY_CARE_PROVIDER_SITE_OTHER): Payer: PRIVATE HEALTH INSURANCE | Admitting: Medical

## 2016-03-29 VITALS — BP 160/99

## 2016-03-29 DIAGNOSIS — I10 Essential (primary) hypertension: Secondary | ICD-10-CM | POA: Diagnosis not present

## 2016-03-29 NOTE — Progress Notes (Signed)
Pre visit review using our clinic tool,if applicable. No additional management support is needed unless otherwise documented below in the visit note.   Patient in for P check per order from [name] dated 03/15/16.  Patient did not take BP medications prior to appointment. Did not bring BP home machine in with her as requested. States she forgot. She did record BP's at home. Copies made for E.Saguier, PA-C and Elvera Maria, NP.   Today BP= 160/99 P=72  Per E. Saguier, PA-C patient to return for BP check in 1 week. Reminded patient  Of the inmprtance of taking BP medications daily and monitor Sodium intake. Advised to take BP medication proir to coming. Patient agreed.  I had also advised Santiago Glad to advise pt to get bp cuff otc. Take meds daily. Document bp readings daily and bring machine/or readings in on day of office visit.  Saguier, Percell Miller, PA-C

## 2016-04-10 ENCOUNTER — Telehealth: Payer: Self-pay | Admitting: Family

## 2016-04-10 NOTE — Telephone Encounter (Signed)
See my chart message

## 2016-04-23 ENCOUNTER — Encounter: Payer: PRIVATE HEALTH INSURANCE | Admitting: Family

## 2016-04-27 ENCOUNTER — Encounter: Payer: PRIVATE HEALTH INSURANCE | Admitting: Family

## 2016-05-14 ENCOUNTER — Ambulatory Visit (INDEPENDENT_AMBULATORY_CARE_PROVIDER_SITE_OTHER): Payer: PRIVATE HEALTH INSURANCE | Admitting: Family

## 2016-05-14 ENCOUNTER — Encounter: Payer: Self-pay | Admitting: Family

## 2016-05-14 VITALS — BP 157/83 | HR 66 | Temp 97.8°F | Resp 16 | Ht 60.5 in | Wt 206.6 lb

## 2016-05-14 DIAGNOSIS — R002 Palpitations: Secondary | ICD-10-CM

## 2016-05-14 DIAGNOSIS — I10 Essential (primary) hypertension: Secondary | ICD-10-CM

## 2016-05-14 DIAGNOSIS — Z0000 Encounter for general adult medical examination without abnormal findings: Secondary | ICD-10-CM

## 2016-05-14 DIAGNOSIS — Z0001 Encounter for general adult medical examination with abnormal findings: Secondary | ICD-10-CM

## 2016-05-14 LAB — URINALYSIS, ROUTINE W REFLEX MICROSCOPIC
Bilirubin Urine: NEGATIVE
HGB URINE DIPSTICK: NEGATIVE
Ketones, ur: NEGATIVE
Leukocytes, UA: NEGATIVE
NITRITE: NEGATIVE
RBC / HPF: NONE SEEN (ref 0–?)
Specific Gravity, Urine: 1.02 (ref 1.000–1.030)
Total Protein, Urine: NEGATIVE
Urine Glucose: NEGATIVE
Urobilinogen, UA: 0.2 (ref 0.0–1.0)
pH: 7 (ref 5.0–8.0)

## 2016-05-14 LAB — HEPATIC FUNCTION PANEL
ALT: 26 U/L (ref 0–35)
AST: 20 U/L (ref 0–37)
Albumin: 3.9 g/dL (ref 3.5–5.2)
Alkaline Phosphatase: 41 U/L (ref 39–117)
BILIRUBIN DIRECT: 0.2 mg/dL (ref 0.0–0.3)
BILIRUBIN TOTAL: 0.7 mg/dL (ref 0.2–1.2)
Total Protein: 9.1 g/dL — ABNORMAL HIGH (ref 6.0–8.3)

## 2016-05-14 LAB — CBC WITH DIFFERENTIAL/PLATELET
Basophils Absolute: 0 10*3/uL (ref 0.0–0.1)
Basophils Relative: 1 % (ref 0.0–3.0)
EOS ABS: 0.1 10*3/uL (ref 0.0–0.7)
EOS PCT: 4.5 % (ref 0.0–5.0)
HCT: 39 % (ref 36.0–46.0)
Hemoglobin: 12.9 g/dL (ref 12.0–15.0)
LYMPHS ABS: 1.4 10*3/uL (ref 0.7–4.0)
Lymphocytes Relative: 53.1 % — ABNORMAL HIGH (ref 12.0–46.0)
MCHC: 33.2 g/dL (ref 30.0–36.0)
MCV: 85.9 fl (ref 78.0–100.0)
MONO ABS: 0.2 10*3/uL (ref 0.1–1.0)
Monocytes Relative: 6.1 % (ref 3.0–12.0)
NEUTROS PCT: 35.3 % — AB (ref 43.0–77.0)
Neutro Abs: 0.9 10*3/uL — ABNORMAL LOW (ref 1.4–7.7)
Platelets: 209 10*3/uL (ref 150.0–400.0)
RBC: 4.53 Mil/uL (ref 3.87–5.11)
RDW: 14.8 % (ref 11.5–15.5)
WBC: 2.6 10*3/uL — AB (ref 4.0–10.5)

## 2016-05-14 LAB — BASIC METABOLIC PANEL
BUN: 11 mg/dL (ref 6–23)
CALCIUM: 9.8 mg/dL (ref 8.4–10.5)
CHLORIDE: 102 meq/L (ref 96–112)
CO2: 28 mEq/L (ref 19–32)
CREATININE: 0.58 mg/dL (ref 0.40–1.20)
GFR: 138.87 mL/min (ref >60.00)
Glucose, Bld: 72 mg/dL (ref 70–99)
Potassium: 3.8 mEq/L (ref 3.5–5.1)
SODIUM: 134 meq/L — AB (ref 135–145)

## 2016-05-14 LAB — LIPID PANEL
CHOL/HDL RATIO: 3
CHOLESTEROL: 142 mg/dL (ref 0–200)
HDL: 55.4 mg/dL (ref >39.00)
LDL CALC: 75 mg/dL (ref 0–99)
NonHDL: 86.34
TRIGLYCERIDES: 56 mg/dL (ref 0.0–149.0)
VLDL: 11.2 mg/dL (ref 0.0–40.0)

## 2016-05-14 LAB — TSH: TSH: 0.77 u[IU]/mL (ref 0.35–4.50)

## 2016-05-14 MED ORDER — LISINOPRIL 40 MG PO TABS: 40.0000 mg | ORAL_TABLET | Freq: Every day | ORAL | 3 refills | Status: DC

## 2016-05-14 NOTE — Progress Notes (Signed)
Subjective:    Patient ID: Amber [name], female    DOB: [birth year], 56 y.o.   MRN: [id number]  HPI   Patient presents today for complete physical. She reports that her sister who had been ill passed away.   Immunizations: up to date Diet: reports poor diet.  Needs to cut back on processed and fast food.  Exercise: not exercising.  Colonoscopy: age 32, due for follow up in December with Dr. Collene Mares Still having periods.  Pap Smear: 1/16- due 1/19  Mammogram: due  Hypertension- reports that her BP has been up. Metoprolol was increased to 100mg . She is also maintained on lisinopril 20mg . Uses lasix about 2x a week.   BP Readings from Last 3 Encounters:  05/14/16 (!) 157/83  03/29/16 (!) 160/99  03/15/16 140/84   Wt Readings from Last 3 Encounters:  05/14/16 206 lb 9.6 oz (93.7 kg)  03/15/16 201 lb 6.4 oz (91.4 kg)  03/13/16 200 lb 4.8 oz (90.9 kg)     Review of Systems  Constitutional: Negative for unexpected weight change.  HENT: Positive for sneezing. Negative for rhinorrhea.   Eyes: Negative for visual disturbance.  Respiratory: Negative for cough.   Cardiovascular:       Notes chronic mild LE edema, notes chronic mild intermittent palpitations. Have been better recently.   Gastrointestinal: Negative for constipation and diarrhea.  Genitourinary: Negative for dysuria and frequency.  Musculoskeletal: Negative for myalgias.       Some arthritis pain in her hands  Skin: Negative for rash.  Neurological:       Occasional HA's.   Hematological: Negative for adenopathy.  Psychiatric/Behavioral:       Had some depression after her sister passed in December, feeling better now.    Past Medical History:  Diagnosis Date  . Anemia   . Arthritis   . Blood transfusion without reported diagnosis   . Colon polyps   . Ectopic pregnancy    RIGHT salpingostomy  . Elective abortion   . GERD (gastroesophageal reflux disease)   . Glaucoma    both eyes  . Headache    migraines  . History of chicken pox   . Hypertension   . Rheumatoid arthritis(714.0) 11/07/2011  . SAB (spontaneous abortion)   . Sjogren's disease [facility])      Social History   Social History  . Marital status: Single    Spouse name: N/A  . Number of children: 0  . 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 Smoking status: Never Smoker  . Smokeless tobacco: Never Used  . Alcohol use No  . Drug use: No  . Sexual activity: Yes    Birth control/ protection: None     Comment: 1st intercourse- 58, partners- 5   Other Topics Concern  . Not on file   Social History Narrative   Regular exercise:  No   Caffeine Use: 2 cups coffee daily   No children   "Happily divorced"   Reports that she is involved at her church   Works with intellectually challenged adults   Born in [place]- she came to [place] as adult.  Age 38.              Past Surgical History:  Procedure Laterality Date  . ABDOMINAL SURGERY     bowel repair  . COLONOSCOPY    . DILATION AND CURETTAGE OF UTERUS  X 2  . MENISCUS REPAIR Right    MENISCUS TEAR REPAIR  . MYOMECTOMY N/A 07/27/2014   Procedure: ABDOMINAL MYOMECTOMY;  Surgeon: Waymon Amato, MD;  Location: [facility];  Service: Gynecology;  Laterality: N/A;  . PELVIC LAPAROSCOPY  1994   IN 1999 LAPAROSCOPY WITH INCIDENTAL ENTEROTOMY REQUIRING LAPAROTOMY  . UTERINE FIBROID SURGERY     July 2016    Family History  Problem Relation Age of Onset  . Hypertension Mother        died from heart disease  . Heart disease Mother        deceased, unknown cause  . Diabetes Sister   . Hypertension Sister   . Esophageal cancer Sister   . Stomach cancer Sister   . Cancer Sister   . Diabetes Sister   . Rectal cancer Neg Hx   . Colon cancer Neg Hx     No Known Allergies  Current Outpatient Prescriptions on File Prior to Visit  Medication Sig Dispense Refill  . Cyanocobalamin (VITAMIN B-12) 5000 MCG LOZG Take  1 tablet by mouth 2 (two) times daily.    . ferrous sulfate 325 (65 FE) MG tablet Take 325 mg by mouth 3 (three) times daily with meals.     . furosemide (LASIX) 20 MG tablet Take 1 tablet (20 mg total) by mouth daily as needed for fluid. 90 tablet 1  . hydroxychloroquine (PLAQUENIL) 200 MG tablet Take 200 mg by mouth 2 (two) times daily.     . methocarbamol (ROBAXIN) 500 MG tablet Take 1 tablet (500 mg total) by mouth every 8 (eight) hours as needed. 60 tablet 1  . metoprolol succinate (TOPROL-XL) 100 MG 24 hr tablet Take 1 tablet (100 mg total) by mouth daily. Take with or immediately following a meal. 30 tablet 2  . potassium chloride (K-DUR) 10 MEQ tablet Take 2 tablets (20 mEq total) by mouth daily. On the days you take lasix 90 tablet 1  . SUMAtriptan (IMITREX) 50 MG tablet Take 1 tablet (50 mg total) by mouth every 2 (two) hours as needed for migraine. May repeat in 2 hours if headache persists or recurs. 10 tablet 0  . TRAVATAN Z 0.004 % SOLN ophthalmic solution Place 1 drop into both eyes at bedtime.     . benzonatate (TESSALON) 100 MG capsule Take 1 capsule (100 mg total) by mouth 3 (three) times daily as needed for cough. (Patient not taking: Reported on 03/29/2016) 30 capsule 0   No current facility-administered medications on file prior to visit.     BP (!) 157/83   Pulse 66   Temp 97.8 F (36.6 C) (Oral)   Resp 16   Ht 5' 0.5" (1.537 m)   Wt 206 lb 9.6 oz (93.7 kg)   LMP 05/01/2016 (Exact Date)   SpO2 100%   BMI 39.68 kg/m        Objective:   Physical Exam  Physical Exam  Constitutional: She is oriented to person, place, and time. She appears well-developed and well-nourished. No distress.  HENT:  Head: Normocephalic and atraumatic.  Right Ear: Tympanic membrane and ear canal normal.  Left Ear: Tympanic membrane and ear canal normal.  Mouth/Throat: Oropharynx is clear and moist.  Eyes: Pupils are equal, round, and reactive to light. No scleral icterus.  Neck:  Normal range of motion. No thyromegaly present.  Cardiovascular: Normal rate and regular rhythm.   No murmur heard. Pulmonary/Chest: Effort normal and breath sounds normal. No respiratory distress. He has  no wheezes. She has no rales. She exhibits no tenderness.  Abdominal: Soft. Bowel sounds are normal. She exhibits no distension and no mass. There is no tenderness. There is no rebound and no guarding.  Musculoskeletal: She exhibits no edema.  Lymphadenopathy:    She has no cervical adenopathy.  Neurological: She is alert and oriented to person, place, and time. She has normal patellar reflexes. She exhibits normal muscle tone. Coordination normal.  Skin: Skin is warm and dry.  Psychiatric: She has a normal mood and affect. Her behavior is normal. Judgment and thought content normal.  Breasts: Examined lying Right: Without masses, retractions, discharge or axillary adenopathy.  Left: Without masses, retractions, discharge or axillary adenopathy.  Pelvic: deferred.         Assessment & Plan:         Assessment & Plan:  Preventative Care- discussed healthy diet, exercise, weight loss.  She will check coverage for shingrix with her insurance and call for nurse visit if covered to receive immunization. EKG is personally reviewed and compared to prior EKG.  EKG appears unchanged, NSR without acute changes. Refer for mammogram. Advised pt to let me know if her palpitations become more frequent and we could consider holter monitor. Obtain routine lab work.

## 2016-05-14 NOTE — Patient Instructions (Addendum)
Follow up with Dr. Collene Mares in December for repeat colonoscopy.  Contact their office if you do not receive a recall letter. Schedule a pap smear in January 2019.   Increase lisinopril to 40mg  once daily.  Please complete lab work prior to leaving.

## 2016-05-16 ENCOUNTER — Encounter: Payer: Self-pay | Admitting: Gynecology

## 2016-05-17 NOTE — Assessment & Plan Note (Signed)
Uncontrolled. Increase lisinopril to 40mg .

## 2016-06-14 ENCOUNTER — Encounter: Payer: Self-pay | Admitting: Emergency Medicine

## 2016-06-14 ENCOUNTER — Ambulatory Visit (INDEPENDENT_AMBULATORY_CARE_PROVIDER_SITE_OTHER): Payer: PRIVATE HEALTH INSURANCE | Admitting: Emergency Medicine

## 2016-06-14 VITALS — BP 162/82 | HR 74 | Temp 99.1°F | Resp 16 | Ht 60.25 in | Wt 209.0 lb

## 2016-06-14 DIAGNOSIS — Z9229 Personal history of other drug therapy: Secondary | ICD-10-CM

## 2016-06-14 DIAGNOSIS — Z111 Encounter for screening for respiratory tuberculosis: Secondary | ICD-10-CM | POA: Diagnosis not present

## 2016-06-14 NOTE — Progress Notes (Signed)
Amber [name] 56 y.o.   Chief Complaint  Patient presents with  . LABWORK    TB SCREENING for preemployment    HISTORY OF PRESENT ILLNESS: This is a 56 y.o. female needs TB blood test for pre-employment purposes. Asymptomatic. No complaints.  HPI   Prior to Admission medications   Medication Sig Start Date End Date Taking? Authorizing Provider  Cyanocobalamin (VITAMIN B-12) 5000 MCG LOZG Take 1 tablet by mouth 2 (two) times daily. 11/04/15  Yes [provider]  ferrous sulfate 325 (65 FE) MG tablet Take 325 mg by mouth 3 (three) times daily with meals.    Yes [provider]  furosemide (LASIX) 20 MG tablet Take 1 tablet (20 mg total) by mouth daily as needed for fluid. 12/09/15  Yes [name], NP  hydroxychloroquine (PLAQUENIL) 200 MG tablet Take 200 mg by mouth 2 (two) times daily.    Yes [provider]  lisinopril (PRINIVIL,ZESTRIL) 40 MG tablet Take 1 tablet (40 mg total) by mouth daily. 05/14/16  Yes [name], NP  methocarbamol (ROBAXIN) 500 MG tablet Take 1 tablet (500 mg total) by mouth every 8 (eight) hours as needed. 01/17/16  Yes Hudnall, Sharyn Lull, MD  metoprolol succinate (TOPROL-XL) 100 MG 24 hr tablet Take 1 tablet (100 mg total) by mouth daily. Take with or immediately following a meal. 03/15/16  Yes Wendling, Crosby Oyster, DO  potassium chloride (K-DUR) 10 MEQ tablet Take 2 tablets (20 mEq total) by mouth daily. On the days you take lasix 12/09/15 12/20/16 Yes [name], NP  SUMAtriptan (IMITREX) 50 MG tablet Take 1 tablet (50 mg total) by mouth every 2 (two) hours as needed for migraine. May repeat in 2 hours if headache persists or recurs. 02/01/16  Yes [name], NP  TRAVATAN Z 0.004 % SOLN ophthalmic solution Place 1 drop into both eyes at bedtime.  10/25/11  Yes [provider]    No Known Allergies  Patient Active Problem List   Diagnosis Date Noted  . Screening-pulmonary TB 06/14/2016   . Fibroids 07/27/2014  . Migraine 04/19/2014  . Nail fungus 04/09/2014  . Elevated serum protein level 04/09/2014  . Low back pain 04/09/2014  . Preventative health care 04/09/2014  . Iron deficiency anemia 09/02/2013  . Headache(784.0) 03/04/2013  . Obesity (BMI 30-39.9) 10/17/2012  . Glaucoma 11/07/2011  . Rheumatoid arthritis ([place] 11/07/2011  . Borderline diabetes 11/07/2011  . HTN (hypertension) 11/05/2011  . Sjogren's disease ([place]) 11/05/2011  . Anemia 09/22/2010  . Fibroid uterus 09/22/2010  . Neutropenia 09/22/2010  . Menorrhagia 09/15/2010  . Dysmenorrhea 09/15/2010    Past Medical History:  Diagnosis Date  . Anemia   . Arthritis   . Blood transfusion without reported diagnosis   . Colon polyps   . Ectopic pregnancy    RIGHT salpingostomy  . Elective abortion   . GERD (gastroesophageal reflux disease)   . Glaucoma    both eyes  . Headache    migraines  . History of chicken pox   . Hypertension   . Rheumatoid arthritis(714.0) 11/07/2011  . SAB (spontaneous abortion)   . Sjogren's disease [facility])     Past Surgical History:  Procedure Laterality Date  . ABDOMINAL SURGERY     bowel repair  . COLONOSCOPY    . DILATION AND CURETTAGE OF UTERUS     X 2  . MENISCUS REPAIR Right    MENISCUS TEAR REPAIR  . MYOMECTOMY N/A 07/27/2014   Procedure: ABDOMINAL MYOMECTOMY;  Surgeon: Waymon Amato, MD;  Location: [facility];  Service: Gynecology;  Laterality: N/A;  . PELVIC LAPAROSCOPY  1994   IN 1999 LAPAROSCOPY WITH INCIDENTAL ENTEROTOMY REQUIRING LAPAROTOMY  . UTERINE FIBROID SURGERY     July 2016    Social History   Social History  . Marital status: Single    Spouse name: N/A  . Number of children: 0  . 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 Smoking status: Never Smoker  . Smokeless tobacco: Never Used  . Alcohol use No  . Drug use: No  . Sexual activity: Yes    Birth control/ protection: None      Comment: 1st intercourse- 18, partners- 5   Other Topics Concern  . 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  Had a sister up in [place] who passed away 30-Apr-2015 who she was close to.   Born in [place]- she came to [place] as adult.  Age 40.              Family History  Problem Relation Age of Onset  . Hypertension Mother        died from heart disease  . Heart disease Mother        deceased, unknown cause  . Diabetes Sister   . Hypertension Sister   . Esophageal cancer Sister   . Stomach cancer Sister   . Cancer Sister   . Diabetes Sister   . Rectal cancer Neg Hx   . Colon cancer Neg Hx      Review of Systems  Constitutional: Negative for chills, fever and weight loss.  HENT: Negative.   Eyes: Negative.   Respiratory: Negative for cough, hemoptysis, shortness of breath and wheezing.   Cardiovascular: Negative for chest pain.  Gastrointestinal: Negative for abdominal pain, diarrhea, nausea and vomiting.  Genitourinary: Negative for dysuria and hematuria.  Musculoskeletal: Negative for back pain, myalgias and neck pain.  Skin: Negative for rash.  Neurological: Negative for dizziness, sensory change, focal weakness and headaches.  Endo/Heme/Allergies: Negative.   All other systems reviewed and are negative.  Vitals:   06/14/16 1212 06/14/16 1229  BP: (!) 155/83 (!) 162/82  Pulse: 74   Resp: 16   Temp: 99.1 F (37.3 C)      Physical Exam  Constitutional: She is oriented to person, place, and time. She appears well-developed and well-nourished.  HENT:  Head: Normocephalic and atraumatic.  Eyes: EOM are normal. Pupils are equal, round, and reactive to light.  Neck: Normal range of motion. Neck supple.  Cardiovascular: Normal rate and regular rhythm.   Pulmonary/Chest: Effort normal  and breath sounds normal.  Musculoskeletal: Normal range of motion.  Neurological: She is alert and oriented to person, place, and time.  Skin: Skin is warm and dry. Capillary refill takes less than 2 seconds.  Psychiatric: She has a normal mood and affect. Her behavior is normal.  Vitals reviewed.    ASSESSMENT & PLAN: Neira was seen today for labwork.  Diagnoses and all orders for this visit:  Screening-pulmonary TB -     Quantiferon tb gold assay  History of BCG vaccination    Patient Instructions  IF you received an x-ray today, you will receive an invoice from [facility]. Please contact [facility] at [phone] with questions or concerns regarding your invoice.   IF you received labwork today, you will receive an invoice from [facility]. Please contact [facility] at [phone] with questions or concerns regarding your invoice.   Our billing staff will not be able to assist you with questions regarding bills from these companies.  You will be contacted with the lab results as soon as they are available. The fastest way to get your results is to activate your My Chart account. Instructions are located on the last page of this paperwork. If you have not heard from [place] regarding the results in 2 weeks, please contact this office.         Agustina Caroli, MD [facility]

## 2016-06-14 NOTE — Patient Instructions (Signed)
     IF you received an x-ray today, you will receive an invoice from [facility]. Please contact [facility] at [phone] with questions or concerns regarding your invoice.   IF you received labwork today, you will receive an invoice from [facility]. Please contact [facility] at [phone] with questions or concerns regarding your invoice.   Our billing staff will not be able to assist you with questions regarding bills from these companies.  You will be contacted with the lab results as soon as they are available. The fastest way to get your results is to activate your My Chart account. Instructions are located on the last page of this paperwork. If you have not heard from us regarding the results in 2 weeks, please contact this office.     

## 2016-06-18 LAB — QUANTIFERON TB GOLD ASSAY (BLOOD)

## 2016-06-18 LAB — QUANTIFERON IN TUBE
QFT TB AG MINUS NIL VALUE: <0 [IU]/mL
QUANTIFERON MITOGEN VALUE: 7.06 [IU]/mL
QUANTIFERON TB AG VALUE: 0.05 [IU]/mL
QUANTIFERON TB GOLD: NEGATIVE
Quantiferon Nil Value: 0.07 [IU]/mL

## 2016-06-20 ENCOUNTER — Encounter: Payer: Self-pay | Admitting: *Deleted

## 2016-08-01 ENCOUNTER — Other Ambulatory Visit: Payer: Self-pay | Admitting: Family

## 2016-09-04 ENCOUNTER — Telehealth: Payer: Self-pay | Admitting: Family

## 2016-09-04 DIAGNOSIS — E278 Other specified disorders of adrenal gland: Secondary | ICD-10-CM

## 2016-09-04 NOTE — Telephone Encounter (Signed)
Please contact pt and let her know that she is due for a follow up CT scan to review adrenal nodule noted on CT last year. I have pended order. I believe she will also need to complete bmet prior to CT for contrast.

## 2016-09-04 NOTE — Telephone Encounter (Signed)
Noted and agree. 

## 2016-09-04 NOTE — Telephone Encounter (Signed)
Spoke with pt and she voices understanding re: below. Pt also mentions that she has been having problems with her breathing again. Upon further questioning, pt states she has been having episodes where her heart speeds up and it has been making her cough. Has had some shortness of breath with exertion and had some pain in the left side of her neck yesterday. States she took an aspirin and and extra blood pressure pill yesterday when that happened. Advised pt that she should be evaluated in the ER today and to call us tomorrow with an update and to address below recommendations. Pt voices understanding and is in agreement with plan.

## 2016-09-04 NOTE — Telephone Encounter (Signed)
Patient returning call best # [phone] (M)

## 2016-09-04 NOTE — Telephone Encounter (Signed)
Left message for pt to return my call.

## 2016-09-05 NOTE — Telephone Encounter (Signed)
Dr Charlett Blake-- FYI:  Spoke with PCP this morning as pt did not go to the ER yesterday as advised below. PCP said ok to bring pt in to the office for evaluation. Spoke with pt. She states she has had no further episodes of heart racing and that is why she did not go to the ER. Scheduled pt to see Dr Charlett Blake tomorrow at [facility] as PCP is out of the office tomorrow. Advised pt of symptoms that would require ER evaluation prior to appt tomorrow and she voices understanding.

## 2016-09-06 ENCOUNTER — Encounter: Payer: Self-pay | Admitting: Family Medicine

## 2016-09-06 ENCOUNTER — Ambulatory Visit (INDEPENDENT_AMBULATORY_CARE_PROVIDER_SITE_OTHER): Payer: PRIVATE HEALTH INSURANCE | Admitting: Family Medicine

## 2016-09-06 VITALS — BP 136/80 | HR 69 | Temp 98.6°F | Ht 60.25 in | Wt 209.4 lb

## 2016-09-06 DIAGNOSIS — R7303 Prediabetes: Secondary | ICD-10-CM | POA: Diagnosis not present

## 2016-09-06 DIAGNOSIS — E8809 Other disorders of plasma-protein metabolism, not elsewhere classified: Secondary | ICD-10-CM

## 2016-09-06 DIAGNOSIS — D509 Iron deficiency anemia, unspecified: Secondary | ICD-10-CM

## 2016-09-06 DIAGNOSIS — R002 Palpitations: Secondary | ICD-10-CM

## 2016-09-06 DIAGNOSIS — I10 Essential (primary) hypertension: Secondary | ICD-10-CM

## 2016-09-06 DIAGNOSIS — R0789 Other chest pain: Secondary | ICD-10-CM

## 2016-09-06 DIAGNOSIS — R779 Abnormality of plasma protein, unspecified: Secondary | ICD-10-CM

## 2016-09-06 LAB — COMPREHENSIVE METABOLIC PANEL
ALBUMIN: 3.9 g/dL (ref 3.5–5.2)
ALK PHOS: 41 U/L (ref 39–117)
ALT: 26 U/L (ref 0–35)
AST: 22 U/L (ref 0–37)
BILIRUBIN TOTAL: 0.5 mg/dL (ref 0.2–1.2)
BUN: 14 mg/dL (ref 6–23)
CALCIUM: 10.2 mg/dL (ref 8.4–10.5)
CO2: 31 mEq/L (ref 19–32)
Chloride: 103 mEq/L (ref 96–112)
Creatinine, Ser: 0.69 mg/dL (ref 0.40–1.20)
GFR: 113.52 mL/min (ref >60.00)
Glucose, Bld: 88 mg/dL (ref 70–99)
Potassium: 4.7 mEq/L (ref 3.5–5.1)
SODIUM: 138 meq/L (ref 135–145)
TOTAL PROTEIN: 9.7 g/dL — AB (ref 6.0–8.3)

## 2016-09-06 LAB — LIPID PANEL
CHOLESTEROL: 131 mg/dL (ref 0–200)
HDL: 49.8 mg/dL (ref >39.00)
LDL Cholesterol: 71 mg/dL (ref 0–99)
NonHDL: 81.42
TRIGLYCERIDES: 53 mg/dL (ref 0.0–149.0)
Total CHOL/HDL Ratio: 3
VLDL: 10.6 mg/dL (ref 0.0–40.0)

## 2016-09-06 LAB — CBC
HCT: 41.1 % (ref 36.0–46.0)
Hemoglobin: 13.2 g/dL (ref 12.0–15.0)
MCHC: 32.2 g/dL (ref 30.0–36.0)
MCV: 87 fl (ref 78.0–100.0)
Platelets: 211 10*3/uL (ref 150.0–400.0)
RBC: 4.72 Mil/uL (ref 3.87–5.11)
RDW: 14.1 % (ref 11.5–15.5)
WBC: 2.7 10*3/uL — ABNORMAL LOW (ref 4.0–10.5)

## 2016-09-06 LAB — HEMOGLOBIN A1C: Hgb A1c MFr Bld: 6.1 % (ref 4.6–6.5)

## 2016-09-06 LAB — TSH: TSH: 1.13 u[IU]/mL (ref 0.35–4.50)

## 2016-09-06 MED ORDER — ASPIRIN EC 81 MG PO TBEC: 81.0000 mg | DELAYED_RELEASE_TABLET | Freq: Every day | ORAL | Status: DC

## 2016-09-06 MED ORDER — NITROGLYCERIN 0.4 MG SL SUBL: 0.4000 mg | SUBLINGUAL_TABLET | SUBLINGUAL | 1 refills | Status: DC | PRN

## 2016-09-06 NOTE — Progress Notes (Signed)
Subjective:    Patient ID: Jovita Gamma, female    DOB: 03/16/1960, 56 y.o.   MRN: [id number]  Chief Complaint  Patient presents with  . Palpitations    2 weeks-comes and goes along with chest pain and SOB    HPI Patient is in today for palpitations. She acknowledges she has had short lived palpitations for years but over past few weeks they have increased in intensity, frequency and length. She is noting some chest discomfort when they occur and even some fleeting SOB when this happens. No symptoms in the office today. She had been advised t go to the ER when she called in yesterday but she refused so she is here today for evaluation. Denies HA/congestion/fevers/GI or GU c/o. Taking meds as prescribed  Past Medical History:  Diagnosis Date  . Anemia   . Arthritis   . Blood transfusion without reported diagnosis   . Colon polyps   . Ectopic pregnancy    RIGHT salpingostomy  . Elective abortion   . GERD (gastroesophageal reflux disease)   . Glaucoma    both eyes  . Headache    migraines  . History of chicken pox   . Hypertension   . Rheumatoid arthritis(714.0) 11/07/2011  . SAB (spontaneous abortion)   . Sjogren's disease [facility])     Past Surgical History:  Procedure Laterality Date  . ABDOMINAL SURGERY     bowel repair  . COLONOSCOPY    . DILATION AND CURETTAGE OF UTERUS     X 2  . MENISCUS REPAIR Right    MENISCUS TEAR REPAIR  . MYOMECTOMY N/A 07/27/2014   Procedure: ABDOMINAL MYOMECTOMY;  Surgeon: Waymon Amato, MD;  Location: [facility];  Service: Gynecology;  Laterality: N/A;  . PELVIC LAPAROSCOPY  1994   IN 1999 LAPAROSCOPY WITH INCIDENTAL ENTEROTOMY REQUIRING LAPAROTOMY  . UTERINE FIBROID SURGERY     July 2016    Family History  Problem Relation Age of Onset  . Hypertension Mother        died from heart disease  . Heart disease Mother        deceased, unknown cause  . Diabetes Sister   . Hypertension Sister   . Esophageal cancer Sister   . Stomach cancer  Sister   . Cancer Sister   . Diabetes Sister   . Rectal cancer Neg Hx   . Colon cancer Neg Hx     Social History   Social History  . Marital status: Single    Spouse name: N/A  . Number of children: 0  . 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 Smoking status: Never Smoker  . Smokeless tobacco: Never Used  . Alcohol use No  . Drug use: No  . Sexual activity: Yes    Birth control/ protection: None     Comment: 1st intercourse- 31, partners- 5   Other Topics Concern  . 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  Had a sister up in [place] who passed away March 09, 2015 who she was close to.   Born in [place]- she came to [place] as adult.  Age 47.  Outpatient Medications Prior to Visit  Medication Sig Dispense Refill  . Cyanocobalamin (VITAMIN B-12) 5000 MCG LOZG Take 1 tablet by mouth 2 (two) times daily.    . ferrous sulfate 325 (65 FE) MG tablet Take 325 mg by mouth 3 (three) times daily with meals.     . furosemide (LASIX) 20 MG tablet Take 1 tablet (20 mg total) by mouth daily as needed for fluid. 90 tablet 1  . hydroxychloroquine (PLAQUENIL) 200 MG tablet Take 200 mg by mouth 2 (two) times daily.     Marland Kitchen lisinopril (PRINIVIL,ZESTRIL) 40 MG tablet Take 1 tablet (40 mg total) by mouth daily. 30 tablet 3  . methocarbamol (ROBAXIN) 500 MG tablet Take 1 tablet (500 mg total) by mouth every 8 (eight) hours as needed. 60 tablet 1  . metoprolol succinate (TOPROL-XL) 100 MG 24 hr tablet Take 1 tablet (100 mg total) by mouth daily. Take with or immediately following a meal. 30 tablet 2  . potassium chloride (K-DUR) 10 MEQ tablet Take 2 tablets (20 mEq total) by mouth daily. On the days you take lasix 90 tablet 1  . SUMAtriptan (IMITREX) 50  MG tablet TAKE ONE TABLET BY MOUTH EVERY 2 HOURS AS NEEDED FOR  MIGRAINE  MAY  REPEAT  IN  2  HOURS  IF  HEADACHE  PERSISTS  OR  RECURS 9 tablet 1  . TRAVATAN Z 0.004 % SOLN ophthalmic solution Place 1 drop into both eyes at bedtime.      No facility-administered medications prior to visit.     No Known Allergies  Review of Systems  Constitutional: Negative for fever and malaise/fatigue.  HENT: Negative for congestion.   Eyes: Negative for blurred vision.  Respiratory: Positive for shortness of breath.   Cardiovascular: Positive for chest pain and palpitations. Negative for leg swelling.  Gastrointestinal: Negative for abdominal pain, blood in stool and nausea.  Genitourinary: Negative for dysuria and frequency.  Musculoskeletal: Negative for falls.  Skin: Negative for rash.  Neurological: Negative for dizziness, loss of consciousness and headaches.  Endo/Heme/Allergies: Negative for environmental allergies.  Psychiatric/Behavioral: Negative for depression. The patient is not nervous/anxious.        Objective:    Physical Exam  Constitutional: She is oriented to person, place, and time. She appears well-developed and well-nourished. No distress.  HENT:  Head: Normocephalic and atraumatic.  Nose: Nose normal.  Eyes: Right eye exhibits no discharge. Left eye exhibits no discharge.  Neck: Normal range of motion. Neck supple.  Cardiovascular: Normal rate and regular rhythm.   No murmur heard. Pulmonary/Chest: Effort normal and breath sounds normal.  Abdominal: Soft. Bowel sounds are normal. There is no tenderness.  Musculoskeletal: She exhibits no edema.  Neurological: She is alert and oriented to person, place, and time.  Skin: Skin is warm and dry.  Psychiatric: She has a normal mood and affect.  Nursing note and vitals reviewed.   BP 136/80 (BP Location: Left Arm, Patient Position: Sitting, Cuff Size: Large)   Pulse 69   Temp 98.6 F (37 C) (Oral)   Ht 5' 0.25" (1.53 m)    Wt 209 lb 6.4 oz (95 kg)   LMP 06/01/2016 (Approximate)   SpO2 98%   BMI 40.56 kg/m  Wt Readings from Last 3 Encounters:  09/06/16 209 lb 6.4 oz (95 kg)  06/14/16 209 lb (94.8 kg)  05/14/16 206 lb 9.6 oz (93.7 kg)     Lab Results  Component Value Date   WBC 2.7 (L)  09/06/2016   HGB 13.2 09/06/2016   HCT 41.1 09/06/2016   PLT 211.0 09/06/2016   GLUCOSE 88 09/06/2016   CHOL 131 09/06/2016   TRIG 53.0 09/06/2016   HDL 49.80 09/06/2016   LDLCALC 71 09/06/2016   ALT 26 09/06/2016   AST 22 09/06/2016   NA 138 09/06/2016   K 4.7 09/06/2016   CL 103 09/06/2016   CREATININE 0.69 09/06/2016   BUN 14 09/06/2016   CO2 31 09/06/2016   TSH 1.13 09/06/2016   INR 1.01 09/09/2013   HGBA1C 6.1 09/06/2016   MICROALBUR 3.20 (H) 11/07/2011    Lab Results  Component Value Date   TSH 1.13 09/06/2016   Lab Results  Component Value Date   WBC 2.7 (L) 09/06/2016   HGB 13.2 09/06/2016   HCT 41.1 09/06/2016   MCV 87.0 09/06/2016   PLT 211.0 09/06/2016   Lab Results  Component Value Date   NA 138 09/06/2016   K 4.7 09/06/2016   CHLORIDE 108 01/21/2015   CO2 31 09/06/2016   GLUCOSE 88 09/06/2016   BUN 14 09/06/2016   CREATININE 0.69 09/06/2016   BILITOT 0.5 09/06/2016   ALKPHOS 41 09/06/2016   AST 22 09/06/2016   ALT 26 09/06/2016   PROT 9.7 (H) 09/06/2016   ALBUMIN 3.9 09/06/2016   CALCIUM 10.2 09/06/2016   ANIONGAP 7 08/06/2015   EGFR >90 01/21/2015   GFR 113.52 09/06/2016   Lab Results  Component Value Date   CHOL 131 09/06/2016   Lab Results  Component Value Date   HDL 49.80 09/06/2016   Lab Results  Component Value Date   LDLCALC 71 09/06/2016   Lab Results  Component Value Date   TRIG 53.0 09/06/2016   Lab Results  Component Value Date   CHOLHDL 3 09/06/2016   Lab Results  Component Value Date   HGBA1C 6.1 09/06/2016       Assessment & Plan:   Problem List Items Addressed This Visit    HTN (hypertension)    Well controlled, no changes to  meds. Encouraged heart healthy diet such as the DASH diet and exercise as tolerated.       Relevant Medications   nitroGLYCERIN (NITROSTAT) 0.4 MG SL tablet   aspirin EC 81 MG tablet   Other Relevant Orders   CBC (Completed)   Lipid panel (Completed)   TSH (Completed)   Borderline diabetes     minimize simple carbs. Increase exercise as tolerated.       Relevant Orders   Hemoglobin A1c (Completed)   Comprehensive metabolic panel (Completed)   Lipid panel (Completed)   Palpitations    Worsening frequency, intensity and length of symptoms now with some associated chest discomfort, sob. Started on 81 mg ecasa, given NTG to use prn as directed and referred to cardiology. No acute concerns noted on EKG. To er if chest pain recurs and does not resolve with NTG      Relevant Orders   TSH (Completed)   Iron deficiency anemia    Check cbc       Other Visit Diagnoses    Palpitation    -  Primary   Relevant Orders   EKG 12-Lead (Completed)   CBC (Completed)   Ambulatory referral to Cardiology   Atypical chest pain       Relevant Orders   Ambulatory referral to Cardiology   Elevated blood protein       Relevant Orders   Protein Electrophoresis, (serum)  I am having Ms. Dildine start on nitroGLYCERIN and aspirin EC. I am also having her maintain her ferrous sulfate, hydroxychloroquine, TRAVATAN Z, Vitamin B-12, furosemide, potassium chloride, methocarbamol, metoprolol succinate, lisinopril, SUMAtriptan, and SF 5000 PLUS.  Meds ordered this encounter  Medications  . SF 5000 PLUS 1.1 % CREA dental cream    Sig: As directed.  . nitroGLYCERIN (NITROSTAT) 0.4 MG SL tablet    Sig: Place 1 tablet (0.4 mg total) under the tongue every 5 (five) minutes as needed for chest pain.    Dispense:  25 tablet    Refill:  1  . aspirin EC 81 MG tablet    Sig: Take 1 tablet (81 mg total) by mouth daily.     Penni Homans, MD

## 2016-09-06 NOTE — Assessment & Plan Note (Signed)
Worsening frequency, intensity and length of symptoms now with some associated chest discomfort, sob. Started on 81 mg ecasa, given NTG to use prn as directed and referred to cardiology. No acute concerns noted on EKG. To er if chest pain recurs and does not resolve with NTG

## 2016-09-06 NOTE — Assessment & Plan Note (Signed)
Check cbc 

## 2016-09-06 NOTE — Assessment & Plan Note (Signed)
Well controlled, no changes to meds. Encouraged heart healthy diet such as the DASH diet and exercise as tolerated.  °

## 2016-09-06 NOTE — Assessment & Plan Note (Signed)
minimize simple carbs. Increase exercise as tolerated.  

## 2016-09-06 NOTE — Patient Instructions (Signed)
Palpitations A palpitation is the feeling that your heartbeat is irregular or is faster than normal. It may feel like your heart is fluttering or skipping a beat. Palpitations are usually not a serious problem. They may be caused by many things, including smoking, caffeine, alcohol, stress, and certain medicines. Although most causes of palpitations are not serious, palpitations can be a sign of a serious medical problem. In some cases, you may need further medical evaluation. Follow these instructions at home: Pay attention to any changes in your symptoms. Take these actions to help with your condition:  Avoid the following: ? Caffeinated coffee, tea, soft drinks, diet pills, and energy drinks. ? Chocolate. ? Alcohol.  Do not use any tobacco products, such as cigarettes, chewing tobacco, and e-cigarettes. If you need help quitting, ask your health care provider.  Try to reduce your stress and anxiety. Things that can help you relax include: ? Yoga. ? Meditation. ? Physical activity, such as swimming, jogging, or walking. ? Biofeedback. This is a method that helps you learn to use your mind to control things in your body, such as your heartbeats.  Get plenty of rest and sleep.  Take over-the-counter and prescription medicines only as told by your health care provider.  Keep all follow-up visits as told by your health care provider. This is important.  Contact a health care provider if:  You continue to have a fast or irregular heartbeat after 24 hours.  Your palpitations occur more often. Get help right away if:  You have chest pain or shortness of breath.  You have a severe headache.  You feel dizzy or you faint. This information is not intended to replace advice given to you by your health care provider. Make sure you discuss any questions you have with your health care provider. Document Released: 12/16/1999 Document Revised: 05/23/2015 Document Reviewed: 09/02/2014 Elsevier  Interactive Patient Education  2017 Elsevier Inc.  

## 2016-09-13 ENCOUNTER — Other Ambulatory Visit (HOSPITAL_BASED_OUTPATIENT_CLINIC_OR_DEPARTMENT_OTHER): Payer: PRIVATE HEALTH INSURANCE

## 2016-09-13 ENCOUNTER — Telehealth: Payer: Self-pay

## 2016-09-13 ENCOUNTER — Ambulatory Visit (HOSPITAL_BASED_OUTPATIENT_CLINIC_OR_DEPARTMENT_OTHER): Payer: PRIVATE HEALTH INSURANCE | Admitting: Oncology

## 2016-09-13 ENCOUNTER — Telehealth: Payer: Self-pay | Admitting: *Deleted

## 2016-09-13 VITALS — BP 145/81 | HR 74 | Temp 98.6°F | Resp 20 | Ht 60.25 in | Wt 211.5 lb

## 2016-09-13 DIAGNOSIS — D500 Iron deficiency anemia secondary to blood loss (chronic): Secondary | ICD-10-CM | POA: Diagnosis not present

## 2016-09-13 DIAGNOSIS — D508 Other iron deficiency anemias: Secondary | ICD-10-CM

## 2016-09-13 DIAGNOSIS — N921 Excessive and frequent menstruation with irregular cycle: Secondary | ICD-10-CM

## 2016-09-13 DIAGNOSIS — D509 Iron deficiency anemia, unspecified: Secondary | ICD-10-CM

## 2016-09-13 DIAGNOSIS — D709 Neutropenia, unspecified: Secondary | ICD-10-CM

## 2016-09-13 DIAGNOSIS — N946 Dysmenorrhea, unspecified: Secondary | ICD-10-CM

## 2016-09-13 LAB — CBC WITH DIFFERENTIAL/PLATELET
BASO%: 0 % (ref 0.0–2.0)
Basophils Absolute: 0 10*3/uL (ref 0.0–0.1)
EOS ABS: 0.1 10*3/uL (ref 0.0–0.5)
EOS%: 2.8 % (ref 0.0–7.0)
HEMATOCRIT: 39.6 % (ref 34.8–46.6)
HEMOGLOBIN: 12.4 g/dL (ref 11.6–15.9)
LYMPH#: 1.4 10*3/uL (ref 0.9–3.3)
LYMPH%: 56.7 % — ABNORMAL HIGH (ref 14.0–49.7)
MCH: 27.5 pg (ref 25.1–34.0)
MCHC: 31.3 g/dL — ABNORMAL LOW (ref 31.5–36.0)
MCV: 87.8 fL (ref 79.5–101.0)
MONO#: 0.2 10*3/uL (ref 0.1–0.9)
MONO%: 7.1 % (ref 0.0–14.0)
NEUT%: 33.4 % — ABNORMAL LOW (ref 38.4–76.8)
NEUTROS ABS: 0.8 10*3/uL — AB (ref 1.5–6.5)
Platelets: 194 10*3/uL (ref 145–400)
RBC: 4.51 10*6/uL (ref 3.70–5.45)
RDW: 14.4 % (ref 11.2–14.5)
WBC: 2.5 10*3/uL — AB (ref 3.9–10.3)

## 2016-09-13 LAB — FERRITIN: FERRITIN: 40 ng/mL (ref 9–269)

## 2016-09-13 LAB — IRON AND TIBC
%SAT: 24 % (ref 21–57)
IRON: 79 ug/dL (ref 41–142)
TIBC: 326 ug/dL (ref 236–444)
UIBC: 247 ug/dL (ref 120–384)

## 2016-09-13 NOTE — Telephone Encounter (Signed)
Gave patient avs and calender for March. Per 9/13 los

## 2016-09-13 NOTE — Telephone Encounter (Signed)
As noted below by Dr. Alen Blew, I left a message informing patient of her iron results. Also, she can take iron once a day instead of three times a day. Instructed her to call [facility] if she had any questions or concerns.

## 2016-09-13 NOTE — Telephone Encounter (Signed)
-----   Message from Wyatt Portela, MD sent at 09/13/2016 11:18 AM EDT ----- Please let her know her iron is normal. She can go to taking iron once day instead of three.

## 2016-09-13 NOTE — Progress Notes (Signed)
Hematology and Oncology Follow Up Visit  Amber [name] [id number] 11-28-1960 56 y.o. 09/13/2016 10:03 AM   Principle Diagnosis: 56 year old woman with iron deficiency anemia due to menorrhagia and uterine fibroids. 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  Prior Therapy:  She is status post IV iron infusion in the form of Feraheme given on 05/05/2012, 10/2012 and 09/2013.  The most recent of which done in June 2016.  Current therapy: Iron sulfate 325 mg by mouth 3 times a day.  Interim History: Amber [name] presents today for a followup visit. Since the last visit, she reports no changes in her health. She continues to be active and works full time. She continues to take iron 3 times a day with mild constipation. She is no longer reporting any menstrual bleeding at this time. Her last cycle was in May 2018. She denied any hematochezia, melena or epistaxis. Her energy and performance status has improved and remained stable. She denied any chest pain or difficulty breathing. She denied dyspnea on exertion.   She does not report any headaches, blurry vision, syncope or seizures. She has not reported any chest pain or shortness of breath. She does not report any constitutional symptoms of fevers chills or sweats. She does not report any frequency urgency or hesitancy. She does not report any lymphadenopathy or petechiae. Remainder or view of system is unremarkable.  Medications: I have reviewed the patient's current medications.  Current Outpatient Prescriptions  Medication Sig Dispense Refill  . aspirin EC 81 MG tablet Take 1 tablet (81 mg total) by mouth daily.    . Cyanocobalamin (VITAMIN B-12) 5000 MCG LOZG Take 1 tablet by mouth 2 (two) times daily.    . ferrous sulfate 325 (65 FE) MG tablet Take 325 mg by mouth 3 (three) times daily with meals.     . furosemide (LASIX) 20 MG tablet Take 1 tablet (20 mg total) by mouth daily as needed for fluid. 90  tablet 1  . hydroxychloroquine (PLAQUENIL) 200 MG tablet Take 200 mg by mouth 2 (two) times daily.     Marland Kitchen lisinopril (PRINIVIL,ZESTRIL) 40 MG tablet Take 1 tablet (40 mg total) by mouth daily. 30 tablet 3  . methocarbamol (ROBAXIN) 500 MG tablet Take 1 tablet (500 mg total) by mouth every 8 (eight) hours as needed. 60 tablet 1  . metoprolol succinate (TOPROL-XL) 100 MG 24 hr tablet Take 1 tablet (100 mg total) by mouth daily. Take with or immediately following a meal. 30 tablet 2  . nitroGLYCERIN (NITROSTAT) 0.4 MG SL tablet Place 1 tablet (0.4 mg total) under the tongue every 5 (five) minutes as needed for chest pain. 25 tablet 1  . potassium chloride (K-DUR) 10 MEQ tablet Take 2 tablets (20 mEq total) by mouth daily. On the days you take lasix 90 tablet 1  . SF 5000 PLUS 1.1 % CREA dental cream As directed.    . SUMAtriptan (IMITREX) 50 MG tablet TAKE ONE TABLET BY MOUTH EVERY 2 HOURS AS NEEDED FOR  MIGRAINE  MAY  REPEAT  IN  2  HOURS  IF  HEADACHE  PERSISTS  OR  RECURS 9 tablet 1  . TRAVATAN Z 0.004 % SOLN ophthalmic solution Place 1 drop into both eyes at bedtime.      No current facility-administered medications for this visit.      Allergies: No Known Allergies    Physical Exam: Blood pressure (!) 145/81, pulse 74, temperature 98.6 F (37  C), temperature source Oral, resp. rate 20, height 5' 0.25" (1.53 m), weight 211 lb 8 oz (95.9 kg), SpO2 100 %. ECOG: 0 General appearance: Well-appearing woman without distress. Head: Normocephalic. No oral ulcers or thrush. Sclerae anicteric. Neck: no adenopathy or masses. No thyromegaly. Lymph nodes: Cervical, supraclavicular, and axillary nodes normal. Heart:regular rate and rhythm, S1, S2 normal, no murmur, click, rub or gallop Lung:chest clear, no wheezing, rales, normal symmetric air entry no dullness to percussion. Abdomen: soft without masses or organomegaly no dullness or ascites. No rebound or guarding. EXT:no erythema, or  edema.   Lab Results: Lab Results  Component Value Date   WBC 2.5 (L) 09/13/2016   HGB 12.4 09/13/2016   HCT 39.6 09/13/2016   MCV 87.8 09/13/2016   PLT 194 09/13/2016     Chemistry      Component Value Date/Time   NA 138 09/06/2016 1029   NA 138 01/21/2015 0935   K 4.7 09/06/2016 1029   K 3.9 01/21/2015 0935   CL 103 09/06/2016 1029   CL 108 (H) 06/25/2012 1508   CO2 31 09/06/2016 1029   CO2 25 01/21/2015 0935   BUN 14 09/06/2016 1029   BUN 7.0 01/21/2015 0935   CREATININE 0.69 09/06/2016 1029   CREATININE 0.8 01/21/2015 0935      Component Value Date/Time   CALCIUM 10.2 09/06/2016 1029   CALCIUM 9.4 01/21/2015 0935   ALKPHOS 41 09/06/2016 1029   ALKPHOS 54 01/21/2015 0935   AST 22 09/06/2016 1029   AST 17 01/21/2015 0935   ALT 26 09/06/2016 1029   ALT 15 01/21/2015 0935   BILITOT 0.5 09/06/2016 1029   BILITOT 0.55 01/21/2015 0935       Impression and Plan:  56 year old woman with the following issues:  1. Iron deficiency anemia due to menorrhagia: She is status post IV iron on June 2016 and currently on oral iron.   Hemoglobin is normal and iron studies in March 2018 all within normal range.  The plan is to continue follow her iron studies and decrease her oral iron intake depending on her iron studies given her menstrual cycles has decreased and potentially stopped.  2. Leukocytopenia: Bone marrow biopsy done on 09/09/2013 did not show any evidence of dysplasia or infiltrative bone marrow process. There is no evidence of any lymphoproliferative disease.   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 No recent infections or hospitalizations.  3. Rheumatoid arthritis: She currently on Plaquenil and seems to be adequately controlling her disease.  4. Followup will be in 6 months to recheck your iron stores.   Amber [name] 9/13/201810:03 AM

## 2016-09-14 ENCOUNTER — Other Ambulatory Visit (INDEPENDENT_AMBULATORY_CARE_PROVIDER_SITE_OTHER): Payer: PRIVATE HEALTH INSURANCE

## 2016-09-14 DIAGNOSIS — R779 Abnormality of plasma protein, unspecified: Secondary | ICD-10-CM

## 2016-09-14 DIAGNOSIS — E8809 Other disorders of plasma-protein metabolism, not elsewhere classified: Secondary | ICD-10-CM

## 2016-09-14 NOTE — Addendum Note (Signed)
Addended by: Caffie Pinto on: 09/14/2016 08:17 AM   Modules accepted: Orders

## 2016-09-18 LAB — PROTEIN ELECTROPHORESIS, SERUM
ALBUMIN ELP: 4.1 g/dL (ref 3.8–4.8)
ALPHA 1: 0.3 g/dL (ref 0.2–0.3)
Alpha 2: 0.7 g/dL (ref 0.5–0.9)
BETA 2: 0.6 g/dL — AB (ref 0.2–0.5)
Beta Globulin: 0.5 g/dL (ref 0.4–0.6)
GAMMA GLOBULIN: 3.5 g/dL — AB (ref 0.8–1.7)
TOTAL PROTEIN: 9.6 g/dL — AB (ref 6.1–8.1)

## 2016-09-27 ENCOUNTER — Telehealth: Payer: Self-pay | Admitting: Family

## 2016-09-27 DIAGNOSIS — E278 Other specified disorders of adrenal gland: Secondary | ICD-10-CM

## 2016-09-27 NOTE — Telephone Encounter (Signed)
Contacted [employer] for peer to peer on ct abd pelvis. They will have MD call me to discuss.

## 2016-09-30 NOTE — Telephone Encounter (Signed)
Spoke with [employer], peer to peer.  CT approved on 09/27/16

## 2016-10-01 LAB — HM MAMMOGRAPHY

## 2016-10-01 NOTE — Progress Notes (Signed)
Cardiology Office Note    Date:  10/03/2016   ID:  Amber [name], DOB 1960/07/19, MRN [id number]  PCP:  Amber Alar, NP  Cardiologist:  New to Dr. Meda Coffee  Chief Complaint: Palpitation   History of Present Illness:   Amber [name] is a 56 y.o. female  iron deficiency anemia due to menorrhagia and uterine fibroids (follow by Dr. Alen Blew), leukocytopenia ( no dysplasia or infiltrative bone marrow process on bone marrow biopsy 09/09/13), RA and HTN referred by [facility] for palpitation and chest pain.   Stress test 10/2012 Overall Impression:  Low risk stress nuclear study with a small, mild partially reversible mid anterior perfusion defect.  This possibly represents attenuation, but cannot completely rule out prior infarction wtih peri-infarction ischemia.. LV Ejection Fraction: 78%.  LV Wall Motion:  NL LV Function; NL Wall Motion  Seen by PCP 09/06/16 for palpitation and atypical chest pain . EKG showed NSR - personally reviewed. Electrolytes, serum creatinine, LFTs and hemoglobin within normal limits. Hemoglobin A1c 6.1. Normal TSH. 09/06/2016: Cholesterol 131; HDL 49.80; LDL Cholesterol 71; Triglycerides 53.0; VLDL 10.6  Here today for further evaluation. Patient having palpitation for the past 5-6 month. It was intermittent,now daily. Resolves in a few seconds with cough. She also complains of intermittent chest tightness with dyspnea with and without exertion. Her symptoms relieves with rest. She required sublingual nitroglycerin 2 times in last months. First time she required 2 and second time 1 with resolution of symptoms. She also has dyspnea on exertion. She denies orthopnea, PND, syncope, melena or blood in his stool or urine. She takes Lasix when necessary for lower extremity edema, likely require every other day. She eats a low-sodium diet. Denies history of tobacco abuse, alcohol or illicit drug use. Blood pressure running high despite increased antihypertensive  regimen. 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 No regular exercise.   Mother died of MI at age 35. Sister has history of hypertension.  Past Medical History:  Diagnosis Date  . Anemia   . Arthritis   . Blood transfusion without reported diagnosis   . Colon polyps   . Ectopic pregnancy    RIGHT salpingostomy  . Elective abortion   . GERD (gastroesophageal reflux disease)   . Glaucoma    both eyes  . Headache    migraines  . History of chicken pox   . Hypertension   . Rheumatoid arthritis(714.0) 11/07/2011  . SAB (spontaneous abortion)   . Sjogren's disease [facility])     Past Surgical History:  Procedure Laterality Date  . ABDOMINAL SURGERY     bowel repair  . COLONOSCOPY    . DILATION AND CURETTAGE OF UTERUS     X 2  . MENISCUS REPAIR Right    MENISCUS TEAR REPAIR  . MYOMECTOMY N/A 07/27/2014   Procedure: ABDOMINAL MYOMECTOMY;  Surgeon: Amber Amato, MD;  Location: [facility];  Service: Gynecology;  Laterality: N/A;  . PELVIC LAPAROSCOPY  1994   IN 1999 LAPAROSCOPY WITH INCIDENTAL ENTEROTOMY REQUIRING LAPAROTOMY  . UTERINE FIBROID SURGERY     July 2016    Current Medications: Prior to Admission medications   Medication Sig Start Date End Date Taking? Authorizing Provider  aspirin EC 81 MG tablet Take 1 tablet (81 mg total) by mouth daily. 09/06/16   Amber Lukes, MD  Cyanocobalamin (VITAMIN B-12) 5000 MCG LOZG Take 1 tablet by mouth 2 (two) times daily. 11/04/15   [provider]  ferrous sulfate 325 (65 FE)  MG tablet Take 325 mg by mouth 3 (three) times daily with meals.     [provider]  furosemide (LASIX) 20 MG tablet Take 1 tablet (20 mg total) by mouth daily as needed for fluid. 12/09/15   Amber Alar, NP  hydroxychloroquine (PLAQUENIL) 200 MG tablet Take 200 mg by mouth 2 (two) times daily.     [provider]  lisinopril (PRINIVIL,ZESTRIL) 40 MG tablet Take 1 tablet (40 mg total) by mouth daily. 05/14/16   Amber Alar, NP    methocarbamol (ROBAXIN) 500 MG tablet Take 1 tablet (500 mg total) by mouth every 8 (eight) hours as needed. 01/17/16   Hudnall, Sharyn Lull, MD  metoprolol succinate (TOPROL-XL) 100 MG 24 hr tablet Take 1 tablet (100 mg total) by mouth daily. Take with or immediately following a meal. 03/15/16   Wendling, Crosby Oyster, DO  nitroGLYCERIN (NITROSTAT) 0.4 MG SL tablet Place 1 tablet (0.4 mg total) under the tongue every 5 (five) minutes as needed for chest pain. 09/06/16   Amber Lukes, MD  potassium chloride (K-DUR) 10 MEQ tablet Take 2 tablets (20 mEq total) by mouth daily. On the days you take lasix 12/09/15 12/20/16  Amber Alar, NP  SF 5000 PLUS 1.1 % CREA dental cream As directed. 08/29/16   [provider]  SUMAtriptan (IMITREX) 50 MG tablet TAKE ONE TABLET BY MOUTH EVERY 2 HOURS AS NEEDED FOR  MIGRAINE  MAY  REPEAT  IN  2  HOURS  IF  HEADACHE  PERSISTS  OR  RECURS 08/03/16   Amber Alar, NP  TRAVATAN Z 0.004 % SOLN ophthalmic solution Place 1 drop into both eyes at bedtime.  10/25/11   [provider]    Allergies:   Patient has no known allergies.   Social History   Social History  . Marital status: Single    Spouse name: N/A  . Number of children: 0  . 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 Smoking status: Never Smoker  . Smokeless tobacco: Never Used  . Alcohol use No  . Drug use: No  . Sexual activity: Yes    Birth control/ protection: None     Comment: 1st intercourse- 38, partners- 5   Other Topics Concern  . 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  Had a sister up in [place] who passed away 2015/02/05 who she was close to.   Born in [place]- she came to [place] as adult.  Age 36.               Family  History:  The patient's family history includes Cancer in her sister; Diabetes in her sister and sister; Esophageal cancer in her sister; Heart disease in her mother; Hypertension in her mother and sister; Stomach cancer in her sister.   ROS:   Please see the history of present illness.    ROS All other systems reviewed and are negative.   PHYSICAL EXAM:   VS:  BP (!) 154/90   Pulse 97   Ht 5' 1" (1.549 m)   Wt 212 lb (96.2 kg)   SpO2 97%   BMI 40.06 kg/m    GEN: Well  nourished, well developed, in no acute distress  HEENT: normal  Neck: no JVD, carotid bruits, or masses Cardiac: RRR; no murmurs, rubs, or gallops,no edema  Respiratory: normal work of breathing. Diminished breath sound at bases. GI: soft, nontender, nondistended, + BS MS: no deformity or atrophy  Skin: warm and dry, no rash Neuro:  Alert and Oriented x 3, Strength and sensation are intact Psych: euthymic mood, full affect  Wt Readings from Last 3 Encounters:  10/03/16 212 lb (96.2 kg)  09/13/16 211 lb 8 oz (95.9 kg)  09/06/16 209 lb 6.4 oz (95 kg)      Studies/Labs Reviewed:   EKG:  EKG is ordered today.  The ekg ordered today demonstrates Sinus rhythm at rate of 81 bpm  Recent Labs: 09/06/2016: ALT 26; BUN 14; Creatinine, Ser 0.69; Potassium 4.7; Sodium 138; TSH 1.13 09/13/2016: HGB 12.4; Platelets 194   Lipid Panel    Component Value Date/Time   CHOL 131 09/06/2016 1029   TRIG 53.0 09/06/2016 1029   HDL 49.80 09/06/2016 1029   CHOLHDL 3 09/06/2016 1029   VLDL 10.6 09/06/2016 1029   LDLCALC 71 09/06/2016 1029    Additional studies/ records that were reviewed today include:   AS above   ASSESSMENT & PLAN:    1. Palpitations - Worsened in the past 2 months. Now occurs daily. Resolved with cough. Normal TSH. Will get holtor monitor.  2. DOE with chest tightness - Worsened recently. Resolves with sublingual nitroglycerin. Concerning for angina or/and CHF. Will get coronary CT and  echocardiogram. Continue ASA. Check BNP and BMP as she required high dose of lasix lately.   3. HTN - BP running high today to 152/90. Continue lisinopril 40 mg daily. Increase Toprol-XL to 150 mg daily.  4. RA - on Plaquenil  Discussed with Dr. Meda Coffee.    Medication Adjustments/Labs and Tests Ordered: Current medicines are reviewed at length with the patient today.  Concerns regarding medicines are outlined above.  Medication changes, Labs and Tests ordered today are listed in the Patient Instructions below. Patient Instructions  Medication Instructions: Your physician has recommended you make the following change in your medication:  -1) INCREASE Toprol 150 mg - Take 150 mg by mouth daily - I have sent both a 100 mg tablet and a 50 mg tablet into your pharmacy  Labwork: Your physician recommends that you have lab work today: BMET and Pro BNP  Procedures/Testing: Your physician has requested that you have cardiac CT. Cardiac computed tomography (CT) is a painless test that uses an x-ray machine to take clear, detailed pictures of your heart. For further information please visit [facility]. Please follow instruction sheet as given.  Your physician has requested that you have an echocardiogram. Echocardiography is a painless test that uses sound waves to create images of your heart. It provides your doctor with information about the size and shape of your heart and how well your heart's chambers and valves are working. This procedure takes approximately one hour. There are no restrictions for this procedure.  Your physician has recommended that you wear a holter monitor. Holter monitors are medical devices that record the heart's electrical activity. Doctors most often use these monitors to diagnose arrhythmias. Arrhythmias are problems with the speed or rhythm of the heartbeat. The monitor is a small, portable device. You can wear one while you do your normal daily activities. This  is usually used to diagnose what is causing palpitations/syncope (passing out).   Follow-Up: Your physician recommends  that you schedule a follow-up appointment in: 3-4 [place] or and APP on Dr. Francesca Oman team ON A DAY THAT DR. Meda Coffee IS IN THE OFFICE   If you need a refill on your cardiac medications before your next appointment, please call your pharmacy.      Jarrett Soho, [place]  10/03/2016 4:09 PM    [facility] [place], [place], [place]  [address] Phone: [phone]; Fax: [phone]

## 2016-10-03 ENCOUNTER — Encounter: Payer: Self-pay | Admitting: Physician Assistant

## 2016-10-03 ENCOUNTER — Ambulatory Visit (INDEPENDENT_AMBULATORY_CARE_PROVIDER_SITE_OTHER): Payer: PRIVATE HEALTH INSURANCE | Admitting: Physician Assistant

## 2016-10-03 VITALS — BP 154/90 | HR 97 | Ht 61.0 in | Wt 212.0 lb

## 2016-10-03 DIAGNOSIS — R002 Palpitations: Secondary | ICD-10-CM

## 2016-10-03 DIAGNOSIS — I10 Essential (primary) hypertension: Secondary | ICD-10-CM | POA: Diagnosis not present

## 2016-10-03 DIAGNOSIS — R0609 Other forms of dyspnea: Secondary | ICD-10-CM

## 2016-10-03 DIAGNOSIS — R0789 Other chest pain: Secondary | ICD-10-CM | POA: Diagnosis not present

## 2016-10-03 MED ORDER — METOPROLOL SUCCINATE ER 100 MG PO TB24: 100.0000 mg | ORAL_TABLET | Freq: Every day | ORAL | 2 refills | Status: DC

## 2016-10-03 MED ORDER — METOPROLOL SUCCINATE ER 50 MG PO TB24: 50.0000 mg | ORAL_TABLET | Freq: Every day | ORAL | 2 refills | Status: DC

## 2016-10-03 NOTE — Patient Instructions (Signed)
Medication Instructions: Your physician has recommended you make the following change in your medication:  -1) INCREASE Toprol 150 mg - Take 150 mg by mouth daily - I have sent both a 100 mg tablet and a 50 mg tablet into your pharmacy  Labwork: Your physician recommends that you have lab work today: BMET and Pro BNP  Procedures/Testing: Your physician has requested that you have cardiac CT. Cardiac computed tomography (CT) is a painless test that uses an x-ray machine to take clear, detailed pictures of your heart. For further information please visit [facility]tn. Please follow instruction sheet as given.  Your physician has requested that you have an echocardiogram. Echocardiography is a painless test that uses sound waves to create images of your heart. It provides your doctor with information about the size and shape of your heart and how well your heart's chambers and valves are working. This procedure takes approximately one hour. There are no restrictions for this procedure.  Your physician has recommended that you wear a holter monitor. Holter monitors are medical devices that record the heart's electrical activity. Doctors most often use these monitors to diagnose arrhythmias. Arrhythmias are problems with the speed or rhythm of the heartbeat. The monitor is a small, portable device. You can wear one while you do your normal daily activities. This is usually used to diagnose what is causing palpitations/syncope (passing out).   Follow-Up: Your physician recommends that you schedule a follow-up appointment in: 3-4 WEEKS WITH DR. Meda Coffee or and APP on Dr. Francesca Oman team ON A DAY THAT DR. Meda Coffee IS IN THE OFFICE   If you need a refill on your cardiac medications before your next appointment, please call your pharmacy.

## 2016-10-04 LAB — BASIC METABOLIC PANEL
BUN/Creatinine Ratio: 14 (ref 9–23)
BUN: 10 mg/dL (ref 6–24)
CALCIUM: 9.6 mg/dL (ref 8.7–10.2)
CHLORIDE: 103 mmol/L (ref 96–106)
CO2: 28 mmol/L (ref 20–29)
CREATININE: 0.72 mg/dL (ref 0.57–1.00)
GFR calc non Af Amer: 95 mL/min/{1.73_m2} (ref >59)
GFR, EST AFRICAN AMERICAN: 109 mL/min/{1.73_m2} (ref >59)
Glucose: 88 mg/dL (ref 65–99)
Potassium: 4.2 mmol/L (ref 3.5–5.2)
Sodium: 140 mmol/L (ref 134–144)

## 2016-10-04 LAB — PRO B NATRIURETIC PEPTIDE: NT-Pro BNP: 24 pg/mL (ref 0–287)

## 2016-10-05 ENCOUNTER — Ambulatory Visit (HOSPITAL_BASED_OUTPATIENT_CLINIC_OR_DEPARTMENT_OTHER)
Admission: RE | Admit: 2016-10-05 | Discharge: 2016-10-05 | Disposition: A | Discharge status: Home / Self Care | Payer: PRIVATE HEALTH INSURANCE | Source: Ambulatory Visit | Attending: Family | Admitting: Family

## 2016-10-05 DIAGNOSIS — E279 Disorder of adrenal gland, unspecified: Secondary | ICD-10-CM | POA: Insufficient documentation

## 2016-10-05 DIAGNOSIS — E278 Other specified disorders of adrenal gland: Secondary | ICD-10-CM

## 2016-10-07 ENCOUNTER — Encounter: Payer: Self-pay | Admitting: Family

## 2016-10-11 ENCOUNTER — Encounter: Payer: Self-pay | Admitting: Family

## 2016-10-18 ENCOUNTER — Other Ambulatory Visit: Payer: Self-pay

## 2016-10-18 ENCOUNTER — Ambulatory Visit (HOSPITAL_COMMUNITY): Payer: PRIVATE HEALTH INSURANCE | Attending: Cardiovascular Disease

## 2016-10-18 ENCOUNTER — Ambulatory Visit (INDEPENDENT_AMBULATORY_CARE_PROVIDER_SITE_OTHER): Payer: PRIVATE HEALTH INSURANCE

## 2016-10-18 DIAGNOSIS — D649 Anemia, unspecified: Secondary | ICD-10-CM | POA: Diagnosis not present

## 2016-10-18 DIAGNOSIS — R0789 Other chest pain: Secondary | ICD-10-CM | POA: Insufficient documentation

## 2016-10-18 DIAGNOSIS — R0609 Other forms of dyspnea: Secondary | ICD-10-CM | POA: Insufficient documentation

## 2016-10-18 DIAGNOSIS — R002 Palpitations: Secondary | ICD-10-CM

## 2016-10-18 DIAGNOSIS — I119 Hypertensive heart disease without heart failure: Secondary | ICD-10-CM | POA: Insufficient documentation

## 2016-10-18 DIAGNOSIS — M069 Rheumatoid arthritis, unspecified: Secondary | ICD-10-CM | POA: Insufficient documentation

## 2016-10-19 ENCOUNTER — Telehealth: Payer: Self-pay | Admitting: *Deleted

## 2016-10-19 NOTE — Telephone Encounter (Signed)
Received request for Medical Records from [facility]; forwarded to [place] for email/scan/SLS 10/19

## 2016-10-23 NOTE — Progress Notes (Signed)
Cardiology Office Note    Date:  10/25/2016   ID:  BRYTNEY SOMES, DOB [birth year], MRN [id number]  PCP:  [name], NP  Cardiologist: Dr. Meda Coffee  Chief Complaint: Palpitation follow up  History of Present Illness:   Amber [name] is a 56 y.o. female female iron deficiency anemia due to menorrhagia and uterine fibroids (follow by Dr. Alen Blew), leukocytopenia ( no dysplasia or infiltrative bone marrow process on bone marrow biopsy 09/09/13), RA and HTN recently seen by me for chest pain and palpitation here for follow up.   Low risk stress test 10/2012. Seen by me few weeks ago for palpitations x 5-6 months (worsen). She also complains of intermittent chest tightness with dyspnea with and without exertion. Her symptoms relieves with rest. Also required nitro. Plan to get monitor, echo and CT coronary. Increased Toprol XL due to elevated BP.   Normal LV function of 20-94%, grade 2 diastolic dysfunction and mild pulmonary hypertension. Pending CT coronary and final result of monitor.  Here today for further discussion. Patient continues to have daily palpitation lasting for 1 minute. She continues to have dyspnea on exertion, released with rest. Her chest pain has resolved without any intervention and did not require any nitroglycerin since last office visit. Intermittent lower extremity edema, orthopnea.   Past Medical History:  Diagnosis Date  . Anemia   . Arthritis   . Blood transfusion without reported diagnosis   . Colon polyps   . Ectopic pregnancy    RIGHT salpingostomy  . Elective abortion   . GERD (gastroesophageal reflux disease)   . Glaucoma    both eyes  . Headache    migraines  . History of chicken pox   . Hypertension   . Rheumatoid arthritis(714.0) 11/07/2011  . SAB (spontaneous abortion)   . Sjogren's disease [facility])     Past Surgical History:  Procedure Laterality Date  . ABDOMINAL SURGERY     bowel repair  . COLONOSCOPY    . DILATION  AND CURETTAGE OF UTERUS     X 2  . MENISCUS REPAIR Right    MENISCUS TEAR REPAIR  . MYOMECTOMY N/A 07/27/2014   Procedure: ABDOMINAL MYOMECTOMY;  Surgeon: Waymon Amato, MD;  Location: [facility];  Service: Gynecology;  Laterality: N/A;  . PELVIC LAPAROSCOPY  1994   IN 1999 LAPAROSCOPY WITH INCIDENTAL ENTEROTOMY REQUIRING LAPAROTOMY  . UTERINE FIBROID SURGERY     July 2016    Current Medications: Prior to Admission medications   Medication Sig Start Date End Date Taking? Authorizing Provider  aspirin EC 81 MG tablet Take 1 tablet (81 mg total) by mouth daily. 09/06/16   Mosie Lukes, MD  Cyanocobalamin (VITAMIN B-12) 5000 MCG LOZG Take 1 tablet by mouth 2 (two) times daily. 11/04/15   [provider]  ferrous sulfate 325 (65 FE) MG tablet Take 325 mg by mouth 3 (three) times daily with meals.     [provider]  furosemide (LASIX) 20 MG tablet Take 1 tablet (20 mg total) by mouth daily as needed for fluid. 12/09/15   [name], NP  hydroxychloroquine (PLAQUENIL) 200 MG tablet Take 200 mg by mouth 2 (two) times daily.     [provider]  lisinopril (PRINIVIL,ZESTRIL) 40 MG tablet Take 1 tablet (40 mg total) by mouth daily. 05/14/16   [name], NP  methocarbamol (ROBAXIN) 500 MG tablet Take 1 tablet (500 mg total) by mouth every 8 (eight) hours as needed. 01/17/16   Hudnall,  Sharyn Lull, MD  metoprolol succinate (TOPROL XL) 50 MG 24 hr tablet Take 1 tablet (50 mg total) by mouth daily. Take with or immediately following a meal. 10/03/16   Ethyl Vila, PA  metoprolol succinate (TOPROL-XL) 100 MG 24 hr tablet Take 1 tablet (100 mg total) by mouth daily. Take with or immediately following a meal. 10/03/16   Louie Meaders, PA  nitroGLYCERIN (NITROSTAT) 0.4 MG SL tablet Place 1 tablet (0.4 mg total) under the tongue every 5 (five) minutes as needed for chest pain. 09/06/16   Mosie Lukes, MD  potassium chloride (K-DUR) 10 MEQ tablet Take 2 tablets (20  mEq total) by mouth daily. On the days you take lasix 12/09/15 12/20/16  [name], NP  SF 5000 PLUS 1.1 % CREA dental cream As directed. 08/29/16   [provider]  SUMAtriptan (IMITREX) 50 MG tablet TAKE ONE TABLET BY MOUTH EVERY 2 HOURS AS NEEDED FOR  MIGRAINE  MAY  REPEAT  IN  2  HOURS  IF  HEADACHE  PERSISTS  OR  RECURS 08/03/16   [name], NP  TRAVATAN Z 0.004 % SOLN ophthalmic solution Place 1 drop into both eyes at bedtime.  10/25/11   [provider]    Allergies:   Patient has no known allergies.   Social History   Social History  . Marital status: Single    Spouse name: N/A  . Number of children: 0  . 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 Smoking status: Never Smoker  . Smokeless tobacco: Never Used  . Alcohol use No  . Drug use: No  . Sexual activity: Yes    Birth control/ protection: None     Comment: 1st intercourse- 56, partners- 5   Other Topics Concern  . 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  Had a sister up in [place] who passed away March 18, 2015 who she was close to.   Born in [place]- she came to [place] as adult.  Age 40.               Family History:  The patient's family history includes Cancer in her sister; Diabetes in her sister and sister; Esophageal cancer in her sister; Heart disease in her mother; Hypertension in her mother and sister; Stomach cancer in her sister.   ROS:   Please see the history of present illness.    ROS All other systems reviewed and are negative.   PHYSICAL EXAM:   VS:  BP 112/82   Pulse 69   Resp 16   Ht 5' (1.524 m)   Wt 216 lb 1.9 oz (98 kg)   LMP 06/01/2016   SpO2 98%   BMI 42.21 kg/m    GEN: Well nourished, well developed, in no acute distress   HEENT: normal  Neck: no JVD, carotid bruits, or masses Cardiac: RRR; no murmurs, rubs, or gallops,no edema  Respiratory:  clear to auscultation bilaterally, normal work of breathing GI: soft, nontender, nondistended, + BS MS: no deformity or atrophy  Skin: warm and dry, no rash Neuro:  Alert and Oriented x 3, Strength and sensation are intact Psych: euthymic mood, full affect  Wt  Readings from Last 3 Encounters:  10/25/16 216 lb 1.9 oz (98 kg)  10/03/16 212 lb (96.2 kg)  09/13/16 211 lb 8 oz (95.9 kg)      Studies/Labs Reviewed:   EKG:  EKG is not ordered today.    Recent Labs: 09/06/2016: ALT 26; TSH 1.13 09/13/2016: HGB 12.4; Platelets 194 10/03/2016: BUN 10; Creatinine, Ser 0.72; NT-Pro BNP 24; Potassium 4.2; Sodium 140   Lipid Panel    Component Value Date/Time   CHOL 131 09/06/2016 1029   TRIG 53.0 09/06/2016 1029   HDL 49.80 09/06/2016 1029   CHOLHDL 3 09/06/2016 1029   VLDL 10.6 09/06/2016 1029   LDLCALC 71 09/06/2016 1029    Additional studies/ records that were reviewed today include:   Echocardiogram: 10/18/16 Study Conclusions  - Left ventricle: The cavity size was normal. There was mild   concentric hypertrophy. Systolic function was normal. The   estimated ejection fraction was in the range of 55% to 60%. Wall   motion was normal; there were no regional wall motion   abnormalities. Features are consistent with a pseudonormal left   ventricular filling pattern, with concomitant abnormal relaxation   and increased filling pressure (grade 2 diastolic dysfunction).   Doppler parameters are consistent with indeterminate ventricular   filling pressure. - Aortic valve: Transvalvular velocity was within the normal range.   There was no stenosis. There was no regurgitation. - Mitral valve: Transvalvular velocity was within the normal range.   There was no evidence for stenosis. There was no regurgitation. - Left atrium: The atrium was moderately dilated. -  Right ventricle: The cavity size was normal. Wall thickness was   normal. Systolic function was normal. - Atrial septum: No defect or patent foramen ovale was identified. - Tricuspid valve: There was trivial regurgitation. - Pulmonary arteries: Systolic pressure was mildly increased. PA   peak pressure: 39 mm Hg (S).  Holter monitor  CT coronary    ASSESSMENT & PLAN:    1. Chronic diastolic heart failure - Currently euvolemic. She feels great when she takes lasix (wires 3-4 times per week). She does not prefer to take Lasix daily due to frequent trips to bathroom. Will stop Lasix. Start hydrochlorothiazide. She will give [place] a call in a week.   2. Palpitation - Continue Toprol-XL 100 mg a.m. and 50 mg p.m. Pending monitor.  3. Dyspnea on exertion - Her chest pain has resolved without any intervention. Not require any nitroglycerin since last VISIT. Pending CT coronary for further evaluation.  4. Hypertension - Advised to keep log given medication changes as above.  Medication Adjustments/Labs and Tests Ordered: Current medicines are reviewed at length with the patient today.  Concerns regarding medicines are outlined above.  Medication changes, Labs and Tests ordered today are listed in the Patient Instructions below. Patient Instructions  Medication Instructions:  STOP Lasix (Furosemide) STOP Potassium  START Hydrochlorothiazide 25 mg Take 1 tablet once a day   Labwork: None  Testing/Procedures: CT is Ordered Please Schedule   Follow-Up: Your physician recommends that you schedule a follow-up appointment in: 2 months with Dr Meda Coffee   Any Other Special Instructions Will Be Listed Below (If Applicable).  PLEASE CALL THE OFFICE NEXT WEEK WITH HOW YOU ARE FEELING AND BLOOD PRESSURE READINGS  Check your blood pressure twice a day  We have not received the results back from your monitor, we will contact you once we receive them  If you need a refill on your cardiac  medications  before your next appointment, please call your pharmacy.     Amber [name], [place]  10/25/2016 9:32 AM    [facility] [place], [place], [place]  [address] Phone: [phone]; Fax: [phone]

## 2016-10-25 ENCOUNTER — Ambulatory Visit (INDEPENDENT_AMBULATORY_CARE_PROVIDER_SITE_OTHER): Payer: PRIVATE HEALTH INSURANCE | Admitting: Physician Assistant

## 2016-10-25 ENCOUNTER — Encounter: Payer: Self-pay | Admitting: Physician Assistant

## 2016-10-25 VITALS — BP 112/82 | HR 69 | Resp 16 | Ht 60.0 in | Wt 216.1 lb

## 2016-10-25 DIAGNOSIS — R002 Palpitations: Secondary | ICD-10-CM

## 2016-10-25 DIAGNOSIS — I5032 Chronic diastolic (congestive) heart failure: Secondary | ICD-10-CM

## 2016-10-25 DIAGNOSIS — I10 Essential (primary) hypertension: Secondary | ICD-10-CM | POA: Diagnosis not present

## 2016-10-25 DIAGNOSIS — R0609 Other forms of dyspnea: Secondary | ICD-10-CM | POA: Diagnosis not present

## 2016-10-25 MED ORDER — HYDROCHLOROTHIAZIDE 25 MG PO TABS: 25.0000 mg | ORAL_TABLET | Freq: Every day | ORAL | 3 refills | Status: DC

## 2016-10-25 NOTE — Patient Instructions (Signed)
Medication Instructions:  STOP Lasix (Furosemide) STOP Potassium  START Hydrochlorothiazide 25 mg Take 1 tablet once a day   Labwork: None  Testing/Procedures: CT is Ordered Please Schedule   Follow-Up: Your physician recommends that you schedule a follow-up appointment in: 2 months with Dr Meda Coffee   Any Other Special Instructions Will Be Listed Below (If Applicable).  PLEASE CALL THE OFFICE NEXT WEEK WITH HOW YOU ARE FEELING AND BLOOD PRESSURE READINGS  Check your blood pressure twice a day  We have not received the results back from your monitor, we will contact you once we receive them  If you need a refill on your cardiac medications before your next appointment, please call your pharmacy.

## 2016-11-02 ENCOUNTER — Encounter: Payer: Self-pay | Admitting: *Deleted

## 2016-11-20 ENCOUNTER — Ambulatory Visit (HOSPITAL_COMMUNITY)
Admission: RE | Admit: 2016-11-20 | Discharge: 2016-11-20 | Disposition: A | Discharge status: Home / Self Care | Payer: PRIVATE HEALTH INSURANCE | Source: Ambulatory Visit | Attending: Physician Assistant | Admitting: Physician Assistant

## 2016-11-20 DIAGNOSIS — R002 Palpitations: Secondary | ICD-10-CM | POA: Insufficient documentation

## 2016-11-20 DIAGNOSIS — R0609 Other forms of dyspnea: Secondary | ICD-10-CM | POA: Diagnosis present

## 2016-11-20 DIAGNOSIS — R0789 Other chest pain: Secondary | ICD-10-CM | POA: Insufficient documentation

## 2016-11-20 DIAGNOSIS — M47814 Spondylosis without myelopathy or radiculopathy, thoracic region: Secondary | ICD-10-CM | POA: Diagnosis not present

## 2016-11-20 DIAGNOSIS — R079 Chest pain, unspecified: Secondary | ICD-10-CM | POA: Diagnosis not present

## 2016-11-20 MED ORDER — IOPAMIDOL (ISOVUE-370) INJECTION 76%
INTRAVENOUS | Status: AC
  Administered 2016-11-20: 80 mL
  Filled 2016-11-20: qty 100

## 2016-11-20 MED ORDER — NITROGLYCERIN 0.4 MG SL SUBL
SUBLINGUAL_TABLET | SUBLINGUAL | Status: AC
  Filled 2016-11-20: qty 2

## 2016-11-20 MED ORDER — METOPROLOL TARTRATE 5 MG/5ML IV SOLN
5.0000 mg | Freq: Once | INTRAVENOUS | Status: AC
  Administered 2016-11-20: 5 mg via INTRAVENOUS
  Filled 2016-11-20: qty 5

## 2016-11-20 MED ORDER — METOPROLOL TARTRATE 5 MG/5ML IV SOLN
INTRAVENOUS | Status: AC
  Filled 2016-11-20: qty 5

## 2016-11-20 MED ORDER — NITROGLYCERIN 0.4 MG SL SUBL
0.8000 mg | SUBLINGUAL_TABLET | Freq: Once | SUBLINGUAL | Status: AC
  Administered 2016-11-20: 0.8 mg via SUBLINGUAL
  Filled 2016-11-20: qty 25

## 2016-11-21 ENCOUNTER — Telehealth: Payer: Self-pay | Admitting: Physician Assistant

## 2016-11-21 MED ORDER — ATORVASTATIN CALCIUM 40 MG PO TABS: 40.0000 mg | ORAL_TABLET | Freq: Every day | ORAL | 3 refills | Status: DC

## 2016-11-21 NOTE — Telephone Encounter (Signed)
New message    Patient calling for ct results. Please call

## 2016-11-21 NOTE — Telephone Encounter (Signed)
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 Plan to treat with medical therapy.  09/06/2016: Cholesterol 131; HDL 49.80; LDL Cholesterol 71; Triglycerides 53.0; VLDL 10.6 Start Lipitor 20mg  qd. Follow up with Dr. Meda Coffee as schedule.

## 2017-01-16 ENCOUNTER — Encounter: Payer: Self-pay | Admitting: Cardiology

## 2017-01-16 ENCOUNTER — Ambulatory Visit: Payer: PRIVATE HEALTH INSURANCE | Admitting: Cardiology

## 2017-01-16 VITALS — BP 148/76 | HR 62 | Ht 60.0 in | Wt 213.8 lb

## 2017-01-16 DIAGNOSIS — R002 Palpitations: Secondary | ICD-10-CM

## 2017-01-16 DIAGNOSIS — M5410 Radiculopathy, site unspecified: Secondary | ICD-10-CM

## 2017-01-16 DIAGNOSIS — I10 Essential (primary) hypertension: Secondary | ICD-10-CM

## 2017-01-16 MED ORDER — DILTIAZEM HCL ER COATED BEADS 120 MG PO CP24: 120.0000 mg | ORAL_CAPSULE | Freq: Every day | ORAL | 3 refills | Status: DC

## 2017-01-16 NOTE — Progress Notes (Signed)
Cardiology Office Note    Date:  01/16/2017   ID:  Amber [name], DOB 20-Nov-1960, MRN [id number]  PCP:  [name], NP  Cardiologist: Dr. Meda Coffee  Chief Complaint: Palpitation follow up  History of Present Illness:   Amber [name] is a 57 y.o. female female iron deficiency anemia due to menorrhagia and uterine fibroids (follow by Dr. Alen Blew), leukocytopenia ( no dysplasia or infiltrative bone marrow process on bone marrow biopsy 09/09/13), RA and HTN recently seen by me for chest pain and palpitation here for follow up.   Low risk stress test 10/2012. Seen by me few weeks ago for palpitations x 5-6 months (worsen). She also complains of intermittent chest tightness with dyspnea with and without exertion. Her symptoms relieves with rest. Also required nitro. Plan to get monitor, echo and CT coronary. Increased Toprol XL due to elevated BP.   Normal LV function of 25-63%, grade 2 diastolic dysfunction and mild pulmonary hypertension. Pending CT coronary and final result of monitor.  Here today for further discussion. Patient continues to have daily palpitation lasting for 1 minute. She continues to have dyspnea on exertion, released with rest. Her chest pain has resolved without any intervention and did not require any nitroglycerin since last office visit. Intermittent lower extremity edema, orthopnea.  01/16/2017 - 3 months follow-up, the patient continues to have palpitations, they are not associated with dizziness or syncope. She doesn't have chest pain or shortness of breath but has developed numbness in her left arm that is worse at night. She also has neck pain and headaches.   Past Medical History:  Diagnosis Date  . Anemia   . Arthritis   . Blood transfusion without reported diagnosis   . Colon polyps   . Ectopic pregnancy    RIGHT salpingostomy  . Elective abortion   . GERD (gastroesophageal reflux disease)   . Glaucoma    both eyes  . Headache    migraines  . History of chicken pox   . Hypertension   . Rheumatoid arthritis(714.0) 11/07/2011  . SAB (spontaneous abortion)   . Sjogren's disease [facility])     Past Surgical History:  Procedure Laterality Date  . ABDOMINAL SURGERY     bowel repair  . COLONOSCOPY    . DILATION AND CURETTAGE OF UTERUS     X 2  . MENISCUS REPAIR Right    MENISCUS TEAR REPAIR  . MYOMECTOMY N/A 07/27/2014   Procedure: ABDOMINAL MYOMECTOMY;  Surgeon: Waymon Amato, MD;  Location: [facility];  Service: Gynecology;  Laterality: N/A;  . PELVIC LAPAROSCOPY  1994   IN 1999 LAPAROSCOPY WITH INCIDENTAL ENTEROTOMY REQUIRING LAPAROTOMY  . UTERINE FIBROID SURGERY     July 2016    Current Medications: Prior to Admission medications   Medication Sig Start Date End Date Taking? Authorizing Provider  aspirin EC 81 MG tablet Take 1 tablet (81 mg total) by mouth daily. 09/06/16   Mosie Lukes, MD  Cyanocobalamin (VITAMIN B-12) 5000 MCG LOZG Take 1 tablet by mouth 2 (two) times daily. 11/04/15   [provider]  ferrous sulfate 325 (65 FE) MG tablet Take 325 mg by mouth 3 (three) times daily with meals.     [provider]  furosemide (LASIX) 20 MG tablet Take 1 tablet (20 mg total) by mouth daily as needed for fluid. 12/09/15   [name], NP  hydroxychloroquine (PLAQUENIL) 200 MG tablet Take 200 mg by mouth 2 (two) times daily.  [provider]  lisinopril (PRINIVIL,ZESTRIL) 40 MG tablet Take 1 tablet (40 mg total) by mouth daily. 05/14/16   [name], NP  methocarbamol (ROBAXIN) 500 MG tablet Take 1 tablet (500 mg total) by mouth every 8 (eight) hours as needed. 01/17/16   Hudnall, Sharyn Lull, MD  metoprolol succinate (TOPROL XL) 50 MG 24 hr tablet Take 1 tablet (50 mg total) by mouth daily. Take with or immediately following a meal. 10/03/16   Bhagat, Bhavinkumar, PA  metoprolol succinate (TOPROL-XL) 100 MG 24 hr tablet Take 1 tablet (100 mg total) by mouth daily. Take with or  immediately following a meal. 10/03/16   Bhagat, Bhavinkumar, PA  nitroGLYCERIN (NITROSTAT) 0.4 MG SL tablet Place 1 tablet (0.4 mg total) under the tongue every 5 (five) minutes as needed for chest pain. 09/06/16   Mosie Lukes, MD  potassium chloride (K-DUR) 10 MEQ tablet Take 2 tablets (20 mEq total) by mouth daily. On the days you take lasix 12/09/15 12/20/16  [name], NP  SF 5000 PLUS 1.1 % CREA dental cream As directed. 08/29/16   [provider]  SUMAtriptan (IMITREX) 50 MG tablet TAKE ONE TABLET BY MOUTH EVERY 2 HOURS AS NEEDED FOR  MIGRAINE  MAY  REPEAT  IN  2  HOURS  IF  HEADACHE  PERSISTS  OR  RECURS 08/03/16   [name], NP  TRAVATAN Z 0.004 % SOLN ophthalmic solution Place 1 drop into both eyes at bedtime.  10/25/11   [provider]    Allergies:   Patient has no known allergies.   Social History   Socioeconomic History  . Marital status: Single    Spouse name: None  . Number of children: 0  . Years of education: None  . Highest education level: None  Social Needs  . Financial resource strain: None  . Food insecurity - worry: None  . Food insecurity - inability: None  . Transportation needs - medical: None  . Transportation needs - non-medical: None  Occupational History  . Occupation: LPN    Employer: [employer]  Tobacco Use  . Smoking status: Never Smoker  . Smokeless tobacco: Never Used  Substance and Sexual Activity  . Alcohol use: No    Alcohol/week: 0.0 oz  . Drug use: No  . Sexual activity: Yes    Birth control/protection: None    Comment: 1st intercourse- 17, partners- 5  Other Topics Concern  . 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  Had a sister up in [place] who passed away 03-17-2015 who she was close to.   Born [place]- she came to [place]  as adult.  Age 5.            Family History:  The patient's family history includes Cancer in her sister; Diabetes in her sister and sister; Esophageal cancer in her sister; Heart disease in her mother; Hypertension in her mother and sister; Stomach cancer in her sister.   ROS:   Please see the history of present illness.    ROS All other systems reviewed and are negative.   PHYSICAL EXAM:   VS:  BP (!) 148/76   Pulse 62   Ht 5' (1.524 m)   Wt 213 lb 12.8 oz (97  kg)   SpO2 98%   BMI 41.75 kg/m    GEN: Well nourished, well developed, in no acute distress  HEENT: normal  Neck: no JVD, carotid bruits, or masses Cardiac: RRR; no murmurs, rubs, or gallops,no edema  Respiratory:  clear to auscultation bilaterally, normal work of breathing GI: soft, nontender, nondistended, + BS MS: no deformity or atrophy  Skin: warm and dry, no rash Neuro:  Alert and Oriented x 3, Strength and sensation are intact Psych: euthymic mood, full affect  Wt Readings from Last 3 Encounters:  01/16/17 213 lb 12.8 oz (97 kg)  10/25/16 216 lb 1.9 oz (98 kg)  10/03/16 212 lb (96.2 kg)      Studies/Labs Reviewed:   EKG:  EKG is not ordered today.    Recent Labs: 09/06/2016: ALT 26; TSH 1.13 09/13/2016: HGB 12.4; Platelets 194 10/03/2016: BUN 10; Creatinine, Ser 0.72; NT-Pro BNP 24; Potassium 4.2; Sodium 140   Lipid Panel    Component Value Date/Time   CHOL 131 09/06/2016 1029   TRIG 53.0 09/06/2016 1029   HDL 49.80 09/06/2016 1029   CHOLHDL 3 09/06/2016 1029   VLDL 10.6 09/06/2016 1029   LDLCALC 71 09/06/2016 1029    Additional studies/ records that were reviewed today include:   Echocardiogram: 10/18/16 Study Conclusions  - Left ventricle: The cavity size was normal. There was mild   concentric hypertrophy. Systolic function was normal. The   estimated ejection fraction was in the range of 55% to 60%. Wall   motion was normal; there were no regional wall motion   abnormalities.  Features are consistent with a pseudonormal left   ventricular filling pattern, with concomitant abnormal relaxation   and increased filling pressure (grade 2 diastolic dysfunction).   Doppler parameters are consistent with indeterminate ventricular   filling pressure. - Aortic valve: Transvalvular velocity was within the normal range.   There was no stenosis. There was no regurgitation. - Mitral valve: Transvalvular velocity was within the normal range.   There was no evidence for stenosis. There was no regurgitation. - Left atrium: The atrium was moderately dilated. - Right ventricle: The cavity size was normal. Wall thickness was   normal. Systolic function was normal. - Atrial septum: No defect or patent foramen ovale was identified. - Tricuspid valve: There was trivial regurgitation. - Pulmonary arteries: Systolic pressure was mildly increased. PA   peak pressure: 39 mm Hg (S).  Holter monitor 10/18/2016  Sinus bradycardia to sinus tachycardia.  Frequent PACs (3500 in 48 H), few PVCs.  No runs.   Frequent PACs (3500 in 48 H), few PVCs. No other arrhythmias. Continue betablockers.  CT coronary 11/20/2016 IMPRESSION: 1. Coronary calcium score of 22. This was 95 percentile for age and sex matched control.  2. Normal coronary origin with right dominance.  3. No evidence of CAD. 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 Medical therapy with beta-blockers and statins is indicated.  4. Dilated pulmonary artery measuring 34 mm suggestive of pulmonary hypertension.   ASSESSMENT & PLAN:    1. Chronic diastolic heart failure - Currently euvolemic. She actually lost 3 pounds with hydrochlorothiazide and appears euvolemic.  2. Palpitation - Continue Toprol-XL 100 mg a.m. and 50 mg p.m. Holter monitor showed 3500 PACs in 24 hours. We will add Cardizem CD 120 mg daily.  3. Dyspnea on exertion - CT coronary showed a mild CAD. We will  continue aggressive medical management.  4. Hypertension - Uncontrolled,  add Cardizem CD 120 mg daily.  Medication Adjustments/Labs and Tests Ordered: Current medicines are reviewed at length with the patient today.  Concerns regarding medicines are outlined above.  Medication changes, Labs and Tests ordered today are listed in the Patient Instructions below. There are no Patient Instructions on file for this visit.   Signed, Ena Dawley, MD  01/16/2017 10:14 AM    [address], [place], [place]  [address] Phone: [phone]; Fax: [phone]

## 2017-01-16 NOTE — Patient Instructions (Signed)
Medication Instructions:   START TAKING CARDIZEM CD 120 MG ONCE DAILY     You have been referred to NEUROSURGERY FOR RADICULOPATHY     Follow-Up:  3 MONTHS WITH DR Meda Coffee       If you need a refill on your cardiac medications before your next appointment, please call your pharmacy.

## 2017-01-17 ENCOUNTER — Ambulatory Visit: Payer: PRIVATE HEALTH INSURANCE | Admitting: Medical

## 2017-01-17 DIAGNOSIS — Z0289 Encounter for other administrative examinations: Secondary | ICD-10-CM

## 2017-01-18 ENCOUNTER — Ambulatory Visit: Payer: PRIVATE HEALTH INSURANCE | Admitting: Family

## 2017-01-18 ENCOUNTER — Encounter: Payer: Self-pay | Admitting: Family

## 2017-01-18 VITALS — BP 138/82 | HR 68 | Temp 99.0°F | Resp 16 | Ht 61.0 in | Wt 210.0 lb

## 2017-01-18 DIAGNOSIS — J020 Streptococcal pharyngitis: Secondary | ICD-10-CM

## 2017-01-18 DIAGNOSIS — J029 Acute pharyngitis, unspecified: Secondary | ICD-10-CM

## 2017-01-18 DIAGNOSIS — R52 Pain, unspecified: Secondary | ICD-10-CM | POA: Diagnosis not present

## 2017-01-18 LAB — POCT RAPID STREP A (OFFICE): Rapid Strep A Screen: POSITIVE — AB

## 2017-01-18 LAB — POCT INFLUENZA A/B: Influenza A, POC: NEGATIVE

## 2017-01-18 MED ORDER — SUMATRIPTAN SUCCINATE 50 MG PO TABS: ORAL_TABLET | ORAL | 5 refills | Status: DC

## 2017-01-18 MED ORDER — AMOXICILLIN 500 MG PO CAPS: 500.0000 mg | ORAL_CAPSULE | Freq: Three times a day (TID) | ORAL | 0 refills | Status: DC

## 2017-01-18 MED ORDER — ONDANSETRON HCL 4 MG PO TABS: 4.0000 mg | ORAL_TABLET | Freq: Three times a day (TID) | ORAL | 0 refills | Status: DC | PRN

## 2017-01-18 NOTE — Progress Notes (Signed)
Subjective:    Patient ID: Amber [name], female    DOB: May 14, 1960, 57 y.o.   MRN: [id number]  HPI  Amber [name] is a 57 yr old female who presents today with c/o congestion.  She reports that symptoms began 3 days ago. Has cough, left ear pain, nausea/vomiting and fever.  She reports maximum fever of 101.2.  She has not had a flu shot this season.  Review of Systems See HPI  Past Medical History:  Diagnosis Date  . Anemia   . Arthritis   . Blood transfusion without reported diagnosis   . Colon polyps   . Ectopic pregnancy    RIGHT salpingostomy  . Elective abortion   . GERD (gastroesophageal reflux disease)   . Glaucoma    both eyes  . Headache    migraines  . History of chicken pox   . Hypertension   . Rheumatoid arthritis(714.0) 11/07/2011  . SAB (spontaneous abortion)   . Sjogren's disease ([facility])      Social History   Socioeconomic History  . Marital status: Single    Spouse name: Not on file  . Number of children: 0  . Years of education: Not on file  . Highest education level: Not on file  Social Needs  . Financial resource strain: Not on file  . Food insecurity - worry: Not on file  . Food insecurity - inability: Not on file  . Transportation needs - medical: Not on file  . Transportation needs - non-medical: Not on file  Occupational History  . Occupation: LPN    Employer: [employer]  Tobacco Use  . Smoking status: Never Smoker  . Smokeless tobacco: Never Used  Substance and Sexual Activity  . Alcohol use: No    Alcohol/week: 0.0 oz  . Drug use: No  . Sexual activity: Yes    Birth control/protection: None    Comment: 1st intercourse- 17, partners- 5  Other Topics Concern  . 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  Had a sister up in [place] who passed  away 2017 who she was close to.   Born in [place]- she came to [place] as adult.  Age 17.           Past Surgical History:  Procedure Laterality Date  . ABDOMINAL SURGERY     bowel repair  . COLONOSCOPY    . DILATION AND CURETTAGE OF UTERUS     X 2  . MENISCUS REPAIR Right    MENISCUS TEAR REPAIR  . MYOMECTOMY N/A 07/27/2014   Procedure: ABDOMINAL MYOMECTOMY;  Surgeon: Waymon Amato, MD;  Location: [facility];  Service: Gynecology;  Laterality: N/A;  . PELVIC LAPAROSCOPY  1994   IN 1999 LAPAROSCOPY WITH INCIDENTAL ENTEROTOMY REQUIRING LAPAROTOMY  . UTERINE FIBROID SURGERY     July 2016    Family History  Problem Relation Age of Onset  . Hypertension Mother        died from heart disease  . Heart disease Mother        deceased, unknown cause  . Diabetes Sister   . Hypertension Sister   . Esophageal cancer Sister   . Stomach cancer Sister   . Cancer Sister   . Diabetes Sister   .  Rectal cancer Neg Hx   . Colon cancer Neg Hx     No Known Allergies  Current Outpatient Medications on File Prior to Visit  Medication Sig Dispense Refill  . aspirin EC 81 MG tablet Take 1 tablet (81 mg total) by mouth daily.    Marland Kitchen atorvastatin (LIPITOR) 40 MG tablet Take 1 tablet (40 mg total) by mouth daily. 90 tablet 3  . Cyanocobalamin (VITAMIN B-12) 5000 MCG LOZG Take 1 tablet by mouth 2 (two) times daily.    Marland Kitchen diltiazem (CARDIZEM CD) 120 MG 24 hr capsule Take 1 capsule (120 mg total) by mouth daily. 90 capsule 3  . ferrous sulfate 325 (65 FE) MG tablet Take 325 mg by mouth 3 (three) times daily with meals.     . hydrochlorothiazide (HYDRODIURIL) 25 MG tablet Take 1 tablet (25 mg total) by mouth daily. 30 tablet 3  . lisinopril (PRINIVIL,ZESTRIL) 40 MG tablet Take 1 tablet (40 mg total) by mouth daily. 30 tablet 3  . metoprolol succinate (TOPROL XL) 50 MG 24 hr tablet Take 1 tablet (50 mg total) by mouth daily. Take with or immediately following a meal. 30 tablet 2  . metoprolol succinate (TOPROL-XL)  100 MG 24 hr tablet Take 1 tablet (100 mg total) by mouth daily. Take with or immediately following a meal. 30 tablet 2  . nitroGLYCERIN (NITROSTAT) 0.4 MG SL tablet Place 1 tablet (0.4 mg total) under the tongue every 5 (five) minutes as needed for chest pain. 25 tablet 1  . SF 5000 PLUS 1.1 % CREA dental cream As directed.    . SUMAtriptan (IMITREX) 50 MG tablet TAKE ONE TABLET BY MOUTH EVERY 2 HOURS AS NEEDED FOR  MIGRAINE  MAY  REPEAT  IN  2  HOURS  IF  HEADACHE  PERSISTS  OR  RECURS 9 tablet 1  . TRAVATAN Z 0.004 % SOLN ophthalmic solution Place 1 drop into both eyes at bedtime.      No current facility-administered medications on file prior to visit.     BP 138/82 (BP Location: Right Arm, Patient Position: Sitting, Cuff Size: Large)   Pulse 68   Temp 99 F (37.2 C) (Oral)   Resp 16   Ht 5\' 1"  (1.549 m)   Wt 210 lb (95.3 kg)   SpO2 100%   BMI 39.68 kg/m       Objective:   Physical Exam  Constitutional: She is oriented to person, place, and time. She appears well-developed and well-nourished.  HENT:  Head: Normocephalic and atraumatic.  Mouth/Throat: No oropharyngeal exudate or posterior oropharyngeal edema.  Petechia noted on soft palate  Cardiovascular: Normal rate, regular rhythm and normal heart sounds.  No murmur heard. Pulmonary/Chest: Effort normal and breath sounds normal. No respiratory distress. She has no wheezes.  Musculoskeletal: She exhibits no edema.  Neurological: She is alert and oriented to person, place, and time.  Psychiatric: She has a normal mood and affect. Her behavior is normal. Judgment and thought content normal.          Assessment & Plan:  Strep pharyngitis- rapid strep +.  Rapid flu swab is negative.  Will rx with amoxicillin.  Pt is advised as follows:    Please begin amoxicillin for strep throat.  You may use tylenol as needed for pain. Call if new/worsening symptoms or if symptoms are not improved in 2-3 days.

## 2017-01-18 NOTE — Patient Instructions (Addendum)
Please begin amoxicillin for strep throat.  You may use tylenol as needed for pain. Call if new/worsening symptoms or if symptoms are not improved in 2-3 days.    Strep Throat Strep throat is an infection of the throat. It is caused by germs. Strep throat spreads from person to person because of coughing, sneezing, or close contact. Follow these instructions at home: Medicines  Take over-the-counter and prescription medicines only as told by your doctor.  Take your antibiotic medicine as told by your doctor. Do not stop taking the medicine even if you feel better.  Have family members who also have a sore throat or fever go to a doctor. Eating and drinking  Do not share food, drinking cups, or personal items.  Try eating soft foods until your sore throat feels better.  Drink enough fluid to keep your pee (urine) clear or pale yellow. General instructions  Rinse your mouth (gargle) with a salt-water mixture 3-4 times per day or as needed. To make a salt-water mixture, stir -1 tsp of salt into 1 cup of warm water.  Make sure that all people in your house wash their hands well.  Rest.  Stay home from school or work until you have been taking antibiotics for 24 hours.  Keep all follow-up visits as told by your doctor. This is important. Contact a doctor if:  Your neck keeps getting bigger.  You get a rash, cough, or earache.  You cough up thick liquid that is green, yellow-brown, or bloody.  You have pain that does not get better with medicine.  Your problems get worse instead of getting better.  You have a fever. Get help right away if:  You throw up (vomit).  You get a very bad headache.  You neck hurts or it feels stiff.  You have chest pain or you are short of breath.  You have drooling, very bad throat pain, or changes in your voice.  Your neck is swollen or the skin gets red and tender.  Your mouth is dry or you are peeing less than normal.  You keep  feeling more tired or it is hard to wake up.  Your joints are red or they hurt. This information is not intended to replace advice given to you by your health care provider. Make sure you discuss any questions you have with your health care provider. Document Released: 06/06/2007 Document Revised: 08/17/2015 Document Reviewed: 04/12/2014 Elsevier Interactive Patient Education  [employer].

## 2017-01-21 ENCOUNTER — Other Ambulatory Visit: Payer: Self-pay | Admitting: Family

## 2017-01-22 ENCOUNTER — Other Ambulatory Visit: Payer: Self-pay

## 2017-01-22 MED ORDER — HYDROCHLOROTHIAZIDE 25 MG PO TABS: 25.0000 mg | ORAL_TABLET | Freq: Every day | ORAL | 3 refills | Status: DC

## 2017-02-01 ENCOUNTER — Ambulatory Visit (HOSPITAL_BASED_OUTPATIENT_CLINIC_OR_DEPARTMENT_OTHER)
Admission: RE | Admit: 2017-02-01 | Discharge: 2017-02-01 | Disposition: A | Discharge status: Home / Self Care | Payer: PRIVATE HEALTH INSURANCE | Source: Ambulatory Visit | Attending: Family | Admitting: Family

## 2017-02-01 ENCOUNTER — Encounter: Payer: Self-pay | Admitting: Family

## 2017-02-01 ENCOUNTER — Ambulatory Visit: Payer: PRIVATE HEALTH INSURANCE | Admitting: Family

## 2017-02-01 VITALS — BP 151/76 | HR 78 | Temp 98.9°F | Resp 16 | Ht 61.0 in | Wt 209.8 lb

## 2017-02-01 DIAGNOSIS — H6690 Otitis media, unspecified, unspecified ear: Secondary | ICD-10-CM

## 2017-02-01 DIAGNOSIS — R05 Cough: Secondary | ICD-10-CM

## 2017-02-01 DIAGNOSIS — R059 Cough, unspecified: Secondary | ICD-10-CM

## 2017-02-01 DIAGNOSIS — J209 Acute bronchitis, unspecified: Secondary | ICD-10-CM | POA: Diagnosis not present

## 2017-02-01 MED ORDER — AZITHROMYCIN 250 MG PO TABS: ORAL_TABLET | ORAL | 0 refills | Status: DC

## 2017-02-01 MED ORDER — ALBUTEROL SULFATE HFA 108 (90 BASE) MCG/ACT IN AERS: 2.0000 | INHALATION_SPRAY | Freq: Four times a day (QID) | RESPIRATORY_TRACT | 0 refills | Status: DC | PRN

## 2017-02-01 MED ORDER — PREDNISONE 10 MG PO TABS: ORAL_TABLET | ORAL | 0 refills | Status: DC

## 2017-02-01 NOTE — Progress Notes (Signed)
Subjective:    Patient ID: Amber [name], female    DOB: February 09, 1960, 57 y.o.   MRN: [id number]  HPI   Amber [name] is a 57 yr old female who presents today with chief compliant of cough, myalgia, and fever. Symptoms have been present x 1 week.  Tmax 101.2.  Was treated with amoxicillin for strep on 01/18/17. Reports no sore throat but has "cough and sinus congestion."    Review of Systems See HPI  Past Medical History:  Diagnosis Date  . Anemia   . Arthritis   . Blood transfusion without reported diagnosis   . Colon polyps   . Ectopic pregnancy    RIGHT salpingostomy  . Elective abortion   . GERD (gastroesophageal reflux disease)   . Glaucoma    both eyes  . Headache    migraines  . History of chicken pox   . Hypertension   . Rheumatoid arthritis(714.0) 11/07/2011  . SAB (spontaneous abortion)   . Sjogren's disease ([place])      Social History   Socioeconomic History  . Marital status: Single    Spouse name: Not on file  . Number of children: 0  . Years of education: Not on file  . Highest education level: Not on file  Social Needs  . Financial resource strain: Not on file  . Food insecurity - worry: Not on file  . Food insecurity - inability: Not on file  . Transportation needs - medical: Not on file  . Transportation needs - non-medical: Not on file  Occupational History  . Occupation: LPN    Employer: [employer]  Tobacco Use  . Smoking status: Never Smoker  . Smokeless tobacco: Never Used  Substance and Sexual Activity  . Alcohol use: No    Alcohol/week: 0.0 oz  . Drug use: No  . Sexual activity: Yes    Birth control/protection: None    Comment: 1st intercourse- 17, partners- 5  Other Topics Concern  . 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  Had a sister up in  [place] who passed away April 02, 2015 who she was close to.   Born [place]- she came to [place] as adult.  Age 38.           Past Surgical History:  Procedure Laterality Date  . ABDOMINAL SURGERY     bowel repair  . COLONOSCOPY    . DILATION AND CURETTAGE OF UTERUS     X 2  . MENISCUS REPAIR Right    MENISCUS TEAR REPAIR  . MYOMECTOMY N/A 07/27/2014   Procedure: ABDOMINAL MYOMECTOMY;  Surgeon: Waymon Amato, MD;  Location: [facility];  Service: Gynecology;  Laterality: N/A;  . PELVIC LAPAROSCOPY  1994   IN 1999 LAPAROSCOPY WITH INCIDENTAL ENTEROTOMY REQUIRING LAPAROTOMY  . UTERINE FIBROID SURGERY     July 2016    Family History  Problem Relation Age of Onset  . Hypertension Mother        died from heart disease  . Heart disease Mother        deceased, unknown cause  . Diabetes Sister   . Hypertension Sister   . Esophageal cancer Sister   . Stomach cancer Sister   . Cancer Sister   . Diabetes  Sister   . Rectal cancer Neg Hx   . Colon cancer Neg Hx     No Known Allergies  Current Outpatient Medications on File Prior to Visit  Medication Sig Dispense Refill  . aspirin EC 81 MG tablet Take 1 tablet (81 mg total) by mouth daily.    Marland Kitchen atorvastatin (LIPITOR) 40 MG tablet Take 1 tablet (40 mg total) by mouth daily. 90 tablet 3  . Cyanocobalamin (VITAMIN B-12) 5000 MCG LOZG Take 1 tablet by mouth 2 (two) times daily.    Marland Kitchen diltiazem (CARDIZEM CD) 120 MG 24 hr capsule Take 1 capsule (120 mg total) by mouth daily. 90 capsule 3  . ferrous sulfate 325 (65 FE) MG tablet Take 325 mg by mouth 3 (three) times daily with meals.     . furosemide (LASIX) 20 MG tablet TAKE ONE TABLET BY MOUTH ONCE DAILY AS NEEDED FOR  FLUID 90 tablet 1  . hydrochlorothiazide (HYDRODIURIL) 25 MG tablet Take 1 tablet (25 mg total) by mouth daily. 30 tablet 3  . lisinopril (PRINIVIL,ZESTRIL) 40 MG tablet Take 1 tablet (40 mg total) by mouth daily. 30 tablet 3  . metoprolol succinate (TOPROL XL) 50 MG 24 hr tablet Take 1 tablet  (50 mg total) by mouth daily. Take with or immediately following a meal. 30 tablet 2  . metoprolol succinate (TOPROL-XL) 100 MG 24 hr tablet Take 1 tablet (100 mg total) by mouth daily. Take with or immediately following a meal. 30 tablet 2  . nitroGLYCERIN (NITROSTAT) 0.4 MG SL tablet Place 1 tablet (0.4 mg total) under the tongue every 5 (five) minutes as needed for chest pain. 25 tablet 1  . ondansetron (ZOFRAN) 4 MG tablet Take 1 tablet (4 mg total) by mouth every 8 (eight) hours as needed for nausea or vomiting. 20 tablet 0  . SF 5000 PLUS 1.1 % CREA dental cream As directed.    . SUMAtriptan (IMITREX) 50 MG tablet TAKE ONE TABLET BY MOUTH EVERY 2 HOURS AS NEEDED FOR  MIGRAINE  MAY  REPEAT  IN  2  HOURS  IF  HEADACHE  PERSISTS  OR  RECURS 9 tablet 5  . TRAVATAN Z 0.004 % SOLN ophthalmic solution Place 1 drop into both eyes at bedtime.      No current facility-administered medications on file prior to visit.     BP (!) 151/76 (BP Location: Right Arm, Patient Position: Sitting, Cuff Size: Large)   Pulse 78   Temp 98.9 F (37.2 C) (Oral)   Resp 16   Ht 5\' 1"  (1.549 m)   Wt 209 lb 12.8 oz (95.2 kg)   SpO2 100%   BMI 39.64 kg/m       Objective:   Physical Exam  Constitutional: She appears well-developed and well-nourished.  HENT:  Head: Normocephalic and atraumatic.  Right Ear: Tympanic membrane is erythematous.  Left Ear: Tympanic membrane and ear canal normal.  Mild oropharyngeal erythema without swelling or exudates  Cardiovascular: Normal rate, regular rhythm and normal heart sounds.  No murmur heard. Pulmonary/Chest: Effort normal. No respiratory distress. She has wheezes. She has rales.  Psychiatric: She has a normal mood and affect. Her behavior is normal. Judgment and thought content normal.          Assessment & Plan:  R otitis media- rx with zpak  Bronchitis with bronchospasm- rx with pred taper, albuterol, zpak, obtain cxr to rule out PNA. Pt is advised to call  if new/worsening symptoms, if fever >101  or if not improved in 3-4 days.

## 2017-02-01 NOTE — Patient Instructions (Signed)
Please complete chest x ray on the first floor. Begin zpak, prednisone and albuterol every 6 hours as needed.   Call if new/worsening symptoms, if fever >101 or if not improved in 3-4 days.

## 2017-02-08 ENCOUNTER — Encounter: Payer: Self-pay | Admitting: Family

## 2017-02-08 ENCOUNTER — Ambulatory Visit: Payer: PRIVATE HEALTH INSURANCE | Admitting: Family

## 2017-02-08 VITALS — BP 150/77 | HR 76 | Temp 98.5°F | Resp 16 | Ht 61.0 in | Wt 212.0 lb

## 2017-02-08 DIAGNOSIS — J209 Acute bronchitis, unspecified: Secondary | ICD-10-CM | POA: Diagnosis not present

## 2017-02-08 MED ORDER — FLUTICASONE-SALMETEROL 250-50 MCG/DOSE IN AEPB: 1.0000 | INHALATION_SPRAY | Freq: Two times a day (BID) | RESPIRATORY_TRACT | 3 refills | Status: DC

## 2017-02-08 NOTE — Progress Notes (Signed)
Subjective:    Patient ID: Amber [name], female    DOB: 1960-09-18, 57 y.o.   MRN: [id number]  HPI Patient is a 57 year old female who presents today for follow-up of her bronchitis.  She was last seen on February 01, 2017.  She had also a right-sided otitis media that day with significant  wheezing.  She was treated with azithromycin, prednisone, and albuterol.  A chest x-ray was performed and showed no infiltrate.  She is using delsym PRN cough.  Ear pain is improved.   Review of Systems See HPI  Past Medical History:  Diagnosis Date  . Anemia   . Arthritis   . Blood transfusion without reported diagnosis   . Colon polyps   . Ectopic pregnancy    RIGHT salpingostomy  . Elective abortion   . GERD (gastroesophageal reflux disease)   . Glaucoma    both eyes  . Headache    migraines  . History of chicken pox   . Hypertension   . Rheumatoid arthritis(714.0) 11/07/2011  . SAB (spontaneous abortion)   . Sjogren's disease ([place])      Social History   Socioeconomic History  . Marital status: Single    Spouse name: Not on file  . Number of children: 0  . Years of education: Not on file  . Highest education level: Not on file  Social Needs  . Financial resource strain: Not on file  . Food insecurity - worry: Not on file  . Food insecurity - inability: Not on file  . Transportation needs - medical: Not on file  . Transportation needs - non-medical: Not on file  Occupational History  . Occupation: LPN    Employer: [employer]  Tobacco Use  . Smoking status: Never Smoker  . Smokeless tobacco: Never Used  Substance and Sexual Activity  . Alcohol use: No    Alcohol/week: 0.0 oz  . Drug use: No  . Sexual activity: Yes    Birth control/protection: None    Comment: 1st intercourse- 17, partners- 5  Other Topics Concern  . 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  Had a sister up in [place] who passed away 2015-04-17 who she was close to.   Born in [place]- she came to [place] as adult.  Age 7.           Past Surgical History:  Procedure Laterality Date  . ABDOMINAL SURGERY     bowel repair  . COLONOSCOPY    . DILATION AND CURETTAGE OF UTERUS     X 2  . MENISCUS REPAIR Right    MENISCUS TEAR REPAIR  . MYOMECTOMY N/A 07/27/2014   Procedure: ABDOMINAL MYOMECTOMY;  Surgeon: Waymon Amato, MD;  Location: [facility];  Service: Gynecology;  Laterality: N/A;  . PELVIC LAPAROSCOPY  1994   IN 1999 LAPAROSCOPY WITH INCIDENTAL ENTEROTOMY REQUIRING LAPAROTOMY  . UTERINE FIBROID SURGERY     July 2016    Family History  Problem Relation Age of Onset  . Hypertension Mother        died from heart disease  . Heart disease Mother        deceased, unknown cause  . Diabetes Sister   . Hypertension Sister   . Esophageal cancer  Sister   . Stomach cancer Sister   . Cancer Sister   . Diabetes Sister   . Rectal cancer Neg Hx   . Colon cancer Neg Hx     No Known Allergies  Current Outpatient Medications on File Prior to Visit  Medication Sig Dispense Refill  . aspirin EC 81 MG tablet Take 1 tablet (81 mg total) by mouth daily.    Marland Kitchen atorvastatin (LIPITOR) 40 MG tablet Take 1 tablet (40 mg total) by mouth daily. 90 tablet 3  . Cyanocobalamin (VITAMIN B-12) 5000 MCG LOZG Take 1 tablet by mouth 2 (two) times daily.    Marland Kitchen diltiazem (CARDIZEM CD) 120 MG 24 hr capsule Take 1 capsule (120 mg total) by mouth daily. 90 capsule 3  . ferrous sulfate 325 (65 FE) MG tablet Take 325 mg by mouth 3 (three) times daily with meals.     . furosemide (LASIX) 20 MG tablet TAKE ONE TABLET BY MOUTH ONCE DAILY AS NEEDED FOR  FLUID 90 tablet 1  . hydrochlorothiazide (HYDRODIURIL) 25 MG tablet Take 1 tablet (25 mg total) by mouth daily. 30 tablet 3  . lisinopril (PRINIVIL,ZESTRIL) 40 MG tablet Take 1 tablet (40 mg  total) by mouth daily. 30 tablet 3  . metoprolol succinate (TOPROL XL) 50 MG 24 hr tablet Take 1 tablet (50 mg total) by mouth daily. Take with or immediately following a meal. 30 tablet 2  . metoprolol succinate (TOPROL-XL) 100 MG 24 hr tablet Take 1 tablet (100 mg total) by mouth daily. Take with or immediately following a meal. 30 tablet 2  . nitroGLYCERIN (NITROSTAT) 0.4 MG SL tablet Place 1 tablet (0.4 mg total) under the tongue every 5 (five) minutes as needed for chest pain. 25 tablet 1  . ondansetron (ZOFRAN) 4 MG tablet Take 1 tablet (4 mg total) by mouth every 8 (eight) hours as needed for nausea or vomiting. 20 tablet 0  . predniSONE (DELTASONE) 10 MG tablet 4 tabs by mouth once daily x 2 days, then 3 tabs once daily x 2 days, then 2 tabs  Once daily x 2 days then 1 tab daily x 2 days. 21 tablet 0  . SF 5000 PLUS 1.1 % CREA dental cream As directed.    . SUMAtriptan (IMITREX) 50 MG tablet TAKE ONE TABLET BY MOUTH EVERY 2 HOURS AS NEEDED FOR  MIGRAINE  MAY  REPEAT  IN  2  HOURS  IF  HEADACHE  PERSISTS  OR  RECURS 9 tablet 5  . TRAVATAN Z 0.004 % SOLN ophthalmic solution Place 1 drop into both eyes at bedtime.      No current facility-administered medications on file prior to visit.     BP (!) 150/77 (BP Location: Right Arm, Patient Position: Sitting, Cuff Size: Large)   Pulse 76   Temp 98.5 F (36.9 C) (Oral)   Resp 16   Ht 5\' 1"  (1.549 m)   Wt 212 lb (96.2 kg)   LMP 06/01/2016   SpO2 100%   BMI 40.06 kg/m       Objective:   Physical Exam  Constitutional: She is oriented to person, place, and time. She appears well-developed and well-nourished.  HENT:  Head: Normocephalic and atraumatic.  Right Ear: Tympanic membrane and ear canal normal.  Left Ear: Tympanic membrane and ear canal normal.  Mouth/Throat: No oropharyngeal exudate, posterior oropharyngeal edema or posterior oropharyngeal erythema.  Cardiovascular: Normal rate, regular rhythm and normal heart sounds.  No  murmur heard. Pulmonary/Chest:  Effort normal. No respiratory distress. She has wheezes.  Lymphadenopathy:    She has no cervical adenopathy.  Neurological: She is alert and oriented to person, place, and time.  Skin: Skin is warm and dry.  Psychiatric: She has a normal mood and affect. Her behavior is normal. Judgment and thought content normal.          Assessment & Plan:  Bronchitis with bronchospasm-clinically improving.  She continues to wheeze today but it is much less severe than last visit.  She has completed her prednisone.  At this time I have advised her to begin Advair 250-51 puff twice daily and continue albuterol as needed.  She is to call if symptoms worsen or if they do not continue to slowly improve.Patient verbalizes understanding.

## 2017-02-08 NOTE — Patient Instructions (Signed)
Please add advair one puff twice daily. Continue albuterol as needed. You may add mucinex as needed four congestion and continue delsym as neede dfor cough. Continue albuterol every 6 hours as needed.  Call if symptoms worsen or if symptoms do not continue to improve.

## 2017-02-21 ENCOUNTER — Telehealth: Payer: Self-pay | Admitting: *Deleted

## 2017-02-21 NOTE — Telephone Encounter (Signed)
AMB REFERRAL TO NEUROSURGERY  Received: 1 week ago  Message Contents  Conley Simmonds, LPN        Appt made for 02-19-17 with Dr Christella Noa

## 2017-03-05 ENCOUNTER — Inpatient Hospital Stay: Payer: PRIVATE HEALTH INSURANCE

## 2017-03-05 ENCOUNTER — Inpatient Hospital Stay: Payer: PRIVATE HEALTH INSURANCE | Attending: Oncology | Admitting: Oncology

## 2017-03-05 VITALS — BP 157/81 | HR 62 | Temp 98.3°F | Resp 18 | Ht 61.0 in | Wt 214.2 lb

## 2017-03-05 DIAGNOSIS — D72819 Decreased white blood cell count, unspecified: Secondary | ICD-10-CM | POA: Diagnosis not present

## 2017-03-05 DIAGNOSIS — N946 Dysmenorrhea, unspecified: Secondary | ICD-10-CM

## 2017-03-05 DIAGNOSIS — M069 Rheumatoid arthritis, unspecified: Secondary | ICD-10-CM

## 2017-03-05 DIAGNOSIS — N920 Excessive and frequent menstruation with regular cycle: Secondary | ICD-10-CM | POA: Diagnosis not present

## 2017-03-05 DIAGNOSIS — D500 Iron deficiency anemia secondary to blood loss (chronic): Secondary | ICD-10-CM | POA: Insufficient documentation

## 2017-03-05 DIAGNOSIS — D508 Other iron deficiency anemias: Secondary | ICD-10-CM

## 2017-03-05 LAB — CBC WITH DIFFERENTIAL/PLATELET
BASOS ABS: 0 10*3/uL (ref 0.0–0.1)
Basophils Relative: 0 %
EOS PCT: 3 %
Eosinophils Absolute: 0.1 10*3/uL (ref 0.0–0.5)
HCT: 37.1 % (ref 34.8–46.6)
Hemoglobin: 11.5 g/dL — ABNORMAL LOW (ref 11.6–15.9)
LYMPHS PCT: 56 %
Lymphs Abs: 1.8 10*3/uL (ref 0.9–3.3)
MCH: 27.1 pg (ref 25.1–34.0)
MCHC: 31 g/dL — ABNORMAL LOW (ref 31.5–36.0)
MCV: 87.5 fL (ref 79.5–101.0)
Monocytes Absolute: 0.3 10*3/uL (ref 0.1–0.9)
Monocytes Relative: 10 %
Neutro Abs: 1 10*3/uL — ABNORMAL LOW (ref 1.5–6.5)
Neutrophils Relative %: 31 %
PLATELETS: 221 10*3/uL (ref 145–400)
RBC: 4.24 MIL/uL (ref 3.70–5.45)
RDW: 15.2 % — ABNORMAL HIGH (ref 11.2–14.5)
WBC: 3.3 10*3/uL — AB (ref 3.9–10.3)

## 2017-03-05 LAB — IRON AND TIBC
Iron: 57 ug/dL (ref 41–142)
Saturation Ratios: 17 % — ABNORMAL LOW (ref 21–57)
TIBC: 331 ug/dL (ref 236–444)
UIBC: 275 ug/dL

## 2017-03-05 LAB — FERRITIN: FERRITIN: 22 ng/mL (ref 9–269)

## 2017-03-05 NOTE — Progress Notes (Signed)
Hematology and Oncology Follow Up Visit  Amber [name] [id number] [birth year] 57 y.o. 03/05/2017 8:50 AM   Principle Diagnosis: 57 year old woman with:  1.  Iron deficiency anemia due to menorrhagia and uterine fibroids diagnosed in 2007. At that time her iron was 34 and ferritin of 10. 2. 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 Bone marrow biopsy obtained in September 2015 showed no abnormalities.  Prior Therapy:  She is status post IV iron infusion in the form of Feraheme given on multiple occasions and repeated as needed.  Current therapy: Iron sulfate 325 mg by mouth 3 times a day.  Interim History: Amber [name] is here for a follow-up visit. Since the last visit, she reports slightly increased fatigue and tiredness. She is no longer experiencing any menstrual bleeding at this time. She was diagnosed with bronchitis and reactive airway disease in the month of February and currently he has recovered. She continues to take oral iron supplements although she feels she is likely behind on her iron supplements. She denied any dyspnea on exertion, hematochezia or melena.  She does not report any headaches, blurry vision, syncope or seizures. She does not report any fevers, chills or sweats. She has not reported any chest pain or shortness of breath. She does not report any cough, wheezing or hemoptysis. She denied any nausea, vomiting, abdominal pain or distention. She does not report any frequency urgency or hesitancy. She does not report any lymphadenopathy or petechiae. She denied any skin rashes or lesions. Remainder or view of system is negative.  Medications: I have reviewed the patient's current medications.  Current Outpatient Medications  Medication Sig Dispense Refill  . aspirin EC 81 MG tablet Take 1 tablet (81 mg total) by mouth daily.    Marland Kitchen atorvastatin (LIPITOR) 40 MG tablet Take 1 tablet (40 mg total) by mouth daily. 90 tablet 3  .  Cyanocobalamin (VITAMIN B-12) 5000 MCG LOZG Take 1 tablet by mouth 2 (two) times daily.    Marland Kitchen diltiazem (CARDIZEM CD) 120 MG 24 hr capsule Take 1 capsule (120 mg total) by mouth daily. 90 capsule 3  . ferrous sulfate 325 (65 FE) MG tablet Take 325 mg by mouth 3 (three) times daily with meals.     . Fluticasone-Salmeterol (ADVAIR DISKUS) 250-50 MCG/DOSE AEPB Inhale 1 puff into the lungs 2 (two) times daily. 1 each 3  . furosemide (LASIX) 20 MG tablet TAKE ONE TABLET BY MOUTH ONCE DAILY AS NEEDED FOR  FLUID 90 tablet 1  . hydrochlorothiazide (HYDRODIURIL) 25 MG tablet Take 1 tablet (25 mg total) by mouth daily. 30 tablet 3  . lisinopril (PRINIVIL,ZESTRIL) 40 MG tablet Take 1 tablet (40 mg total) by mouth daily. 30 tablet 3  . metoprolol succinate (TOPROL XL) 50 MG 24 hr tablet Take 1 tablet (50 mg total) by mouth daily. Take with or immediately following a meal. 30 tablet 2  . metoprolol succinate (TOPROL-XL) 100 MG 24 hr tablet Take 1 tablet (100 mg total) by mouth daily. Take with or immediately following a meal. 30 tablet 2  . nitroGLYCERIN (NITROSTAT) 0.4 MG SL tablet Place 1 tablet (0.4 mg total) under the tongue every 5 (five) minutes as needed for chest pain. 25 tablet 1  . ondansetron (ZOFRAN) 4 MG tablet Take 1 tablet (4 mg total) by mouth every 8 (eight) hours as needed for nausea or vomiting. 20 tablet 0  . SF 5000 PLUS 1.1 % CREA dental cream As directed.    Marland Kitchen  SUMAtriptan (IMITREX) 50 MG tablet TAKE ONE TABLET BY MOUTH EVERY 2 HOURS AS NEEDED FOR  MIGRAINE  MAY  REPEAT  IN  2  HOURS  IF  HEADACHE  PERSISTS  OR  RECURS 9 tablet 5  . TRAVATAN Z 0.004 % SOLN ophthalmic solution Place 1 drop into both eyes at bedtime.      No current facility-administered medications for this visit.      Allergies: No Known Allergies  Family history, social history and surgical history was reviewed and updated today.   Physical Exam: Blood pressure (!) 157/81, pulse 62, temperature 98.3 F (36.8 C),  temperature source Oral, resp. rate 18, height 5' 1"  (1.549 m), weight 214 lb 3.2 oz (97.2 kg), last menstrual period 06/01/2016, SpO2 100 %.   ECOG: 0 General appearance: Alert, awake woman appeared without distress. Head: Atraumatic without any abnormalities. Eyes: No scleral icterus. Oropharynx without any thrush or ulcers. Lymph nodes: Cervical, supraclavicular, and axillary nodes normal. Heart: Regular rate and rhythm without murmurs or gallops. Lung: Clear to auscultation without any rhonchi, wheezes or falls to percussion. Abdomen: Soft, nontender without any rebound or guarding. No shifting dullness or ascites. Musculoskeletal: No joint deformity or effusion. Skin: No rashes or lesions. Neurological: No motor, sensory deficits.   Lab Results: Lab Results  Component Value Date   WBC 2.5 (L) 09/13/2016   HGB 12.4 09/13/2016   HCT 39.6 09/13/2016   MCV 87.8 09/13/2016   PLT 194 09/13/2016     Chemistry      Component Value Date/Time   NA 140 10/03/2016 1657   NA 138 01/21/2015 0935   K 4.2 10/03/2016 1657   K 3.9 01/21/2015 0935   CL 103 10/03/2016 1657   CL 108 (H) 06/25/2012 1508   CO2 28 10/03/2016 1657   CO2 25 01/21/2015 0935   BUN 10 10/03/2016 1657   BUN 7.0 01/21/2015 0935   CREATININE 0.72 10/03/2016 1657   CREATININE 0.8 01/21/2015 0935      Component Value Date/Time   CALCIUM 9.6 10/03/2016 1657   CALCIUM 9.4 01/21/2015 0935   ALKPHOS 41 09/06/2016 1029   ALKPHOS 54 01/21/2015 0935   AST 22 09/06/2016 1029   AST 17 01/21/2015 0935   ALT 26 09/06/2016 1029   ALT 15 01/21/2015 0935   BILITOT 0.5 09/06/2016 1029   BILITOT 0.55 01/21/2015 0935       Impression and Plan:  57 year old woman with the:   1. 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 She has received intravenous iron on multiple occasions in the past as needed.  Last iron studies obtained in September 2018 continues to be within normal range. She  remains on oral iron supplements which she takes periodically.  Her hemoglobin is slightly down compared to previous visits and we will check her iron levels today. Risks and benefits of intravenous iron was reviewed again and the possibility that she might needed in the future was reviewed. She is agreeable to proceed with iron studies indicate that. After placing her iron stores with intravenous iron is likely she will not require this in the future.   2. Leukocytopenia: Benign etiology such as ethnic variation versus autoimmune etiology. Bone marrow biopsy done on 09/09/2013 was reviewed again and showed no abnormalities.  Her white cell count today is 3.3 with absolute neutrophil count of 1000.  She had not had any issues related to this finding without any recurrent infections. I recommended continued  observation and surveillance.  3. Rheumatoid arthritis: No recent exacerbations currently on Plaquenil.  4. Age-appropriate cancer screening: She is up-to-date and will be due for colonoscopy in the near future.  5. Followup will be in 6 months to follow her status.  15  minutes was spent with the patient face-to-face today.  More than 50% of time was dedicated to patient counseling, education and coordination of her care    Zola Button 3/5/20198:50 AM

## 2017-03-12 ENCOUNTER — Encounter (HOSPITAL_COMMUNITY): Payer: PRIVATE HEALTH INSURANCE

## 2017-03-19 ENCOUNTER — Ambulatory Visit (HOSPITAL_COMMUNITY)
Admission: RE | Admit: 2017-03-19 | Discharge: 2017-03-19 | Disposition: A | Discharge status: Home / Self Care | Payer: PRIVATE HEALTH INSURANCE | Source: Ambulatory Visit | Attending: Oncology | Admitting: Oncology

## 2017-03-19 DIAGNOSIS — D509 Iron deficiency anemia, unspecified: Secondary | ICD-10-CM | POA: Insufficient documentation

## 2017-03-19 MED ORDER — FERUMOXYTOL INJECTION 510 MG/17 ML
510.0000 mg | Freq: Once | INTRAVENOUS | Status: AC
  Administered 2017-03-19: 510 mg via INTRAVENOUS
  Filled 2017-03-19: qty 17

## 2017-03-19 MED ORDER — SODIUM CHLORIDE 0.9 % IV SOLN
Freq: Once | INTRAVENOUS | Status: AC
  Administered 2017-03-19: 10 mL/h via INTRAVENOUS

## 2017-03-19 NOTE — Discharge Instructions (Signed)

## 2017-03-19 NOTE — Progress Notes (Signed)
PATIENT CARE CENTER NOTE  Diagnosis: Iron Deficiency Anemia    Provider: Dr. Alen Blew   Procedure: Feraheme infusion   Note: Patient received infusion of IV Feraheme. Patient tolerated infusion well with no adverse reaction. Discharge instructions given to patient. Patient alert, oriented and ambulatory at discharge.

## 2017-03-26 ENCOUNTER — Ambulatory Visit (HOSPITAL_COMMUNITY)
Admission: RE | Admit: 2017-03-26 | Discharge: 2017-03-26 | Disposition: A | Discharge status: Home / Self Care | Payer: PRIVATE HEALTH INSURANCE | Source: Ambulatory Visit | Attending: Oncology | Admitting: Oncology

## 2017-03-26 DIAGNOSIS — D509 Iron deficiency anemia, unspecified: Secondary | ICD-10-CM | POA: Diagnosis not present

## 2017-03-26 MED ORDER — SODIUM CHLORIDE 0.9 % IV SOLN
510.0000 mg | Freq: Once | INTRAVENOUS | Status: AC
  Administered 2017-03-26: 510 mg via INTRAVENOUS
  Filled 2017-03-26: qty 17

## 2017-03-26 NOTE — Progress Notes (Signed)
PATIENT CARE CENTER NOTE  Diagnosis: Iron Deficiency Anemia    Provider: Dr. Alen Blew   Procedure: Feraheme infusion   Note: Patient received infusion of IV Feraheme. Patient tolerated infusion well with no adverse reaction. Discharge instructions given to patient. Patient alert, oriented and ambulatory at discharge.

## 2017-03-26 NOTE — Discharge Instructions (Signed)

## 2017-04-05 ENCOUNTER — Ambulatory Visit: Payer: PRIVATE HEALTH INSURANCE | Admitting: Cardiology

## 2017-04-16 ENCOUNTER — Telehealth: Payer: Self-pay | Admitting: Family Medicine

## 2017-04-16 NOTE — Telephone Encounter (Signed)
Copied from [place] [id number]. Topic: Quick Communication - See Telephone Encounter >> Apr 16, 2017  2:35 PM Vernona Rieger wrote: CRM for notification. See Telephone encounter for: 04/16/17.  Patient would like a copy of when she had her TB test done in June along with the results. Call when ready for pick up [phone]. She needs it today for orientation tomorrow. She is a [facility] patient but had this done at [facility]

## 2017-04-17 NOTE — Telephone Encounter (Signed)
Phone call to patient. Unable to reach. If patient calls back, please let her know a copy of her letter with TB results is available for pickup at [address].

## 2017-04-23 ENCOUNTER — Ambulatory Visit: Payer: PRIVATE HEALTH INSURANCE | Admitting: Family Medicine

## 2017-04-24 DIAGNOSIS — I5032 Chronic diastolic (congestive) heart failure: Secondary | ICD-10-CM | POA: Insufficient documentation

## 2017-04-24 DIAGNOSIS — I2510 Atherosclerotic heart disease of native coronary artery without angina pectoris: Secondary | ICD-10-CM

## 2017-04-24 HISTORY — DX: Chronic diastolic (congestive) heart failure: I50.32

## 2017-04-24 HISTORY — DX: Atherosclerotic heart disease of native coronary artery without angina pectoris: I25.10

## 2017-04-24 NOTE — Progress Notes (Deleted)
Cardiology Office Note    Date:  04/24/2017   ID:  Amber [name], DOB December 15, 1960, MRN [id number]  PCP:  Amber Alar, NP  Cardiologist: No primary care provider on file.  No chief complaint on file.   History of Present Illness:  Amber [name] is a 57 y.o. female with history of chest pain and chronic dyspnea on exertion.  Coronary CT 11/2016 showed calcium score of 22 and mild CAD in the proximal LAD and ostial OM1.  Medical therapy recommended.  Also showed a dilated pulmonary artery measuring 34 mmHg suggestive of pulmonary hypertension.  2D echo 10/2016 LVEF 55-60% with grade 2 DD she has chronic diastolic CHF palpitations with Holter monitor showing 3500 PACs and 24-hour.  Treated with diltiazem 120 mg daily at last office visit with Dr. Meda [name] 01/16/17    Past Medical History:  Diagnosis Date  . Anemia   . Arthritis   . Blood transfusion without reported diagnosis   . Colon polyps   . Ectopic pregnancy    RIGHT salpingostomy  . Elective abortion   . GERD (gastroesophageal reflux disease)   . Glaucoma    both eyes  . Headache    migraines  . History of chicken pox   . Hypertension   . Rheumatoid arthritis(714.0) 11/07/2011  . SAB (spontaneous abortion)   . Sjogren's disease [facility])     Past Surgical History:  Procedure Laterality Date  . ABDOMINAL SURGERY     bowel repair  . COLONOSCOPY    . DILATION AND CURETTAGE OF UTERUS     X 2  . MENISCUS REPAIR Right    MENISCUS TEAR REPAIR  . MYOMECTOMY N/A 07/27/2014   Procedure: ABDOMINAL MYOMECTOMY;  Surgeon: Waymon Amato, MD;  Location: [facility];  Service: Gynecology;  Laterality: N/A;  . PELVIC LAPAROSCOPY  1994   IN 1999 LAPAROSCOPY WITH INCIDENTAL ENTEROTOMY REQUIRING LAPAROTOMY  . UTERINE FIBROID SURGERY     July 2016    Current Medications: No outpatient medications have been marked as taking for the 04/25/17 encounter (Appointment) with Amber Burn, PA-C.     Allergies:   Patient has no  known allergies.   Social History   Socioeconomic History  . Marital status: Single    Spouse name: Not on file  . Number of children: 0  . Years of education: Not on file  . Highest education level: Not on file  Occupational History  . Occupation: LPN    Employer: [employer]  Social Needs  . Financial resource strain: Not on file  . Food insecurity:    Worry: Not on file    Inability: Not on file  . Transportation needs:    Medical: Not on file    Non-medical: Not on file  Tobacco Use  . Smoking status: Never Smoker  . Smokeless tobacco: Never Used  Substance and Sexual Activity  . Alcohol use: No    Alcohol/week: 0.0 oz  . Drug use: No  . Sexual activity: Yes    Birth control/protection: None    Comment: 1st intercourse- 89, partners- 5  Lifestyle  . Physical activity:    Days per week: Not on file    Minutes per session: Not on file  . Stress: Not on file  Relationships  . Social connections:    Talks on phone: Not on file    Gets together: Not on file    Attends religious service: Not on file    Active member of  club or organization: Not on file    Attends meetings of clubs or organizations: Not on file    Relationship status: Not on file  Other Topics Concern  . 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  Had a sister up in [place] who passed away Mar 31, 2015 who she was close to.   Born in [place]- she came to [place] as adult.  Age 59.            Family History:  The patient's family history includes Cancer in her sister; Diabetes in her sister and sister; Esophageal cancer in her sister; Heart disease in her mother; Hypertension in her mother and sister; Stomach cancer in her sister.   ROS:   Please see the history of present illness.    Review of Systems  Constitution: Negative.   HENT: Negative.   Eyes: Negative.   Cardiovascular: Negative.   Respiratory: Negative.   Hematologic/Lymphatic: Negative.   Musculoskeletal: Negative.  Negative for joint pain.  Gastrointestinal: Negative.   Genitourinary: Negative.   Neurological: Negative.    All other systems reviewed and are negative.   PHYSICAL EXAM:   VS:  LMP 06/01/2016   Physical Exam  GEN: Well nourished, well developed, in no acute distress  HEENT: normal  Neck: no JVD, carotid bruits, or masses Cardiac:RRR; no murmurs, rubs, or gallops  Respiratory:  clear to auscultation bilaterally, normal work of breathing GI: soft, nontender, nondistended, + BS Ext: without cyanosis, clubbing, or edema, Good distal pulses bilaterally MS: no deformity or atrophy  Skin: warm and dry, no rash Neuro:  Alert and Oriented x 3, Strength and sensation are intact Psych: euthymic mood, full affect  Wt Readings from Last 3 Encounters:  03/05/17 214 lb 3.2 oz (97.2 kg)  02/08/17 212 lb (96.2 kg)  02/01/17 209 lb 12.8 oz (95.2 kg)      Studies/Labs Reviewed:   EKG:  EKG is*** ordered today.  The ekg ordered today demonstrates ***  Recent Labs: 09/06/2016: ALT 26; TSH 1.13 10/03/2016: BUN 10; Creatinine, Ser 0.72; NT-Pro BNP 24; Potassium 4.2; Sodium 140 03/05/2017: Hemoglobin 11.5; Platelets 221   Lipid Panel    Component Value Date/Time   CHOL 131 09/06/2016 1029   TRIG 53.0 09/06/2016 1029   HDL 49.80 09/06/2016 1029   CHOLHDL 3 09/06/2016 1029   VLDL 10.6 09/06/2016 1029   LDLCALC 71 09/06/2016 1029    Additional studies/ records that were reviewed today include:  2D echo 10/2018Study Conclusions   - Left ventricle: The cavity size was normal. There was mild   concentric hypertrophy. Systolic function was normal. The   estimated ejection fraction was in the range of 55% to 60%. Wall   motion was normal; there were no regional wall motion   abnormalities. Features are consistent with a pseudonormal left    ventricular filling pattern, with concomitant abnormal relaxation   and increased filling pressure (grade 2 diastolic dysfunction).   Doppler parameters are consistent with indeterminate ventricular   filling pressure. - Aortic valve: Transvalvular velocity was within the normal range.   There was no stenosis. There was no regurgitation. - Mitral valve: Transvalvular velocity was within the normal range.   There was no evidence for stenosis. There was no regurgitation. -  Left atrium: The atrium was moderately dilated. - Right ventricle: The cavity size was normal. Wall thickness was   normal. Systolic function was normal. - Atrial septum: No defect or patent foramen ovale was identified. - Tricuspid valve: There was trivial regurgitation. - Pulmonary arteries: Systolic pressure was mildly increased. PA   peak pressure: 39 mm Hg (S).   Coronary CT 11/2018IMPRESSION: 1. Coronary calcium score of 22. This was 3 percentile for age and sex matched control.   2. Normal coronary origin with right dominance.   3. No evidence of CAD. 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 Medical therapy with beta-blockers and statins is indicated.   4. Dilated pulmonary artery measuring 34 mm suggestive of pulmonary hypertension.     Electronically Signed   By: Ena Dawley   On: 11/20/2016 15:35       ASSESSMENT:    1. Coronary artery disease involving native coronary artery of native heart without angina pectoris   2. Chronic diastolic CHF (congestive heart failure) ([place])   3. Essential hypertension   4. Palpitations      PLAN:  In order of problems listed above:  CAD mild on CTA recommend beta-blocker and statin  Chronic diastolic CHF  Essential hypertension  Palpitations with 3500 PACs and 24 hours diltiazem added in 01/2017    Medication Adjustments/Labs and Tests Ordered: Current medicines are reviewed at length with the patient  today.  Concerns regarding medicines are outlined above.  Medication changes, Labs and Tests ordered today are listed in the Patient Instructions below. There are no Patient Instructions on file for this visit.   Signed, Ermalinda Barrios, PA-C  04/24/2017 2:54 PM    [facility] [place], [place],   [address] Phone: [phone]; Fax: [phone]

## 2017-04-25 ENCOUNTER — Ambulatory Visit: Payer: PRIVATE HEALTH INSURANCE | Admitting: Physician Assistant

## 2017-04-25 DIAGNOSIS — R0989 Other specified symptoms and signs involving the circulatory and respiratory systems: Secondary | ICD-10-CM

## 2017-04-30 ENCOUNTER — Encounter: Payer: Self-pay | Admitting: Physician Assistant

## 2017-05-14 ENCOUNTER — Other Ambulatory Visit: Payer: Self-pay | Admitting: Family

## 2017-05-14 NOTE — Telephone Encounter (Signed)
Amber [name] -- lisinopril refills sent today. Pt last seen 02/2017 for acute issues and has no future follow up scheduled. When should pt follow up in the office?

## 2017-05-14 NOTE — Telephone Encounter (Signed)
Due for follow up please

## 2017-05-15 NOTE — Telephone Encounter (Signed)
Called pt and informed her that a refill has been sent and that she is due for a follow up. Scheduled an appt for 05/31/17.

## 2017-05-31 ENCOUNTER — Ambulatory Visit: Payer: PRIVATE HEALTH INSURANCE | Admitting: Family

## 2017-05-31 ENCOUNTER — Encounter: Payer: Self-pay | Admitting: Family

## 2017-05-31 DIAGNOSIS — R739 Hyperglycemia, unspecified: Secondary | ICD-10-CM | POA: Diagnosis not present

## 2017-05-31 DIAGNOSIS — D509 Iron deficiency anemia, unspecified: Secondary | ICD-10-CM

## 2017-05-31 DIAGNOSIS — J45909 Unspecified asthma, uncomplicated: Secondary | ICD-10-CM | POA: Diagnosis not present

## 2017-05-31 DIAGNOSIS — G43909 Migraine, unspecified, not intractable, without status migrainosus: Secondary | ICD-10-CM

## 2017-05-31 DIAGNOSIS — E119 Type 2 diabetes mellitus without complications: Secondary | ICD-10-CM

## 2017-05-31 DIAGNOSIS — I10 Essential (primary) hypertension: Secondary | ICD-10-CM | POA: Diagnosis not present

## 2017-05-31 HISTORY — DX: Type 2 diabetes mellitus without complications: E11.9

## 2017-05-31 LAB — BASIC METABOLIC PANEL
BUN: 20 mg/dL (ref 6–23)
CO2: 31 mEq/L (ref 19–32)
CREATININE: 0.65 mg/dL (ref 0.40–1.20)
Calcium: 9.9 mg/dL (ref 8.4–10.5)
Chloride: 102 mEq/L (ref 96–112)
GFR: 121.29 mL/min (ref >60.00)
Glucose, Bld: 109 mg/dL — ABNORMAL HIGH (ref 70–99)
Potassium: 4.3 mEq/L (ref 3.5–5.1)
Sodium: 140 mEq/L (ref 135–145)

## 2017-05-31 LAB — HEMOGLOBIN A1C: Hgb A1c MFr Bld: 6.5 % (ref 4.6–6.5)

## 2017-05-31 MED ORDER — ALBUTEROL SULFATE HFA 108 (90 BASE) MCG/ACT IN AERS: 2.0000 | INHALATION_SPRAY | Freq: Four times a day (QID) | RESPIRATORY_TRACT | 2 refills | Status: DC | PRN

## 2017-05-31 NOTE — Progress Notes (Signed)
Subjective:    Patient ID: Amber [name], female    DOB: 1960-11-13, 57 y.o.   MRN: [id number]  HPI  Patient is a 57 yr old female who presents today for follow up.  1) Anemia- She continues with Dr. Alen Blew for iron infusions.   Lab Results  Component Value Date   WBC 3.3 (L) 03/05/2017   HGB 11.5 (L) 03/05/2017   HCT 37.1 03/05/2017   MCV 87.5 03/05/2017   PLT 221 03/05/2017   2) HTN- lisinopril, metoprolol, diltiazem. BP Readings from Last 3 Encounters:  05/31/17 127/64  03/26/17 136/70  03/19/17 (!) 151/94   3) Hyperglycemia-  Lab Results  Component Value Date   HGBA1C 6.1 09/06/2016   4) Asthma- maintained on advair "on and off".  Reports that if she spends a lot of time outside.   5) HTN- on lisinopril, metoprolol, diltiazem. Uses lasix prn swelling about 2-3 times a week.   6) migraines- uses imitrex prn.  Has good relief with imitrex.   Review of Systems See HPI  Past Medical History:  Diagnosis Date  . Anemia   . Arthritis   . Blood transfusion without reported diagnosis   . Colon polyps   . Ectopic pregnancy    RIGHT salpingostomy  . Elective abortion   . GERD (gastroesophageal reflux disease)   . Glaucoma    both eyes  . Headache    migraines  . History of chicken pox   . Hypertension   . Rheumatoid arthritis(714.0) 11/07/2011  . SAB (spontaneous abortion)   . Sjogren's disease ([place])      Social History   Socioeconomic History  . Marital status: Single    Spouse name: Not on file  . Number of children: 0  . Years of education: Not on file  . Highest education level: Not on file  Occupational History  . Occupation: LPN    Employer: [employer]  Social Needs  . Financial resource strain: Not on file  . Food insecurity:    Worry: Not on file    Inability: Not on file  . Transportation needs:    Medical: Not on file    Non-medical: Not on file  Tobacco Use  . Smoking status: Never Smoker  . Smokeless tobacco: Never Used    Substance and Sexual Activity  . Alcohol use: No    Alcohol/week: 0.0 oz  . Drug use: No  . Sexual activity: Yes    Birth control/protection: None    Comment: 1st intercourse- 89, partners- 5  Lifestyle  . Physical activity:    Days per week: Not on file    Minutes per session: Not on file  . Stress: Not on file  Relationships  . Social connections:    Talks on phone: Not on file    Gets together: Not on file    Attends religious service: Not on file    Active member of club or organization: Not on file    Attends meetings of clubs or organizations: Not on file    Relationship status: Not on file  . Intimate partner violence:    Fear of current or ex partner: Not on file    Emotionally abused: Not on file    Physically abused: Not on file    Forced sexual activity: Not on file  Other Topics Concern  . 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  Had a sister up in [place] who passed away 03/14/2015 who she was close to.   Born in [place]- she came to [place] as adult.  Age 6.           Past Surgical History:  Procedure Laterality Date  . ABDOMINAL SURGERY     bowel repair  . COLONOSCOPY    . DILATION AND CURETTAGE OF UTERUS     X 2  . MENISCUS REPAIR Right    MENISCUS TEAR REPAIR  . MYOMECTOMY N/A 07/27/2014   Procedure: ABDOMINAL MYOMECTOMY;  Surgeon: Waymon Amato, MD;  Location: [facility];  Service: Gynecology;  Laterality: N/A;  . PELVIC LAPAROSCOPY  1994   IN 1999 LAPAROSCOPY WITH INCIDENTAL ENTEROTOMY REQUIRING LAPAROTOMY  . UTERINE FIBROID SURGERY     July 2016    Family History  Problem Relation Age of Onset  . Hypertension Mother        died from heart disease  . Heart disease Mother        deceased, unknown cause  . Diabetes Sister   . Hypertension Sister   . Esophageal cancer  Sister   . Stomach cancer Sister   . Cancer Sister   . Diabetes Sister   . Rectal cancer Neg Hx   . Colon cancer Neg Hx     No Known Allergies  Current Outpatient Medications on File Prior to Visit  Medication Sig Dispense Refill  . aspirin EC 81 MG tablet Take 1 tablet (81 mg total) by mouth daily.    . Cyanocobalamin (VITAMIN B-12) 5000 MCG LOZG Take 1 tablet by mouth 2 (two) times daily.    . ferrous sulfate 325 (65 FE) MG tablet Take 325 mg by mouth 3 (three) times daily with meals.     . Fluticasone-Salmeterol (ADVAIR DISKUS) 250-50 MCG/DOSE AEPB Inhale 1 puff into the lungs 2 (two) times daily. 1 each 3  . furosemide (LASIX) 20 MG tablet TAKE ONE TABLET BY MOUTH ONCE DAILY AS NEEDED FOR  FLUID 90 tablet 1  . lisinopril (PRINIVIL,ZESTRIL) 40 MG tablet TAKE 1 TABLET BY MOUTH ONCE DAILY 30 tablet 2  . metoprolol succinate (TOPROL XL) 50 MG 24 hr tablet Take 1 tablet (50 mg total) by mouth daily. Take with or immediately following a meal. 30 tablet 2  . metoprolol succinate (TOPROL-XL) 100 MG 24 hr tablet Take 1 tablet (100 mg total) by mouth daily. Take with or immediately following a meal. 30 tablet 2  . nitroGLYCERIN (NITROSTAT) 0.4 MG SL tablet Place 1 tablet (0.4 mg total) under the tongue every 5 (five) minutes as needed for chest pain. 25 tablet 1  . ondansetron (ZOFRAN) 4 MG tablet Take 1 tablet (4 mg total) by mouth every 8 (eight) hours as needed for nausea or vomiting. 20 tablet 0  . SF 5000 PLUS 1.1 % CREA dental cream As directed.    . SUMAtriptan (IMITREX) 50 MG tablet TAKE ONE TABLET BY MOUTH EVERY 2 HOURS AS NEEDED FOR  MIGRAINE  MAY  REPEAT  IN  2  HOURS  IF  HEADACHE  PERSISTS  OR  RECURS 9 tablet 5  . TRAVATAN Z 0.004 % SOLN ophthalmic solution Place 1 drop into both eyes at bedtime.     Marland Kitchen atorvastatin (LIPITOR) 40 MG tablet Take 1 tablet (40 mg total) by mouth  daily. 90 tablet 3  . diltiazem (CARDIZEM CD) 120 MG 24 hr capsule Take 1 capsule (120 mg total) by mouth  daily. 90 capsule 3  . hydrochlorothiazide (HYDRODIURIL) 25 MG tablet Take 1 tablet (25 mg total) by mouth daily. 30 tablet 3   No current facility-administered medications on file prior to visit.     BP 127/64 (BP Location: Left Arm, Cuff Size: Large)   Pulse 64   Temp 98.4 F (36.9 C) (Oral)   Resp 16   Ht _0  (1.549 m)   Wt 212 lb (96.2 kg)   LMP 06/01/2016   SpO2 100%   BMI 40.06 kg/m       Objective:   Physical Exam  Constitutional: She appears well-developed and well-nourished.  Cardiovascular: Normal rate, regular rhythm and normal heart sounds.  No murmur heard. Pulmonary/Chest: Effort normal and breath sounds normal. No respiratory distress. She has no wheezes.  Musculoskeletal:  Trace bilateral LE edema  Psychiatric: She has a normal mood and affect. Her behavior is normal. Judgment and thought content normal.          Assessment & Plan:  Hypertension-blood pressure stable on current meds continue same.  Obtain follow-up be met.  Hyperglycemia- obtain A1c.  Discussed diet.  Morbid obesity-she is requesting referral to the medical weight loss clinic.  Referral has been placed.  Migraines-has intermittent migraines with good relief from Imitrex.  Continue as needed Imitrex.  Iron deficiency anemia-continues to follow with hematology for IV iron infusions.  Asthma- currently stable.  She does not have a rescue inhaler.  Prescription sent for albuterol.

## 2017-06-14 ENCOUNTER — Encounter: Payer: Self-pay | Admitting: Family

## 2017-07-02 ENCOUNTER — Other Ambulatory Visit: Payer: Self-pay | Admitting: Physician Assistant

## 2017-07-02 DIAGNOSIS — I10 Essential (primary) hypertension: Secondary | ICD-10-CM

## 2017-07-19 ENCOUNTER — Encounter: Payer: Self-pay | Admitting: Medical

## 2017-07-19 ENCOUNTER — Ambulatory Visit: Payer: PRIVATE HEALTH INSURANCE | Admitting: Medical

## 2017-07-19 VITALS — BP 128/68 | HR 69 | Temp 99.2°F | Resp 16 | Ht 61.0 in | Wt 216.8 lb

## 2017-07-19 DIAGNOSIS — M791 Myalgia, unspecified site: Secondary | ICD-10-CM | POA: Diagnosis not present

## 2017-07-19 DIAGNOSIS — R509 Fever, unspecified: Secondary | ICD-10-CM

## 2017-07-19 DIAGNOSIS — H9201 Otalgia, right ear: Secondary | ICD-10-CM | POA: Diagnosis not present

## 2017-07-19 DIAGNOSIS — J3489 Other specified disorders of nose and nasal sinuses: Secondary | ICD-10-CM

## 2017-07-19 DIAGNOSIS — J029 Acute pharyngitis, unspecified: Secondary | ICD-10-CM

## 2017-07-19 LAB — POCT RAPID STREP A (OFFICE): RAPID STREP A SCREEN: POSITIVE — AB

## 2017-07-19 MED ORDER — AMOXICILLIN-POT CLAVULANATE 875-125 MG PO TABS: 1.0000 | ORAL_TABLET | Freq: Two times a day (BID) | ORAL | 0 refills | Status: DC

## 2017-07-19 MED ORDER — FLUTICASONE PROPIONATE 50 MCG/ACT NA SUSP: 2.0000 | Freq: Every day | NASAL | 1 refills | Status: DC

## 2017-07-19 NOTE — Patient Instructions (Addendum)
Your strep test came back positive.  Your right ear also looks to be early infected.  Some possible sinus infection as well.  Muscle aches that you described can be associated with strep infection.  I am prescribing Augmentin for the infections.  For nasal congestion, I prescribed Flonase.  We did a flu test today since you  had no complaint of sore throat initially but signficant myalgia.  We will update you on the flu test results when that is back.  Follow-up in 7 days or as needed.

## 2017-07-19 NOTE — Progress Notes (Signed)
Subjective:    Patient ID: Amber [name], female    DOB: 1960-08-04, 57 y.o.   MRN: [id number]  HPI  Pt in for some body aches all over. Some fever, some chills and sweats.   This started on Tuesday night. Pt had niece that was sick but more old like symptoms. Pt is not coughing. Her nose feels congested for one day.   Pt has not st. No pain on urination. Does report some rt ear pain last night.   No travel out of country.   Pt denies any tick bites on review.  No nausea or vomiting. Pt works in nursing home.   Review of Systems  Constitutional: Positive for chills, fatigue and fever.  HENT: Positive for ear pain and sinus pain. Negative for postnasal drip.   Respiratory: Negative for cough, chest tightness, shortness of breath and wheezing.   Cardiovascular: Negative for chest pain and palpitations.  Gastrointestinal: Negative for abdominal pain.  Musculoskeletal: Positive for myalgias.  Skin: Negative for rash.  Neurological: Negative for dizziness and headaches.  Hematological: Negative for adenopathy. Does not bruise/bleed easily.  Psychiatric/Behavioral: Negative for behavioral problems and confusion. The patient is not nervous/anxious.    Past Medical History:  Diagnosis Date  . Anemia   . Arthritis   . Blood transfusion without reported diagnosis   . Colon polyps   . Controlled type 2 diabetes mellitus without complication, without long-term current use of insulin ([place]) 05/31/2017  . Ectopic pregnancy    RIGHT salpingostomy  . Elective abortion   . GERD (gastroesophageal reflux disease)   . Glaucoma    both eyes  . Headache    migraines  . History of chicken pox   . Hypertension   . Rheumatoid arthritis(714.0) 11/07/2011  . SAB (spontaneous abortion)   . Sjogren's disease ([place])      Social History   Socioeconomic History  . Marital status: Single    Spouse name: Not on file  . Number of children: 0  . Years of education: Not on file  . Highest  education level: Not on file  Occupational History  . Occupation: LPN    Employer: [employer]  Social Needs  . Financial resource strain: Not on file  . Food insecurity:    Worry: Not on file    Inability: Not on file  . Transportation needs:    Medical: Not on file    Non-medical: Not on file  Tobacco Use  . Smoking status: Never Smoker  . Smokeless tobacco: Never Used  Substance and Sexual Activity  . Alcohol use: No    Alcohol/week: 0.0 oz  . Drug use: No  . Sexual activity: Yes    Birth control/protection: None    Comment: 1st intercourse- 52, partners- 5  Lifestyle  . Physical activity:    Days per week: Not on file    Minutes per session: Not on file  . Stress: Not on file  Relationships  . Social connections:    Talks on phone: Not on file    Gets together: Not on file    Attends religious service: Not on file    Active member of club or organization: Not on file    Attends meetings of clubs or organizations: Not on file    Relationship status: Not on file  . Intimate partner violence:    Fear of current or ex partner: Not on file    Emotionally abused: Not on file  Physically abused: Not on file    Forced sexual activity: Not on file  Other Topics Concern  . 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  Had a sister up in [place] who passed away 04/01/2015 who she was close to.   Born [place]- she came to [place] as adult.  Age 79.           Past Surgical History:  Procedure Laterality Date  . ABDOMINAL SURGERY     bowel repair  . COLONOSCOPY    . DILATION AND CURETTAGE OF UTERUS     X 2  . MENISCUS REPAIR Right    MENISCUS TEAR REPAIR  . MYOMECTOMY N/A 07/27/2014   Procedure: ABDOMINAL MYOMECTOMY;  Surgeon: Waymon Amato, MD;  Location: [facility];  Service: Gynecology;  Laterality:  N/A;  . PELVIC LAPAROSCOPY  1994   IN 1999 LAPAROSCOPY WITH INCIDENTAL ENTEROTOMY REQUIRING LAPAROTOMY  . UTERINE FIBROID SURGERY     July 2016    Family History  Problem Relation Age of Onset  . Hypertension Mother        died from heart disease  . Heart disease Mother        deceased, unknown cause  . Diabetes Sister   . Hypertension Sister   . Esophageal cancer Sister   . Stomach cancer Sister   . Cancer Sister   . Diabetes Sister   . Rectal cancer Neg Hx   . Colon cancer Neg Hx     No Known Allergies  Current Outpatient Medications on File Prior to Visit  Medication Sig Dispense Refill  . albuterol (PROVENTIL HFA;VENTOLIN HFA) 108 (90 Base) MCG/ACT inhaler Inhale 2 puffs into the lungs every 6 (six) hours as needed for wheezing or shortness of breath. 1 Inhaler 2  . aspirin EC 81 MG tablet Take 1 tablet (81 mg total) by mouth daily.    . Cyanocobalamin (VITAMIN B-12) 5000 MCG LOZG Take 1 tablet by mouth 2 (two) times daily.    . ferrous sulfate 325 (65 FE) MG tablet Take 325 mg by mouth 3 (three) times daily with meals.     . Fluticasone-Salmeterol (ADVAIR DISKUS) 250-50 MCG/DOSE AEPB Inhale 1 puff into the lungs 2 (two) times daily. 1 each 3  . furosemide (LASIX) 20 MG tablet TAKE ONE TABLET BY MOUTH ONCE DAILY AS NEEDED FOR  FLUID 90 tablet 1  . lisinopril (PRINIVIL,ZESTRIL) 40 MG tablet TAKE 1 TABLET BY MOUTH ONCE DAILY 30 tablet 2  . metoprolol succinate (TOPROL-XL) 100 MG 24 hr tablet Take 1 tablet by mouth daily in the AM (take with or immediately following a meal) 90 tablet 1  . metoprolol succinate (TOPROL-XL) 50 MG 24 hr tablet Take 1 tablet by mouth daily in the PM with or immediately following a meal. 90 tablet 1  . nitroGLYCERIN (NITROSTAT) 0.4 MG SL tablet Place 1 tablet (0.4 mg total) under the tongue every 5 (five) minutes as needed for chest pain. 25 tablet 1  . ondansetron (ZOFRAN) 4 MG tablet Take 1 tablet (4 mg total) by mouth every 8 (eight) hours as  needed for nausea or vomiting. 20 tablet 0  . SF 5000 PLUS 1.1 % CREA dental cream As directed.    . SUMAtriptan (IMITREX)  50 MG tablet TAKE ONE TABLET BY MOUTH EVERY 2 HOURS AS NEEDED FOR  MIGRAINE  MAY  REPEAT  IN  2  HOURS  IF  HEADACHE  PERSISTS  OR  RECURS 9 tablet 5  . TRAVATAN Z 0.004 % SOLN ophthalmic solution Place 1 drop into both eyes at bedtime.     Marland Kitchen atorvastatin (LIPITOR) 40 MG tablet Take 1 tablet (40 mg total) by mouth daily. 90 tablet 3  . diltiazem (CARDIZEM CD) 120 MG 24 hr capsule Take 1 capsule (120 mg total) by mouth daily. 90 capsule 3  . hydrochlorothiazide (HYDRODIURIL) 25 MG tablet Take 1 tablet (25 mg total) by mouth daily. 30 tablet 3   No current facility-administered medications on file prior to visit.     BP 128/68 (BP Location: Right Arm, Patient Position: Sitting, Cuff Size: Large)   Pulse 69   Temp 99.2 F (37.3 C) (Oral)   Resp 16   Ht 5\' 1"  (1.549 m)   Wt 216 lb 12.8 oz (98.3 kg)   LMP 06/01/2016   SpO2 100%   BMI 40.96 kg/m       Objective:   Physical Exam  General  Mental Status - Alert. General Appearance - Well groomed. Not in acute distress.  Skin Rashes- No Rashes.  HEENT Head- Normal. Ear Auditory Canal - Left- Normal. Right - Normal.Tympanic Membrane- Left- Normal. Right- mild red. Eye Sclera/Conjunctiva- Left- Normal. Right- Normal. Nose & Sinuses Nasal Mucosa- Left-  Boggy and Congested. Right-  Boggy and  Congested.Bilateral mild  maxillary but no  frontal sinus pressure. Mouth & Throat Lips: Upper Lip- Normal: no dryness, cracking, pallor, cyanosis, or vesicular eruption. Lower Lip-Normal: no dryness, cracking, pallor, cyanosis or vesicular eruption. Buccal Mucosa- Bilateral- No Aphthous ulcers. Oropharynx- No Discharge or Erythema. Tonsils: Characteristics- Bilateral- mild -moderate Erythema. Size/Enlargement- Bilateral- No enlargement. Discharge- bilateral-None.  Neck Neck- Supple. No Masses.   Chest and Lung  Exam Auscultation: Breath Sounds:-Clear even and unlabored.  Cardiovascular Auscultation:Rythm- Regular, rate and rhythm. Murmurs & Other Heart Sounds:Ausculatation of the heart reveal- No Murmurs.  Lymphatic Head & Neck General Head & Neck Lymphatics: Bilateral: Description- No Localized lymphadenopathy.       Assessment & Plan:  Your strep test came back positive.  Your right ear also looks to be early infected.  Some possible sinus infection as well.  Muscle aches that you described can be associated with strep infection.  I am prescribing Augmentin for the infections.  For nasal congestion, I prescribed Flonase.  We did a flu test today since you  had no complaint of sore throat initially but signficant myalgia.  We will update you on the flu test results when that is back.  Follow-up in 7 days or as needed.  Mackie Pai, PA-C

## 2017-08-22 ENCOUNTER — Encounter (INDEPENDENT_AMBULATORY_CARE_PROVIDER_SITE_OTHER): Payer: PRIVATE HEALTH INSURANCE

## 2017-09-03 ENCOUNTER — Inpatient Hospital Stay: Payer: PRIVATE HEALTH INSURANCE

## 2017-09-03 ENCOUNTER — Telehealth: Payer: Self-pay | Admitting: Oncology

## 2017-09-03 ENCOUNTER — Inpatient Hospital Stay: Payer: PRIVATE HEALTH INSURANCE | Attending: Oncology | Admitting: Oncology

## 2017-09-03 VITALS — BP 142/77 | HR 69 | Temp 98.5°F | Resp 17 | Ht 61.0 in | Wt 216.7 lb

## 2017-09-03 DIAGNOSIS — D500 Iron deficiency anemia secondary to blood loss (chronic): Secondary | ICD-10-CM | POA: Insufficient documentation

## 2017-09-03 DIAGNOSIS — D72819 Decreased white blood cell count, unspecified: Secondary | ICD-10-CM | POA: Insufficient documentation

## 2017-09-03 DIAGNOSIS — D508 Other iron deficiency anemias: Secondary | ICD-10-CM

## 2017-09-03 DIAGNOSIS — Z79899 Other long term (current) drug therapy: Secondary | ICD-10-CM | POA: Diagnosis not present

## 2017-09-03 DIAGNOSIS — M069 Rheumatoid arthritis, unspecified: Secondary | ICD-10-CM | POA: Insufficient documentation

## 2017-09-03 LAB — CBC WITH DIFFERENTIAL (CANCER CENTER ONLY)
BASOS ABS: 0 10*3/uL (ref 0.0–0.1)
BASOS PCT: 1 %
EOS PCT: 3 %
Eosinophils Absolute: 0.1 10*3/uL (ref 0.0–0.5)
HCT: 39.3 % (ref 34.8–46.6)
Hemoglobin: 12.9 g/dL (ref 11.6–15.9)
LYMPHS PCT: 55 %
Lymphs Abs: 1.5 10*3/uL (ref 0.9–3.3)
MCH: 28.7 pg (ref 25.1–34.0)
MCHC: 32.8 g/dL (ref 31.5–36.0)
MCV: 87.4 fL (ref 79.5–101.0)
MONOS PCT: 8 %
Monocytes Absolute: 0.2 10*3/uL (ref 0.1–0.9)
Neutro Abs: 0.9 10*3/uL — ABNORMAL LOW (ref 1.5–6.5)
Neutrophils Relative %: 33 %
PLATELETS: 208 10*3/uL (ref 145–400)
RBC: 4.5 MIL/uL (ref 3.70–5.45)
RDW: 13.4 % (ref 11.2–14.5)
WBC Count: 2.7 10*3/uL — ABNORMAL LOW (ref 3.9–10.3)

## 2017-09-03 LAB — IRON AND TIBC
IRON: 68 ug/dL (ref 41–142)
SATURATION RATIOS: 24 % (ref 21–57)
TIBC: 285 ug/dL (ref 236–444)
UIBC: 217 ug/dL

## 2017-09-03 LAB — FERRITIN: FERRITIN: 203 ng/mL (ref 11–307)

## 2017-09-03 NOTE — Telephone Encounter (Signed)
Appts scheduled AVS/ Calendar printed per 9/3 los °

## 2017-09-03 NOTE — Progress Notes (Signed)
Hematology and Oncology Follow Up Visit  Amber [name] [id number] Mar 21, 1960 57 y.o. 09/03/2017 8:24 AM   Principle Diagnosis: 57 year old woman with:  1.  Iron deficiency anemia diagnosed in 2007 after presenting with iron level of 34 and ferritin of 10 related to menstrual blood losses and uterine fibroids.    2. 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  Prior Therapy:  She is status post IV iron infusion in the form of Feraheme given on multiple occasions and repeated as needed.  Last treatment was in March 2019.  Current therapy: Iron sulfate 325 mg by mouth twice a day.  Interim History: Amber [name] is here for a follow-up visit.  Since the last visit, she received intravenous iron in March without complications.  He reported increased energy and exercise tolerance.  She still has arthralgias and myalgias related to her rheumatoid arthritis.  She denies any hematochezia or melena.  She continues to be on oral iron supplements and denies any GI complications related to it.  He denies any other blood losses.  She does not report any headaches, blurry vision, syncope or seizures.  She denies any alteration mental status or confusion.  She does not report any fevers, chills or sweats. She has not reported any chest pain or shortness of breath. She does not report any cough, wheezing or hemoptysis. She denied any nausea, vomiting, abdominal pain.  She denies any change in her bowel habits.. She does not report any frequency urgency or hesitancy. She does not report any lymphadenopathy or petechiae.  He denies any bleeding or clotting tendencies.  She denied any skin rashes or lesions. Remainder or view of system is negative.  Medications: I have reviewed the patient's current medications.  Current Outpatient Medications  Medication Sig Dispense Refill  . albuterol (PROVENTIL HFA;VENTOLIN HFA) 108 (90 Base) MCG/ACT  inhaler Inhale 2 puffs into the lungs every 6 (six) hours as needed for wheezing or shortness of breath. 1 Inhaler 2  . amoxicillin-clavulanate (AUGMENTIN) 875-125 MG tablet Take 1 tablet by mouth 2 (two) times daily. 20 tablet 0  . aspirin EC 81 MG tablet Take 1 tablet (81 mg total) by mouth daily.    Marland Kitchen atorvastatin (LIPITOR) 40 MG tablet Take 1 tablet (40 mg total) by mouth daily. 90 tablet 3  . Cyanocobalamin (VITAMIN B-12) 5000 MCG LOZG Take 1 tablet by mouth 2 (two) times daily.    Marland Kitchen diltiazem (CARDIZEM CD) 120 MG 24 hr capsule Take 1 capsule (120 mg total) by mouth daily. 90 capsule 3  . ferrous sulfate 325 (65 FE) MG tablet Take 325 mg by mouth 3 (three) times daily with meals.     . fluticasone (FLONASE) 50 MCG/ACT nasal spray Place 2 sprays into both nostrils daily. 16 g 1  . Fluticasone-Salmeterol (ADVAIR DISKUS) 250-50 MCG/DOSE AEPB Inhale 1 puff into the lungs 2 (two) times daily. 1 each 3  . furosemide (LASIX) 20 MG tablet TAKE ONE TABLET BY MOUTH ONCE DAILY AS NEEDED FOR  FLUID 90 tablet 1  . hydrochlorothiazide (HYDRODIURIL) 25 MG tablet Take 1 tablet (25 mg total) by mouth daily. 30 tablet 3  . lisinopril (PRINIVIL,ZESTRIL) 40 MG tablet TAKE 1 TABLET BY MOUTH ONCE DAILY 30 tablet 2  . metoprolol succinate (TOPROL-XL) 100 MG 24 hr tablet Take 1 tablet by mouth daily in the AM (take with or immediately following a meal) 90 tablet 1  . metoprolol succinate (TOPROL-XL) 50 MG  24 hr tablet Take 1 tablet by mouth daily in the PM with or immediately following a meal. 90 tablet 1  . nitroGLYCERIN (NITROSTAT) 0.4 MG SL tablet Place 1 tablet (0.4 mg total) under the tongue every 5 (five) minutes as needed for chest pain. 25 tablet 1  . ondansetron (ZOFRAN) 4 MG tablet Take 1 tablet (4 mg total) by mouth every 8 (eight) hours as needed for nausea or vomiting. 20 tablet 0  . SF 5000 PLUS 1.1 % CREA dental cream As directed.    . SUMAtriptan (IMITREX) 50 MG tablet TAKE ONE TABLET BY MOUTH EVERY 2  HOURS AS NEEDED FOR  MIGRAINE  MAY  REPEAT  IN  2  HOURS  IF  HEADACHE  PERSISTS  OR  RECURS 9 tablet 5  . TRAVATAN Z 0.004 % SOLN ophthalmic solution Place 1 drop into both eyes at bedtime.      No current facility-administered medications for this visit.      Allergies: No Known Allergies  Family history, social history and surgical history was reviewed and updated today.   Physical Exam:  Blood pressure (!) 142/77, pulse 69, temperature 98.5 F (36.9 C), temperature source Oral, resp. rate 17, height 5' 1"  (1.549 m), weight 216 lb 11.2 oz (98.3 kg), last menstrual period 06/01/2016, SpO2 100 %.   ECOG: 0   General appearance: Comfortable appearing without any discomfort Head: Normocephalic without any trauma Oropharynx: Mucous membranes are moist and pink without any thrush or ulcers. Eyes: Pupils are equal and round reactive to light. Lymph nodes: No cervical, supraclavicular, inguinal or axillary lymphadenopathy.   Heart:regular rate and rhythm.  S1 and S2 without leg edema. Lung: Clear without any rhonchi or wheezes.  No dullness to percussion. Abdomin: Soft, nontender, nondistended with good bowel sounds.  No hepatosplenomegaly. Musculoskeletal: No joint deformity or effusion.  Full range of motion noted. Neurological: No deficits noted on motor, sensory and deep tendon reflex exam. Skin: No petechial rash or dryness.  Appeared moist.  Psychiatric: Mood and affect appeared appropriate.     Lab Results: Lab Results  Component Value Date   WBC 3.3 (L) 03/05/2017   HGB 11.5 (L) 03/05/2017   HCT 37.1 03/05/2017   MCV 87.5 03/05/2017   PLT 221 03/05/2017     Chemistry      Component Value Date/Time   NA 140 05/31/2017 0819   NA 140 10/03/2016 1657   NA 138 01/21/2015 0935   K 4.3 05/31/2017 0819   K 3.9 01/21/2015 0935   CL 102 05/31/2017 0819   CL 108 (H) 06/25/2012 1508   CO2 31 05/31/2017 0819   CO2 25 01/21/2015 0935   BUN 20 05/31/2017 0819   BUN 10  10/03/2016 1657   BUN 7.0 01/21/2015 0935   CREATININE 0.65 05/31/2017 0819   CREATININE 0.8 01/21/2015 0935      Component Value Date/Time   CALCIUM 9.9 05/31/2017 0819   CALCIUM 9.4 01/21/2015 0935   ALKPHOS 41 09/06/2016 1029   ALKPHOS 54 01/21/2015 0935   AST 22 09/06/2016 1029   AST 17 01/21/2015 0935   ALT 26 09/06/2016 1029   ALT 15 01/21/2015 0935   BILITOT 0.5 09/06/2016 1029   BILITOT 0.55 01/21/2015 0935       Impression and Plan:  57 year old woman with the:   1. Iron deficiency anemia detected in 2007 and related to chronic menstrual blood losses.    Iron studies obtained in March 2019 showed iron of  57 and ferritin of 22.  She received intravenous iron at the time I was well-tolerated.  Her hemoglobin today was much improved up to 12.9 and iron studies are pending.  I have recommended continuing oral iron for the time being and will continue to supplement her with intravenous iron as needed.  2. Leukocytopenia: Remains fluctuating with likely benign etiology.  Bone marrow biopsy did not show any abnormalities.  This could be related to Plaquenil and rheumatoid arthritis.   3. Rheumatoid arthritis: Manageable at this time.  She remains on Plaquenil.  4. Age-appropriate cancer screening: She remains up-to-date at this time.  5. Followup will be in 6 months to follow her status.  15  minutes was spent with the patient face-to-face today.  More than 50% of time was dedicated to discussing the natural course of her disease, reviewing laboratory data and coordinating her care.    Zola Button 9/3/20198:24 AM

## 2017-09-04 ENCOUNTER — Ambulatory Visit (INDEPENDENT_AMBULATORY_CARE_PROVIDER_SITE_OTHER): Payer: PRIVATE HEALTH INSURANCE | Admitting: Family Medicine

## 2017-09-04 ENCOUNTER — Encounter (INDEPENDENT_AMBULATORY_CARE_PROVIDER_SITE_OTHER): Payer: Self-pay | Admitting: Family Medicine

## 2017-09-04 VITALS — BP 132/77 | HR 61 | Temp 98.0°F | Ht 61.0 in | Wt 211.0 lb

## 2017-09-04 DIAGNOSIS — Z9189 Other specified personal risk factors, not elsewhere classified: Secondary | ICD-10-CM | POA: Diagnosis not present

## 2017-09-04 DIAGNOSIS — Z0289 Encounter for other administrative examinations: Secondary | ICD-10-CM

## 2017-09-04 DIAGNOSIS — I10 Essential (primary) hypertension: Secondary | ICD-10-CM

## 2017-09-04 DIAGNOSIS — R0602 Shortness of breath: Secondary | ICD-10-CM | POA: Insufficient documentation

## 2017-09-04 DIAGNOSIS — R0600 Dyspnea, unspecified: Secondary | ICD-10-CM | POA: Insufficient documentation

## 2017-09-04 DIAGNOSIS — I119 Hypertensive heart disease without heart failure: Secondary | ICD-10-CM | POA: Insufficient documentation

## 2017-09-04 DIAGNOSIS — Z1331 Encounter for screening for depression: Secondary | ICD-10-CM | POA: Diagnosis not present

## 2017-09-04 DIAGNOSIS — R5383 Other fatigue: Secondary | ICD-10-CM | POA: Diagnosis not present

## 2017-09-04 DIAGNOSIS — R7303 Prediabetes: Secondary | ICD-10-CM | POA: Diagnosis not present

## 2017-09-04 DIAGNOSIS — Z6839 Body mass index (BMI) 39.0-39.9, adult: Secondary | ICD-10-CM

## 2017-09-04 DIAGNOSIS — R0609 Other forms of dyspnea: Secondary | ICD-10-CM | POA: Insufficient documentation

## 2017-09-04 HISTORY — DX: Essential (primary) hypertension: I10

## 2017-09-04 NOTE — Progress Notes (Signed)
Office: [phone]  /  Fax: [phone]   Dear [name], NP,   Thank you for referring Amber [name] to our clinic. The following note includes my evaluation and treatment recommendations.  HPI:   Chief Complaint: OBESITY    Amber [name] has been referred by [name], NP for consultation regarding her obesity and obesity related comorbidities.    MANIE BEALER (MR# [id number]) is a 57 y.o. female who presents on 09/04/2017 for obesity evaluation and treatment. Current BMI is Body mass index is 39.87 kg/m.[name] has been struggling with her weight for many years and has been unsuccessful in either losing weight, maintaining weight loss, or reaching her healthy weight goal.     [name] attended our information session and states she is currently in the action stage of change and ready to dedicate time achieving and maintaining a healthier weight. Amber [name] is interested in becoming our patient and working on intensive lifestyle modifications including (but not limited to) diet, exercise and weight loss.    Amber [name] states her desired weight loss is 80 lbs she has been heavy most of  her life she started gaining weight 2 yrs ago her heaviest weight ever was 216 lbs. she has significant food cravings issues  she snacks frequently in the evenings she skips meals frequently she is trying to eat vegetarian she is frequently drinking liquids with calories she frequently makes poor food choices she has problems with excessive hunger  she frequently eats larger portions than normal  she has binge eating behaviors she struggles with emotional eating    Fatigue Malasia feels her energy is lower than it should be. This has worsened with weight gain and has not worsened recently. Tawanda admits to daytime somnolence and admits to waking up still tired. Patient is at risk for obstructive sleep apnea. Patent has a history of symptoms of  daytime fatigue, morning fatigue, morning headache and hypertension. Patient generally gets 0 hours of sleep per night (works at night 11 PM to 7 AM), and states they generally have restless sleep. Snoring is present. Apneic episodes are not present. Epworth Sleepiness Score is 11  EKG was ordered today and shows normal sinus rhythm with bradycardia.  Dyspnea on exertion Amber [name] notes increasing shortness of breath with exercising and seems to be worsening over time with weight gain. She notes getting out of breath sooner with activity than she used to. This has not gotten worse recently. EKG was ordered today and shows normal sinus rhythm with bradycardia. Amber [name] denies orthopnea.  Hypertension Amber [name] is a 57 y.o. female with hypertension. She is currently taking four blood pressure medications and has been on medications for three years. Brigitte Pulse Moor denies chest pain or shortness of breath on exertion. She is working weight loss to help control her blood pressure with the goal of decreasing her risk of heart attack and stroke. [place] blood pressure is controlled today.  Pre-Diabetes [name] has a diagnosis of prediabetes and has had this diagnosis for six years. She was informed this puts her at greater risk of developing diabetes. Amber [name] is not taking metformin currently and she is attempting to work on diet and exercise to decrease risk of diabetes. She denies nausea or hypoglycemia.  At risk for diabetes Amber [name] is at higher than average risk for developing diabetes due to her obesity and prediabetes. She currently denies polyuria or polydipsia.  Depression Screen [name]'s Food and Mood (modified PHQ-9) score was  Depression screen  PHQ 2/9 09/04/2017  Decreased Interest 2  Down, Depressed, Hopeless 1  PHQ - 2 Score 3  Altered sleeping 2  Tired, decreased energy 1  Change in appetite 1  Feeling bad or failure about yourself  2  Trouble  concentrating 1  Moving slowly or fidgety/restless 0  Suicidal thoughts 0  PHQ-9 Score 10  Difficult doing work/chores Somewhat difficult    ALLERGIES: No Known Allergies  MEDICATIONS: Current Outpatient Medications on File Prior to Visit  Medication Sig Dispense Refill  . albuterol (PROVENTIL HFA;VENTOLIN HFA) 108 (90 Base) MCG/ACT inhaler Inhale 2 puffs into the lungs every 6 (six) hours as needed for wheezing or shortness of breath. 1 Inhaler 2  . aspirin EC 81 MG tablet Take 1 tablet (81 mg total) by mouth daily.    Marland Kitchen atorvastatin (LIPITOR) 40 MG tablet Take 1 tablet (40 mg total) by mouth daily. 90 tablet 3  . Cyanocobalamin (VITAMIN B-12) 5000 MCG LOZG Take 1 tablet by mouth 2 (two) times daily.    . ferrous sulfate 325 (65 FE) MG tablet Take 325 mg by mouth 3 (three) times daily with meals.     . fluticasone (FLONASE) 50 MCG/ACT nasal spray Place 2 sprays into both nostrils daily. 16 g 1  . Fluticasone-Salmeterol (ADVAIR DISKUS) 250-50 MCG/DOSE AEPB Inhale 1 puff into the lungs 2 (two) times daily. 1 each 3  . furosemide (LASIX) 20 MG tablet TAKE ONE TABLET BY MOUTH ONCE DAILY AS NEEDED FOR  FLUID 90 tablet 1  . hydrochlorothiazide (HYDRODIURIL) 25 MG tablet Take 1 tablet (25 mg total) by mouth daily. 30 tablet 3  . lisinopril (PRINIVIL,ZESTRIL) 40 MG tablet TAKE 1 TABLET BY MOUTH ONCE DAILY 30 tablet 2  . metoprolol succinate (TOPROL-XL) 100 MG 24 hr tablet Take 1 tablet by mouth daily in the AM (take with or immediately following a meal) (Patient taking differently: 150 mg. Take 1 tablet by mouth daily in the AM (take with or immediately following a meal)) 90 tablet 1  . ondansetron (ZOFRAN) 4 MG tablet Take 1 tablet (4 mg total) by mouth every 8 (eight) hours as needed for nausea or vomiting. 20 tablet 0  . SF 5000 PLUS 1.1 % CREA dental cream As directed.    . SUMAtriptan (IMITREX) 50 MG tablet TAKE ONE TABLET BY MOUTH EVERY 2 HOURS AS NEEDED FOR  MIGRAINE  MAY  REPEAT  IN  2   HOURS  IF  HEADACHE  PERSISTS  OR  RECURS 9 tablet 5  . TRAVATAN Z 0.004 % SOLN ophthalmic solution Place 1 drop into both eyes at bedtime.     Marland Kitchen diltiazem (CARDIZEM CD) 120 MG 24 hr capsule Take 1 capsule (120 mg total) by mouth daily. 90 capsule 3   No current facility-administered medications on file prior to visit.     PAST MEDICAL HISTORY: Past Medical History:  Diagnosis Date  . Anemia   . Arthritis   . Blood transfusion without reported diagnosis   . Colon polyps   . Controlled type 2 diabetes mellitus without complication, without long-term current use of insulin ([place]) 05/31/2017  . Ectopic pregnancy    RIGHT salpingostomy  . Elective abortion   . GERD (gastroesophageal reflux disease)   . Glaucoma    both eyes  . Headache    migraines  . History of chicken pox   . Hyperlipemia   . Hypertension   . Rheumatoid arthritis(714.0) 11/07/2011  . SAB (spontaneous abortion)   .  Shortness of breath   . Sjogren's disease ([place])   . Wheezing     PAST SURGICAL HISTORY: Past Surgical History:  Procedure Laterality Date  . ABDOMINAL SURGERY     bowel repair  . COLONOSCOPY    . DILATION AND CURETTAGE OF UTERUS     X 2  . MENISCUS REPAIR Right    MENISCUS TEAR REPAIR  . MYOMECTOMY N/A 07/27/2014   Procedure: ABDOMINAL MYOMECTOMY;  Surgeon: Waymon Amato, MD;  Location: [facility];  Service: Gynecology;  Laterality: N/A;  . PELVIC LAPAROSCOPY  1994   IN 1999 LAPAROSCOPY WITH INCIDENTAL ENTEROTOMY REQUIRING LAPAROTOMY  . UTERINE FIBROID SURGERY     July 2016    SOCIAL HISTORY: Social History   Tobacco Use  . Smoking status: Never Smoker  . Smokeless tobacco: Never Used  Substance Use Topics  . Alcohol use: No    Alcohol/week: 0.0 standard drinks  . Drug use: No    FAMILY HISTORY: Family History  Problem Relation Age of Onset  . Hypertension Mother        died from heart disease  . Heart disease Mother        deceased, unknown cause  . Stroke Mother   . Obesity Mother    . Diabetes Sister   . Hypertension Sister   . Esophageal cancer Sister   . Stomach cancer Sister   . Cancer Sister   . Diabetes Sister   . Rectal cancer Neg Hx   . Colon cancer Neg Hx     ROS: Review of Systems  Constitutional: Positive for malaise/fatigue.  HENT: Positive for congestion (nasal stuffiness) and ear pain.        Dry Mouth  Eyes:       Wear Glasses  Respiratory: Positive for shortness of breath and wheezing.   Cardiovascular: Positive for palpitations. Negative for orthopnea.       Positive for Shortness of Breath with activity  Gastrointestinal: Positive for nausea.  Genitourinary: Negative for frequency.  Musculoskeletal: Positive for back pain.       Muscle or Joint Pain  Skin:       Hair or Nail Changes  Neurological: Positive for headaches.  Endo/Heme/Allergies: Negative for polydipsia.       Positive for polyphagia Negative for hypoglycemia    PHYSICAL EXAM: Blood pressure 132/77, pulse 61, temperature 98 F (36.7 C), temperature source Oral, height 5\' 1"  (1.549 m), weight 211 lb (95.7 kg), last menstrual period 08/09/2016, SpO2 99 %. Body mass index is 39.87 kg/m. Physical Exam  Constitutional: She is oriented to person, place, and time. She appears well-developed and well-nourished.  HENT:  Head: Normocephalic and atraumatic.  Nose: Nose normal.  Eyes: EOM are normal. No scleral icterus.  Neck: Normal range of motion. Neck supple. No thyromegaly present.  Cardiovascular: Regular rhythm. Bradycardia present.  Pulmonary/Chest: Effort normal. No respiratory distress.  Abdominal: Soft. There is no tenderness.  + obesity  Musculoskeletal: Normal range of motion.  Neurological: She is alert and oriented to person, place, and time. Coordination normal.  Range of Motion is normal in all 4 extremities  Skin: Skin is warm and dry.  Psychiatric: She has a normal mood and affect. Her behavior is normal.  Vitals reviewed.   RECENT LABS AND  TESTS: BMET    Component Value Date/Time   NA 140 05/31/2017 0819   NA 140 10/03/2016 1657   NA 138 01/21/2015 0935   K 4.3 05/31/2017 0819   K 3.9  01/21/2015 0935   CL 102 05/31/2017 0819   CL 108 (H) 06/25/2012 1508   CO2 31 05/31/2017 0819   CO2 25 01/21/2015 0935   GLUCOSE 109 (H) 05/31/2017 0819   GLUCOSE 128 01/21/2015 0935   GLUCOSE 107 (H) 06/25/2012 1508   BUN 20 05/31/2017 0819   BUN 10 10/03/2016 1657   BUN 7.0 01/21/2015 0935   CREATININE 0.65 05/31/2017 0819   CREATININE 0.8 01/21/2015 0935   CALCIUM 9.9 05/31/2017 0819   CALCIUM 9.4 01/21/2015 0935   GFRNONAA 95 10/03/2016 1657   GFRNONAA >89 10/01/2012 1412   GFRAA 109 10/03/2016 1657   GFRAA >89 10/01/2012 1412   Lab Results  Component Value Date   HGBA1C 6.5 05/31/2017   No results found for: INSULIN CBC    Component Value Date/Time   WBC 2.7 (L) 09/03/2017 0813   WBC 3.3 (L) 03/05/2017 0835   RBC 4.50 09/03/2017 0813   HGB 12.9 09/03/2017 0813   HGB 12.4 09/13/2016 0935   HCT 39.3 09/03/2017 0813   HCT 39.6 09/13/2016 0935   PLT 208 09/03/2017 0813   PLT 194 09/13/2016 0935   MCV 87.4 09/03/2017 0813   MCV 87.8 09/13/2016 0935   MCH 28.7 09/03/2017 0813   MCHC 32.8 09/03/2017 0813   RDW 13.4 09/03/2017 0813   RDW 14.4 09/13/2016 0935   LYMPHSABS 1.5 09/03/2017 0813   LYMPHSABS 1.4 09/13/2016 0935   MONOABS 0.2 09/03/2017 0813   MONOABS 0.2 09/13/2016 0935   EOSABS 0.1 09/03/2017 0813   EOSABS 0.1 09/13/2016 0935   BASOSABS 0.0 09/03/2017 0813   BASOSABS 0.0 09/13/2016 0935   Iron/TIBC/Ferritin/ %Sat    Component Value Date/Time   IRON 68 09/03/2017 0812   IRON 79 09/13/2016 0935   TIBC 285 09/03/2017 0812   TIBC 326 09/13/2016 0935   FERRITIN 203 09/03/2017 0812   FERRITIN 40 09/13/2016 0935   IRONPCTSAT 24 09/03/2017 0812   IRONPCTSAT 24 09/13/2016 0935   IRONPCTSAT 14 (L) 06/25/2012 1508   Lipid Panel     Component Value Date/Time   CHOL 131 09/06/2016 1029   TRIG 53.0  09/06/2016 1029   HDL 49.80 09/06/2016 1029   CHOLHDL 3 09/06/2016 1029   VLDL 10.6 09/06/2016 1029   LDLCALC 71 09/06/2016 1029   Hepatic Function Panel     Component Value Date/Time   PROT 9.6 (H) 09/14/2016 0818   PROT 9.2 (H) 01/21/2015 0935   ALBUMIN 3.9 09/06/2016 1029   ALBUMIN 3.5 01/21/2015 0935   AST 22 09/06/2016 1029   AST 17 01/21/2015 0935   ALT 26 09/06/2016 1029   ALT 15 01/21/2015 0935   ALKPHOS 41 09/06/2016 1029   ALKPHOS 54 01/21/2015 0935   BILITOT 0.5 09/06/2016 1029   BILITOT 0.55 01/21/2015 0935   BILIDIR 0.2 05/14/2016 1227      Component Value Date/Time   TSH 1.13 09/06/2016 1029   TSH 0.77 05/14/2016 1227   TSH 1.42 03/30/2015 0744    ECG  shows NSR with a rate of 59 BPM INDIRECT CALORIMETER done today shows a VO2 of 233 and a REE of 1625.  Her calculated basal metabolic rate is 1245 thus her basal metabolic rate is worse than expected.    ASSESSMENT AND PLAN: Other fatigue - Plan: EKG 12-Lead, Vitamin B12, Folate, Lipid Panel With LDL/HDL Ratio, T3, T4, free, TSH, VITAMIN D 25 Hydroxy (Vit-D Deficiency, Fractures)  Shortness of breath on exertion  Essential hypertension - Plan: Lipid Panel With  LDL/HDL Ratio  Prediabetes - Plan: Comprehensive metabolic panel, Hemoglobin A1c, Insulin, random  Depression screening  At risk for diabetes mellitus  Class 2 severe obesity with serious comorbidity and body mass index (BMI) of 39.0 to 39.9 in adult, unspecified obesity type ([place])  PLAN: Fatigue Lavene was informed that her fatigue may be related to obesity, depression or many other causes. Labs will be ordered, and in the meanwhile Samoa has agreed to work on diet, exercise and weight loss to help with fatigue. Proper sleep hygiene was discussed including the need for 7-8 hours of quality sleep each night. A sleep study was not ordered based on symptoms and Epworth score. We will order indirect calorimetry and EKG today.  Dyspnea on  exertion Kanda's shortness of breath appears to be obesity related and exercise induced. She has agreed to work on weight loss and gradually increase exercise to treat her exercise induced shortness of breath. If Caera follows our instructions and loses weight without improvement of her shortness of breath, we will plan to refer to pulmonology. We will order labs, EKG and indirect calorimetry today. We will monitor this condition regularly. Sakeenah agrees to this plan.  Hypertension We discussed sodium restriction, working on healthy weight loss, and a regular exercise program as the means to achieve improved blood pressure control. Miche agreed with this plan and agreed to follow up as directed. We will continue to monitor her blood pressure as well as her progress with the above lifestyle modifications. She will continue her medications as prescribed and will watch for signs of hypotension as she continues her lifestyle modifications. We will order CMP and EKG today.  Pre-Diabetes Castella will continue to work on weight loss, exercise, and decreasing simple carbohydrates in her diet to help decrease the risk of diabetes. She was informed that eating too many simple carbohydrates or too many calories at one sitting increases the likelihood of GI side effects. We will order Hgb A1c and insulin level today and Dajah agreed to follow up with [place] as directed to monitor her progress.  Diabetes risk counseling Milla was given extended (15 minutes) diabetes prevention counseling today. She is 57 y.o. female and has risk factors for diabetes including obesity and prediabetes. We discussed intensive lifestyle modifications today with an emphasis on weight loss as well as increasing exercise and decreasing simple carbohydrates in her diet.  Depression Screen Magie had a moderately positive depression screening. Depression is commonly associated with obesity and often results  in emotional eating behaviors. We will monitor this closely and work on CBT to help improve the non-hunger eating patterns. Referral to Psychology may be required if no improvement is seen as she continues in our clinic.  Obesity Zeenat is currently in the action stage of change and her goal is to continue with weight loss efforts. I recommend Hallee begin the structured treatment plan as follows:  She has agreed to follow the Category 2 plan Yula has been instructed to eventually work up to a goal of 150 minutes of combined cardio and strengthening exercise per week for weight loss and overall health benefits. We discussed the following Behavioral Modification Strategies today: no skipping meals, planning for success, increasing lean protein intake and work on meal planning and easy cooking plans   She was informed of the importance of frequent follow up visits to maximize her success with intensive lifestyle modifications for her multiple health conditions. She was informed we would discuss her lab results at her next  visit unless there is a critical issue that needs to be addressed sooner. Daphna agreed to keep her next visit at the agreed upon time to discuss these results.    OBESITY BEHAVIORAL INTERVENTION VISIT  Today's visit was # 1   Starting weight: 211 lbs Starting date: 09/04/17 Today's weight : 211 lbs  Today's date: 09/04/2017 Total lbs lost to date: 0   ASK: We discussed the diagnosis of obesity with Brigitte Pulse Matthes today and Oralee agreed to give [place] permission to discuss obesity behavioral modification therapy today.  ASSESS: Iliani has the diagnosis of obesity and her BMI today is 39.89 Maeven is in the action stage of change   ADVISE: Jacoby was educated on the multiple health risks of obesity as well as the benefit of weight loss to improve her health. She was advised of the need for long term treatment and the importance of  lifestyle modifications to improve her current health and to decrease her risk of future health problems.  AGREE: Multiple dietary modification options and treatment options were discussed and  Valkyrie agreed to follow the recommendations documented in the above note.  ARRANGE: Raschelle was educated on the importance of frequent visits to treat obesity as outlined per CMS and USPSTF guidelines and agreed to schedule her next follow up appointment today.  I, Doreene Nest, am acting as transcriptionist for Eber Jones, MD  I have reviewed the above documentation for accuracy and completeness, and I agree with the above. - Ilene Qua, MD

## 2017-09-05 LAB — COMPREHENSIVE METABOLIC PANEL
ALK PHOS: 55 IU/L (ref 39–117)
ALT: 32 IU/L (ref 0–32)
AST: 32 IU/L (ref 0–40)
Albumin/Globulin Ratio: 0.9 — ABNORMAL LOW (ref 1.2–2.2)
Albumin: 4.4 g/dL (ref 3.5–5.5)
BUN/Creatinine Ratio: 17 (ref 9–23)
BUN: 10 mg/dL (ref 6–24)
Bilirubin Total: 1 mg/dL (ref 0.0–1.2)
CO2: 24 mmol/L (ref 20–29)
CREATININE: 0.6 mg/dL (ref 0.57–1.00)
Calcium: 9.8 mg/dL (ref 8.7–10.2)
Chloride: 100 mmol/L (ref 96–106)
GFR calc non Af Amer: 102 mL/min/{1.73_m2} (ref >59)
GFR, EST AFRICAN AMERICAN: 118 mL/min/{1.73_m2} (ref >59)
GLUCOSE: 82 mg/dL (ref 65–99)
Globulin, Total: 5.1 g/dL — ABNORMAL HIGH (ref 1.5–4.5)
Potassium: 4.1 mmol/L (ref 3.5–5.2)
Sodium: 140 mmol/L (ref 134–144)
TOTAL PROTEIN: 9.5 g/dL — AB (ref 6.0–8.5)

## 2017-09-05 LAB — LIPID PANEL WITH LDL/HDL RATIO
CHOLESTEROL TOTAL: 158 mg/dL (ref 100–199)
HDL: 48 mg/dL (ref >39)
LDL Calculated: 92 mg/dL (ref 0–99)
LDl/HDL Ratio: 1.9 ratio (ref 0.0–3.2)
Triglycerides: 92 mg/dL (ref 0–149)
VLDL Cholesterol Cal: 18 mg/dL (ref 5–40)

## 2017-09-05 LAB — VITAMIN D 25 HYDROXY (VIT D DEFICIENCY, FRACTURES): VIT D 25 HYDROXY: 37.2 ng/mL (ref 30.0–100.0)

## 2017-09-05 LAB — TSH: TSH: 1.68 u[IU]/mL (ref 0.450–4.500)

## 2017-09-05 LAB — HEMOGLOBIN A1C
ESTIMATED AVERAGE GLUCOSE: 126 mg/dL
Hgb A1c MFr Bld: 6 % — ABNORMAL HIGH (ref 4.8–5.6)

## 2017-09-05 LAB — INSULIN, RANDOM: INSULIN: 16.5 u[IU]/mL (ref 2.6–24.9)

## 2017-09-05 LAB — FOLATE: FOLATE: 19.6 ng/mL (ref >3.0)

## 2017-09-05 LAB — VITAMIN B12: Vitamin B-12: 1274 pg/mL — ABNORMAL HIGH (ref 232–1245)

## 2017-09-05 LAB — T3: T3, Total: 159 ng/dL (ref 71–180)

## 2017-09-05 LAB — T4, FREE: FREE T4: 1.34 ng/dL (ref 0.82–1.77)

## 2017-09-17 ENCOUNTER — Ambulatory Visit (INDEPENDENT_AMBULATORY_CARE_PROVIDER_SITE_OTHER): Payer: PRIVATE HEALTH INSURANCE | Admitting: Family Medicine

## 2017-09-17 VITALS — BP 109/76 | HR 76 | Temp 97.8°F | Ht 61.0 in | Wt 208.0 lb

## 2017-09-17 DIAGNOSIS — E119 Type 2 diabetes mellitus without complications: Secondary | ICD-10-CM | POA: Diagnosis not present

## 2017-09-17 DIAGNOSIS — E559 Vitamin D deficiency, unspecified: Secondary | ICD-10-CM | POA: Diagnosis not present

## 2017-09-17 DIAGNOSIS — Z9189 Other specified personal risk factors, not elsewhere classified: Secondary | ICD-10-CM | POA: Diagnosis not present

## 2017-09-17 DIAGNOSIS — Z6839 Body mass index (BMI) 39.0-39.9, adult: Secondary | ICD-10-CM

## 2017-09-17 MED ORDER — VITAMIN D (ERGOCALCIFEROL) 1.25 MG (50000 UNIT) PO CAPS: 50000.0000 [IU] | ORAL_CAPSULE | ORAL | 0 refills | Status: DC

## 2017-09-17 MED ORDER — METFORMIN HCL 500 MG PO TABS: 500.0000 mg | ORAL_TABLET | Freq: Every day | ORAL | 0 refills | Status: DC

## 2017-09-18 NOTE — Progress Notes (Signed)
Office: [phone]  /  Fax: [phone]   HPI:   Chief Complaint: OBESITY Amber [name] is here to discuss her progress with her obesity treatment plan. She is on the Category 2 plan and is following her eating plan approximately 80 % of the time. She states she is exercising 0 minutes 0 times per week. Amber [name] is doing really well on her meal plan and indulged at 2 parties with African food. She notes some hunger at appropriate times. She is skipping lunch meal most days, using crackers and cheese for snacks.  Her weight is 208 lb (94.3 kg) today and has had a weight loss of 3 pounds over a period of 2 weeks since her last visit. She has lost 3 lbs since starting treatment with [place].  Vitamin D Deficiency Amber [name] has a diagnosis of vitamin D deficiency. She is not currently taking Vit D. She notes fatigue and denies nausea, vomiting or muscle weakness.  At risk for osteopenia and osteoporosis Amber [name] is at higher risk of osteopenia and osteoporosis due to vitamin D deficiency.   Diabetes II Amber [name] has a diagnosis of diabetes type II. Amber [name] has had diagnosis for at least 7 years (pre-diabetic range). She denies any hypoglycemic episodes. Last A1c was 6.0. She has been working on intensive lifestyle modifications including diet, exercise, and weight loss to help control her blood glucose levels.  ALLERGIES: No Known Allergies  MEDICATIONS: Current Outpatient Medications on File Prior to Visit  Medication Sig Dispense Refill  . albuterol (PROVENTIL HFA;VENTOLIN HFA) 108 (90 Base) MCG/ACT inhaler Inhale 2 puffs into the lungs every 6 (six) hours as needed for wheezing or shortness of breath. 1 Inhaler 2  . aspirin EC 81 MG tablet Take 1 tablet (81 mg total) by mouth daily.    . Cyanocobalamin (VITAMIN B-12) 5000 MCG LOZG Take 1 tablet by mouth 2 (two) times daily.    . ferrous sulfate 325 (65 FE) MG tablet Take 325 mg by mouth 3 (three) times daily with meals.     .  fluticasone (FLONASE) 50 MCG/ACT nasal spray Place 2 sprays into both nostrils daily. 16 g 1  . Fluticasone-Salmeterol (ADVAIR DISKUS) 250-50 MCG/DOSE AEPB Inhale 1 puff into the lungs 2 (two) times daily. 1 each 3  . furosemide (LASIX) 20 MG tablet TAKE ONE TABLET BY MOUTH ONCE DAILY AS NEEDED FOR  FLUID 90 tablet 1  . lisinopril (PRINIVIL,ZESTRIL) 40 MG tablet TAKE 1 TABLET BY MOUTH ONCE DAILY 30 tablet 2  . metoprolol succinate (TOPROL-XL) 100 MG 24 hr tablet Take 1 tablet by mouth daily in the AM (take with or immediately following a meal) (Patient taking differently: 150 mg. Take 1 tablet by mouth daily in the AM (take with or immediately following a meal)) 90 tablet 1  . ondansetron (ZOFRAN) 4 MG tablet Take 1 tablet (4 mg total) by mouth every 8 (eight) hours as needed for nausea or vomiting. 20 tablet 0  . SF 5000 PLUS 1.1 % CREA dental cream As directed.    . SUMAtriptan (IMITREX) 50 MG tablet TAKE ONE TABLET BY MOUTH EVERY 2 HOURS AS NEEDED FOR  MIGRAINE  MAY  REPEAT  IN  2  HOURS  IF  HEADACHE  PERSISTS  OR  RECURS 9 tablet 5  . TRAVATAN Z 0.004 % SOLN ophthalmic solution Place 1 drop into both eyes at bedtime.     Marland Kitchen atorvastatin (LIPITOR) 40 MG tablet Take 1 tablet (40 mg total) by mouth daily. 90 tablet  3  . diltiazem (CARDIZEM CD) 120 MG 24 hr capsule Take 1 capsule (120 mg total) by mouth daily. 90 capsule 3  . hydrochlorothiazide (HYDRODIURIL) 25 MG tablet Take 1 tablet (25 mg total) by mouth daily. 30 tablet 3   No current facility-administered medications on file prior to visit.     PAST MEDICAL HISTORY: Past Medical History:  Diagnosis Date  . Anemia   . Arthritis   . Blood transfusion without reported diagnosis   . Colon polyps   . Controlled type 2 diabetes mellitus without complication, without long-term current use of insulin ([place]) 05/31/2017  . Ectopic pregnancy    RIGHT salpingostomy  . Elective abortion   . GERD (gastroesophageal reflux disease)   . Glaucoma     both eyes  . Headache    migraines  . History of chicken pox   . Hyperlipemia   . Hypertension   . Rheumatoid arthritis(714.0) 11/07/2011  . SAB (spontaneous abortion)   . Shortness of breath   . Sjogren's disease ([place])   . Wheezing     PAST SURGICAL HISTORY: Past Surgical History:  Procedure Laterality Date  . ABDOMINAL SURGERY     bowel repair  . COLONOSCOPY    . DILATION AND CURETTAGE OF UTERUS     X 2  . MENISCUS REPAIR Right    MENISCUS TEAR REPAIR  . MYOMECTOMY N/A 07/27/2014   Procedure: ABDOMINAL MYOMECTOMY;  Surgeon: Waymon Amato, MD;  Location: [facility];  Service: Gynecology;  Laterality: N/A;  . PELVIC LAPAROSCOPY  1994   IN 1999 LAPAROSCOPY WITH INCIDENTAL ENTEROTOMY REQUIRING LAPAROTOMY  . UTERINE FIBROID SURGERY     July 2016    SOCIAL HISTORY: Social History   Tobacco Use  . Smoking status: Never Smoker  . Smokeless tobacco: Never Used  Substance Use Topics  . Alcohol use: No    Alcohol/week: 0.0 standard drinks  . Drug use: No    FAMILY HISTORY: Family History  Problem Relation Age of Onset  . Hypertension Mother        died from heart disease  . Heart disease Mother        deceased, unknown cause  . Stroke Mother   . Obesity Mother   . Diabetes Sister   . Hypertension Sister   . Esophageal cancer Sister   . Stomach cancer Sister   . Cancer Sister   . Diabetes Sister   . Rectal cancer Neg Hx   . Colon cancer Neg Hx     ROS: Review of Systems  Constitutional: Positive for malaise/fatigue and weight loss.  Gastrointestinal: Negative for nausea and vomiting.  Musculoskeletal:       Negative muscle weakness  Endo/Heme/Allergies:       Negative hypoglycemia    PHYSICAL EXAM: Blood pressure 109/76, pulse 76, temperature 97.8 F (36.6 C), temperature source Oral, height 5\' 1"  (1.549 m), weight 208 lb (94.3 kg), SpO2 98 %. Body mass index is 39.3 kg/m. Physical Exam  Constitutional: She is oriented to person, place, and time. She appears  well-developed and well-nourished.  Cardiovascular: Normal rate.  Pulmonary/Chest: Effort normal.  Musculoskeletal: Normal range of motion.  Neurological: She is oriented to person, place, and time.  Skin: Skin is warm and dry.  Psychiatric: She has a normal mood and affect. Her behavior is normal.  Vitals reviewed.   RECENT LABS AND TESTS: BMET    Component Value Date/Time   NA 140 09/04/2017 1124   NA 138 01/21/2015 0935  K 4.1 09/04/2017 1124   K 3.9 01/21/2015 0935   CL 100 09/04/2017 1124   CL 108 (H) 06/25/2012 1508   CO2 24 09/04/2017 1124   CO2 25 01/21/2015 0935   GLUCOSE 82 09/04/2017 1124   GLUCOSE 109 (H) 05/31/2017 0819   GLUCOSE 128 01/21/2015 0935   GLUCOSE 107 (H) 06/25/2012 1508   BUN 10 09/04/2017 1124   BUN 7.0 01/21/2015 0935   CREATININE 0.60 09/04/2017 1124   CREATININE 0.8 01/21/2015 0935   CALCIUM 9.8 09/04/2017 1124   CALCIUM 9.4 01/21/2015 0935   GFRNONAA 102 09/04/2017 1124   GFRNONAA >89 10/01/2012 1412   GFRAA 118 09/04/2017 1124   GFRAA >89 10/01/2012 1412   Lab Results  Component Value Date   HGBA1C 6.0 (H) 09/04/2017   HGBA1C 6.5 05/31/2017   HGBA1C 6.1 09/06/2016   HGBA1C 5.0 08/31/2014   HGBA1C 6.0 04/09/2014   Lab Results  Component Value Date   INSULIN 16.5 09/04/2017   CBC    Component Value Date/Time   WBC 2.7 (L) 09/03/2017 0813   WBC 3.3 (L) 03/05/2017 0835   RBC 4.50 09/03/2017 0813   HGB 12.9 09/03/2017 0813   HGB 12.4 09/13/2016 0935   HCT 39.3 09/03/2017 0813   HCT 39.6 09/13/2016 0935   PLT 208 09/03/2017 0813   PLT 194 09/13/2016 0935   MCV 87.4 09/03/2017 0813   MCV 87.8 09/13/2016 0935   MCH 28.7 09/03/2017 0813   MCHC 32.8 09/03/2017 0813   RDW 13.4 09/03/2017 0813   RDW 14.4 09/13/2016 0935   LYMPHSABS 1.5 09/03/2017 0813   LYMPHSABS 1.4 09/13/2016 0935   MONOABS 0.2 09/03/2017 0813   MONOABS 0.2 09/13/2016 0935   EOSABS 0.1 09/03/2017 0813   EOSABS 0.1 09/13/2016 0935   BASOSABS 0.0 09/03/2017  0813   BASOSABS 0.0 09/13/2016 0935   Iron/TIBC/Ferritin/ %Sat    Component Value Date/Time   IRON 68 09/03/2017 0812   IRON 79 09/13/2016 0935   TIBC 285 09/03/2017 0812   TIBC 326 09/13/2016 0935   FERRITIN 203 09/03/2017 0812   FERRITIN 40 09/13/2016 0935   IRONPCTSAT 24 09/03/2017 0812   IRONPCTSAT 24 09/13/2016 0935   IRONPCTSAT 14 (L) 06/25/2012 1508   Lipid Panel     Component Value Date/Time   CHOL 158 09/04/2017 1124   TRIG 92 09/04/2017 1124   HDL 48 09/04/2017 1124   CHOLHDL 3 09/06/2016 1029   VLDL 10.6 09/06/2016 1029   LDLCALC 92 09/04/2017 1124   Hepatic Function Panel     Component Value Date/Time   PROT 9.5 (H) 09/04/2017 1124   PROT 9.2 (H) 01/21/2015 0935   ALBUMIN 4.4 09/04/2017 1124   ALBUMIN 3.5 01/21/2015 0935   AST 32 09/04/2017 1124   AST 17 01/21/2015 0935   ALT 32 09/04/2017 1124   ALT 15 01/21/2015 0935   ALKPHOS 55 09/04/2017 1124   ALKPHOS 54 01/21/2015 0935   BILITOT 1.0 09/04/2017 1124   BILITOT 0.55 01/21/2015 0935   BILIDIR 0.2 05/14/2016 1227      Component Value Date/Time   TSH 1.680 09/04/2017 1124   TSH 1.13 09/06/2016 1029   TSH 0.77 05/14/2016 1227  Results for DEIONA, HOOPER (MRN [id number]) as of 09/18/2017 13:48  Ref. Range 09/04/2017 11:24  Vitamin D, 25-Hydroxy Latest Ref Range: 30.0 - 100.0 ng/mL 37.2    ASSESSMENT AND PLAN: Vitamin D deficiency - Plan: Vitamin D, Ergocalciferol, (DRISDOL) 50000 units CAPS capsule  Type 2 diabetes  mellitus without complication, without long-term current use of insulin ([place]) - Plan: metFORMIN (GLUCOPHAGE) 500 MG tablet  At risk for diabetes mellitus  Class 2 severe obesity with serious comorbidity and body mass index (BMI) of 39.0 to 39.9 in adult, unspecified obesity type ([place])  PLAN:  Vitamin D Deficiency Fartun was informed that low vitamin D levels contributes to fatigue and are associated with obesity, breast, and colon cancer. Alasia agrees to start  prescription Vit D @50 ,000 IU every week #4 with no refills. She will follow up for routine testing of vitamin D, at least 2-3 times per year. She was informed of the risk of over-replacement of vitamin D and agrees to not increase her dose unless she discusses this with [place] first. Willadene agrees to follow up with our clinic in 2 weeks.  At risk for osteopenia and osteoporosis Gracianna was given extended  (15 minutes) osteoporosis prevention counseling today. Thula is at risk for osteopenia and osteoporsis due to her vitamin D deficiency. She was encouraged to take her vitamin D and follow her higher calcium diet and increase strengthening exercise to help strengthen her bones and decrease her risk of osteopenia and osteoporosis.  Diabetes II Guliana has been given extensive diabetes education by myself today including ideal fasting and post-prandial blood glucose readings, individual ideal Hgb A1c goals and hypoglycemia prevention. We discussed the importance of good blood sugar control to decrease the likelihood of diabetic complications such as nephropathy, neuropathy, limb loss, blindness, coronary artery disease, and death. We discussed the importance of intensive lifestyle modification including diet, exercise and weight loss as the first line treatment for diabetes. Sheritta agrees to start metformin 500 mg PO q AM #30 with no refills. Kourtney agrees to follow up with our clinic in 2 weeks.  Obesity Britain is currently in the action stage of change. As such, her goal is to continue with weight loss efforts She has agreed to follow the Category 2 plan Eller has been instructed to work up to a goal of 150 minutes of combined cardio and strengthening exercise per week for weight loss and overall health benefits. We discussed the following Behavioral Modification Strategies today: increasing lean protein intake, increasing vegetables, work on meal planning and easy cooking  plans, and planning for success   Denetra has agreed to follow up with our clinic in 2 weeks. She was informed of the importance of frequent follow up visits to maximize her success with intensive lifestyle modifications for her multiple health conditions.   OBESITY BEHAVIORAL INTERVENTION VISIT  Today's visit was # 2   Starting weight: 211 lbs Starting date: 09/04/17 Today's weight : 208 lbs  Today's date: 09/17/2017 Total lbs lost to date: 3    ASK: We discussed the diagnosis of obesity with Brigitte Pulse [place] today and Rayven agreed to give [place] permission to discuss obesity behavioral modification therapy today.  ASSESS: Jalena has the diagnosis of obesity and her BMI today is 39.32 Otis is in the action stage of change   ADVISE: Tiffiny was educated on the multiple health risks of obesity as well as the benefit of weight loss to improve her health. She was advised of the need for long term treatment and the importance of lifestyle modifications to improve her current health and to decrease her risk of future health problems.  AGREE: Multiple dietary modification options and treatment options were discussed and  Nivea agreed to follow the recommendations documented in the above note.  ARRANGE: Bobbijo was  educated on the importance of frequent visits to treat obesity as outlined per CMS and USPSTF guidelines and agreed to schedule her next follow up appointment today.  I, Trixie Dredge, am acting as transcriptionist for Ilene Qua, MD  I have reviewed the above documentation for accuracy and completeness, and I agree with the above. - Ilene Qua, MD

## 2017-09-20 ENCOUNTER — Encounter: Payer: Self-pay | Admitting: Medical

## 2017-09-20 ENCOUNTER — Ambulatory Visit: Payer: PRIVATE HEALTH INSURANCE | Admitting: Medical

## 2017-09-20 VITALS — BP 134/88 | HR 74 | Temp 98.1°F | Resp 16 | Ht 61.0 in | Wt 211.4 lb

## 2017-09-20 DIAGNOSIS — J0100 Acute maxillary sinusitis, unspecified: Secondary | ICD-10-CM

## 2017-09-20 DIAGNOSIS — H6690 Otitis media, unspecified, unspecified ear: Secondary | ICD-10-CM | POA: Diagnosis not present

## 2017-09-20 DIAGNOSIS — J40 Bronchitis, not specified as acute or chronic: Secondary | ICD-10-CM

## 2017-09-20 DIAGNOSIS — M791 Myalgia, unspecified site: Secondary | ICD-10-CM | POA: Diagnosis not present

## 2017-09-20 LAB — POCT INFLUENZA A/B
Influenza A, POC: NEGATIVE
Influenza B, POC: NEGATIVE

## 2017-09-20 MED ORDER — AMOXICILLIN-POT CLAVULANATE 875-125 MG PO TABS: 1.0000 | ORAL_TABLET | Freq: Two times a day (BID) | ORAL | 0 refills | Status: DC

## 2017-09-20 MED ORDER — BENZONATATE 100 MG PO CAPS: 100.0000 mg | ORAL_CAPSULE | Freq: Three times a day (TID) | ORAL | 0 refills | Status: DC | PRN

## 2017-09-20 MED ORDER — FLUTICASONE PROPIONATE 50 MCG/ACT NA SUSP: 2.0000 | Freq: Every day | NASAL | 1 refills | Status: DC

## 2017-09-20 NOTE — Patient Instructions (Signed)
You appear to have bronchitis and sinusitis(more sinus infection). Also on exam left side ear infection present.  Rest hydrate and tylenol for fever. I am prescribing cough medicine benzonatate, and augmentin antibiotic. For your nasal congestion rx flonase  You should gradually get better. If not then notify us and would recommend a chest xray.Not indicated today.  Your flu test was negative. Low clinical suspicion and to believe accuracy of test today.  Follow up in 7-10 days or as needed

## 2017-09-20 NOTE — Progress Notes (Signed)
Subjective:    Patient ID: Amber [name], female    DOB: Feb 27, 1960, 57 y.o.   MRN: [id number]  HPI  Pt in feeling sick since Tuesday night. She has both nasal and chest congestion. She is blowing out mucus from her nose and coughing up mucus. She has sinus pressure and some left ear pain.   Pt states low grade temp since Wednesday.   Pt cough is disrupting her sleep.  Pt tried tylenol cold but did not help.   Review of Systems  Constitutional: Positive for fatigue and fever. Negative for chills.  HENT: Positive for congestion, sinus pressure and sinus pain. Negative for sore throat.   Respiratory: Positive for cough. Negative for chest tightness, shortness of breath and wheezing.   Cardiovascular: Negative for chest pain and palpitations.  Gastrointestinal: Negative for abdominal pain.  Genitourinary: Negative for difficulty urinating and dysuria.  Musculoskeletal: Negative for back pain.       Faint/mild myaglias. Started today.  Neurological: Negative for dizziness, numbness and headaches.  Hematological: Negative for adenopathy. Does not bruise/bleed easily.  Psychiatric/Behavioral: Negative for behavioral problems and confusion.   Past Medical History:  Diagnosis Date  . Anemia   . Arthritis   . Blood transfusion without reported diagnosis   . Colon polyps   . Controlled type 2 diabetes mellitus without complication, without long-term current use of insulin ([place]) 05/31/2017  . Ectopic pregnancy    RIGHT salpingostomy  . Elective abortion   . GERD (gastroesophageal reflux disease)   . Glaucoma    both eyes  . Headache    migraines  . History of chicken pox   . Hyperlipemia   . Hypertension   . Rheumatoid arthritis(714.0) 11/07/2011  . SAB (spontaneous abortion)   . Shortness of breath   . Sjogren's disease ([place])   . Wheezing      Social History   Socioeconomic History  . Marital status: Single    Spouse name: Not on file  . Number of children: 0  .  Years of education: Not on file  . Highest education level: Not on file  Occupational History  . Occupation: LPN    Employer: [employer]  Social Needs  . Financial resource strain: Not on file  . Food insecurity:    Worry: Not on file    Inability: Not on file  . Transportation needs:    Medical: Not on file    Non-medical: Not on file  Tobacco Use  . Smoking status: Never Smoker  . Smokeless tobacco: Never Used  Substance and Sexual Activity  . Alcohol use: No    Alcohol/week: 0.0 standard drinks  . Drug use: No  . Sexual activity: Yes    Birth control/protection: None    Comment: 1st intercourse- 82, partners- 5  Lifestyle  . Physical activity:    Days per week: Not on file    Minutes per session: Not on file  . Stress: Not on file  Relationships  . Social connections:    Talks on phone: Not on file    Gets together: Not on file    Attends religious service: Not on file    Active member of club or organization: Not on file    Attends meetings of clubs or organizations: Not on file    Relationship status: Not on file  . Intimate partner violence:    Fear of current or ex partner: Not on file    Emotionally abused: Not on file  Physically abused: Not on file    Forced sexual activity: Not on file  Other Topics Concern  . 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  Had a sister up in [place] who passed away 04-03-2015 who she was close to.   Born [place]- she came to [place] as adult.  Age 45.           Past Surgical History:  Procedure Laterality Date  . ABDOMINAL SURGERY     bowel repair  . COLONOSCOPY    . DILATION AND CURETTAGE OF UTERUS     X 2  . MENISCUS REPAIR Right    MENISCUS TEAR REPAIR  . MYOMECTOMY N/A 07/27/2014   Procedure: ABDOMINAL MYOMECTOMY;  Surgeon: Waymon Amato,  MD;  Location: [facility];  Service: Gynecology;  Laterality: N/A;  . PELVIC LAPAROSCOPY  1994   IN 1999 LAPAROSCOPY WITH INCIDENTAL ENTEROTOMY REQUIRING LAPAROTOMY  . UTERINE FIBROID SURGERY     July 2016    Family History  Problem Relation Age of Onset  . Hypertension Mother        died from heart disease  . Heart disease Mother        deceased, unknown cause  . Stroke Mother   . Obesity Mother   . Diabetes Sister   . Hypertension Sister   . Esophageal cancer Sister   . Stomach cancer Sister   . Cancer Sister   . Diabetes Sister   . Rectal cancer Neg Hx   . Colon cancer Neg Hx     No Known Allergies  Current Outpatient Medications on File Prior to Visit  Medication Sig Dispense Refill  . albuterol (PROVENTIL HFA;VENTOLIN HFA) 108 (90 Base) MCG/ACT inhaler Inhale 2 puffs into the lungs every 6 (six) hours as needed for wheezing or shortness of breath. 1 Inhaler 2  . aspirin EC 81 MG tablet Take 1 tablet (81 mg total) by mouth daily.    . Cyanocobalamin (VITAMIN B-12) 5000 MCG LOZG Take 1 tablet by mouth 2 (two) times daily.    . ferrous sulfate 325 (65 FE) MG tablet Take 325 mg by mouth 3 (three) times daily with meals.     . fluticasone (FLONASE) 50 MCG/ACT nasal spray Place 2 sprays into both nostrils daily. 16 g 1  . Fluticasone-Salmeterol (ADVAIR DISKUS) 250-50 MCG/DOSE AEPB Inhale 1 puff into the lungs 2 (two) times daily. 1 each 3  . furosemide (LASIX) 20 MG tablet TAKE ONE TABLET BY MOUTH ONCE DAILY AS NEEDED FOR  FLUID 90 tablet 1  . lisinopril (PRINIVIL,ZESTRIL) 40 MG tablet TAKE 1 TABLET BY MOUTH ONCE DAILY 30 tablet 2  . metFORMIN (GLUCOPHAGE) 500 MG tablet Take 1 tablet (500 mg total) by mouth daily with breakfast. 30 tablet 0  . metoprolol succinate (TOPROL-XL) 100 MG 24 hr tablet Take 1 tablet by mouth daily in the AM (take with or immediately following a meal) (Patient taking differently: 150 mg. Take 1 tablet by mouth daily in the AM (take with or immediately  following a meal)) 90 tablet 1  . ondansetron (ZOFRAN) 4 MG tablet Take 1 tablet (4 mg total) by mouth every 8 (eight) hours as needed for nausea or vomiting. 20 tablet 0  . SF 5000 PLUS  1.1 % CREA dental cream As directed.    . SUMAtriptan (IMITREX) 50 MG tablet TAKE ONE TABLET BY MOUTH EVERY 2 HOURS AS NEEDED FOR  MIGRAINE  MAY  REPEAT  IN  2  HOURS  IF  HEADACHE  PERSISTS  OR  RECURS 9 tablet 5  . TRAVATAN Z 0.004 % SOLN ophthalmic solution Place 1 drop into both eyes at bedtime.     . Vitamin D, Ergocalciferol, (DRISDOL) 50000 units CAPS capsule Take 1 capsule (50,000 Units total) by mouth every 7 (seven) days. 4 capsule 0  . atorvastatin (LIPITOR) 40 MG tablet Take 1 tablet (40 mg total) by mouth daily. 90 tablet 3  . diltiazem (CARDIZEM CD) 120 MG 24 hr capsule Take 1 capsule (120 mg total) by mouth daily. 90 capsule 3  . hydrochlorothiazide (HYDRODIURIL) 25 MG tablet Take 1 tablet (25 mg total) by mouth daily. 30 tablet 3   No current facility-administered medications on file prior to visit.     BP 134/88   Pulse 74   Temp 98.1 F (36.7 C) (Oral)   Resp 16   Ht 5\' 1"  (1.549 m)   Wt 211 lb 6.4 oz (95.9 kg)   SpO2 100%   BMI 39.94 kg/m       Objective:   Physical Exam  General  Mental Status - Alert. General Appearance - Well groomed. Not in acute distress.  Skin Rashes- No Rashes.  HEENT Head- Normal. Ear Auditory Canal - Left- Normal. Right - Normal.Tympanic Membrane- Left- red tm. Right- Normal. Eye Sclera/Conjunctiva- Left- Normal. Right- Normal. Nose & Sinuses Nasal Mucosa- Left-  Boggy and Congested. Right-  Boggy and  Congested.Bilateral maxillary and frontal sinus pressure. Mouth & Throat Lips: Upper Lip- Normal: no dryness, cracking, pallor, cyanosis, or vesicular eruption. Lower Lip-Normal: no dryness, cracking, pallor, cyanosis or vesicular eruption. Buccal Mucosa- Bilateral- No Aphthous ulcers. Oropharynx- No Discharge or Erythema. Tonsils:  Characteristics- Bilateral- No Erythema or Congestion. Size/Enlargement- Bilateral- No enlargement. Discharge- bilateral-None.  Neck Neck- Supple. No Masses.   Chest and Lung Exam Auscultation: Breath Sounds:-Clear even and unlabored.  Cardiovascular Auscultation:Rythm- Regular, rate and rhythm. Murmurs & Other Heart Sounds:Ausculatation of the heart reveal- No Murmurs.  Lymphatic Head & Neck General Head & Neck Lymphatics: Bilateral: Description- No Localized lymphadenopathy.       Assessment & Plan:   You appear to have bronchitis and sinusitis(more sinus infection). Also on exam left side ear infection present.  Rest hydrate and tylenol for fever. I am prescribing cough medicine benzonatate, and augmentin antibiotic. For your nasal congestion rx flonase  You should gradually get better. If not then notify us and would recommend a chest xray.Not indicated today.  Your flu test was negative. Low clinical suspicion and to believe accuracy of test today.  Follow up in 7-10 days or as needed  [employer], [employer]

## 2017-09-23 ENCOUNTER — Ambulatory Visit: Payer: PRIVATE HEALTH INSURANCE | Admitting: Family

## 2017-09-30 ENCOUNTER — Ambulatory Visit (INDEPENDENT_AMBULATORY_CARE_PROVIDER_SITE_OTHER): Payer: PRIVATE HEALTH INSURANCE | Admitting: Family Medicine

## 2017-09-30 ENCOUNTER — Other Ambulatory Visit (HOSPITAL_COMMUNITY)
Admission: RE | Admit: 2017-09-30 | Discharge: 2017-09-30 | Disposition: A | Discharge status: Home / Self Care | Payer: PRIVATE HEALTH INSURANCE | Source: Ambulatory Visit | Attending: Family | Admitting: Family

## 2017-09-30 ENCOUNTER — Ambulatory Visit (INDEPENDENT_AMBULATORY_CARE_PROVIDER_SITE_OTHER): Payer: PRIVATE HEALTH INSURANCE | Admitting: Family

## 2017-09-30 ENCOUNTER — Encounter: Payer: Self-pay | Admitting: Family

## 2017-09-30 VITALS — BP 150/88 | HR 82 | Temp 98.8°F | Resp 16 | Ht 61.0 in | Wt 210.8 lb

## 2017-09-30 DIAGNOSIS — Z23 Encounter for immunization: Secondary | ICD-10-CM | POA: Diagnosis not present

## 2017-09-30 DIAGNOSIS — Z01419 Encounter for gynecological examination (general) (routine) without abnormal findings: Secondary | ICD-10-CM | POA: Diagnosis not present

## 2017-09-30 DIAGNOSIS — Z0000 Encounter for general adult medical examination without abnormal findings: Secondary | ICD-10-CM | POA: Diagnosis not present

## 2017-09-30 NOTE — Progress Notes (Signed)
Subjective:    Patient ID: Amber [name], female    DOB: 1960-04-20, 57 y.o.   MRN: [id number]  HPI  Patient presents today for complete physical.  Immunizations: would like shingrix. Tetanus up to date flu shot today Diet: just started at weight loss clinic. Has been working on diet Exercise:  Enjoys walking Colonoscopy: 2013- due for 5 yr follow up per patient.  Dexa: due Pap Smear: due Mammogram: due Dental: up to date Vision: up to date  Wt Readings from Last 3 Encounters:  09/30/17 210 lb 12.8 oz (95.6 kg)  09/20/17 211 lb 6.4 oz (95.9 kg)  09/17/17 208 lb (94.3 kg)      Review of Systems  Constitutional: Negative for unexpected weight change.  HENT: Negative for hearing loss and rhinorrhea.   Eyes: Negative for visual disturbance.  Respiratory: Negative for shortness of breath.        Reports resolving cough from recent URI  Cardiovascular: Negative for leg swelling.  Gastrointestinal: Negative for blood in stool, constipation and diarrhea.  Genitourinary: Negative for dysuria, frequency and hematuria.  Musculoskeletal: Positive for arthralgias. Negative for back pain.  Skin: Negative for rash.  Neurological: Positive for headaches.  Hematological: Negative for adenopathy.  Psychiatric/Behavioral:       Denies depression/anxiety   Past Medical History:  Diagnosis Date  . Anemia   . Arthritis   . Blood transfusion without reported diagnosis   . Colon polyps   . Controlled type 2 diabetes mellitus without complication, without long-term current use of insulin ([place]) 05/31/2017  . Ectopic pregnancy    RIGHT salpingostomy  . Elective abortion   . GERD (gastroesophageal reflux disease)   . Glaucoma    both eyes  . Headache    migraines  . History of chicken pox   . Hyperlipemia   . Hypertension   . Rheumatoid arthritis(714.0) 11/07/2011  . SAB (spontaneous abortion)   . Shortness of breath   . Sjogren's disease ([place])   . Wheezing      Social  History   Socioeconomic History  . Marital status: Single    Spouse name: Not on file  . Number of children: 0  . Years of education: Not on file  . Highest education level: Not on file  Occupational History  . Occupation: LPN    Employer: [employer]  Social Needs  . Financial resource strain: Not on file  . Food insecurity:    Worry: Not on file    Inability: Not on file  . Transportation needs:    Medical: Not on file    Non-medical: Not on file  Tobacco Use  . Smoking status: Never Smoker  . Smokeless tobacco: Never Used  Substance and Sexual Activity  . Alcohol use: No    Alcohol/week: 0.0 standard drinks  . Drug use: No  . Sexual activity: Yes    Birth control/protection: None    Comment: 1st intercourse- 69, partners- 5  Lifestyle  . Physical activity:    Days per week: Not on file    Minutes per session: Not on file  . Stress: Not on file  Relationships  . Social connections:    Talks on phone: Not on file    Gets together: Not on file    Attends religious service: Not on file    Active member of club or organization: Not on file    Attends meetings of clubs or organizations: Not on file    Relationship status:  Not on file  . Intimate partner violence:    Fear of current or ex partner: Not on file    Emotionally abused: Not on file    Physically abused: Not on file    Forced sexual activity: Not on file  Other Topics Concern  . 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  Had a sister up in [place] who passed away April 04, 2015 who she was close to.   Born [place]- she came to [place] as adult.  Age 67.           Past Surgical History:  Procedure Laterality Date  . ABDOMINAL SURGERY     bowel repair  . COLONOSCOPY    . DILATION AND CURETTAGE OF UTERUS     X 2  . MENISCUS  REPAIR Right    MENISCUS TEAR REPAIR  . MYOMECTOMY N/A 07/27/2014   Procedure: ABDOMINAL MYOMECTOMY;  Surgeon: Waymon Amato, MD;  Location: [facility];  Service: Gynecology;  Laterality: N/A;  . PELVIC LAPAROSCOPY  1994   IN 1999 LAPAROSCOPY WITH INCIDENTAL ENTEROTOMY REQUIRING LAPAROTOMY  . UTERINE FIBROID SURGERY     July 2016    Family History  Problem Relation Age of Onset  . Hypertension Mother        died from heart disease  . Heart disease Mother        deceased, unknown cause  . Stroke Mother   . Obesity Mother   . Diabetes Sister   . Hypertension Sister   . Esophageal cancer Sister   . Stomach cancer Sister   . Cancer Sister   . Diabetes Sister   . Rectal cancer Neg Hx   . Colon cancer Neg Hx     No Known Allergies  Current Outpatient Medications on File Prior to Visit  Medication Sig Dispense Refill  . albuterol (PROVENTIL HFA;VENTOLIN HFA) 108 (90 Base) MCG/ACT inhaler Inhale 2 puffs into the lungs every 6 (six) hours as needed for wheezing or shortness of breath. 1 Inhaler 2  . aspirin EC 81 MG tablet Take 1 tablet (81 mg total) by mouth daily.    . Cyanocobalamin (VITAMIN B-12) 5000 MCG LOZG Take 1 tablet by mouth 2 (two) times daily.    . ferrous sulfate 325 (65 FE) MG tablet Take 325 mg by mouth 3 (three) times daily with meals.     . fluticasone (FLONASE) 50 MCG/ACT nasal spray Place 2 sprays into both nostrils daily. 16 g 1  . Fluticasone-Salmeterol (ADVAIR DISKUS) 250-50 MCG/DOSE AEPB Inhale 1 puff into the lungs 2 (two) times daily. 1 each 3  . furosemide (LASIX) 20 MG tablet TAKE ONE TABLET BY MOUTH ONCE DAILY AS NEEDED FOR  FLUID 90 tablet 1  . lisinopril (PRINIVIL,ZESTRIL) 40 MG tablet TAKE 1 TABLET BY MOUTH ONCE DAILY 30 tablet 2  . metFORMIN (GLUCOPHAGE) 500 MG tablet Take 1 tablet (500 mg total) by mouth daily with breakfast. 30 tablet 0  . metoprolol succinate (TOPROL-XL) 100 MG 24 hr tablet Take 1 tablet by mouth daily in the AM (take with or immediately  following a meal) (Patient taking differently: 150 mg. Take 1 tablet by mouth daily in the AM (take with or immediately following a meal)) 90 tablet 1  .  ondansetron (ZOFRAN) 4 MG tablet Take 1 tablet (4 mg total) by mouth every 8 (eight) hours as needed for nausea or vomiting. 20 tablet 0  . SF 5000 PLUS 1.1 % CREA dental cream As directed.    . SUMAtriptan (IMITREX) 50 MG tablet TAKE ONE TABLET BY MOUTH EVERY 2 HOURS AS NEEDED FOR  MIGRAINE  MAY  REPEAT  IN  2  HOURS  IF  HEADACHE  PERSISTS  OR  RECURS 9 tablet 5  . TRAVATAN Z 0.004 % SOLN ophthalmic solution Place 1 drop into both eyes at bedtime.     . Vitamin D, Ergocalciferol, (DRISDOL) 50000 units CAPS capsule Take 1 capsule (50,000 Units total) by mouth every 7 (seven) days. 4 capsule 0  . atorvastatin (LIPITOR) 40 MG tablet Take 1 tablet (40 mg total) by mouth daily. 90 tablet 3  . diltiazem (CARDIZEM CD) 120 MG 24 hr capsule Take 1 capsule (120 mg total) by mouth daily. 90 capsule 3  . hydrochlorothiazide (HYDRODIURIL) 25 MG tablet Take 1 tablet (25 mg total) by mouth daily. 30 tablet 3   No current facility-administered medications on file prior to visit.     BP (!) 150/88 (BP Location: Right Arm, Patient Position: Sitting, Cuff Size: Large)   Pulse 82   Temp 98.8 F (37.1 C) (Oral)   Resp 16   Ht 5\' 1"  (1.549 m)   Wt 210 lb 12.8 oz (95.6 kg)   SpO2 97%   BMI 39.83 kg/m       Objective:   Physical Exam  Physical Exam  Constitutional: She is oriented to person, place, and time. She appears well-developed and well-nourished. No distress.  HENT:  Head: Normocephalic and atraumatic.  Right Ear: Tympanic membrane and ear canal normal.  Left Ear: Tympanic membrane and ear canal normal.  Mouth/Throat: Oropharynx is clear and moist.  Eyes: Pupils are equal, round, and reactive to light. No scleral icterus.  Neck: Normal range of motion. No thyromegaly present.  Cardiovascular: Normal rate and regular rhythm.   No murmur  heard. Pulmonary/Chest: Effort normal and breath sounds normal. No respiratory distress. He has no wheezes. She has no rales. She exhibits no tenderness.  Abdominal: Soft. Bowel sounds are normal. She exhibits no distension and no mass. There is no tenderness. There is no rebound and no guarding.  Musculoskeletal: She exhibits no edema.  Lymphadenopathy:    She has no cervical adenopathy.  Neurological: She is alert and oriented to person, place, and time. She has normal patellar reflexes. She exhibits normal muscle tone. Coordination normal.  Skin: Skin is warm and dry.  Psychiatric: She has a normal mood and affect. Her behavior is normal. Judgment and thought content normal.  Breasts: Examined lying Right: Without masses, retractions, discharge or axillary adenopathy.  Left: Without masses, retractions, discharge or axillary adenopathy.  Inguinal/mons: Normal without inguinal adenopathy  External genitalia: Normal  BUS/Urethra/Skene's glands: Normal  Bladder: Normal  Vagina: Normal  Cervix: Normal  Uterus: normal in size, shape and contour. Midline and mobile  Adnexa/parametria:  Rt: Without masses or tenderness.  Lt: Without masses or tenderness.  Anus and perineum: Normal            Assessment & Plan:         Assessment & Plan:  Preventative care- discussed diet, exercise, weight loss.  She would like to start shingles however we are out of the vaccine at this time. Tetanus and flu shot up to date. Refer for  mammo/dexa and colol. EKG tracing is personally reviewed.  EKG notes NSR.  No acute changes.   HTN- bp is elevated today however she did not take AM meds. Plan to recheck bp in 3 months. BP Readings from Last 3 Encounters:  09/30/17 (!) 150/88  09/20/17 134/88  09/17/17 109/76

## 2017-09-30 NOTE — Patient Instructions (Signed)
Please continue to work on healthy diet, exercise and weight loss. You should be contacted about your referral to GI for colonoscopy. Please schedule mammogram and bone density in imaging on the first floor. Complete lab work prior to leaving.

## 2017-10-01 LAB — CYTOLOGY - PAP
Adequacy: ABSENT
DIAGNOSIS: NEGATIVE
HPV: NOT DETECTED

## 2017-10-04 ENCOUNTER — Ambulatory Visit (INDEPENDENT_AMBULATORY_CARE_PROVIDER_SITE_OTHER): Payer: PRIVATE HEALTH INSURANCE

## 2017-10-04 ENCOUNTER — Ambulatory Visit (HOSPITAL_BASED_OUTPATIENT_CLINIC_OR_DEPARTMENT_OTHER)
Admission: RE | Admit: 2017-10-04 | Discharge: 2017-10-04 | Disposition: A | Discharge status: Home / Self Care | Payer: PRIVATE HEALTH INSURANCE | Source: Ambulatory Visit | Attending: Family | Admitting: Family

## 2017-10-04 ENCOUNTER — Encounter: Payer: Self-pay | Admitting: Family

## 2017-10-04 ENCOUNTER — Telehealth: Payer: Self-pay | Admitting: Family

## 2017-10-04 DIAGNOSIS — Z23 Encounter for immunization: Secondary | ICD-10-CM

## 2017-10-04 DIAGNOSIS — Z0000 Encounter for general adult medical examination without abnormal findings: Secondary | ICD-10-CM

## 2017-10-04 NOTE — Telephone Encounter (Signed)
Copied from [place] [id number]. Topic: Quick Communication - See Telephone Encounter >> Oct 04, 2017 10:43 AM Rosalin Hawking wrote: CRM for notification. See Telephone encounter for: 10/04/17.   Pt came in office stating that is going to travel out of country during the month of December and pt would like to know if she can get her international shots. If ok with provider pt already has an appt in December for her 2nd dose for shingles, pt can have it done during that appt but wanting to have it approved by provider. Please advise pt (tel [phone]) if ok to have it done and what shots is she needing.

## 2017-10-04 NOTE — Telephone Encounter (Signed)
I need to know where she is traveling to, when she leaves and for how long she will be there please.

## 2017-10-04 NOTE — Telephone Encounter (Signed)
Please advise 

## 2017-10-07 ENCOUNTER — Ambulatory Visit (INDEPENDENT_AMBULATORY_CARE_PROVIDER_SITE_OTHER): Payer: PRIVATE HEALTH INSURANCE | Admitting: Family Medicine

## 2017-10-07 VITALS — BP 133/82 | HR 70 | Temp 98.4°F | Ht 61.0 in | Wt 204.0 lb

## 2017-10-07 DIAGNOSIS — E559 Vitamin D deficiency, unspecified: Secondary | ICD-10-CM | POA: Diagnosis not present

## 2017-10-07 DIAGNOSIS — E119 Type 2 diabetes mellitus without complications: Secondary | ICD-10-CM

## 2017-10-07 DIAGNOSIS — Z6838 Body mass index (BMI) 38.0-38.9, adult: Secondary | ICD-10-CM

## 2017-10-07 DIAGNOSIS — Z9189 Other specified personal risk factors, not elsewhere classified: Secondary | ICD-10-CM

## 2017-10-07 DIAGNOSIS — R7303 Prediabetes: Secondary | ICD-10-CM

## 2017-10-07 MED ORDER — METFORMIN HCL 500 MG PO TABS: 500.0000 mg | ORAL_TABLET | Freq: Every day | ORAL | 0 refills | Status: DC

## 2017-10-07 NOTE — Telephone Encounter (Signed)
Please contact pt and let her kno

## 2017-10-07 NOTE — Telephone Encounter (Signed)
Patient going to [place] in [place] for 6 weeks.  Departure date is 12-12-17.

## 2017-10-07 NOTE — Progress Notes (Signed)
Office: [phone]  /  Fax: [phone]   HPI:   Chief Complaint: OBESITY Amber [name] is here to discuss her progress with her obesity treatment plan. She is on the Category 2 plan and is following her eating plan approximately 75 to 80 % of the time. She states she is walking on the treadmill 60 minutes 3 times per week. Holly is still adjusting to frozen foods. She is not always fitting in the snacks. She has bought snacks now to try to fit them in.  Her weight is 204 lb (92.5 kg) today and has had a weight loss of 4 pounds over a period of 2 weeks since her last visit. She has lost 7 lbs since starting treatment with [place].  Vitamin D deficiency Amber [name] has a diagnosis of vitamin D deficiency. She is currently taking vit D. She admits fatigue and denies nausea, vomiting or muscle weakness.  Pre-Diabetes Amber [name] has a diagnosis of pre-diabetes based on her elevated Hgb A1c and was informed this puts her at greater risk of developing diabetes. She is taking metformin currently and continues to work on diet and exercise to decrease risk of diabetes. She denies carb cravings and GI side effects.  At risk for diabetes Amber [name] is at higher than average risk for developing diabetes due to pre-diabetes and obesity.   ALLERGIES: No Known Allergies  MEDICATIONS: Current Outpatient Medications on File Prior to Visit  Medication Sig Dispense Refill  . albuterol (PROVENTIL HFA;VENTOLIN HFA) 108 (90 Base) MCG/ACT inhaler Inhale 2 puffs into the lungs every 6 (six) hours as needed for wheezing or shortness of breath. 1 Inhaler 2  . aspirin EC 81 MG tablet Take 1 tablet (81 mg total) by mouth daily.    . Cyanocobalamin (VITAMIN B-12) 5000 MCG LOZG Take 1 tablet by mouth 2 (two) times daily.    . ferrous sulfate 325 (65 FE) MG tablet Take 325 mg by mouth 3 (three) times daily with meals.     . fluticasone (FLONASE) 50 MCG/ACT nasal spray Place 2 sprays into both nostrils daily. 16 g 1   . Fluticasone-Salmeterol (ADVAIR DISKUS) 250-50 MCG/DOSE AEPB Inhale 1 puff into the lungs 2 (two) times daily. 1 each 3  . furosemide (LASIX) 20 MG tablet TAKE ONE TABLET BY MOUTH ONCE DAILY AS NEEDED FOR  FLUID 90 tablet 1  . lisinopril (PRINIVIL,ZESTRIL) 40 MG tablet TAKE 1 TABLET BY MOUTH ONCE DAILY 30 tablet 2  . metoprolol succinate (TOPROL-XL) 100 MG 24 hr tablet Take 1 tablet by mouth daily in the AM (take with or immediately following a meal) (Patient taking differently: 150 mg. Take 1 tablet by mouth daily in the AM (take with or immediately following a meal)) 90 tablet 1  . ondansetron (ZOFRAN) 4 MG tablet Take 1 tablet (4 mg total) by mouth every 8 (eight) hours as needed for nausea or vomiting. 20 tablet 0  . SF 5000 PLUS 1.1 % CREA dental cream As directed.    . SUMAtriptan (IMITREX) 50 MG tablet TAKE ONE TABLET BY MOUTH EVERY 2 HOURS AS NEEDED FOR  MIGRAINE  MAY  REPEAT  IN  2  HOURS  IF  HEADACHE  PERSISTS  OR  RECURS 9 tablet 5  . TRAVATAN Z 0.004 % SOLN ophthalmic solution Place 1 drop into both eyes at bedtime.     . Vitamin D, Ergocalciferol, (DRISDOL) 50000 units CAPS capsule Take 1 capsule (50,000 Units total) by mouth every 7 (seven) days. 4 capsule 0  .  atorvastatin (LIPITOR) 40 MG tablet Take 1 tablet (40 mg total) by mouth daily. 90 tablet 3  . diltiazem (CARDIZEM CD) 120 MG 24 hr capsule Take 1 capsule (120 mg total) by mouth daily. 90 capsule 3  . hydrochlorothiazide (HYDRODIURIL) 25 MG tablet Take 1 tablet (25 mg total) by mouth daily. 30 tablet 3   No current facility-administered medications on file prior to visit.     PAST MEDICAL HISTORY: Past Medical History:  Diagnosis Date  . Anemia   . Arthritis   . Blood transfusion without reported diagnosis   . Colon polyps   . Controlled type 2 diabetes mellitus without complication, without long-term current use of insulin ([place]) 05/31/2017  . Ectopic pregnancy    RIGHT salpingostomy  . Elective abortion   . GERD  (gastroesophageal reflux disease)   . Glaucoma    both eyes  . Headache    migraines  . History of chicken pox   . Hyperlipemia   . Hypertension   . Rheumatoid arthritis(714.0) 11/07/2011  . SAB (spontaneous abortion)   . Shortness of breath   . Sjogren's disease ([place])   . Wheezing     PAST SURGICAL HISTORY: Past Surgical History:  Procedure Laterality Date  . ABDOMINAL SURGERY     bowel repair  . COLONOSCOPY    . DILATION AND CURETTAGE OF UTERUS     X 2  . MENISCUS REPAIR Right    MENISCUS TEAR REPAIR  . MYOMECTOMY N/A 07/27/2014   Procedure: ABDOMINAL MYOMECTOMY;  Surgeon: Waymon Amato, MD;  Location: [facility];  Service: Gynecology;  Laterality: N/A;  . PELVIC LAPAROSCOPY  1994   IN 1999 LAPAROSCOPY WITH INCIDENTAL ENTEROTOMY REQUIRING LAPAROTOMY  . UTERINE FIBROID SURGERY     July 2016    SOCIAL HISTORY: Social History   Tobacco Use  . Smoking status: Never Smoker  . Smokeless tobacco: Never Used  Substance Use Topics  . Alcohol use: No    Alcohol/week: 0.0 standard drinks  . Drug use: No    FAMILY HISTORY: Family History  Problem Relation Age of Onset  . Hypertension Mother        died from heart disease  . Heart disease Mother        deceased, unknown cause  . Stroke Mother   . Obesity Mother   . Diabetes Sister   . Hypertension Sister   . Esophageal cancer Sister   . Stomach cancer Sister   . Cancer Sister   . Diabetes Sister   . Rectal cancer Neg Hx   . Colon cancer Neg Hx     ROS: Review of Systems  Constitutional: Positive for malaise/fatigue and weight loss.  Gastrointestinal: Negative for diarrhea, nausea and vomiting.  Musculoskeletal:       Negative for muscle weakness.  Endo/Heme/Allergies:       Negative for carb cravings.    PHYSICAL EXAM: Blood pressure 133/82, pulse 70, temperature 98.4 F (36.9 C), temperature source Oral, height 5\' 1"  (1.549 m), weight 204 lb (92.5 kg), SpO2 97 %. Body mass index is 38.55 kg/m. Physical Exam    Constitutional: She is oriented to person, place, and time. She appears well-developed and well-nourished.  Cardiovascular: Normal rate.  Pulmonary/Chest: Effort normal.  Musculoskeletal: Normal range of motion.  Neurological: She is oriented to person, place, and time.  Skin: Skin is warm and dry.  Psychiatric: She has a normal mood and affect. Her behavior is normal.  Vitals reviewed.   RECENT LABS  AND TESTS: BMET    Component Value Date/Time   NA 140 09/04/2017 1124   NA 138 01/21/2015 0935   K 4.1 09/04/2017 1124   K 3.9 01/21/2015 0935   CL 100 09/04/2017 1124   CL 108 (H) 06/25/2012 1508   CO2 24 09/04/2017 1124   CO2 25 01/21/2015 0935   GLUCOSE 82 09/04/2017 1124   GLUCOSE 109 (H) 05/31/2017 0819   GLUCOSE 128 01/21/2015 0935   GLUCOSE 107 (H) 06/25/2012 1508   BUN 10 09/04/2017 1124   BUN 7.0 01/21/2015 0935   CREATININE 0.60 09/04/2017 1124   CREATININE 0.8 01/21/2015 0935   CALCIUM 9.8 09/04/2017 1124   CALCIUM 9.4 01/21/2015 0935   GFRNONAA 102 09/04/2017 1124   GFRNONAA >89 10/01/2012 1412   GFRAA 118 09/04/2017 1124   GFRAA >89 10/01/2012 1412   Lab Results  Component Value Date   HGBA1C 6.0 (H) 09/04/2017   HGBA1C 6.5 05/31/2017   HGBA1C 6.1 09/06/2016   HGBA1C 5.0 08/31/2014   HGBA1C 6.0 04/09/2014   Lab Results  Component Value Date   INSULIN 16.5 09/04/2017   CBC    Component Value Date/Time   WBC 2.7 (L) 09/03/2017 0813   WBC 3.3 (L) 03/05/2017 0835   RBC 4.50 09/03/2017 0813   HGB 12.9 09/03/2017 0813   HGB 12.4 09/13/2016 0935   HCT 39.3 09/03/2017 0813   HCT 39.6 09/13/2016 0935   PLT 208 09/03/2017 0813   PLT 194 09/13/2016 0935   MCV 87.4 09/03/2017 0813   MCV 87.8 09/13/2016 0935   MCH 28.7 09/03/2017 0813   MCHC 32.8 09/03/2017 0813   RDW 13.4 09/03/2017 0813   RDW 14.4 09/13/2016 0935   LYMPHSABS 1.5 09/03/2017 0813   LYMPHSABS 1.4 09/13/2016 0935   MONOABS 0.2 09/03/2017 0813   MONOABS 0.2 09/13/2016 0935   EOSABS  0.1 09/03/2017 0813   EOSABS 0.1 09/13/2016 0935   BASOSABS 0.0 09/03/2017 0813   BASOSABS 0.0 09/13/2016 0935   Iron/TIBC/Ferritin/ %Sat    Component Value Date/Time   IRON 68 09/03/2017 0812   IRON 79 09/13/2016 0935   TIBC 285 09/03/2017 0812   TIBC 326 09/13/2016 0935   FERRITIN 203 09/03/2017 0812   FERRITIN 40 09/13/2016 0935   IRONPCTSAT 24 09/03/2017 0812   IRONPCTSAT 24 09/13/2016 0935   IRONPCTSAT 14 (L) 06/25/2012 1508   Lipid Panel     Component Value Date/Time   CHOL 158 09/04/2017 1124   TRIG 92 09/04/2017 1124   HDL 48 09/04/2017 1124   CHOLHDL 3 09/06/2016 1029   VLDL 10.6 09/06/2016 1029   LDLCALC 92 09/04/2017 1124   Hepatic Function Panel     Component Value Date/Time   PROT 9.5 (H) 09/04/2017 1124   PROT 9.2 (H) 01/21/2015 0935   ALBUMIN 4.4 09/04/2017 1124   ALBUMIN 3.5 01/21/2015 0935   AST 32 09/04/2017 1124   AST 17 01/21/2015 0935   ALT 32 09/04/2017 1124   ALT 15 01/21/2015 0935   ALKPHOS 55 09/04/2017 1124   ALKPHOS 54 01/21/2015 0935   BILITOT 1.0 09/04/2017 1124   BILITOT 0.55 01/21/2015 0935   BILIDIR 0.2 05/14/2016 1227      Component Value Date/Time   TSH 1.680 09/04/2017 1124   TSH 1.13 09/06/2016 1029   TSH 0.77 05/14/2016 1227   Results for ESTEPHANIA, LICCIARDI (MRN [id number]) as of 10/07/2017 15:05  Ref. Range 09/04/2017 11:24  Vitamin D, 25-Hydroxy Latest Ref Range: 30.0 - 100.0 ng/mL  37.2    ASSESSMENT AND PLAN: Vitamin D deficiency  Pre-diabetes  At risk for diabetes mellitus  Class 2 severe obesity with serious comorbidity and body mass index (BMI) of 38.0 to 38.9 in adult, unspecified obesity type ([place])  Type 2 diabetes mellitus without complication, without long-term current use of insulin ([place]) - Plan: metFORMIN (GLUCOPHAGE) 500 MG tablet  PLAN:  Vitamin D Deficiency Jnyah was informed that low vitamin D levels contributes to fatigue and are associated with obesity, breast, and colon cancer. She agrees  to continue to take prescription Vit D @50 ,000 IU every week and will follow up for routine testing of vitamin D, at least 2-3 times per year. She was informed of the risk of over-replacement of vitamin D and agrees to not increase her dose unless she discusses this with [place] first. She will follow up as directed in 2 weeks.  Pre-Diabetes Sharea will continue to work on weight loss, exercise, and decreasing simple carbohydrates in her diet to help decrease the risk of diabetes.  She was informed that eating too many simple carbohydrates or too many calories at one sitting increases the likelihood of GI side effects. Genasis has agreed to continue to take metformin 500mg  PO qAM # 30 with no refills and a prescription was not written today. Jerzie agreed to follow up with [place] as directed to monitor her progress.  Diabetes risk counseling Christal was given extended (15 minutes) diabetes prevention counseling today. She is 57 y.o. female and has risk factors for diabetes including pre-diabetes and obesity. We discussed intensive lifestyle modifications today with an emphasis on weight loss as well as increasing exercise and decreasing simple carbohydrates in her diet.  Obesity Cyntia is currently in the action stage of change. As such, her goal is to continue with weight loss efforts. She has agreed to follow the Category 2 plan. Rahaf has been instructed to work up to a goal of 150 minutes of combined cardio and strengthening exercise per week for weight loss and overall health benefits. We discussed the following Behavioral Modification Strategies today: increasing lean protein intake, increasing vegetables, work on meal planning and easy cooking plans, and planning for success.  Margert has agreed to follow up with our clinic in 2 weeks. She was informed of the importance of frequent follow up visits to maximize her success with intensive lifestyle modifications for her multiple  health conditions.   OBESITY BEHAVIORAL INTERVENTION VISIT  Today's visit was # 3   Starting weight: 211 lbs Starting date: 09/04/17 Today's weight : Weight: 204 lb (92.5 kg)  Today's date: 10/07/2017 Total lbs lost to date: 7  ASK: We discussed the diagnosis of obesity with Brigitte Pulse Woody today and Keonda agreed to give [place] permission to discuss obesity behavioral modification therapy today.  ASSESS: Aleeah has the diagnosis of obesity and her BMI today is 38.57. Theresia is in the action stage of change.   ADVISE: Ziggy was educated on the multiple health risks of obesity as well as the benefit of weight loss to improve her health. She was advised of the need for long term treatment and the importance of lifestyle modifications to improve her current health and to decrease her risk of future health problems.  AGREE: Multiple dietary modification options and treatment options were discussed and Lashya agreed to follow the recommendations documented in the above note.  ARRANGE: Lilya was educated on the importance of frequent visits to treat obesity as outlined per CMS and USPSTF guidelines  and agreed to schedule her next follow up appointment today.  I, Marcille Blanco, am acting as transcriptionist for Starlyn Skeans, MD  I have reviewed the above documentation for accuracy and completeness, and I agree with the above. - Ilene Qua, MD

## 2017-10-09 NOTE — Telephone Encounter (Signed)
Let's bring her in for an appointment with me please.  It looks like she will need the following:  Hep A, Hep B, Malaria prophylaxia, MMR titer, Typhoid prophylaxis. Bring hep B vaccine records if she has any. She will also need yellow fever vaccine which we do not have.  She should call the health department for this.

## 2017-10-09 NOTE — Telephone Encounter (Signed)
Patient scheduled for Friday, advised to bring any records of previous immunizations. Advised to call the health department for yellow fever vaccine.

## 2017-10-11 ENCOUNTER — Ambulatory Visit: Payer: PRIVATE HEALTH INSURANCE | Admitting: Family

## 2017-10-22 ENCOUNTER — Ambulatory Visit (INDEPENDENT_AMBULATORY_CARE_PROVIDER_SITE_OTHER): Payer: PRIVATE HEALTH INSURANCE | Admitting: Family Medicine

## 2017-10-22 ENCOUNTER — Encounter (INDEPENDENT_AMBULATORY_CARE_PROVIDER_SITE_OTHER): Payer: Self-pay

## 2017-10-24 ENCOUNTER — Encounter (INDEPENDENT_AMBULATORY_CARE_PROVIDER_SITE_OTHER): Payer: Self-pay

## 2017-10-28 ENCOUNTER — Ambulatory Visit (INDEPENDENT_AMBULATORY_CARE_PROVIDER_SITE_OTHER): Payer: PRIVATE HEALTH INSURANCE | Admitting: Family Medicine

## 2017-10-28 ENCOUNTER — Encounter (INDEPENDENT_AMBULATORY_CARE_PROVIDER_SITE_OTHER): Payer: Self-pay

## 2017-10-30 ENCOUNTER — Other Ambulatory Visit: Payer: Self-pay | Admitting: Family

## 2017-11-01 ENCOUNTER — Encounter: Payer: Self-pay | Admitting: Family

## 2017-11-01 ENCOUNTER — Ambulatory Visit: Payer: PRIVATE HEALTH INSURANCE | Admitting: Family

## 2017-11-01 ENCOUNTER — Ambulatory Visit (HOSPITAL_BASED_OUTPATIENT_CLINIC_OR_DEPARTMENT_OTHER)
Admission: RE | Admit: 2017-11-01 | Discharge: 2017-11-01 | Disposition: A | Discharge status: Home / Self Care | Payer: PRIVATE HEALTH INSURANCE | Source: Ambulatory Visit | Attending: Family | Admitting: Family

## 2017-11-01 VITALS — BP 131/79 | HR 73 | Temp 99.5°F | Resp 18 | Ht 61.0 in | Wt 211.2 lb

## 2017-11-01 DIAGNOSIS — R7611 Nonspecific reaction to tuberculin skin test without active tuberculosis: Secondary | ICD-10-CM | POA: Insufficient documentation

## 2017-11-01 DIAGNOSIS — Z0184 Encounter for antibody response examination: Secondary | ICD-10-CM

## 2017-11-01 DIAGNOSIS — Z23 Encounter for immunization: Secondary | ICD-10-CM | POA: Diagnosis not present

## 2017-11-01 MED ORDER — TYPHOID VACCINE PO CPDR: DELAYED_RELEASE_CAPSULE | ORAL | 0 refills | Status: DC

## 2017-11-01 MED ORDER — ATOVAQUONE-PROGUANIL HCL 250-100 MG PO TABS: ORAL_TABLET | ORAL | 0 refills | Status: DC

## 2017-11-01 NOTE — Progress Notes (Signed)
Subjective:    Patient ID: Amber [name], female    DOB: 12-08-1960, 57 y.o.   MRN: [id number]  HPI  Patient will be travelling to [place] and [place]. She will get Yellow fever vaccine in [place].  She reports that she completed the hepatitis B series.  Her trip will be 5-1/2 weeks long.  She will be traveling in December.  Review of Systems    See HPI  Past Medical History:  Diagnosis Date  . Anemia   . Arthritis   . Blood transfusion without reported diagnosis   . Colon polyps   . Controlled type 2 diabetes mellitus without complication, without long-term current use of insulin ([place]) 05/31/2017  . Ectopic pregnancy    RIGHT salpingostomy  . Elective abortion   . GERD (gastroesophageal reflux disease)   . Glaucoma    both eyes  . Headache    migraines  . History of chicken pox   . Hyperlipemia   . Hypertension   . Rheumatoid arthritis(714.0) 11/07/2011  . SAB (spontaneous abortion)   . Shortness of breath   . Sjogren's disease ([place])   . Wheezing      Social History   Socioeconomic History  . Marital status: Single    Spouse name: Not on file  . Number of children: 0  . Years of education: Not on file  . Highest education level: Not on file  Occupational History  . Occupation: LPN    Employer: [employer]  Social Needs  . Financial resource strain: Not on file  . Food insecurity:    Worry: Not on file    Inability: Not on file  . Transportation needs:    Medical: Not on file    Non-medical: Not on file  Tobacco Use  . Smoking status: Never Smoker  . Smokeless tobacco: Never Used  Substance and Sexual Activity  . Alcohol use: No    Alcohol/week: 0.0 standard drinks  . Drug use: No  . Sexual activity: Yes    Birth control/protection: None    Comment: 1st intercourse- 27, partners- 5  Lifestyle  . Physical activity:    Days per week: Not on file    Minutes per session: Not on file  . Stress: Not on file  Relationships  . Social  connections:    Talks on phone: Not on file    Gets together: Not on file    Attends religious service: Not on file    Active member of club or organization: Not on file    Attends meetings of clubs or organizations: Not on file    Relationship status: Not on file  . Intimate partner violence:    Fear of current or ex partner: Not on file    Emotionally abused: Not on file    Physically abused: Not on file    Forced sexual activity: Not on file  Other Topics Concern  . 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  Had a sister up in [place] who passed away 03-25-2015 who she was close to.   Born in [place]- she came to [place] as adult.  Age 33.           Past Surgical History:  Procedure Laterality  Date  . ABDOMINAL SURGERY     bowel repair  . COLONOSCOPY    . DILATION AND CURETTAGE OF UTERUS     X 2  . MENISCUS REPAIR Right    MENISCUS TEAR REPAIR  . MYOMECTOMY N/A 07/27/2014   Procedure: ABDOMINAL MYOMECTOMY;  Surgeon: Waymon Amato, MD;  Location: [facility];  Service: Gynecology;  Laterality: N/A;  . PELVIC LAPAROSCOPY  1994   IN 1999 LAPAROSCOPY WITH INCIDENTAL ENTEROTOMY REQUIRING LAPAROTOMY  . UTERINE FIBROID SURGERY     July 2016    Family History  Problem Relation Age of Onset  . Hypertension Mother        died from heart disease  . Heart disease Mother        deceased, unknown cause  . Stroke Mother   . Obesity Mother   . Diabetes Sister   . Hypertension Sister   . Esophageal cancer Sister   . Stomach cancer Sister   . Cancer Sister   . Diabetes Sister   . Rectal cancer Neg Hx   . Colon cancer Neg Hx     No Known Allergies  Current Outpatient Medications on File Prior to Visit  Medication Sig Dispense Refill  . albuterol (PROVENTIL HFA;VENTOLIN HFA) 108 (90 Base) MCG/ACT inhaler  Inhale 2 puffs into the lungs every 6 (six) hours as needed for wheezing or shortness of breath. 1 Inhaler 2  . aspirin EC 81 MG tablet Take 1 tablet (81 mg total) by mouth daily.    Marland Kitchen atorvastatin (LIPITOR) 40 MG tablet Take 1 tablet (40 mg total) by mouth daily. 90 tablet 3  . Cyanocobalamin (VITAMIN B-12) 5000 MCG LOZG Take 1 tablet by mouth 2 (two) times daily.    Marland Kitchen diltiazem (CARDIZEM CD) 120 MG 24 hr capsule Take 1 capsule (120 mg total) by mouth daily. 90 capsule 3  . ferrous sulfate 325 (65 FE) MG tablet Take 325 mg by mouth 3 (three) times daily with meals.     . fluticasone (FLONASE) 50 MCG/ACT nasal spray Place 2 sprays into both nostrils daily. 16 g 1  . Fluticasone-Salmeterol (ADVAIR DISKUS) 250-50 MCG/DOSE AEPB Inhale 1 puff into the lungs 2 (two) times daily. 1 each 3  . furosemide (LASIX) 20 MG tablet TAKE ONE TABLET BY MOUTH ONCE DAILY AS NEEDED FOR  FLUID 90 tablet 1  . hydrochlorothiazide (HYDRODIURIL) 25 MG tablet Take 1 tablet (25 mg total) by mouth daily. 30 tablet 3  . lisinopril (PRINIVIL,ZESTRIL) 40 MG tablet TAKE 1 TABLET BY MOUTH ONCE DAILY 30 tablet 2  . metFORMIN (GLUCOPHAGE) 500 MG tablet Take 1 tablet (500 mg total) by mouth daily with breakfast. 30 tablet 0  . metoprolol succinate (TOPROL-XL) 100 MG 24 hr tablet Take 1 tablet by mouth daily in the AM (take with or immediately following a meal) (Patient taking differently: 150 mg. Take 1 tablet by mouth daily in the AM (take with or immediately following a meal)) 90 tablet 1  . ondansetron (ZOFRAN) 4 MG tablet Take 1 tablet (4 mg total) by mouth every 8 (eight) hours as needed for nausea or vomiting. 20 tablet 0  . SF 5000 PLUS 1.1 % CREA dental cream As directed.    . SUMAtriptan (IMITREX) 50 MG tablet TAKE ONE TABLET BY MOUTH EVERY 2 HOURS AS NEEDED FOR  MIGRAINE  MAY  REPEAT  IN  2  HOURS  IF  HEADACHE  PERSISTS  OR  RECURS 9 tablet 5  . TRAVATAN Z  0.004 % SOLN ophthalmic solution Place 1 drop into both eyes at  bedtime.     . Vitamin D, Ergocalciferol, (DRISDOL) 50000 units CAPS capsule Take 1 capsule (50,000 Units total) by mouth every 7 (seven) days. 4 capsule 0   No current facility-administered medications on file prior to visit.     BP 131/79 (BP Location: Right Arm, Cuff Size: Large)   Pulse 73   Temp 99.5 F (37.5 C) (Oral) Comment (Src): Pt just drank coffee  Resp 18   Ht 5' 1"  (1.549 m)   Wt 211 lb 3.2 oz (95.8 kg)   SpO2 99%   BMI 39.91 kg/m    Objective:   Physical Exam  Constitutional: She is oriented to person, place, and time. She appears well-developed and well-nourished.  Cardiovascular: Normal rate, regular rhythm and normal heart sounds.  No murmur heard. Pulmonary/Chest: Effort normal and breath sounds normal. No respiratory distress. She has no wheezes.  Musculoskeletal: She exhibits no edema.  Neurological: She is alert and oriented to person, place, and time.  Skin: Skin is warm and dry.  Psychiatric: She has a normal mood and affect. Her behavior is normal. Judgment and thought content normal.          Assessment & Plan:  Encounter for vaccination- will initiate hepatitis A vaccine series today.  We will check an MMR titer.  If negative will bring her back for a booster shot.  Oral typhoid vaccine prescription has been provided to the patient as well as Malarone.  She is instructed on proper use.  She will be given meningitis vaccine, IPV vaccine, and hep A today.  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  She also reports that she had a BCG vaccine as a child.  She is requesting chest x-ray for her employer.  We will also send for tb gold testing.  She states that she did not complete antibiotic treatment for TB previously.   A total of 15  minutes were spent face-to-face with the patient during this encounter and over half of that time was spent on counseling and coordination of care. The patient was counseled  on travel immunization.

## 2017-11-02 LAB — QUANTIFERON-TB GOLD PLUS
Mitogen-NIL: >10 IU/mL
NIL: 0.05 IU/mL
QuantiFERON-TB Gold Plus: NEGATIVE
TB1-NIL: 0.02 IU/mL
TB2-NIL: 0 IU/mL

## 2017-11-04 LAB — MEASLES/MUMPS/RUBELLA IMMUNITY
Mumps IgG: >300 AU/mL
RUBELLA: 22.1 {index}
Rubeola IgG: >300 AU/mL

## 2017-12-04 ENCOUNTER — Ambulatory Visit (INDEPENDENT_AMBULATORY_CARE_PROVIDER_SITE_OTHER): Payer: PRIVATE HEALTH INSURANCE

## 2017-12-04 ENCOUNTER — Ambulatory Visit: Payer: PRIVATE HEALTH INSURANCE

## 2017-12-04 DIAGNOSIS — Z23 Encounter for immunization: Secondary | ICD-10-CM | POA: Diagnosis not present

## 2018-03-07 ENCOUNTER — Telehealth: Payer: Self-pay

## 2018-03-07 ENCOUNTER — Inpatient Hospital Stay: Payer: PRIVATE HEALTH INSURANCE | Attending: Oncology

## 2018-03-07 ENCOUNTER — Inpatient Hospital Stay: Payer: PRIVATE HEALTH INSURANCE | Admitting: Oncology

## 2018-03-07 ENCOUNTER — Telehealth: Payer: Self-pay | Admitting: Oncology

## 2018-03-07 VITALS — BP 152/81 | HR 68 | Temp 98.7°F | Resp 17 | Ht 61.0 in | Wt 208.0 lb

## 2018-03-07 DIAGNOSIS — M069 Rheumatoid arthritis, unspecified: Secondary | ICD-10-CM

## 2018-03-07 DIAGNOSIS — D500 Iron deficiency anemia secondary to blood loss (chronic): Secondary | ICD-10-CM | POA: Diagnosis not present

## 2018-03-07 DIAGNOSIS — D72819 Decreased white blood cell count, unspecified: Secondary | ICD-10-CM

## 2018-03-07 DIAGNOSIS — D508 Other iron deficiency anemias: Secondary | ICD-10-CM

## 2018-03-07 DIAGNOSIS — D509 Iron deficiency anemia, unspecified: Secondary | ICD-10-CM | POA: Insufficient documentation

## 2018-03-07 LAB — CBC WITH DIFFERENTIAL (CANCER CENTER ONLY)
Abs Immature Granulocytes: 0.01 10*3/uL (ref 0.00–0.07)
Basophils Absolute: 0 10*3/uL (ref 0.0–0.1)
Basophils Relative: 0 %
EOS ABS: 0.1 10*3/uL (ref 0.0–0.5)
EOS PCT: 3 %
HEMATOCRIT: 41.7 % (ref 36.0–46.0)
HEMOGLOBIN: 12.9 g/dL (ref 12.0–15.0)
Immature Granulocytes: 0 %
LYMPHS PCT: 56 %
Lymphs Abs: 1.9 10*3/uL (ref 0.7–4.0)
MCH: 27.4 pg (ref 26.0–34.0)
MCHC: 30.9 g/dL (ref 30.0–36.0)
MCV: 88.7 fL (ref 80.0–100.0)
MONO ABS: 0.3 10*3/uL (ref 0.1–1.0)
MONOS PCT: 7 %
NEUTROS ABS: 1.2 10*3/uL — AB (ref 1.7–7.7)
NEUTROS PCT: 34 %
Platelet Count: 215 10*3/uL (ref 150–400)
RBC: 4.7 MIL/uL (ref 3.87–5.11)
RDW: 14 % (ref 11.5–15.5)
WBC: 3.4 10*3/uL — AB (ref 4.0–10.5)
nRBC: 0 % (ref 0.0–0.2)

## 2018-03-07 LAB — IRON AND TIBC
Iron: 64 ug/dL (ref 28–170)
Saturation Ratios: 21 % (ref 10.4–31.8)
TIBC: 298 ug/dL (ref 250–450)
UIBC: 234 ug/dL

## 2018-03-07 LAB — FERRITIN: FERRITIN: 105 ng/mL (ref 11–307)

## 2018-03-07 NOTE — Telephone Encounter (Signed)
-----   Message from Amber Portela, MD sent at 03/07/2018  2:44 PM EST ----- Please let her know her iron is normal. No changes for now.

## 2018-03-07 NOTE — Progress Notes (Signed)
Hematology and Oncology Follow Up Visit  Amber [name] [id number] December 01, 1960 58 y.o. 03/07/2018 8:05 AM   Principle Diagnosis: 58 year old woman with:  1.  Anemia diagnosed in 2007 related to iron deficiency due to chronic menstrual blood losses and uterine fibroids.  2. Leukocytopenia diagnosed in 2015.  Bone marrow biopsy at the time showed hypercellular marrow with out any dysplasia.  Prior Therapy:  She is status post IV iron infusion in the form of Feraheme given on multiple occasions and repeated as needed.  Last treatment was in March 2019.  Current therapy: Iron sulfate 325 mg by mouth twice a day.  Interim History: Amber [name] returns today for a repeat evaluation.  Since her last visit, she reports no major changes in her health.  She continues to tolerate iron supplements without any excessive fatigue or tiredness.  She does report some constipation but no dyspepsia or diarrhea.  Her performance status and quality of life remains unchanged.  She does report some minor fatigue but manageable at this time.  She denies any recent hospitalizations or illnesses.  Patient denied any alteration mental status, neuropathy, confusion or dizziness.  Denies any headaches or lethargy.  Denies any night sweats, weight loss or changes in appetite.  Denied orthopnea, dyspnea on exertion or chest discomfort.  Denies shortness of breath, difficulty breathing hemoptysis or cough.  Denies any abdominal distention, nausea, early satiety or dyspepsia.  Denies any hematuria, frequency, dysuria or nocturia.  Denies any skin irritation, dryness or rash.  Denies any ecchymosis or petechiae.  Denies any lymphadenopathy or clotting.  Denies any heat or cold intolerance.  Denies any anxiety or depression.  Remaining review of system is negative.       Medications: I have reviewed the patient's current medications.  Current Outpatient Medications  Medication Sig Dispense Refill  . albuterol (PROVENTIL  HFA;VENTOLIN HFA) 108 (90 Base) MCG/ACT inhaler Inhale 2 puffs into the lungs every 6 (six) hours as needed for wheezing or shortness of breath. 1 Inhaler 2  . aspirin EC 81 MG tablet Take 1 tablet (81 mg total) by mouth daily.    Marland Kitchen atorvastatin (LIPITOR) 40 MG tablet Take 1 tablet (40 mg total) by mouth daily. 90 tablet 3  . atovaquone-proguanil (MALARONE) 250-100 MG TABS tablet Take 1 tab by mouth once daily. Start 2 days before trip and continue 7 days after returning home 51 tablet 0  . Cyanocobalamin (VITAMIN B-12) 5000 MCG LOZG Take 1 tablet by mouth 2 (two) times daily.    Marland Kitchen diltiazem (CARDIZEM CD) 120 MG 24 hr capsule Take 1 capsule (120 mg total) by mouth daily. 90 capsule 3  . ferrous sulfate 325 (65 FE) MG tablet Take 325 mg by mouth 3 (three) times daily with meals.     . fluticasone (FLONASE) 50 MCG/ACT nasal spray Place 2 sprays into both nostrils daily. 16 g 1  . Fluticasone-Salmeterol (ADVAIR DISKUS) 250-50 MCG/DOSE AEPB Inhale 1 puff into the lungs 2 (two) times daily. 1 each 3  . furosemide (LASIX) 20 MG tablet TAKE ONE TABLET BY MOUTH ONCE DAILY AS NEEDED FOR  FLUID 90 tablet 1  . hydrochlorothiazide (HYDRODIURIL) 25 MG tablet Take 1 tablet (25 mg total) by mouth daily. 30 tablet 3  . lisinopril (PRINIVIL,ZESTRIL) 40 MG tablet TAKE 1 TABLET BY MOUTH ONCE DAILY 30 tablet 2  . metFORMIN (GLUCOPHAGE) 500 MG tablet Take 1 tablet (500 mg total) by mouth daily with breakfast. 30 tablet 0  . metoprolol succinate (TOPROL-XL)  100 MG 24 hr tablet Take 1 tablet by mouth daily in the AM (take with or immediately following a meal) (Patient taking differently: 150 mg. Take 1 tablet by mouth daily in the AM (take with or immediately following a meal)) 90 tablet 1  . ondansetron (ZOFRAN) 4 MG tablet Take 1 tablet (4 mg total) by mouth every 8 (eight) hours as needed for nausea or vomiting. 20 tablet 0  . SF 5000 PLUS 1.1 % CREA dental cream As directed.    . SUMAtriptan (IMITREX) 50 MG tablet TAKE  ONE TABLET BY MOUTH EVERY 2 HOURS AS NEEDED FOR  MIGRAINE  MAY  REPEAT  IN  2  HOURS  IF  HEADACHE  PERSISTS  OR  RECURS 9 tablet 5  . TRAVATAN Z 0.004 % SOLN ophthalmic solution Place 1 drop into both eyes at bedtime.     . typhoid (VIVOTIF) DR capsule One capsule by mouth on (day 1, 3, 5, and 7) for a total of 4 doses; complete at least 1 week prior to potential exposure 4 capsule 0  . Vitamin D, Ergocalciferol, (DRISDOL) 50000 units CAPS capsule Take 1 capsule (50,000 Units total) by mouth every 7 (seven) days. 4 capsule 0   No current facility-administered medications for this visit.      Allergies: No Known Allergies  Family history, social history and surgical history was reviewed and updated today.   Physical Exam:  Blood pressure (!) 152/81, pulse 68, temperature 98.7 F (37.1 C), temperature source Oral, resp. rate 17, height _0  (1.549 m), weight 208 lb (94.3 kg), SpO2 100 %.    ECOG: 0   General appearance: Alert, awake without any distress. Head: Atraumatic without abnormalities Oropharynx: Without any thrush or ulcers. Eyes: No scleral icterus. Lymph nodes: No lymphadenopathy noted in the cervical, supraclavicular, or axillary nodes Heart:regular rate and rhythm, without any murmurs or gallops.   Lung: Clear to auscultation without any rhonchi, wheezes or dullness to percussion. Abdomin: Soft, nontender without any shifting dullness or ascites. Musculoskeletal: No clubbing or cyanosis. Neurological: No motor or sensory deficits. Skin: No rashes or lesions. Psychiatric: Mood and affect appeared normal.      Lab Results: Lab Results  Component Value Date   WBC 2.7 (L) 09/03/2017   HGB 12.9 09/03/2017   HCT 39.3 09/03/2017   MCV 87.4 09/03/2017   PLT 208 09/03/2017     Chemistry      Component Value Date/Time   NA 140 09/04/2017 1124   NA 138 01/21/2015 0935   K 4.1 09/04/2017 1124   K 3.9 01/21/2015 0935   CL 100 09/04/2017 1124   CL 108 (H)  06/25/2012 1508   CO2 24 09/04/2017 1124   CO2 25 01/21/2015 0935   BUN 10 09/04/2017 1124   BUN 7.0 01/21/2015 0935   CREATININE 0.60 09/04/2017 1124   CREATININE 0.8 01/21/2015 0935      Component Value Date/Time   CALCIUM 9.8 09/04/2017 1124   CALCIUM 9.4 01/21/2015 0935   ALKPHOS 55 09/04/2017 1124   ALKPHOS 54 01/21/2015 0935   AST 32 09/04/2017 1124   AST 17 01/21/2015 0935   ALT 32 09/04/2017 1124   ALT 15 01/21/2015 0935   BILITOT 1.0 09/04/2017 1124   BILITOT 0.55 01/21/2015 0935       Impression and Plan:  58 year old woman with the:   1.  Anemia dating back to 2007 related to iron deficiency and chronic blood losses from uterine fibroids.  She remains on oral iron supplements with reasonable tolerance and excellent counts.  Iron studies in September 2019 showed iron total of 68 with ferritin of 203.  Her hemoglobin continues to be within normal range today at 12.9 but iron studies are pending.  I recommended continuing oral iron replacement for maintenance purposes.  We will use intravenous iron in the future as needed if she develops worsening iron deficiency.  She is agreeable to continue with this plan.  Risks and benefits of repeat IV iron infusion was discussed today which include arthralgias, myalgias and infusion related complications.  She is willing to proceed with that if needed.  2. Leukocytopenia: Related to benign etiology versus autoimmune process.  A total white cell count is close to normal range.  Bone marrow biopsy in 2015 showed no abnormalities.  3. Rheumatoid arthritis: No recent exacerbation at this time.  She remains on Plaquenil.  4. Age-appropriate cancer screening: I continue to reiterate the importance of keeping her age-appropriate cancer screening including mammography and colonoscopy.  She is up-to-date at this time.  5. Followup will be in 6 months for a repeat evaluation.  15  minutes was spent with the patient face-to-face today.   More than 50% of time was spent on reviewing her disease status, laboratory data, treatment options and answering questions regarding future plan of care.    Zola Button 3/6/20208:05 AM

## 2018-03-07 NOTE — Telephone Encounter (Signed)
Gave avs and calendar ° °

## 2018-03-07 NOTE — Telephone Encounter (Signed)
Contacted patient and left message with iron level result WNL and to call back if there are any questions or concerns.

## 2018-03-12 ENCOUNTER — Other Ambulatory Visit: Payer: Self-pay | Admitting: Physician Assistant

## 2018-03-12 ENCOUNTER — Other Ambulatory Visit: Payer: Self-pay | Admitting: Cardiology

## 2018-03-12 ENCOUNTER — Other Ambulatory Visit (INDEPENDENT_AMBULATORY_CARE_PROVIDER_SITE_OTHER): Payer: Self-pay | Admitting: Family Medicine

## 2018-03-12 ENCOUNTER — Other Ambulatory Visit: Payer: Self-pay | Admitting: Family

## 2018-03-12 DIAGNOSIS — M5410 Radiculopathy, site unspecified: Secondary | ICD-10-CM

## 2018-03-12 DIAGNOSIS — I10 Essential (primary) hypertension: Secondary | ICD-10-CM

## 2018-03-12 DIAGNOSIS — R002 Palpitations: Secondary | ICD-10-CM

## 2018-03-12 DIAGNOSIS — E559 Vitamin D deficiency, unspecified: Secondary | ICD-10-CM

## 2018-09-09 ENCOUNTER — Inpatient Hospital Stay: Payer: 59

## 2018-09-09 ENCOUNTER — Telehealth: Payer: Self-pay | Admitting: Oncology

## 2018-09-09 ENCOUNTER — Other Ambulatory Visit: Payer: Self-pay

## 2018-09-09 ENCOUNTER — Inpatient Hospital Stay: Payer: 59 | Attending: Oncology | Admitting: Oncology

## 2018-09-09 NOTE — Telephone Encounter (Signed)
Called pt per 9/8 sch message - unable to reach pt - left message for pt to call back to reschedule.

## 2018-09-10 ENCOUNTER — Ambulatory Visit (INDEPENDENT_AMBULATORY_CARE_PROVIDER_SITE_OTHER): Payer: PRIVATE HEALTH INSURANCE | Admitting: Family

## 2018-09-10 ENCOUNTER — Telehealth: Payer: Self-pay | Admitting: Family

## 2018-09-10 ENCOUNTER — Encounter: Payer: Self-pay | Admitting: Family

## 2018-09-10 VITALS — BP 159/90 | HR 97 | Temp 97.4°F | Resp 18 | Wt 212.2 lb

## 2018-09-10 DIAGNOSIS — E119 Type 2 diabetes mellitus without complications: Secondary | ICD-10-CM

## 2018-09-10 DIAGNOSIS — I10 Essential (primary) hypertension: Secondary | ICD-10-CM | POA: Diagnosis not present

## 2018-09-10 DIAGNOSIS — Z0000 Encounter for general adult medical examination without abnormal findings: Secondary | ICD-10-CM

## 2018-09-10 DIAGNOSIS — J309 Allergic rhinitis, unspecified: Secondary | ICD-10-CM

## 2018-09-10 DIAGNOSIS — M069 Rheumatoid arthritis, unspecified: Secondary | ICD-10-CM

## 2018-09-10 DIAGNOSIS — Z23 Encounter for immunization: Secondary | ICD-10-CM | POA: Diagnosis not present

## 2018-09-10 LAB — CBC WITH DIFFERENTIAL/PLATELET
Basophils Absolute: 0 10*3/uL (ref 0.0–0.1)
Basophils Relative: 0.3 % (ref 0.0–3.0)
Eosinophils Absolute: 0.2 10*3/uL (ref 0.0–0.7)
Eosinophils Relative: 4.9 % (ref 0.0–5.0)
HCT: 39.1 % (ref 36.0–46.0)
Hemoglobin: 12.8 g/dL (ref 12.0–15.0)
Lymphocytes Relative: 64.6 % — ABNORMAL HIGH (ref 12.0–46.0)
Lymphs Abs: 2 10*3/uL (ref 0.7–4.0)
MCHC: 32.8 g/dL (ref 30.0–36.0)
MCV: 85.7 fl (ref 78.0–100.0)
Monocytes Absolute: 0.2 10*3/uL (ref 0.1–1.0)
Monocytes Relative: 7.1 % (ref 3.0–12.0)
Neutro Abs: 0.7 10*3/uL — ABNORMAL LOW (ref 1.4–7.7)
Neutrophils Relative %: 23.1 % — ABNORMAL LOW (ref 43.0–77.0)
Platelets: 202 10*3/uL (ref 150.0–400.0)
RBC: 4.56 Mil/uL (ref 3.87–5.11)
RDW: 13.9 % (ref 11.5–15.5)
WBC: 3.2 10*3/uL — ABNORMAL LOW (ref 4.0–10.5)

## 2018-09-10 LAB — LIPID PANEL
Cholesterol: 147 mg/dL (ref 0–200)
HDL: 44.5 mg/dL (ref >39.00)
LDL Cholesterol: 89 mg/dL (ref 0–99)
NonHDL: 102.96
Total CHOL/HDL Ratio: 3
Triglycerides: 68 mg/dL (ref 0.0–149.0)
VLDL: 13.6 mg/dL (ref 0.0–40.0)

## 2018-09-10 LAB — HEMOGLOBIN A1C: Hgb A1c MFr Bld: 6.3 % (ref 4.6–6.5)

## 2018-09-10 LAB — BASIC METABOLIC PANEL
BUN: 13 mg/dL (ref 6–23)
CO2: 29 mEq/L (ref 19–32)
Calcium: 9.9 mg/dL (ref 8.4–10.5)
Chloride: 104 mEq/L (ref 96–112)
Creatinine, Ser: 0.65 mg/dL (ref 0.40–1.20)
GFR: 113.6 mL/min (ref >60.00)
Glucose, Bld: 81 mg/dL (ref 70–99)
Potassium: 4.2 mEq/L (ref 3.5–5.1)
Sodium: 137 mEq/L (ref 135–145)

## 2018-09-10 LAB — HEPATIC FUNCTION PANEL
ALT: 20 U/L (ref 0–35)
AST: 17 U/L (ref 0–37)
Albumin: 4 g/dL (ref 3.5–5.2)
Alkaline Phosphatase: 62 U/L (ref 39–117)
Bilirubin, Direct: 0.2 mg/dL (ref 0.0–0.3)
Total Bilirubin: 0.6 mg/dL (ref 0.2–1.2)
Total Protein: 9.1 g/dL — ABNORMAL HIGH (ref 6.0–8.3)

## 2018-09-10 LAB — TSH: TSH: 1.47 u[IU]/mL (ref 0.35–4.50)

## 2018-09-10 MED ORDER — SUMATRIPTAN SUCCINATE 50 MG PO TABS: ORAL_TABLET | ORAL | 5 refills | Status: DC

## 2018-09-10 MED ORDER — ALBUTEROL SULFATE HFA 108 (90 BASE) MCG/ACT IN AERS: 2.0000 | INHALATION_SPRAY | Freq: Four times a day (QID) | RESPIRATORY_TRACT | 5 refills | Status: DC | PRN

## 2018-09-10 MED ORDER — FLUTICASONE-SALMETEROL 250-50 MCG/DOSE IN AEPB: 1.0000 | INHALATION_SPRAY | Freq: Two times a day (BID) | RESPIRATORY_TRACT | 5 refills | Status: DC

## 2018-09-10 NOTE — Telephone Encounter (Signed)
Can you please request copy of eye exam from Dr. Gershon Crane?

## 2018-09-10 NOTE — Patient Instructions (Addendum)
Please check your blood pressure several times and send me your updated readings via mychart. Schedule mammogram on the first floor. You be contacted about scheduling your colonoscopy.  Please complete lab work prior to leaving.

## 2018-09-10 NOTE — Progress Notes (Signed)
Subjective:    Patient ID: Amber [name], female    DOB: Feb 14, 1960, 58 y.o.   MRN: [id number]  HPI  Patient is a 58 yr old female who presents today for follow up.  Patient presents today for complete physical.  Immunizations: due for flu shot, pneumovax 23 Diet:  Needs improvement Wt Readings from Last 3 Encounters:  09/10/18 212 lb 3.2 oz (96.3 kg)  03/07/18 208 lb (94.3 kg)  11/01/17 211 lb 3.2 oz (95.8 kg)  Exercise:  Not exercising Colonoscopy: 2013 Dexa:  2019 Pap Smear: 09/30/17 Mammogram: 10/04/17  Dental: up to date Vision: 3/20 (Dr. Gershon Crane)  Migraines- reports that she has migraines once a week.  She is working 6 days a week.  Allergic rhinitis- stabel on flonase.  HTN- did not take her meds this AM.  BP Readings from Last 3 Encounters:  09/10/18 (!) 159/90  03/07/18 (!) 152/81  11/01/17 131/79   Rheumatoid arthritis- notes + pain in hands, knees, ankles. Requesting referral to rheumatology. Previous rheumatologist left practice.   Lab Results  Component Value Date   HGBA1C 6.0 (H) 09/04/2017   HGBA1C 6.5 05/31/2017   HGBA1C 6.1 09/06/2016   Lab Results  Component Value Date   MICROALBUR 3.20 (H) 11/07/2011   LDLCALC 92 09/04/2017   CREATININE 0.60 09/04/2017     Review of Systems  Constitutional: Negative for unexpected weight change.  HENT: Negative for hearing loss and rhinorrhea.   Eyes: Negative for visual disturbance.  Respiratory: Negative for cough and shortness of breath.   Cardiovascular: Positive for leg swelling (some ankle and hand swelling sometimes). Negative for chest pain.  Gastrointestinal: Negative for blood in stool, constipation and diarrhea.  Genitourinary: Negative for dysuria, frequency and hematuria.  Musculoskeletal: Positive for arthralgias. Negative for myalgias.  Skin: Negative for rash.  Neurological: Positive for headaches.  Hematological: Negative for adenopathy.  Psychiatric/Behavioral:       Denies  depression/anxiety   Past Medical History:  Diagnosis Date  . Anemia   . Arthritis   . Blood transfusion without reported diagnosis   . Colon polyps   . Controlled type 2 diabetes mellitus without complication, without long-term current use of insulin ([place]) 05/31/2017  . Ectopic pregnancy    RIGHT salpingostomy  . Elective abortion   . GERD (gastroesophageal reflux disease)   . Glaucoma    both eyes  . Headache    migraines  . History of chicken pox   . Hyperlipemia   . Hypertension   . Rheumatoid arthritis(714.0) 11/07/2011  . SAB (spontaneous abortion)   . Shortness of breath   . Sjogren's disease ([place])   . Wheezing      Social History   Socioeconomic History  . Marital status: Single    Spouse name: Not on file  . Number of children: 0  . Years of education: Not on file  . Highest education level: Not on file  Occupational History  . Occupation: LPN    Employer: [employer]  Social Needs  . Financial resource strain: Not on file  . Food insecurity    Worry: Not on file    Inability: Not on file  . Transportation needs    Medical: Not on file    Non-medical: Not on file  Tobacco Use  . Smoking status: Never Smoker  . Smokeless tobacco: Never Used  Substance and Sexual Activity  . Alcohol use: No    Alcohol/week: 0.0 standard drinks  . Drug use: No  .  Sexual activity: Yes    Birth control/protection: None    Comment: 1st intercourse- 3, partners- 5  Lifestyle  . Physical activity    Days per week: Not on file    Minutes per session: Not on file  . Stress: Not on file  Relationships  . Social Herbalist on phone: Not on file    Gets together: Not on file    Attends religious service: Not on file    Active member of club or organization: Not on file    Attends meetings of clubs or organizations: Not on file    Relationship status: Not on file  . Intimate partner violence    Fear of current or ex partner: Not on file    Emotionally abused:  Not on file    Physically abused: Not on file    Forced sexual activity: Not on file  Other Topics Concern  . 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  Had a sister up in [place] who passed away 04/07/2015 who she was close to.   Born [place]- she came to [place] as adult.  Age 89.           Past Surgical History:  Procedure Laterality Date  . ABDOMINAL SURGERY     bowel repair  . COLONOSCOPY    . DILATION AND CURETTAGE OF UTERUS     X 2  . MENISCUS REPAIR Right    MENISCUS TEAR REPAIR  . MYOMECTOMY N/A 07/27/2014   Procedure: ABDOMINAL MYOMECTOMY;  Surgeon: Waymon Amato, MD;  Location: [facility];  Service: Gynecology;  Laterality: N/A;  . PELVIC LAPAROSCOPY  1994   IN 1999 LAPAROSCOPY WITH INCIDENTAL ENTEROTOMY REQUIRING LAPAROTOMY  . UTERINE FIBROID SURGERY     July 2016    Family History  Problem Relation Age of Onset  . Hypertension Mother        died from heart disease  . Heart disease Mother        deceased, unknown cause  . Stroke Mother   . Obesity Mother   . Diabetes Sister   . Hypertension Sister   . Esophageal cancer Sister   . Stomach cancer Sister   . Cancer Sister   . Diabetes Sister   . Rectal cancer Neg Hx   . Colon cancer Neg Hx     No Known Allergies  Current Outpatient Medications on File Prior to Visit  Medication Sig Dispense Refill  . aspirin EC 81 MG tablet Take 1 tablet (81 mg total) by mouth daily.    Marland Kitchen atorvastatin (LIPITOR) 40 MG tablet Take 1 tablet (40 mg total) by mouth daily. Please call and a schedule an appt for further refills. 1st attempt 30 tablet 0  . Cyanocobalamin (VITAMIN B-12) 5000 MCG LOZG Take 1 tablet by mouth 2 (two) times daily.    Marland Kitchen diltiazem (CARTIA XT) 120 MG 24 hr capsule Take 1 capsule (120 mg total) by mouth daily. Please call and schedule  an appt for further refills. 1st attempt 30 capsule 0  . ferrous sulfate 325 (65 FE) MG tablet Take 325 mg by mouth 3 (three) times daily with meals.     . fluticasone (FLONASE) 50 MCG/ACT nasal spray Place 2  sprays into both nostrils daily. 16 g 1  . furosemide (LASIX) 20 MG tablet TAKE 1 TABLET BY MOUTH ONCE DAILY AS NEEDED FOR  FLUID 90 tablet 0  . hydrochlorothiazide (HYDRODIURIL) 25 MG tablet Take 1 tablet (25 mg total) by mouth daily. Please call and schedule an appt for further refills.1st attempt 30 tablet 0  . lisinopril (PRINIVIL,ZESTRIL) 40 MG tablet Take 1 tablet by mouth once daily 30 tablet 0  . metFORMIN (GLUCOPHAGE) 500 MG tablet Take 1 tablet (500 mg total) by mouth daily with breakfast. 30 tablet 0  . metoprolol succinate (TOPROL-XL) 100 MG 24 hr tablet TAKE 1 TABLET BY MOUTH ONCE DAILY(TAKE WITH OR IMMEDIATELY FOLLOWING A MEAL) 30 tablet 0  . ondansetron (ZOFRAN) 4 MG tablet TAKE 1 TABLET BY MOUTH EVERY 8 HOURS AS NEEDED FOR NAUSEA AND VOMITING 20 tablet 0  . SF 5000 PLUS 1.1 % CREA dental cream As directed.    . TRAVATAN Z 0.004 % SOLN ophthalmic solution Place 1 drop into both eyes at bedtime.     . Vitamin D, Ergocalciferol, (DRISDOL) 50000 units CAPS capsule Take 1 capsule (50,000 Units total) by mouth every 7 (seven) days. 4 capsule 0   No current facility-administered medications on file prior to visit.     BP (!) 159/90 (BP Location: Left Arm, Patient Position: Sitting, Cuff Size: Large)   Pulse 97   Temp (!) 97.4 F (36.3 C) (Temporal)   Resp 18   Wt 212 lb 3.2 oz (96.3 kg)   SpO2 100%   BMI 40.09 kg/m        Objective:   Physical Exam  Physical Exam  Constitutional: She is oriented to person, place, and time. She appears well-developed and well-nourished. No distress.  HENT:  Head: Normocephalic and atraumatic.  Right Ear: Tympanic membrane and ear canal normal.  Left Ear: Tympanic membrane and ear canal normal.  Mouth/Throat:  Not examined pt wearing  a mask  Eyes: Pupils are equal, round, and reactive to light. No scleral icterus.  Neck: Normal range of motion. No thyromegaly present.  Cardiovascular: Normal rate and regular rhythm.   No murmur heard. Pulmonary/Chest: Effort normal and breath sounds normal. No respiratory distress. He has no wheezes. She has no rales. She exhibits no tenderness.  Abdominal: Soft. Bowel sounds are normal. She exhibits no distension and no mass. There is no tenderness. There is no rebound and no guarding.  Musculoskeletal: She exhibits no edema.  Lymphadenopathy:    She has no cervical adenopathy.  Neurological: She is alert and oriented to person, place, and time. She has normal patellar reflexes. She exhibits normal muscle tone. Coordination normal.  Skin: Skin is warm and dry.  Psychiatric: She has a normal mood and affect. Her behavior is normal. Judgment and thought content normal.  Breasts: Examined lying Right: Without masses, retractions, discharge or axillary adenopathy.  Left: Without masses, retractions, discharge or axillary adenopathy.           Assessment & Plan:   Preventative care- refer for colo and mammogram. Flu shot and pneumovax today. Obtain routine lab work.  Allergic rhinitis- stable on flonase. Continue same.   HTN- bp up- did not take AM meds. She is instructed to please check blood pressure several times and send me updated readings via mychart. Continue current medications.  RA/Sjogrens- refer to new rheumatologist at pt request since her old rheumatologist left the practice.   DM2- will obtain follow up A1C.  Will request eye  exam from ophthalmology.     Assessment & Plan:

## 2018-09-11 NOTE — Telephone Encounter (Signed)
Medical Record Request for Continuity of Care faxed to [facility].

## 2018-09-18 ENCOUNTER — Telehealth: Payer: Self-pay | Admitting: Family

## 2018-09-18 NOTE — Telephone Encounter (Signed)
Patient has been referred to [name] for rheumatology.  Please request rheumatology records from her previous rheumatologist [name] and forward those results with referral.

## 2018-09-19 ENCOUNTER — Telehealth: Payer: Self-pay | Admitting: Oncology

## 2018-09-19 NOTE — Telephone Encounter (Signed)
Returned patient's phone call regarding rescheduling missed 09/08 appointment, per patient's request appointment has been rescheduled for 10/02.

## 2018-09-22 ENCOUNTER — Other Ambulatory Visit: Payer: Self-pay

## 2018-09-22 DIAGNOSIS — M5410 Radiculopathy, site unspecified: Secondary | ICD-10-CM

## 2018-09-22 DIAGNOSIS — R002 Palpitations: Secondary | ICD-10-CM

## 2018-09-22 DIAGNOSIS — I10 Essential (primary) hypertension: Secondary | ICD-10-CM

## 2018-09-22 DIAGNOSIS — E119 Type 2 diabetes mellitus without complications: Secondary | ICD-10-CM

## 2018-09-22 MED ORDER — ATORVASTATIN CALCIUM 40 MG PO TABS: 40.0000 mg | ORAL_TABLET | Freq: Every day | ORAL | 2 refills | Status: DC

## 2018-09-22 MED ORDER — DILTIAZEM HCL ER COATED BEADS 120 MG PO CP24: 120.0000 mg | ORAL_CAPSULE | Freq: Every day | ORAL | 2 refills | Status: DC

## 2018-09-22 MED ORDER — METOPROLOL SUCCINATE ER 100 MG PO TB24: ORAL_TABLET | ORAL | 2 refills | Status: DC

## 2018-09-22 MED ORDER — FUROSEMIDE 20 MG PO TABS: ORAL_TABLET | ORAL | 0 refills | Status: DC

## 2018-09-22 MED ORDER — METFORMIN HCL 500 MG PO TABS: 500.0000 mg | ORAL_TABLET | Freq: Every day | ORAL | 2 refills | Status: DC

## 2018-09-22 MED ORDER — HYDROCHLOROTHIAZIDE 25 MG PO TABS: 25.0000 mg | ORAL_TABLET | Freq: Every day | ORAL | 2 refills | Status: DC

## 2018-09-22 MED ORDER — LISINOPRIL 40 MG PO TABS: 40.0000 mg | ORAL_TABLET | Freq: Every day | ORAL | 2 refills | Status: DC

## 2018-09-22 NOTE — Telephone Encounter (Signed)
Medical records release form faxed to Dr Leigh Aurora

## 2018-09-23 NOTE — Telephone Encounter (Signed)
Faxed came back as unable to release records. Patient was advised to try to get records since they will not be released to [place]. She said she will go today.

## 2018-10-01 NOTE — Telephone Encounter (Signed)
Records received from [place], records pertaining her rheumatology evaluation and treatment faxed to [name]

## 2018-10-03 ENCOUNTER — Inpatient Hospital Stay (HOSPITAL_BASED_OUTPATIENT_CLINIC_OR_DEPARTMENT_OTHER): Payer: 59 | Admitting: Oncology

## 2018-10-03 ENCOUNTER — Inpatient Hospital Stay: Payer: 59 | Attending: Oncology

## 2018-10-03 ENCOUNTER — Other Ambulatory Visit: Payer: Self-pay

## 2018-10-03 VITALS — BP 146/82 | HR 77 | Temp 99.4°F | Resp 17 | Ht 61.0 in

## 2018-10-03 DIAGNOSIS — Z7951 Long term (current) use of inhaled steroids: Secondary | ICD-10-CM | POA: Insufficient documentation

## 2018-10-03 DIAGNOSIS — N920 Excessive and frequent menstruation with regular cycle: Secondary | ICD-10-CM | POA: Diagnosis present

## 2018-10-03 DIAGNOSIS — Z79899 Other long term (current) drug therapy: Secondary | ICD-10-CM | POA: Insufficient documentation

## 2018-10-03 DIAGNOSIS — D508 Other iron deficiency anemias: Secondary | ICD-10-CM | POA: Diagnosis not present

## 2018-10-03 DIAGNOSIS — D500 Iron deficiency anemia secondary to blood loss (chronic): Secondary | ICD-10-CM | POA: Diagnosis not present

## 2018-10-03 DIAGNOSIS — Z7982 Long term (current) use of aspirin: Secondary | ICD-10-CM | POA: Diagnosis not present

## 2018-10-03 DIAGNOSIS — D259 Leiomyoma of uterus, unspecified: Secondary | ICD-10-CM | POA: Insufficient documentation

## 2018-10-03 DIAGNOSIS — Z7984 Long term (current) use of oral hypoglycemic drugs: Secondary | ICD-10-CM | POA: Diagnosis not present

## 2018-10-03 DIAGNOSIS — M069 Rheumatoid arthritis, unspecified: Secondary | ICD-10-CM | POA: Diagnosis not present

## 2018-10-03 DIAGNOSIS — D72819 Decreased white blood cell count, unspecified: Secondary | ICD-10-CM | POA: Diagnosis not present

## 2018-10-03 LAB — CBC WITH DIFFERENTIAL (CANCER CENTER ONLY)
Abs Immature Granulocytes: 0.01 10*3/uL (ref 0.00–0.07)
Basophils Absolute: 0 10*3/uL (ref 0.0–0.1)
Basophils Relative: 0 %
Eosinophils Absolute: 0.1 10*3/uL (ref 0.0–0.5)
Eosinophils Relative: 3 %
HCT: 41.2 % (ref 36.0–46.0)
Hemoglobin: 13.2 g/dL (ref 12.0–15.0)
Immature Granulocytes: 0 %
Lymphocytes Relative: 62 %
Lymphs Abs: 2.3 10*3/uL (ref 0.7–4.0)
MCH: 28.4 pg (ref 26.0–34.0)
MCHC: 32 g/dL (ref 30.0–36.0)
MCV: 88.6 fL (ref 80.0–100.0)
Monocytes Absolute: 0.3 10*3/uL (ref 0.1–1.0)
Monocytes Relative: 8 %
Neutro Abs: 1 10*3/uL — ABNORMAL LOW (ref 1.7–7.7)
Neutrophils Relative %: 27 %
Platelet Count: 208 10*3/uL (ref 150–400)
RBC: 4.65 MIL/uL (ref 3.87–5.11)
RDW: 14 % (ref 11.5–15.5)
WBC Count: 3.8 10*3/uL — ABNORMAL LOW (ref 4.0–10.5)
nRBC: 0 % (ref 0.0–0.2)

## 2018-10-03 NOTE — Progress Notes (Signed)
Hematology and Oncology Follow Up Visit  Amber [name] [id number] [birth year] 58 y.o. 10/03/2018 3:20 PM   Principle Diagnosis: 58 year old woman with:  1.  Iron deficiency anemia due to chronic menstrual blood losses and uterine fibroids since 2007.  2. 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  Her work-up included a bone marrow biopsy that was unrevealing.    Prior Therapy:  She is status post IV iron infusion in the form of Feraheme given on multiple occasions and repeated as needed.  Last treatment was in March 2019.  Current therapy: Iron sulfate 325 mg by mouth but has not been taking it as of late.  Interim History: Amber [name] is here for a follow-up visit.  Since the last visit, she reports no major changes in her health.  She continues to report some mild fatigue and tiredness but despite that she still able to work full-time without any decline in ability to do so.  She denies any hematochezia, melena or recent hospitalizations.  Her performance status and quality of life remain excellent.  She is no longer taking oral iron at this time.  She denied headaches, blurry vision, syncope or seizures.  Denies any fevers, chills or sweats.  Denied chest pain, palpitation, orthopnea or leg edema.  Denied cough, wheezing or hemoptysis.  Denied nausea, vomiting or abdominal pain.  Denies any constipation or diarrhea.  Denies any frequency urgency or hesitancy.  Denies any arthralgias or myalgias.  Denies any skin rashes or lesions.  Denies any bleeding or clotting tendency.  Denies any easy bruising.  Denies any hair or nail changes.  Denies any anxiety or depression.  Remaining review of system is negative.       Medications: Unchanged on review today. Current Outpatient Medications  Medication Sig Dispense Refill  . albuterol (VENTOLIN HFA) 108 (90 Base) MCG/ACT inhaler Inhale 2 puffs into the lungs every 6 (six) hours as needed for wheezing or shortness of breath. 18  g 5  . aspirin EC 81 MG tablet Take 1 tablet (81 mg total) by mouth daily.    Marland Kitchen atorvastatin (LIPITOR) 40 MG tablet Take 1 tablet (40 mg total) by mouth daily. Please call and a schedule an appt for further refills. 1st attempt 30 tablet 2  . Cyanocobalamin (VITAMIN B-12) 5000 MCG LOZG Take 1 tablet by mouth 2 (two) times daily.    Marland Kitchen diltiazem (CARTIA XT) 120 MG 24 hr capsule Take 1 capsule (120 mg total) by mouth daily. Please call and schedule an appt for further refills. 1st attempt 30 capsule 2  . ferrous sulfate 325 (65 FE) MG tablet Take 325 mg by mouth 3 (three) times daily with meals.     . fluticasone (FLONASE) 50 MCG/ACT nasal spray Place 2 sprays into both nostrils daily. 16 g 1  . Fluticasone-Salmeterol (ADVAIR) 250-50 MCG/DOSE AEPB Inhale 1 puff into the lungs 2 (two) times daily. 60 each 5  . furosemide (LASIX) 20 MG tablet TAKE 1 TABLET BY MOUTH ONCE DAILY AS NEEDED FOR  FLUID 90 tablet 0  . hydrochlorothiazide (HYDRODIURIL) 25 MG tablet Take 1 tablet (25 mg total) by mouth daily. Please call and schedule an appt for further refills.1st attempt 30 tablet 2  . lisinopril (ZESTRIL) 40 MG tablet Take 1 tablet (40 mg total) by mouth daily. 30 tablet 2  . metFORMIN (GLUCOPHAGE) 500 MG tablet Take 1 tablet (500 mg total) by mouth daily with breakfast. 30 tablet 2  . metoprolol succinate (TOPROL-XL) 100  MG 24 hr tablet TAKE 1 TABLET BY MOUTH ONCE DAILY(TAKE WITH OR IMMEDIATELY FOLLOWING A MEAL) 30 tablet 2  . ondansetron (ZOFRAN) 4 MG tablet TAKE 1 TABLET BY MOUTH EVERY 8 HOURS AS NEEDED FOR NAUSEA AND VOMITING 20 tablet 0  . SF 5000 PLUS 1.1 % CREA dental cream As directed.    . SUMAtriptan (IMITREX) 50 MG tablet TAKE 1 TABLET BY MOUTH EVERY 2 HOURS AS NEEDED FOR  MIGRAINE  MAY  REPEAT  IN  2  HOURS  IF  HEADACHE  PERSISTS  OR  RECURS 9 tablet 5  . TRAVATAN Z 0.004 % SOLN ophthalmic solution Place 1 drop into both eyes at bedtime.     . Vitamin D, Ergocalciferol, (DRISDOL) 50000 units CAPS  capsule Take 1 capsule (50,000 Units total) by mouth every 7 (seven) days. 4 capsule 0   No current facility-administered medications for this visit.      Allergies: No Known Allergies  Family history, social history and surgical history updated without any changes.   Physical Exam:   Blood pressure (!) 146/82, pulse 77, temperature 99.4 F (37.4 C), temperature source Oral, resp. rate 17, height _0  (1.549 m), SpO2 100 %.     ECOG: 0   General appearance: Comfortable appearing without any discomfort Head: Normocephalic without any trauma Oropharynx: Mucous membranes are moist and pink without any thrush or ulcers. Eyes: Pupils are equal and round reactive to light. Lymph nodes: No cervical, supraclavicular, inguinal or axillary lymphadenopathy.   Heart:regular rate and rhythm.  S1 and S2 without leg edema. Lung: Clear without any rhonchi or wheezes.  No dullness to percussion. Abdomin: Soft, nontender, nondistended with good bowel sounds.  No hepatosplenomegaly. Musculoskeletal: No joint deformity or effusion.  Full range of motion noted. Neurological: No deficits noted on motor, sensory and deep tendon reflex exam. Skin: No petechial rash or dryness.  Appeared moist.       Lab Results: Lab Results  Component Value Date   WBC 3.2 (L) 09/10/2018   HGB 12.8 09/10/2018   HCT 39.1 09/10/2018   MCV 85.7 09/10/2018   PLT 202.0 09/10/2018     Chemistry      Component Value Date/Time   NA 137 09/10/2018 1040   NA 140 09/04/2017 1124   NA 138 01/21/2015 0935   K 4.2 09/10/2018 1040   K 3.9 01/21/2015 0935   CL 104 09/10/2018 1040   CL 108 (H) 06/25/2012 1508   CO2 29 09/10/2018 1040   CO2 25 01/21/2015 0935   BUN 13 09/10/2018 1040   BUN 10 09/04/2017 1124   BUN 7.0 01/21/2015 0935   CREATININE 0.65 09/10/2018 1040   CREATININE 0.8 01/21/2015 0935      Component Value Date/Time   CALCIUM 9.9 09/10/2018 1040   CALCIUM 9.4 01/21/2015 0935   ALKPHOS 62  09/10/2018 1040   ALKPHOS 54 01/21/2015 0935   AST 17 09/10/2018 1040   AST 17 01/21/2015 0935   ALT 20 09/10/2018 1040   ALT 15 01/21/2015 0935   BILITOT 0.6 09/10/2018 1040   BILITOT 1.0 09/04/2017 1124   BILITOT 0.55 01/21/2015 0935       Impression and Plan:  58 year old woman with the:   1.  Iron deficiency anemia related to chronic menstrual blood losses diagnosed in 2007.  Iron studies obtained in March 2020 showed normal levels at this time.  Her hemoglobin today is 13.2 without any decline at this time.  Risks and benefits of  continuing oral iron versus discontinuation.  Given the stabilization of her hemoglobin as well as iron studies I have recommended discontinuation of oral iron.  She is to resume taking it in the future.  2. 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  The etiology is likely related to benign causes and possible autoimmune in nature.  Her white cell count is close to normal range at this time.  3. Rheumatoid arthritis: She has been on Plaquenil in the past although she is reestablishing care with rheumatology with potential restarting it in the future.  4. Age-appropriate cancer screening: I continue to advise her to follow-up on her mammography and colonoscopy.  5. Followup: I am happy to see her in the future as needed.  15  minutes was spent with the patient face-to-face today.  More than 50% of time was dedicated to updating her disease status, reviewing laboratory data as well as answering questions regarding future plan of care.    Amber [name] 10/2/20203:20 PM

## 2018-10-06 ENCOUNTER — Telehealth: Payer: Self-pay

## 2018-10-06 LAB — IRON AND TIBC
Iron: 79 ug/dL (ref 41–142)
Saturation Ratios: 27 % (ref 21–57)
TIBC: 289 ug/dL (ref 236–444)
UIBC: 210 ug/dL (ref 120–384)

## 2018-10-06 LAB — FERRITIN: Ferritin: 106 ng/mL (ref 11–307)

## 2018-10-06 NOTE — Telephone Encounter (Signed)
Contacted patient and made aware of lab results. She requested a copy of the lab results be sent to her. Labs placed in mail for patient.

## 2018-10-06 NOTE — Telephone Encounter (Signed)
-----   Message from Wyatt Portela, MD sent at 10/06/2018  9:35 AM EDT ----- Please let her know her iron studies are normal.

## 2018-10-08 ENCOUNTER — Telehealth: Payer: Self-pay | Admitting: Family

## 2018-10-08 NOTE — Telephone Encounter (Signed)
Records were faxed to Dr. Dora Sims last week. Per Patient she is already scheduled to see [name] at the end of the month.

## 2018-10-08 NOTE — Telephone Encounter (Signed)
Please contact pt and let her know that I received her records from Dr.  Noon office finally. They are requesting copy's at Dr. Arlean Hopping office before they will consider scheduling her for an office visit. Let her know that we will fax these records to their office and to let us know if she has not heard from their office in 2 weeks re: apt.

## 2018-10-10 NOTE — Progress Notes (Signed)
Office Visit Note  Patient: Amber [name]             Date of Birth: Jun 08, 1960           MRN: [id number]             PCP: [name], NP Referring: [name], NP Visit Date: 10/24/2018 Occupation: LPN  Subjective:  Pain and swelling in multiple joints.   History of Present Illness: Amber [name] is a 58 y.o. female seen in consultation per request of her PCP.  According to patient about 10 years ago she woke up with hand pain and swelling.  At the time she was seen at urgent care [address] and her labs came positive for rheumatoid factor.  She was referred to Dr. Graylon Gunning who diagnosed her with rheumatoid arthritis and is started on Plaquenil.  He also diagnosed her with Sjogren's.  She was under care of [name] for a while and then he left the practice.  She states after that she was seen by a nurse practitioner at that office and was not satisfied with the care she stopped going for the follow-up visits.  She has had no treatment for the last 1 year.  She states she is having pain and swelling in multiple joints.  She complains of pain and discomfort in her cervical spine, lumbar spine, shoulders, elbows, wrist joints and hands.  She also has discomfort in her knee joints, ankles and her feet.  She reports swelling in her hands, ankles and shoulders.  She continues to have sicca symptoms.  Activities of Daily Living:  Patient reports morning stiffness for 1 hour.   Patient Reports nocturnal pain.  Difficulty dressing/grooming: Denies Difficulty climbing stairs: Reports Difficulty getting out of chair: Reports Difficulty using hands for taps, buttons, cutlery, and/or writing: Denies  Review of Systems  Constitutional: Positive for fatigue.  HENT: Positive for mouth dryness. Negative for mouth sores and nose dryness.   Eyes: Positive for dryness.  Respiratory: Negative for shortness of breath and difficulty breathing.   Cardiovascular: Negative  for chest pain and palpitations.  Gastrointestinal: Negative for blood in stool, constipation and diarrhea.  Endocrine: Negative for increased urination.  Genitourinary: Negative for difficulty urinating and painful urination.  Musculoskeletal: Positive for arthralgias, joint pain, joint swelling and morning stiffness.  Skin: Negative for rash.  Allergic/Immunologic: Negative for susceptible to infections.  Neurological: Positive for numbness and headaches. Negative for dizziness, memory loss and weakness.  Hematological: Negative for swollen glands.  Psychiatric/Behavioral: Negative for confusion and sleep disturbance.    PMFS History:  Patient Active Problem List   Diagnosis Date Noted  . Other fatigue 09/04/2017  . Shortness of breath on exertion 09/04/2017  . Essential hypertension 09/04/2017  . Controlled type 2 diabetes mellitus without complication, without long-term current use of insulin ([place]) 05/31/2017  . Chronic diastolic CHF (congestive heart failure) ([place]) 04/24/2017  . CAD (coronary artery disease) 04/24/2017  . Screening-pulmonary TB 06/14/2016  . History of BCG vaccination 06/14/2016  . Fibroids 07/27/2014  . Migraine 04/19/2014  . Nail fungus 04/09/2014  . Elevated serum protein level 04/09/2014  . Preventative health care 04/09/2014  . Iron deficiency anemia 09/02/2013  . Headache(784.0) 03/04/2013  . Obesity (BMI 30-39.9) 10/17/2012  . Palpitations 10/01/2012  . Glaucoma 11/07/2011  . Rheumatoid arthritis ([place]) 11/07/2011  . Borderline diabetes 11/07/2011  . HTN (hypertension) 11/05/2011  . Sjogren's disease ([place]) 11/05/2011  . Anemia 09/22/2010  . Fibroid uterus 09/22/2010  .  Neutropenia 09/22/2010  . Dysmenorrhea 09/15/2010    Past Medical History:  Diagnosis Date  . Anemia   . Arthritis   . Blood transfusion without reported diagnosis   . Colon polyps   . Controlled type 2 diabetes mellitus without complication, without long-term current use of  insulin ([place]) 05/31/2017  . Ectopic pregnancy    RIGHT salpingostomy  . Elective abortion   . GERD (gastroesophageal reflux disease)   . Glaucoma    both eyes  . Headache    migraines  . History of chicken pox   . Hyperlipemia   . Hypertension   . Rheumatoid arthritis(714.0) 11/07/2011  . SAB (spontaneous abortion)   . Shortness of breath   . Sjogren's disease ([place])   . Wheezing     Family History  Problem Relation Age of Onset  . Hypertension Mother        died from heart disease  . Heart disease Mother        deceased, unknown cause  . Stroke Mother   . Obesity Mother   . Diabetes Sister   . Hypertension Sister   . Esophageal cancer Sister   . Stomach cancer Sister   . Cancer Sister   . Diabetes Sister   . Rectal cancer Neg Hx   . Colon cancer Neg Hx    Past Surgical History:  Procedure Laterality Date  . ABDOMINAL SURGERY     bowel repair  . COLONOSCOPY    . DILATION AND CURETTAGE OF UTERUS     X 2  . MENISCUS REPAIR Right    MENISCUS TEAR REPAIR  . MYOMECTOMY N/A 07/27/2014   Procedure: ABDOMINAL MYOMECTOMY;  Surgeon: Waymon Amato, MD;  Location: [facility];  Service: Gynecology;  Laterality: N/A;  . PELVIC LAPAROSCOPY  1994   IN 1999 LAPAROSCOPY WITH INCIDENTAL ENTEROTOMY REQUIRING LAPAROTOMY  . 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  Had a sister up in [place] who passed away 04/18/15 who she was close to.   Born in [place]- she came to [place] as adult.  Age 45.          Immunization History  Administered Date(s) Administered  . Hepatitis A, Adult 11/01/2017  . IPV 11/01/2017  . Influenza Split 11/07/2011  . Influenza,inj,Quad PF,6+ Mos 10/17/2012, 01/15/2014, 08/31/2014, 09/30/2017, 09/10/2018  . Meningococcal Mcv4o 11/01/2017  .  Pneumococcal Polysaccharide-23 09/10/2018  . Tdap 06/01/2011, 11/25/2012  . Typhoid Live 11/01/2017  . Zoster Recombinat (Shingrix) 10/04/2017, 12/04/2017     Objective: Vital Signs: BP (!) 150/83 (BP Location: Right Arm, Patient Position: Sitting, Cuff Size: Large)   Pulse 63   Resp 15   Ht 5' (1.524 m)   Wt 211 lb (95.7 kg)   LMP 08/09/2016   BMI 41.21 kg/m    Physical Exam Vitals signs and nursing note reviewed.  Constitutional:      Appearance: She is well-developed.  HENT:     Head: Normocephalic and atraumatic.  Eyes:     Conjunctiva/sclera: Conjunctivae normal.  Neck:     Musculoskeletal: Normal range of motion.  Cardiovascular:     Rate and Rhythm: Normal rate and regular rhythm.     Heart sounds:  Normal heart sounds.  Pulmonary:     Effort: Pulmonary effort is normal.     Breath sounds: Normal breath sounds.  Abdominal:     General: Bowel sounds are normal.     Palpations: Abdomen is soft.  Lymphadenopathy:     Cervical: No cervical adenopathy.  Skin:    General: Skin is warm and dry.     Capillary Refill: Capillary refill takes less than 2 seconds.  Neurological:     Mental Status: She is alert and oriented to person, place, and time.  Psychiatric:        Behavior: Behavior normal.      Musculoskeletal Exam: C-spine had good range of motion with discomfort.  She had discomfort range of motion of her lumbar spine.  She has good range of motion of her shoulder joints with discomfort.  Elbow joints and wrist joints are in good range of motion.  She has tenderness across the MCPs and PIPs but no synovitis was noted.  Hip joints were in good range of motion.  She has some warmth on palpation of bilateral knee joints.  No warmth is noted over the ankle joints.  She has tenderness across MTPs but no synovitis was noted.  CDAI Exam: CDAI Score: 11.5  Patient Global: 10 mm; Provider Global: 5 mm Swollen: 0 ; Tender: 14  Joint Exam      Right  Left   Glenohumeral   Tender   Tender  Wrist   Tender   Tender  MCP 2   Tender   Tender  MCP 3      Tender  MCP 4   Tender     Cervical Spine   Tender     Knee   Tender   Tender  Ankle   Tender   Tender  MTP 1   Tender        Investigation: No additional findings.  Imaging: Mm 3d Screen Breast Bilateral  Result Date: 10/17/2018 CLINICAL DATA:  Screening. EXAM: DIGITAL SCREENING BILATERAL MAMMOGRAM WITH TOMO AND CAD COMPARISON:  Previous exam(s). ACR Breast Density Category c: The breast tissue is heterogeneously dense, which may obscure small masses. FINDINGS: There are no findings suspicious for malignancy. Images were processed with CAD. IMPRESSION: No mammographic evidence of malignancy. A result letter of this screening mammogram will be mailed directly to the patient. RECOMMENDATION: Screening mammogram in one year. (Code:SM-B-01Y) BI-RADS CATEGORY  1: Negative. Electronically Signed   By: Lovey Newcomer M.D.   On: 10/17/2018 12:38    Recent Labs: Lab Results  Component Value Date   WBC 3.8 (L) 10/03/2018   HGB 13.2 10/03/2018   PLT 208 10/03/2018   NA 137 09/10/2018   K 4.2 09/10/2018   CL 104 09/10/2018   CO2 29 09/10/2018   GLUCOSE 81 09/10/2018   BUN 13 09/10/2018   CREATININE 0.65 09/10/2018   BILITOT 0.6 09/10/2018   ALKPHOS 62 09/10/2018   AST 17 09/10/2018   ALT 20 09/10/2018   PROT 9.1 (H) 09/10/2018   ALBUMIN 4.0 09/10/2018   CALCIUM 9.9 09/10/2018   GFRAA 118 09/04/2017   QFTBGOLD Negative 06/14/2016   QFTBGOLDPLUS NEGATIVE 11/01/2017    Speciality Comments: No specialty comments available.  Procedures:  No procedures performed Allergies: Patient has no known allergies.   Assessment / Plan:     Visit Diagnoses: Rheumatoid arthritis, involving unspecified site, unspecified whether rheumatoid factor present ([place]) -patient gives history of rheumatoid arthritis for the last 10 years.  She  was under care of Dr. Caryl Bis and [name] in the past.  She has  been off medications for the last 1 year.  She was only treated with Plaquenil.  She complains of increased pain stiffness and swelling today.  On examination she had tenderness but no swelling was noted.  I will obtain labs today.  The plan is to start her back on Plaquenil after we have lab results.- Plan: Sedimentation rate, Rheumatoid factor, Cyclic citrul peptide antibody, IgG, 14-3-3 eta Protein, ANA.  As she has been treated with the Plaquenil in the past, we discussed possible use of Plaquenil.  She was in agreement.  A handout was given and consent was taken.  Once we get lab results we may start her on Plaquenil 200 mg p.o. twice daily.  She will also need baseline eye examination and eye examination on regular basis.  Sjogren's syndrome with other organ involvement (HCC)-she gives history of sicca symptoms.  We will obtain ANA, Ro and La today.  Pain in both hands -she complains of pain and swelling in her bilateral hands.  She had tenderness on examination.  Plan: XR Hand 2 View Right, XR Hand 2 View Left.  X-ray of bilateral hands were unremarkable.  Chronic pain of both knees -she has warmth on palpation of her bilateral knee joints.  Plan: XR KNEE 3 VIEW RIGHT, XR KNEE 3 VIEW LEFT.  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  Pain in both feet -she had tenderness on palpation of her ankle joints and MTPs.  Plan: XR Foot 2 Views Right, XR Foot 2 Views Left.  X-rays revealed mild osteoarthritic changes.  High risk medication use - Plan: Hepatitis B core antibody, IgM, Hepatitis B surface antigen, Hepatitis C antibody, HIV Antibody (routine testing w rflx), QuantiFERON-TB Gold Plus, Serum protein electrophoresis with reflex, IgG, IgA, IgM, Glucose 6 phosphate dehydrogenase  Family history of rheumatoid arthritis - Sister  Coronary artery disease involving native coronary artery of native heart without angina pectoris  Chronic diastolic CHF (congestive  heart failure) ([place])  Essential hypertension  Controlled type 2 diabetes mellitus without complication, without long-term current use of insulin (HCC)  Borderline diabetes  Elevated serum protein level  Fibroids  Hx of migraines  Other fatigue  History of BCG vaccination  Palpitations  Orders: Orders Placed This Encounter  Procedures  . XR Hand 2 View Right  . XR Hand 2 View Left  . XR KNEE 3 VIEW RIGHT  . XR KNEE 3 VIEW LEFT  . XR Foot 2 Views Right  . XR Foot 2 Views Left  . Sedimentation rate  . Rheumatoid factor  . Cyclic citrul peptide antibody, IgG  . 14-3-3 eta Protein  . ANA  . Hepatitis B core antibody, IgM  . Hepatitis B surface antigen  . Hepatitis C antibody  . HIV Antibody (routine testing w rflx)  . QuantiFERON-TB Gold Plus  . Serum protein electrophoresis with reflex  . IgG, IgA, IgM  . Glucose 6 phosphate dehydrogenase   No orders of the defined types were placed in this encounter.   Face-to-face time spent with patient was 45 minutes. Greater than 50% of time was spent in counseling and coordination of care.  Follow-Up Instructions: Return for Rheumatoid arthritis.   Bo Merino, MD  Note - This record has been created using Editor, commissioning.  Chart creation errors have been sought, but may not always  have been located. Such creation errors do not reflect  on  the standard of medical care.

## 2018-10-16 ENCOUNTER — Other Ambulatory Visit: Payer: Self-pay

## 2018-10-16 ENCOUNTER — Ambulatory Visit (HOSPITAL_BASED_OUTPATIENT_CLINIC_OR_DEPARTMENT_OTHER)
Admission: RE | Admit: 2018-10-16 | Discharge: 2018-10-16 | Disposition: A | Discharge status: Home / Self Care | Payer: 59 | Source: Ambulatory Visit | Attending: Family | Admitting: Family

## 2018-10-16 DIAGNOSIS — Z0000 Encounter for general adult medical examination without abnormal findings: Secondary | ICD-10-CM

## 2018-10-16 DIAGNOSIS — Z1231 Encounter for screening mammogram for malignant neoplasm of breast: Secondary | ICD-10-CM | POA: Insufficient documentation

## 2018-10-24 ENCOUNTER — Ambulatory Visit (INDEPENDENT_AMBULATORY_CARE_PROVIDER_SITE_OTHER): Payer: 59 | Admitting: Rheumatology

## 2018-10-24 ENCOUNTER — Encounter: Payer: Self-pay | Admitting: Rheumatology

## 2018-10-24 ENCOUNTER — Ambulatory Visit (INDEPENDENT_AMBULATORY_CARE_PROVIDER_SITE_OTHER): Payer: PRIVATE HEALTH INSURANCE

## 2018-10-24 ENCOUNTER — Ambulatory Visit: Payer: Self-pay

## 2018-10-24 ENCOUNTER — Other Ambulatory Visit: Payer: Self-pay

## 2018-10-24 VITALS — BP 150/83 | HR 63 | Resp 15 | Ht 60.0 in | Wt 211.0 lb

## 2018-10-24 DIAGNOSIS — G8929 Other chronic pain: Secondary | ICD-10-CM

## 2018-10-24 DIAGNOSIS — M79672 Pain in left foot: Secondary | ICD-10-CM | POA: Diagnosis not present

## 2018-10-24 DIAGNOSIS — M79641 Pain in right hand: Secondary | ICD-10-CM | POA: Diagnosis not present

## 2018-10-24 DIAGNOSIS — Z79899 Other long term (current) drug therapy: Secondary | ICD-10-CM

## 2018-10-24 DIAGNOSIS — M79671 Pain in right foot: Secondary | ICD-10-CM

## 2018-10-24 DIAGNOSIS — D219 Benign neoplasm of connective and other soft tissue, unspecified: Secondary | ICD-10-CM

## 2018-10-24 DIAGNOSIS — M25561 Pain in right knee: Secondary | ICD-10-CM

## 2018-10-24 DIAGNOSIS — I10 Essential (primary) hypertension: Secondary | ICD-10-CM

## 2018-10-24 DIAGNOSIS — R7303 Prediabetes: Secondary | ICD-10-CM

## 2018-10-24 DIAGNOSIS — M3509 Sicca syndrome with other organ involvement: Secondary | ICD-10-CM

## 2018-10-24 DIAGNOSIS — M25562 Pain in left knee: Secondary | ICD-10-CM | POA: Diagnosis not present

## 2018-10-24 DIAGNOSIS — I5032 Chronic diastolic (congestive) heart failure: Secondary | ICD-10-CM

## 2018-10-24 DIAGNOSIS — I2510 Atherosclerotic heart disease of native coronary artery without angina pectoris: Secondary | ICD-10-CM

## 2018-10-24 DIAGNOSIS — R5383 Other fatigue: Secondary | ICD-10-CM

## 2018-10-24 DIAGNOSIS — R779 Abnormality of plasma protein, unspecified: Secondary | ICD-10-CM

## 2018-10-24 DIAGNOSIS — M069 Rheumatoid arthritis, unspecified: Secondary | ICD-10-CM

## 2018-10-24 DIAGNOSIS — Z8261 Family history of arthritis: Secondary | ICD-10-CM

## 2018-10-24 DIAGNOSIS — R002 Palpitations: Secondary | ICD-10-CM

## 2018-10-24 DIAGNOSIS — M79642 Pain in left hand: Secondary | ICD-10-CM

## 2018-10-24 DIAGNOSIS — Z9229 Personal history of other drug therapy: Secondary | ICD-10-CM

## 2018-10-24 DIAGNOSIS — E119 Type 2 diabetes mellitus without complications: Secondary | ICD-10-CM

## 2018-10-24 DIAGNOSIS — Z8669 Personal history of other diseases of the nervous system and sense organs: Secondary | ICD-10-CM

## 2018-10-24 NOTE — Patient Instructions (Signed)
Hydroxychloroquine tablets What is this medicine? HYDROXYCHLOROQUINE (hye drox ee KLOR oh kwin) is used to treat rheumatoid arthritis and systemic lupus erythematosus. It is also used to treat malaria. This medicine may be used for other purposes; ask your health care provider or pharmacist if you have questions. COMMON BRAND NAME(S): Plaquenil, Quineprox What should I tell my health care provider before I take this medicine? They need to know if you have any of these conditions:  diabetes  eye disease, vision problems  G6PD deficiency  heart disease  history of irregular heartbeat  if you often drink alcohol  kidney disease  liver disease  porphyria  psoriasis  an unusual or allergic reaction to chloroquine, hydroxychloroquine, other medicines, foods, dyes, or preservatives  pregnant or trying to get pregnant  breast-feeding How should I use this medicine? Take this medicine by mouth with a glass of water. Follow the directions on the prescription label. Do not cut, crush or chew this medicine. Swallow the tablets whole. Take this medicine with food. Avoid taking antacids within 4 hours of taking this medicine. It is best to separate these medicines by at least 4 hours. Take your medicine at regular intervals. Do not take it more often than directed. Take all of your medicine as directed even if you think you are better. Do not skip doses or stop your medicine early. Talk to your pediatrician regarding the use of this medicine in children. While this drug may be prescribed for selected conditions, precautions do apply. Overdosage: If you think you have taken too much of this medicine contact a poison control center or emergency room at once. NOTE: This medicine is only for you. Do not share this medicine with others. What if I miss a dose? If you miss a dose, take it as soon as you can. If it is almost time for your next dose, take only that dose. Do not take double or extra  doses. What may interact with this medicine? Do not take this medicine with any of the following medications:  cisapride  dronedarone  pimozide  thioridazine This medicine may also interact with the following medications:  ampicillin  antacids  cimetidine  cyclosporine  digoxin  kaolin  medicines for diabetes, like insulin, glipizide, glyburide  medicines for seizures like carbamazepine, phenobarbital, phenytoin  mefloquine  methotrexate  other medicines that prolong the QT interval (cause an abnormal heart rhythm)  praziquantel This list may not describe all possible interactions. Give your health care provider a list of all the medicines, herbs, non-prescription drugs, or dietary supplements you use. Also tell them if you smoke, drink alcohol, or use illegal drugs. Some items may interact with your medicine. What should I watch for while using this medicine? Visit your health care professional for regular checks on your progress. Tell your health care professional if your symptoms do not start to get better or if they get worse. You may need blood work done while you are taking this medicine. If you take other medicines that can affect heart rhythm, you may need more testing. Talk to your health care professional if you have questions. Your vision may be tested before and during use of this medicine. Tell your health care professional right away if you have any change in your eyesight. What side effects may I notice from receiving this medicine? Side effects that you should report to your doctor or health care professional as soon as possible:  allergic reactions like skin rash, itching or hives,   swelling of the face, lips, or tongue  changes in vision  decreased hearing or ringing of the ears  muscle weakness  redness, blistering, peeling or loosening of the skin, including inside the mouth  sensitivity to light  signs and symptoms of a dangerous change in  heartbeat or heart rhythm like chest pain; dizziness; fast or irregular heartbeat; palpitations; feeling faint or lightheaded, falls; breathing problems  signs and symptoms of liver injury like dark yellow or brown urine; general ill feeling or flu-like symptoms; light-colored stools; loss of appetite; nausea; right upper belly pain; unusually weak or tired; yellowing of the eyes or skin  signs and symptoms of low blood sugar such as feeling anxious; confusion; dizziness; increased hunger; unusually weak or tired; sweating; shakiness; cold; irritable; headache; blurred vision; fast heartbeat; loss of consciousness  suicidal thoughts  uncontrollable head, mouth, neck, arm, or leg movements Side effects that usually do not require medical attention (report to your doctor or health care professional if they continue or are bothersome):  diarrhea  dizziness  hair loss  headache  irritable  loss of appetite  nausea, vomiting  stomach pain This list may not describe all possible side effects. Call your doctor for medical advice about side effects. You may report side effects to FDA at 1-800-FDA-1088. Where should I keep my medicine? Keep out of the reach of children. Store at room temperature between 15 and 30 degrees C (59 and 86 degrees F). Protect from moisture and light. Throw away any unused medicine after the expiration date. NOTE: This sheet is a summary. It may not cover all possible information. If you have questions about this medicine, talk to your doctor, pharmacist, or health care provider.  2020 Elsevier/Gold Standard (2018-04-28 12:56:32)  

## 2018-10-27 ENCOUNTER — Other Ambulatory Visit: Payer: Self-pay | Admitting: Rheumatology

## 2018-10-30 NOTE — Progress Notes (Signed)
Office Visit Note  Patient: Amber [name]             Date of Birth: 1960-06-02           MRN: [id number]             PCP: [name], NP Referring: [name], NP Visit Date: 11/12/2018 Occupation: _0 @  Subjective:  Pain in multiple joints   History of Present Illness: Amber [name] is a 58 y.o. female with history of seropositive rheumatoid arthritis, osteoarthritis, and Sjogren's syndrome.  She has been having increased pain in both wrist joints and both ankle joints. She has intermittent swelling in both wrist joints and both ankle joints. She has also started having increased lower back pain.  She takes Aleve 2 tablets every 6 hours for pain relief.     Activities of Daily Living:  Patient reports morning stiffness for 5-10 minutes.   Patient Reports nocturnal pain.  Difficulty dressing/grooming: Denies Difficulty climbing stairs: Reports Difficulty getting out of chair: Reports Difficulty using hands for taps, buttons, cutlery, and/or writing: Reports  Review of Systems  Constitutional: Positive for fatigue.  HENT: Positive for mouth dryness and nose dryness. Negative for mouth sores.   Eyes: Positive for dryness. Negative for pain and visual disturbance.  Respiratory: Negative for cough, hemoptysis, shortness of breath, wheezing and difficulty breathing.   Cardiovascular: Negative for chest pain, palpitations, hypertension and swelling in legs/feet.  Gastrointestinal: Negative for abdominal pain, blood in stool, constipation and diarrhea.  Endocrine: Negative for increased urination.  Genitourinary: Negative for difficulty urinating and painful urination.  Musculoskeletal: Positive for arthralgias, joint pain, joint swelling and morning stiffness. Negative for myalgias, muscle weakness, muscle tenderness and myalgias.  Skin: Negative for color change, pallor, rash, hair loss, nodules/bumps, skin tightness, ulcers and sensitivity to  sunlight.  Allergic/Immunologic: Negative for susceptible to infections.  Neurological: Positive for headaches. Negative for dizziness, numbness, memory loss and weakness.  Hematological: Negative for bruising/bleeding tendency and swollen glands.  Psychiatric/Behavioral: Negative for depressed mood, confusion and sleep disturbance. The patient is not nervous/anxious.     PMFS History:  Patient Active Problem List   Diagnosis Date Noted   Other fatigue 09/04/2017   Shortness of breath on exertion 09/04/2017   Essential hypertension 09/04/2017   Controlled type 2 diabetes mellitus without complication, without long-term current use of insulin ([place]) 05/31/2017   Chronic diastolic CHF (congestive heart failure) ([place]) 04/24/2017   CAD (coronary artery disease) 04/24/2017   Screening-pulmonary TB 06/14/2016   History of BCG vaccination 06/14/2016   Fibroids 07/27/2014   Migraine 04/19/2014   Nail fungus 04/09/2014   Elevated serum protein level 04/09/2014   Preventative health care 04/09/2014   Iron deficiency anemia 09/02/2013   Headache(784.0) 03/04/2013   Obesity (BMI 30-39.9) 10/17/2012   Palpitations 10/01/2012   Glaucoma 11/07/2011   Rheumatoid arthritis ([place]) 11/07/2011   Borderline diabetes 11/07/2011   HTN (hypertension) 11/05/2011   Sjogren's disease ([place]) 11/05/2011   Anemia 09/22/2010   Fibroid uterus 09/22/2010   Neutropenia 09/22/2010   Dysmenorrhea 09/15/2010    Past Medical History:  Diagnosis Date   Anemia    Arthritis    Blood transfusion without reported diagnosis    Colon polyps    Controlled type 2 diabetes mellitus without complication, without long-term current use of insulin ([place]) 05/31/2017   Ectopic pregnancy    RIGHT salpingostomy   Elective abortion    GERD (gastroesophageal reflux disease)    Glaucoma  both eyes   Headache    migraines   History of chicken pox    Hyperlipemia    Hypertension     Rheumatoid arthritis(714.0) 11/07/2011   SAB (spontaneous abortion)    Shortness of breath    Sjogren's disease ([place])    Wheezing     Family History  Problem Relation Age of Onset   Hypertension Mother        died from heart disease   Heart disease Mother        deceased, unknown cause   Stroke Mother    Obesity Mother    Diabetes Sister    Hypertension Sister    Esophageal cancer Sister    Stomach cancer Sister    Cancer Sister    Diabetes Sister    Rectal cancer Neg Hx    Colon cancer Neg Hx    Past Surgical History:  Procedure Laterality Date   ABDOMINAL SURGERY     bowel repair   COLONOSCOPY     DILATION AND CURETTAGE OF UTERUS     X 2   MENISCUS REPAIR Right    MENISCUS TEAR REPAIR   MYOMECTOMY N/A 07/27/2014   Procedure: ABDOMINAL MYOMECTOMY;  Surgeon: Waymon Amato, MD;  Location: [facility];  Service: Gynecology;  Laterality: N/A;   PELVIC LAPAROSCOPY  1994   IN 1999 LAPAROSCOPY WITH INCIDENTAL ENTEROTOMY REQUIRING LAPAROTOMY   UTERINE FIBROID SURGERY     July 2016   Social History   Social History Narrative   Regular exercise:  No   Caffeine Use: 2 cups coffee daily   No children   "Happily divorced"   Reports that she is involved at her church   Works as an Therapist, 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  Had a sister up in [place] who passed away February 16, 2015 who she was close to.   Born [place]- she came to [place] as adult.  Age 50.          Immunization History  Administered Date(s) Administered   Hepatitis A, Adult 11/01/2017   IPV 11/01/2017   Influenza Split 11/07/2011   Influenza,inj,Quad PF,6+ Mos 10/17/2012, 01/15/2014, 08/31/2014, 09/30/2017, 09/10/2018   Meningococcal Mcv4o 11/01/2017   Pneumococcal Polysaccharide-23 09/10/2018   Tdap 06/01/2011, 11/25/2012   Typhoid Live 11/01/2017   Zoster Recombinat (Shingrix) 10/04/2017, 12/04/2017     Objective: Vital Signs: BP 137/85 (BP Location: Right Wrist, Patient Position: Sitting, Cuff  Size: Normal)    Pulse 67    Resp 13    Ht 5' 1" (1.549 m)    Wt 209 lb 3.2 oz (94.9 kg)    LMP 08/09/2016    BMI 39.53 kg/m    Physical Exam Vitals signs and nursing note reviewed.  Constitutional:      Appearance: She is well-developed.  HENT:     Head: Normocephalic and atraumatic.  Eyes:     Conjunctiva/sclera: Conjunctivae normal.  Neck:     Musculoskeletal: Normal range of motion.  Cardiovascular:     Rate and Rhythm: Normal rate and regular rhythm.     Heart sounds: Normal heart sounds.  Pulmonary:     Effort: Pulmonary effort is normal.     Breath sounds: Normal breath sounds.  Abdominal:     General: Bowel sounds are normal.     Palpations: Abdomen is soft.  Lymphadenopathy:     Cervical: No cervical adenopathy.  Skin:    General: Skin is warm and dry.     Capillary Refill: Capillary refill takes  less than 2 seconds.  Neurological:     Mental Status: She is alert and oriented to person, place, and time.  Psychiatric:        Behavior: Behavior normal.      Musculoskeletal Exam: C-spine slightly limited range of motion.  Thoracic and lumbar spine good range of motion.  Midline spinal tenderness in lumbar region.  No SI joint tenderness.  Shoulder joints and elbow joints have good range of motion and discomfort.  She has limited range of motion bilateral strength with discomfort.  She is tenderness and synovitis of both wrists.  She has synovitis of several MCP joints as described below.  She has complete fist formation bilaterally.  Hip joints have good range of motion with no discomfort.  Knee joints have good range of motion with no warmth or effusion.  She has tenderness of bilateral ankle joints  CDAI Exam: CDAI Score: 15.5  Patient Global: 9 mm; Provider Global: 6 mm Swollen: 7 ; Tender: 9  Joint Exam      Right  Left  Wrist  Swollen Tender  Swollen Tender  MCP 1  Swollen Tender     MCP 2  Swollen Tender     MCP 3  Swollen Tender  Swollen Tender  MCP 4   Swollen Tender     Ankle   Tender   Tender     Investigation: No additional findings.  Imaging: Mm 3d Screen Breast Bilateral  Result Date: 10/17/2018 CLINICAL DATA:  Screening. EXAM: DIGITAL SCREENING BILATERAL MAMMOGRAM WITH TOMO AND CAD COMPARISON:  Previous exam(s). ACR Breast Density Category c: The breast tissue is heterogeneously dense, which may obscure small masses. FINDINGS: There are no findings suspicious for malignancy. Images were processed with CAD. IMPRESSION: No mammographic evidence of malignancy. A result letter of this screening mammogram will be mailed directly to the patient. RECOMMENDATION: Screening mammogram in one year. (Code:SM-B-01Y) BI-RADS CATEGORY  1: Negative. Electronically Signed   By: Lovey Newcomer M.D.   On: 10/17/2018 12:38   Xr Foot 2 Views Left  Result Date: 10/24/2018 First MTP, PIP and DIP narrowing was noted.  No intertarsal or tibiotalar joint space narrowing was noted.  Inferior and posterior calcaneal spurs were noted. Impression: These findings are consistent with osteoarthritis of the foot.  Xr Foot 2 Views Right  Result Date: 10/24/2018 First MTP, PIP and DIP narrowing was noted.  No intertarsal or tibiotalar joint space narrowing was noted.  Inferior and posterior calcaneal spurs were noted. Impression: These findings are consistent with osteoarthritis of the foot.  Xr Hand 2 View Left  Result Date: 10/24/2018 No MCP PIP or DIP narrowing was noted.  No intercarpal radiocarpal joint space narrowing was noted.  No erosive changes were noted. Impression: Unremarkable x-ray of the hand.  Xr Hand 2 View Right  Result Date: 10/24/2018 No MCP PIP or DIP narrowing was noted.  No intercarpal radiocarpal joint space narrowing was noted.  No erosive changes were noted. Impression: Unremarkable x-ray of the hand.  Xr Knee 3 View Left  Result Date: 10/24/2018 Mild medial compartment narrowing was noted.  Moderate patellofemoral narrowing was  noted. Impression: These findings are consistent with moderate osteoarthritis and mild chondromalacia patella.  Xr Knee 3 View Right  Result Date: 10/24/2018 Mild medial compartment narrowing was noted.  Moderate patellofemoral narrowing was noted. Impression: These findings are consistent with moderate osteoarthritis and mild chondromalacia patella.   Recent Labs: Lab Results  Component Value Date   WBC 3.8 (  L) 10/03/2018   HGB 13.8 10/27/2018   PLT 208 10/03/2018   NA 137 09/10/2018   K 4.2 09/10/2018   CL 104 09/10/2018   CO2 29 09/10/2018   GLUCOSE 81 09/10/2018   BUN 13 09/10/2018   CREATININE 0.65 09/10/2018   BILITOT 0.6 09/10/2018   ALKPHOS 62 09/10/2018   AST 17 09/10/2018   ALT 20 09/10/2018   PROT 9.2 (H) 10/27/2018   ALBUMIN 4.0 09/10/2018   CALCIUM 9.9 09/10/2018   GFRAA 118 09/04/2017   QFTBGOLD Negative 06/14/2016   QFTBGOLDPLUS Negative 10/27/2018   October 27, 2018 IgG elevated, IFE showed polyclonal increase, TB Gold negative, hepatitis B-, hepatitis C negative, HIV negative, ESR 13, RF 22.3+, anti-CCP negative, _0 eta pending, ANA pending, SSA positive, SSB positive, beta-2 negative, G6PD normal  Speciality Comments: No specialty comments available.  Procedures:  No procedures performed Allergies: Patient has no known allergies.   Assessment / Plan:     Visit Diagnoses: Rheumatoid arthritis involving multiple sites with positive rheumatoid factor (HCC) - Positive RF, negative anti-CCP, _1 eta positive, history of rheumatoid arthritis for the last 10 years.  She was under care of Dr. Caryl Bis and [name]: She has synovitis and tenderness of multiple joints as described above. She has been having significant discomfort in both hands, both wrist joints, and both ankle joints.  She will be starting on Plaquenil 200 mg 1 tablet by mouth twice daily M-F.  Indications, contraindications, potential side effects of Plaquenil were discussed.  All  questions were addressed and consent was obtained today.  She previously took Plaquenil and tolerated it in the past. She was given a prednisone taper starting at 20 mg tapering by 5 mg every 4 days.  She will follow-up in about 6 weeks to assess her response.    Patient was counseled on the purpose, proper use, and adverse effects of hydroxychloroquine including nausea/diarrhea, skin rash, headaches, and sun sensitivity.  Discussed importance of annual eye exams while on hydroxychloroquine to monitor to ocular toxicity and discussed importance of frequent laboratory monitoring.  Provided patient with eye exam form for baseline ophthalmologic exam.  Provided patient with educational materials on hydroxychloroquine and answered all questions.  Patient consented to hydroxychloroquine.  Will upload consent in the media tab.    Dose will be Plaquenil 200 mg twice daily Monday through Friday.  Prescription pending lab results.  Sjogren's syndrome with other organ involvement ([place]) - Anti-Ro positive, anti-La positive:   High risk medication use - She will be started on Plaquenil 200 mg 1 tablet by mouth twice daily.  She will return for lab work in 1 month in 3 months and every 5 months.  SPEP/IFE-polyclonal pattern, IgG and IgA are elevated.  Primary osteoarthritis of both knees - Bilateral mild osteoarthritis and moderate chondromalacia patella.  She is occasional discomfort in bilateral knee joints.  She has good range of motion exam.  No warmth or effusion was noted.  Primary osteoarthritis of both feet: She has tenderness of bilateral ankle joints.  She has good range of motion.  No synovitis was noted.  Other medical conditions are listed as follows:   Family history of rheumatoid arthritis - In her sister  Coronary artery disease involving native coronary artery of native heart without angina pectoris  Chronic diastolic CHF (congestive heart failure) ([place])  Essential  hypertension  Controlled type 2 diabetes mellitus without complication, without long-term current use of insulin (HCC)  Hx of  migraines  Other fatigue  Palpitations  Orders: Orders Placed This Encounter  Procedures   CBC with Differential/Platelet   COMPLETE METABOLIC PANEL WITH GFR   Meds ordered this encounter  Medications   hydroxychloroquine (PLAQUENIL) 200 MG tablet    Sig: Take 1 tablet 200 mg BID Monday-Friday    Dispense:  120 tablet    Refill:  0   predniSONE (DELTASONE) 5 MG tablet    Sig: Take 2 tablets by mouth daily x4 days, 1.5 tablets by mouth daily x4 days, 1 tablet by mouth daily x4 days, 1/2 tablet by mouth daily x4 days.    Dispense:  20 tablet    Refill:  0    Face-to-face time spent with patient was 30 minutes. Greater than 50% of time was spent in counseling and coordination of care.  Follow-Up Instructions: Return in about 6 weeks (around 12/24/2018) for Rheumatoid arthritis, Sjogren's syndrome, Osteoarthritis.   Ofilia Neas, PA-C   I examined and evaluated the patient with Hazel Sams PA.  Patient had some synovitis on my examination and tenderness as described above.  She was given a prednisone taper and was a started on Plaquenil.  Indications side effects contraindications were discussed at length.  The plan of care was discussed as noted above.  Bo Merino, MD  Note - This record has been created using Editor, commissioning.  Chart creation errors have been sought, but may not always  have been located. Such creation errors do not reflect on  the standard of medical care.

## 2018-11-07 LAB — STATUS REPORT

## 2018-11-08 LAB — MULTIPLE MYELOMA PANEL, SERUM
Albumin SerPl Elph-Mcnc: 3.7 g/dL (ref 2.9–4.4)
Albumin/Glob SerPl: 0.7 (ref 0.7–1.7)
Alpha 1: 0.2 g/dL (ref 0.0–0.4)
Alpha2 Glob SerPl Elph-Mcnc: 0.7 g/dL (ref 0.4–1.0)
B-Globulin SerPl Elph-Mcnc: 1.2 g/dL (ref 0.7–1.3)
Gamma Glob SerPl Elph-Mcnc: 3.4 g/dL — ABNORMAL HIGH (ref 0.4–1.8)
Globulin, Total: 5.5 g/dL — ABNORMAL HIGH (ref 2.2–3.9)
IgA/Immunoglobulin A, Serum: 596 mg/dL — ABNORMAL HIGH (ref 87–352)
IgG (Immunoglobin G), Serum: 4037 mg/dL — ABNORMAL HIGH (ref 586–1602)
IgM (Immunoglobulin M), Srm: 63 mg/dL (ref 26–217)
Total Protein: 9.2 g/dL — ABNORMAL HIGH (ref 6.0–8.5)

## 2018-11-08 LAB — BETA-2-GLYCOPROTEIN I ABS, IGG/M/A
Beta-2 Glyco 1 IgA: 14 GPI IgA units (ref 0–25)
Beta-2 Glyco 1 IgM: <9 GPI IgM units (ref 0–32)
Beta-2 Glyco I IgG: <9 GPI IgG units (ref 0–20)

## 2018-11-08 LAB — ANA 12 PLUS PROFILE, POSITIVE
Anti-Centromere Ab (RDL): <1:40 {titer}
Speckled Pattern: >1:1280 {titer} — AB

## 2018-11-08 LAB — RHEUMATOID FACTOR: Rheumatoid fact SerPl-aCnc: 22.3 IU/mL — ABNORMAL HIGH (ref 0.0–13.9)

## 2018-11-08 LAB — QUANTIFERON-TB GOLD PLUS
QuantiFERON Mitogen Value: >10 IU/mL
QuantiFERON Nil Value: 0.02 IU/mL
QuantiFERON TB1 Ag Value: 0.03 IU/mL
QuantiFERON TB2 Ag Value: 0.03 IU/mL
QuantiFERON-TB Gold Plus: NEGATIVE

## 2018-11-08 LAB — GLUCOSE 6 PHOSPHATE DEHYDROGENASE
G-6-PD, Quant: 8.8 U/g{Hb} (ref 5.5–14.2)
Hemoglobin: 13.8 g/dL (ref 11.1–15.9)

## 2018-11-08 LAB — 14.3.3 ETA, RHEUM. ARTHRITIS: 14.3.3 ETA, Rheum. Arthritis: 0.26 ng/mL — ABNORMAL HIGH

## 2018-11-08 LAB — SJOGREN'S SYNDROME ANTIBODS(SSA + SSB)
ENA SSA (RO) Ab: >8 AI — ABNORMAL HIGH (ref 0.0–0.9)
ENA SSB (LA) Ab: >8 AI — ABNORMAL HIGH (ref 0.0–0.9)

## 2018-11-08 LAB — HEPATITIS C ANTIBODY: Hep C Virus Ab: <0.1 s/co ratio (ref 0.0–0.9)

## 2018-11-08 LAB — ANA 12 PLUS PROFILE (RDL): Anti-Nuclear Ab by IFA (RDL): POSITIVE — AB

## 2018-11-08 LAB — SEDIMENTATION RATE: Sed Rate: 13 mm/hr (ref 0–40)

## 2018-11-08 LAB — CYCLIC CITRUL PEPTIDE ANTIBODY, IGG/IGA: Cyclic Citrullin Peptide Ab: 8 units (ref 0–19)

## 2018-11-08 LAB — HIV ANTIBODY (ROUTINE TESTING W REFLEX): HIV Screen 4th Generation wRfx: NONREACTIVE

## 2018-11-08 LAB — HBSAB QUANT HBIG ASSESSMENT: HBsAb Quant HBIG Assessment: 350.5 m[IU]/mL

## 2018-11-08 LAB — HEPATITIS B CORE ANTIBODY, IGM: Hep B C IgM: NEGATIVE

## 2018-11-11 ENCOUNTER — Ambulatory Visit: Payer: PRIVATE HEALTH INSURANCE | Admitting: Rheumatology

## 2018-11-12 ENCOUNTER — Other Ambulatory Visit: Payer: Self-pay

## 2018-11-12 ENCOUNTER — Encounter: Payer: Self-pay | Admitting: Rheumatology

## 2018-11-12 ENCOUNTER — Ambulatory Visit: Payer: PRIVATE HEALTH INSURANCE | Admitting: Rheumatology

## 2018-11-12 VITALS — BP 137/85 | HR 67 | Resp 13 | Ht 61.0 in | Wt 209.2 lb

## 2018-11-12 DIAGNOSIS — M3509 Sicca syndrome with other organ involvement: Secondary | ICD-10-CM

## 2018-11-12 DIAGNOSIS — Z79899 Other long term (current) drug therapy: Secondary | ICD-10-CM

## 2018-11-12 DIAGNOSIS — M19071 Primary osteoarthritis, right ankle and foot: Secondary | ICD-10-CM

## 2018-11-12 DIAGNOSIS — Z8669 Personal history of other diseases of the nervous system and sense organs: Secondary | ICD-10-CM

## 2018-11-12 DIAGNOSIS — M170 Bilateral primary osteoarthritis of knee: Secondary | ICD-10-CM

## 2018-11-12 DIAGNOSIS — M0579 Rheumatoid arthritis with rheumatoid factor of multiple sites without organ or systems involvement: Secondary | ICD-10-CM

## 2018-11-12 DIAGNOSIS — I2510 Atherosclerotic heart disease of native coronary artery without angina pectoris: Secondary | ICD-10-CM

## 2018-11-12 DIAGNOSIS — R5383 Other fatigue: Secondary | ICD-10-CM

## 2018-11-12 DIAGNOSIS — R002 Palpitations: Secondary | ICD-10-CM

## 2018-11-12 DIAGNOSIS — I5032 Chronic diastolic (congestive) heart failure: Secondary | ICD-10-CM

## 2018-11-12 DIAGNOSIS — M19072 Primary osteoarthritis, left ankle and foot: Secondary | ICD-10-CM

## 2018-11-12 DIAGNOSIS — E119 Type 2 diabetes mellitus without complications: Secondary | ICD-10-CM

## 2018-11-12 DIAGNOSIS — Z8261 Family history of arthritis: Secondary | ICD-10-CM

## 2018-11-12 DIAGNOSIS — I10 Essential (primary) hypertension: Secondary | ICD-10-CM

## 2018-11-12 MED ORDER — HYDROXYCHLOROQUINE SULFATE 200 MG PO TABS: ORAL_TABLET | ORAL | 0 refills | Status: DC

## 2018-11-12 MED ORDER — PREDNISONE 5 MG PO TABS: ORAL_TABLET | ORAL | 0 refills | Status: DC

## 2018-11-12 NOTE — Progress Notes (Signed)
Pharmacy Note  Subjective: Patient presents today to [facility] to see [name].  Patient seen by the pharmacist for counseling on hydroxychloroquine rheumatoid arthritis.  She has taken Plaquenil in the past from another provider and tolerated well.  Objective: CMP     Component Value Date/Time   NA 137 09/10/2018 1040   NA 140 09/04/2017 1124   NA 138 01/21/2015 0935   K 4.2 09/10/2018 1040   K 3.9 01/21/2015 0935   CL 104 09/10/2018 1040   CL 108 (H) 06/25/2012 1508   CO2 29 09/10/2018 1040   CO2 25 01/21/2015 0935   GLUCOSE 81 09/10/2018 1040   GLUCOSE 128 01/21/2015 0935   GLUCOSE 107 (H) 06/25/2012 1508   BUN 13 09/10/2018 1040   BUN 10 09/04/2017 1124   BUN 7.0 01/21/2015 0935   CREATININE 0.65 09/10/2018 1040   CREATININE 0.8 01/21/2015 0935   CALCIUM 9.9 09/10/2018 1040   CALCIUM 9.4 01/21/2015 0935   PROT 9.2 (H) 10/27/2018 1145   PROT 9.2 (H) 01/21/2015 0935   ALBUMIN 4.0 09/10/2018 1040   ALBUMIN 4.4 09/04/2017 1124   ALBUMIN 3.5 01/21/2015 0935   AST 17 09/10/2018 1040   AST 17 01/21/2015 0935   ALT 20 09/10/2018 1040   ALT 15 01/21/2015 0935   ALKPHOS 62 09/10/2018 1040   ALKPHOS 54 01/21/2015 0935   BILITOT 0.6 09/10/2018 1040   BILITOT 1.0 09/04/2017 1124   BILITOT 0.55 01/21/2015 0935   GFRNONAA 102 09/04/2017 1124   GFRNONAA >89 10/01/2012 1412   GFRAA 118 09/04/2017 1124   GFRAA >89 10/01/2012 1412    CBC    Component Value Date/Time   WBC 3.8 (L) 10/03/2018 1512   WBC 3.2 (L) 09/10/2018 1040   RBC 4.65 10/03/2018 1512   HGB 13.8 10/27/2018 1145   HGB 12.4 09/13/2016 0935   HCT 41.2 10/03/2018 1512   HCT 39.6 09/13/2016 0935   PLT 208 10/03/2018 1512   PLT 194 09/13/2016 0935   MCV 88.6 10/03/2018 1512   MCV 87.8 09/13/2016 0935   MCH 28.4 10/03/2018 1512   MCHC 32.0 10/03/2018 1512   RDW 14.0 10/03/2018 1512   RDW 14.4 09/13/2016 0935   LYMPHSABS 2.3 10/03/2018 1512   LYMPHSABS 1.4 09/13/2016 0935   MONOABS 0.3  10/03/2018 1512   MONOABS 0.2 09/13/2016 0935   EOSABS 0.1 10/03/2018 1512   EOSABS 0.1 09/13/2016 0935   BASOSABS 0.0 10/03/2018 1512   BASOSABS 0.0 09/13/2016 0935    Assessment/Plan: Patient was counseled on the purpose, proper use, and adverse effects of hydroxychloroquine including nausea/diarrhea, skin rash, headaches, and sun sensitivity.  Discussed importance of annual eye exams while on hydroxychloroquine to monitor to ocular toxicity and discussed importance of frequent laboratory monitoring.  Provided patient with eye exam form for baseline ophthalmologic exam and standing lab instructions.  Provided patient with educational materials on hydroxychloroquine and answered all questions.  Patient consented to hydroxychloroquine.  Will upload consent in the media tab.    Dose will be Plaquenil 200 mg twice daily Monday through Friday based on weight of 94.9 kg and height of 5'1".   All questions encouraged and answered.  Instructed patient to call with any questions.  Mariella Saa, PharmD, [place], [place] [facility] Pharmacist [phone]  11/12/2018 10:15 AM

## 2018-11-26 ENCOUNTER — Telehealth: Payer: Self-pay | Admitting: *Deleted

## 2018-11-26 NOTE — Telephone Encounter (Signed)
Copied from [place] [id number]. Topic: General - Inquiry >> Nov 26, 2018  3:29 PM Alease Frame wrote: Reason for CRM: Patient would like a call back regarding some vaccines she would need to get before traveling

## 2018-11-26 NOTE — Telephone Encounter (Signed)
Patient has traveling plans for 12-22-2018,was scheduled to be seen 12-03-2018

## 2018-12-02 ENCOUNTER — Other Ambulatory Visit: Payer: Self-pay

## 2018-12-03 ENCOUNTER — Encounter: Payer: Self-pay | Admitting: Family

## 2018-12-03 ENCOUNTER — Ambulatory Visit (INDEPENDENT_AMBULATORY_CARE_PROVIDER_SITE_OTHER): Payer: PRIVATE HEALTH INSURANCE | Admitting: Family

## 2018-12-03 VITALS — BP 158/87 | HR 58 | Temp 96.5°F | Resp 16 | Ht 61.0 in | Wt 207.0 lb

## 2018-12-03 DIAGNOSIS — Z23 Encounter for immunization: Secondary | ICD-10-CM

## 2018-12-03 DIAGNOSIS — Z7184 Encounter for health counseling related to travel: Secondary | ICD-10-CM

## 2018-12-03 MED ORDER — CIPROFLOXACIN HCL 500 MG PO TABS: 500.0000 mg | ORAL_TABLET | Freq: Two times a day (BID) | ORAL | 0 refills | Status: AC | PRN

## 2018-12-03 MED ORDER — TYPHOID VACCINE PO CPDR: DELAYED_RELEASE_CAPSULE | ORAL | 0 refills | Status: DC

## 2018-12-03 MED ORDER — ATOVAQUONE-PROGUANIL HCL 250-100 MG PO TABS: ORAL_TABLET | ORAL | 0 refills | Status: DC

## 2018-12-03 NOTE — Patient Instructions (Signed)
Please begin malarone and typhoid tablets as directed. Get your Yellow fever vaccine from the health department or the travel clinic. Have a safe trip.

## 2018-12-03 NOTE — Progress Notes (Signed)
Subjective:    Patient ID: Amber [name], female    DOB: [birth year], 58 y.o.   MRN: [id number]  HPI  Patient is a 58 year old female who presents today prior to scheduled travel to [place] on December 21, 2018.  She would like to update her vaccinations.  She has no complaints today.    Review of Systems See HPI  Past Medical History:  Diagnosis Date   Anemia    Arthritis    Blood transfusion without reported diagnosis    Colon polyps    Controlled type 2 diabetes mellitus without complication, without long-term current use of insulin ([place]) 05/31/2017   Ectopic pregnancy    RIGHT salpingostomy   Elective abortion    GERD (gastroesophageal reflux disease)    Glaucoma    both eyes   Headache    migraines   History of chicken pox    Hyperlipemia    Hypertension    Rheumatoid arthritis(714.0) 11/07/2011   SAB (spontaneous abortion)    Shortness of breath    Sjogren's disease ([place])    Wheezing      Social History   Socioeconomic History   Marital status: Single    Spouse name: Not on file   Number of children: 0   Years of education: Not on file   Highest education level: Not on file  Occupational History   Occupation: LPN    Employer: [employer]  Social Needs   Financial resource strain: Not on file   Food insecurity    Worry: Not on file    Inability: Not on file   Transportation needs    Medical: Not on file    Non-medical: Not on file  Tobacco Use   Smoking status: Never Smoker   Smokeless tobacco: Never Used  Substance and Sexual Activity   Alcohol use: No    Alcohol/week: 0.0 standard drinks   Drug use: No   Sexual activity: Yes    Birth control/protection: None    Comment: 1st intercourse- 21, partners- 5  Lifestyle   Physical activity    Days per week: Not on file    Minutes per session: Not on file   Stress: Not on file  Relationships   Social connections    Talks on phone: Not on file    Gets  together: Not on file    Attends religious service: Not on file    Active member of club or organization: Not on file    Attends meetings of clubs or organizations: Not on file    Relationship status: Not on file   Intimate partner violence    Fear of current or ex partner: Not on file    Emotionally abused: Not on file    Physically abused: Not on file    Forced sexual activity: Not on file  Other Topics Concern   Not on file  Social History Narrative   Regular exercise:  No   Caffeine Use: 2 cups coffee daily   No children   "Happily divorced"   Reports that she is involved at her church   Works as an Therapist, 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  Had a sister up in [place] who passed away 03/29/15 who she was close to.   Born in [place]- she came to [place] as adult.  Age 89.           Past Surgical History:  Procedure Laterality Date   ABDOMINAL SURGERY     bowel repair  COLONOSCOPY     DILATION AND CURETTAGE OF UTERUS     X 2   MENISCUS REPAIR Right    MENISCUS TEAR REPAIR   MYOMECTOMY N/A 07/27/2014   Procedure: ABDOMINAL MYOMECTOMY;  Surgeon: Waymon Amato, MD;  Location: [facility];  Service: Gynecology;  Laterality: N/A;   PELVIC LAPAROSCOPY  1994   IN 1999 LAPAROSCOPY WITH INCIDENTAL ENTEROTOMY REQUIRING LAPAROTOMY   UTERINE FIBROID SURGERY     July 2016    Family History  Problem Relation Age of Onset   Hypertension Mother        died from heart disease   Heart disease Mother        deceased, unknown cause   Stroke Mother    Obesity Mother    Diabetes Sister    Hypertension Sister    Esophageal cancer Sister    Stomach cancer Sister    Cancer Sister    Diabetes Sister    Rectal cancer Neg Hx    Colon cancer Neg Hx     No Known Allergies  Current Outpatient Medications on File Prior to Visit  Medication Sig Dispense Refill   albuterol (VENTOLIN HFA) 108 (90 Base) MCG/ACT inhaler Inhale 2 puffs into the lungs every 6 (six) hours as needed for wheezing  or shortness of breath. 18 g 5   aspirin EC 81 MG tablet Take 1 tablet (81 mg total) by mouth daily.     atorvastatin (LIPITOR) 40 MG tablet Take 1 tablet (40 mg total) by mouth daily. Please call and a schedule an appt for further refills. 1st attempt 30 tablet 2   Cyanocobalamin (VITAMIN B-12) 5000 MCG LOZG Take 1 tablet by mouth 2 (two) times daily.     diltiazem (CARTIA XT) 120 MG 24 hr capsule Take 1 capsule (120 mg total) by mouth daily. Please call and schedule an appt for further refills. 1st attempt 30 capsule 2   ferrous sulfate 325 (65 FE) MG tablet Take 325 mg by mouth 3 (three) times daily with meals.      fluticasone (FLONASE) 50 MCG/ACT nasal spray Place 2 sprays into both nostrils daily. 16 g 1   Fluticasone-Salmeterol (ADVAIR) 250-50 MCG/DOSE AEPB Inhale 1 puff into the lungs 2 (two) times daily. 60 each 5   furosemide (LASIX) 20 MG tablet TAKE 1 TABLET BY MOUTH ONCE DAILY AS NEEDED FOR  FLUID 90 tablet 0   hydrochlorothiazide (HYDRODIURIL) 25 MG tablet Take 1 tablet (25 mg total) by mouth daily. Please call and schedule an appt for further refills.1st attempt 30 tablet 2   hydroxychloroquine (PLAQUENIL) 200 MG tablet Take 1 tablet 200 mg BID Monday-Friday 120 tablet 0   lisinopril (ZESTRIL) 40 MG tablet Take 1 tablet (40 mg total) by mouth daily. 30 tablet 2   metFORMIN (GLUCOPHAGE) 500 MG tablet Take 1 tablet (500 mg total) by mouth daily with breakfast. 30 tablet 2   metoprolol succinate (TOPROL-XL) 100 MG 24 hr tablet TAKE 1 TABLET BY MOUTH ONCE DAILY(TAKE WITH OR IMMEDIATELY FOLLOWING A MEAL) 30 tablet 2   ondansetron (ZOFRAN) 4 MG tablet TAKE 1 TABLET BY MOUTH EVERY 8 HOURS AS NEEDED FOR NAUSEA AND VOMITING 20 tablet 0   SF 5000 PLUS 1.1 % CREA dental cream As directed.     SUMAtriptan (IMITREX) 50 MG tablet TAKE 1 TABLET BY MOUTH EVERY 2 HOURS AS NEEDED FOR  MIGRAINE  MAY  REPEAT  IN  2  HOURS  IF  HEADACHE  PERSISTS  OR  RECURS 9 tablet 5   TRAVATAN Z 0.004  % SOLN ophthalmic solution Place 1 drop into both eyes at bedtime.      No current facility-administered medications on file prior to visit.     BP (!) 158/87 (BP Location: Right Arm, Patient Position: Sitting, Cuff Size: Large)    Pulse (!) 58    Temp (!) 96.5 F (35.8 C) (Temporal)    Resp 16    Ht 5\' 1"  (1.549 m)    Wt 207 lb (93.9 kg)    LMP 08/09/2016    SpO2 100%    BMI 39.11 kg/m       Objective:   Physical Exam Constitutional:      Appearance: Normal appearance.  Neurological:     Mental Status: She is alert and oriented to person, place, and time.  Psychiatric:        Attention and Perception: Attention normal.        Mood and Affect: Mood and affect normal.        Speech: Speech normal.           Assessment & Plan:  Encounter for vaccination-review of patient's record indicates that she has only received 1 hepatitis A vaccine.  We will give her her second dose today.  She has titer showing immunity to hepatitis B.  Polio booster was given last year.  She will need to initiate Malarone prior during and after her trip this will be sent to her pharmacy.  She also needs the Vivotif vaccine orally prior to her departure.  It is also recommended that she receive the yellow fever vaccine.  We do not carry this vaccine and she has been advised to check with her local health department or travel clinic.  A total of 15 minutes were spent face-to-face with the patient during this encounter and over half of that time was spent on counseling and coordination of care. The patient was counseled on proper vaccinations for upcoming trip.  This visit occurred during the SARS-CoV-2 public health emergency.  Safety protocols were in place, including screening questions prior to the visit, additional usage of staff PPE, and extensive cleaning of exam room while observing appropriate contact time as indicated for disinfecting solutions.

## 2018-12-10 ENCOUNTER — Other Ambulatory Visit: Payer: Self-pay

## 2018-12-10 ENCOUNTER — Telehealth: Payer: Self-pay

## 2018-12-10 MED ORDER — YELLOW FEVER VACCINE ~~LOC~~ INJ: 0.5000 mL | INJECTION | Freq: Once | SUBCUTANEOUS | 0 refills | Status: AC

## 2018-12-10 MED ORDER — TYPHOID VACCINE PO CPDR: DELAYED_RELEASE_CAPSULE | ORAL | 0 refills | Status: DC

## 2018-12-10 NOTE — Telephone Encounter (Signed)
Please advise pt that I sent the yellow fever vaccine [facility]  If the are unable to administer, she can bring to our office for administration nurse visit. She will need to confirm storage temperature though to make sure that it is stable until she can bring it into our office.  It should be given >10 days before her trip.

## 2018-12-11 NOTE — Telephone Encounter (Signed)
Lvm for patient to call back about this information and to schedule a nurse visit appointment for 10 days prior to traveling for injection.

## 2018-12-12 ENCOUNTER — Ambulatory Visit: Payer: PRIVATE HEALTH INSURANCE | Admitting: Family

## 2018-12-12 ENCOUNTER — Other Ambulatory Visit: Payer: Self-pay

## 2018-12-12 MED ORDER — TYPHOID VACCINE PO CPDR: DELAYED_RELEASE_CAPSULE | ORAL | 0 refills | Status: DC

## 2018-12-12 NOTE — Telephone Encounter (Signed)
Patient reports [facility] was not able to get the typhoid medication either as it is on back order from manufacturer. Rx printed out for patient to pick up on Monday. A Rx for the yellow fever vaccine will also be provided.

## 2018-12-24 ENCOUNTER — Ambulatory Visit: Payer: PRIVATE HEALTH INSURANCE | Admitting: Rheumatology

## 2019-01-09 ENCOUNTER — Ambulatory Visit: Payer: PRIVATE HEALTH INSURANCE | Admitting: Family

## 2019-01-27 NOTE — Progress Notes (Signed)
Virtual Visit via Telephone Note  I connected with Amber [name] on 01/29/19 at  9:00 AM EST by telephone and verified that I am speaking with the correct person using two identifiers.  Location: Patient: Home Provider: Clinic This service was conducted via virtual visit.  The patient was located at home. I was located in my office.  Consent was obtained prior to the virtual visit and is aware of possible charges through their insurance for this visit.  The patient is an established patient.  [name], MD conducted the virtual visit and Hazel Sams, PA-C acted as scribe during the service.  Office staff helped with scheduling follow up visits after the service was conducted.   I discussed the limitations, risks, security and privacy concerns of performing an evaluation and management service by telephone and the availability of in person appointments. I also discussed with the patient that there may be a patient responsible charge related to this service. The patient expressed understanding and agreed to proceed.  CC: Discuss medications  History of Present Illness: Patient is a 59 year old female with a past medical history of seropositive rheumatoid arthritis and osteoarthritis.  She is taking plaquenil 200 mg 1 tablet by mouth twice daily M-F.  She has intermittent pain in both hands and both feet. She continues to have intermittent swelling. She takes aleve for pain relief.  She does not want to take more aggressive treatment at this time.   Review of Systems  Constitutional: Positive for malaise/fatigue. Negative for fever.  HENT: Negative for congestion.        +Mouth dryness   Eyes: Negative for photophobia, pain, discharge and redness.       +Eye dryness   Respiratory: Negative for cough, shortness of breath and wheezing.   Cardiovascular: Negative for chest pain and palpitations.  Gastrointestinal: Negative for blood in stool, constipation and diarrhea.  Genitourinary:  Negative for dysuria and frequency.  Musculoskeletal: Positive for joint pain. Negative for back pain, myalgias and neck pain.       +Joint swelling  +Morning stiffness   Skin: Negative for rash.  Neurological: Negative for dizziness, weakness and headaches.  Endo/Heme/Allergies: Does not bruise/bleed easily.  Psychiatric/Behavioral: Negative for depression and memory loss. The patient is not nervous/anxious and does not have insomnia.       Observations/Objective: Physical Exam  Constitutional: She is oriented to person, place, and time.  Neurological: She is alert and oriented to person, place, and time.  Psychiatric: Mood, memory, affect and judgment normal.   Patient reports morning stiffness for 15 minutes.   Patient reports nocturnal pain.  Difficulty dressing/grooming: Denies Difficulty climbing stairs: Reports Difficulty getting out of chair: Denies Difficulty using hands for taps, buttons, cutlery, and/or writing: Reports   Assessment and Plan: Visit Diagnoses: Rheumatoid arthritis involving multiple sites with positive rheumatoid factor (HCC) - Positive RF, negative anti-CCP, 14 3 3  eta positive, history of rheumatoid arthritis for the last 10 years. She was under care of Dr. Caryl Bis and [name]: She continues to have recurrent flares in multiple joints.  She has intermittent pain and inflammation in both hands and both feet.  She is taking plaquenil 200 mg 1 tablet by mouth twice daily M-F, which has not been effective at controlling her RA.  She takes aleve as needed for pain relief.  We discussed the need for more aggressive therapy.   Indications, contraindications, and potential side effects of MTX were discussed.  She does not want  to proceed with MTX at this time. She is apprehensive of side effects and will review information about MTX on her own.  She will notify us if she would like to proceed with MTX.  She will follow up in 3-4 months.   Sjogren's syndrome  with other organ involvement ([place]) - Anti-Ro positive, anti-La positive: She has chronic sicca symptoms.  Her symptoms have been manageable.   High risk medication use -Plaquenil 200 mg 1 tablet by mouth twice daily. PLQ eye exam: 12/09/2018 normal. [facility].  CBC drawn on 10/03/18.  BMP and hepatic function panel drawn on 09/10/18.  She is returning for lab work in March to monitor for drug toxicity.   Primary osteoarthritis of both knees - She has intermittent pain and inflammation in both knee joints.  She has ongoing difficulty climbing steps.  Primary osteoarthritis of both feet: She has intermittent pain and inflammation in both feet.   Other medical conditions are listed as follows:   Family history of rheumatoid arthritis - In her sister  Coronary artery disease involving native coronary artery of native heart without angina pectoris  Chronic diastolic CHF (congestive heart failure) ([place])  Essential hypertension  Controlled type 2 diabetes mellitus without complication, without long-term current use of insulin (HCC)  Hx of migraines  Other fatigue  Palpitations   Follow Up Instructions: She will follow up in 3-4 months   I discussed the assessment and treatment plan with the patient. The patient was provided an opportunity to ask questions and all were answered. The patient agreed with the plan and demonstrated an understanding of the instructions.   The patient was advised to call back or seek an in-person evaluation if the symptoms worsen or if the condition fails to improve as anticipated.  I provided 15 minutes of non-face-to-face time during this encounter.   Bo Merino, MD   Scribed by-  Hazel Sams, PA-C

## 2019-01-29 ENCOUNTER — Telehealth: Payer: Self-pay | Admitting: Rheumatology

## 2019-01-29 ENCOUNTER — Encounter: Payer: Self-pay | Admitting: Rheumatology

## 2019-01-29 ENCOUNTER — Telehealth (INDEPENDENT_AMBULATORY_CARE_PROVIDER_SITE_OTHER): Payer: PRIVATE HEALTH INSURANCE | Admitting: Rheumatology

## 2019-01-29 ENCOUNTER — Other Ambulatory Visit: Payer: Self-pay

## 2019-01-29 DIAGNOSIS — R5383 Other fatigue: Secondary | ICD-10-CM

## 2019-01-29 DIAGNOSIS — M19071 Primary osteoarthritis, right ankle and foot: Secondary | ICD-10-CM

## 2019-01-29 DIAGNOSIS — M0579 Rheumatoid arthritis with rheumatoid factor of multiple sites without organ or systems involvement: Secondary | ICD-10-CM | POA: Diagnosis not present

## 2019-01-29 DIAGNOSIS — I2510 Atherosclerotic heart disease of native coronary artery without angina pectoris: Secondary | ICD-10-CM

## 2019-01-29 DIAGNOSIS — I10 Essential (primary) hypertension: Secondary | ICD-10-CM

## 2019-01-29 DIAGNOSIS — M170 Bilateral primary osteoarthritis of knee: Secondary | ICD-10-CM

## 2019-01-29 DIAGNOSIS — Z79899 Other long term (current) drug therapy: Secondary | ICD-10-CM

## 2019-01-29 DIAGNOSIS — E119 Type 2 diabetes mellitus without complications: Secondary | ICD-10-CM

## 2019-01-29 DIAGNOSIS — Z8261 Family history of arthritis: Secondary | ICD-10-CM

## 2019-01-29 DIAGNOSIS — M19072 Primary osteoarthritis, left ankle and foot: Secondary | ICD-10-CM

## 2019-01-29 DIAGNOSIS — M3509 Sicca syndrome with other organ involvement: Secondary | ICD-10-CM

## 2019-01-29 DIAGNOSIS — I5032 Chronic diastolic (congestive) heart failure: Secondary | ICD-10-CM

## 2019-01-29 DIAGNOSIS — Z8669 Personal history of other diseases of the nervous system and sense organs: Secondary | ICD-10-CM

## 2019-01-29 NOTE — Telephone Encounter (Signed)
I LMOM for patient to call, and schedule a follow up appointment for 4 months (around 05/29/19).

## 2019-01-29 NOTE — Telephone Encounter (Signed)
-----   Message from Shona Needles, RT sent at 01/29/2019 11:41 AM EST ----- Regarding: 3-4 MONTH F/U

## 2019-02-04 ENCOUNTER — Ambulatory Visit: Payer: PRIVATE HEALTH INSURANCE | Admitting: Rheumatology

## 2019-02-11 ENCOUNTER — Other Ambulatory Visit: Payer: Self-pay | Admitting: Family

## 2019-02-11 ENCOUNTER — Other Ambulatory Visit: Payer: Self-pay | Admitting: Cardiology

## 2019-02-11 ENCOUNTER — Other Ambulatory Visit: Payer: Self-pay | Admitting: Rheumatology

## 2019-02-11 DIAGNOSIS — M0579 Rheumatoid arthritis with rheumatoid factor of multiple sites without organ or systems involvement: Secondary | ICD-10-CM

## 2019-02-11 DIAGNOSIS — R002 Palpitations: Secondary | ICD-10-CM

## 2019-02-11 DIAGNOSIS — M3509 Sicca syndrome with other organ involvement: Secondary | ICD-10-CM

## 2019-02-11 DIAGNOSIS — I10 Essential (primary) hypertension: Secondary | ICD-10-CM

## 2019-02-11 DIAGNOSIS — M5410 Radiculopathy, site unspecified: Secondary | ICD-10-CM

## 2019-02-11 NOTE — Telephone Encounter (Signed)
Last Visit: 01/29/19 Next Visit: due May 2021.  Labs: 10/03/18 WBC 3.8, Neutro Abc 1.0 Eye exam: 12/09/2018 normal.   Okay to refill per [name]

## 2019-02-23 ENCOUNTER — Other Ambulatory Visit: Payer: Self-pay | Admitting: Family

## 2019-02-23 DIAGNOSIS — I10 Essential (primary) hypertension: Secondary | ICD-10-CM

## 2019-03-06 IMAGING — MG DIGITAL SCREENING BILATERAL MAMMOGRAM WITH TOMO AND CAD
8 series · 8 of 24 positions shown · non-contrast
Comparison: Previous exam(s).

CLINICAL DATA: Screening.

EXAM:
DIGITAL SCREENING BILATERAL MAMMOGRAM WITH TOMO AND CAD

[R CC synth-2D]
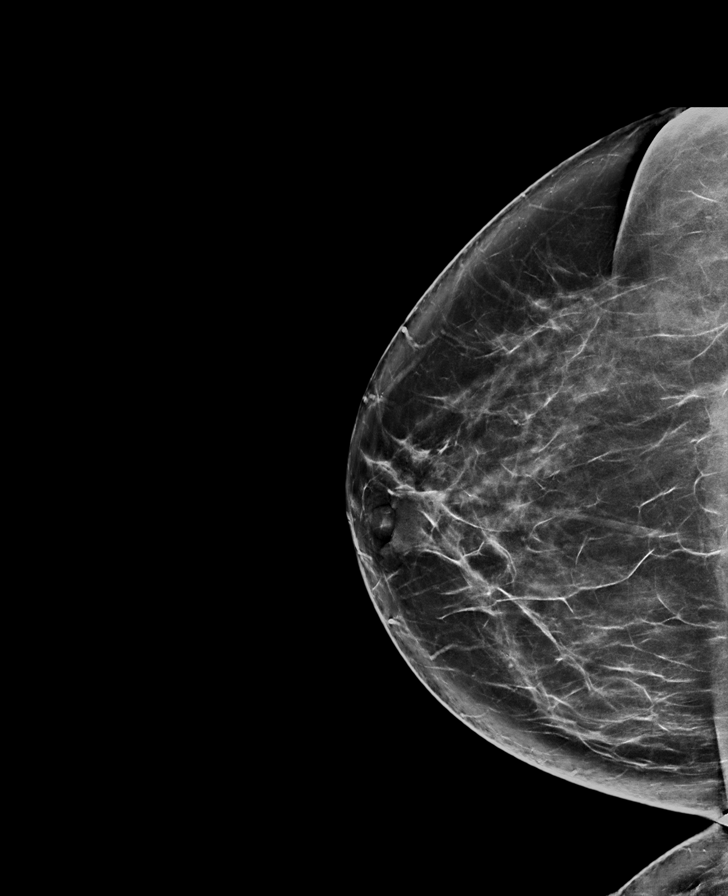

[L CC synth-2D]
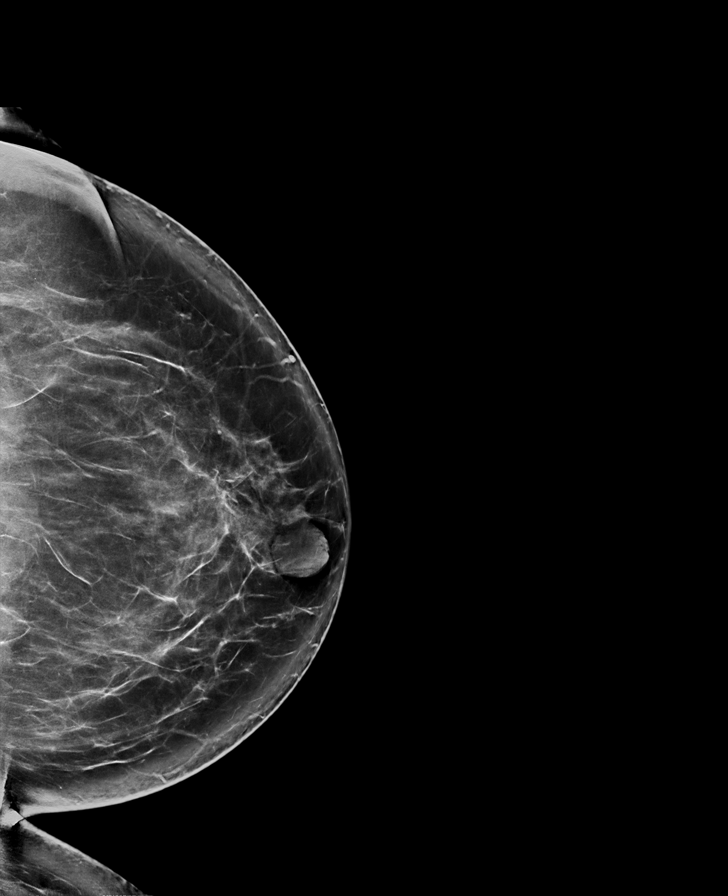

[R MLO synth-2D]
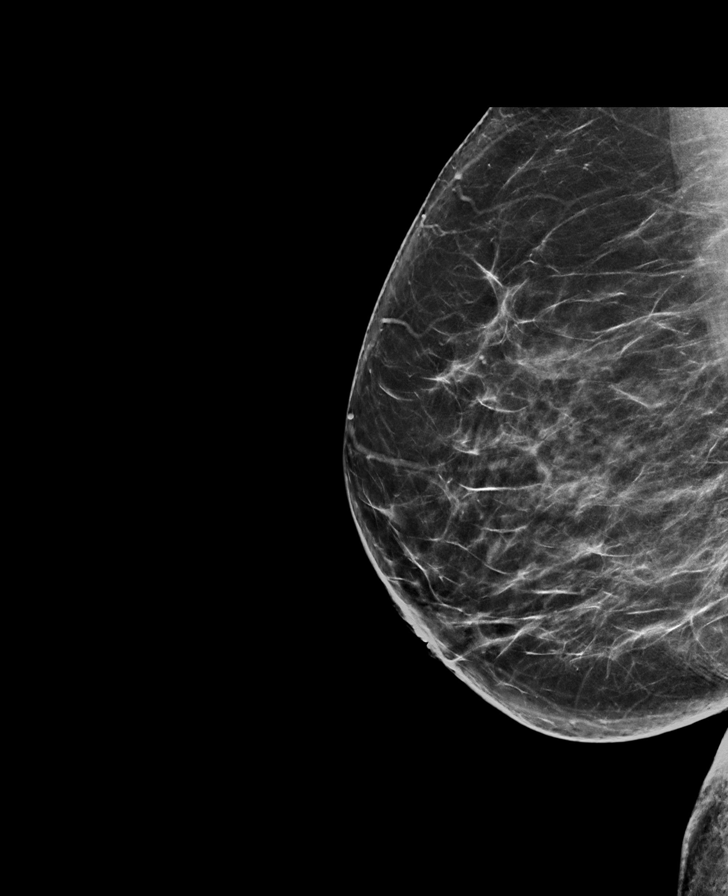

[L MLO synth-2D]
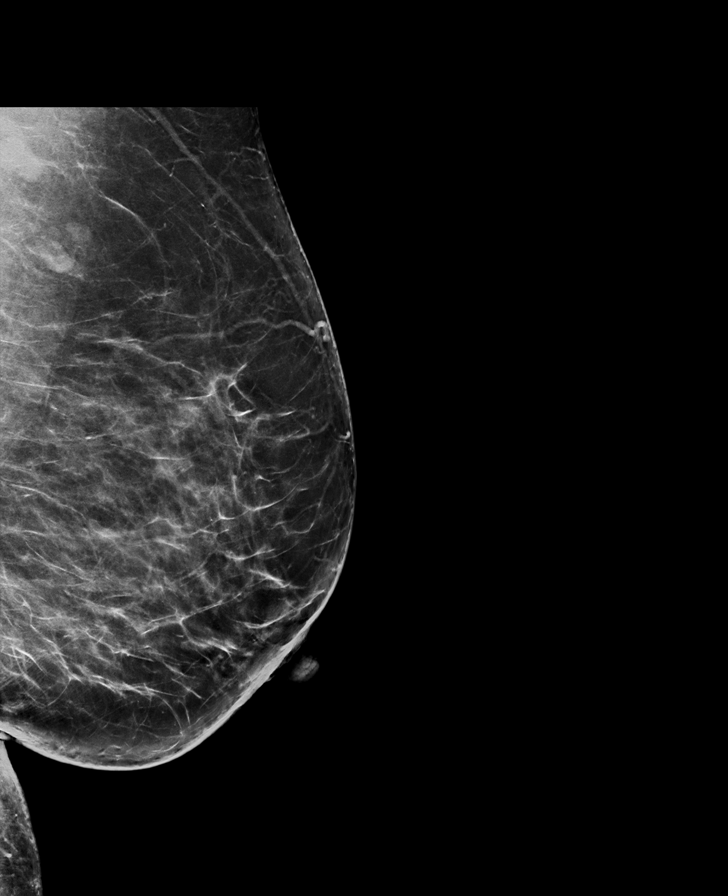

[L MLO tomo · tomo slice 41/80.0]
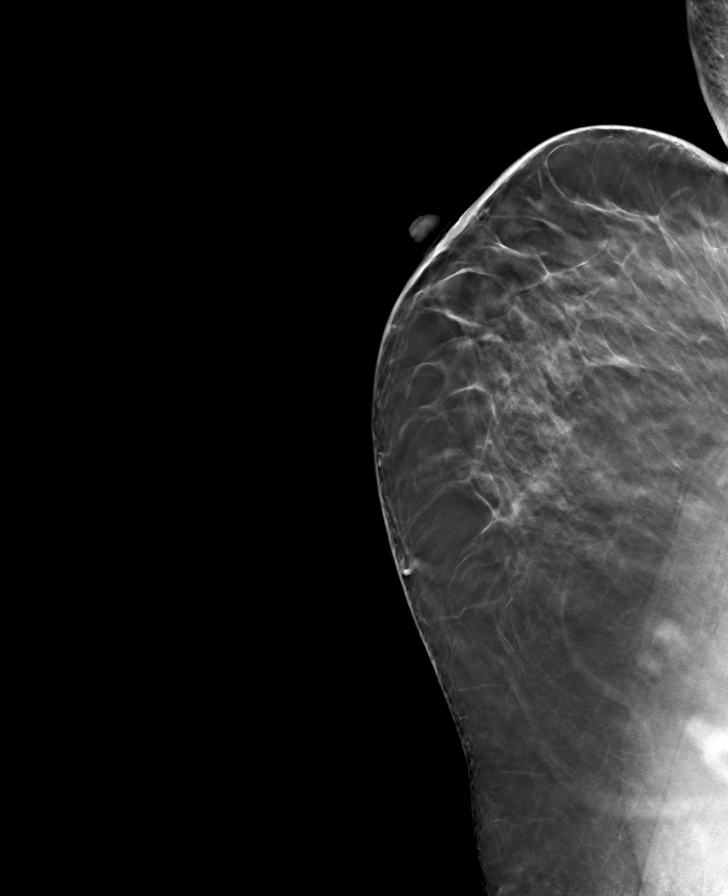

[R CC tomo · tomo slice 43/86.0]
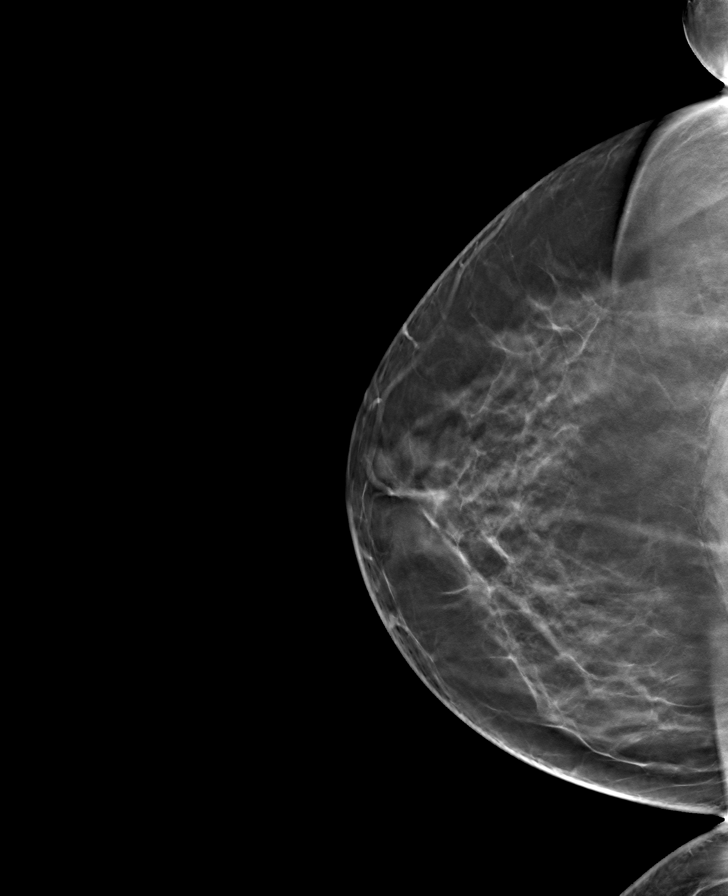

[R MLO tomo · tomo slice 38/75.0]
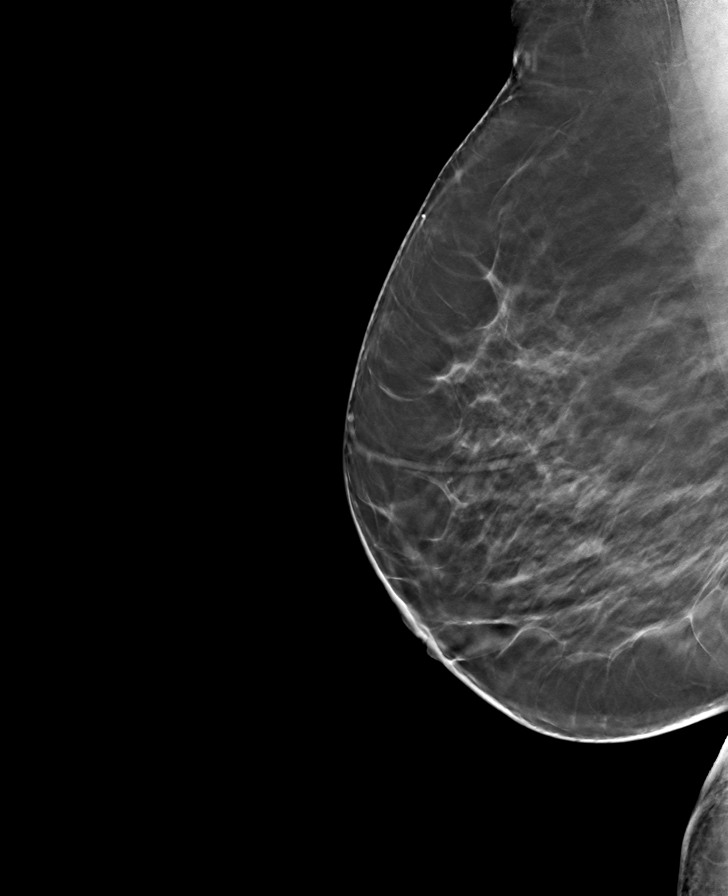

[L CC tomo · tomo slice 45/89.0]
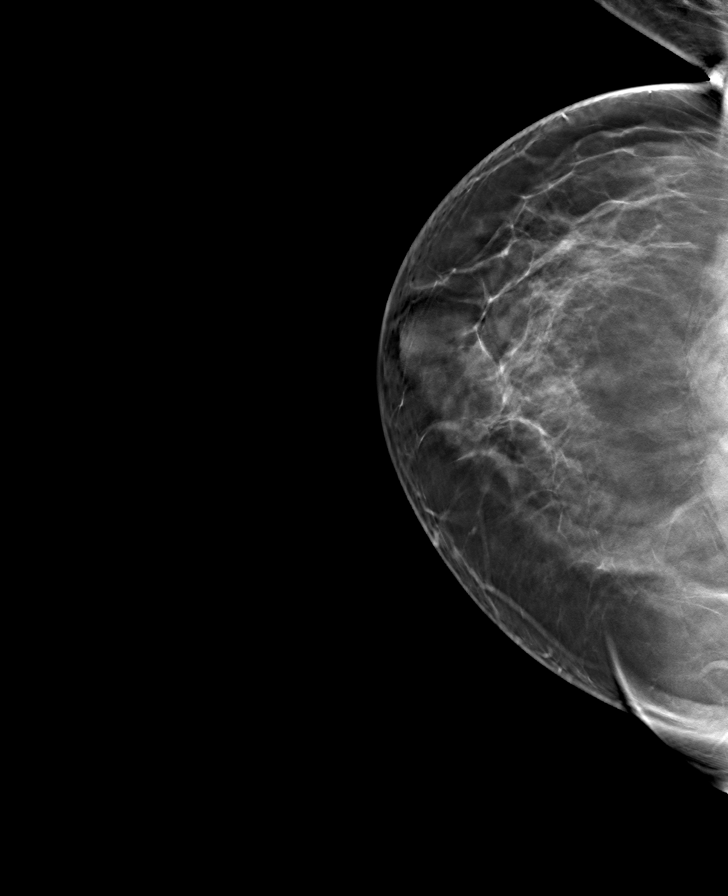

[8 of 24 positions shown; findings below may reference images not displayed]

ACR Breast Density Category b: There are scattered areas of
fibroglandular density.
FINDINGS: There are no findings suspicious for malignancy. Images were
processed with CAD.
IMPRESSION: No mammographic evidence of malignancy. A result letter of this
screening mammogram will be mailed directly to the patient.

RECOMMENDATION:
Screening mammogram in one year. (Code:[id number])

BI-RADS CATEGORY  1: Negative.

## 2019-03-24 ENCOUNTER — Other Ambulatory Visit: Payer: Self-pay

## 2019-03-24 DIAGNOSIS — Z79899 Other long term (current) drug therapy: Secondary | ICD-10-CM

## 2019-03-24 LAB — CBC WITH DIFFERENTIAL/PLATELET
Absolute Monocytes: 241 cells/uL (ref 200–950)
Basophils Absolute: 20 cells/uL (ref 0–200)
Basophils Relative: 0.6 %
Eosinophils Absolute: 132 cells/uL (ref 15–500)
Eosinophils Relative: 4 %
HCT: 40.5 % (ref 35.0–45.0)
Hemoglobin: 12.9 g/dL (ref 11.7–15.5)
Lymphs Abs: 2188 cells/uL (ref 850–3900)
MCH: 27.4 pg (ref 27.0–33.0)
MCHC: 31.9 g/dL — ABNORMAL LOW (ref 32.0–36.0)
MCV: 86.2 fL (ref 80.0–100.0)
MPV: 11.1 fL (ref 7.5–12.5)
Monocytes Relative: 7.3 %
Neutro Abs: 719 cells/uL — ABNORMAL LOW (ref 1500–7800)
Neutrophils Relative %: 21.8 %
Platelets: 216 10*3/uL (ref 140–400)
RBC: 4.7 10*6/uL (ref 3.80–5.10)
RDW: 12.6 % (ref 11.0–15.0)
Total Lymphocyte: 66.3 %
WBC: 3.3 10*3/uL — ABNORMAL LOW (ref 3.8–10.8)

## 2019-03-24 LAB — COMPLETE METABOLIC PANEL WITH GFR
AG Ratio: 1 (calc) (ref 1.0–2.5)
ALT: 16 U/L (ref 6–29)
AST: 18 U/L (ref 10–35)
Albumin: 4.2 g/dL (ref 3.6–5.1)
Alkaline phosphatase (APISO): 49 U/L (ref 37–153)
BUN: 8 mg/dL (ref 7–25)
CO2: 27 mmol/L (ref 20–32)
Calcium: 9.7 mg/dL (ref 8.6–10.4)
Chloride: 106 mmol/L (ref 98–110)
Creat: 0.68 mg/dL (ref 0.50–1.05)
GFR, Est African American: 113 mL/min/{1.73_m2} (ref >=60)
GFR, Est Non African American: 97 mL/min/{1.73_m2} (ref >=60)
Globulin: 4.2 g/dL (calc) — ABNORMAL HIGH (ref 1.9–3.7)
Glucose, Bld: 88 mg/dL (ref 65–99)
Potassium: 4 mmol/L (ref 3.5–5.3)
Sodium: 141 mmol/L (ref 135–146)
Total Bilirubin: 0.7 mg/dL (ref 0.2–1.2)
Total Protein: 8.4 g/dL — ABNORMAL HIGH (ref 6.1–8.1)

## 2019-03-25 NOTE — Progress Notes (Signed)
WBC is low due to Plaquenil use.  Total protein is low due to chronic disease.  Patient should get repeat labs every 3 months.

## 2019-03-30 NOTE — Progress Notes (Signed)
Office Visit Note  Patient: Amber [name]             Date of Birth: August 27, 1960           MRN: [id number]             PCP: [name], NP Referring: [name], NP Visit Date: 03/31/2019 Occupation: @GUAROCC @  Subjective:  Pain in multiple joints   History of Present Illness: ARIYONNA [name] is a 59 y.o. female with history of seropositive rheumatoid arthritis, Sjogren's syndrome, and osteoarthritis.  Patient was started on Plaquenil 200 mg 1 tablet twice daily Monday through Friday in November 2020.  She has been tolerating Plaquenil without any side effects.  She states that she has only noticed about a 50% improvement in her joint pain and inflammation.  She continues to have persistent pain and inflammation in both wrist joints, both hands, both knee joints, both ankle joints.  She has also having increased discomfort and stiffness in both shoulder joints.  She has warmth in both knee joints.  She has been taking Aleve in the evenings when getting home from work due to the discomfort she has been experiencing.  She continues to have chronic sicca symptoms and does not notice any improvement since starting on Plaquenil.   Activities of Daily Living:  Patient reports morning stiffness for 15-20 minutes.   Patient Reports nocturnal pain.  Difficulty dressing/grooming: Reports Difficulty climbing stairs: Reports Difficulty getting out of chair: Reports Difficulty using hands for taps, buttons, cutlery, and/or writing: Reports  Review of Systems  Constitutional: Positive for fatigue.  HENT: Positive for mouth dryness and nose dryness. Negative for mouth sores.   Eyes: Positive for dryness. Negative for pain and visual disturbance.  Respiratory: Negative for cough, hemoptysis, shortness of breath and difficulty breathing.   Cardiovascular: Negative for chest pain, palpitations, hypertension and swelling in legs/feet.  Gastrointestinal: Negative for blood in  stool, constipation and diarrhea.  Endocrine: Negative for increased urination.  Genitourinary: Negative for difficulty urinating and painful urination.  Musculoskeletal: Positive for arthralgias, joint pain and morning stiffness. Negative for joint swelling, myalgias, muscle weakness, muscle tenderness and myalgias.  Skin: Negative for color change, pallor, rash, hair loss, nodules/bumps, redness, skin tightness, ulcers and sensitivity to sunlight.  Allergic/Immunologic: Negative for susceptible to infections.  Neurological: Positive for headaches. Negative for dizziness, numbness, memory loss and weakness.  Hematological: Negative for bruising/bleeding tendency and swollen glands.  Psychiatric/Behavioral: Negative for depressed mood, confusion and sleep disturbance. The patient is not nervous/anxious.     PMFS History:  Patient Active Problem List   Diagnosis Date Noted   Other fatigue 09/04/2017   Shortness of breath on exertion 09/04/2017   Essential hypertension 09/04/2017   Controlled type 2 diabetes mellitus without complication, without long-term current use of insulin ([place]) 05/31/2017   Chronic diastolic CHF (congestive heart failure) ([place]) 04/24/2017   CAD (coronary artery disease) 04/24/2017   Screening-pulmonary TB 06/14/2016   History of BCG vaccination 06/14/2016   Fibroids 07/27/2014   Migraine 04/19/2014   Nail fungus 04/09/2014   Elevated serum protein level 04/09/2014   Preventative health care 04/09/2014   Iron deficiency anemia 09/02/2013   Headache(784.0) 03/04/2013   Obesity (BMI 30-39.9) 10/17/2012   Palpitations 10/01/2012   Glaucoma 11/07/2011   Rheumatoid arthritis ([place]) 11/07/2011   Borderline diabetes 11/07/2011   HTN (hypertension) 11/05/2011   Sjogren's disease ([place]) 11/05/2011   Anemia 09/22/2010   Fibroid uterus 09/22/2010   Neutropenia 09/22/2010  Dysmenorrhea 09/15/2010    Past Medical History:  Diagnosis Date    Anemia    Arthritis    Blood transfusion without reported diagnosis    Colon polyps    Controlled type 2 diabetes mellitus without complication, without long-term current use of insulin ([place]) 05/31/2017   Ectopic pregnancy    RIGHT salpingostomy   Elective abortion    GERD (gastroesophageal reflux disease)    Glaucoma    both eyes   Headache    migraines   History of chicken pox    Hyperlipemia    Hypertension    Rheumatoid arthritis(714.0) 11/07/2011   SAB (spontaneous abortion)    Shortness of breath    Sjogren's disease ([place])    Wheezing     Family History  Problem Relation Age of Onset   Hypertension Mother        died from heart disease   Heart disease Mother        deceased, unknown cause   Stroke Mother    Obesity Mother    Diabetes Sister    Hypertension Sister    Esophageal cancer Sister    Stomach cancer Sister    Cancer Sister    Diabetes Sister    Rectal cancer Neg Hx    Colon cancer Neg Hx    Past Surgical History:  Procedure Laterality Date   ABDOMINAL SURGERY     bowel repair   COLONOSCOPY     DILATION AND CURETTAGE OF UTERUS     X 2   MENISCUS REPAIR Right    MENISCUS TEAR REPAIR   MYOMECTOMY N/A 07/27/2014   Procedure: ABDOMINAL MYOMECTOMY;  Surgeon: Waymon Amato, MD;  Location: [facility];  Service: Gynecology;  Laterality: N/A;   PELVIC LAPAROSCOPY  1994   IN 1999 LAPAROSCOPY WITH INCIDENTAL ENTEROTOMY REQUIRING LAPAROTOMY   UTERINE FIBROID SURGERY     July 2016   Social History   Social History Narrative   Regular exercise:  No   Caffeine Use: 2 cups coffee daily   No children   "Happily divorced"   Reports that she is involved at her church   Works as an Therapist, 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  Had a sister up in [place] who passed away Apr 15, 2015 who she was close to.   Born in [place]- she came to [place] as adult.  Age 38.          Immunization History  Administered Date(s) Administered   Hepatitis A, Adult  11/01/2017, 12/03/2018   IPV 11/01/2017   Influenza Split 11/07/2011   Influenza,inj,Quad PF,6+ Mos 10/17/2012, 01/15/2014, 08/31/2014, 09/30/2017, 09/10/2018   Meningococcal Mcv4o 11/01/2017   Moderna SARS-COVID-2 Vaccination 01/28/2019   Pneumococcal Polysaccharide-23 09/10/2018   Tdap 06/01/2011, 11/25/2012   Typhoid Live 11/01/2017   Zoster Recombinat (Shingrix) 10/04/2017, 12/04/2017     Objective: Vital Signs: BP (!) 179/103 (BP Location: Left Arm, Patient Position: Sitting, Cuff Size: Normal)    Pulse 90    Resp 14    Ht 5\' 1"  (1.549 m)    Wt 211 lb 9.6 oz (96 kg)    LMP 08/09/2016    BMI 39.98 kg/m    Physical Exam Vitals and nursing note reviewed.  Constitutional:      Appearance: She is well-developed.  HENT:     Head: Normocephalic and atraumatic.  Eyes:     Conjunctiva/sclera: Conjunctivae normal.  Pulmonary:     Effort: Pulmonary effort is normal.  Abdominal:     General: Bowel sounds  are normal.     Palpations: Abdomen is soft.  Musculoskeletal:     Cervical back: Normal range of motion.  Lymphadenopathy:     Cervical: No cervical adenopathy.  Skin:    General: Skin is warm and dry.     Capillary Refill: Capillary refill takes less than 2 seconds.  Neurological:     Mental Status: She is alert and oriented to person, place, and time.  Psychiatric:        Behavior: Behavior normal.      Musculoskeletal Exam: C-spine, thoracic spine, lumbar spine good range of motion.  Shoulder joints have good range of motion with discomfort bilaterally.  She has tenderness of both shoulder joints on exam.  Elbow joints have good range of motion with no tenderness or inflammation.  She has tenderness of both wrist joints.  Right first through fifth MCP joint tenderness.  Tenderness and synovitis of the left second and third MCP joints.  Hip joints have good range of motion with no discomfort.  Knee joints have good range of motion with warmth bilaterally.  Ankle joints  are tender.  Pedal edema noted bilaterally.  She is tenderness of bilateral first MTP joints.  CDAI Exam: CDAI Score: 21.4  Patient Global: 8 mm; Provider Global: 6 mm Swollen: 7 ; Tender: 17  Joint Exam 03/31/2019      Right  Left  Glenohumeral   Tender   Tender  Wrist   Tender   Tender  MCP 1  Swollen Tender     MCP 2  Swollen Tender  Swollen Tender  MCP 3  Swollen Tender  Swollen Tender  MCP 4   Tender     MCP 5   Tender     Knee  Swollen Tender  Swollen Tender  Ankle   Tender   Tender  MTP 1   Tender   Tender     Investigation: No additional findings.  Imaging: No results found.  Recent Labs: Lab Results  Component Value Date   WBC 3.3 (L) 03/24/2019   HGB 12.9 03/24/2019   PLT 216 03/24/2019   NA 141 03/24/2019   K 4.0 03/24/2019   CL 106 03/24/2019   CO2 27 03/24/2019   GLUCOSE 88 03/24/2019   BUN 8 03/24/2019   CREATININE 0.68 03/24/2019   BILITOT 0.7 03/24/2019   ALKPHOS 62 09/10/2018   AST 18 03/24/2019   ALT 16 03/24/2019   PROT 8.4 (H) 03/24/2019   ALBUMIN 4.0 09/10/2018   CALCIUM 9.7 03/24/2019   GFRAA 113 03/24/2019   QFTBGOLD Negative 06/14/2016   QFTBGOLDPLUS Negative 10/27/2018    Speciality Comments: PLQ eye exam: 12/09/2018 normal. [facility].  Procedures:  No procedures performed Allergies: Patient has no known allergies.   Assessment / Plan:     Visit Diagnoses: Rheumatoid arthritis involving multiple sites with positive rheumatoid factor (HCC) - Positive RF, negative anti-CCP, 14 3 3  eta positive, history of rheumatoid arthritis for the last 10 years.  She was under care of Dr. Graylon Gunning and [name] in the past: She has persistent pain and inflammation in multiple joints including both wrist joints, both hands, both knee joints, both ankle joints.  Warmth in both knee joints were noted on exam today.  She has tenderness in multiple joints as described above.  She was restarted on Plaquenil 200 mg 1 tablet by mouth twice daily  Monday through Friday in November 2020.  She had a gap in therapy for 1 year  when transferring from [facility] rheumatology.  She has been tolerating Plaquenil without any side effects.  She has only noticed a 45 to 50% improvement since restarting on Plaquenil.  She has been taking Aleve in the evenings due to the pain and inflammation she has been experiencing.  We discussed that she would benefit from more aggressive therapy but due to her history of neutropenia and chronic diastolic heart failure her treatment options are limited.  We discussed discontinuing Plaquenil and starting on methotrexate.  Indications, contraindications, potential side effects of methotrexate were discussed.  All questions were addressed and consent was obtained today.  She will start on methotrexate 0.4 mL once weekly and if labs are stable in 2 weeks she will increase to 0.6 mL once weekly.  She will take folic acid 1 mg po daily. We will keep her on low-dose methotrexate due to her history of neutropenia and will follow lab work closely.  She will return for lab work in 2 weeks x 2 then 2 months and every 3 months.  We will also send in a prednisone taper starting at 20 mg tapering by 5 mg every 4 days.  We discussed that prednisone may raise her white blood cell count temporarily.  She will follow up in 6 weeks.   Drug Counseling TB Gold: TB gold negative 10/27/2018 Hepatitis panel: Hepatitis B and C- on 10/27/2018.  Chest-xray: Checks x-ray did not reveal reveal any active cardiopulmonary disease on 11/01/2017.  Contraception: She is postmenopausal.   Alcohol use: Discussed the importance of avoiding use of alcohol.   Patient was counseled on the purpose, proper use, and adverse effects of methotrexate including nausea, infection, and signs and symptoms of pneumonitis.  Reviewed instructions with patient to take methotrexate weekly along with folic acid daily.  Discussed the importance of frequent monitoring of kidney  and liver function and blood counts, and provided patient with standing lab instructions.  Counseled patient to avoid NSAIDs and alcohol while on methotrexate.  Provided patient with educational materials on methotrexate and answered all questions.  Advised patient to get annual influenza vaccine and to get a pneumococcal vaccine if patient has not already had one.  Patient voiced understanding.  Patient consented to methotrexate use.  Will upload into chart.    High risk medication use -she will be started on methotrexate 0.4 mL once weekly and if labs are stable she will increase to 0.6 mL once weekly.  She will take folic acid 1 mg by mouth daily.  She will discontinue Plaquenil use at this time.  PLQ [facility]: 12/09/2018 [facility].  CBC and CMP were drawn on 03/24/2019.  White blood cell count was 3.3 at that time.  She has a history of neutropenia.  She will return for lab work in 2 weeks x 2 then 2 months and every 3 months  Sjogren's syndrome with other organ involvement ([place]) - Anti-Ro positive, anti-La positive: She has chronic sicca symptoms, which have not improved since starting on PLQ.  She uses eyedrops eyedrops for symptomatic relief.  Primary osteoarthritis of both knees: She has good ROM of both knee joints with discomfort. Warmth of both knee joints noted.   Primary osteoarthritis of both feet: She has tenderness of bilateral 1st MTP joints.  She has been having discomfort in both ankle joints.  Tenderness of both ankle joints noted.  Pedal edema bilaterally.  Other medical conditions are listed as follows:   Family history of rheumatoid arthritis -  Sister  Coronary artery disease involving native coronary artery of native heart without angina pectoris  Chronic diastolic CHF (congestive heart failure) ([place])  Essential hypertension  Controlled type 2 diabetes mellitus without complication, without long-term current use of insulin (HCC)  Hx of migraines  Other  fatigue  Palpitations  Orders: Orders Placed This Encounter  Procedures   CBC with Differential/Platelet   COMPLETE METABOLIC PANEL WITH GFR   Meds ordered this encounter  Medications   predniSONE (DELTASONE) 5 MG tablet    Sig: Take 4 tablets by mouth daily x4 days, 3 tablets by mouth daily x4 days, 2 tablets by mouth daily x4 days, 1 tablet by mouth daily x4 days.    Dispense:  40 tablet    Refill:  0   folic acid (FOLVITE) 1 MG tablet    Sig: Take 1 tablet (1 mg total) by mouth daily.    Dispense:  90 tablet    Refill:  1   methotrexate 50 MG/2ML injection    Sig: Inject 0.4 mL into the skin once weekly for 2 weeks, if labs are stable increase to 0.6 mL once weekly.    Dispense:  2 mL    Refill:  0   Tuberculin-Allergy Syringes 27G X 1/2" 1 ML MISC    Sig: Use to inject methotrexate weekly.    Dispense:  12 each    Refill:  1    Face-to-face time spent with patient was 30 minutes. Greater than 50% of time was spent in counseling and coordination of care.  Follow-Up Instructions: Return in about 6 weeks (around 05/12/2019) for Rheumatoid arthritis, Osteoarthritis, Sjogren's syndrome.   Hazel Sams, PA-C  I examined and evaluated the patient with Hazel Sams PA.  Patient had ongoing synovitis on examination today.  She was given a prednisone taper.  After detailed counseling was provided she was started on methotrexate.  We will see response to the therapy.  As she has neutropenia we advised her to discontinue Plaquenil.  We will monitor labs closely.  The plan of care was discussed as noted above.  Bo Merino, MD  Note - This record has been created using Editor, commissioning.  Chart creation errors have been sought, but may not always  have been located. Such creation errors do not reflect on  the standard of medical care.

## 2019-03-31 ENCOUNTER — Encounter: Payer: Self-pay | Admitting: Rheumatology

## 2019-03-31 ENCOUNTER — Ambulatory Visit: Payer: 59 | Admitting: Rheumatology

## 2019-03-31 ENCOUNTER — Other Ambulatory Visit: Payer: Self-pay

## 2019-03-31 VITALS — BP 179/103 | HR 90 | Resp 14 | Ht 61.0 in | Wt 211.6 lb

## 2019-03-31 DIAGNOSIS — Z8261 Family history of arthritis: Secondary | ICD-10-CM

## 2019-03-31 DIAGNOSIS — R002 Palpitations: Secondary | ICD-10-CM

## 2019-03-31 DIAGNOSIS — M0579 Rheumatoid arthritis with rheumatoid factor of multiple sites without organ or systems involvement: Secondary | ICD-10-CM | POA: Diagnosis not present

## 2019-03-31 DIAGNOSIS — M3509 Sicca syndrome with other organ involvement: Secondary | ICD-10-CM

## 2019-03-31 DIAGNOSIS — Z79899 Other long term (current) drug therapy: Secondary | ICD-10-CM

## 2019-03-31 DIAGNOSIS — I10 Essential (primary) hypertension: Secondary | ICD-10-CM

## 2019-03-31 DIAGNOSIS — M170 Bilateral primary osteoarthritis of knee: Secondary | ICD-10-CM

## 2019-03-31 DIAGNOSIS — I5032 Chronic diastolic (congestive) heart failure: Secondary | ICD-10-CM

## 2019-03-31 DIAGNOSIS — Z8669 Personal history of other diseases of the nervous system and sense organs: Secondary | ICD-10-CM

## 2019-03-31 DIAGNOSIS — I2510 Atherosclerotic heart disease of native coronary artery without angina pectoris: Secondary | ICD-10-CM

## 2019-03-31 DIAGNOSIS — M19071 Primary osteoarthritis, right ankle and foot: Secondary | ICD-10-CM

## 2019-03-31 DIAGNOSIS — R5383 Other fatigue: Secondary | ICD-10-CM

## 2019-03-31 DIAGNOSIS — M19072 Primary osteoarthritis, left ankle and foot: Secondary | ICD-10-CM

## 2019-03-31 DIAGNOSIS — E119 Type 2 diabetes mellitus without complications: Secondary | ICD-10-CM

## 2019-03-31 MED ORDER — METHOTREXATE SODIUM CHEMO INJECTION 50 MG/2ML: INTRAMUSCULAR | 0 refills | Status: DC

## 2019-03-31 MED ORDER — "TUBERCULIN-ALLERGY SYRINGES 27G X 1/2"" 1 ML MISC": 1 refills | Status: DC

## 2019-03-31 MED ORDER — PREDNISONE 5 MG PO TABS: ORAL_TABLET | ORAL | 0 refills | Status: DC

## 2019-03-31 MED ORDER — FOLIC ACID 1 MG PO TABS: 1.0000 mg | ORAL_TABLET | Freq: Every day | ORAL | 1 refills | Status: DC

## 2019-03-31 NOTE — Patient Instructions (Signed)
Standing Labs We placed an order today for your standing lab work.    Please come back and get your standing labs in 2 weeks x2, 2 months and then every 3 months.   We have open lab daily Monday through Thursday from 8:30-12:30 PM and 1:30-4:30 PM and Friday from 8:30-12:30 PM and 1:30-4:00 PM at the office of Dr. Bo Merino.   You may experience shorter wait times on Monday and Friday afternoons. The office is located at [address] [place], [place], [place] [address] No appointment is necessary.   Labs are drawn by [facility].  You may receive a bill from [facility] for your lab work.  If you wish to have your labs drawn at another location, please call the office 24 hours in advance to send orders.  If you have any questions regarding directions or hours of operation,  please call [phone].   Just as a reminder please drink plenty of water prior to coming for your lab work. Thanks!    Methotrexate injection What is this medicine? METHOTREXATE (METH oh TREX ate) is a chemotherapy drug used to treat cancer including breast cancer, leukemia, and lymphoma. This medicine can also be used to treat psoriasis and certain kinds of arthritis. This medicine may be used for other purposes; ask your health care provider or pharmacist if you have questions. What should I tell my health care provider before I take this medicine? They need to know if you have any of these conditions:  fluid in the stomach area or lungs  if you often drink alcohol  infection or immune system problems  kidney disease  liver disease  low blood counts, like low white cell, platelet, or red cell counts  lung disease  radiation therapy  stomach ulcers  ulcerative colitis  an unusual or allergic reaction to methotrexate, other medicines, foods, dyes, or preservatives  pregnant or trying to get pregnant  breast-feeding How should I use this medicine? This medicine is for infusion into a vein  or for injection into muscle or into the spinal fluid (whichever applies). It is usually given by a health care professional in a hospital or clinic setting. In rare cases, you might get this medicine at home. You will be taught how to give this medicine. Use exactly as directed. Take your medicine at regular intervals. Do not take your medicine more often than directed. If this medicine is used for arthritis or psoriasis, it should be taken weekly, NOT daily. It is important that you put your used needles and syringes in a special sharps container. Do not put them in a trash can. If you do not have a sharps container, call your pharmacist or healthcare provider to get one. Talk to your pediatrician regarding the use of this medicine in children. While this drug may be prescribed for children as young as 2 years for selected conditions, precautions do apply. Overdosage: If you think you have taken too much of this medicine contact a poison control center or emergency room at once. NOTE: This medicine is only for you. Do not share this medicine with others. What if I miss a dose? It is important not to miss your dose. Call your doctor or health care professional if you are unable to keep an appointment. If you give yourself the medicine and you miss a dose, talk with your doctor or health care professional. Do not take double or extra doses. What may interact with this medicine? This medicine may interact  with the following medications:  acitretin  aspirin or aspirin-like medicines including salicylates  azathioprine  certain antibiotics like chloramphenicol, penicillin, tetracycline  certain medicines for stomach problems like esomeprazole, omeprazole, pantoprazole  cyclosporine  gold  hydroxychloroquine  live virus vaccines  mercaptopurine  NSAIDs, medicines for pain and inflammation, like ibuprofen or naproxen  other cytotoxic  agents  penicillamine  phenylbutazone  phenytoin  probenacid  retinoids such as isotretinoin and tretinoin  steroid medicines like prednisone or cortisone  sulfonamides like sulfasalazine and trimethoprim/sulfamethoxazole  theophylline This list may not describe all possible interactions. Give your health care provider a list of all the medicines, herbs, non-prescription drugs, or dietary supplements you use. Also tell them if you smoke, drink alcohol, or use illegal drugs. Some items may interact with your medicine. What should I watch for while using this medicine? Avoid alcoholic drinks. In some cases, you may be given additional medicines to help with side effects. Follow all directions for their use. This medicine can make you more sensitive to the sun. Keep out of the sun. If you cannot avoid being in the sun, wear protective clothing and use sunscreen. Do not use sun lamps or tanning beds/booths. You may get drowsy or dizzy. Do not drive, use machinery, or do anything that needs mental alertness until you know how this medicine affects you. Do not stand or sit up quickly, especially if you are an older patient. This reduces the risk of dizzy or fainting spells. You may need blood work done while you are taking this medicine. Call your doctor or health care professional for advice if you get a fever, chills or sore throat, or other symptoms of a cold or flu. Do not treat yourself. This drug decreases your body's ability to fight infections. Try to avoid being around people who are sick. This medicine may increase your risk to bruise or bleed. Call your doctor or health care professional if you notice any unusual bleeding. Check with your doctor or health care professional if you get an attack of severe diarrhea, nausea and vomiting, or if you sweat a lot. The loss of too much body fluid can make it dangerous for you to take this medicine. Talk to your doctor about your risk of  cancer. You may be more at risk for certain types of cancers if you take this medicine. Both men and women must use effective birth control with this medicine. Do not become pregnant while taking this medicine or until at least 1 normal menstrual cycle has occurred after stopping it. Women should inform their doctor if they wish to become pregnant or think they might be pregnant. Men should not father a child while taking this medicine and for 3 months after stopping it. There is a potential for serious side effects to an unborn child. Talk to your health care professional or pharmacist for more information. Do not breast-feed an infant while taking this medicine. What side effects may I notice from receiving this medicine? Side effects that you should report to your doctor or health care professional as soon as possible:  allergic reactions like skin rash, itching or hives, swelling of the face, lips, or tongue  back pain  breathing problems or shortness of breath  confusion  diarrhea  dry, nonproductive cough  low blood counts - this medicine may decrease the number of white blood cells, red blood cells and platelets. You may be at increased risk of infections and bleeding  mouth sores  redness,  blistering, peeling or loosening of the skin, including inside the mouth  seizures  severe headaches  signs of infection - fever or chills, cough, sore throat, pain or difficulty passing urine  signs and symptoms of bleeding such as bloody or black, tarry stools; red or dark-brown urine; spitting up blood or brown material that looks like coffee grounds; red spots on the skin; unusual bruising or bleeding from the eye, gums, or nose  signs and symptoms of kidney injury like trouble passing urine or change in the amount of urine  signs and symptoms of liver injury like dark yellow or brown urine; general ill feeling or flu-like symptoms; light-colored stools; loss of appetite; nausea; right  upper belly pain; unusually weak or tired; yellowing of the eyes or skin  stiff neck  vomiting Side effects that usually do not require medical attention (report to your doctor or health care professional if they continue or are bothersome):  dizziness  hair loss  headache  stomach pain  upset stomach This list may not describe all possible side effects. Call your doctor for medical advice about side effects. You may report side effects to FDA at 1-800-FDA-1088. Where should I keep my medicine? If you are using this medicine at home, you will be instructed on how to store this medicine. Throw away any unused medicine after the expiration date on the label. NOTE: This sheet is a summary. It may not cover all possible information. If you have questions about this medicine, talk to your doctor, pharmacist, or health care provider.  2020 Elsevier/Gold Standard (2016-08-09 13:31:42)

## 2019-05-01 ENCOUNTER — Inpatient Hospital Stay: Payer: 59 | Attending: Oncology

## 2019-05-01 ENCOUNTER — Other Ambulatory Visit: Payer: Self-pay

## 2019-05-01 ENCOUNTER — Inpatient Hospital Stay (HOSPITAL_BASED_OUTPATIENT_CLINIC_OR_DEPARTMENT_OTHER): Payer: 59 | Admitting: Oncology

## 2019-05-01 ENCOUNTER — Telehealth: Payer: Self-pay

## 2019-05-01 VITALS — BP 183/84 | HR 72 | Temp 98.2°F | Resp 18 | Ht 61.0 in | Wt 215.3 lb

## 2019-05-01 DIAGNOSIS — D508 Other iron deficiency anemias: Secondary | ICD-10-CM

## 2019-05-01 DIAGNOSIS — Z7952 Long term (current) use of systemic steroids: Secondary | ICD-10-CM | POA: Diagnosis not present

## 2019-05-01 DIAGNOSIS — Z7951 Long term (current) use of inhaled steroids: Secondary | ICD-10-CM | POA: Diagnosis not present

## 2019-05-01 DIAGNOSIS — M069 Rheumatoid arthritis, unspecified: Secondary | ICD-10-CM | POA: Insufficient documentation

## 2019-05-01 DIAGNOSIS — D509 Iron deficiency anemia, unspecified: Secondary | ICD-10-CM | POA: Insufficient documentation

## 2019-05-01 DIAGNOSIS — D709 Neutropenia, unspecified: Secondary | ICD-10-CM | POA: Diagnosis not present

## 2019-05-01 DIAGNOSIS — D259 Leiomyoma of uterus, unspecified: Secondary | ICD-10-CM | POA: Diagnosis not present

## 2019-05-01 DIAGNOSIS — D72819 Decreased white blood cell count, unspecified: Secondary | ICD-10-CM | POA: Diagnosis present

## 2019-05-01 DIAGNOSIS — Z7982 Long term (current) use of aspirin: Secondary | ICD-10-CM | POA: Diagnosis not present

## 2019-05-01 DIAGNOSIS — Z79899 Other long term (current) drug therapy: Secondary | ICD-10-CM | POA: Diagnosis not present

## 2019-05-01 LAB — CBC WITH DIFFERENTIAL/PLATELET
Abs Immature Granulocytes: 0.01 10*3/uL (ref 0.00–0.07)
Basophils Absolute: 0 10*3/uL (ref 0.0–0.1)
Basophils Relative: 0 %
Eosinophils Absolute: 0.1 10*3/uL (ref 0.0–0.5)
Eosinophils Relative: 3 %
HCT: 41.2 % (ref 36.0–46.0)
Hemoglobin: 12.7 g/dL (ref 12.0–15.0)
Immature Granulocytes: 0 %
Lymphocytes Relative: 63 %
Lymphs Abs: 2.3 10*3/uL (ref 0.7–4.0)
MCH: 27.5 pg (ref 26.0–34.0)
MCHC: 30.8 g/dL (ref 30.0–36.0)
MCV: 89.4 fL (ref 80.0–100.0)
Monocytes Absolute: 0.3 10*3/uL (ref 0.1–1.0)
Monocytes Relative: 8 %
Neutro Abs: 0.9 10*3/uL — ABNORMAL LOW (ref 1.7–7.7)
Neutrophils Relative %: 26 %
Platelets: 198 10*3/uL (ref 150–400)
RBC: 4.61 MIL/uL (ref 3.87–5.11)
RDW: 14.5 % (ref 11.5–15.5)
WBC: 3.6 10*3/uL — ABNORMAL LOW (ref 4.0–10.5)
nRBC: 0 % (ref 0.0–0.2)

## 2019-05-01 NOTE — Progress Notes (Signed)
Hematology and Oncology Follow Up Visit  Amber [name] [id number] [birth year] 59 y.o. 05/01/2019 1:34 PM   Principle Diagnosis: 59 year old woman with leukocytopenia noted in 2015.  This is a reactive findings related to autoimmune disease and Plaquenil.  Secondary diagnosis: Iron deficiency anemia cyst at 2007.  Related to her uterine fibroids and chronic menstrual losses.        Prior Therapy:  She is status post IV iron infusion in the form of Feraheme given on multiple occasions and repeated as needed.  Last treatment was in March 2019.  Current therapy: Iron sulfate 325 mg by mouth but has not been taking it as of late.  Interim History: Ms. Delawder returns today for a repeat evaluation.  Since the last visit, she reports exacerbation of her rheumatoid arthritis.  She was started on Plaquenil with the laboratory data obtained on March 23 showed a decrease in her white cell count 3.3 with absolute neutrophil count of 1700.  She was switched to methotrexate injection although she has not started at this time till her labs are rechecked again.  Clinically, she continues to have issues with pain and stiffness related to rheumatoid arthritis.       Medications: Unchanged on review. Current Outpatient Medications  Medication Sig Dispense Refill  . albuterol (VENTOLIN HFA) 108 (90 Base) MCG/ACT inhaler Inhale 2 puffs into the lungs every 6 (six) hours as needed for wheezing or shortness of breath. 18 g 5  . aspirin EC 81 MG tablet Take 1 tablet (81 mg total) by mouth daily.    Marland Kitchen atorvastatin (LIPITOR) 40 MG tablet TAKE 1 TABLET BY MOUTH ONCE DAILY(PLEASE CALL AND SCHEDULE AN APPT FOR FURTHER REFILLS 1ST ATTEMPT) 30 tablet 0  . calcium-vitamin D (OSCAL WITH D) 500-200 MG-UNIT TABS tablet Take by mouth.    . CARTIA XT 120 MG 24 hr capsule TAKE 1 CAPSULE BY MOUTH ONCE DAILY(PLEASE CALL AND SCHEDULE AN APPT FOR FURTHER REFILLS 1ST ATTEMPT) 30 capsule 0  . Cyanocobalamin (VITAMIN B-12)  5000 MCG LOZG Take 1 tablet by mouth 2 (two) times daily.    . ferrous sulfate 325 (65 FE) MG tablet Take 325 mg by mouth 3 (three) times daily with meals.     . fluticasone (FLONASE) 50 MCG/ACT nasal spray Place 2 sprays into both nostrils daily. 16 g 1  . Fluticasone-Salmeterol (ADVAIR) 250-50 MCG/DOSE AEPB Inhale 1 puff into the lungs 2 (two) times daily. 60 each 5  . folic acid (FOLVITE) 1 MG tablet Take 1 tablet (1 mg total) by mouth daily. 90 tablet 1  . furosemide (LASIX) 20 MG tablet TAKE 1 TABLET BY MOUTH ONCE DAILY AS NEEDED FOR  FLUID 90 tablet 0  . hydrochlorothiazide (HYDRODIURIL) 25 MG tablet TAKE 1 TABLET BY MOUTH ONCE DAILY(PLEASE CALL AND SCHEDULE AN APPT FOR FURTHER REFILLS 1ST ATTEMPT) 30 tablet 0  . Lifitegrast (XIIDRA) 5 % SOLN Place 1 drop into both eyes 2 times daily.    Marland Kitchen lisinopril (ZESTRIL) 40 MG tablet Take 1 tablet by mouth once daily 30 tablet 0  . methotrexate 50 MG/2ML injection Inject 0.4 mL into the skin once weekly for 2 weeks, if labs are stable increase to 0.6 mL once weekly. 2 mL 0  . metoprolol succinate (TOPROL-XL) 100 MG 24 hr tablet TAKE 1 TABLET BY MOUTH ONCE DAILY(TAKE WITH OR IMMEDIATELY FOLLOWING A MEAL) 90 tablet 0  . ondansetron (ZOFRAN) 4 MG tablet TAKE 1 TABLET BY MOUTH EVERY 8 HOURS AS NEEDED FOR  NAUSEA AND VOMITING 20 tablet 0  . predniSONE (DELTASONE) 5 MG tablet Take 4 tablets by mouth daily x4 days, 3 tablets by mouth daily x4 days, 2 tablets by mouth daily x4 days, 1 tablet by mouth daily x4 days. 40 tablet 0  . SF 5000 PLUS 1.1 % CREA dental cream As directed.    . SUMAtriptan (IMITREX) 50 MG tablet TAKE 1 TABLET BY MOUTH EVERY 2 HOURS AS NEEDED FOR  MIGRAINE  MAY  REPEAT  IN  2  HOURS  IF  HEADACHE  PERSISTS  OR  RECURS 9 tablet 5  . TRAVATAN Z 0.004 % SOLN ophthalmic solution Place 1 drop into both eyes at bedtime.     . Tuberculin-Allergy Syringes 27G X 1/2" 1 ML MISC Use to inject methotrexate weekly. 12 each 1  . typhoid (VIVOTIF) DR  capsule One capsule on alternate days (day 1, 3, 5, and 7) for a total of 4 doses; all doses should be complete at least 1 week prior to potential exposure (Patient not taking: Reported on 01/29/2019) 4 capsule 0   No current facility-administered medications for this visit.     Allergies: No Known Allergies     Physical Exam:    Blood pressure (!) 183/84, pulse 72, temperature 98.2 F (36.8 C), temperature source Temporal, resp. rate 18, height 5\' 1"  (1.549 m), weight 215 lb 4.8 oz (97.7 kg), last menstrual period 08/09/2016, SpO2 100 %.     ECOG: 0    General appearance: Alert, awake without any distress. Head: Atraumatic without abnormalities Oropharynx: Without any thrush or ulcers. Eyes: No scleral icterus. Lymph nodes: No lymphadenopathy noted in the cervical, supraclavicular, or axillary nodes Heart:regular rate and rhythm, without any murmurs or gallops.   Lung: Clear to auscultation without any rhonchi, wheezes or dullness to percussion. Abdomin: Soft, nontender without any shifting dullness or ascites. Musculoskeletal: No clubbing or cyanosis. Neurological: No motor or sensory deficits. Skin: No rashes or lesions.        Lab Results: Lab Results  Component Value Date   WBC 3.3 (L) 03/24/2019   HGB 12.9 03/24/2019   HCT 40.5 03/24/2019   MCV 86.2 03/24/2019   PLT 216 03/24/2019     Chemistry      Component Value Date/Time   NA 141 03/24/2019 1223   NA 140 09/04/2017 1124   NA 138 01/21/2015 0935   K 4.0 03/24/2019 1223   K 3.9 01/21/2015 0935   CL 106 03/24/2019 1223   CL 108 (H) 06/25/2012 1508   CO2 27 03/24/2019 1223   CO2 25 01/21/2015 0935   BUN 8 03/24/2019 1223   BUN 10 09/04/2017 1124   BUN 7.0 01/21/2015 0935   CREATININE 0.68 03/24/2019 1223   CREATININE 0.8 01/21/2015 0935      Component Value Date/Time   CALCIUM 9.7 03/24/2019 1223   CALCIUM 9.4 01/21/2015 0935   ALKPHOS 62 09/10/2018 1040   ALKPHOS 54 01/21/2015 0935    AST 18 03/24/2019 1223   AST 17 01/21/2015 0935   ALT 16 03/24/2019 1223   ALT 15 01/21/2015 0935   BILITOT 0.7 03/24/2019 1223   BILITOT 1.0 09/04/2017 1124   BILITOT 0.55 01/21/2015 0935       Impression and Plan:  59 year old woman with the:     1. 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  Laboratory data from March 24, 2019 were reviewed and showed a white cell count of 3.3 with mild neutropenia.  Absolute neutrophil count of 719.  These findings are likely related to her autoimmune disease and Plaquenil and not a sign of a blood disorder.   The plan is to repeat laboratory testing from today and anticipate improvement in her counts.  She can start methotrexate based on these results and follow-up with rheumatology for adjustments of her medication for her autoimmune disease.   2.  Anemia: She was diagnosed with iron deficiency 2007 and has resolved at this time.  Hemoglobin in March 2021 is back to normal range.   3. Rheumatoid arthritis: She continues to follow with the rheumatology regarding this issue.  She is currently on Plaquenil.    4. Followup: We will be in 6 months for follow-up of progress.  30  minutes were dedicated to this visit. The time was spent on reviewing laboratory data, discussing treatment options, discussing differential diagnosis and answering questions regarding future plan.     Zola Button 4/30/20211:34 PM

## 2019-05-01 NOTE — Telephone Encounter (Signed)
Called patient and let her know per Dr. Alen Blew labs and wbc are improved as predicted. Patient verbalized understanding.

## 2019-05-01 NOTE — Telephone Encounter (Signed)
-----   Message from Amber Portela, MD sent at 05/01/2019  2:09 PM EDT ----- Please let her know her labs and wbc are improved as predicted.

## 2019-06-02 ENCOUNTER — Other Ambulatory Visit: Payer: Self-pay | Admitting: *Deleted

## 2019-09-02 ENCOUNTER — Other Ambulatory Visit (HOSPITAL_BASED_OUTPATIENT_CLINIC_OR_DEPARTMENT_OTHER): Payer: Self-pay | Admitting: Family

## 2019-09-02 ENCOUNTER — Other Ambulatory Visit: Payer: Self-pay | Admitting: Family Medicine

## 2019-09-02 ENCOUNTER — Other Ambulatory Visit: Payer: Self-pay | Admitting: Family

## 2019-09-02 DIAGNOSIS — Z1231 Encounter for screening mammogram for malignant neoplasm of breast: Secondary | ICD-10-CM

## 2019-09-02 MED ORDER — ASPIRIN EC 81 MG PO TBEC
81.0000 mg | DELAYED_RELEASE_TABLET | Freq: Every day | ORAL | Status: DC
Start: 2019-09-02 — End: 2020-01-31

## 2019-09-02 MED ORDER — LISINOPRIL 40 MG PO TABS
40.0000 mg | ORAL_TABLET | Freq: Every day | ORAL | 0 refills | Status: DC
Start: 2019-09-02 — End: 2019-11-17

## 2019-09-02 MED ORDER — ONDANSETRON HCL 4 MG PO TABS: 4.0000 mg | ORAL_TABLET | Freq: Three times a day (TID) | ORAL | 0 refills | Status: AC | PRN

## 2019-09-02 MED ORDER — ATORVASTATIN CALCIUM 40 MG PO TABS: 40.0000 mg | ORAL_TABLET | Freq: Every day | ORAL | 0 refills | Status: DC

## 2019-09-02 MED ORDER — FUROSEMIDE 20 MG PO TABS: ORAL_TABLET | ORAL | 0 refills | Status: DC

## 2019-09-03 ENCOUNTER — Other Ambulatory Visit: Payer: Self-pay

## 2019-09-03 MED ORDER — HYDROCHLOROTHIAZIDE 25 MG PO TABS
25.0000 mg | ORAL_TABLET | Freq: Every day | ORAL | 0 refills | Status: DC
Start: 2019-09-03 — End: 2019-09-21

## 2019-09-03 NOTE — Telephone Encounter (Signed)
Refills sent to pharmacy, upcoming appointment 09-21-19

## 2019-09-15 ENCOUNTER — Encounter: Payer: PRIVATE HEALTH INSURANCE | Admitting: Family

## 2019-09-21 ENCOUNTER — Other Ambulatory Visit: Payer: Self-pay

## 2019-09-21 ENCOUNTER — Encounter: Payer: Self-pay | Admitting: Family

## 2019-09-21 ENCOUNTER — Ambulatory Visit (INDEPENDENT_AMBULATORY_CARE_PROVIDER_SITE_OTHER): Payer: PRIVATE HEALTH INSURANCE | Admitting: Family

## 2019-09-21 ENCOUNTER — Telehealth: Payer: Self-pay | Admitting: Family

## 2019-09-21 VITALS — BP 164/80 | HR 65 | Temp 98.6°F | Resp 16 | Ht 61.0 in | Wt 213.0 lb

## 2019-09-21 DIAGNOSIS — I10 Essential (primary) hypertension: Secondary | ICD-10-CM

## 2019-09-21 DIAGNOSIS — E1142 Type 2 diabetes mellitus with diabetic polyneuropathy: Secondary | ICD-10-CM

## 2019-09-21 DIAGNOSIS — Z0000 Encounter for general adult medical examination without abnormal findings: Secondary | ICD-10-CM

## 2019-09-21 DIAGNOSIS — E785 Hyperlipidemia, unspecified: Secondary | ICD-10-CM | POA: Diagnosis not present

## 2019-09-21 DIAGNOSIS — H9192 Unspecified hearing loss, left ear: Secondary | ICD-10-CM | POA: Diagnosis not present

## 2019-09-21 DIAGNOSIS — E119 Type 2 diabetes mellitus without complications: Secondary | ICD-10-CM | POA: Diagnosis not present

## 2019-09-21 DIAGNOSIS — Z23 Encounter for immunization: Secondary | ICD-10-CM | POA: Diagnosis not present

## 2019-09-21 DIAGNOSIS — E1169 Type 2 diabetes mellitus with other specified complication: Secondary | ICD-10-CM | POA: Diagnosis not present

## 2019-09-21 MED ORDER — HYDROCHLOROTHIAZIDE 25 MG PO TABS: 25.0000 mg | ORAL_TABLET | Freq: Every day | ORAL | 1 refills | Status: DC

## 2019-09-21 MED ORDER — GABAPENTIN 300 MG PO CAPS: 300.0000 mg | ORAL_CAPSULE | Freq: Every day | ORAL | 1 refills | Status: DC

## 2019-09-21 NOTE — Progress Notes (Signed)
Subjective:    Patient ID: Amber [name], female    DOB: 02-16-1960, 59 y.o.   MRN: [id number]  HPI  Patient presents today for complete physical.  Immunizations: completed moderna series Diet: fair diet Wt Readings from Last 3 Encounters:  09/21/19 213 lb (96.6 kg)  05/01/19 215 lb 4.8 oz (97.7 kg)  03/31/19 211 lb 9.6 oz (96 kg)  Exercise: reports that exercise has been limited due to bone spurs in her feet.  Reports burning in both feet at night.  This has been present x 1 month.  Colonoscopy: last year [facility]) Dexa: 04-10-17 Pap Smear: 09/30/20 Mammogram: scheduled [facility]: last week.   HTN- on toprol xl 100mg , Cartia XT 120mg  Lisinopril 40mg  BP Readings from Last 3 Encounters:  09/21/19 (!) 164/80  05/01/19 (!) 183/84  03/31/19 (!) 179/103   C/o burning in both feet at night.  C/o hearing problem left ear    Review of Systems  Constitutional: Negative for unexpected weight change.  HENT: Positive for hearing loss (left ear decreased hearing). Negative for rhinorrhea.   Eyes: Negative for visual disturbance.  Respiratory: Negative for cough and shortness of breath.   Cardiovascular: Negative for chest pain.  Gastrointestinal: Negative for constipation and diarrhea.  Genitourinary: Negative for dyspareunia and frequency.  Musculoskeletal: Positive for arthralgias (bilateral foot pain, chronic RA pain).  Skin: Negative for rash.  Neurological: Positive for headaches (reports 4-5 headaches a week).  Hematological: Negative for adenopathy.  Psychiatric/Behavioral:       Denies depression/anxiety   Past Medical History:  Diagnosis Date  . Anemia   . Arthritis   . Blood transfusion without reported diagnosis   . Colon polyps   . Controlled type 2 diabetes mellitus without complication, without long-term current use of insulin ([place]) 05/31/2017  . Ectopic pregnancy    RIGHT salpingostomy  . Elective abortion   . GERD (gastroesophageal reflux disease)   .  Glaucoma    both eyes  . Headache    migraines  . History of chicken pox   . Hyperlipemia   . Hypertension   . Rheumatoid arthritis(714.0) 11/07/2011  . SAB (spontaneous abortion)   . Shortness of breath   . Sjogren's disease ([place])   . Wheezing      Social History   Socioeconomic History  . Marital status: Single    Spouse name: Not on file  . Number of children: 0  . Years of education: Not on file  . Highest education level: Not on file  Occupational History  . Occupation: LPN    Employer: [employer]  Tobacco Use  . Smoking status: Never Smoker  . Smokeless tobacco: Never Used  Vaping Use  . Vaping Use: Never used  Substance and Sexual Activity  . Alcohol use: No    Alcohol/week: 0.0 standard drinks  . Drug use: No  . Sexual activity: Yes    Birth control/protection: None    Comment: 1st intercourse- 17, partners- 5  Other Topics Concern  . 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  Had a sister up in [place] who passed away 04-11-15 who she was close to.   Born in [place]- she came to [place] as adult.  Age 69.  Social Determinants of Health   Financial Resource Strain:   . Difficulty of Paying Living Expenses: Not on file  Food Insecurity:   . Worried About Charity fundraiser in the Last Year: Not on file  . Ran Out of Food in the Last Year: Not on file  Transportation Needs:   . Lack of Transportation (Medical): Not on file  . Lack of Transportation (Non-Medical): Not on file  Physical Activity:   . Days of Exercise per Week: Not on file  . Minutes of Exercise per Session: Not on file  Stress:   . Feeling of Stress : Not on file  Social Connections:   . Frequency of Communication with Friends and Family: Not on file  . Frequency of Social Gatherings with Friends and  Family: Not on file  . Attends Religious Services: Not on file  . Active Member of Clubs or Organizations: Not on file  . Attends Archivist Meetings: Not on file  . Marital Status: Not on file  Intimate Partner Violence:   . Fear of Current or Ex-Partner: Not on file  . Emotionally Abused: Not on file  . Physically Abused: Not on file  . Sexually Abused: Not on file    Past Surgical History:  Procedure Laterality Date  . ABDOMINAL SURGERY     bowel repair  . COLONOSCOPY    . DILATION AND CURETTAGE OF UTERUS     X 2  . MENISCUS REPAIR Right    MENISCUS TEAR REPAIR  . MYOMECTOMY N/A 07/27/2014   Procedure: ABDOMINAL MYOMECTOMY;  Surgeon: Waymon Amato, MD;  Location: [facility];  Service: Gynecology;  Laterality: N/A;  . PELVIC LAPAROSCOPY  1994   IN 1999 LAPAROSCOPY WITH INCIDENTAL ENTEROTOMY REQUIRING LAPAROTOMY  . UTERINE FIBROID SURGERY     July 2016    Family History  Problem Relation Age of Onset  . Hypertension Mother        died from heart disease  . Heart disease Mother        deceased, unknown cause  . Stroke Mother   . Obesity Mother   . Diabetes Sister   . Hypertension Sister   . Esophageal cancer Sister   . Stomach cancer Sister   . Cancer Sister   . Diabetes Sister   . Rectal cancer Neg Hx   . Colon cancer Neg Hx     No Known Allergies  Current Outpatient Medications on File Prior to Visit  Medication Sig Dispense Refill  . albuterol (VENTOLIN HFA) 108 (90 Base) MCG/ACT inhaler Inhale 2 puffs into the lungs every 6 (six) hours as needed for wheezing or shortness of breath. 18 g 5  . aspirin EC 81 MG tablet Take 1 tablet (81 mg total) by mouth daily. 30 tablet   . atorvastatin (LIPITOR) 40 MG tablet Take 1 tablet (40 mg total) by mouth daily. 30 tablet 0  . calcium-vitamin D (OSCAL WITH D) 500-200 MG-UNIT TABS tablet Take by mouth.    . CARTIA XT 120 MG 24 hr capsule TAKE 1 CAPSULE BY MOUTH ONCE DAILY(PLEASE CALL AND SCHEDULE AN APPT FOR FURTHER  REFILLS 1ST ATTEMPT) 30 capsule 0  . Cyanocobalamin (VITAMIN B-12) 5000 MCG LOZG Take 1 tablet by mouth 2 (two) times daily.    . ferrous sulfate 325 (65 FE) MG tablet Take 325 mg by mouth 3 (three) times daily with meals.     . Fluticasone-Salmeterol (ADVAIR) 250-50 MCG/DOSE AEPB Inhale 1 puff into the lungs  2 (two) times daily. 60 each 5  . folic acid (FOLVITE) 1 MG tablet Take 1 tablet (1 mg total) by mouth daily. 90 tablet 1  . furosemide (LASIX) 20 MG tablet TAKE 1 TABLET BY MOUTH ONCE DAILY AS NEEDED FOR  FLUID 90 tablet 0  . Lifitegrast (XIIDRA) 5 % SOLN Place 1 drop into both eyes 2 times daily.    Marland Kitchen lisinopril (ZESTRIL) 40 MG tablet Take 1 tablet (40 mg total) by mouth daily. 30 tablet 0  . methotrexate 50 MG/2ML injection Inject 0.4 mL into the skin once weekly for 2 weeks, if labs are stable increase to 0.6 mL once weekly. 2 mL 0  . metoprolol succinate (TOPROL-XL) 100 MG 24 hr tablet TAKE 1 TABLET BY MOUTH ONCE DAILY(TAKE WITH OR IMMEDIATELY FOLLOWING A MEAL) 90 tablet 0  . ondansetron (ZOFRAN) 4 MG tablet Take 1 tablet (4 mg total) by mouth every 8 (eight) hours as needed for nausea or vomiting. 20 tablet 0  . SF 5000 PLUS 1.1 % CREA dental cream As directed.    . SUMAtriptan (IMITREX) 50 MG tablet TAKE 1 TABLET BY MOUTH EVERY 2 HOURS AS NEEDED FOR  MIGRAINE  MAY  REPEAT  IN  2  HOURS  IF  HEADACHE  PERSISTS  OR  RECURS 9 tablet 5  . TRAVATAN Z 0.004 % SOLN ophthalmic solution Place 1 drop into both eyes at bedtime.     . Tuberculin-Allergy Syringes 27G X 1/2" 1 ML MISC Use to inject methotrexate weekly. 12 each 1   No current facility-administered medications on file prior to visit.    BP (!) 164/80 (BP Location: Right Arm, Patient Position: Sitting, Cuff Size: Large)   Pulse 65   Temp 98.6 F (37 C) (Temporal)   Resp 16   Ht 5\' 1"  (1.549 m)   Wt 213 lb (96.6 kg)   LMP 08/09/2016   SpO2 99%   BMI 40.25 kg/m       Objective:   Physical Exam  Physical Exam    Constitutional: She is oriented to person, place, and time. She appears well-developed and well-nourished. No distress.  HENT:  Head: Normocephalic and atraumatic.  Right Ear: Tympanic membrane and ear canal normal.  Left Ear: Tympanic membrane and ear canal normal.  Mouth/Throat: Not examined- pt wearing mask Eyes: Pupils are equal, round, and reactive to light. No scleral icterus.  Neck: Normal range of motion. No thyromegaly present.  Cardiovascular: Normal rate and regular rhythm.   No murmur heard. Pulmonary/Chest: Effort normal and breath sounds normal. No respiratory distress. He has no wheezes. She has no rales. She exhibits no tenderness.  Abdominal: Soft. Bowel sounds are normal. She exhibits no distension and no mass. There is no tenderness. There is no rebound and no guarding.  Musculoskeletal: She exhibits no edema.  Lymphadenopathy:    She has no cervical adenopathy.  Neurological: She is alert and oriented to person, place, and time. She has normal patellar reflexes. She exhibits normal muscle tone. Coordination normal.  Skin: Skin is warm and dry.  Psychiatric: She has a normal mood and affect. Her behavior is normal. Judgment and thought content normal.  Breast/pelvic: deferred       Assessment & Plan:        Assessment & Plan:  Preventative care- discussed healthy diet, exercise, weight loss.  Flu shot today.  Tetanus up to date. Mammo/pap/colo and dexa up to date.  HTN- uncontrolled. HCTZ was on her list but  she has not been taking. Will resume hctz. Follow up in 1 week.   Peripheral neuropathy- pain is bothering her at night. Recommended addition of gabapentin 300mg  PO QHS.  Hearing problem- refer to audiology.   This visit occurred during the SARS-CoV-2 public health emergency.  Safety protocols were in place, including screening questions prior to the visit, additional usage of staff PPE, and extensive cleaning of exam room while observing appropriate  contact time as indicated for disinfecting solutions.

## 2019-09-21 NOTE — Telephone Encounter (Signed)
Please call [facility] and request copy of most recent colonoscopy.

## 2019-09-21 NOTE — Patient Instructions (Signed)
Please complete lab work prior to leaving. Restart HCTZ for blood pressure.  Continue to work on [employer], exercise and weight loss.

## 2019-09-21 NOTE — Telephone Encounter (Signed)
Also, please call [facility] for eye exam.

## 2019-09-21 NOTE — Telephone Encounter (Signed)
Release of information faxed to [facility] and [facility]

## 2019-09-22 LAB — LIPID PANEL
Cholesterol: 139 mg/dL (ref <200)
HDL: 48 mg/dL — ABNORMAL LOW (ref >=50)
LDL Cholesterol (Calc): 73 mg/dL (calc)
Non-HDL Cholesterol (Calc): 91 mg/dL (calc) (ref <130)
Total CHOL/HDL Ratio: 2.9 (calc) (ref <5.0)
Triglycerides: 93 mg/dL (ref <150)

## 2019-09-22 LAB — COMPREHENSIVE METABOLIC PANEL
AG Ratio: 0.8 (calc) — ABNORMAL LOW (ref 1.0–2.5)
ALT: 18 U/L (ref 6–29)
AST: 18 U/L (ref 10–35)
Albumin: 3.9 g/dL (ref 3.6–5.1)
Alkaline phosphatase (APISO): 56 U/L (ref 37–153)
BUN: 8 mg/dL (ref 7–25)
CO2: 30 mmol/L (ref 20–32)
Calcium: 10.2 mg/dL (ref 8.6–10.4)
Chloride: 105 mmol/L (ref 98–110)
Creat: 0.71 mg/dL (ref 0.50–1.05)
Globulin: 5.2 g/dL (calc) — ABNORMAL HIGH (ref 1.9–3.7)
Glucose, Bld: 93 mg/dL (ref 65–99)
Potassium: 4.5 mmol/L (ref 3.5–5.3)
Sodium: 140 mmol/L (ref 135–146)
Total Bilirubin: 0.9 mg/dL (ref 0.2–1.2)
Total Protein: 9.1 g/dL — ABNORMAL HIGH (ref 6.1–8.1)

## 2019-09-22 LAB — HEMOGLOBIN A1C
Hgb A1c MFr Bld: 6 % of total Hgb — ABNORMAL HIGH (ref <5.7)
Mean Plasma Glucose: 126 (calc)
eAG (mmol/L): 7 (calc)

## 2019-09-23 ENCOUNTER — Other Ambulatory Visit: Payer: Self-pay | Admitting: Family

## 2019-09-23 DIAGNOSIS — I10 Essential (primary) hypertension: Secondary | ICD-10-CM

## 2019-09-23 MED ORDER — METOPROLOL SUCCINATE ER 100 MG PO TB24: 100.0000 mg | ORAL_TABLET | Freq: Every day | ORAL | 0 refills | Status: DC

## 2019-09-29 ENCOUNTER — Ambulatory Visit: Payer: PRIVATE HEALTH INSURANCE | Admitting: Family

## 2019-09-29 ENCOUNTER — Other Ambulatory Visit: Payer: Self-pay

## 2019-09-29 VITALS — BP 141/85 | HR 70 | Temp 98.6°F | Resp 16 | Wt 212.0 lb

## 2019-09-29 DIAGNOSIS — I10 Essential (primary) hypertension: Secondary | ICD-10-CM | POA: Diagnosis not present

## 2019-09-29 DIAGNOSIS — E669 Obesity, unspecified: Secondary | ICD-10-CM

## 2019-09-29 NOTE — Patient Instructions (Signed)
Please complete lab work prior to leaving.   

## 2019-09-29 NOTE — Progress Notes (Signed)
Subjective:    Patient ID: Amber [name], female    DOB: 08-28-1960, 59 y.o.   MRN: [id number]  HPI  Patient is a 59 yr old female who presents today for follow up of her hypertension. Last visit blood pressure was noted to be elevated. We restarted her hctz 25mg  once daily which she had not been taking. Other anti-hypertensives include  Toprol xl 100mg , Cartia XT 120mg , and lisinopril 40mg  once daily.  BP Readings from Last 3 Encounters:  09/29/19 (!) 141/85  09/21/19 (!) 164/80  05/01/19 (!) 183/84      Review of Systems  See HPI  Past Medical History:  Diagnosis Date  . Anemia   . Arthritis   . Blood transfusion without reported diagnosis   . Colon polyps   . Controlled type 2 diabetes mellitus without complication, without long-term current use of insulin ([place]) 05/31/2017  . Ectopic pregnancy    RIGHT salpingostomy  . Elective abortion   . GERD (gastroesophageal reflux disease)   . Glaucoma    both eyes  . Headache    migraines  . History of chicken pox   . Hyperlipemia   . Hypertension   . Rheumatoid arthritis(714.0) 11/07/2011  . SAB (spontaneous abortion)   . Shortness of breath   . Sjogren's disease ([place])   . Wheezing      Social History   Socioeconomic History  . Marital status: Single    Spouse name: Not on file  . Number of children: 0  . Years of education: Not on file  . Highest education level: Not on file  Occupational History  . Occupation: LPN    Employer: [employer]  Tobacco Use  . Smoking status: Never Smoker  . Smokeless tobacco: Never Used  Vaping Use  . Vaping Use: Never used  Substance and Sexual Activity  . Alcohol use: No    Alcohol/week: 0.0 standard drinks  . Drug use: No  . Sexual activity: Yes    Birth control/protection: None    Comment: 1st intercourse- 17, partners- 5  Other Topics Concern  . 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  Had a sister up in [place] who passed away April 18, 2015 who she was close to.   Born in [place]- she came to [place] as adult.  Age 59.          Social Determinants of Health   Financial Resource Strain:   . Difficulty of Paying Living Expenses: Not on file  Food Insecurity:   . Worried About Charity fundraiser in the Last Year: Not on file  . Ran Out of Food in the Last Year: Not on file  Transportation Needs:   . Lack of Transportation (Medical): Not on file  . Lack of Transportation (Non-Medical): Not on file  Physical Activity:   . Days of Exercise per Week: Not on file  . Minutes of Exercise per Session: Not on file  Stress:   . Feeling of Stress : Not on file  Social Connections:   . Frequency of Communication with Friends and Family: Not on file  . Frequency of Social Gatherings with Friends and Family: Not on file  . Attends Religious Services: Not on file  . Active  Member of Clubs or Organizations: Not on file  . Attends Archivist Meetings: Not on file  . Marital Status: Not on file  Intimate Partner Violence:   . Fear of Current or Ex-Partner: Not on file  . Emotionally Abused: Not on file  . Physically Abused: Not on file  . Sexually Abused: Not on file    Past Surgical History:  Procedure Laterality Date  . ABDOMINAL SURGERY     bowel repair  . COLONOSCOPY    . DILATION AND CURETTAGE OF UTERUS     X 2  . MENISCUS REPAIR Right    MENISCUS TEAR REPAIR  . MYOMECTOMY N/A 07/27/2014   Procedure: ABDOMINAL MYOMECTOMY;  Surgeon: Waymon Amato, MD;  Location: [facility];  Service: Gynecology;  Laterality: N/A;  . PELVIC LAPAROSCOPY  1994   IN 1999 LAPAROSCOPY WITH INCIDENTAL ENTEROTOMY REQUIRING LAPAROTOMY  . UTERINE FIBROID SURGERY     July 2016    Family History  Problem Relation Age of Onset  . Hypertension Mother        died from heart disease  . Heart  disease Mother        deceased, unknown cause  . Stroke Mother   . Obesity Mother   . Diabetes Sister   . Hypertension Sister   . Esophageal cancer Sister   . Stomach cancer Sister   . Cancer Sister   . Diabetes Sister   . Rectal cancer Neg Hx   . Colon cancer Neg Hx     No Known Allergies  Current Outpatient Medications on File Prior to Visit  Medication Sig Dispense Refill  . albuterol (VENTOLIN HFA) 108 (90 Base) MCG/ACT inhaler Inhale 2 puffs into the lungs every 6 (six) hours as needed for wheezing or shortness of breath. 18 g 5  . aspirin EC 81 MG tablet Take 1 tablet (81 mg total) by mouth daily. 30 tablet   . atorvastatin (LIPITOR) 40 MG tablet Take 1 tablet (40 mg total) by mouth daily. 30 tablet 0  . calcium-vitamin D (OSCAL WITH D) 500-200 MG-UNIT TABS tablet Take by mouth.    . CARTIA XT 120 MG 24 hr capsule TAKE 1 CAPSULE BY MOUTH ONCE DAILY(PLEASE CALL AND SCHEDULE AN APPT FOR FURTHER REFILLS 1ST ATTEMPT) 30 capsule 0  . Cyanocobalamin (VITAMIN B-12) 5000 MCG LOZG Take 1 tablet by mouth 2 (two) times daily.    . ferrous sulfate 325 (65 FE) MG tablet Take 325 mg by mouth 3 (three) times daily with meals.     . Fluticasone-Salmeterol (ADVAIR) 250-50 MCG/DOSE AEPB Inhale 1 puff into the lungs 2 (two) times daily. 60 each 5  . folic acid (FOLVITE) 1 MG tablet Take 1 tablet (1 mg total) by mouth daily. 90 tablet 1  . furosemide (LASIX) 20 MG tablet TAKE 1 TABLET BY MOUTH ONCE DAILY AS NEEDED FOR  FLUID 90 tablet 0  . gabapentin (NEURONTIN) 300 MG capsule Take 1 capsule (300 mg total) by mouth at bedtime. 90 capsule 1  . hydrochlorothiazide (HYDRODIURIL) 25 MG tablet Take 1 tablet (25 mg total) by mouth daily. 90 tablet 1  . Lifitegrast (XIIDRA) 5 % SOLN Place 1 drop into both eyes 2 times daily.    Marland Kitchen lisinopril (ZESTRIL) 40 MG tablet Take 1 tablet (40 mg total) by mouth daily. 30 tablet 0  . methotrexate 50 MG/2ML injection Inject 0.4 mL into the skin once weekly for 2 weeks,  if labs are stable increase to 0.6 mL  once weekly. 2 mL 0  . metoprolol succinate (TOPROL-XL) 100 MG 24 hr tablet Take 1 tablet (100 mg total) by mouth daily. Take with or immediately following a meal. 90 tablet 0  . ondansetron (ZOFRAN) 4 MG tablet Take 1 tablet (4 mg total) by mouth every 8 (eight) hours as needed for nausea or vomiting. 20 tablet 0  . SF 5000 PLUS 1.1 % CREA dental cream As directed.    . SUMAtriptan (IMITREX) 50 MG tablet TAKE 1 TABLET BY MOUTH EVERY 2 HOURS AS NEEDED FOR  MIGRAINE  MAY  REPEAT  IN  2  HOURS  IF  HEADACHE  PERSISTS  OR  RECURS 9 tablet 5  . TRAVATAN Z 0.004 % SOLN ophthalmic solution Place 1 drop into both eyes at bedtime.     . Tuberculin-Allergy Syringes 27G X 1/2" 1 ML MISC Use to inject methotrexate weekly. 12 each 1   No current facility-administered medications on file prior to visit.    BP (!) 141/85 (BP Location: Right Arm, Patient Position: Sitting, Cuff Size: Large)   Pulse 70   Temp 98.6 F (37 C) (Oral)   Resp 16   Wt 212 lb (96.2 kg)   LMP 08/09/2016   SpO2 100%   BMI 40.06 kg/m        Objective:   Physical Exam Constitutional:      Appearance: She is well-developed.  Neck:     Thyroid: No thyromegaly.  Cardiovascular:     Rate and Rhythm: Normal rate and regular rhythm.     Heart sounds: Normal heart sounds. No murmur heard.   Pulmonary:     Effort: Pulmonary effort is normal. No respiratory distress.     Breath sounds: Normal breath sounds. No wheezing.  Musculoskeletal:     Cervical back: Neck supple.  Skin:    General: Skin is warm and dry.  Neurological:     Mental Status: She is alert and oriented to person, place, and time.  Psychiatric:        Behavior: Behavior normal.        Thought Content: Thought content normal.        Judgment: Judgment normal.             Assessment & Plan:  HTN- bp is much improved since we added HCTZ 25mg . Continue HCTZ as well as Toprol xl 100mg , Cartia XT 120mg , and  lisinopril 40mg  once daily.   Obesity- pt states that our medical weight loss clinic does not accept her current insurance. Recommended that she schedule with nutrition instead. Referral has been placed.  This visit occurred during the SARS-CoV-2 public health emergency.  Safety protocols were in place, including screening questions prior to the visit, additional usage of staff PPE, and extensive cleaning of exam room while observing appropriate contact time as indicated for disinfecting solutions.

## 2019-09-30 LAB — BASIC METABOLIC PANEL
BUN: 11 mg/dL (ref 7–25)
CO2: 30 mmol/L (ref 20–32)
Calcium: 9.8 mg/dL (ref 8.6–10.4)
Chloride: 102 mmol/L (ref 98–110)
Creat: 0.73 mg/dL (ref 0.50–1.05)
Glucose, Bld: 110 mg/dL — ABNORMAL HIGH (ref 65–99)
Potassium: 4 mmol/L (ref 3.5–5.3)
Sodium: 138 mmol/L (ref 135–146)

## 2019-10-06 ENCOUNTER — Ambulatory Visit: Payer: PRIVATE HEALTH INSURANCE | Attending: Family | Admitting: Audiologist

## 2019-10-06 ENCOUNTER — Other Ambulatory Visit: Payer: Self-pay

## 2019-10-06 ENCOUNTER — Telehealth: Payer: Self-pay | Admitting: Family

## 2019-10-06 DIAGNOSIS — H9072 Mixed conductive and sensorineural hearing loss, unilateral, left ear, with unrestricted hearing on the contralateral side: Secondary | ICD-10-CM | POA: Diagnosis present

## 2019-10-06 DIAGNOSIS — H9202 Otalgia, left ear: Secondary | ICD-10-CM | POA: Diagnosis not present

## 2019-10-06 DIAGNOSIS — H9192 Unspecified hearing loss, left ear: Secondary | ICD-10-CM

## 2019-10-06 NOTE — Telephone Encounter (Signed)
See order(s).

## 2019-10-06 NOTE — Procedures (Signed)
  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 Pa is experiencing significant pain in her left ear. She says the pain onset randomly and can last over an hour at a time. There is no perceived hearing loss when the pain onsets. She also feels dizzy like she is spinning and has significant pressure like she is on an airplane. The pain does not radiate. She has some tinnitus but it is not bothersome. These episodes began about a month ago. There was no event, such as injury or infection, when this started.  The symptoms never occur in the right ear.  Growing up [place] had lots of ear infections. When she was very young a rock got stuck in her ear. There is no family history of hearing loss or of these symptoms.  Medical history positive for diabetes which is a risk factor for hearing loss. No other relevant case history reported.   Evaluation:   Otoscopy showed a clear view of the tympanic membranes, bilaterally  Tympanometry results were consistent with normal middle ear function, bilaterally   Audiometric testing was completed using conventional audiometry with insert transducer. Speech Recognition Thresholds were consistent with pure tone averages. Word Recognition was excellent at conversation level. Pure tone thresholds show normal hearing in the right ear and a mild mixed hearing loss in the left ear. Test results are consistent with air bone gaps at 2k and 3k Hz in the left ear.     Results:  The test results were reviewed with [place]. She needs to be seen by an ENT Physician due to her symptoms. She needs a medical evaluation to figure out the possible causes. Her hearing is excellent in the right ear and she only has a mild loss in her left ear. Mahati was given a copy of today's results. The full report will be mailed home as well. She was encouraged to bring both to her ENT appointment. Sherol reported understanding all that was discussed today.   Recommendations: 1. Immediate referral to ENT Physician necessary due to otalgia, vertigo, and mixed asymmetric hearing loss in the left ear.    Alfonse Alpers  Audiologist, Au.D., CCC-A 10/06/2019  8:37 AM  Cc: [name], NP

## 2019-10-12 ENCOUNTER — Other Ambulatory Visit: Payer: Self-pay

## 2019-10-12 ENCOUNTER — Encounter: Payer: Self-pay | Admitting: Family

## 2019-10-12 ENCOUNTER — Telehealth (INDEPENDENT_AMBULATORY_CARE_PROVIDER_SITE_OTHER): Payer: PRIVATE HEALTH INSURANCE | Admitting: Family

## 2019-10-12 VITALS — Temp 98.4°F

## 2019-10-12 DIAGNOSIS — J40 Bronchitis, not specified as acute or chronic: Secondary | ICD-10-CM

## 2019-10-12 MED ORDER — GUAIFENESIN-CODEINE 100-10 MG/5ML PO SYRP
5.0000 mL | ORAL_SOLUTION | Freq: Three times a day (TID) | ORAL | 0 refills | Status: DC | PRN
Start: 2019-10-12 — End: 2020-03-07

## 2019-10-12 MED ORDER — ALBUTEROL SULFATE HFA 108 (90 BASE) MCG/ACT IN AERS: 2.0000 | INHALATION_SPRAY | Freq: Four times a day (QID) | RESPIRATORY_TRACT | 5 refills | Status: DC | PRN

## 2019-10-12 MED ORDER — AZITHROMYCIN 250 MG PO TABS: ORAL_TABLET | ORAL | 0 refills | Status: DC

## 2019-10-12 NOTE — Progress Notes (Signed)
Virtual Visit via Video Note  I connected with Amber [name] on 10/12/19 at  9:00 AM EDT by a video enabled telemedicine application and verified that I am speaking with the correct person using two identifiers.  The patient was able to see me on the video, but I was not able to see her due to an issue with her camera.   Location: Patient: home Provider: work   I discussed the limitations of evaluation and management by telemedicine and the availability of in person appointments. The patient expressed understanding and agreed to proceed. Only the patient and myself were present for today's video call.   History of Present Illness:  Patient reports that she developed head congestion on Wednesday.  She had associated cough.  She had a negative covid test on Wednesday.  Took aleve and robitussin. Had to come home on Friday.  Yesterday she started having mild wheezing.  She had moderna series.  She denies fever.  Reports no loss of taste or smell. Nasal drainage is clear.  Chest mucous is yellow.   Observations/Objective:   Gen: Awake, alert, no acute distress- coughing frequently Resp: Breathing sounds even and non-labored Psych: calm/pleasant demeanor Neuro: Alert and Oriented x 3, speech is clear.   Assessment and Plan:  Bronchitis- new. Will rx with azithromycin, albuterol prn wheezing. Rx also sent for cheratussin. She is advised not to drive after taking due to risk of drowsiness. She is advised to call if symptoms worsen or if symptoms fail to improve.   Follow Up Instructions:    I discussed the assessment and treatment plan with the patient. The patient was provided an opportunity to ask questions and all were answered. The patient agreed with the plan and demonstrated an understanding of the instructions.   The patient was advised to call back or seek an in-person evaluation if the symptoms worsen or if the condition fails to improve as anticipated.  Amber Pear, NP

## 2019-10-19 ENCOUNTER — Other Ambulatory Visit: Payer: Self-pay

## 2019-10-19 ENCOUNTER — Ambulatory Visit (HOSPITAL_BASED_OUTPATIENT_CLINIC_OR_DEPARTMENT_OTHER)
Admission: RE | Admit: 2019-10-19 | Discharge: 2019-10-19 | Disposition: A | Discharge status: Home / Self Care | Payer: PRIVATE HEALTH INSURANCE | Source: Ambulatory Visit | Attending: Family | Admitting: Family

## 2019-10-19 DIAGNOSIS — Z1231 Encounter for screening mammogram for malignant neoplasm of breast: Secondary | ICD-10-CM | POA: Diagnosis present

## 2019-10-21 ENCOUNTER — Telehealth: Payer: Self-pay | Admitting: Family

## 2019-10-21 NOTE — Telephone Encounter (Signed)
I do not see contraindication to dental procedure.  If there is a high chance of bleeding from her procedure then she should hold aspirin for 7 days prior to procedure.

## 2019-10-21 NOTE — Telephone Encounter (Signed)
Caller: Diane, Dr.Wise  Call Back # [phone]  Calling to see if patient has any precaution that would prevent her from having dental procedure > Please call DDS back to let her know . Per Diane, she will send a report over to our about the procedure patient will have.

## 2019-10-22 MED ORDER — GABAPENTIN 100 MG PO CAPS: 100.0000 mg | ORAL_CAPSULE | Freq: Every day | ORAL | 1 refills | Status: DC

## 2019-10-22 NOTE — Telephone Encounter (Signed)
Patient advised of new dose sent in to her pharmacy

## 2019-10-22 NOTE — Telephone Encounter (Signed)
Information faxed to dentist and patient called and aware to dc aspirin for 7 days prior to procedure if any risk of bleeding.  Patient verbalized understanding.  She wanted to know if her dose of Gabapentin can be reduced. She reports medication is "too strong and makes her really sleepy"

## 2019-10-22 NOTE — Telephone Encounter (Signed)
Yes, I sent an rx for gabapentin 100mg  instead.

## 2019-10-27 ENCOUNTER — Ambulatory Visit: Payer: PRIVATE HEALTH INSURANCE | Admitting: Registered"

## 2019-10-30 ENCOUNTER — Inpatient Hospital Stay: Payer: 59

## 2019-10-30 ENCOUNTER — Inpatient Hospital Stay: Payer: 59 | Attending: Oncology | Admitting: Oncology

## 2019-10-30 ENCOUNTER — Other Ambulatory Visit: Payer: Self-pay

## 2019-10-30 VITALS — BP 163/88 | HR 69 | Temp 97.8°F | Resp 20 | Ht 61.0 in | Wt 213.9 lb

## 2019-10-30 DIAGNOSIS — M069 Rheumatoid arthritis, unspecified: Secondary | ICD-10-CM | POA: Insufficient documentation

## 2019-10-30 DIAGNOSIS — Z7982 Long term (current) use of aspirin: Secondary | ICD-10-CM | POA: Diagnosis not present

## 2019-10-30 DIAGNOSIS — Z7951 Long term (current) use of inhaled steroids: Secondary | ICD-10-CM | POA: Diagnosis not present

## 2019-10-30 DIAGNOSIS — D259 Leiomyoma of uterus, unspecified: Secondary | ICD-10-CM | POA: Insufficient documentation

## 2019-10-30 DIAGNOSIS — D709 Neutropenia, unspecified: Secondary | ICD-10-CM | POA: Diagnosis not present

## 2019-10-30 DIAGNOSIS — N920 Excessive and frequent menstruation with regular cycle: Secondary | ICD-10-CM | POA: Diagnosis present

## 2019-10-30 DIAGNOSIS — Z79899 Other long term (current) drug therapy: Secondary | ICD-10-CM | POA: Insufficient documentation

## 2019-10-30 DIAGNOSIS — D508 Other iron deficiency anemias: Secondary | ICD-10-CM

## 2019-10-30 DIAGNOSIS — D500 Iron deficiency anemia secondary to blood loss (chronic): Secondary | ICD-10-CM | POA: Insufficient documentation

## 2019-10-30 LAB — CBC WITH DIFFERENTIAL/PLATELET
Abs Immature Granulocytes: 0.01 10*3/uL (ref 0.00–0.07)
Basophils Absolute: 0 10*3/uL (ref 0.0–0.1)
Basophils Relative: 1 %
Eosinophils Absolute: 0.1 10*3/uL (ref 0.0–0.5)
Eosinophils Relative: 2 %
HCT: 40 % (ref 36.0–46.0)
Hemoglobin: 12.9 g/dL (ref 12.0–15.0)
Immature Granulocytes: 0 %
Lymphocytes Relative: 62 %
Lymphs Abs: 2.1 10*3/uL (ref 0.7–4.0)
MCH: 28.2 pg (ref 26.0–34.0)
MCHC: 32.3 g/dL (ref 30.0–36.0)
MCV: 87.3 fL (ref 80.0–100.0)
Monocytes Absolute: 0.3 10*3/uL (ref 0.1–1.0)
Monocytes Relative: 7 %
Neutro Abs: 1 10*3/uL — ABNORMAL LOW (ref 1.7–7.7)
Neutrophils Relative %: 28 %
Platelets: 228 10*3/uL (ref 150–400)
RBC: 4.58 MIL/uL (ref 3.87–5.11)
RDW: 14 % (ref 11.5–15.5)
WBC: 3.5 10*3/uL — ABNORMAL LOW (ref 4.0–10.5)
nRBC: 0 % (ref 0.0–0.2)

## 2019-10-30 NOTE — Progress Notes (Signed)
Hematology and Oncology Follow Up Visit  Amber [name] [id number] [birth year] 59 y.o. 10/30/2019 11:33 AM   Principle Diagnosis: 59 year old woman with neutropenia diagnosed in 2015.  Etiology is related to autoimmune process versus medication.  Work-up did not reveal any hematological disorder.    Secondary diagnosis: Iron deficiency anemia related to her uterine fibroids and chronic menstrual losses noted in 2017.     Prior Therapy:  She is status post IV iron infusion in the form of Feraheme given on multiple occasions and repeated as needed.  Last treatment was in March 2019.  Current therapy: Iron sulfate 325 mg by mouth as needed.  Interim History: Amber [name] r presents today for a follow-up visit.  Since her last visit, he reports no major changes in her health.  He denies any recent hospitalization or illnesses.  She denies any recurrent test infections or or illnesses.  Performance status quality of life remain excellent.  She does report occasional flaring of her rheumatoid arthritis and joint stiffness.  She was recommended to start on methotrexate which she has not done so.       Medications: Reviewed without changes. Current Outpatient Medications  Medication Sig Dispense Refill  . albuterol (VENTOLIN HFA) 108 (90 Base) MCG/ACT inhaler Inhale 2 puffs into the lungs every 6 (six) hours as needed for wheezing or shortness of breath. 18 g 5  . aspirin EC 81 MG tablet Take 1 tablet (81 mg total) by mouth daily. 30 tablet   . atorvastatin (LIPITOR) 40 MG tablet Take 1 tablet (40 mg total) by mouth daily. 30 tablet 0  . azithromycin (ZITHROMAX) 250 MG tablet Take 2 tabs by mouth today, then one tab by mouth once daily for 4 more days. 6 tablet 0  . calcium-vitamin D (OSCAL WITH D) 500-200 MG-UNIT TABS tablet Take by mouth.    . CARTIA XT 120 MG 24 hr capsule TAKE 1 CAPSULE BY MOUTH ONCE DAILY(PLEASE CALL AND SCHEDULE AN APPT FOR FURTHER REFILLS 1ST ATTEMPT) 30 capsule 0  .  Cyanocobalamin (VITAMIN B-12) 5000 MCG LOZG Take 1 tablet by mouth 2 (two) times daily.    . ferrous sulfate 325 (65 FE) MG tablet Take 325 mg by mouth 3 (three) times daily with meals.     . Fluticasone-Salmeterol (ADVAIR) 250-50 MCG/DOSE AEPB Inhale 1 puff into the lungs 2 (two) times daily. 60 each 5  . folic acid (FOLVITE) 1 MG tablet Take 1 tablet (1 mg total) by mouth daily. 90 tablet 1  . furosemide (LASIX) 20 MG tablet TAKE 1 TABLET BY MOUTH ONCE DAILY AS NEEDED FOR  FLUID 90 tablet 0  . gabapentin (NEURONTIN) 100 MG capsule Take 1 capsule (100 mg total) by mouth at bedtime. 90 capsule 1  . guaiFENesin-codeine (ROBITUSSIN AC) 100-10 MG/5ML syrup Take 5 mLs by mouth 3 (three) times daily as needed for cough. 75 mL 0  . hydrochlorothiazide (HYDRODIURIL) 25 MG tablet Take 1 tablet (25 mg total) by mouth daily. 90 tablet 1  . Lifitegrast (XIIDRA) 5 % SOLN Place 1 drop into both eyes 2 times daily.    Marland Kitchen lisinopril (ZESTRIL) 40 MG tablet Take 1 tablet (40 mg total) by mouth daily. 30 tablet 0  . metoprolol succinate (TOPROL-XL) 100 MG 24 hr tablet Take 1 tablet (100 mg total) by mouth daily. Take with or immediately following a meal. 90 tablet 0  . SF 5000 PLUS 1.1 % CREA dental cream As directed.    . SUMAtriptan (IMITREX) 50  MG tablet TAKE 1 TABLET BY MOUTH EVERY 2 HOURS AS NEEDED FOR  MIGRAINE  MAY  REPEAT  IN  2  HOURS  IF  HEADACHE  PERSISTS  OR  RECURS 9 tablet 5  . TRAVATAN Z 0.004 % SOLN ophthalmic solution Place 1 drop into both eyes at bedtime.     . Tuberculin-Allergy Syringes 27G X 1/2" 1 ML MISC Use to inject methotrexate weekly. 12 each 1   No current facility-administered medications for this visit.     Allergies: No Known Allergies     Physical Exam:    Blood pressure (!) 163/88, pulse 69, temperature 97.8 F (36.6 C), temperature source Tympanic, resp. rate 20, height 5\' 1"  (1.549 m), weight 213 lb 14.4 oz (97 kg), last menstrual period 08/09/2016, SpO2 100  %.     ECOG: 0   General appearance: Comfortable appearing without any discomfort Head: Normocephalic without any trauma Oropharynx: Mucous membranes are moist and pink without any thrush or ulcers. Eyes: Pupils are equal and round reactive to light. Lymph nodes: No cervical, supraclavicular, inguinal or axillary lymphadenopathy.   Heart:regular rate and rhythm.  S1 and S2 without leg edema. Lung: Clear without any rhonchi or wheezes.  No dullness to percussion. Abdomin: Soft, nontender, nondistended with good bowel sounds.  No hepatosplenomegaly. Musculoskeletal: No joint deformity or effusion.  Full range of motion noted. Neurological: No deficits noted on motor, sensory and deep tendon reflex exam. Skin: No petechial rash or dryness.  Appeared moist.         Lab Results: Lab Results  Component Value Date   WBC 3.5 (L) 10/30/2019   HGB 12.9 10/30/2019   HCT 40.0 10/30/2019   MCV 87.3 10/30/2019   PLT 228 10/30/2019     Chemistry      Component Value Date/Time   NA 138 09/29/2019 1137   NA 140 09/04/2017 1124   NA 138 01/21/2015 0935   K 4.0 09/29/2019 1137   K 3.9 01/21/2015 0935   CL 102 09/29/2019 1137   CL 108 (H) 06/25/2012 1508   CO2 30 09/29/2019 1137   CO2 25 01/21/2015 0935   BUN 11 09/29/2019 1137   BUN 10 09/04/2017 1124   BUN 7.0 01/21/2015 0935   CREATININE 0.73 09/29/2019 1137   CREATININE 0.8 01/21/2015 0935      Component Value Date/Time   CALCIUM 9.8 09/29/2019 1137   CALCIUM 9.4 01/21/2015 0935   ALKPHOS 62 09/10/2018 1040   ALKPHOS 54 01/21/2015 0935   AST 18 09/21/2019 1137   AST 17 01/21/2015 0935   ALT 18 09/21/2019 1137   ALT 15 01/21/2015 0935   BILITOT 0.9 09/21/2019 1137   BILITOT 1.0 09/04/2017 1124   BILITOT 0.55 01/21/2015 0935       Impression and Plan:  59 year old woman with:     1.  Neutropenia that has been fluctuating since 2015.  Etiology is related to medication versus autoimmune process.  Her work-up in  the past did not reveal any pathological malignancy.     Laboratory data on October 30, 2019 were personally reviewed today and showed a total white cell count of 3.5 with absolute neutrophil count of 1000.  The natural course of this disease and differential diagnosis was updated.  Autoimmune etiology versus other benign ethnic variation were also discussed.  At this time she does not require any growth factor support with adequate neutrophil count.  Growth factor support in the form of imaging is not required  at this time.   2.  Anemia related to iron deficiency and chronic menstrual blood losses.  Her hemoglobin is currently stable with iron studies previously have normalized.  We will continue to monitor periodically at this time.   3. Rheumatoid arthritis: She has declined taking methotrexate and currently follows with rheumatology regarding this issue.    6. Followup: In 6 months for repeat evaluation and laboratory testing.  30  minutes were dedicated to this encounter.  The spent on reviewing disease status, reviewing laboratory data, discussing differential diagnosis and management options for future.     Roxy Cedar Leocadio Heal 10/29/202111:33 AM

## 2019-11-11 ENCOUNTER — Encounter (INDEPENDENT_AMBULATORY_CARE_PROVIDER_SITE_OTHER): Payer: Self-pay

## 2019-11-11 ENCOUNTER — Encounter (INDEPENDENT_AMBULATORY_CARE_PROVIDER_SITE_OTHER): Payer: Self-pay | Admitting: Otolaryngology

## 2019-11-11 ENCOUNTER — Ambulatory Visit (INDEPENDENT_AMBULATORY_CARE_PROVIDER_SITE_OTHER): Payer: PRIVATE HEALTH INSURANCE | Admitting: Otolaryngology

## 2019-11-11 ENCOUNTER — Other Ambulatory Visit: Payer: Self-pay

## 2019-11-11 ENCOUNTER — Telehealth (INDEPENDENT_AMBULATORY_CARE_PROVIDER_SITE_OTHER): Payer: Self-pay

## 2019-11-11 ENCOUNTER — Other Ambulatory Visit (INDEPENDENT_AMBULATORY_CARE_PROVIDER_SITE_OTHER): Payer: Self-pay

## 2019-11-11 VITALS — Temp 97.0°F

## 2019-11-11 DIAGNOSIS — H9042 Sensorineural hearing loss, unilateral, left ear, with unrestricted hearing on the contralateral side: Secondary | ICD-10-CM

## 2019-11-11 NOTE — Progress Notes (Signed)
HPI: Amber [name] is a 59 y.o. female who presents is referred by her PCP for evaluation of left ear complaints.  Patient has noted chronic ringing in the left ear for the past 2 months as well as slight decreased hearing in the left ear.  She is also has some intermittent pain or discomfort in the left ear.  She underwent audiologic testing 1 month ago that demonstrated asymmetric left ear sensorineural hearing loss.  She had type A tympanograms bilaterally..  Past Medical History:  Diagnosis Date  . Anemia   . Arthritis   . Blood transfusion without reported diagnosis   . Colon polyps   . Controlled type 2 diabetes mellitus without complication, without long-term current use of insulin ([place]) 05/31/2017  . Ectopic pregnancy    RIGHT salpingostomy  . Elective abortion   . GERD (gastroesophageal reflux disease)   . Glaucoma    both eyes  . Headache    migraines  . History of chicken pox   . Hyperlipemia   . Hypertension   . Rheumatoid arthritis(714.0) 11/07/2011  . SAB (spontaneous abortion)   . Shortness of breath   . Sjogren's disease ([place])   . Wheezing    Past Surgical History:  Procedure Laterality Date  . ABDOMINAL SURGERY     bowel repair  . COLONOSCOPY    . DILATION AND CURETTAGE OF UTERUS     X 2  . MENISCUS REPAIR Right    MENISCUS TEAR REPAIR  . MYOMECTOMY N/A 07/27/2014   Procedure: ABDOMINAL MYOMECTOMY;  Surgeon: Waymon Amato, MD;  Location: [facility];  Service: Gynecology;  Laterality: N/A;  . PELVIC LAPAROSCOPY  1994   IN 1999 LAPAROSCOPY WITH INCIDENTAL ENTEROTOMY REQUIRING LAPAROTOMY  . UTERINE FIBROID SURGERY     July 2016   Social History   Socioeconomic History  . Marital status: Single    Spouse name: Not on file  . Number of children: 0  . Years of education: Not on file  . Highest education level: Not on file  Occupational History  . Occupation: LPN    Employer: [employer]  Tobacco Use  . Smoking status: Never Smoker  . Smokeless tobacco:  Never Used  Vaping Use  . Vaping Use: Never used  Substance and Sexual Activity  . Alcohol use: No    Alcohol/week: 0.0 standard drinks  . Drug use: No  . Sexual activity: Yes    Birth control/protection: None    Comment: 1st intercourse- 17, partners- 5  Other Topics Concern  . 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  Had a sister up in [place] who passed away 04-13-15 who she was close to.   Born [place]- she came to [place] as adult.  Age 43.          Social Determinants of Health   Financial Resource Strain:   . Difficulty of Paying Living Expenses: Not on file  Food Insecurity:   . Worried About Charity fundraiser in the Last Year: Not on file  . Ran Out of Food in the Last Year: Not on file  Transportation Needs:   . Lack of Transportation (Medical): Not on file  . Lack of Transportation (Non-Medical): Not on file  Physical  Activity:   . Days of Exercise per Week: Not on file  . Minutes of Exercise per Session: Not on file  Stress:   . Feeling of Stress : Not on file  Social Connections:   . Frequency of Communication with Friends and Family: Not on file  . Frequency of Social Gatherings with Friends and Family: Not on file  . Attends Religious Services: Not on file  . Active Member of Clubs or Organizations: Not on file  . Attends Archivist Meetings: Not on file  . Marital Status: Not on file   Family History  Problem Relation Age of Onset  . Hypertension Mother        died from heart disease  . Heart disease Mother        deceased, unknown cause  . Stroke Mother   . Obesity Mother   . Diabetes Sister   . Hypertension Sister   . Esophageal cancer Sister   . Stomach cancer Sister   . Cancer Sister   . Diabetes Sister   . Rectal cancer Neg Hx   . Colon cancer Neg  Hx    No Known Allergies Prior to Admission medications   Medication Sig Start Date End Date Taking? Authorizing Provider  albuterol (VENTOLIN HFA) 108 (90 Base) MCG/ACT inhaler Inhale 2 puffs into the lungs every 6 (six) hours as needed for wheezing or shortness of breath. 10/12/19  Yes [name], NP  aspirin EC 81 MG tablet Take 1 tablet (81 mg total) by mouth daily. 09/02/19  Yes [name], NP  azithromycin (ZITHROMAX) 250 MG tablet Take 2 tabs by mouth today, then one tab by mouth once daily for 4 more days. 10/12/19  Yes [name], NP  calcium-vitamin D (OSCAL WITH D) 500-200 MG-UNIT TABS tablet Take by mouth.   Yes [provider]  CARTIA XT 120 MG 24 hr capsule TAKE 1 CAPSULE BY MOUTH ONCE DAILY(PLEASE CALL AND SCHEDULE AN APPT FOR FURTHER REFILLS 1ST ATTEMPT) 02/11/19  Yes [name], NP  Cyanocobalamin (VITAMIN B-12) 5000 MCG LOZG Take 1 tablet by mouth 2 (two) times daily. 11/04/15  Yes [provider]  ferrous sulfate 325 (65 FE) MG tablet Take 325 mg by mouth 3 (three) times daily with meals.    Yes [provider]  Fluticasone-Salmeterol (ADVAIR) 250-50 MCG/DOSE AEPB Inhale 1 puff into the lungs 2 (two) times daily. 09/10/18  Yes [name], NP  folic acid (FOLVITE) 1 MG tablet Take 1 tablet (1 mg total) by mouth daily. 03/31/19  Yes Deveshwar, Abel Presto, MD  furosemide (LASIX) 20 MG tablet TAKE 1 TABLET BY MOUTH ONCE DAILY AS NEEDED FOR  FLUID 09/02/19  Yes [name], NP  gabapentin (NEURONTIN) 100 MG capsule Take 1 capsule (100 mg total) by mouth at bedtime. 10/22/19  Yes [name], NP  guaiFENesin-codeine (ROBITUSSIN AC) 100-10 MG/5ML syrup Take 5 mLs by mouth 3 (three) times daily as needed for cough. 10/12/19  Yes [name], NP  hydrochlorothiazide (HYDRODIURIL) 25 MG tablet Take 1 tablet (25 mg total) by mouth daily. 09/21/19  Yes [name], NP  Lifitegrast Shirley Friar) 5 % SOLN  Place 1 drop into both eyes 2 times daily. 12/09/18  Yes [provider]  lisinopril (ZESTRIL) 40 MG tablet Take 1 tablet (40 mg total) by mouth daily. 09/02/19  Yes [name], NP  metoprolol succinate (TOPROL-XL) 100 MG 24 hr tablet Take 1 tablet (100 mg total) by mouth daily. Take with or immediately following  a meal. 09/23/19  Yes [name], Lenna Sciara, NP  SF 5000 PLUS 1.1 % CREA dental cream As directed. 08/29/16  Yes [provider]  SUMAtriptan (IMITREX) 50 MG tablet TAKE 1 TABLET BY MOUTH EVERY 2 HOURS AS NEEDED FOR  MIGRAINE  MAY  REPEAT  IN  2  HOURS  IF  HEADACHE  PERSISTS  OR  RECURS 09/10/18  Yes [name], NP  TRAVATAN Z 0.004 % SOLN ophthalmic solution Place 1 drop into both eyes at bedtime.  10/25/11  Yes [provider]  Tuberculin-Allergy Syringes 27G X 1/2" 1 ML MISC Use to inject methotrexate weekly. 03/31/19  Yes Deveshwar, Abel Presto, MD  atorvastatin (LIPITOR) 40 MG tablet Take 1 tablet (40 mg total) by mouth daily. 09/02/19 10/02/19  [name], NP     Positive ROS: Otherwise negative  All other systems have been reviewed and were otherwise negative with the exception of those mentioned in the HPI and as above.  Physical Exam: Constitutional: Alert, well-appearing, no acute distress Ears: External ears without lesions or tenderness. Ear canals are clear bilaterally with no signs of infection.  TMs are clear bilaterally with good mobility on pneumatic otoscopy and no middle ear space abnormality noted.  On tuning fork testing she heard better in the right ear compared to the left with AC > BC bilaterally. Nasal: External nose without lesions. . Clear nasal passages with no signs of infection. Oral: Lips and gums without lesions. Tongue and palate mucosa without lesions. Posterior oropharynx clear.  Tonsils are small and benign in appearance bilaterally.  She has had no throat complaints. Neck: No palpable adenopathy or  masses Respiratory: Breathing comfortably  Skin: No facial/neck lesions or rash noted.  Procedures  Assessment: Asymmetric left ear sensorineural hearing loss with intermittent discomfort as well as episodes of dizziness. Otherwise normal ear and TM examination.  Plan: Suspect this is cochlear or retrocochlear in etiology. We will plan on scheduling a temporal bone MRI scan to further evaluate this and have her follow-up following the temporal bone MRI scan.   Radene Journey, MD   CC:

## 2019-11-12 ENCOUNTER — Other Ambulatory Visit: Payer: Self-pay

## 2019-11-12 ENCOUNTER — Encounter: Payer: Self-pay | Admitting: Registered"

## 2019-11-12 ENCOUNTER — Encounter: Payer: 59 | Attending: Family | Admitting: Registered"

## 2019-11-12 DIAGNOSIS — Z713 Dietary counseling and surveillance: Secondary | ICD-10-CM | POA: Insufficient documentation

## 2019-11-12 NOTE — Patient Instructions (Signed)
-   Go no longer than 5 hours without eating.   - Aim to have midday meal around 2 pm before going into work.   - Aim to have 1/2 plate non-starchy vegetables with lunch and dinner.   - Aim to increase physical activity 15-20 min, 2 days/week.

## 2019-11-12 NOTE — Progress Notes (Signed)
  Medical Nutrition Therapy:  Appt start time: 11:02 end time:  11:50.   Assessment:  Primary concerns today: Pt arrives stating A1c was borderline last time 6.0 (09/2019), decreased from 6.3 (09/2018); 6.0 (09/2017), 6.1 (09/2016). Recent labs also reveal normal Chol (139), decreased HDL (48), and normal Triglycerides (93).   Pt states she does not want to be diagnosed with diabetes. Reports family history; 3 sisters have diabetes. States she sees how it affects them. States she wants eating habits. States she does not exercise regularly and wants someone to help hold accountable. Reports she is afraid of needles and does not want to stick self with insulin shots if diagnosed with diabetes. States she likes to fry her vegetables. Has reduced amount of oil used.   Reports she is from [place] and states food here in [place] is different. Enjoys eating cultural foods.   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 Works 2nd shift 2:45-11:30 pm. States often times she doesn't leave work on time. Goes to bed around 1 am. Usually wakes up around 8 am; sleeping about 7 hours/night.   Pt expectations: plan or diet to help prevent diabetes   Preferred Learning Style:   No preference indicated   Learning Readiness:   Ready  Change in progress   MEDICATIONS: See list   DIETARY INTAKE:  Usual eating pattern includes 2 meals and 0 snacks per day.  Everyday foods include yoka, plaintains, sweet purple potatoes, peanut butter soup, and pepper soup.  Avoided foods include bacon, sausage, red meat (most times).    24-hr recall:  B ( AM): coffee (with sugar, creamer)  Snk (10 AM): bagel + eggs or McDonald's-egg, cheese McMuffin + coffee L ( PM): typically skips Snk ( PM):  D (7 PM): meat + rice/couscous/quinoa + sauce + fried vegetables or turmeric/curry rice + shrimp Snk ( PM):  Beverages: ginger tea (24 oz), coffee, Coke (12 oz), water (2*16 oz; 32 oz)  Usual physical activity:  none reported; ADLs  Estimated energy needs: 1600 calories 180 g carbohydrates 120 g protein 44 g fat  Progress Towards Goal(s):  In progress.   Nutritional Diagnosis:  NB-1.1 Food and nutrition-related knowledge deficit As related to prediabetes.  As evidenced by pt verablizes incomplete information.    Intervention:  Pt was educated and counseled on metabolism, importance of not skipping meals, effects of dieting/restriction, how carbohydrates work in the body, A1c ranges for prediabetes/diabetes, role of fiber in eating regimen, and importance of physical activity. Discussed meal/snack planning and how to balance meals/snacks using MyPlate for prediabetes. Pt was in agreement with goals listed. Goals: - Go no longer than 5 hours without eating.  - Aim to have midday meal around 2 pm before going into work.  - Aim to have 1/2 plate non-starchy vegetables with lunch and dinner.  - Aim to increase physical activity 15-20 min, 2 days/week.   Teaching Method Utilized:  Visual Auditory Hands on  Handouts given during visit include:  My Plate for prediabetes  Barriers to learning/adherence to lifestyle change: work-life balance  Demonstrated degree of understanding via:  Teach Back   Monitoring/Evaluation:  Dietary intake, exercise, and body weight in 1 month(s).

## 2019-11-17 ENCOUNTER — Other Ambulatory Visit: Payer: Self-pay | Admitting: Family

## 2019-11-17 DIAGNOSIS — I10 Essential (primary) hypertension: Secondary | ICD-10-CM

## 2019-12-01 ENCOUNTER — Ambulatory Visit
Admission: RE | Admit: 2019-12-01 | Discharge: 2019-12-01 | Disposition: A | Discharge status: Home / Self Care | Payer: 59 | Source: Ambulatory Visit | Attending: Otolaryngology | Admitting: Otolaryngology

## 2019-12-01 ENCOUNTER — Other Ambulatory Visit: Payer: Self-pay

## 2019-12-01 DIAGNOSIS — H9042 Sensorineural hearing loss, unilateral, left ear, with unrestricted hearing on the contralateral side: Secondary | ICD-10-CM

## 2019-12-01 MED ORDER — GADOBENATE DIMEGLUMINE 529 MG/ML IV SOLN
20.0000 mL | Freq: Once | INTRAVENOUS | Status: AC | PRN
  Administered 2019-12-01: 20 mL via INTRAVENOUS

## 2019-12-03 LAB — HM DIABETES EYE EXAM

## 2019-12-10 ENCOUNTER — Ambulatory Visit: Payer: 59 | Admitting: Registered"

## 2019-12-11 ENCOUNTER — Ambulatory Visit (INDEPENDENT_AMBULATORY_CARE_PROVIDER_SITE_OTHER): Payer: 59 | Admitting: Otolaryngology

## 2019-12-11 ENCOUNTER — Other Ambulatory Visit: Payer: Self-pay

## 2019-12-11 VITALS — Temp 95.7°F

## 2019-12-11 DIAGNOSIS — D485 Neoplasm of uncertain behavior of skin: Secondary | ICD-10-CM

## 2019-12-11 DIAGNOSIS — H9042 Sensorineural hearing loss, unilateral, left ear, with unrestricted hearing on the contralateral side: Secondary | ICD-10-CM | POA: Diagnosis not present

## 2019-12-11 NOTE — Progress Notes (Signed)
HPI: Amber [name] is a 59 y.o. female who returns today for evaluation of left ear discomfort and left ear hearing loss following MRI scan..  I reviewed the MRI scan report this demonstrated no evidence of cochlear or retrocochlear pathology.  She had some nonspecific changes consistent with the possible microvascular disease, or autoimmune disease or inflammatory problems.  But there was no evidence of any acute infection or neoplasm.  Patient still has some mild left ear discomfort and doesn't hear as well out of the left ear has had no drainage from the ear.  Past Medical History:  Diagnosis Date  . Anemia   . Arthritis   . Blood transfusion without reported diagnosis   . Colon polyps   . Controlled type 2 diabetes mellitus without complication, without long-term current use of insulin ([place]) 05/31/2017  . Ectopic pregnancy    RIGHT salpingostomy  . Elective abortion   . GERD (gastroesophageal reflux disease)   . Glaucoma    both eyes  . Headache    migraines  . History of chicken pox   . Hyperlipemia   . Hypertension   . Rheumatoid arthritis(714.0) 11/07/2011  . SAB (spontaneous abortion)   . Shortness of breath   . Sjogren's disease ([place])   . Wheezing    Past Surgical History:  Procedure Laterality Date  . ABDOMINAL SURGERY     bowel repair  . COLONOSCOPY    . DILATION AND CURETTAGE OF UTERUS     X 2  . MENISCUS REPAIR Right    MENISCUS TEAR REPAIR  . MYOMECTOMY N/A 07/27/2014   Procedure: ABDOMINAL MYOMECTOMY;  Surgeon: Waymon Amato, MD;  Location: [facility];  Service: Gynecology;  Laterality: N/A;  . PELVIC LAPAROSCOPY  1994   IN 1999 LAPAROSCOPY WITH INCIDENTAL ENTEROTOMY REQUIRING LAPAROTOMY  . UTERINE FIBROID SURGERY     July 2016   Social History   Socioeconomic History  . Marital status: Single    Spouse name: Not on file  . Number of children: 0  . Years of education: Not on file  . Highest education level: Not on file  Occupational History  . Occupation:  LPN    Employer: [employer]  Tobacco Use  . Smoking status: Never Smoker  . Smokeless tobacco: Never Used  Vaping Use  . Vaping Use: Never used  Substance and Sexual Activity  . Alcohol use: No    Alcohol/week: 0.0 standard drinks  . Drug use: No  . Sexual activity: Yes    Birth control/protection: None    Comment: 1st intercourse- 17, partners- 5  Other Topics Concern  . 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  Had a sister up in [place] who passed away 04/17/15 who she was close to.   Born in [place]- she came to [place] as adult.  Age 79.          Social Determinants of Health   Financial Resource Strain: Not on file  Food Insecurity: Not on file  Transportation Needs: Not on file  Physical Activity: Not on file  Stress: Not on file  Social Connections: Not on file   Family History  Problem Relation Age of Onset  . Hypertension Mother  died from heart disease  . Heart disease Mother        deceased, unknown cause  . Stroke Mother   . Obesity Mother   . Diabetes Sister   . Hypertension Sister   . Esophageal cancer Sister   . Stomach cancer Sister   . Cancer Sister   . Diabetes Sister   . Rectal cancer Neg Hx   . Colon cancer Neg Hx    No Known Allergies Prior to Admission medications   Medication Sig Start Date End Date Taking? Authorizing Provider  albuterol (VENTOLIN HFA) 108 (90 Base) MCG/ACT inhaler Inhale 2 puffs into the lungs every 6 (six) hours as needed for wheezing or shortness of breath. 10/12/19  Yes [name], NP  aspirin EC 81 MG tablet Take 1 tablet (81 mg total) by mouth daily. 09/02/19  Yes [name], NP  azithromycin (ZITHROMAX) 250 MG tablet Take 2 tabs by mouth today, then one tab by mouth once daily for 4 more days. 10/12/19  Yes  [name], NP  calcium-vitamin D (OSCAL WITH D) 500-200 MG-UNIT TABS tablet Take by mouth.   Yes [provider]  CARTIA XT 120 MG 24 hr capsule TAKE 1 CAPSULE BY MOUTH ONCE DAILY(PLEASE CALL AND SCHEDULE AN APPT FOR FURTHER REFILLS 1ST ATTEMPT) 02/11/19  Yes [name], NP  Cyanocobalamin (VITAMIN B-12) 5000 MCG LOZG Take 1 tablet by mouth 2 (two) times daily. 11/04/15  Yes [provider]  ferrous sulfate 325 (65 FE) MG tablet Take 325 mg by mouth 3 (three) times daily with meals.   Yes [provider]  Fluticasone-Salmeterol (ADVAIR) 250-50 MCG/DOSE AEPB Inhale 1 puff into the lungs 2 (two) times daily. 09/10/18  Yes [name], NP  folic acid (FOLVITE) 1 MG tablet Take 1 tablet (1 mg total) by mouth daily. 03/31/19  Yes Deveshwar, Abel Presto, MD  furosemide (LASIX) 20 MG tablet TAKE 1 TABLET BY MOUTH ONCE DAILY AS NEEDED FOR  FLUID 09/02/19  Yes [name], NP  gabapentin (NEURONTIN) 100 MG capsule Take 1 capsule (100 mg total) by mouth at bedtime. 10/22/19  Yes [name], NP  guaiFENesin-codeine (ROBITUSSIN AC) 100-10 MG/5ML syrup Take 5 mLs by mouth 3 (three) times daily as needed for cough. 10/12/19  Yes [name], NP  hydrochlorothiazide (HYDRODIURIL) 25 MG tablet Take 1 tablet (25 mg total) by mouth daily. 09/21/19  Yes [name], NP  Lifitegrast Shirley Friar) 5 % SOLN Place 1 drop into both eyes 2 times daily. 12/09/18  Yes [provider]  lisinopril (ZESTRIL) 40 MG tablet Take 1 tablet by mouth once daily 11/17/19  Yes [name], NP  lisinopril (ZESTRIL) 40 MG tablet Take 1 tablet by mouth once daily 11/17/19  Yes [name], NP  metoprolol succinate (TOPROL-XL) 100 MG 24 hr tablet TAKE 1 TABLET BY MOUTH ONCE DAILY WITH OR IMMEDIATELY FOLLOWING A MEAL 11/17/19  Yes [name], NP  SF 5000 PLUS 1.1 % CREA dental cream As directed. 08/29/16  Yes [provider]   SUMAtriptan (IMITREX) 50 MG tablet TAKE 1 TABLET BY MOUTH EVERY 2 HOURS AS NEEDED FOR  MIGRAINE  MAY  REPEAT  IN  2  HOURS  IF  HEADACHE  PERSISTS  OR  RECURS 09/10/18  Yes [name], NP  TRAVATAN Z 0.004 % SOLN ophthalmic solution Place 1 drop into both eyes at bedtime.  10/25/11  Yes [provider]  Tuberculin-Allergy Syringes 27G X 1/2" 1 ML MISC Use to inject methotrexate weekly.  03/31/19  Yes Deveshwar, Abel Presto, MD  atorvastatin (LIPITOR) 40 MG tablet Take 1 tablet (40 mg total) by mouth daily. 09/02/19 10/02/19  [name], NP     Positive ROS: Otherwise negative  All other systems have been reviewed and were otherwise negative with the exception of those mentioned in the HPI and as above.  Physical Exam: Constitutional: Alert, well-appearing, no acute distress Ears: External ears without lesions or tenderness.  Left ear canal is clear.  TM is clear.  There is no pain to examining the ear or wiggling the left ear. Nasal: External nose without lesions.. Clear nasal passages Oral: Lips and gums without lesions. Tongue and palate mucosa without lesions. Posterior oropharynx clear. Neck: No palpable adenopathy or masses.  No TMJ dysfunction noted.  Mild parotid gland enlargement but no tenderness to palpation.  Thyroid cyst noted on MRI scan consistent with changes of Sjogren's disease. Respiratory: Breathing comfortably  Skin: No facial/neck lesions or rash noted.  Procedures  Assessment: Questionable etiology of left ear discomfort and hearing loss.  No evidence of cochlear or retrocochlear pathology noted on MRI scan.  Plan: Suggested use of NSAIDs for treatment of ear discomfort. Reassured her of no evidence of cochlear or retrocochlear pathology noted on MRI scan.  This may be more of a autoimmune process.   Radene Journey, MD

## 2019-12-14 ENCOUNTER — Telehealth (INDEPENDENT_AMBULATORY_CARE_PROVIDER_SITE_OTHER): Payer: Self-pay

## 2019-12-14 NOTE — Progress Notes (Signed)
Office Visit Note  Patient: Amber [name]             Date of Birth: 05-15-1960           MRN: [id number]             PCP: [name], NP Referring: [name], NP Visit Date: 12/16/2019 Occupation: @GUAROCC @  Subjective:  Pain in multiple joints  History of Present Illness: Amber [name] is a 59 y.o. female with history of seropositive rheumatoid arthritis, Sjogren's syndrome, and osteoarthritis.  Patient is currently not taking any immunosuppressive agents.  She was previously taking Plaquenil but discontinued in July 2021.  She was evaluated in March 2021 at which time we discussed discontinuing Plaquenil and initiating methotrexate.  She did not start on methotrexate due to being apprehensive potential side effects.  She has been experiencing recurrent flares over the past several months since discontinuing Plaquenil.  She is currently having pain in both shoulder joints, left elbow joint, both wrists, both hands, both knees, both ankles, and both feet.  She has noticed intermittent swelling in both wrist joints and both ankle joints.  She has been taking Aleve 2 capsules by mouth twice daily for pain relief.  She continues to have ongoing sicca symptoms.  She was started on Zide eyedrops which have alleviated some of her symptoms of dry eyes.  She denies any other new questions or concerns.  She denies any recent infections.  Activities of Daily Living:  Patient reports morning stiffness for 10 minutes.   Patient Reports nocturnal pain.  Difficulty dressing/grooming: Denies Difficulty climbing stairs: Reports Difficulty getting out of chair: Reports Difficulty using hands for taps, buttons, cutlery, and/or writing: Reports  Review of Systems  Constitutional: Positive for fatigue.  HENT: Positive for mouth dryness and nose dryness. Negative for mouth sores.   Eyes: Positive for visual disturbance and dryness. Negative for pain and itching.  Respiratory:  Positive for shortness of breath. Negative for cough, hemoptysis and difficulty breathing.   Cardiovascular: Negative for chest pain, palpitations and swelling in legs/feet.  Gastrointestinal: Negative for abdominal pain, blood in stool, constipation and diarrhea.  Endocrine: Negative for increased urination.  Genitourinary: Negative for painful urination.  Musculoskeletal: Positive for arthralgias, joint pain, joint swelling and morning stiffness. Negative for myalgias, muscle weakness, muscle tenderness and myalgias.  Skin: Negative for color change, rash and redness.  Allergic/Immunologic: Negative for susceptible to infections.  Neurological: Positive for dizziness, numbness, headaches and weakness. Negative for memory loss.  Hematological: Negative for swollen glands.  Psychiatric/Behavioral: Positive for sleep disturbance. Negative for confusion.    PMFS History:  Patient Active Problem List   Diagnosis Date Noted  . Other fatigue 09/04/2017  . Shortness of breath on exertion 09/04/2017  . Essential hypertension 09/04/2017  . Controlled type 2 diabetes mellitus without complication, without long-term current use of insulin ([place]) 05/31/2017  . Chronic diastolic CHF (congestive heart failure) ([place]) 04/24/2017  . CAD (coronary artery disease) 04/24/2017  . Screening-pulmonary TB 06/14/2016  . History of BCG vaccination 06/14/2016  . Fibroids 07/27/2014  . Migraine 04/19/2014  . Nail fungus 04/09/2014  . Elevated serum protein level 04/09/2014  . Preventative health care 04/09/2014  . Iron deficiency anemia 09/02/2013  . Headache(784.0) 03/04/2013  . Obesity (BMI 30-39.9) 10/17/2012  . Palpitations 10/01/2012  . Glaucoma 11/07/2011  . Rheumatoid arthritis ([place]) 11/07/2011  . Borderline diabetes 11/07/2011  . HTN (hypertension) 11/05/2011  . Sjogren's disease ([place]) 11/05/2011  . Anemia  09/22/2010  . Fibroid uterus 09/22/2010  . Neutropenia ([place]) 09/22/2010  . Dysmenorrhea  09/15/2010    Past Medical History:  Diagnosis Date  . Anemia   . Arthritis   . Blood transfusion without reported diagnosis   . Colon polyps   . Controlled type 2 diabetes mellitus without complication, without long-term current use of insulin ([place]) 05/31/2017  . Ectopic pregnancy    RIGHT salpingostomy  . Elective abortion   . GERD (gastroesophageal reflux disease)   . Glaucoma    both eyes  . Headache    migraines  . History of chicken pox   . Hyperlipemia   . Hypertension   . Rheumatoid arthritis(714.0) 11/07/2011  . SAB (spontaneous abortion)   . Shortness of breath   . Sjogren's disease ([place])   . Wheezing     Family History  Problem Relation Age of Onset  . Hypertension Mother        died from heart disease  . Heart disease Mother        deceased, unknown cause  . Stroke Mother   . Obesity Mother   . Diabetes Sister   . Hypertension Sister   . Esophageal cancer Sister   . Stomach cancer Sister   . Cancer Sister   . Diabetes Sister   . Rectal cancer Neg Hx   . Colon cancer Neg Hx    Past Surgical History:  Procedure Laterality Date  . ABDOMINAL SURGERY     bowel repair  . COLONOSCOPY    . DILATION AND CURETTAGE OF UTERUS     X 2  . MENISCUS REPAIR Right    MENISCUS TEAR REPAIR  . MYOMECTOMY N/A 07/27/2014   Procedure: ABDOMINAL MYOMECTOMY;  Surgeon: Waymon Amato, MD;  Location: [facility];  Service: Gynecology;  Laterality: N/A;  . PELVIC LAPAROSCOPY  1994   IN 1999 LAPAROSCOPY WITH INCIDENTAL ENTEROTOMY REQUIRING LAPAROTOMY  . 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  Had a sister up in [place] who passed away March 24, 2015 who she was close to.   Born [place]- she came to [place] as adult.  Age 54.          Immunization History   Administered Date(s) Administered  . Hepatitis A, Adult 11/01/2017, 12/03/2018  . IPV 11/01/2017  . Influenza Split 11/07/2011  . Influenza,inj,Quad PF,6+ Mos 10/17/2012, 01/15/2014, 08/31/2014, 09/30/2017, 09/10/2018, 09/21/2019  . Meningococcal Mcv4o 11/01/2017  . Moderna Sars-Covid-2 Vaccination 01/28/2019, 02/27/2019, 11/27/2019  . Pneumococcal Polysaccharide-23 09/10/2018  . Tdap 06/01/2011, 11/25/2012  . Typhoid Live 11/01/2017  . Zoster Recombinat (Shingrix) 10/04/2017, 12/04/2017     Objective: Vital Signs: BP 129/77 (BP Location: Left Arm, Patient Position: Sitting, Cuff Size: Normal)   Pulse 81   Ht 5\' 1"  (1.549 m)   Wt 214 lb (97.1 kg)   LMP 08/09/2016   BMI 40.43 kg/m    Physical Exam Vitals and nursing note reviewed.  Constitutional:      Appearance: She is well-developed and well-nourished.  HENT:     Head: Normocephalic and atraumatic.  Eyes:     Extraocular Movements: EOM normal.     Conjunctiva/sclera: Conjunctivae normal.  Cardiovascular:     Pulses:  Intact distal pulses.  Pulmonary:     Effort: Pulmonary effort is normal.  Abdominal:     Palpations: Abdomen is soft.  Musculoskeletal:     Cervical back: Normal range of motion.  Skin:    General: Skin is warm and dry.     Capillary Refill: Capillary refill takes less than 2 seconds.  Neurological:     Mental Status: She is alert and oriented to person, place, and time.  Psychiatric:        Mood and Affect: Mood and affect normal.        Behavior: Behavior normal.      Musculoskeletal Exam: C-spine has limited and painful range of motion with lateral rotation.  Shoulder joints have painful range of motion with tenderness palpation.  Tenderness palpation over the left elbow joint.  She has full flexion extension of both elbows.  Painful limited range of motion of both wrist joints.  She has tenderness and synovitis of both wrist joints.  Tenderness and synovitis of bilateral second MCP joints.   Tenderness of the right third and fifth MCP joints noted.  She has difficulty making a complete fist due to pain and stiffness.  She has slightly limited range of motion with discomfort in the left hip joint.  Knee joints have painful range of motion with warmth bilaterally.  Ankle joints have painful range of motion bilaterally.  Tenderness and swelling of both ankle joints noted.  Tenderness of all MTP joints noted.  CDAI Exam: CDAI Score: 19.8  Patient Global: 10 mm; Provider Global: 8 mm Swollen: 8 ; Tender: 24  Joint Exam 12/16/2019      Right  Left  Glenohumeral   Tender   Tender  Elbow      Tender  Wrist  Swollen Tender  Swollen Tender  MCP 2  Swollen Tender  Swollen Tender  MCP 3   Tender     MCP 5   Tender     PIP 5   Tender     Knee  Swollen Tender  Swollen Tender  Ankle  Swollen Tender  Swollen Tender  MTP 1   Tender   Tender  MTP 2   Tender   Tender  MTP 3   Tender   Tender  MTP 4   Tender   Tender  MTP 5   Tender   Tender     Investigation: No additional findings.  Imaging: MR Brain W Wo Contrast  Result Date: 12/01/2019 CLINICAL DATA:  Sudden onset left hearing loss with unrestricted hearing on the right. EXAM: MRI HEAD WITHOUT AND WITH CONTRAST TECHNIQUE: Multiplanar, multiecho pulse sequences of the brain and surrounding structures were obtained without and with intravenous contrast. CONTRAST:  69mL MULTIHANCE GADOBENATE DIMEGLUMINE 529 MG/ML IV SOLN COMPARISON:  Head CT April 15, 2014. FINDINGS: Brain: No acute infarction, hemorrhage, hydrocephalus, extra-axial collection or mass lesion. Scattered and confluent foci of T2 hyperintensity are seen within the white matter of the cerebral hemispheres, including anterior temporal lobes, nonspecific, more pronounced than expected for patient's age. No cerebellopontine angle mass or internal auditory canal lesion is demonstrated.Normal appearance of the 7th and 8th cranial nerves bilaterally. No focus of abnormal contrast  enhancement. Vascular: Normal flow voids. Skull and upper cervical spine: Mild diffuse decrease of the T1 signal within the visualized cervical spine may represent red marrow reconversion. Sinuses/Orbits: Mild mucosal thickening of the right maxillary sinus and bilateral ethmoid cells. Other: Diffuse increase of the T2 signal within the bilateral  parotid glands with a cluster of cystic appearing lesions measuring 1.2 cm in the right parotid gland (series 5, image 2; series 12, image 8). This is most likely related to patient's known Sjogren's syndrome. IMPRESSION: 1. No cerebellopontine angle mass or internal auditory canal lesion. 2. Scattered and confluent foci of T2 hyperintensity within the white matter of the cerebral hemispheres, including anterior temporal lobes, nonspecific, more pronounced than expected for patient's age. Findings may be related to advanced chronic microvascular ischemic changes, autoimmune disease, CADASIL, vasculitis, demyelination and other inflammatory/infectious processes. Electronically Signed   By: Pedro Earls M.D.   On: 12/01/2019 09:57    Recent Labs: Lab Results  Component Value Date   WBC 3.5 (L) 10/30/2019   HGB 12.9 10/30/2019   PLT 228 10/30/2019   NA 138 09/29/2019   K 4.0 09/29/2019   CL 102 09/29/2019   CO2 30 09/29/2019   GLUCOSE 110 (H) 09/29/2019   BUN 11 09/29/2019   CREATININE 0.73 09/29/2019   BILITOT 0.9 09/21/2019   ALKPHOS 62 09/10/2018   AST 18 09/21/2019   ALT 18 09/21/2019   PROT 9.1 (H) 09/21/2019   ALBUMIN 4.0 09/10/2018   CALCIUM 9.8 09/29/2019   GFRAA 113 03/24/2019   QFTBGOLD Negative 06/14/2016   QFTBGOLDPLUS Negative 10/27/2018    Speciality Comments: PLQ eye exam: 12/09/2018 normal. [facility].  Procedures:  No procedures performed Allergies: Patient has no known allergies.   Assessment / Plan:     Visit Diagnoses: Rheumatoid arthritis involving multiple sites with positive rheumatoid factor (HCC)  - Positive RF, negative anti-CCP, 14 3 3  eta positive, history of rheumatoid arthritis for the last 10 years.  She was under care of Dr. Graylon Gunning and [name]: Patient presents today with significant pain and inflammation in multiple joints as described above. She has been experiencing frequent and severe flares over the past several months. Her last office visit was on 03/31/2019 at which time she was consented to initiate methotrexate, but she did not start on MTX at that time due to being apprehensive of potential side effects.  She discontinued Plaquenil in July 2021, and she has been taking Aleve 2 capsules by mouth twice daily for pain relief.  She has history of aggressive rheumatoid arthritis and would likely benefit from Biologic therapy, but her treatment options are limited due to history of chronic diastolic heart failure and neutropenia.  She presents today to discuss starting on methotrexate.  Indications, contraindications, and potential side effects of methotrexate were discussed today in detail.  All questions were addressed and consent was updated.  We will update CBC and CMP today.  She will likely remain on low-dose methotrexate due to history of neutropenia.  She will start on methotrexate 4 tablets by mouth once weekly and if labs are stable in 2 weeks she will increase to 6 tablets by mouth once weekly.  She will require frequent lab monitoring due to history of neutropenia. She will take folic acid 1 mg by mouth daily.  Prescriptions for  Methotrexate and folic acid pending lab results.  She will follow-up in the office in 6-day weeks to assess to assess her response to oral methotrexate.  If she has persistent pain and inflammation we can discuss switching her to injectable methotrexate at that time.   Drug Counseling TB Gold: Negative on 10/27/2018. Hepatitis panel: Negative on 10/27/2018 Chest-xray: No acute cardiopulmonary disease on 11/01/17 Contraception: She is  postmenopausal. Alcohol use: Discussed the importance of  avoiding alcohol use. Patient was counseled on the purpose, proper use, and adverse effects of methotrexate including nausea, infection, and signs and symptoms of pneumonitis.  Reviewed instructions with patient to take methotrexate weekly along with folic acid daily.  Discussed the importance of frequent monitoring of kidney and liver function and blood counts, and provided patient with standing lab instructions.  Counseled patient to avoid NSAIDs and alcohol while on methotrexate.  Provided patient with educational materials on methotrexate and answered all questions.  Advised patient to get annual influenza vaccine and to get a pneumococcal vaccine if patient has not already had one.  Patient voiced understanding.  Patient consented to methotrexate use.  Will upload into chart.    High risk medication use -She will be starting on methotrexate 4 tablets by mouth once weekly for 2 weeks and if labs are stable she will increase to 6 tablets by mouth once weekly.  She will take folic acid 1 mg by mouth daily.  Discontinued Plaquenil in July 2021 due to inadequate response.  We will update CBC and CMP today.  She will return for lab work in 2 weeks x 2 then 2 months then every 3 months.  Standing orders for CBC and CMP remain in place.- Plan: CBC with Differential/Platelet, COMPLETE METABOLIC PANEL WITH GFR Discussed the importance of holding methotrexate if she develops signs or symptoms of an infection and to resume once the infection has completely cleared.  Sjogren's syndrome with other organ involvement ([place]) - Anti-Ro positive, anti-La positive: She has ongoing sicca symptoms.  She has noticed some improvement with the use of xiidra eye drops.  The use of OTC products was discussed.   Primary osteoarthritis of both knees: Chronic pain.  She has painful ROM of both knee joints. Warmth of both knee joints noted on exam.   Primary osteoarthritis of  both feet: She has painful ROM and joint tenderness over both ankle joints.  Warmth and swelling of both ankle joints noted.  She has tenderness of all MTP joints.  Discussed the importance of wearing proper fitting shoes.   Other medical conditions are listed as follows:  Family history of rheumatoid arthritis - Sister  Coronary artery disease involving native coronary artery of native heart without angina pectoris  Essential hypertension  Chronic diastolic CHF (congestive heart failure) ([place])  Controlled type 2 diabetes mellitus without complication, without long-term current use of insulin (HCC)  Hx of migraines  Palpitations  Other fatigue  Orders: Orders Placed This Encounter  Procedures  . CBC with Differential/Platelet  . COMPLETE METABOLIC PANEL WITH GFR   No orders of the defined types were placed in this encounter.     Follow-Up Instructions: Return for Rheumatoid arthritis, Sjogren's syndrome, Osteoarthritis.   Ofilia Neas, PA-C  Note - This record has been created using Dragon software.  Chart creation errors have been sought, but may not always  have been located. Such creation errors do not reflect on  the standard of medical care.

## 2019-12-16 ENCOUNTER — Other Ambulatory Visit: Payer: Self-pay

## 2019-12-16 ENCOUNTER — Encounter: Payer: Self-pay | Admitting: Physician Assistant

## 2019-12-16 ENCOUNTER — Ambulatory Visit (INDEPENDENT_AMBULATORY_CARE_PROVIDER_SITE_OTHER): Payer: 59 | Admitting: Physician Assistant

## 2019-12-16 VITALS — BP 129/77 | HR 81 | Ht 61.0 in | Wt 214.0 lb

## 2019-12-16 DIAGNOSIS — M3509 Sicca syndrome with other organ involvement: Secondary | ICD-10-CM | POA: Diagnosis not present

## 2019-12-16 DIAGNOSIS — Z79899 Other long term (current) drug therapy: Secondary | ICD-10-CM | POA: Diagnosis not present

## 2019-12-16 DIAGNOSIS — I2510 Atherosclerotic heart disease of native coronary artery without angina pectoris: Secondary | ICD-10-CM

## 2019-12-16 DIAGNOSIS — M0579 Rheumatoid arthritis with rheumatoid factor of multiple sites without organ or systems involvement: Secondary | ICD-10-CM

## 2019-12-16 DIAGNOSIS — Z8669 Personal history of other diseases of the nervous system and sense organs: Secondary | ICD-10-CM

## 2019-12-16 DIAGNOSIS — M19072 Primary osteoarthritis, left ankle and foot: Secondary | ICD-10-CM

## 2019-12-16 DIAGNOSIS — I5032 Chronic diastolic (congestive) heart failure: Secondary | ICD-10-CM

## 2019-12-16 DIAGNOSIS — R002 Palpitations: Secondary | ICD-10-CM

## 2019-12-16 DIAGNOSIS — E119 Type 2 diabetes mellitus without complications: Secondary | ICD-10-CM

## 2019-12-16 DIAGNOSIS — M19071 Primary osteoarthritis, right ankle and foot: Secondary | ICD-10-CM

## 2019-12-16 DIAGNOSIS — M170 Bilateral primary osteoarthritis of knee: Secondary | ICD-10-CM

## 2019-12-16 DIAGNOSIS — I10 Essential (primary) hypertension: Secondary | ICD-10-CM

## 2019-12-16 DIAGNOSIS — R5383 Other fatigue: Secondary | ICD-10-CM

## 2019-12-16 DIAGNOSIS — Z8261 Family history of arthritis: Secondary | ICD-10-CM

## 2019-12-16 NOTE — Patient Instructions (Addendum)
Standing Labs We placed an order today for your standing lab work.   Please have your standing labs drawn in 2 weeks x2, 2 months, and every 3 months   If possible, please have your labs drawn 2 weeks prior to your appointment so that the provider can discuss your results at your appointment.  We have open lab daily Monday through Thursday from 8:30-12:30 PM and 1:30-4:30 PM and Friday from 8:30-12:30 PM and 1:30-4:00 PM at the office of Dr. Bo Merino, [facility].   Please be advised, patients with office appointments requiring lab work will take precedents over walk-in lab work.  If possible, please come for your lab work on Monday and Friday afternoons, as you may experience shorter wait times. The office is located at [address] [place], [place], [place] [address] No appointment is necessary.   Labs are drawn by [facility]. Please bring your co-pay at the time of your lab draw.  You may receive a bill from [facility] for your lab work.  If you wish to have your labs drawn at another location, please call the office 24 hours in advance to send orders.  If you have any questions regarding directions or hours of operation,  please call [phone].   As a reminder, please drink plenty of water prior to coming for your lab work. Thanks!    Methotrexate tablets What is this medicine? METHOTREXATE (METH oh TREX ate) is a chemotherapy drug used to treat cancer including breast cancer, leukemia, and lymphoma. This medicine can also be used to treat psoriasis and certain kinds of arthritis. This medicine may be used for other purposes; ask your health care provider or pharmacist if you have questions. COMMON BRAND NAME(S): Rheumatrex, Trexall What should I tell my health care provider before I take this medicine? They need to know if you have any of these conditions:  fluid in the stomach area or lungs  if you often drink alcohol  infection or immune system  problems  kidney disease or on hemodialysis  liver disease  low blood counts, like low white cell, platelet, or red cell counts  lung disease  radiation therapy  stomach ulcers  ulcerative colitis  an unusual or allergic reaction to methotrexate, other medicines, foods, dyes, or preservatives  pregnant or trying to get pregnant  breast-feeding How should I use this medicine? Take this medicine by mouth with a glass of water. Follow the directions on the prescription label. Take your medicine at regular intervals. Do not take it more often than directed. Do not stop taking except on your doctor's advice. Make sure you know why you are taking this medicine and how often you should take it. If this medicine is used for a condition that is not cancer, like arthritis or psoriasis, it should be taken weekly, NOT daily. Taking this medicine more often than directed can cause serious side effects, even death. Talk to your healthcare provider about safe handling and disposal of this medicine. You may need to take special precautions. Talk to your pediatrician regarding the use of this medicine in children. While this drug may be prescribed for selected conditions, precautions do apply. Overdosage: If you think you have taken too much of this medicine contact a poison control center or emergency room at once. NOTE: This medicine is only for you. Do not share this medicine with others. What if I miss a dose? If you miss a dose, talk with your doctor or health care professional.  Do not take double or extra doses. What may interact with this medicine? This medicine may interact with the following medication:  acitretin  aspirin and aspirin-like medicines including salicylates  azathioprine  certain antibiotics like penicillins, tetracycline, and chloramphenicol  cyclosporine  gold  hydroxychloroquine  live virus vaccines  NSAIDs, medicines for pain and inflammation, like ibuprofen  or naproxen  other cytotoxic agents  penicillamine  phenylbutazone  phenytoin  probenecid  retinoids such as isotretinoin and tretinoin  steroid medicines like prednisone or cortisone  sulfonamides like sulfasalazine and trimethoprim/sulfamethoxazole  theophylline This list may not describe all possible interactions. Give your health care provider a list of all the medicines, herbs, non-prescription drugs, or dietary supplements you use. Also tell them if you smoke, drink alcohol, or use illegal drugs. Some items may interact with your medicine. What should I watch for while using this medicine? Avoid alcoholic drinks. This medicine can make you more sensitive to the sun. Keep out of the sun. If you cannot avoid being in the sun, wear protective clothing and use sunscreen. Do not use sun lamps or tanning beds/booths. You may need blood work done while you are taking this medicine. Call your doctor or health care professional for advice if you get a fever, chills or sore throat, or other symptoms of a cold or flu. Do not treat yourself. This drug decreases your body's ability to fight infections. Try to avoid being around people who are sick. This medicine may increase your risk to bruise or bleed. Call your doctor or health care professional if you notice any unusual bleeding. Check with your doctor or health care professional if you get an attack of severe diarrhea, nausea and vomiting, or if you sweat a lot. The loss of too much body fluid can make it dangerous for you to take this medicine. Talk to your doctor about your risk of cancer. You may be more at risk for certain types of cancers if you take this medicine. Both men and women must use effective birth control with this medicine. Do not become pregnant while taking this medicine or until at least 1 normal menstrual cycle has occurred after stopping it. Women should inform their doctor if they wish to become pregnant or think they  might be pregnant. Men should not father a child while taking this medicine and for 3 months after stopping it. There is a potential for serious side effects to an unborn child. Talk to your health care professional or pharmacist for more information. Do not breast-feed an infant while taking this medicine. What side effects may I notice from receiving this medicine? Side effects that you should report to your doctor or health care professional as soon as possible:  allergic reactions like skin rash, itching or hives, swelling of the face, lips, or tongue  breathing problems or shortness of breath  diarrhea  dry, nonproductive cough  low blood counts - this medicine may decrease the number of white blood cells, red blood cells and platelets. You may be at increased risk for infections and bleeding.  mouth sores  redness, blistering, peeling or loosening of the skin, including inside the mouth  signs of infection - fever or chills, cough, sore throat, pain or trouble passing urine  signs and symptoms of bleeding such as bloody or black, tarry stools; red or dark-brown urine; spitting up blood or brown material that looks like coffee grounds; red spots on the skin; unusual bruising or bleeding from the eye, gums,  or nose  signs and symptoms of kidney injury like trouble passing urine or change in the amount of urine  signs and symptoms of liver injury like dark yellow or brown urine; general ill feeling or flu-like symptoms; light-colored stools; loss of appetite; nausea; right upper belly pain; unusually weak or tired; yellowing of the eyes or skin Side effects that usually do not require medical attention (report to your doctor or health care professional if they continue or are bothersome):  dizziness  hair loss  tiredness  upset stomach  vomiting This list may not describe all possible side effects. Call your doctor for medical advice about side effects. You may report side  effects to FDA at 1-800-FDA-1088. Where should I keep my medicine? Keep out of the reach of children. Store at room temperature between 20 and 25 degrees C (68 and 77 degrees F). Protect from light. Throw away any unused medicine after the expiration date. NOTE: This sheet is a summary. It may not cover all possible information. If you have questions about this medicine, talk to your doctor, pharmacist, or health care provider.  2020 Elsevier/Gold Standard (2016-08-09 13:38:43)

## 2019-12-17 ENCOUNTER — Other Ambulatory Visit: Payer: Self-pay | Admitting: *Deleted

## 2019-12-17 LAB — COMPLETE METABOLIC PANEL WITH GFR
AG Ratio: 0.9 (calc) — ABNORMAL LOW (ref 1.0–2.5)
ALT: 21 U/L (ref 6–29)
AST: 20 U/L (ref 10–35)
Albumin: 4 g/dL (ref 3.6–5.1)
Alkaline phosphatase (APISO): 54 U/L (ref 37–153)
BUN: 12 mg/dL (ref 7–25)
CO2: 30 mmol/L (ref 20–32)
Calcium: 9.7 mg/dL (ref 8.6–10.4)
Chloride: 105 mmol/L (ref 98–110)
Creat: 0.68 mg/dL (ref 0.50–1.05)
GFR, Est African American: 112 mL/min/{1.73_m2} (ref >=60)
GFR, Est Non African American: 96 mL/min/{1.73_m2} (ref >=60)
Globulin: 4.7 g/dL (calc) — ABNORMAL HIGH (ref 1.9–3.7)
Glucose, Bld: 85 mg/dL (ref 65–99)
Potassium: 4.6 mmol/L (ref 3.5–5.3)
Sodium: 138 mmol/L (ref 135–146)
Total Bilirubin: 0.5 mg/dL (ref 0.2–1.2)
Total Protein: 8.7 g/dL — ABNORMAL HIGH (ref 6.1–8.1)

## 2019-12-17 LAB — CBC WITH DIFFERENTIAL/PLATELET
Absolute Monocytes: 284 cells/uL (ref 200–950)
Basophils Absolute: 21 cells/uL (ref 0–200)
Basophils Relative: 0.6 %
Eosinophils Absolute: 112 cells/uL (ref 15–500)
Eosinophils Relative: 3.2 %
HCT: 39.4 % (ref 35.0–45.0)
Hemoglobin: 13 g/dL (ref 11.7–15.5)
Lymphs Abs: 2212 cells/uL (ref 850–3900)
MCH: 28.1 pg (ref 27.0–33.0)
MCHC: 33 g/dL (ref 32.0–36.0)
MCV: 85.1 fL (ref 80.0–100.0)
MPV: 10.4 fL (ref 7.5–12.5)
Monocytes Relative: 8.1 %
Neutro Abs: 872 cells/uL — ABNORMAL LOW (ref 1500–7800)
Neutrophils Relative %: 24.9 %
Platelets: 275 10*3/uL (ref 140–400)
RBC: 4.63 10*6/uL (ref 3.80–5.10)
RDW: 12.5 % (ref 11.0–15.0)
Total Lymphocyte: 63.2 %
WBC: 3.5 10*3/uL — ABNORMAL LOW (ref 3.8–10.8)

## 2019-12-17 MED ORDER — FOLIC ACID 1 MG PO TABS: 1.0000 mg | ORAL_TABLET | Freq: Every day | ORAL | 0 refills | Status: DC

## 2019-12-17 MED ORDER — METHOTREXATE 2.5 MG PO TABS: ORAL_TABLET | ORAL | 0 refills | Status: DC

## 2019-12-17 NOTE — Telephone Encounter (Signed)
Last Visit: 12/16/2019 Next Visit: 01/27/2020 Labs: 12/16/2019 WBC 3.5, Neutro Abs 872, Total Protein 8.7, Globulin 4.7, AG Ratio 0.9  Current Dose per office note 12/16/2019: methotrexate 4 tablets by mouth once weekly and if labs are stable in 2 weeks she will increase to 6 tablets by mouth once weekly.  DX: Rheumatoid arthritis involving multiple sites with positive rheumatoid factor   Okay to refill Methotrexate and Folic Acid?

## 2019-12-17 NOTE — Progress Notes (Signed)
WBC count is low but stable. She has long standing history of neutropenia.  We will need to monitor her lab work very closely.    Ok to start on methotrexate 4 tablets a week.  She will need updated lab work in 2 weeks to reassess CBC and CMP prior to increasing to 6 tablets.  It may be difficult to control her disease on low dose MTX.  She may be a candidate for orencia in the future if we have to discontinue MTX.  She will require TB gold prior to starting on any biologic agents in the future.   Total protein is elevated. Globulin is elevated. Please place future order for SPEP and immunoglobulins to be drawn with upcoming lab work in 2 weeks.

## 2019-12-18 ENCOUNTER — Telehealth: Payer: Self-pay | Admitting: *Deleted

## 2019-12-18 DIAGNOSIS — M3509 Sicca syndrome with other organ involvement: Secondary | ICD-10-CM

## 2019-12-18 DIAGNOSIS — M0579 Rheumatoid arthritis with rheumatoid factor of multiple sites without organ or systems involvement: Secondary | ICD-10-CM

## 2019-12-18 NOTE — Telephone Encounter (Signed)
-----   Message from Ofilia Neas, PA-C sent at 12/17/2019  4:42 PM EST ----- WBC count is low but stable. She has long standing history of neutropenia.  We will need to monitor her lab work very closely.    Ok to start on methotrexate 4 tablets a week.  She will need updated lab work in 2 weeks to reassess CBC and CMP prior to increasing to 6 tablets.  It may be difficult to control her disease on low dose MTX.  She may be a candidate for orencia in the future if we have to discontinue MTX.  She will require TB gold prior to starting on any biologic agents in the future.   Total protein is elevated. Globulin is elevated. Please place future order for SPEP and immunoglobulins to be drawn with upcoming lab work in 2 weeks.

## 2019-12-29 ENCOUNTER — Ambulatory Visit: Payer: PRIVATE HEALTH INSURANCE | Admitting: Family

## 2019-12-29 ENCOUNTER — Other Ambulatory Visit: Payer: Self-pay

## 2019-12-29 DIAGNOSIS — M0579 Rheumatoid arthritis with rheumatoid factor of multiple sites without organ or systems involvement: Secondary | ICD-10-CM

## 2019-12-29 DIAGNOSIS — M3509 Sicca syndrome with other organ involvement: Secondary | ICD-10-CM

## 2019-12-29 DIAGNOSIS — Z79899 Other long term (current) drug therapy: Secondary | ICD-10-CM

## 2019-12-31 LAB — COMPLETE METABOLIC PANEL WITH GFR
AG Ratio: 0.8 (calc) — ABNORMAL LOW (ref 1.0–2.5)
ALT: 21 U/L (ref 6–29)
AST: 24 U/L (ref 10–35)
Albumin: 3.9 g/dL (ref 3.6–5.1)
Alkaline phosphatase (APISO): 56 U/L (ref 37–153)
BUN: 10 mg/dL (ref 7–25)
CO2: 25 mmol/L (ref 20–32)
Calcium: 9.5 mg/dL (ref 8.6–10.4)
Chloride: 107 mmol/L (ref 98–110)
Creat: 0.63 mg/dL (ref 0.50–1.05)
GFR, Est African American: 115 mL/min/{1.73_m2} (ref >=60)
GFR, Est Non African American: 99 mL/min/{1.73_m2} (ref >=60)
Globulin: 4.8 g/dL (calc) — ABNORMAL HIGH (ref 1.9–3.7)
Glucose, Bld: 116 mg/dL — ABNORMAL HIGH (ref 65–99)
Potassium: 4 mmol/L (ref 3.5–5.3)
Sodium: 139 mmol/L (ref 135–146)
Total Bilirubin: 0.7 mg/dL (ref 0.2–1.2)
Total Protein: 8.7 g/dL — ABNORMAL HIGH (ref 6.1–8.1)

## 2019-12-31 LAB — IGG, IGA, IGM
IgG (Immunoglobin G), Serum: 3369 mg/dL — ABNORMAL HIGH (ref 600–1640)
IgM, Serum: 59 mg/dL (ref 50–300)
Immunoglobulin A: 598 mg/dL — ABNORMAL HIGH (ref 47–310)

## 2019-12-31 LAB — CBC WITH DIFFERENTIAL/PLATELET
Absolute Monocytes: 177 cells/uL — ABNORMAL LOW (ref 200–950)
Basophils Absolute: 9 cells/uL (ref 0–200)
Basophils Relative: 0.3 %
Eosinophils Absolute: 110 cells/uL (ref 15–500)
Eosinophils Relative: 3.8 %
HCT: 40 % (ref 35.0–45.0)
Hemoglobin: 13.3 g/dL (ref 11.7–15.5)
Lymphs Abs: 1752 cells/uL (ref 850–3900)
MCH: 28.4 pg (ref 27.0–33.0)
MCHC: 33.3 g/dL (ref 32.0–36.0)
MCV: 85.3 fL (ref 80.0–100.0)
MPV: 11.1 fL (ref 7.5–12.5)
Monocytes Relative: 6.1 %
Neutro Abs: 853 cells/uL — ABNORMAL LOW (ref 1500–7800)
Neutrophils Relative %: 29.4 %
Platelets: 214 10*3/uL (ref 140–400)
RBC: 4.69 10*6/uL (ref 3.80–5.10)
RDW: 12.8 % (ref 11.0–15.0)
Total Lymphocyte: 60.4 %
WBC: 2.9 10*3/uL — ABNORMAL LOW (ref 3.8–10.8)

## 2019-12-31 LAB — PROTEIN ELECTROPHORESIS, SERUM, WITH REFLEX
Albumin ELP: 3.8 g/dL (ref 3.8–4.8)
Alpha 1: 0.3 g/dL (ref 0.2–0.3)
Alpha 2: 0.7 g/dL (ref 0.5–0.9)
Beta 2: 0.5 g/dL (ref 0.2–0.5)
Beta Globulin: 0.4 g/dL (ref 0.4–0.6)
Gamma Globulin: 3.1 g/dL — ABNORMAL HIGH (ref 0.8–1.7)
Total Protein: 8.8 g/dL — ABNORMAL HIGH (ref 6.1–8.1)

## 2020-01-04 ENCOUNTER — Telehealth: Payer: Self-pay | Admitting: Pharmacy Technician

## 2020-01-04 ENCOUNTER — Other Ambulatory Visit: Payer: Self-pay

## 2020-01-04 ENCOUNTER — Other Ambulatory Visit: Payer: Self-pay | Admitting: Rheumatology

## 2020-01-04 DIAGNOSIS — Z79899 Other long term (current) drug therapy: Secondary | ICD-10-CM

## 2020-01-04 NOTE — Progress Notes (Signed)
I called patient and discussed lab results with her.  She took a second dose of methotrexate 4 tablets p.o. weekly yesterday.  A drop in white cell count is noted.  She has not noticed much improvement in her joint swelling.  She has history of congestive heart failure.  She cannot take any anti-TNF's.  I have advised her to discontinue methotrexate.  We will check labs enthesis CBC, TB Gold) in 2 weeks after stopping methotrexate.  We will apply for subcutaneous Orencia.  We plan to start on Orencia at the follow-up visit.

## 2020-01-04 NOTE — Telephone Encounter (Signed)
Submitted a Prior Authorization request to [employer] for ORENCIA SQ via Cover My Meds. Will update once we receive a response.   KeyOthella Boyer - PA Case ID: [id number]

## 2020-01-04 NOTE — Telephone Encounter (Signed)
Received notification from [facility] regarding a prior authorization for [facility] [employer]. Authorization has been APPROVED from 01/02/20 to 01/03/21.   Authorization # [id number]  Per plan, patient must fill through [facility]. Patient has commercial plan and can sign up for Orencia copay card.

## 2020-01-05 ENCOUNTER — Encounter: Payer: Self-pay | Admitting: Family

## 2020-01-05 ENCOUNTER — Ambulatory Visit (INDEPENDENT_AMBULATORY_CARE_PROVIDER_SITE_OTHER): Payer: 59 | Admitting: Family

## 2020-01-05 VITALS — BP 157/79 | HR 65 | Temp 98.3°F | Resp 16 | Ht 61.0 in | Wt 217.0 lb

## 2020-01-05 DIAGNOSIS — E278 Other specified disorders of adrenal gland: Secondary | ICD-10-CM | POA: Diagnosis not present

## 2020-01-05 DIAGNOSIS — I5032 Chronic diastolic (congestive) heart failure: Secondary | ICD-10-CM

## 2020-01-05 DIAGNOSIS — E1142 Type 2 diabetes mellitus with diabetic polyneuropathy: Secondary | ICD-10-CM

## 2020-01-05 DIAGNOSIS — R9082 White matter disease, unspecified: Secondary | ICD-10-CM

## 2020-01-05 DIAGNOSIS — D709 Neutropenia, unspecified: Secondary | ICD-10-CM

## 2020-01-05 DIAGNOSIS — M0579 Rheumatoid arthritis with rheumatoid factor of multiple sites without organ or systems involvement: Secondary | ICD-10-CM

## 2020-01-05 DIAGNOSIS — Z6838 Body mass index (BMI) 38.0-38.9, adult: Secondary | ICD-10-CM | POA: Diagnosis not present

## 2020-01-05 HISTORY — DX: Type 2 diabetes mellitus with diabetic polyneuropathy: E11.42

## 2020-01-05 LAB — HEMOGLOBIN A1C: Hgb A1c MFr Bld: 6.2 % (ref 4.6–6.5)

## 2020-01-05 MED ORDER — AMLODIPINE BESYLATE 5 MG PO TABS: 5.0000 mg | ORAL_TABLET | Freq: Every day | ORAL | 3 refills | Status: DC

## 2020-01-05 NOTE — Patient Instructions (Addendum)
Please add amlodipine 5mg  once daily.  Please complete lab work prior to leaving.

## 2020-01-05 NOTE — Progress Notes (Signed)
Subjective:    Patient ID: Amber [name], female    DOB: 1960-09-01, 60 y.o.   MRN: [id number]  HPI   Patient is a 60 yr old female who presents today for follow up.    HTN- current BP medications include:  toprol xl 100mg  HCTZ 25mg  Lisinopril 40mg .  She reports good compliance with her medications. BP Readings from Last 3 Encounters:  01/05/20 (!) 157/79  12/16/19 129/77  10/30/19 (!) 163/88   Obesity- last visit we made a referral to nutrition.  She did not find this referral to be very helpful. Wt Readings from Last 3 Encounters:  01/05/20 217 lb (98.4 kg)  12/16/19 214 lb (97.1 kg)  10/30/19 213 lb 14.4 oz (97 kg)   DM2- Does not check sugars at home Lab Results  Component Value Date   HGBA1C 6.0 (H) 09/21/2019   HGBA1C 6.3 09/10/2018   HGBA1C 6.0 (H) 09/04/2017   Lab Results  Component Value Date   MICROALBUR 3.20 (H) 11/07/2011   LDLCALC 73 09/21/2019   CREATININE 0.63 12/29/2019   Chronic diastolic heart failure-  She reports chronic LE edema.  She uses Lasix 20 mg daily as needed.  Reports mood is up and down.  Attributes to stress.    Iron deficiency anemia-she is maintained on iron 325. Lab Results  Component Value Date   WBC 2.9 (L) 12/29/2019   HGB 13.3 12/29/2019   HCT 40.0 12/29/2019   MCV 85.3 12/29/2019   PLT 214 12/29/2019   RA/Sjogren's- she is following regularly with [name]. She reports that she did not tolerate methotrexate.  She missed several days of work due to side effects from the methotrexate.  She is requesting a note for her employer.  Saw ENT recently- had MRI due to hearing loss.  MRI noted the following:  Scattered and confluent foci of T2 hyperintensity within the white matter of the cerebral hemispheres, including anterior temporal lobes, nonspecific, more pronounced than expected for patient's age. Findings may be related to advanced chronic microvascular ischemic changes, autoimmune disease,  CADASIL, vasculitis, demyelination and other inflammatory/infectious processes.   Review of Systems See HPI  Past Medical History:  Diagnosis Date  . Anemia   . Arthritis   . Blood transfusion without reported diagnosis   . Colon polyps   . Controlled type 2 diabetes mellitus without complication, without long-term current use of insulin ([place]) 05/31/2017  . Ectopic pregnancy    RIGHT salpingostomy  . Elective abortion   . GERD (gastroesophageal reflux disease)   . Glaucoma    both eyes  . Headache    migraines  . History of chicken pox   . Hyperlipemia   . Hypertension   . Rheumatoid arthritis(714.0) 11/07/2011  . SAB (spontaneous abortion)   . Shortness of breath   . Sjogren's disease ([place])   . Wheezing      Social History   Socioeconomic History  . Marital status: Single    Spouse name: Not on file  . Number of children: 0  . Years of education: Not on file  . Highest education level: Not on file  Occupational History  . Occupation: LPN    Employer: [employer]  Tobacco Use  . Smoking status: Never Smoker  . Smokeless tobacco: Never Used  Vaping Use  . Vaping Use: Never used  Substance and Sexual Activity  . Alcohol use: No    Alcohol/week: 0.0 standard drinks  . Drug use: No  . Sexual  activity: Yes    Birth control/protection: None    Comment: 1st intercourse- 89, partners- 5  Other Topics Concern  . 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  Had a sister up in [place] who passed away April 21, 2015 who she was close to.   Born in [place]- she came to [place] as adult.  Age 17.          Social Determinants of Health   Financial Resource Strain: Not on file  Food Insecurity: Not on file  Transportation Needs: Not on file  Physical Activity: Not on file  Stress: Not on file   Social Connections: Not on file  Intimate Partner Violence: Not on file    Past Surgical History:  Procedure Laterality Date  . ABDOMINAL SURGERY     bowel repair  . COLONOSCOPY    . DILATION AND CURETTAGE OF UTERUS     X 2  . MENISCUS REPAIR Right    MENISCUS TEAR REPAIR  . MYOMECTOMY N/A 07/27/2014   Procedure: ABDOMINAL MYOMECTOMY;  Surgeon: Waymon Amato, MD;  Location: [facility];  Service: Gynecology;  Laterality: N/A;  . PELVIC LAPAROSCOPY  1994   IN 1999 LAPAROSCOPY WITH INCIDENTAL ENTEROTOMY REQUIRING LAPAROTOMY  . UTERINE FIBROID SURGERY     July 2016    Family History  Problem Relation Age of Onset  . Hypertension Mother        died from heart disease  . Heart disease Mother        deceased, unknown cause  . Stroke Mother   . Obesity Mother   . Diabetes Sister   . Hypertension Sister   . Esophageal cancer Sister   . Stomach cancer Sister   . Cancer Sister   . Diabetes Sister   . Rectal cancer Neg Hx   . Colon cancer Neg Hx     No Known Allergies  Current Outpatient Medications on File Prior to Visit  Medication Sig Dispense Refill  . albuterol (VENTOLIN HFA) 108 (90 Base) MCG/ACT inhaler Inhale 2 puffs into the lungs every 6 (six) hours as needed for wheezing or shortness of breath. 18 g 5  . aspirin EC 81 MG tablet Take 1 tablet (81 mg total) by mouth daily. 30 tablet   . atorvastatin (LIPITOR) 40 MG tablet Take 1 tablet (40 mg total) by mouth daily. 30 tablet 0  . calcium-vitamin D (OSCAL WITH D) 500-200 MG-UNIT TABS tablet Take by mouth.    . Cyanocobalamin (VITAMIN B-12) 5000 MCG LOZG Take 1 tablet by mouth 2 (two) times daily.    . ferrous sulfate 325 (65 FE) MG tablet Take 325 mg by mouth 3 (three) times daily with meals.    . Fluticasone-Salmeterol (ADVAIR) 250-50 MCG/DOSE AEPB Inhale 1 puff into the lungs 2 (two) times daily. 60 each 5  . folic acid (FOLVITE) 1 MG tablet Take 1 tablet (1 mg total) by mouth daily. 90 tablet 1  . folic acid (FOLVITE) 1 MG  tablet Take 1 tablet (1 mg total) by mouth daily. 90 tablet 0  . furosemide (LASIX) 20 MG tablet TAKE 1 TABLET BY MOUTH ONCE DAILY AS NEEDED FOR  FLUID 90 tablet 0  . gabapentin (NEURONTIN) 100 MG capsule Take 1 capsule (100 mg total)  by mouth at bedtime. 90 capsule 1  . guaiFENesin-codeine (ROBITUSSIN AC) 100-10 MG/5ML syrup Take 5 mLs by mouth 3 (three) times daily as needed for cough. 75 mL 0  . hydrochlorothiazide (HYDRODIURIL) 25 MG tablet Take 1 tablet (25 mg total) by mouth daily. 90 tablet 1  . Lifitegrast (XIIDRA) 5 % SOLN Place 1 drop into both eyes 2 times daily.    Marland Kitchen lisinopril (ZESTRIL) 40 MG tablet Take 1 tablet by mouth once daily 30 tablet 0  . methotrexate (RHEUMATREX) 2.5 MG tablet Take 4 tabs po weekly x 2 weeks. IF labs are stable increase to 6 tabs po weekly. Caution:Chemotherapy. Protect from light. 20 tablet 0  . metoprolol succinate (TOPROL-XL) 100 MG 24 hr tablet TAKE 1 TABLET BY MOUTH ONCE DAILY WITH OR IMMEDIATELY FOLLOWING A MEAL 90 tablet 0  . SF 5000 PLUS 1.1 % CREA dental cream As directed.    . SUMAtriptan (IMITREX) 50 MG tablet TAKE 1 TABLET BY MOUTH EVERY 2 HOURS AS NEEDED FOR  MIGRAINE  MAY  REPEAT  IN  2  HOURS  IF  HEADACHE  PERSISTS  OR  RECURS 9 tablet 5  . TRAVATAN Z 0.004 % SOLN ophthalmic solution Place 1 drop into both eyes at bedtime.     . Tuberculin-Allergy Syringes 27G X 1/2" 1 ML MISC Use to inject methotrexate weekly. 12 each 1   No current facility-administered medications on file prior to visit.    BP (!) 157/79 (BP Location: Right Arm, Patient Position: Sitting, Cuff Size: Large)   Pulse 65   Temp 98.3 F (36.8 C) (Oral)   Resp 16   Ht 5\' 1"  (1.549 m)   Wt 217 lb (98.4 kg)   LMP 08/09/2016   SpO2 100%   BMI 41.00 kg/m       Objective:   Physical Exam Constitutional:      Appearance: She is well-developed and well-nourished.  Neck:     Thyroid: No thyromegaly.  Cardiovascular:     Rate and Rhythm: Normal rate and regular rhythm.      Heart sounds: Normal heart sounds. No murmur heard.   Pulmonary:     Effort: Pulmonary effort is normal. No respiratory distress.     Breath sounds: Normal breath sounds. No wheezing.  Musculoskeletal:     Cervical back: Neck supple.     Right lower leg: 1+ Edema present.     Left lower leg: 1+ Edema present.  Skin:    General: Skin is warm and dry.  Neurological:     Mental Status: She is alert and oriented to person, place, and time.  Psychiatric:        Mood and Affect: Mood and affect normal.        Behavior: Behavior normal.        Thought Content: Thought content normal.        Judgment: Judgment normal.           Assessment & Plan:  Hypertension-uncontrolled.  We will add amlodipine 5 mg p.o. once daily to her regimen.  She is advised to let me know if this significantly worsens her lower extremity edema.  Rheumatoid arthritis/Sjogren's syndrome-this is being managed by rheumatology.  Iron deficiency anemia-she had normal hemoglobin hematocrit on 12/29/2019.  This was performed in rheumatology.  Obesity-reinforced importance of healthy eating.  White matter disease-some changes noted on MRI.  She is concerned about this.  I will refer her to neurology for further evaluation.  Chronic diastolic heart failure-appears euvolemic today continue Lasix as needed 20 mg.

## 2020-01-05 NOTE — Telephone Encounter (Signed)
Spoke to patient about Orencia approval through insurance and her eligibility for copay card which should cost no more than $5 per month.  She has f/u appointment scheduled on 01/27/20 with Dr. Corliss Skains . Stopped MTX on 12/29/19 d/t recent WBC drop. and she's getting labs next week (2 weeks after stopping MTX). Patient verbalized understanding.  Will send MyChart message about how to sign up for copay card. She can sign up for copay card at: [email or web address]

## 2020-01-13 ENCOUNTER — Telehealth: Payer: Self-pay | Admitting: *Deleted

## 2020-01-13 NOTE — Progress Notes (Signed)
Office Visit Note  Patient: Amber [name]             Date of Birth: October 17, 1960           MRN: [id number]             PCP: [name], NP Referring: [name], NP Visit Date: 01/27/2020 Occupation: @GUAROCC @  Subjective:  Pain in multiple joints.   History of Present Illness: Amber [name] is a 60 y.o. female with history of rheumatoid arthritis, Sjogren's and osteoarthritis.  She had minimal improvement on methotrexate but it had to be discontinued due to neutropenia.  She has history of chronic neutropenia.  She has been having increased pain and discomfort in multiple joints including her hands, knees, ankles and her feet.  She has been having swelling in her joints.  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  She has been having increased discomfort.  Activities of Daily Living:  Patient reports morning stiffness for 30-60 minutes.   Patient Reports nocturnal pain.  Difficulty dressing/grooming: Denies Difficulty climbing stairs: Reports Difficulty getting out of chair: Reports Difficulty using hands for taps, buttons, cutlery, and/or writing: Reports  Review of Systems  Constitutional: Positive for fatigue.  HENT: Positive for mouth dryness and nose dryness. Negative for mouth sores.   Eyes: Positive for visual disturbance and dryness. Negative for pain and itching.  Respiratory: Positive for shortness of breath. Negative for cough, hemoptysis and difficulty breathing.   Cardiovascular: Negative for chest pain, palpitations and swelling in legs/feet.  Gastrointestinal: Negative for abdominal pain, blood in stool, constipation and diarrhea.  Endocrine: Negative for increased urination.  Genitourinary: Negative for painful urination.  Musculoskeletal: Positive for arthralgias, joint pain, joint swelling, morning stiffness and muscle tenderness. Negative for myalgias, muscle weakness and myalgias.  Skin: Negative  for color change, rash and redness.  Allergic/Immunologic: Negative for susceptible to infections.  Neurological: Positive for numbness, headaches and weakness. Negative for dizziness and memory loss.  Hematological: Negative for swollen glands.  Psychiatric/Behavioral: Positive for sleep disturbance. Negative for confusion.    PMFS History:  Patient Active Problem List   Diagnosis Date Noted  . Diabetic peripheral neuropathy ([place]) 01/05/2020  . Class 2 severe obesity with serious comorbidity and body mass index (BMI) of 38.0 to 38.9 in adult, unspecified obesity type ([place]) 01/05/2020  . Adrenal mass, right ([place]) 01/05/2020  . Other fatigue 09/04/2017  . Shortness of breath on exertion 09/04/2017  . Essential hypertension 09/04/2017  . Controlled type 2 diabetes mellitus without complication, without long-term current use of insulin ([place]) 05/31/2017  . Chronic diastolic CHF (congestive heart failure) ([place]) 04/24/2017  . CAD (coronary artery disease) 04/24/2017  . Screening-pulmonary TB 06/14/2016  . History of BCG vaccination 06/14/2016  . Fibroids 07/27/2014  . Migraine 04/19/2014  . Nail fungus 04/09/2014  . Elevated serum protein level 04/09/2014  . Preventative health care 04/09/2014  . Iron deficiency anemia 09/02/2013  . Headache(784.0) 03/04/2013  . Obesity (BMI 30-39.9) 10/17/2012  . Palpitations 10/01/2012  . Glaucoma 11/07/2011  . Rheumatoid arthritis ([place]) 11/07/2011  . Borderline diabetes 11/07/2011  . HTN (hypertension) 11/05/2011  . Sjogren's disease ([place]) 11/05/2011  . Anemia 09/22/2010  . Fibroid uterus 09/22/2010  . Neutropenia ([place]) 09/22/2010  . Dysmenorrhea 09/15/2010    Past Medical History:  Diagnosis Date  . Anemia   . Arthritis   . Blood transfusion without reported diagnosis   . Colon polyps   .  Controlled type 2 diabetes mellitus without complication, without long-term current use of insulin ([place]) 05/31/2017  . Ectopic pregnancy    RIGHT  salpingostomy  . Elective abortion   . GERD (gastroesophageal reflux disease)   . Glaucoma    both eyes  . Headache    migraines  . History of chicken pox   . Hyperlipemia   . Hypertension   . Rheumatoid arthritis(714.0) 11/07/2011  . SAB (spontaneous abortion)   . Shortness of breath   . Sjogren's disease ([place])   . Wheezing     Family History  Problem Relation Age of Onset  . Hypertension Mother        died from heart disease  . Heart disease Mother        deceased, unknown cause  . Stroke Mother   . Obesity Mother   . Diabetes Sister   . Hypertension Sister   . Esophageal cancer Sister   . Stomach cancer Sister   . Cancer Sister   . Diabetes Sister   . Rectal cancer Neg Hx   . Colon cancer Neg Hx    Past Surgical History:  Procedure Laterality Date  . ABDOMINAL SURGERY     bowel repair  . COLONOSCOPY    . DILATION AND CURETTAGE OF UTERUS     X 2  . MENISCUS REPAIR Right    MENISCUS TEAR REPAIR  . MYOMECTOMY N/A 07/27/2014   Procedure: ABDOMINAL MYOMECTOMY;  Surgeon: Waymon Amato, MD;  Location: [facility];  Service: Gynecology;  Laterality: N/A;  . PELVIC LAPAROSCOPY  1994   IN 1999 LAPAROSCOPY WITH INCIDENTAL ENTEROTOMY REQUIRING LAPAROTOMY  . 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  Had a sister up in [place] who passed away 04-22-2015 who she was close to.   Born [place]- she came to [place] as adult.  Age 69.    Left-handed.         Immunization History  Administered Date(s) Administered  . Hepatitis A, Adult 11/01/2017, 12/03/2018  . IPV 11/01/2017  . Influenza Split 11/07/2011  . Influenza,inj,Quad PF,6+ Mos 10/17/2012, 01/15/2014, 08/31/2014, 09/30/2017, 09/10/2018, 09/21/2019  . Meningococcal Mcv4o 11/01/2017  . Moderna  Sars-Covid-2 Vaccination 01/28/2019, 02/27/2019, 11/27/2019  . Pneumococcal Polysaccharide-23 09/10/2018  . Tdap 06/01/2011, 11/25/2012  . Typhoid Live 11/01/2017  . Zoster Recombinat (Shingrix) 10/04/2017, 12/04/2017     Objective: Vital Signs: BP 119/76 (BP Location: Left Arm, Patient Position: Sitting, Cuff Size: Large)   Pulse 65   Ht 5\' 1"  (1.549 m)   Wt 213 lb 3.2 oz (96.7 kg)   LMP 08/09/2016   BMI 40.28 kg/m    Physical Exam Vitals and nursing note reviewed.  Constitutional:      Appearance: She is well-developed and well-nourished.  HENT:     Head: Normocephalic and atraumatic.  Eyes:     Extraocular Movements: EOM normal.     Conjunctiva/sclera: Conjunctivae normal.  Cardiovascular:     Rate and Rhythm: Normal rate and regular rhythm.     Pulses: Intact distal pulses.     Heart sounds: Normal heart sounds.  Pulmonary:     Effort: Pulmonary effort is normal.     Breath sounds: Normal  breath sounds.  Abdominal:     General: Bowel sounds are normal.     Palpations: Abdomen is soft.  Musculoskeletal:     Cervical back: Normal range of motion.  Lymphadenopathy:     Cervical: No cervical adenopathy.  Skin:    General: Skin is warm and dry.     Capillary Refill: Capillary refill takes less than 2 seconds.  Neurological:     Mental Status: She is alert and oriented to person, place, and time.  Psychiatric:        Mood and Affect: Mood and affect normal.        Behavior: Behavior normal.      Musculoskeletal Exam: C-spine was in good range of motion.  Shoulder joints, elbow joints, wrist joints with good range of motion.  She has discomfort and some swelling in her bilateral wrist joints.  She has synovitis over bilateral second and third MCP joints.  She had tenderness over PIP joints.  She has swelling in bilateral knee joints.  She had tenderness over right ankle joint.  She has tenderness across MTPs.  CDAI Exam: CDAI Score: 25.6  Patient Global: 8 mm;  Provider Global: 8 mm Swollen: 9 ; Tender: 27  Joint Exam 01/27/2020      Right  Left  Wrist  Swollen Tender  Swollen Tender  MCP 2  Swollen Tender  Swollen Tender  MCP 3  Swollen Tender  Swollen Tender  PIP 2   Tender   Tender  PIP 3   Tender   Tender  PIP 4   Tender   Tender  PIP 5   Tender   Tender  Knee  Swollen Tender  Swollen Tender  Ankle  Swollen Tender     MTP 1   Tender   Tender  MTP 2   Tender   Tender  MTP 3   Tender   Tender  MTP 4   Tender   Tender  MTP 5   Tender   Tender     Investigation: No additional findings.  Imaging: No results found.  Recent Labs: Lab Results  Component Value Date   WBC 3.3 (L) 01/20/2020   HGB 13.0 01/20/2020   PLT 214 01/20/2020   NA 139 12/29/2019   K 4.0 12/29/2019   CL 107 12/29/2019   CO2 25 12/29/2019   GLUCOSE 116 (H) 12/29/2019   BUN 10 12/29/2019   CREATININE 0.63 12/29/2019   BILITOT 0.7 12/29/2019   ALKPHOS 62 09/10/2018   AST 24 12/29/2019   ALT 21 12/29/2019   PROT 8.8 (H) 12/29/2019   PROT 8.7 (H) 12/29/2019   ALBUMIN 4.0 09/10/2018   CALCIUM 9.5 12/29/2019   GFRAA 115 12/29/2019   QFTBGOLD Negative 06/14/2016   QFTBGOLDPLUS NEGATIVE 01/20/2020    Speciality Comments: PLQ eye exam: 12/09/2018 normal. [facility].  Procedures:  No procedures performed Allergies: Patient has no known allergies.   Assessment / Plan:     Visit Diagnoses: Rheumatoid arthritis involving multiple sites with positive rheumatoid factor (HCC) - Positive RF, negative anti-CCP, 14 3 3  eta positive, history of rheumatoid arthritis for the last 10 years.  She was under care of Dr. Graylon Gunning and [name]: She has been having a flare with increased pain and discomfort in multiple joints.  She had some response to methotrexate but methotrexate had to be discontinued due to drop in her white cell count.  I detailed discussion regarding different treatment options and their side effects.  Indications side effects, contraindications  of were discussed.  She cannot use anti-TNF's due to history of CHF.  Amber [name] has been approved.  A handout was given and consent was taken.  My plan is to start her on Orencia subcu.  every other week.  Once labs are stable we can change it to every week.  I will check labs in 2 weeks, 1 month and then every 2 months to monitor for drug toxicity. Patient did not have time to start Gibbs first injection today. She will come tomorrow for the first injection of Orencia.  High risk medication use -methotrexate was discontinued due to neutropenia.  Sjogren's syndrome with other organ involvement ([place]) - Anti-Ro positive, anti-La positive: She continues to have sicca symptoms.  Primary osteoarthritis of both knees-she has discomfort in her bilateral knee joints and warmth on palpation today.  Primary osteoarthritis of both feet-she complains of pain and discomfort in her bilateral feet.  Family history of rheumatoid arthritis - Sister  Essential hypertension-her blood pressure is normal.  Coronary artery disease involving native coronary artery of native heart without angina pectoris  Controlled type 2 diabetes mellitus without complication, without long-term current use of insulin (HCC)  Chronic diastolic CHF (congestive heart failure) (HCC)  Hx of migraines  Other fatigue  Palpitations  Educated about COVID-19 virus infection-she is fully vaccinated against COVID-19.  She also received her third dose.  I advised her to get a booster 6 months after the third dose.  Holding Amber [name] was discussed.  Instructions were placed in the AVS.  Use of mask, social distancing and hand hygiene was discussed.  Orders: No orders of the defined types were placed in this encounter.  No orders of the defined types were placed in this encounter.   Follow-Up Instructions: Return in about 6 weeks (around 03/09/2020) for Rheumatoid arthritis, Sjogren's, Osteoarthritis.   Bo Merino, MD  Note -  This record has been created using Editor, commissioning.  Chart creation errors have been sought, but may not always  have been located. Such creation errors do not reflect on  the standard of medical care.

## 2020-01-13 NOTE — Telephone Encounter (Signed)
Spoke with patient who had been advised by [name] to have her labs performed on 01/18/2020. Patient is concerned with the inclement weather they are calling for and does not want to drive to the office  In that weather. Patient asked if she can come to the office on Friday to have her labs completed. Patient states she has taken 3 doses of Methotrexate. Patient advised she may come in on Friday to have the labs completed. Patient expressed understanding.

## 2020-01-20 ENCOUNTER — Other Ambulatory Visit: Payer: Self-pay

## 2020-01-20 DIAGNOSIS — Z79899 Other long term (current) drug therapy: Secondary | ICD-10-CM

## 2020-01-21 ENCOUNTER — Encounter: Payer: Self-pay | Admitting: Neurology

## 2020-01-21 ENCOUNTER — Telehealth: Payer: Self-pay | Admitting: Neurology

## 2020-01-21 ENCOUNTER — Ambulatory Visit: Payer: 59 | Admitting: Neurology

## 2020-01-21 VITALS — BP 152/93 | HR 80 | Ht 61.0 in | Wt 214.8 lb

## 2020-01-21 DIAGNOSIS — I679 Cerebrovascular disease, unspecified: Secondary | ICD-10-CM | POA: Diagnosis not present

## 2020-01-21 NOTE — Telephone Encounter (Signed)
This patient did not show for a new patient appointment today. 

## 2020-01-21 NOTE — Telephone Encounter (Signed)
[employer] order sent to GI. They will obtain the auth and reach out to the patient to schedule.  

## 2020-01-21 NOTE — Progress Notes (Signed)
Reason for visit: Cerebrovascular disease  Referring physician: Dr. Christean Grief is a 60 y.o. female  History of present illness:  Amber [name] is a 60 year old left-handed black female with a history of rheumatoid arthritis and Sjogren's syndrome.  She has a history of diabetes and borderline diabetes and dyslipidemia.  She also has a history of migraine headache.  She was seen and evaluated through ENT when she lost her hearing suddenly several months ago in the left ear.  She underwent MRI of the brain which showed evidence of at least a moderate level of small vessel ischemic changes but the pattern included the anterior tips of the temporal lobes and involvement of the white matter beneath the external capsule bilaterally.  The patient is on low-dose aspirin.  She has not had any history of overt stroke events, she denies any numbness or weakness of the face, arms, or legs.  She has not had any balance changes or difficulty controlling the bowels or the bladder.  She does report that her older sister died at age 28 with cancer but had a history of cerebrovascular disease prior to her death.  Her sister had diabetes and hypertension.  The patient does not smoke cigarettes.  Occasionally she may have some problems with dizziness but no syncope.  She does have a history of migraine headaches that occur on average once a week, and it been present for about 2 years.  She is sent to this office for further evaluation.  Past Medical History:  Diagnosis Date   Anemia    Arthritis    Blood transfusion without reported diagnosis    Colon polyps    Controlled type 2 diabetes mellitus without complication, without long-term current use of insulin ([place]) 05/31/2017   Ectopic pregnancy    RIGHT salpingostomy   Elective abortion    GERD (gastroesophageal reflux disease)    Glaucoma    both eyes   Headache    migraines   History of chicken pox    Hyperlipemia     Hypertension    Rheumatoid arthritis(714.0) 11/07/2011   SAB (spontaneous abortion)    Shortness of breath    Sjogren's disease ([place])    Wheezing     Past Surgical History:  Procedure Laterality Date   ABDOMINAL SURGERY     bowel repair   COLONOSCOPY     DILATION AND CURETTAGE OF UTERUS     X 2   MENISCUS REPAIR Right    MENISCUS TEAR REPAIR   MYOMECTOMY N/A 07/27/2014   Procedure: ABDOMINAL MYOMECTOMY;  Surgeon: Waymon Amato, MD;  Location: [facility];  Service: Gynecology;  Laterality: N/A;   PELVIC LAPAROSCOPY  1994   IN 1999 LAPAROSCOPY WITH INCIDENTAL ENTEROTOMY REQUIRING LAPAROTOMY   UTERINE FIBROID SURGERY     July 2016    Family History  Problem Relation Age of Onset   Hypertension Mother        died from heart disease   Heart disease Mother        deceased, unknown cause   Stroke Mother    Obesity Mother    Diabetes Sister    Hypertension Sister    Esophageal cancer Sister    Stomach cancer Sister    Cancer Sister    Diabetes Sister    Rectal cancer Neg Hx    Colon cancer Neg Hx     Social history:  reports that she has never smoked. She has never used smokeless tobacco.  She reports that she does not drink alcohol and does not use drugs.  Medications:  Prior to Admission medications   Medication Sig Start Date End Date Taking? Authorizing Provider  albuterol (VENTOLIN HFA) 108 (90 Base) MCG/ACT inhaler Inhale 2 puffs into the lungs every 6 (six) hours as needed for wheezing or shortness of breath. 10/12/19   [name], NP  amLODipine (NORVASC) 5 MG tablet Take 1 tablet (5 mg total) by mouth daily. 01/05/20   [name], NP  aspirin EC 81 MG tablet Take 1 tablet (81 mg total) by mouth daily. 09/02/19   [name], NP  atorvastatin (LIPITOR) 40 MG tablet Take 1 tablet (40 mg total) by mouth daily. 09/02/19 10/02/19  [name], NP  calcium-vitamin D (OSCAL WITH D) 500-200 MG-UNIT TABS tablet Take by mouth.     [provider]  Cyanocobalamin (VITAMIN B-12) 5000 MCG LOZG Take 1 tablet by mouth 2 (two) times daily. 11/04/15   [provider]  ferrous sulfate 325 (65 FE) MG tablet Take 325 mg by mouth 3 (three) times daily with meals.    [provider]  Fluticasone-Salmeterol (ADVAIR) 250-50 MCG/DOSE AEPB Inhale 1 puff into the lungs 2 (two) times daily. 09/10/18   [name], NP  folic acid (FOLVITE) 1 MG tablet Take 1 tablet (1 mg total) by mouth daily. 03/31/19   Bo Merino, MD  folic acid (FOLVITE) 1 MG tablet Take 1 tablet (1 mg total) by mouth daily. 12/17/19   Ofilia Neas, PA-C  furosemide (LASIX) 20 MG tablet TAKE 1 TABLET BY MOUTH ONCE DAILY AS NEEDED FOR  FLUID 09/02/19   [name], NP  gabapentin (NEURONTIN) 100 MG capsule Take 1 capsule (100 mg total) by mouth at bedtime. 10/22/19   [name], NP  guaiFENesin-codeine (ROBITUSSIN AC) 100-10 MG/5ML syrup Take 5 mLs by mouth 3 (three) times daily as needed for cough. 10/12/19   [name], NP  hydrochlorothiazide (HYDRODIURIL) 25 MG tablet Take 1 tablet (25 mg total) by mouth daily. 09/21/19   [name], NP  Lifitegrast Shirley Friar) 5 % SOLN Place 1 drop into both eyes 2 times daily. 12/09/18   [provider]  lisinopril (ZESTRIL) 40 MG tablet Take 1 tablet by mouth once daily 11/17/19   [name], NP  methotrexate (RHEUMATREX) 2.5 MG tablet Take 4 tabs po weekly x 2 weeks. IF labs are stable increase to 6 tabs po weekly. Caution:Chemotherapy. Protect from light. 12/17/19   Ofilia Neas, PA-C  metoprolol succinate (TOPROL-XL) 100 MG 24 hr tablet TAKE 1 TABLET BY MOUTH ONCE DAILY WITH OR IMMEDIATELY FOLLOWING A MEAL 11/17/19   [name], NP  SF 5000 PLUS 1.1 % CREA dental cream As directed. 08/29/16   [provider]  SUMAtriptan (IMITREX) 50 MG tablet TAKE 1 TABLET BY MOUTH EVERY 2 HOURS AS NEEDED FOR  MIGRAINE  MAY  REPEAT  IN  2  HOURS   IF  HEADACHE  PERSISTS  OR  RECURS 09/10/18   [name], NP  TRAVATAN Z 0.004 % SOLN ophthalmic solution Place 1 drop into both eyes at bedtime.  10/25/11   [provider]  Tuberculin-Allergy Syringes 27G X 1/2" 1 ML MISC Use to inject methotrexate weekly. 03/31/19   Bo Merino, MD     No Known Allergies  ROS:  Out of a complete 14 system review of symptoms, the patient complains only of the following symptoms, and all other reviewed systems are negative.  Headache Joint  pain Hearing loss  Blood pressure (!) 152/93, pulse 80, height 5\' 1"  (1.549 m), weight 214 lb 12.8 oz (97.4 kg), last menstrual period 08/09/2016.  Physical Exam  General: The patient is alert and cooperative at the time of the examination.  The patient is moderately to markedly obese.  Eyes: Pupils are equal, round, and reactive to light. Discs are flat bilaterally.  Neck: The neck is supple, no carotid bruits are noted.  Respiratory: The respiratory examination is clear.  Cardiovascular: The cardiovascular examination reveals a regular rate and rhythm, no obvious murmurs or rubs are noted.  Skin: Extremities are without significant edema.  Neurologic Exam  Mental status: The patient is alert and oriented x 3 at the time of the examination. The patient has apparent normal recent and remote memory, with an apparently normal attention span and concentration ability.  Cranial nerves: Facial symmetry is present. There is good sensation of the face to pinprick and soft touch bilaterally. The strength of the facial muscles and the muscles to head turning and shoulder shrug are normal bilaterally. Speech is well enunciated, no aphasia or dysarthria is noted. Extraocular movements are full. Visual fields are full. The tongue is midline, and the patient has symmetric elevation of the soft palate. No obvious hearing deficits are noted.  Motor: The motor testing reveals 5 over 5 strength of all 4  extremities. Good symmetric motor tone is noted throughout.  Sensory: Sensory testing is intact to pinprick, soft touch, vibration sensation, and position sense on all 4 extremities. No evidence of extinction is noted.  Coordination: Cerebellar testing reveals good finger-nose-finger and heel-to-shin bilaterally.  Gait and station: Gait is normal. Tandem gait is slightly unsteady. Romberg is negative. No drift is seen.  Reflexes: Deep tendon reflexes are symmetric and normal bilaterally. Toes are downgoing bilaterally.   MRI brain 12/01/19:  IMPRESSION: 1. No cerebellopontine angle mass or internal auditory canal lesion. 2. Scattered and confluent foci of T2 hyperintensity within the white matter of the cerebral hemispheres, including anterior temporal lobes, nonspecific, more pronounced than expected for patient's age. Findings may be related to advanced chronic microvascular ischemic changes, autoimmune disease, CADASIL, vasculitis, demyelination and other inflammatory/infectious processes.  * MRI scan images were reviewed online. I agree with the written report.    Assessment/Plan:  1.  Cerebrovascular disease  2.  Hypertension  3.  Borderline diabetes  4.  Dyslipidemia  5.  History of migraine headache  The patient does have multiple risk factors for small vessel disease.  Upon review the MRI of the brain however the patient does have features that could be consistent with CADASIL.  The patient reports that her sister also had cerebrovascular disease.  The patient has a history of migraine which would be consistent with CADASIL.  We will proceed with further work-up with blood work, we will get CT angiogram of the head and neck.  The patient will remain on aspirin.  I have asked her to get a blood pressure cuff and follow her blood pressures closely at home, if they are consistently over 130 her blood pressure needs to be more rigorously controlled.  Depending upon the  results of the above, we may consider genetic testing for a notch 3 mutation that would suggest CADASIL.  The patient currently does not report significant issues with memory.  Jill Alexanders MD 01/21/2020 2:44 PM  [facility] [address] [place], [place] [address]  Phone [phone] Fax [phone]

## 2020-01-22 LAB — QUANTIFERON-TB GOLD PLUS
Mitogen-NIL: >10 IU/mL
NIL: 0.03 IU/mL
QuantiFERON-TB Gold Plus: NEGATIVE
TB1-NIL: <0 IU/mL
TB2-NIL: <0 IU/mL

## 2020-01-22 LAB — CBC WITH DIFFERENTIAL/PLATELET
Absolute Monocytes: 251 cells/uL (ref 200–950)
Basophils Absolute: 10 cells/uL (ref 0–200)
Basophils Relative: 0.3 %
Eosinophils Absolute: 79 cells/uL (ref 15–500)
Eosinophils Relative: 2.4 %
HCT: 39.4 % (ref 35.0–45.0)
Hemoglobin: 13 g/dL (ref 11.7–15.5)
Lymphs Abs: 2000 cells/uL (ref 850–3900)
MCH: 28 pg (ref 27.0–33.0)
MCHC: 33 g/dL (ref 32.0–36.0)
MCV: 84.9 fL (ref 80.0–100.0)
MPV: 11.9 fL (ref 7.5–12.5)
Monocytes Relative: 7.6 %
Neutro Abs: 960 cells/uL — ABNORMAL LOW (ref 1500–7800)
Neutrophils Relative %: 29.1 %
Platelets: 214 10*3/uL (ref 140–400)
RBC: 4.64 10*6/uL (ref 3.80–5.10)
RDW: 13.1 % (ref 11.0–15.0)
Total Lymphocyte: 60.6 %
WBC: 3.3 10*3/uL — ABNORMAL LOW (ref 3.8–10.8)

## 2020-01-22 NOTE — Progress Notes (Signed)
TB Gold is negative.  White cell count is low and stable.  We will discuss starting on Orencia at the follow-up visit.

## 2020-01-27 ENCOUNTER — Encounter: Payer: Self-pay | Admitting: Rheumatology

## 2020-01-27 ENCOUNTER — Ambulatory Visit (INDEPENDENT_AMBULATORY_CARE_PROVIDER_SITE_OTHER): Payer: 59 | Admitting: Rheumatology

## 2020-01-27 ENCOUNTER — Other Ambulatory Visit: Payer: Self-pay

## 2020-01-27 VITALS — BP 119/76 | HR 65 | Ht 61.0 in | Wt 213.2 lb

## 2020-01-27 DIAGNOSIS — M170 Bilateral primary osteoarthritis of knee: Secondary | ICD-10-CM | POA: Diagnosis not present

## 2020-01-27 DIAGNOSIS — R002 Palpitations: Secondary | ICD-10-CM

## 2020-01-27 DIAGNOSIS — Z8669 Personal history of other diseases of the nervous system and sense organs: Secondary | ICD-10-CM

## 2020-01-27 DIAGNOSIS — M3509 Sicca syndrome with other organ involvement: Secondary | ICD-10-CM | POA: Diagnosis not present

## 2020-01-27 DIAGNOSIS — Z7189 Other specified counseling: Secondary | ICD-10-CM

## 2020-01-27 DIAGNOSIS — M0579 Rheumatoid arthritis with rheumatoid factor of multiple sites without organ or systems involvement: Secondary | ICD-10-CM

## 2020-01-27 DIAGNOSIS — Z8261 Family history of arthritis: Secondary | ICD-10-CM

## 2020-01-27 DIAGNOSIS — I10 Essential (primary) hypertension: Secondary | ICD-10-CM

## 2020-01-27 DIAGNOSIS — M19072 Primary osteoarthritis, left ankle and foot: Secondary | ICD-10-CM

## 2020-01-27 DIAGNOSIS — I5032 Chronic diastolic (congestive) heart failure: Secondary | ICD-10-CM

## 2020-01-27 DIAGNOSIS — I2510 Atherosclerotic heart disease of native coronary artery without angina pectoris: Secondary | ICD-10-CM

## 2020-01-27 DIAGNOSIS — E119 Type 2 diabetes mellitus without complications: Secondary | ICD-10-CM

## 2020-01-27 DIAGNOSIS — Z79899 Other long term (current) drug therapy: Secondary | ICD-10-CM

## 2020-01-27 DIAGNOSIS — R5383 Other fatigue: Secondary | ICD-10-CM

## 2020-01-27 DIAGNOSIS — M19071 Primary osteoarthritis, right ankle and foot: Secondary | ICD-10-CM

## 2020-01-27 NOTE — Progress Notes (Signed)
Pharmacy Note  Subjective:   Patient presents to clinic today to obtain consent before starting on Orencia.   Dose: Orencia 125 mg sq injections every 14 days.  Spacing the dose due to history of neutropenia.  Discontinued MTX due to worsening neutropenia. CBC updated on 01/20/20. She will require frequent lab monitoring as discussed below.   Patient running a fever or have signs/symptoms of infection? No  Patient currently on antibiotics for the treatment of infection? No  Patient have any upcoming invasive procedures/surgeries? No  Objective: CMP     Component Value Date/Time   NA 139 12/29/2019 0000   NA 140 09/04/2017 1124   NA 138 01/21/2015 0935   K 4.0 12/29/2019 0000   K 3.9 01/21/2015 0935   CL 107 12/29/2019 0000   CL 108 (H) 06/25/2012 1508   CO2 25 12/29/2019 0000   CO2 25 01/21/2015 0935   GLUCOSE 116 (H) 12/29/2019 0000   GLUCOSE 128 01/21/2015 0935   GLUCOSE 107 (H) 06/25/2012 1508   BUN 10 12/29/2019 0000   BUN 10 09/04/2017 1124   BUN 7.0 01/21/2015 0935   CREATININE 0.63 12/29/2019 0000   CREATININE 0.8 01/21/2015 0935   CALCIUM 9.5 12/29/2019 0000   CALCIUM 9.4 01/21/2015 0935   PROT 8.8 (H) 12/29/2019 0000   PROT 8.7 (H) 12/29/2019 0000   PROT 9.2 (H) 10/27/2018 1145   PROT 9.2 (H) 01/21/2015 0935   ALBUMIN 4.0 09/10/2018 1040   ALBUMIN 4.4 09/04/2017 1124   ALBUMIN 3.5 01/21/2015 0935   AST 24 12/29/2019 0000   AST 17 01/21/2015 0935   ALT 21 12/29/2019 0000   ALT 15 01/21/2015 0935   ALKPHOS 62 09/10/2018 1040   ALKPHOS 54 01/21/2015 0935   BILITOT 0.7 12/29/2019 0000   BILITOT 1.0 09/04/2017 1124   BILITOT 0.55 01/21/2015 0935   GFRNONAA 99 12/29/2019 0000   GFRAA 115 12/29/2019 0000    CBC    Component Value Date/Time   WBC 3.3 (L) 01/20/2020 1236   RBC 4.64 01/20/2020 1236   HGB 13.0 01/20/2020 1236   HGB 13.8 10/27/2018 1145   HGB 12.4 09/13/2016 0935   HCT 39.4 01/20/2020 1236   HCT 39.6 09/13/2016 0935   PLT 214 01/20/2020  1236   PLT 208 10/03/2018 1512   PLT 194 09/13/2016 0935   MCV 84.9 01/20/2020 1236   MCV 87.8 09/13/2016 0935   MCH 28.0 01/20/2020 1236   MCHC 33.0 01/20/2020 1236   RDW 13.1 01/20/2020 1236   RDW 14.4 09/13/2016 0935   LYMPHSABS 2,000 01/20/2020 1236   LYMPHSABS 1.4 09/13/2016 0935   MONOABS 0.3 10/30/2019 1101   MONOABS 0.2 09/13/2016 0935   EOSABS 79 01/20/2020 1236   EOSABS 0.1 09/13/2016 0935   BASOSABS 10 01/20/2020 1236   BASOSABS 0.0 09/13/2016 0935    Baseline Immunosuppressant Therapy Labs TB GOLD Quantiferon TB Gold Latest Ref Rng & Units 01/20/2020  Quantiferon TB Gold Plus NEGATIVE NEGATIVE   Hepatitis Panel Hepatitis Latest Ref Rng & Units 10/27/2018  Hep B IgM Negative Negative   HIV Lab Results  Component Value Date   HIV Non Reactive 10/27/2018   Immunoglobulins Immunoglobulin Electrophoresis Latest Ref Rng & Units 12/29/2019  IgA  47 - 310 mg/dL 598(H)  IgG 600 - 1,640 mg/dL 3,369(H)  IgM 50 - 300 mg/dL 59   SPEP Serum Protein Electrophoresis Latest Ref Rng & Units 12/29/2019  Total Protein 6.1 - 8.1 g/dL 8.7(H)  Albumin 3.8 - 4.8 g/dL -  Alpha-1 0.2 - 0.3 g/dL -  Alpha-2 0.5 - 0.9 g/dL -  Beta Globulin 0.4 - 0.6 g/dL -  Beta 2 0.2 - 0.5 g/dL -  Gamma Globulin 0.8 - 1.7 g/dL -  Interpretation - -     Chest x-ray: No acute cardiopulmonary disease on 11/01/17   Assessment/Plan:  Demonstrated proper injection technique with Clickject demo device  Patient able to demonstrate proper injection technique using the teach back method.  She did not perform the injection today due to having to leave for work, but she plans on returning tomorrow for the administration of the first injection.    Patient is to return in 2 weeks after orencia injection then 1 month then every 2 months.  Standing orders are in place   Orencia clickject approved through insurance.  Patient has the copay card.    All questions encouraged and answered.  Instructed patient  to call with any further questions or concerns.  Hazel Sams, PA-C   01/27/2020 3:16 PM

## 2020-01-27 NOTE — Patient Instructions (Addendum)
Standing Labs We placed an order today for your standing lab work.   Please have your standing labs drawn in 2 weeks after Orencia injection, then 1 month and then every 2 months  If possible, please have your labs drawn 2 weeks prior to your appointment so that the provider can discuss your results at your appointment.  We have open lab daily Monday through Thursday from 8:30-12:30 PM and 1:30-4:30 PM and Friday from 8:30-12:30 PM and 1:30-4:00 PM at the office of Dr. Bo Merino, [facility].   Please be advised, patients with office appointments requiring lab work will take precedents over walk-in lab work.  If possible, please come for your lab work on Monday and Friday afternoons, as you may experience shorter wait times. The office is located at [address], [place], [place], [place] [address] No appointment is necessary.   Labs are drawn by [facility]. Please bring your co-pay at the time of your lab draw.  You may receive a bill from [facility] for your lab work.  If you wish to have your labs drawn at another location, please call the office 24 hours in advance to send orders.  If you have any questions regarding directions or hours of operation,  please call [phone].   As a reminder, please drink plenty of water prior to coming for your lab work. Thanks!   Abatacept solution for injection (subcutaneous or intravenous use) What is this medicine? ABATACEPT (a ba TA sept) is used to treat moderate to severe active rheumatoid arthritis or psoriatic arthritis in adults. This medicine is also used to treat juvenile idiopathic arthritis. This medicine may be used for other purposes; ask your health care provider or pharmacist if you have questions. COMMON BRAND NAME(S): Orencia What should I tell my health care provider before I take this medicine? They need to know if you have any of these conditions:  cancer  diabetes  hepatitis B or history of hepatitis B  infection  immune system problems  infection or history of infection (especially a virus infection such as chickenpox, cold sores, or herpes)  lung or breathing problems, like chronic obstructive pulmonary disease (COPD)  recently received or scheduled to receive a vaccination  scheduled to have surgery  tuberculosis, a positive skin test for tuberculosis, or have recently been in close contact with someone who has tuberculosis  an unusual or allergic reaction to abatacept, other medicines, foods, dyes, or preservatives  pregnant or trying to get pregnant  breast-feeding How should I use this medicine? This medicine is for infusion into a vein or for injection under the skin. Infusions are given by a health care professional in a hospital or clinic setting. If you are to give your own medicine at home, you will be taught how to prepare and give this medicine under the skin. Use exactly as directed. Take your medicine at regular intervals. Do not take your medicine more often than directed. It is important that you put your used needles and syringes in a special sharps container. Do not put them in a trash can. If you do not have a sharps container, call your pharmacist or health care provider to get one. Talk to your pediatrician regarding the use of this medicine in children. While infusions in a clinic may be prescribed for children as young as 2 years for selected conditions, precautions do apply. Overdosage: If you think you have taken too much of this medicine contact a poison control center or  emergency room at once. NOTE: This medicine is only for you. Do not share this medicine with others. What if I miss a dose? This medicine is used once a week if given by injection under the skin. If you miss a dose, take it as soon as you can. If it is almost time for your next dose, take only that dose. Do not take double or extra doses. If you are to be given an infusion of this medicine, it  is important not to miss your dose. Doses are usually every 4 weeks. Call your doctor or health care professional if you are unable to keep an appointment. What may interact with this medicine? Do not take this medicine with any of the following medications:  live vaccines This medicine may also interact with the following medications:  anakinra  baricitinib  canakinumab  medicines that lower your chance of fighting an infection  rituximab  TNF blockers such as adalimumab, certolizumab, etanercept, golimumab, infliximab  tocilizumab  tofacitinib  upadacitinib  ustekinumab This list may not describe all possible interactions. Give your health care provider a list of all the medicines, herbs, non-prescription drugs, or dietary supplements you use. Also tell them if you smoke, drink alcohol, or use illegal drugs. Some items may interact with your medicine. What should I watch for while using this medicine? Visit your doctor for regular checks on your progress. Tell your doctor or health care professional if your symptoms do not start to get better or if they get worse. You will be tested for tuberculosis (TB) before you start this medicine. If your doctor prescribed any medicine for TB, you should start taking the TB medicine before starting this medicine. Make sure to finish the full course of TB medicine. This medicine may increase your risk of getting an infection. Call your doctor or health care professional if you get fever, chills, or sore throat, or other symptoms of a cold or flu. Do not treat yourself. Try to avoid being around people who are sick. If you have diabetes and are getting this medicine in a vein, the infusion can give false high blood sugar readings on the day of your dose. This may happen if you use certain types of blood glucose tests. Your health care provider may tell you to use a different way to monitor your blood sugar levels. What side effects may I notice  from receiving this medicine? Side effects that you should report to your doctor or health care professional as soon as possible:  allergic reactions like skin rash, itching or hives, swelling of the face, lips, or tongue  breathing problems  chest pain  dizziness  signs and symptoms of infection like fever; chills; cough; sore throat; pain or trouble passing urine  unusually weak or tired Side effects that usually do not require medical attention (report to your doctor or health care professional if they continue or are bothersome):  diarrhea  headache  nausea  pain, redness, or irritation at site where injected  stomach pain or upset This list may not describe all possible side effects. Call your doctor for medical advice about side effects. You may report side effects to FDA at 1-800-FDA-1088. Where should I keep my medicine? Infusions will be given in a hospital or clinic and will not be stored at home. Storage for syringes and autoinjectors stored at home: Keep out of the reach of children. Store in a refrigerator between 2 and 8 degrees C (36 and 46 degrees  F). Keep this medicine in the original container. Protect from light. Do not freeze. Do not shake. Throw away any unused medicine after the expiration date. NOTE: This sheet is a summary. It may not cover all possible information. If you have questions about this medicine, talk to your doctor, pharmacist, or health care provider.  2021 Elsevier/Gold Standard (2018-06-24 14:01:21)  COVID-19 vaccine recommendations:   COVID-19 vaccine is recommended for everyone (unless you are allergic to a vaccine component), even if you are on a medication that suppresses your immune system.   If you are on Methotrexate, Cellcept (mycophenolate), Rinvoq, Amber [name], and Olumiant- hold the medication for 1 week after each vaccine. Hold Methotrexate for 2 weeks after the single dose COVID-19 vaccine.   If you are on Orencia subcutaneous  injection - hold medication one week prior to and one week after the first COVID-19 vaccine dose (only).   If you are on Orencia IV infusions- time vaccination administration so that the first COVID-19 vaccination will occur four weeks after the infusion and postpone the subsequent infusion by one week.   If you are on Cyclophosphamide or Rituxan infusions please contact your doctor prior to receiving the COVID-19 vaccine.   Do not take Tylenol or any anti-inflammatory medications (NSAIDs) 24 hours prior to the COVID-19 vaccination.   There is no direct evidence about the efficacy of the COVID-19 vaccine in individuals who are on medications that suppress the immune system.   Even if you are fully vaccinated, and you are on any medications that suppress your immune system, please continue to wear a mask, maintain at least six feet social distance and practice hand hygiene.   If you develop a COVID-19 infection, please contact your PCP or our office to determine if you need monoclonal antibody infusion.  The booster vaccine is now available for immunocompromised patients.   Please see the following web sites for updated information.   [email or web address]

## 2020-01-28 ENCOUNTER — Ambulatory Visit: Payer: 59 | Admitting: Pharmacist

## 2020-01-28 ENCOUNTER — Other Ambulatory Visit: Payer: Self-pay

## 2020-01-28 DIAGNOSIS — M0579 Rheumatoid arthritis with rheumatoid factor of multiple sites without organ or systems involvement: Secondary | ICD-10-CM

## 2020-01-28 MED ORDER — ORENCIA CLICKJECT 125 MG/ML ~~LOC~~ SOAJ: 125.0000 mg | SUBCUTANEOUS | 2 refills | Status: DC

## 2020-01-28 NOTE — Patient Instructions (Addendum)
Orencia dose: 125 mg (1 pen) every 14 days  Next dose is due 02/11/20 (set a phone reminder). Then come get labs. We will guide you further after that.  Your Amber [name] has to be filled through [facility]: [phone]. Save this phone number in your phone book so you know when they call you. Please call the pharmacy to have your medication shipped to your home. Provide them with your copay card information.   Standing Labs Please have your standing labs drawn in 2 weeks, then 1 month, then every 3 months  If possible, please have your labs drawn 2 weeks prior to your appointment so that the provider can discuss your results at your appointment.  We have open lab daily Monday through Thursday from 8:30-12:30 PM and 1:30-4:30 PM and Friday from 8:30-12:30 PM and 1:30-4:00 PM at the office of Dr. Bo Merino, [facility].   Please be advised, patients with office appointments requiring lab work will take precedents over walk-in lab work.  If possible, please come for your lab work on Monday and Friday afternoons, as you may experience shorter wait times. The office is located at [address], [place], [place], [place] [address] No appointment is necessary.   Labs are drawn by [facility]. Please bring your co-pay at the time of your lab draw.  You may receive a bill from [facility] for your lab work.  If you wish to have your labs drawn at another location, please call the office 24 hours in advance to send orders.  If you have any questions regarding directions or hours of operation,  please call [phone].   As a reminder, please drink plenty of water prior to coming for your lab work. Thanks!

## 2020-01-28 NOTE — Telephone Encounter (Signed)
Copay card information: [id number] Group: [id number] PCN: [employer] ID: [id number]  Sent rx with copay card information.

## 2020-01-28 NOTE — Telephone Encounter (Signed)
Patient received first dose of Orencia Clickject on 5/91/63 at pharmacy visit. Dose will be 125 mg every 14 days until labs stabilize.

## 2020-01-28 NOTE — Progress Notes (Signed)
Pharmacy Note  Subjective:   Patient presents to clinic today to receive first dose of Orencia Clickject.  PMH significant for rheumatoid arthritis, Sjogren's, and osteoarthritis, CAD, CHF, and T2DM with microvascular complications. She was previously taking methotrexate but had consistent neutropenia and had to be discontinued. We will plan to monitor her closely  Paitent does not have a history of COPD.   Patient running a fever or have signs/symptoms of infection? No  Patient currently on antibiotics for the treatment of infection? No  Patient have any upcoming invasive procedures/surgeries? No  Objective: CMP     Component Value Date/Time   NA 139 12/29/2019 0000   NA 140 09/04/2017 1124   NA 138 01/21/2015 0935   K 4.0 12/29/2019 0000   K 3.9 01/21/2015 0935   CL 107 12/29/2019 0000   CL 108 (H) 06/25/2012 1508   CO2 25 12/29/2019 0000   CO2 25 01/21/2015 0935   GLUCOSE 116 (H) 12/29/2019 0000   GLUCOSE 128 01/21/2015 0935   GLUCOSE 107 (H) 06/25/2012 1508   BUN 10 12/29/2019 0000   BUN 10 09/04/2017 1124   BUN 7.0 01/21/2015 0935   CREATININE 0.63 12/29/2019 0000   CREATININE 0.8 01/21/2015 0935   CALCIUM 9.5 12/29/2019 0000   CALCIUM 9.4 01/21/2015 0935   PROT 8.8 (H) 12/29/2019 0000   PROT 8.7 (H) 12/29/2019 0000   PROT 9.2 (H) 10/27/2018 1145   PROT 9.2 (H) 01/21/2015 0935   ALBUMIN 4.0 09/10/2018 1040   ALBUMIN 4.4 09/04/2017 1124   ALBUMIN 3.5 01/21/2015 0935   AST 24 12/29/2019 0000   AST 17 01/21/2015 0935   ALT 21 12/29/2019 0000   ALT 15 01/21/2015 0935   ALKPHOS 62 09/10/2018 1040   ALKPHOS 54 01/21/2015 0935   BILITOT 0.7 12/29/2019 0000   BILITOT 1.0 09/04/2017 1124   BILITOT 0.55 01/21/2015 0935   GFRNONAA 99 12/29/2019 0000   GFRAA 115 12/29/2019 0000    CBC    Component Value Date/Time   WBC 3.3 (L) 01/20/2020 1236   RBC 4.64 01/20/2020 1236   HGB 13.0 01/20/2020 1236   HGB 13.8 10/27/2018 1145   HGB 12.4 09/13/2016 0935   HCT 39.4  01/20/2020 1236   HCT 39.6 09/13/2016 0935   PLT 214 01/20/2020 1236   PLT 208 10/03/2018 1512   PLT 194 09/13/2016 0935   MCV 84.9 01/20/2020 1236   MCV 87.8 09/13/2016 0935   MCH 28.0 01/20/2020 1236   MCHC 33.0 01/20/2020 1236   RDW 13.1 01/20/2020 1236   RDW 14.4 09/13/2016 0935   LYMPHSABS 2,000 01/20/2020 1236   LYMPHSABS 1.4 09/13/2016 0935   MONOABS 0.3 10/30/2019 1101   MONOABS 0.2 09/13/2016 0935   EOSABS 79 01/20/2020 1236   EOSABS 0.1 09/13/2016 0935   BASOSABS 10 01/20/2020 1236   BASOSABS 0.0 09/13/2016 0935    Baseline Immunosuppressant Therapy Labs TB GOLD Quantiferon TB Gold Latest Ref Rng & Units 01/20/2020  Quantiferon TB Gold Plus NEGATIVE NEGATIVE   Hepatitis Panel Hepatitis Latest Ref Rng & Units 10/27/2018  Hep B IgM Negative Negative   HIV Lab Results  Component Value Date   HIV Non Reactive 10/27/2018   Immunoglobulins Immunoglobulin Electrophoresis Latest Ref Rng & Units 12/29/2019  IgA  47 - 310 mg/dL 598(H)  IgG 600 - 1,640 mg/dL 3,369(H)  IgM 50 - 300 mg/dL 59   SPEP Serum Protein Electrophoresis Latest Ref Rng & Units 12/29/2019  Total Protein 6.1 - 8.1 g/dL 8.7(H)  Albumin 3.8 - 4.8 g/dL -  Alpha-1 0.2 - 0.3 g/dL -  Alpha-2 0.5 - 0.9 g/dL -  Beta Globulin 0.4 - 0.6 g/dL -  Beta 2 0.2 - 0.5 g/dL -  Gamma Globulin 0.8 - 1.7 g/dL -  Interpretation - -   Assessment/Plan:   Patient's dose will be: Orencia Clickject SQ 932 mg every 14 days.  Once labs are stable, dose will be increased to 125mg  every 7 days. Demonstrated proper injection technique with Orencia Clickject demo device  Patient able to demonstrate proper injection technique using the teach back method.  Patient self injected in the right lower abdomen with:  Sample Medication: Orencia Clickject 125mg /mL NDC: [address]-98 Lot: [id number] Expiration: 02/2021  Patient tolerated well.  Observed for 30 mins in office for adverse reaction.   In regards to labs, patient is to  return in 2 weeks, then 1 month, then every 2 months. Standing orders already in place.   Orencia Clickject approved through insurance.  Prescription sent to [facility]. Patient provided with pharmacy phone number - advised to save phone number in her phone book, and call when she has 2 pens left at home.  Copay card: Copay card: [id number] Mylar: [id number]; PCN: [employer]; ID: [id number]  All questions encouraged and answered.  Instructed patient to call with any further questions or concerns.  Knox Saliva, PharmD, MPH Clinical Pharmacist (Rheumatology and Pulmonology)  01/28/2020 8:13 AM

## 2020-01-31 ENCOUNTER — Other Ambulatory Visit: Payer: Self-pay | Admitting: Family

## 2020-01-31 ENCOUNTER — Other Ambulatory Visit: Payer: Self-pay

## 2020-01-31 DIAGNOSIS — I10 Essential (primary) hypertension: Secondary | ICD-10-CM

## 2020-02-01 ENCOUNTER — Telehealth: Payer: Self-pay

## 2020-02-01 LAB — LUPUS ANTICOAGULANT
Dilute Viper Venom Time: 27.5 s (ref 0.0–47.0)
PTT Lupus Anticoagulant: 28.9 s (ref 0.0–51.9)
Thrombin Time: 20.6 s (ref 0.0–23.0)
dPT Confirm Ratio: 0.85 Ratio (ref 0.00–1.34)
dPT: 30 s (ref 0.0–47.6)

## 2020-02-01 LAB — HEPATITIS C ANTIBODY: Hep C Virus Ab: <0.1 s/co ratio (ref 0.0–0.9)

## 2020-02-01 LAB — MTHFR DNA ANALYSIS

## 2020-02-01 LAB — PAN-ANCA
ANCA Proteinase 3: <3.5 U/mL (ref 0.0–3.5)
Atypical pANCA: <1:20 {titer}
C-ANCA: <1:20 {titer}
Myeloperoxidase Ab: <9 U/mL (ref 0.0–9.0)
P-ANCA: <1:20 {titer}

## 2020-02-01 LAB — FACTOR 5 LEIDEN

## 2020-02-01 MED ORDER — LISINOPRIL 40 MG PO TABS: 40.0000 mg | ORAL_TABLET | Freq: Every day | ORAL | 1 refills | Status: DC

## 2020-02-01 MED ORDER — ASPIRIN EC 81 MG PO TBEC
81.0000 mg | DELAYED_RELEASE_TABLET | Freq: Every day | ORAL | 3 refills | Status: DC
Start: 2020-02-01 — End: 2023-05-29

## 2020-02-01 MED ORDER — FUROSEMIDE 20 MG PO TABS: 20.0000 mg | ORAL_TABLET | Freq: Every day | ORAL | 1 refills | Status: DC | PRN

## 2020-02-01 MED ORDER — METOPROLOL SUCCINATE ER 100 MG PO TB24: 100.0000 mg | ORAL_TABLET | Freq: Every day | ORAL | 1 refills | Status: DC

## 2020-02-01 NOTE — Telephone Encounter (Signed)
Contacted pharmacy and spoke with [place] to advise patient does not have any allergies to medications. She states she has noted the patient's chart and they will continue processing prescription.

## 2020-02-01 NOTE — Telephone Encounter (Signed)
Erasmo Downer from [facility] called stating they need patient's allergy information so they can finish processing the prescription for Orencia.  Please call with verbal or fax.   Phone [phone] Fax [phone]

## 2020-02-02 ENCOUNTER — Telehealth: Payer: Self-pay

## 2020-02-02 ENCOUNTER — Ambulatory Visit: Payer: 59 | Admitting: Family

## 2020-02-02 NOTE — Telephone Encounter (Signed)
ok 

## 2020-02-02 NOTE — Telephone Encounter (Signed)
Spoke with pharmacy and gave verbal authorization to dispense 4 pens.

## 2020-02-02 NOTE — Telephone Encounter (Signed)
Larena Glassman, pharmacist at [facility] left a voicemail stating they received Orencia prescription 125 mg every 14 days.  She states the prescription is written for a quantity of 2 and the Orencia pens are only available in a box of 4.  They are not able to open the box to just send 2.  They need to get verification and approval to send 4 of the Orencia pens which would be a 56 day supply to the patient.  Please return call for that approval [phone] option 3 to speak with the pharmacist.

## 2020-02-03 ENCOUNTER — Other Ambulatory Visit: Payer: Self-pay

## 2020-02-03 ENCOUNTER — Encounter: Payer: Self-pay | Admitting: Family

## 2020-02-03 ENCOUNTER — Ambulatory Visit: Payer: 59 | Admitting: Family

## 2020-02-03 VITALS — BP 134/70 | HR 56 | Temp 98.4°F | Resp 16 | Ht 61.0 in | Wt 213.4 lb

## 2020-02-03 DIAGNOSIS — I10 Essential (primary) hypertension: Secondary | ICD-10-CM | POA: Diagnosis not present

## 2020-02-03 DIAGNOSIS — E119 Type 2 diabetes mellitus without complications: Secondary | ICD-10-CM

## 2020-02-03 MED ORDER — ADULT BLOOD PRESSURE CUFF LG KIT: PACK | 0 refills | Status: DC

## 2020-02-03 MED ORDER — BLOOD GLUCOSE MONITOR KIT: PACK | 0 refills | Status: DC

## 2020-02-03 NOTE — Progress Notes (Signed)
Subjective:    Patient ID: Amber [name], female    DOB: 1960-01-12, 60 y.o.   MRN: [id number]  HPI   Patient is a 60 year old female who presents today for follow-up of her hypertension. Last visit bp was elevated and we added amlodipine 5mg . Denies any increased swelling.  BP Readings from Last 3 Encounters:  02/03/20 134/70  01/27/20 119/76  01/21/20 (!) 152/93       Review of Systems   See HPI  Past Medical History:  Diagnosis Date  . Anemia   . Arthritis   . Blood transfusion without reported diagnosis   . Colon polyps   . Controlled type 2 diabetes mellitus without complication, without long-term current use of insulin ([facility]) 05/31/2017  . Ectopic pregnancy    RIGHT salpingostomy  . Elective abortion   . GERD (gastroesophageal reflux disease)   . Glaucoma    both eyes  . Headache    migraines  . History of chicken pox   . Hyperlipemia   . Hypertension   . Rheumatoid arthritis(714.0) 11/07/2011  . SAB (spontaneous abortion)   . Shortness of breath   . Sjogren's disease ([place])   . Wheezing      Social History   Socioeconomic History  . Marital status: Single    Spouse name: Not on file  . Number of children: 0  . Years of education: college  . Highest education level: Not on file  Occupational History  . Occupation: LPN    Employer: [employer]  Tobacco Use  . Smoking status: Never Smoker  . Smokeless tobacco: Never Used  Vaping Use  . Vaping Use: Never used  Substance and Sexual Activity  . Alcohol use: No    Alcohol/week: 0.0 standard drinks  . Drug use: No  . Sexual activity: Yes    Birth control/protection: None    Comment: 1st intercourse- 17, partners- 5  Other Topics Concern  . 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  Had a sister up in [place]  who passed away April 12, 2015 who she was close to.   Born [place]- she came to [place] as adult.  Age 20.    Left-handed.         Social Determinants of Health   Financial Resource Strain: Not on file  Food Insecurity: Not on file  Transportation Needs: Not on file  Physical Activity: Not on file  Stress: Not on file  Social Connections: Not on file  Intimate Partner Violence: Not on file    Past Surgical History:  Procedure Laterality Date  . ABDOMINAL SURGERY     bowel repair  . COLONOSCOPY    . DILATION AND CURETTAGE OF UTERUS     X 2  . MENISCUS REPAIR Right    MENISCUS TEAR REPAIR  . MYOMECTOMY N/A 07/27/2014   Procedure: ABDOMINAL MYOMECTOMY;  Surgeon: Waymon Amato, MD;  Location: [facility];  Service: Gynecology;  Laterality: N/A;  . PELVIC LAPAROSCOPY  1994   IN 1999 LAPAROSCOPY WITH INCIDENTAL ENTEROTOMY REQUIRING LAPAROTOMY  . UTERINE FIBROID SURGERY     July 2016    Family History  Problem Relation Age of Onset  . Hypertension Mother        died from heart  disease  . Heart disease Mother        deceased, unknown cause  . Stroke Mother   . Obesity Mother   . Diabetes Sister   . Hypertension Sister   . Esophageal cancer Sister   . Stomach cancer Sister   . Cancer Sister   . Diabetes Sister   . Rectal cancer Neg Hx   . Colon cancer Neg Hx     No Known Allergies  Current Outpatient Medications on File Prior to Visit  Medication Sig Dispense Refill  . Abatacept (ORENCIA CLICKJECT) 102 MG/ML SOAJ Inject 125 mg into the skin every 14 (fourteen) days. Copay card: [id number]; Grp: [id number]; PCN: [facility]; ID: [id number] 2 mL 2  . albuterol (VENTOLIN HFA) 108 (90 Base) MCG/ACT inhaler Inhale 2 puffs into the lungs every 6 (six) hours as needed for wheezing or shortness of breath. 18 g 5  . amLODipine (NORVASC) 5 MG tablet Take 1 tablet (5 mg total) by mouth daily. 30 tablet 3  . aspirin EC 81 MG tablet Take 1 tablet (81 mg total) by mouth daily. 90 tablet 3  . calcium-vitamin  D (OSCAL WITH D) 500-200 MG-UNIT TABS tablet Take by mouth.    . Cyanocobalamin (VITAMIN B-12) 5000 MCG LOZG Take 1 tablet by mouth 2 (two) times daily.    . ferrous sulfate 325 (65 FE) MG tablet Take 325 mg by mouth 3 (three) times daily with meals.    . Fluticasone-Salmeterol (ADVAIR) 250-50 MCG/DOSE AEPB Inhale 1 puff into the lungs 2 (two) times daily. 60 each 5  . folic acid (FOLVITE) 1 MG tablet Take 1 tablet (1 mg total) by mouth daily. 90 tablet 0  . furosemide (LASIX) 20 MG tablet Take 1 tablet (20 mg total) by mouth daily as needed for fluid. 90 tablet 1  . guaiFENesin-codeine (ROBITUSSIN AC) 100-10 MG/5ML syrup Take 5 mLs by mouth 3 (three) times daily as needed for cough. 75 mL 0  . hydrochlorothiazide (HYDRODIURIL) 25 MG tablet Take 1 tablet (25 mg total) by mouth daily. 90 tablet 1  . Lifitegrast (XIIDRA) 5 % SOLN Place 1 drop into both eyes 2 times daily.    Marland Kitchen lisinopril (ZESTRIL) 40 MG tablet Take 1 tablet (40 mg total) by mouth daily. 90 tablet 1  . metoprolol succinate (TOPROL-XL) 100 MG 24 hr tablet Take 1 tablet (100 mg total) by mouth daily. Take with or immediately following a meal. 90 tablet 1  . SF 5000 PLUS 1.1 % CREA dental cream As directed.    . SUMAtriptan (IMITREX) 50 MG tablet TAKE 1 TABLET BY MOUTH EVERY 2 HOURS AS NEEDED FOR  MIGRAINE  MAY  REPEAT  IN  2  HOURS  IF  HEADACHE  PERSISTS  OR  RECURS 9 tablet 5  . TRAVATAN Z 0.004 % SOLN ophthalmic solution Place 1 drop into both eyes at bedtime.      No current facility-administered medications on file prior to visit.    BP 134/70 (BP Location: Right Arm, Patient Position: Sitting, Cuff Size: Large)   Pulse (!) 56   Temp 98.4 F (36.9 C) (Oral)   Resp 16   Ht 5\' 1"  (1.549 m)   Wt 213 lb 6.4 oz (96.8 kg)   LMP 08/09/2016   SpO2 100%   BMI 40.32 kg/m       Objective:   Physical Exam Constitutional:      Appearance: She is well-developed and well-nourished.  Cardiovascular:     Rate  and Rhythm: Normal  rate and regular rhythm.     Heart sounds: Normal heart sounds. No murmur heard.   Pulmonary:     Effort: Pulmonary effort is normal. No respiratory distress.     Breath sounds: Normal breath sounds. No wheezing.  Psychiatric:        Mood and Affect: Mood and affect normal.        Behavior: Behavior normal.        Thought Content: Thought content normal.        Judgment: Judgment normal.           Assessment & Plan:  Hypertension-patient is tolerating amlodipine 5 mg once daily.  Blood pressure is improved.  Continue same.   This visit occurred during the SARS-CoV-2 public health emergency.  Safety protocols were in place, including screening questions prior to the visit, additional usage of staff PPE, and extensive cleaning of exam room while observing appropriate contact time as indicated for disinfecting solutions.

## 2020-02-03 NOTE — Patient Instructions (Signed)
Please continue amlodipine 5mg .

## 2020-02-04 ENCOUNTER — Ambulatory Visit
Admission: RE | Admit: 2020-02-04 | Discharge: 2020-02-04 | Disposition: A | Discharge status: Home / Self Care | Payer: 59 | Source: Ambulatory Visit | Attending: Neurology | Admitting: Neurology

## 2020-02-04 ENCOUNTER — Telehealth: Payer: Self-pay | Admitting: Neurology

## 2020-02-04 DIAGNOSIS — I679 Cerebrovascular disease, unspecified: Secondary | ICD-10-CM

## 2020-02-04 MED ORDER — IOPAMIDOL (ISOVUE-370) INJECTION 76%
75.0000 mL | Freq: Once | INTRAVENOUS | Status: AC | PRN
  Administered 2020-02-04: 75 mL via INTRAVENOUS

## 2020-02-04 NOTE — Telephone Encounter (Signed)
I called the patient.  CT angiogram of the head neck was relatively unremarkable.  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  I will get a 2D echocardiogram of the patient, and the patient is interested in pursuing genetic testing for CADASIL syndrome.  I will have my nurse contact [place] labs and determine what the cost would be with her insurance.   CTA head and neck 02/04/20:  IMPRESSION: CT head:  1. No evidence of acute intracranial abnormality. 2. Redemonstrated significantly age-advanced cerebral white matter disease. Findings are nonspecific but differential considerations include sequela of chronic small vessel ischemic disease, autoimmune disease, CADASIL, vasculitis, sequela of a prior infectious/inflammatory process or a demyelinating process, among others.  CTA neck:  1. The common carotid, internal carotid and vertebral arteries are patent within the neck without stenosis. No significant atherosclerotic disease. 2. Atrophy of the bilateral parotid and submandibular glands with bilateral intraparotid nodules and calcifications. Findings likely reflect sequela of reported Sjogren disease. 3. Nonspecific mildly enlarged left level 2 lymph node, measuring 12 mm in short axis.  CTA head:  1. No intracranial large vessel occlusion or proximal high-grade arterial stenosis. 2. 1 mm vascular protrusion arising from the P1 segment of the left posterior cerebral artery, which may reflect a small aneurysm.

## 2020-02-10 ENCOUNTER — Telehealth: Payer: Self-pay | Admitting: Emergency Medicine

## 2020-02-10 NOTE — Telephone Encounter (Signed)
Called patient and went over what Amber [name] from [facility] gave me regarding cost to have 1175, Cadasil test, CPT code 81406.  Patient stated she is going to have to wait until she meets her copay.    Advised her to call back to the office when she is ready to schedule the testing.    Patient denied further questions, verbalized understanding and expressed appreciation for the phone call.

## 2020-02-10 NOTE — Telephone Encounter (Signed)
Called [employer], [employer] @ [phone] for copay.  Patient's copay would be $1482.81, estimated.  Patient has a $1000 copay, as of this writing, she has met $87.19 of that copay.  Her plan pays for 80% of testing, she is responsible fo 20%.  The test is $2850 x 20% = $570.00.   $1000-$87.19 $912.81 + $570.  $$4388.87 patient's cost.

## 2020-02-10 NOTE — Telephone Encounter (Signed)
[facility] Patient Insurance.  For test 1175, self pay, cost will be $2850.00.

## 2020-02-18 NOTE — Progress Notes (Signed)
Order(s) created erroneously. Erroneous order ID: [id number]  Order moved by: Genia Harold D  Order move date/time: 02/18/2020 1:27 PM  Source Patient: [id number]  Source Contact: 12/11/2019  Destination Patient: [id number]  Destination Contact: 11/03/2019

## 2020-02-19 ENCOUNTER — Other Ambulatory Visit: Payer: Self-pay

## 2020-02-19 DIAGNOSIS — Z79899 Other long term (current) drug therapy: Secondary | ICD-10-CM

## 2020-02-20 LAB — CBC WITH DIFFERENTIAL/PLATELET
Absolute Monocytes: 273 cells/uL (ref 200–950)
Basophils Absolute: 11 cells/uL (ref 0–200)
Basophils Relative: 0.3 %
Eosinophils Absolute: 60 cells/uL (ref 15–500)
Eosinophils Relative: 1.7 %
HCT: 37.1 % (ref 35.0–45.0)
Hemoglobin: 12.3 g/dL (ref 11.7–15.5)
Lymphs Abs: 2485 cells/uL (ref 850–3900)
MCH: 28.3 pg (ref 27.0–33.0)
MCHC: 33.2 g/dL (ref 32.0–36.0)
MCV: 85.3 fL (ref 80.0–100.0)
MPV: 11 fL (ref 7.5–12.5)
Monocytes Relative: 7.8 %
Neutro Abs: 672 cells/uL — ABNORMAL LOW (ref 1500–7800)
Neutrophils Relative %: 19.2 %
Platelets: 220 10*3/uL (ref 140–400)
RBC: 4.35 10*6/uL (ref 3.80–5.10)
RDW: 13 % (ref 11.0–15.0)
Total Lymphocyte: 71 %
WBC: 3.5 10*3/uL — ABNORMAL LOW (ref 3.8–10.8)

## 2020-02-20 LAB — COMPLETE METABOLIC PANEL WITH GFR
AG Ratio: 0.8 (calc) — ABNORMAL LOW (ref 1.0–2.5)
ALT: 20 U/L (ref 6–29)
AST: 21 U/L (ref 10–35)
Albumin: 3.9 g/dL (ref 3.6–5.1)
Alkaline phosphatase (APISO): 55 U/L (ref 37–153)
BUN: 10 mg/dL (ref 7–25)
CO2: 27 mmol/L (ref 20–32)
Calcium: 9.5 mg/dL (ref 8.6–10.4)
Chloride: 107 mmol/L (ref 98–110)
Creat: 0.63 mg/dL (ref 0.50–1.05)
GFR, Est African American: 115 mL/min/{1.73_m2} (ref >=60)
GFR, Est Non African American: 99 mL/min/{1.73_m2} (ref >=60)
Globulin: 4.9 g/dL (calc) — ABNORMAL HIGH (ref 1.9–3.7)
Glucose, Bld: 84 mg/dL (ref 65–99)
Potassium: 4.3 mmol/L (ref 3.5–5.3)
Sodium: 140 mmol/L (ref 135–146)
Total Bilirubin: 0.6 mg/dL (ref 0.2–1.2)
Total Protein: 8.8 g/dL — ABNORMAL HIGH (ref 6.1–8.1)

## 2020-02-22 NOTE — Progress Notes (Signed)
CBC and CMP are stable.

## 2020-02-24 NOTE — Progress Notes (Signed)
Office Visit Note  Patient: Amber [name]             Date of Birth: 1960-11-01           MRN: [id number]             PCP: [name], NP Referring: [name], NP Visit Date: 03/09/2020 Occupation: @GUAROCC @  Subjective:  Pain in both knee joints  History of Present Illness: Amber [name] is a 60 y.o. female with history of seropositive rheumatoid arthritis, osteoarthritis, and sjogren's syndrome.  She is on Orencia 125 mg sq injections every 14 days.  She is spacing the dose of orencia due to history of neutropenia. She was initially started on orencia on 01/28/20. She states she has noticed about 50% improvement since starting on orencia after 4 injections.  She denies any injection site reactions or other side effects.  She has not had any recent infections. She continues to have pain in both knee joints, especially the right knee.  She states the swelling in her knees have improved. She states she recently injured her left shoulder at work and was evaluated by an orthopedist with [employer] last week. She reports that she recently saw Dr. Paulla Dolly for ongoing pain in her right foot.  She states that she was diagnosed with plantar fasciitis and also has a bone spur.  She was advised to wear an immobilizer boot and was given a Medrol Dosepak which she has not started yet.        Activities of Daily Living:  Patient reports morning stiffness for 15-20 minutes.   Patient Denies nocturnal pain.  Difficulty dressing/grooming: Denies Difficulty climbing stairs: Denies Difficulty getting out of chair: Denies Difficulty using hands for taps, buttons, cutlery, and/or writing: Denies  Review of Systems  Constitutional: Negative for fatigue.  HENT: Positive for mouth dryness and nose dryness. Negative for mouth sores.   Eyes: Negative for pain, itching, visual disturbance and dryness.  Respiratory: Negative for cough, hemoptysis, shortness of breath and difficulty  breathing.   Cardiovascular: Positive for palpitations. Negative for chest pain, hypertension and swelling in legs/feet.  Gastrointestinal: Negative for blood in stool, constipation and diarrhea.  Endocrine: Negative for increased urination.  Genitourinary: Negative for difficulty urinating and painful urination.  Musculoskeletal: Positive for arthralgias, joint pain, myalgias, morning stiffness, muscle tenderness and myalgias. Negative for joint swelling and muscle weakness.  Skin: Negative for color change, pallor, rash, hair loss, nodules/bumps, redness, skin tightness, ulcers and sensitivity to sunlight.  Allergic/Immunologic: Negative for susceptible to infections.  Neurological: Positive for headaches. Negative for dizziness, numbness, memory loss and weakness.  Hematological: Negative for bruising/bleeding tendency and swollen glands.  Psychiatric/Behavioral: Negative for depressed mood, confusion and sleep disturbance. The patient is not nervous/anxious.     PMFS History:  Patient Active Problem List   Diagnosis Date Noted  . Diabetic peripheral neuropathy ([place]) 01/05/2020  . Class 2 severe obesity with serious comorbidity and body mass index (BMI) of 38.0 to 38.9 in adult, unspecified obesity type ([place]) 01/05/2020  . Adrenal mass, right ([place]) 01/05/2020  . Other fatigue 09/04/2017  . Shortness of breath on exertion 09/04/2017  . Essential hypertension 09/04/2017  . Controlled type 2 diabetes mellitus without complication, without long-term current use of insulin ([place]) 05/31/2017  . Chronic diastolic CHF (congestive heart failure) ([place]) 04/24/2017  . CAD (coronary artery disease) 04/24/2017  . Screening-pulmonary TB 06/14/2016  . History of BCG vaccination 06/14/2016  . Fibroids 07/27/2014  .  Migraine 04/19/2014  . Nail fungus 04/09/2014  . Elevated serum protein level 04/09/2014  . Preventative health care 04/09/2014  . Iron deficiency anemia 09/02/2013  . Headache(784.0)  03/04/2013  . Obesity (BMI 30-39.9) 10/17/2012  . Palpitations 10/01/2012  . Glaucoma 11/07/2011  . Rheumatoid arthritis ([place]) 11/07/2011  . Borderline diabetes 11/07/2011  . HTN (hypertension) 11/05/2011  . Sjogren's disease ([place]) 11/05/2011  . Anemia 09/22/2010  . Fibroid uterus 09/22/2010  . Neutropenia ([place]) 09/22/2010  . Dysmenorrhea 09/15/2010    Past Medical History:  Diagnosis Date  . Anemia   . Arthritis   . Blood transfusion without reported diagnosis   . Colon polyps   . Controlled type 2 diabetes mellitus without complication, without long-term current use of insulin ([place]) 05/31/2017  . Ectopic pregnancy    RIGHT salpingostomy  . Elective abortion   . GERD (gastroesophageal reflux disease)   . Glaucoma    both eyes  . Headache    migraines  . History of chicken pox   . Hyperlipemia   . Hypertension   . Rheumatoid arthritis(714.0) 11/07/2011  . SAB (spontaneous abortion)   . Shortness of breath   . Sjogren's disease ([place])   . Wheezing     Family History  Problem Relation Age of Onset  . Hypertension Mother        died from heart disease  . Heart disease Mother        deceased, unknown cause  . Stroke Mother   . Obesity Mother   . Diabetes Sister   . Hypertension Sister   . Esophageal cancer Sister   . Stomach cancer Sister   . Cancer Sister   . Diabetes Sister   . Rectal cancer Neg Hx   . Colon cancer Neg Hx    Past Surgical History:  Procedure Laterality Date  . ABDOMINAL SURGERY     bowel repair  . COLONOSCOPY    . DILATION AND CURETTAGE OF UTERUS     X 2  . MENISCUS REPAIR Right    MENISCUS TEAR REPAIR  . MYOMECTOMY N/A 07/27/2014   Procedure: ABDOMINAL MYOMECTOMY;  Surgeon: Waymon Amato, MD;  Location: [facility];  Service: Gynecology;  Laterality: N/A;  . PELVIC LAPAROSCOPY  1994   IN 1999 LAPAROSCOPY WITH INCIDENTAL ENTEROTOMY REQUIRING LAPAROTOMY  . 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  Had a sister up in [place] who passed away 2015-03-24 who she was close to.   Born in [place]- she came to [place] as adult.  Age 11.    Left-handed.         Immunization History  Administered Date(s) Administered  . Hepatitis A, Adult 11/01/2017, 12/03/2018  . IPV 11/01/2017  . Influenza Split 11/07/2011  . Influenza,inj,Quad PF,6+ Mos 10/17/2012, 01/15/2014, 08/31/2014, 09/30/2017, 09/10/2018, 09/21/2019  . Meningococcal Mcv4o 11/01/2017  . Moderna Sars-Covid-2 Vaccination 01/28/2019, 02/27/2019, 11/27/2019  . Pneumococcal Polysaccharide-23 09/10/2018  . Tdap 06/01/2011, 11/25/2012  . Typhoid Live 11/01/2017  . Zoster Recombinat (Shingrix) 10/04/2017, 12/04/2017     Objective: Vital Signs: BP (!) 153/92 (BP Location: Left Wrist, Patient Position: Sitting, Cuff Size: Normal)   Pulse 60   Resp 14   Ht  5\' 1"  (1.549 m)   Wt 213 lb 3.2 oz (96.7 kg)   LMP 08/09/2016   BMI 40.28 kg/m    Physical Exam Vitals and nursing note reviewed.  Constitutional:      Appearance: She is well-developed and well-nourished.  HENT:     Head: Normocephalic and atraumatic.  Eyes:     Extraocular Movements: EOM normal.     Conjunctiva/sclera: Conjunctivae normal.  Cardiovascular:     Pulses: Intact distal pulses.  Pulmonary:     Effort: Pulmonary effort is normal.  Abdominal:     Palpations: Abdomen is soft.  Musculoskeletal:     Cervical back: Normal range of motion.  Skin:    General: Skin is warm and dry.     Capillary Refill: Capillary refill takes less than 2 seconds.  Neurological:     Mental Status: She is alert and oriented to person, place, and time.  Psychiatric:        Mood and Affect: Mood and affect normal.        Behavior: Behavior normal.      Musculoskeletal Exam: C-spine, thoracic spine,  lumbar spine good range of motion with no discomfort.  Right shoulder has full range of motion with no discomfort.  Left shoulder has limited abduction to about 90 degrees.  Elbow joints and wrist joints have good range of motion.  She has tenderness location on the dorsal aspect of both wrists but no synovitis was noted.  No tenderness or synovitis of MCP joints noted.  She was able to make a complete fist bilaterally.  Hip joints have good range of motion with some discomfort bilaterally.  She has painful range of motion of the right knee joint with mild warmth.  Left knee has good range of motion with no warmth or effusion.  She has tenderness and swelling over the right ankle joint.  Left ankle has good range of motion with mild tenderness.  Tenderness over the right plantar fascia. Tenderness over right 1st MTP.    CDAI Exam: CDAI Score: 6.3  Patient Global: 8 mm; Provider Global: 5 mm Swollen: 2 ; Tender: 7  Joint Exam 03/09/2020      Right  Left  Glenohumeral      Tender  Wrist   Tender   Tender  Knee  Swollen Tender     Ankle  Swollen Tender   Tender  MTP 1   Tender        Investigation: No additional findings.  Imaging: No results found.  Recent Labs: Lab Results  Component Value Date   WBC 3.5 (L) 02/19/2020   HGB 12.3 02/19/2020   PLT 220 02/19/2020   NA 140 02/19/2020   K 4.3 02/19/2020   CL 107 02/19/2020   CO2 27 02/19/2020   GLUCOSE 84 02/19/2020   BUN 10 02/19/2020   CREATININE 0.63 02/19/2020   BILITOT 0.6 02/19/2020   ALKPHOS 62 09/10/2018   AST 21 02/19/2020   ALT 20 02/19/2020   PROT 8.8 (H) 02/19/2020   ALBUMIN 4.0 09/10/2018   CALCIUM 9.5 02/19/2020   GFRAA 115 02/19/2020   QFTBGOLD Negative 06/14/2016   QFTBGOLDPLUS NEGATIVE 01/20/2020    Speciality Comments: PLQ eye exam: 12/09/2018 normal. [facility]  Procedures:  No procedures performed Allergies: Patient has no known allergies.    Assessment / Plan:     Visit Diagnoses:  Rheumatoid arthritis involving multiple sites with positive rheumatoid factor (HCC) - Positive RF, negative anti-CCP, 14 3 3  eta positive, history of rheumatoid arthritis for the last 10 years.  She was under care of Dr. Graylon Gunning and [name]: She has ongoing pain in the right knee joint and right ankle joint.  She has warmth and tenderness to palpation over the right ankle on examination today.  Tenderness on the dorsal aspect of both wrists but no synovitis or warmth was noted.  No joint tenderness or synovitis of MCP joints noted.  She was able to make a complete fist bilaterally.  She is currently on Orencia 125 mg subcutaneous injections every 14 days.   She initially started on Orencia on 01/28/2020 and has had 4 injections so far.  She has been spacing the dose of Orencia due to history of neutropenia, which has been stable. She has noticed about a 50% improvement in her symptoms since starting on Orencia.  Her quality of life has significantly improved over the past several weeks.  She has not had any injection site reactions or side effects.  She has not had any recent infections.  Lab work from 02/19/2020 was reviewed with the patient today in the office.  Her white blood cell count was 3.5 at that time.  We discussed increasing the frequency of Orencia to every 10 days and to have lab work rechecked in 2 to 3 weeks. She will continue to require frequent lab monitoring.   She will follow-up in the office in 2 months to assess her full response to [place].  High risk medication use - Orencia 125 mg subcutaneous every 10 days. Initially started on Orencia 01/28/20. (methotrexate was discontinued due to neutropenia). CBC and CMP updated on 02/19/20.  She will return for lab work in 2 weeks.  She will continue to require frequent lab monitoring due to history of neutropenia.  Standing orders for CBC and CMP remain in place.  TB gold negative on 01/20/20 and will continue to be monitored yearly. She has not  had any recent infections.  She is aware that she is to hold Orencia if she develops signs or symptoms of an infection and to be evaluated urgently.  She will need clearance prior to resuming Orencia.  She has had 3 moderna covid-19 vaccine doses.   Sjogren's syndrome with other organ involvement ([place]) - Anti-Ro positive, anti-La positive: She has ongoing mouth and nose dryness but has not been experiencing any dry eyes recently.  Primary osteoarthritis of both knees: Chronic pain.  The discomfort in her right knee is more severe than the left knee.  Overall the inflammation has started to improve since starting on Orencia.  She had good range of motion of both knee joints with pain in the right knee.  Mild warmth but no effusion was noted.  Primary osteoarthritis of both feet: She continues to experience discomfort in both feet especially the right foot.  She established care with Dr. Felisa Bonier and is being treated for plantar fasciitis of the right foot.  She was advised to start wearing the immobilizer boot provided by Dr. Paulla Dolly.  Plantar fasciitis of right foot: She was evaluated by Dr. Paulla Dolly on 03/07/2020 and was diagnosed with plantar fasciitis of the right foot.  Her symptoms have been refractory to conservative treatment.  She had a right plantar fascia cortisone injection performed at office appointment.  She was advised to start wearing an air fracture walker to completely immobilize her right foot but she has not started wearing this yet.  She was also given a Sterapred DS Dosepak  which she has not picked up from the pharmacy.  She will be following back up with Dr. Paulla Dolly on 03/28/2020.  Other medical conditions are listed as follows:  Family history of rheumatoid arthritis - Sister  Essential hypertension  Controlled type 2 diabetes mellitus without complication, without long-term current use of insulin ([place])  Coronary artery disease involving native coronary artery of native heart without  angina pectoris  Hx of migraines  Chronic diastolic CHF (congestive heart failure) ([place])  Other fatigue  Palpitations  Orders: No orders of the defined types were placed in this encounter.  No orders of the defined types were placed in this encounter.   Follow-Up Instructions: Return in about 2 months (around 05/09/2020) for Rheumatoid arthritis, Osteoarthritis.   Ofilia Neas, PA-C  Note - This record has been created using Dragon software.  Chart creation errors have been sought, but may not always  have been located. Such creation errors do not reflect on  the standard of medical care.

## 2020-03-04 ENCOUNTER — Other Ambulatory Visit (HOSPITAL_COMMUNITY): Payer: 59

## 2020-03-04 ENCOUNTER — Encounter (HOSPITAL_COMMUNITY): Payer: Self-pay | Admitting: Neurology

## 2020-03-07 ENCOUNTER — Ambulatory Visit (INDEPENDENT_AMBULATORY_CARE_PROVIDER_SITE_OTHER): Payer: 59 | Admitting: Podiatry

## 2020-03-07 ENCOUNTER — Other Ambulatory Visit: Payer: Self-pay

## 2020-03-07 ENCOUNTER — Encounter: Payer: Self-pay | Admitting: Podiatry

## 2020-03-07 ENCOUNTER — Ambulatory Visit (INDEPENDENT_AMBULATORY_CARE_PROVIDER_SITE_OTHER): Payer: 59

## 2020-03-07 DIAGNOSIS — M21619 Bunion of unspecified foot: Secondary | ICD-10-CM

## 2020-03-07 DIAGNOSIS — M79672 Pain in left foot: Secondary | ICD-10-CM

## 2020-03-07 DIAGNOSIS — M79671 Pain in right foot: Secondary | ICD-10-CM

## 2020-03-07 DIAGNOSIS — M779 Enthesopathy, unspecified: Secondary | ICD-10-CM | POA: Diagnosis not present

## 2020-03-07 DIAGNOSIS — M722 Plantar fascial fibromatosis: Secondary | ICD-10-CM

## 2020-03-07 MED ORDER — PREDNISONE 10 MG PO TABS
ORAL_TABLET | ORAL | 0 refills | Status: DC
Start: 2020-03-07 — End: 2020-05-03

## 2020-03-07 NOTE — Progress Notes (Signed)
Subjective:   Patient ID: Amber [name], female   DOB: 60 y.o.   MRN: [id number]   HPI Patient states she has been dealing for a number of years with pain in her heel right and also a significant bunion deformity right over left.  States that its been very sore she is also getting pain in her ankle with most of her problem in the right and some in the left.  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  Patient does not smoke likes to be active   Review of Systems  All other systems reviewed and are negative.       Objective:  Physical Exam Vitals and nursing note reviewed.  Constitutional:      Appearance: She is well-developed and well-nourished.  Cardiovascular:     Pulses: Intact distal pulses.  Pulmonary:     Effort: Pulmonary effort is normal.  Musculoskeletal:        General: Normal range of motion.  Skin:    General: Skin is warm.  Neurological:     Mental Status: She is alert.     Neurovascular status found to be intact muscle strength found to be adequate range of motion found to be adequate.  Patient is found to have exquisite discomfort plantar aspect right heel at the insertional point of the tendon into the calcaneus with inflammation fluid buildup flatfoot deformity bilateral and structural bunion deformity right over left with redness around the bump.  Patient also has discomfort in the right over left sinus tarsi and was found to have good digital perfusion well oriented x3     Assessment:  Acute Planter fasciitis right failing to respond so far to conservative treatment inflammatory capsulitis with bunion deformity right and sinus tarsitis which is probably compensation right over left     Plan:  H&P reviewed all conditions today.  I went ahead today and I injected the plantar fascia 3 mg Kenalog 5 mg Xylocaine at insertion calcaneus and around the first  MPJ 3 mg Dexasone Kenalog 5 mg Xylocaine to reduce inflammation and I applied air fracture walker to completely immobilize.  I discussed the possibility for surgery and if this does not respond that will be necessary as she has tried everything else I also placed on Sterapred DS Dosepak today  X-rays indicate there is structural bunion deformity right over left with prominence around the first metatarsal head and moderate flatfoot deformity with bone spur formation in the heel bilateral

## 2020-03-07 NOTE — Patient Instructions (Signed)

## 2020-03-09 ENCOUNTER — Other Ambulatory Visit: Payer: Self-pay

## 2020-03-09 ENCOUNTER — Encounter: Payer: Self-pay | Admitting: Physician Assistant

## 2020-03-09 ENCOUNTER — Ambulatory Visit: Payer: 59 | Admitting: Physician Assistant

## 2020-03-09 VITALS — BP 153/92 | HR 60 | Resp 14 | Ht 61.0 in | Wt 213.2 lb

## 2020-03-09 DIAGNOSIS — M170 Bilateral primary osteoarthritis of knee: Secondary | ICD-10-CM

## 2020-03-09 DIAGNOSIS — Z79899 Other long term (current) drug therapy: Secondary | ICD-10-CM

## 2020-03-09 DIAGNOSIS — Z8669 Personal history of other diseases of the nervous system and sense organs: Secondary | ICD-10-CM

## 2020-03-09 DIAGNOSIS — Z8261 Family history of arthritis: Secondary | ICD-10-CM

## 2020-03-09 DIAGNOSIS — M0579 Rheumatoid arthritis with rheumatoid factor of multiple sites without organ or systems involvement: Secondary | ICD-10-CM | POA: Diagnosis not present

## 2020-03-09 DIAGNOSIS — E119 Type 2 diabetes mellitus without complications: Secondary | ICD-10-CM

## 2020-03-09 DIAGNOSIS — I5032 Chronic diastolic (congestive) heart failure: Secondary | ICD-10-CM

## 2020-03-09 DIAGNOSIS — R5383 Other fatigue: Secondary | ICD-10-CM

## 2020-03-09 DIAGNOSIS — M3509 Sicca syndrome with other organ involvement: Secondary | ICD-10-CM | POA: Diagnosis not present

## 2020-03-09 DIAGNOSIS — I10 Essential (primary) hypertension: Secondary | ICD-10-CM

## 2020-03-09 DIAGNOSIS — M722 Plantar fascial fibromatosis: Secondary | ICD-10-CM

## 2020-03-09 DIAGNOSIS — I2510 Atherosclerotic heart disease of native coronary artery without angina pectoris: Secondary | ICD-10-CM

## 2020-03-09 DIAGNOSIS — M19072 Primary osteoarthritis, left ankle and foot: Secondary | ICD-10-CM

## 2020-03-09 DIAGNOSIS — R002 Palpitations: Secondary | ICD-10-CM

## 2020-03-09 DIAGNOSIS — M19071 Primary osteoarthritis, right ankle and foot: Secondary | ICD-10-CM

## 2020-03-09 MED ORDER — ORENCIA CLICKJECT 125 MG/ML ~~LOC~~ SOAJ: SUBCUTANEOUS | 2 refills | Status: DC

## 2020-03-09 NOTE — Patient Instructions (Signed)
Standing Labs We placed an order today for your standing lab work.   Please have your standing labs drawn in 2 weeks   If possible, please have your labs drawn 2 weeks prior to your appointment so that the provider can discuss your results at your appointment.  We have open lab daily Monday through Thursday from 1:30-4:30 PM and Friday from 1:30-4:00 PM at the office of Dr. Bo Merino, [facility].   Please be advised, all patients with office appointments requiring lab work will take precedents over walk-in lab work.  If possible, please come for your lab work on Monday and Friday afternoons, as you may experience shorter wait times. The office is located at [address] [place], [place], [place] [address] No appointment is necessary.   Labs are drawn by [facility]. Please bring your co-pay at the time of your lab draw.  You may receive a bill from [facility] for your lab work.  If you wish to have your labs drawn at another location, please call the office 24 hours in advance to send orders.  If you have any questions regarding directions or hours of operation,  please call [phone].   As a reminder, please drink plenty of water prior to coming for your lab work. Thanks!

## 2020-03-09 NOTE — Telephone Encounter (Signed)
orencia change you advised today at patient's appointment. Please review and sign "no print" prescription. Thanks!

## 2020-03-22 ENCOUNTER — Emergency Department (HOSPITAL_BASED_OUTPATIENT_CLINIC_OR_DEPARTMENT_OTHER): Payer: 59

## 2020-03-22 ENCOUNTER — Other Ambulatory Visit: Payer: Self-pay

## 2020-03-22 ENCOUNTER — Encounter (HOSPITAL_BASED_OUTPATIENT_CLINIC_OR_DEPARTMENT_OTHER): Payer: Self-pay | Admitting: Emergency Medicine

## 2020-03-22 ENCOUNTER — Emergency Department (HOSPITAL_BASED_OUTPATIENT_CLINIC_OR_DEPARTMENT_OTHER)
Admission: EM | Admit: 2020-03-22 | Discharge: 2020-03-22 | Disposition: A | Discharge status: Home / Self Care | Payer: 59 | Attending: Emergency Medicine | Admitting: Emergency Medicine

## 2020-03-22 DIAGNOSIS — G43809 Other migraine, not intractable, without status migrainosus: Secondary | ICD-10-CM

## 2020-03-22 DIAGNOSIS — G43909 Migraine, unspecified, not intractable, without status migrainosus: Secondary | ICD-10-CM | POA: Diagnosis not present

## 2020-03-22 DIAGNOSIS — I110 Hypertensive heart disease with heart failure: Secondary | ICD-10-CM | POA: Diagnosis not present

## 2020-03-22 DIAGNOSIS — E1140 Type 2 diabetes mellitus with diabetic neuropathy, unspecified: Secondary | ICD-10-CM | POA: Insufficient documentation

## 2020-03-22 DIAGNOSIS — I5032 Chronic diastolic (congestive) heart failure: Secondary | ICD-10-CM | POA: Diagnosis not present

## 2020-03-22 DIAGNOSIS — Z79899 Other long term (current) drug therapy: Secondary | ICD-10-CM | POA: Insufficient documentation

## 2020-03-22 DIAGNOSIS — Z7982 Long term (current) use of aspirin: Secondary | ICD-10-CM | POA: Diagnosis not present

## 2020-03-22 DIAGNOSIS — I2510 Atherosclerotic heart disease of native coronary artery without angina pectoris: Secondary | ICD-10-CM | POA: Insufficient documentation

## 2020-03-22 LAB — BASIC METABOLIC PANEL
Anion gap: 9 (ref 5–15)
BUN: 16 mg/dL (ref 6–20)
CO2: 29 mmol/L (ref 22–32)
Calcium: 9.3 mg/dL (ref 8.9–10.3)
Chloride: 97 mmol/L — ABNORMAL LOW (ref 98–111)
Creatinine, Ser: 0.8 mg/dL (ref 0.44–1.00)
GFR, Estimated: >60 mL/min (ref >60)
Glucose, Bld: 138 mg/dL — ABNORMAL HIGH (ref 70–99)
Potassium: 3.5 mmol/L (ref 3.5–5.1)
Sodium: 135 mmol/L (ref 135–145)

## 2020-03-22 LAB — CBC WITH DIFFERENTIAL/PLATELET
Abs Immature Granulocytes: 0.01 10*3/uL (ref 0.00–0.07)
Basophils Absolute: 0 10*3/uL (ref 0.0–0.1)
Basophils Relative: 0 %
Eosinophils Absolute: 0.1 10*3/uL (ref 0.0–0.5)
Eosinophils Relative: 1 %
HCT: 42.9 % (ref 36.0–46.0)
Hemoglobin: 13.8 g/dL (ref 12.0–15.0)
Immature Granulocytes: 0 %
Lymphocytes Relative: 41 %
Lymphs Abs: 2.2 10*3/uL (ref 0.7–4.0)
MCH: 28.2 pg (ref 26.0–34.0)
MCHC: 32.2 g/dL (ref 30.0–36.0)
MCV: 87.7 fL (ref 80.0–100.0)
Monocytes Absolute: 0.3 10*3/uL (ref 0.1–1.0)
Monocytes Relative: 6 %
Neutro Abs: 2.7 10*3/uL (ref 1.7–7.7)
Neutrophils Relative %: 52 %
Platelets: 258 10*3/uL (ref 150–400)
RBC: 4.89 MIL/uL (ref 3.87–5.11)
RDW: 13.8 % (ref 11.5–15.5)
WBC: 5.4 10*3/uL (ref 4.0–10.5)
nRBC: 0 % (ref 0.0–0.2)

## 2020-03-22 MED ORDER — METOCLOPRAMIDE HCL 5 MG/ML IJ SOLN
10.0000 mg | Freq: Once | INTRAMUSCULAR | Status: AC
  Administered 2020-03-22: 10 mg via INTRAVENOUS
  Filled 2020-03-22: qty 2

## 2020-03-22 MED ORDER — DIVALPROEX SODIUM ER 250 MG PO TB24
500.0000 mg | ORAL_TABLET | Freq: Every day | ORAL | Status: DC
  Administered 2020-03-22: 500 mg via ORAL
  Filled 2020-03-22: qty 2

## 2020-03-22 MED ORDER — KETOROLAC TROMETHAMINE 30 MG/ML IJ SOLN
30.0000 mg | Freq: Once | INTRAMUSCULAR | Status: AC
  Administered 2020-03-22: 30 mg via INTRAVENOUS
  Filled 2020-03-22: qty 1

## 2020-03-22 MED ORDER — SODIUM CHLORIDE 0.9 % IV SOLN
12.5000 mg | Freq: Once | INTRAVENOUS | Status: DC
  Filled 2020-03-22: qty 0.5

## 2020-03-22 MED ORDER — METOCLOPRAMIDE HCL 10 MG PO TABS: 10.0000 mg | ORAL_TABLET | Freq: Three times a day (TID) | ORAL | 0 refills | Status: DC | PRN

## 2020-03-22 MED ORDER — LIDOCAINE 5 % EX PTCH
2.0000 | MEDICATED_PATCH | CUTANEOUS | Status: DC
  Administered 2020-03-22: 2 via TRANSDERMAL
  Filled 2020-03-22: qty 2

## 2020-03-22 MED ORDER — SUMATRIPTAN SUCCINATE 6 MG/0.5ML ~~LOC~~ SOLN
6.0000 mg | Freq: Once | SUBCUTANEOUS | Status: AC
  Administered 2020-03-22: 6 mg via SUBCUTANEOUS
  Filled 2020-03-22: qty 0.5

## 2020-03-22 MED ORDER — DIVALPROEX SODIUM ER 250 MG PO TB24: 500.0000 mg | ORAL_TABLET | ORAL | Status: DC

## 2020-03-22 MED ORDER — DIPHENHYDRAMINE HCL 50 MG/ML IJ SOLN
25.0000 mg | Freq: Once | INTRAMUSCULAR | Status: AC
  Administered 2020-03-22: 25 mg via INTRAVENOUS
  Filled 2020-03-22: qty 1

## 2020-03-22 MED ORDER — ACETAMINOPHEN 500 MG PO TABS
1000.0000 mg | ORAL_TABLET | Freq: Once | ORAL | Status: AC
  Administered 2020-03-22: 1000 mg via ORAL
  Filled 2020-03-22: qty 2

## 2020-03-22 MED ORDER — IOHEXOL 350 MG/ML SOLN
80.0000 mL | Freq: Once | INTRAVENOUS | Status: AC | PRN
  Administered 2020-03-22: 80 mL via INTRAVENOUS

## 2020-03-22 MED ORDER — MAGNESIUM SULFATE 2 GM/50ML IV SOLN
2.0000 g | Freq: Once | INTRAVENOUS | Status: AC
  Administered 2020-03-22: 2 g via INTRAVENOUS
  Filled 2020-03-22: qty 50

## 2020-03-22 MED ORDER — PROMETHAZINE HCL 25 MG/ML IJ SOLN
INTRAMUSCULAR | Status: AC
  Administered 2020-03-22: 12.5 mg
  Filled 2020-03-22: qty 1

## 2020-03-22 NOTE — ED Provider Notes (Signed)
[facility] EMERGENCY DEPARTMENT Provider Note   CSN: [id number] Arrival date & time: 03/22/20  0033     History Chief Complaint  Patient presents with   Migraine   Nausea    Amber [name] is a 60 y.o. female.  The history is provided by the patient.  Migraine This is a recurrent problem. The current episode started more than 2 days ago. The problem occurs constantly. The problem has not changed since onset.Associated symptoms include headaches. Pertinent negatives include no chest pain, no abdominal pain and no shortness of breath. Nothing aggravates the symptoms. Nothing relieves the symptoms. Treatments tried: naproxen. The treatment provided no relief.  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  Pain has not changes in the that area since then, no progression.  She can move her neck without difficulty and was able to drive self to the ER.   She also reports migraine headache in the front of the head that started over the weekend gradually on Saturday.  It was not sudden onset.  No f/c/r.  No atypical features. No rashes on the skin.  No photophobia. Some nausea.  No emesis.  No weakness, no numbness, no changes in vision or speech.       Past Medical History:  Diagnosis Date   Anemia    Arthritis    Blood transfusion without reported diagnosis    Colon polyps    Controlled type 2 diabetes mellitus without complication, without long-term current use of insulin ([place]) 05/31/2017   Ectopic pregnancy    RIGHT salpingostomy   Elective abortion    GERD (gastroesophageal reflux disease)    Glaucoma    both eyes   Headache    migraines   History of chicken pox    Hyperlipemia    Hypertension    Rheumatoid arthritis(714.0) 11/07/2011   SAB (spontaneous abortion)    Shortness of breath    Sjogren's disease ([place])    Wheezing     Patient Active  Problem List   Diagnosis Date Noted   Diabetic peripheral neuropathy ([place]) 01/05/2020   Class 2 severe obesity with serious comorbidity and body mass index (BMI) of 38.0 to 38.9 in adult, unspecified obesity type ([place]) 01/05/2020   Adrenal mass, 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   Other fatigue 09/04/2017   Shortness of breath on exertion 09/04/2017   Essential hypertension 09/04/2017   Controlled type 2 diabetes mellitus without complication, without long-term current use of insulin ([place]) 05/31/2017   Chronic diastolic CHF (congestive heart failure) ([place]) 04/24/2017   CAD (coronary artery disease) 04/24/2017   Screening-pulmonary TB 06/14/2016   History of BCG vaccination 06/14/2016   Fibroids 07/27/2014   Migraine 04/19/2014   Nail fungus 04/09/2014   Elevated serum protein level 04/09/2014   Preventative health care 04/09/2014   Iron deficiency anemia 09/02/2013   Headache(784.0) 03/04/2013   Obesity (BMI 30-39.9) 10/17/2012   Palpitations 10/01/2012   Glaucoma 11/07/2011   Rheumatoid arthritis ([place]) 11/07/2011   Borderline diabetes 11/07/2011   HTN (hypertension) 11/05/2011   Sjogren's disease ([place]) 11/05/2011   Anemia 09/22/2010   Fibroid uterus 09/22/2010   Neutropenia ([place]) 09/22/2010   Dysmenorrhea 09/15/2010    Past Surgical History:  Procedure Laterality Date   ABDOMINAL SURGERY     bowel repair   COLONOSCOPY     DILATION AND CURETTAGE OF UTERUS     X 2   MENISCUS  REPAIR Right    MENISCUS TEAR REPAIR   MYOMECTOMY N/A 07/27/2014   Procedure: ABDOMINAL MYOMECTOMY;  Surgeon: Waymon Amato, MD;  Location: [facility];  Service: Gynecology;  Laterality: N/A;   PELVIC LAPAROSCOPY  1994   IN 1999 LAPAROSCOPY WITH INCIDENTAL ENTEROTOMY REQUIRING LAPAROTOMY   UTERINE FIBROID SURGERY     July 2016     OB History    Gravida  3   Para  0   Term      Preterm      AB  2   Living  0     SAB      IAB      Ectopic  1   Multiple      Live  Births              Family History  Problem Relation Age of Onset   Hypertension Mother        died from heart disease   Heart disease Mother        deceased, unknown cause   Stroke Mother    Obesity Mother    Diabetes Sister    Hypertension Sister    Esophageal cancer Sister    Stomach cancer Sister    Cancer Sister    Diabetes Sister    Rectal cancer Neg Hx    Colon cancer Neg Hx     Social History   Tobacco Use   Smoking status: Never Smoker   Smokeless tobacco: Never Used  Vaping Use   Vaping Use: Never used  Substance Use Topics   Alcohol use: No    Alcohol/week: 0.0 standard drinks   Drug use: No    Home Medications Prior to Admission medications   Medication Sig Start Date End Date Taking? Authorizing Provider  Abatacept (ORENCIA CLICKJECT) 659 MG/ML SOAJ Inject 155m into the skin every 10 days. Copay card: [id number] Mylar [id number] PCN: [employer]; ID: [id number]/9/22   DOfilia Neas PA-C  albuterol (VENTOLIN HFA) 108 (90 Base) MCG/ACT inhaler Inhale 2 puffs into the lungs every 6 (six) hours as needed for wheezing or shortness of breath. 10/12/19   ODebbrah Alar NP  amLODipine (NORVASC) 5 MG tablet Take 1 tablet (5 mg total) by mouth daily. 01/05/20   ODebbrah Alar NP  aspirin EC 81 MG tablet Take 1 tablet (81 mg total) by mouth daily. 02/01/20   ODebbrah Alar NP  blood glucose meter kit and supplies KIT Dispense based on patient and insurance preference. Use up to four times daily as directed. (FOR ICD-9 250.00, 250.01). 02/03/20   ODebbrah Alar NP  Blood Pressure Monitoring (ADULT BLOOD PRESSURE CUFF LG) KIT Use as directed 02/03/20   ODebbrah Alar NP  calcium-vitamin D (OSCAL WITH D) 500-200 MG-UNIT TABS tablet Take by mouth.    [provider]  Cyanocobalamin (VITAMIN B-12) 5000 MCG LOZG Take 1 tablet by mouth 2 (two) times daily. 11/04/15   [provider]  ferrous sulfate 325 (65 FE) MG  tablet Take 325 mg by mouth 3 (three) times daily with meals.    [provider]  Fluticasone-Salmeterol (ADVAIR) 250-50 MCG/DOSE AEPB Inhale 1 puff into the lungs 2 (two) times daily. 09/10/18   ODebbrah Alar NP  folic acid (FOLVITE) 1 MG tablet Take 1 tablet (1 mg total) by mouth daily. 12/17/19   DOfilia Neas PA-C  furosemide (LASIX) 20 MG tablet Take 1 tablet (20 mg total) by mouth daily as needed for fluid. 02/01/20   OInda Castle  [name], NP  hydrochlorothiazide (HYDRODIURIL) 25 MG tablet Take 1 tablet (25 mg total) by mouth daily. Patient taking differently: Take 25 mg by mouth as needed. 09/21/19   [name], NP  Lifitegrast Shirley Friar) 5 % SOLN Place 1 drop into both eyes 2 times daily. 12/09/18   [provider]  lisinopril (ZESTRIL) 40 MG tablet Take 1 tablet (40 mg total) by mouth daily. 02/01/20   [name], NP  metoprolol succinate (TOPROL-XL) 100 MG 24 hr tablet Take 1 tablet (100 mg total) by mouth daily. Take with or immediately following a meal. 02/01/20   [name], NP  predniSONE (DELTASONE) 10 MG tablet 12 day tapering dose Patient not taking: Reported on 03/09/2020 03/07/20   Wallene Huh, DPM  predniSONE (STERAPRED UNI-PAK 48 TAB) 10 MG (48) TBPK tablet Take by mouth as directed. 03/07/20   [provider]  SUMAtriptan (IMITREX) 50 MG tablet TAKE 1 TABLET BY MOUTH EVERY 2 HOURS AS NEEDED FOR  MIGRAINE  MAY  REPEAT  IN  2  HOURS  IF  HEADACHE  PERSISTS  OR  RECURS 09/10/18   [name], NP  TRAVATAN Z 0.004 % SOLN ophthalmic solution Place 1 drop into both eyes at bedtime.  10/25/11   [provider]    Allergies    Patient has no known allergies.  Review of Systems   Review of Systems  Constitutional: Negative for chills, diaphoresis, fatigue and fever.  HENT: Negative for congestion and sore throat.   Eyes: Negative for photophobia, pain, redness and visual disturbance.  Respiratory: Negative for  shortness of breath.   Cardiovascular: Negative for chest pain.  Gastrointestinal: Positive for nausea. Negative for abdominal pain and vomiting.  Genitourinary: Negative for difficulty urinating and dysuria.  Musculoskeletal: Positive for arthralgias.  Skin: Negative for rash.  Neurological: Positive for headaches. Negative for dizziness, seizures, syncope, facial asymmetry, speech difficulty, weakness and numbness.  Psychiatric/Behavioral: Negative for agitation and confusion.  All other systems reviewed and are negative.   Physical Exam Updated Vital Signs BP (!) 161/76 (BP Location: Left Arm)    Pulse 78    Temp 99 F (37.2 C) (Oral)    Resp 20    Ht 5' 1"  (1.549 m)    Wt 96 kg    LMP 08/09/2016    SpO2 98%    BMI 39.99 kg/m   Physical Exam Vitals and nursing note reviewed.  Constitutional:      General: She is not in acute distress.    Appearance: Normal appearance. She is not ill-appearing.  HENT:     Head: Normocephalic and atraumatic.     Nose: Nose normal.  Eyes:     Extraocular Movements: Extraocular movements intact.     Conjunctiva/sclera: Conjunctivae normal.     Pupils: Pupils are equal, round, and reactive to light.     Comments: No proptosis, disk sharp, intact cognition   Neck:     Vascular: No carotid bruit.     Meningeal: Brudzinski's sign and Kernig's sign absent.   Cardiovascular:     Rate and Rhythm: Normal rate and regular rhythm.     Pulses: Normal pulses.     Heart sounds: Normal heart sounds.  Pulmonary:     Effort: Pulmonary effort is normal.     Breath sounds: Normal breath sounds.  Abdominal:     General: Abdomen is flat. Bowel sounds are normal.     Tenderness: There is no abdominal tenderness. There is no guarding.  Musculoskeletal:        General: Normal range of motion.     Cervical back: No rigidity.     Right lower leg: No edema.     Left lower leg: No edema.  Lymphadenopathy:     Cervical: No cervical adenopathy.  Skin:     General: Skin is warm and dry.     Capillary Refill: Capillary refill takes less than 2 seconds.  Neurological:     General: No focal deficit present.     Mental Status: She is alert.     Cranial Nerves: No cranial nerve deficit.     Deep Tendon Reflexes: Reflexes normal.  Psychiatric:        Mood and Affect: Mood is anxious.     ED Results / Procedures / Treatments   Labs (all labs ordered are listed, but only abnormal results are displayed) Results for orders placed or performed during the hospital encounter of 03/22/20  CBC with Differential/Platelet  Result Value Ref Range   WBC 5.4 4.0 - 10.5 K/uL   RBC 4.89 3.87 - 5.11 MIL/uL   Hemoglobin 13.8 12.0 - 15.0 g/dL   HCT 42.9 36.0 - 46.0 %   MCV 87.7 80.0 - 100.0 fL   MCH 28.2 26.0 - 34.0 pg   MCHC 32.2 30.0 - 36.0 g/dL   RDW 13.8 11.5 - 15.5 %   Platelets 258 150 - 400 K/uL   nRBC 0.0 0.0 - 0.2 %   Neutrophils Relative % 52 %   Neutro Abs 2.7 1.7 - 7.7 K/uL   Lymphocytes Relative 41 %   Lymphs Abs 2.2 0.7 - 4.0 K/uL   Monocytes Relative 6 %   Monocytes Absolute 0.3 0.1 - 1.0 K/uL   Eosinophils Relative 1 %   Eosinophils Absolute 0.1 0.0 - 0.5 K/uL   Basophils Relative 0 %   Basophils Absolute 0.0 0.0 - 0.1 K/uL   Immature Granulocytes 0 %   Abs Immature Granulocytes 0.01 0.00 - 0.07 K/uL  Basic metabolic panel  Result Value Ref Range   Sodium 135 135 - 145 mmol/L   Potassium 3.5 3.5 - 5.1 mmol/L   Chloride 97 (L) 98 - 111 mmol/L   CO2 29 22 - 32 mmol/L   Glucose, Bld 138 (H) 70 - 99 mg/dL   BUN 16 6 - 20 mg/dL   Creatinine, Ser 0.80 0.44 - 1.00 mg/dL   Calcium 9.3 8.9 - 10.3 mg/dL   GFR, Estimated >60 >60 mL/min   Anion gap 9 5 - 15   CT Angio Head W or Wo Contrast  Result Date: 03/22/2020 CLINICAL DATA:  Migraine and neck pain for 3 days. History of aneurysm. EXAM: CT ANGIOGRAPHY HEAD AND NECK TECHNIQUE: Multidetector CT imaging of the head and neck was performed using the standard protocol during bolus  administration of intravenous contrast. Multiplanar CT image reconstructions and MIPs were obtained to evaluate the vascular anatomy. Carotid stenosis measurements (when applicable) are obtained utilizing NASCET criteria, using the distal internal carotid diameter as the denominator. CONTRAST:  43m OMNIPAQUE IOHEXOL 350 MG/ML SOLN COMPARISON:  02/04/2020 FINDINGS: CT HEAD FINDINGS Brain: There is no mass, hemorrhage or extra-axial collection. The size and configuration of the ventricles and extra-axial CSF spaces are normal. There is hypoattenuation of the periventricular white matter, most commonly indicating chronic ischemic microangiopathy. Skull: The visualized skull base, calvarium and extracranial soft tissues are normal. Sinuses/Orbits: No fluid levels or advanced mucosal thickening of the visualized paranasal sinuses. No  mastoid or middle ear effusion. The orbits are normal. CTA NECK FINDINGS SKELETON: There is no bony spinal canal stenosis. No lytic or blastic lesion. OTHER NECK: Normal pharynx, larynx and major salivary glands. No cervical lymphadenopathy. Unremarkable thyroid gland. UPPER CHEST: No pneumothorax or pleural effusion. No nodules or masses. AORTIC ARCH: There is no calcific atherosclerosis of the aortic arch. There is no aneurysm, dissection or hemodynamically significant stenosis of the visualized portion of the aorta. Conventional 3 vessel aortic branching pattern. The visualized proximal subclavian arteries are widely patent. RIGHT CAROTID SYSTEM: Normal without aneurysm, dissection or stenosis. LEFT CAROTID SYSTEM: Normal without aneurysm, dissection or stenosis. VERTEBRAL ARTERIES: Left dominant configuration. Both origins are clearly patent. There is no dissection, occlusion or flow-limiting stenosis to the skull base (V1-V3 segments). CTA HEAD FINDINGS POSTERIOR CIRCULATION: --Vertebral arteries: Normal V4 segments. --Inferior cerebellar arteries: Normal. --Basilar artery: Normal.  --Superior cerebellar arteries: Normal. --Posterior cerebral arteries (PCA): Unchanged 1 mm outpouching arising from the left PCA P1 segment. ANTERIOR CIRCULATION: --Intracranial internal carotid arteries: Normal. --Anterior cerebral arteries (ACA): Normal --Middle cerebral arteries (MCA): Normal. VENOUS SINUSES: As permitted by contrast timing, patent. ANATOMIC VARIANTS: None Review of the MIP images confirms the above findings. IMPRESSION: 1. No intracranial arterial occlusion or high-grade stenosis. 2. No dissection or hemodynamically significant stenosis of the carotid or vertebral arteries. 3. Chronic ischemic microangiopathy. Electronically Signed   By: Ulyses Jarred M.D.   On: 03/22/2020 02:30   CT Angio Neck W and/or Wo Contrast  Result Date: 03/22/2020 CLINICAL DATA:  Migraine and neck pain for 3 days. History of aneurysm. EXAM: CT ANGIOGRAPHY HEAD AND NECK TECHNIQUE: Multidetector CT imaging of the head and neck was performed using the standard protocol during bolus administration of intravenous contrast. Multiplanar CT image reconstructions and MIPs were obtained to evaluate the vascular anatomy. Carotid stenosis measurements (when applicable) are obtained utilizing NASCET criteria, using the distal internal carotid diameter as the denominator. CONTRAST:  74m OMNIPAQUE IOHEXOL 350 MG/ML SOLN COMPARISON:  02/04/2020 FINDINGS: CT HEAD FINDINGS Brain: There is no mass, hemorrhage or extra-axial collection. The size and configuration of the ventricles and extra-axial CSF spaces are normal. There is hypoattenuation of the periventricular white matter, most commonly indicating chronic ischemic microangiopathy. Skull: The visualized skull base, calvarium and extracranial soft tissues are normal. Sinuses/Orbits: No fluid levels or advanced mucosal thickening of the visualized paranasal sinuses. No mastoid or middle ear effusion. The orbits are normal. CTA NECK FINDINGS SKELETON: There is no bony spinal canal  stenosis. No lytic or blastic lesion. OTHER NECK: Normal pharynx, larynx and major salivary glands. No cervical lymphadenopathy. Unremarkable thyroid gland. UPPER CHEST: No pneumothorax or pleural effusion. No nodules or masses. AORTIC ARCH: There is no calcific atherosclerosis of the aortic arch. There is no aneurysm, dissection or hemodynamically significant stenosis of the visualized portion of the aorta. Conventional 3 vessel aortic branching pattern. The visualized proximal subclavian arteries are widely patent. RIGHT CAROTID SYSTEM: Normal without aneurysm, dissection or stenosis. LEFT CAROTID SYSTEM: Normal without aneurysm, dissection or stenosis. VERTEBRAL ARTERIES: Left dominant configuration. Both origins are clearly patent. There is no dissection, occlusion or flow-limiting stenosis to the skull base (V1-V3 segments). CTA HEAD FINDINGS POSTERIOR CIRCULATION: --Vertebral arteries: Normal V4 segments. --Inferior cerebellar arteries: Normal. --Basilar artery: Normal. --Superior cerebellar arteries: Normal. --Posterior cerebral arteries (PCA): Unchanged 1 mm outpouching arising from the left PCA P1 segment. ANTERIOR CIRCULATION: --Intracranial internal carotid arteries: Normal. --Anterior cerebral arteries (ACA): Normal --Middle cerebral arteries (MCA): Normal. VENOUS SINUSES:  As permitted by contrast timing, patent. ANATOMIC VARIANTS: None Review of the MIP images confirms the above findings. IMPRESSION: 1. No intracranial arterial occlusion or high-grade stenosis. 2. No dissection or hemodynamically significant stenosis of the carotid or vertebral arteries. 3. Chronic ischemic microangiopathy. Electronically Signed   By: Ulyses Jarred M.D.   On: 03/22/2020 02:30   DG Foot 2 Views Left  Result Date: 03/21/2020 Please see detailed radiograph report in office note.  DG Foot 2 Views Right  Result Date: 03/21/2020 Please see detailed radiograph report in office note.   Radiology No results  found.  Procedures Procedures   Medications Ordered in ED Medications  promethazine (PHENERGAN) 12.5 mg in sodium chloride 0.9 % 50 mL IVPB (12.5 mg Intravenous Not Given 03/22/20 0104)  lidocaine (LIDODERM) 5 % 2 patch (2 patches Transdermal Patch Applied 03/22/20 0111)  divalproex (DEPAKOTE ER) 24 hr tablet 500 mg (500 mg Oral Given 03/22/20 0110)  magnesium sulfate IVPB 2 g 50 mL (0 g Intravenous Stopped 03/22/20 0114)  promethazine (PHENERGAN) 25 MG/ML injection (12.5 mg  Given 03/22/20 0104)  acetaminophen (TYLENOL) tablet 1,000 mg (1,000 mg Oral Given 03/22/20 0110)  iohexol (OMNIPAQUE) 350 MG/ML injection 80 mL (80 mLs Intravenous Contrast Given 03/22/20 0157)  metoCLOPramide (REGLAN) injection 10 mg (10 mg Intravenous Given 03/22/20 0238)  diphenhydrAMINE (BENADRYL) injection 25 mg (25 mg Intravenous Given 03/22/20 0239)  ketorolac (TORADOL) 30 MG/ML injection 30 mg (30 mg Intravenous Given 03/22/20 0239)    ED Course  I have reviewed the triage vital signs and the nursing notes.  Pertinent labs & imaging results that were available during my care of the patient were reviewed by me and considered in my medical decision making (see chart for details).   Patient is very well appearing. I do not believe this is meningitis or encephalitis.  She has range of motion of the neck no kernigs or Brudzinski sign.  No photophobia. White count is normal as is differential and patient would be very ill, more than 3 days into this.  There are no non-verbal cues of pain.  I do not believe the patient requires LP at this time.  There is no ICH on CT.  No dissection seen on CT.  I do not believe this is a cavernous sinus thrombosis as cognition is intact, as are disk margins and EOM, no proptosis.  Patient is markedly improved post medication.  She is stable for discharge with close follow up.  Strict return precautions given.    DURENDA PECHACEK was evaluated in Emergency Department on 03/22/2020 for the  symptoms described in the history of present illness. She was evaluated in the context of the global COVID-19 pandemic, which necessitated consideration that the patient might be at risk for infection with the SARS-CoV-2 virus that causes COVID-19. Institutional protocols and algorithms that pertain to the evaluation of patients at risk for COVID-19 are in a state of rapid change based on information released by regulatory bodies including the CDC and federal and state organizations. These policies and algorithms were followed during the patient's care in the ED.  Final Clinical Impression(s) / ED Diagnoses Return for intractable cough, coughing up blood, fevers >100.4 unrelieved by medication, shortness of breath, intractable vomiting, chest pain, shortness of breath, weakness, numbness, changes in speech, facial asymmetry, abdominal pain, passing out, Inability to tolerate liquids or food, cough, altered mental status or any concerns. No signs of systemic illness or infection. The patient is nontoxic-appearing on exam and  vital signs are within normal limits.  I have reviewed the triage vital signs and the nursing notes. Pertinent labs & imaging results that were available during my care of the patient were reviewed by me and considered in my medical decision making (see chart for details). After history, exam, and medical workup I feel the patient has been appropriately medically screened and is safe for discharge home. Pertinent diagnoses were discussed with the patient. Patient was given return precautions.   Todd Argabright, MD 03/22/20 [id number]

## 2020-03-22 NOTE — ED Triage Notes (Addendum)
Patient arrived via POV c/o migraine x 3 days with onset of nausea this evening. Patient states taking aleve at approximately 2000 with no relief. Patient able to turn head side to side in triage. Patient ambulatory from lobby to triage and triage to room. Patient is AO x 4, elevated BP, normal gait.

## 2020-03-28 ENCOUNTER — Ambulatory Visit: Payer: 59 | Admitting: Podiatry

## 2020-03-28 ENCOUNTER — Other Ambulatory Visit: Payer: Self-pay

## 2020-03-28 ENCOUNTER — Encounter: Payer: Self-pay | Admitting: Podiatry

## 2020-03-28 DIAGNOSIS — M722 Plantar fascial fibromatosis: Secondary | ICD-10-CM | POA: Diagnosis not present

## 2020-03-28 DIAGNOSIS — M21619 Bunion of unspecified foot: Secondary | ICD-10-CM

## 2020-03-28 DIAGNOSIS — M779 Enthesopathy, unspecified: Secondary | ICD-10-CM

## 2020-03-28 MED ORDER — DICLOFENAC SODIUM 75 MG PO TBEC: 75.0000 mg | DELAYED_RELEASE_TABLET | Freq: Two times a day (BID) | ORAL | 2 refills | Status: DC

## 2020-03-28 MED ORDER — TRIAMCINOLONE ACETONIDE 10 MG/ML IJ SUSP
10.0000 mg | Freq: Once | INTRAMUSCULAR | Status: AC
  Administered 2020-03-28: 10 mg

## 2020-03-29 ENCOUNTER — Telehealth: Payer: Self-pay | Admitting: Neurology

## 2020-03-29 ENCOUNTER — Ambulatory Visit (HOSPITAL_COMMUNITY): Payer: 59 | Attending: Internal Medicine

## 2020-03-29 DIAGNOSIS — I679 Cerebrovascular disease, unspecified: Secondary | ICD-10-CM

## 2020-03-29 LAB — ECHOCARDIOGRAM COMPLETE
Area-P 1/2: 2.15 cm2
S' Lateral: 2.5 cm

## 2020-03-29 NOTE — Telephone Encounter (Signed)
I called the patient.  The 2D echocardiogram was unremarkable, no source of embolus was noted.  If the patient desires to have genetic testing for CADASIL syndrome, she will let me know.  2D echo 03/29/20:  IMPRESSIONS    1. Left ventricular ejection fraction, by estimation, is 60 to 65%. The left ventricle has normal function. The left ventricle has no regional wall motion abnormalities. Left ventricular diastolic parameters were low normal.  2. Right ventricular systolic function is normal. The right ventricular size is normal. Tricuspid regurgitation signal is inadequate for assessing PA pressure.  3. The mitral valve is normal in structure. Trivial mitral valve regurgitation. No evidence of mitral stenosis.  4. The aortic valve is grossly normal. There is mild calcification of the aortic valve. Aortic valve regurgitation is not visualized. No aortic stenosis is present.  5. The inferior vena cava is normal in size with greater than 50% respiratory variability, suggesting right atrial pressure of 3 mmHg.  Conclusion(s)/Recommendation(s): No intracardiac source of embolism detected on this transthoracic study. A transesophageal echocardiogram is recommended to exclude cardiac source of embolism if clinically indicated.

## 2020-03-29 NOTE — Progress Notes (Signed)
Subjective:   Patient ID: Amber [name], female   DOB: 60 y.o.   MRN: [id number]   HPI Patient continues to complain of bunion deformity and also has discomfort plantar heel right with inflammation pain noted upon palpation   ROS      Objective:  Physical Exam  Neurovascular status found to be intact muscle strength was found to be adequate with moderate significant structural bunion deformity along with inflammatory fasciitis with discomfort in the plantar heel region     Assessment:  Acute plantar fasciitis along with structural deformity     Plan:  H&P reviewed both conditions and I went ahead today and I did do sterile prep and reinjected the fascia 3 mg Kenalog 5 mg Xylocaine and for the bunion did discuss structural correction and what would be required.  Patient wants to pursue this option in future will be seen back 4 weeks to discuss in greater detail and we will see the response to medication and discussed bunion deformity which was educated on today

## 2020-04-06 ENCOUNTER — Telehealth: Payer: Self-pay | Admitting: Neurology

## 2020-04-06 NOTE — Telephone Encounter (Signed)
Pt is asking for a call from RN re: the scheduling of the genetic testing that Dr Jannifer Franklin wants her to have, please call.

## 2020-04-07 NOTE — Telephone Encounter (Signed)
Sent patient a mychart message.

## 2020-04-25 ENCOUNTER — Ambulatory Visit: Payer: 59 | Admitting: Podiatry

## 2020-04-26 ENCOUNTER — Telehealth: Payer: Self-pay

## 2020-04-26 NOTE — Telephone Encounter (Signed)
Forwarded to RN

## 2020-04-26 NOTE — Telephone Encounter (Signed)
Hi Erika Im sending this back to you because Dr. Jannifer Franklin would Have to order labs .  One of the nurses could Probably help you .  We use to have account with [facility] and [facility] [facility] . Im not sure about that now .

## 2020-04-27 ENCOUNTER — Telehealth: Payer: Self-pay | Admitting: Emergency Medicine

## 2020-04-27 ENCOUNTER — Ambulatory Visit: Payer: 59 | Admitting: Podiatry

## 2020-04-27 ENCOUNTER — Other Ambulatory Visit: Payer: Self-pay

## 2020-04-27 ENCOUNTER — Encounter: Payer: Self-pay | Admitting: Podiatry

## 2020-04-27 DIAGNOSIS — M21619 Bunion of unspecified foot: Secondary | ICD-10-CM

## 2020-04-27 DIAGNOSIS — M722 Plantar fascial fibromatosis: Secondary | ICD-10-CM | POA: Diagnosis not present

## 2020-04-27 NOTE — Progress Notes (Signed)
Subjective:   Patient ID: Amber [name], female   DOB: 60 y.o.   MRN: [id number]   HPI Patient presents stating her heels been doing quite well and her bunion still bothers her and she knows 1 point in the future she will have to have it fixed   ROS      Objective:  Physical Exam  Neurovascular status intact with inflammation of the plantar fascial which is improved with pain only upon deep palpation with patient having moderate bunion deformity right over left with slight redness noted and discomfort     Assessment:  Doing much better plantar fascial symptomatology right with moderate bunion deformity     Plan:  H&P reviewed condition recommended continued shoe gear modifications anti-inflammatories and discussed bunion correction educated her on this and she wants to get it done we will probably wait to the fall and at this point she will continue wider shoes soaks oral anti-inflammatories as needed.  When she wants surgery she will schedule and I will see her back prior to review distal osteotomy

## 2020-04-27 NOTE — Telephone Encounter (Signed)
Patient had not picked up her MyChart messages, so I called her.  She is coming in tomorrow morning to sign paperwork and have blood work done.  Patient denied further questions, verbalized understanding and expressed appreciation for the phone call.

## 2020-04-27 NOTE — Telephone Encounter (Signed)
I have filled the form out for [facility] labs, the patient will need to come in and sign the form and get the blood drawn.

## 2020-04-28 ENCOUNTER — Other Ambulatory Visit (INDEPENDENT_AMBULATORY_CARE_PROVIDER_SITE_OTHER): Payer: Self-pay

## 2020-04-28 DIAGNOSIS — Z0289 Encounter for other administrative examinations: Secondary | ICD-10-CM

## 2020-04-28 NOTE — Progress Notes (Deleted)
Office Visit Note  Patient: Amber [name]             Date of Birth: 1960-07-05           MRN: [id number]             PCP: [name], NP Referring: [name], NP Visit Date: 05/11/2020 Occupation: @GUAROCC @  Subjective:  No chief complaint on file.   History of Present Illness: Amber [name] is a 60 y.o. female ***   Activities of Daily Living:  Patient reports morning stiffness for *** {minute/hour:19697}.   Patient {ACTIONS;DENIES/REPORTS:21021675::"Denies"} nocturnal pain.  Difficulty dressing/grooming: {ACTIONS;DENIES/REPORTS:21021675::"Denies"} Difficulty climbing stairs: {ACTIONS;DENIES/REPORTS:21021675::"Denies"} Difficulty getting out of chair: {ACTIONS;DENIES/REPORTS:21021675::"Denies"} Difficulty using hands for taps, buttons, cutlery, and/or writing: {ACTIONS;DENIES/REPORTS:21021675::"Denies"}  No Rheumatology ROS completed.   PMFS History:  Patient Active Problem List   Diagnosis Date Noted  . Diabetic peripheral neuropathy ([place]) 01/05/2020  . Class 2 severe obesity with serious comorbidity and body mass index (BMI) of 38.0 to 38.9 in adult, unspecified obesity type ([place]) 01/05/2020  . Adrenal mass, right ([place]) 01/05/2020  . Other fatigue 09/04/2017  . Shortness of breath on exertion 09/04/2017  . Essential hypertension 09/04/2017  . Controlled type 2 diabetes mellitus without complication, without long-term current use of insulin ([place]) 05/31/2017  . Chronic diastolic CHF (congestive heart failure) ([place]) 04/24/2017  . CAD (coronary artery disease) 04/24/2017  . Screening-pulmonary TB 06/14/2016  . History of BCG vaccination 06/14/2016  . Fibroids 07/27/2014  . Migraine 04/19/2014  . Nail fungus 04/09/2014  . Elevated serum protein level 04/09/2014  . Preventative health care 04/09/2014  . Iron deficiency anemia 09/02/2013  . Headache(784.0) 03/04/2013  . Obesity (BMI 30-39.9) 10/17/2012  . Palpitations 10/01/2012  .  Glaucoma 11/07/2011  . Rheumatoid arthritis ([place]) 11/07/2011  . Borderline diabetes 11/07/2011  . HTN (hypertension) 11/05/2011  . Sjogren's disease ([place]) 11/05/2011  . Anemia 09/22/2010  . Fibroid uterus 09/22/2010  . Neutropenia ([place]) 09/22/2010  . Dysmenorrhea 09/15/2010    Past Medical History:  Diagnosis Date  . Anemia   . Arthritis   . Blood transfusion without reported diagnosis   . Colon polyps   . Controlled type 2 diabetes mellitus without complication, without long-term current use of insulin ([place]) 05/31/2017  . Ectopic pregnancy    RIGHT salpingostomy  . Elective abortion   . GERD (gastroesophageal reflux disease)   . Glaucoma    both eyes  . Headache    migraines  . History of chicken pox   . Hyperlipemia   . Hypertension   . Rheumatoid arthritis(714.0) 11/07/2011  . SAB (spontaneous abortion)   . Shortness of breath   . Sjogren's disease ([place])   . Wheezing     Family History  Problem Relation Age of Onset  . Hypertension Mother        died from heart disease  . Heart disease Mother        deceased, unknown cause  . Stroke Mother   . Obesity Mother   . Diabetes Sister   . Hypertension Sister   . Esophageal cancer Sister   . Stomach cancer Sister   . Cancer Sister   . Diabetes Sister   . Rectal cancer Neg Hx   . Colon cancer Neg Hx    Past Surgical History:  Procedure Laterality Date  . ABDOMINAL SURGERY     bowel repair  . COLONOSCOPY    . DILATION AND CURETTAGE OF UTERUS     X  2  . MENISCUS REPAIR Right    MENISCUS TEAR REPAIR  . MYOMECTOMY N/A 07/27/2014   Procedure: ABDOMINAL MYOMECTOMY;  Surgeon: Waymon Amato, MD;  Location: [facility];  Service: Gynecology;  Laterality: N/A;  . PELVIC LAPAROSCOPY  1994   IN 1999 LAPAROSCOPY WITH INCIDENTAL ENTEROTOMY REQUIRING LAPAROTOMY  . 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  Had a sister up in [place] who passed away 2015/04/19 who she was close to.   Born in [place]- she came to [place] as adult.  Age 72.    Left-handed.         Immunization History  Administered Date(s) Administered  . Hepatitis A, Adult 11/01/2017, 12/03/2018  . IPV 11/01/2017  . Influenza Split 11/07/2011  . Influenza,inj,Quad PF,6+ Mos 10/17/2012, 01/15/2014, 08/31/2014, 09/30/2017, 09/10/2018, 09/21/2019  . Meningococcal Mcv4o 11/01/2017  . Moderna Sars-Covid-2 Vaccination 01/28/2019, 02/27/2019, 11/27/2019  . Pneumococcal Polysaccharide-23 09/10/2018  . Tdap 06/01/2011, 11/25/2012  . Typhoid Live 11/01/2017  . Zoster Recombinat (Shingrix) 10/04/2017, 12/04/2017     Objective: Vital Signs: LMP 08/09/2016    Physical Exam   Musculoskeletal Exam: ***  CDAI Exam: CDAI Score: -- Patient Global: --; Provider Global: -- Swollen: --; Tender: -- Joint Exam 05/11/2020   No joint exam has been documented for this visit   There is currently no information documented on the homunculus. Go to the Rheumatology activity and complete the homunculus joint exam.  Investigation: No additional findings.  Imaging: No results found.  Recent Labs: Lab Results  Component Value Date   WBC 5.4 03/22/2020   HGB 13.8 03/22/2020   PLT 258 03/22/2020   NA 135 03/22/2020   K 3.5 03/22/2020   CL 97 (L) 03/22/2020   CO2 29 03/22/2020   GLUCOSE 138 (H) 03/22/2020   BUN 16 03/22/2020   CREATININE 0.80 03/22/2020   BILITOT 0.6 02/19/2020   ALKPHOS 62 09/10/2018   AST 21 02/19/2020   ALT 20 02/19/2020   PROT 8.8 (H) 02/19/2020   ALBUMIN 4.0 09/10/2018   CALCIUM 9.3 03/22/2020   GFRAA 115 02/19/2020   QFTBGOLD Negative 06/14/2016   QFTBGOLDPLUS NEGATIVE 01/20/2020    Speciality Comments: PLQ eye exam: 12/09/2018 normal. [facility].  Procedures:  No procedures  performed Allergies: Patient has no known allergies.   Assessment / Plan:     Visit Diagnoses: No diagnosis found.  Orders: No orders of the defined types were placed in this encounter.  No orders of the defined types were placed in this encounter.   Face-to-face time spent with patient was *** minutes. Greater than 50% of time was spent in counseling and coordination of care.  Follow-Up Instructions: No follow-ups on file.   Earnestine Mealing, CMA  Note - This record has been created using Editor, commissioning.  Chart creation errors have been sought, but may not always  have been located. Such creation errors do not reflect on  the standard of medical care.

## 2020-04-29 ENCOUNTER — Other Ambulatory Visit: Payer: Self-pay

## 2020-04-29 ENCOUNTER — Inpatient Hospital Stay: Payer: 59 | Attending: Oncology

## 2020-04-29 ENCOUNTER — Inpatient Hospital Stay (HOSPITAL_BASED_OUTPATIENT_CLINIC_OR_DEPARTMENT_OTHER): Payer: 59 | Admitting: Oncology

## 2020-04-29 VITALS — BP 164/90 | HR 73 | Temp 98.2°F | Resp 18 | Ht 61.0 in | Wt 215.7 lb

## 2020-04-29 DIAGNOSIS — D259 Leiomyoma of uterus, unspecified: Secondary | ICD-10-CM | POA: Insufficient documentation

## 2020-04-29 DIAGNOSIS — M25519 Pain in unspecified shoulder: Secondary | ICD-10-CM | POA: Diagnosis not present

## 2020-04-29 DIAGNOSIS — D709 Neutropenia, unspecified: Secondary | ICD-10-CM | POA: Insufficient documentation

## 2020-04-29 DIAGNOSIS — D509 Iron deficiency anemia, unspecified: Secondary | ICD-10-CM | POA: Diagnosis not present

## 2020-04-29 DIAGNOSIS — M069 Rheumatoid arthritis, unspecified: Secondary | ICD-10-CM | POA: Insufficient documentation

## 2020-04-29 DIAGNOSIS — D508 Other iron deficiency anemias: Secondary | ICD-10-CM

## 2020-04-29 DIAGNOSIS — M542 Cervicalgia: Secondary | ICD-10-CM | POA: Diagnosis not present

## 2020-04-29 DIAGNOSIS — Z79899 Other long term (current) drug therapy: Secondary | ICD-10-CM | POA: Insufficient documentation

## 2020-04-29 LAB — CBC WITH DIFFERENTIAL/PLATELET
Abs Immature Granulocytes: 0.01 10*3/uL (ref 0.00–0.07)
Basophils Absolute: 0 10*3/uL (ref 0.0–0.1)
Basophils Relative: 0 %
Eosinophils Absolute: 0.1 10*3/uL (ref 0.0–0.5)
Eosinophils Relative: 2 %
HCT: 38.4 % (ref 36.0–46.0)
Hemoglobin: 12.6 g/dL (ref 12.0–15.0)
Immature Granulocytes: 0 %
Lymphocytes Relative: 62 %
Lymphs Abs: 2.5 10*3/uL (ref 0.7–4.0)
MCH: 28.2 pg (ref 26.0–34.0)
MCHC: 32.8 g/dL (ref 30.0–36.0)
MCV: 85.9 fL (ref 80.0–100.0)
Monocytes Absolute: 0.2 10*3/uL (ref 0.1–1.0)
Monocytes Relative: 6 %
Neutro Abs: 1.2 10*3/uL — ABNORMAL LOW (ref 1.7–7.7)
Neutrophils Relative %: 30 %
Platelets: 232 10*3/uL (ref 150–400)
RBC: 4.47 MIL/uL (ref 3.87–5.11)
RDW: 13.8 % (ref 11.5–15.5)
WBC: 4.1 10*3/uL (ref 4.0–10.5)
nRBC: 0 % (ref 0.0–0.2)

## 2020-04-29 NOTE — Progress Notes (Signed)
Hematology and Oncology Follow Up Visit  Amber [name] [id number] [birth year] 60 y.o. 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  Bone marrow biopsy in 2015 showed no evidence of hematological disorder.  Secondary diagnosis: Iron deficiency anemia related to her uterine fibroids and chronic menstrual losses noted in 2017.     Prior Therapy:  She is status post IV iron infusion in the form of Feraheme given on multiple occasions and repeated as needed.  Last treatment was in March 2019.  Current therapy: Iron sulfate 325 mg by mouth daily.  She takes it intermittently.  Interim History: Amber [name] returns for repeat evaluation.  Since the last visit, she reports no major changes in her health.  She reports her shoulder and neck pain that she has developed during work related duties.  He denies any hematochezia, melena or hemoptysis.  She denies any excessive fatigue or tiredness.  He denies any recurrent infections.  Her rheumatoid arthritis is under reasonable control with Orencia.       Medications: Updated on review. Current Outpatient Medications  Medication Sig Dispense Refill  . Abatacept (ORENCIA CLICKJECT) 563 MG/ML SOAJ Inject 183m into the skin every 10 days. Copay card: [id number] Grp: [id number] PCN: [facility]; ID: [id number] mL 2  . albuterol (VENTOLIN HFA) 108 (90 Base) MCG/ACT inhaler Inhale 2 puffs into the lungs every 6 (six) hours as needed for wheezing or shortness of breath. 18 g 5  . amLODipine (NORVASC) 5 MG tablet Take 1 tablet (5 mg total) by mouth daily. 30 tablet 3  . aspirin EC 81 MG tablet Take 1 tablet (81 mg total) by mouth daily. 90 tablet 3  . blood glucose meter kit and supplies KIT Dispense based on patient and insurance preference. Use up to four times daily as directed. (FOR ICD-9 250.00, 250.01). 1 each 0  . Blood Pressure Monitoring (ADULT BLOOD PRESSURE CUFF LG)  KIT Use as directed 1 kit 0  . calcium-vitamin D (OSCAL WITH D) 500-200 MG-UNIT TABS tablet Take by mouth.    . Cyanocobalamin (VITAMIN B-12) 5000 MCG LOZG Take 1 tablet by mouth 2 (two) times daily.    . diclofenac (VOLTAREN) 75 MG EC tablet Take 1 tablet (75 mg total) by mouth 2 (two) times daily. 50 tablet 2  . ferrous sulfate 325 (65 FE) MG tablet Take 325 mg by mouth 3 (three) times daily with meals.    . Fluticasone-Salmeterol (ADVAIR) 250-50 MCG/DOSE AEPB Inhale 1 puff into the lungs 2 (two) times daily. 60 each 5  . folic acid (FOLVITE) 1 MG tablet Take 1 tablet (1 mg total) by mouth daily. 90 tablet 0  . furosemide (LASIX) 20 MG tablet Take 1 tablet (20 mg total) by mouth daily as needed for fluid. 90 tablet 1  . hydrochlorothiazide (HYDRODIURIL) 25 MG tablet Take 1 tablet (25 mg total) by mouth daily. (Patient taking differently: Take 25 mg by mouth as needed.) 90 tablet 1  . Lifitegrast (XIIDRA) 5 % SOLN Place 1 drop into both eyes 2 times daily.    .Marland Kitchenlisinopril (ZESTRIL) 40 MG tablet Take 1 tablet (40 mg total) by mouth daily. 90 tablet 1  . metoCLOPramide (REGLAN) 10 MG tablet Take 1 tablet (10 mg total) by mouth every 8 (eight) hours as needed for nausea (nausea/headache). 6 tablet 0  . metoprolol succinate (TOPROL-XL) 100 MG 24 hr tablet Take 1 tablet (100 mg total) by mouth daily.  Take with or immediately following a meal. 90 tablet 1  . predniSONE (DELTASONE) 10 MG tablet 12 day tapering dose (Patient not taking: Reported on 03/09/2020) 48 tablet 0  . predniSONE (STERAPRED UNI-PAK 48 TAB) 10 MG (48) TBPK tablet Take by mouth as directed.    . SUMAtriptan (IMITREX) 50 MG tablet TAKE 1 TABLET BY MOUTH EVERY 2 HOURS AS NEEDED FOR  MIGRAINE  MAY  REPEAT  IN  2  HOURS  IF  HEADACHE  PERSISTS  OR  RECURS 9 tablet 5  . TRAVATAN Z 0.004 % SOLN ophthalmic solution Place 1 drop into both eyes at bedtime.      No current facility-administered medications for this visit.     Allergies: No  Known Allergies     Physical Exam:    Blood pressure (!) 164/90, pulse 73, temperature 98.2 F (36.8 C), temperature source Tympanic, resp. rate 18, height _0  (1.549 m), weight 215 lb 11.2 oz (97.8 kg), last menstrual period 08/09/2016, SpO2 100 %.      ECOG: 0    General appearance: Alert, awake without any distress. Head: Atraumatic without abnormalities Oropharynx: Without any thrush or ulcers. Eyes: No scleral icterus. Lymph nodes: No lymphadenopathy noted in the cervical, supraclavicular, or axillary nodes Heart:regular rate and rhythm, without any murmurs or gallops.   Lung: Clear to auscultation without any rhonchi, wheezes or dullness to percussion. Abdomin: Soft, nontender without any shifting dullness or ascites. Musculoskeletal: No clubbing or cyanosis. Neurological: No motor or sensory deficits. Skin: No rashes or lesions.         Lab Results: Lab Results  Component Value Date   WBC 5.4 03/22/2020   HGB 13.8 03/22/2020   HCT 42.9 03/22/2020   MCV 87.7 03/22/2020   PLT 258 03/22/2020     Chemistry      Component Value Date/Time   NA 135 03/22/2020 0059   NA 140 09/04/2017 1124   NA 138 01/21/2015 0935   K 3.5 03/22/2020 0059   K 3.9 01/21/2015 0935   CL 97 (L) 03/22/2020 0059   CL 108 (H) 06/25/2012 1508   CO2 29 03/22/2020 0059   CO2 25 01/21/2015 0935   BUN 16 03/22/2020 0059   BUN 10 09/04/2017 1124   BUN 7.0 01/21/2015 0935   CREATININE 0.80 03/22/2020 0059   CREATININE 0.63 02/19/2020 1339   CREATININE 0.8 01/21/2015 0935      Component Value Date/Time   CALCIUM 9.3 03/22/2020 0059   CALCIUM 9.4 01/21/2015 0935   ALKPHOS 62 09/10/2018 1040   ALKPHOS 54 01/21/2015 0935   AST 21 02/19/2020 1339   AST 17 01/21/2015 0935   ALT 20 02/19/2020 1339   ALT 15 01/21/2015 0935   BILITOT 0.6 02/19/2020 1339   BILITOT 1.0 09/04/2017 1124   BILITOT 0.55 01/21/2015 0935       Impression and Plan:  60 year old woman with:      1.  Benign fluctuating neutropenia diagnosed in 2015.     The differential diagnosis and management options of this condition were discussed at this time.  Bone marrow biopsy obtained in 2015 was personally reviewed and discussed again today.  The etiology of her neutropenia appears to be benign in nature and likely autoimmune related.  CBC obtained on March 22, 2020 showed normal white cell count with absolute neutrophil count within normal range.   Repeat CBC from today shows similar findings with white cell count of 4.1 and absolute neutrophil count of 1200.  At this time I recommended continued active surveillance without any need for intervention.  2.  Iron deficiency anemia related to chronic menstrual blood losses.  She is status post IV iron infusion in the past which has corrected her iron deficiency.  Her hemoglobin on March 22 was normal.  We will update her iron studies and replace with iron intravenously or orally if needed.  Hemoglobin today is within normal range at 12.6 with iron studies currently pending.   3. Rheumatoid arthritis: Reasonably controlled with Orencia.    6. Followup: I am happy to see her in the future as needed.  30  minutes were spent on this visit.  The time was dedicated to reviewing laboratory data, differential diagnosis and management options for the future.     Zola Button 4/29/20222:49 PM

## 2020-05-03 ENCOUNTER — Ambulatory Visit: Payer: 59 | Admitting: Family

## 2020-05-03 ENCOUNTER — Encounter: Payer: Self-pay | Admitting: Family

## 2020-05-03 ENCOUNTER — Other Ambulatory Visit: Payer: Self-pay

## 2020-05-03 VITALS — BP 138/88 | HR 62 | Temp 98.2°F | Resp 18 | Ht 61.0 in | Wt 210.0 lb

## 2020-05-03 DIAGNOSIS — I10 Essential (primary) hypertension: Secondary | ICD-10-CM

## 2020-05-03 DIAGNOSIS — J45909 Unspecified asthma, uncomplicated: Secondary | ICD-10-CM

## 2020-05-03 DIAGNOSIS — E785 Hyperlipidemia, unspecified: Secondary | ICD-10-CM | POA: Diagnosis not present

## 2020-05-03 DIAGNOSIS — M0579 Rheumatoid arthritis with rheumatoid factor of multiple sites without organ or systems involvement: Secondary | ICD-10-CM

## 2020-05-03 DIAGNOSIS — D508 Other iron deficiency anemias: Secondary | ICD-10-CM

## 2020-05-03 DIAGNOSIS — E119 Type 2 diabetes mellitus without complications: Secondary | ICD-10-CM

## 2020-05-03 DIAGNOSIS — E538 Deficiency of other specified B group vitamins: Secondary | ICD-10-CM

## 2020-05-03 DIAGNOSIS — E611 Iron deficiency: Secondary | ICD-10-CM | POA: Diagnosis not present

## 2020-05-03 HISTORY — DX: Unspecified asthma, uncomplicated: J45.909

## 2020-05-03 LAB — LIPID PANEL
Cholesterol: 161 mg/dL (ref 0–200)
HDL: 56.3 mg/dL (ref >39.00)
LDL Cholesterol: 91 mg/dL (ref 0–99)
NonHDL: 104.59
Total CHOL/HDL Ratio: 3
Triglycerides: 68 mg/dL (ref 0.0–149.0)
VLDL: 13.6 mg/dL (ref 0.0–40.0)

## 2020-05-03 LAB — VITAMIN B12: Vitamin B-12: 433 pg/mL (ref 211–911)

## 2020-05-03 LAB — HEMOGLOBIN A1C: Hgb A1c MFr Bld: 6.5 % (ref 4.6–6.5)

## 2020-05-03 LAB — IRON: Iron: 94 ug/dL (ref 42–145)

## 2020-05-03 LAB — FERRITIN: Ferritin: 52.7 ng/mL (ref 10.0–291.0)

## 2020-05-03 MED ORDER — METOPROLOL SUCCINATE ER 100 MG PO TB24: 100.0000 mg | ORAL_TABLET | Freq: Every day | ORAL | 1 refills | Status: DC

## 2020-05-03 MED ORDER — SUMATRIPTAN SUCCINATE 50 MG PO TABS: ORAL_TABLET | ORAL | 5 refills | Status: DC

## 2020-05-03 MED ORDER — AMLODIPINE BESYLATE 5 MG PO TABS: 5.0000 mg | ORAL_TABLET | Freq: Every day | ORAL | 1 refills | Status: DC

## 2020-05-03 MED ORDER — HYDROCHLOROTHIAZIDE 25 MG PO TABS: 25.0000 mg | ORAL_TABLET | Freq: Every day | ORAL | 1 refills | Status: DC

## 2020-05-03 MED ORDER — LISINOPRIL 40 MG PO TABS: 40.0000 mg | ORAL_TABLET | Freq: Every day | ORAL | 1 refills | Status: DC

## 2020-05-03 MED ORDER — METHOCARBAMOL 500 MG PO TABS: 500.0000 mg | ORAL_TABLET | Freq: Three times a day (TID) | ORAL | 1 refills | Status: DC | PRN

## 2020-05-03 NOTE — Assessment & Plan Note (Signed)
BP looks good today. Recommended that she take hctz 25mg  every day and use lasix as needed.  I think her BP will be better controlled with daily use of hctz.

## 2020-05-03 NOTE — Assessment & Plan Note (Signed)
Lab Results  Component Value Date   WBC 4.1 04/29/2020   HGB 12.6 04/29/2020   HCT 38.4 04/29/2020   MCV 85.9 04/29/2020   PLT 232 04/29/2020   H/H normal. Will check iron levels.

## 2020-05-03 NOTE — Assessment & Plan Note (Signed)
Lab Results  Component Value Date   CHOL 139 09/21/2019   HDL 48 (L) 09/21/2019   LDLCALC 73 09/21/2019   TRIG 93 09/21/2019   CHOLHDL 2.9 09/21/2019   Will obtain follow up lipid panel.  Not currently on statin.

## 2020-05-03 NOTE — Assessment & Plan Note (Signed)
Stable on advair 250mg and prn albuterol. Continue same.  

## 2020-05-03 NOTE — Progress Notes (Signed)
Established Patient Office Visit  Subjective:  Patient ID: Amber [name], female    DOB: April 25, 1960  Age: 60 y.o. MRN: [id number]  CC:  Chief Complaint  Patient presents with  . Hypertension    3 month follow up    HPI [place] D Reddick presents for follow up.  HTN- maintained on hctz 55m and amlodipine 563m  She reports that she took her medication late on 4/29.   BP Readings from Last 3 Encounters:  05/03/20 138/88  04/29/20 (!) 164/90  03/22/20 (!) 176/99   Asthma- reports stable on advair 250. Rarely needing albuterol.   Iron deficiency- reports that she takes iron occasionally- takes about 3x a week.  Lab Results  Component Value Date   WBC 4.1 04/29/2020   HGB 12.6 04/29/2020   HCT 38.4 04/29/2020   MCV 85.9 04/29/2020   PLT 232 04/29/2020   She is following with workman's comp due to neck pain.  Had MRI and noted bulging disc.  She plans to see neurosurgery and PT.   She is following with rheumatology for RA, She was recently switched to orencia and is feeling better.    Lab Results  Component Value Date   HGBA1C 6.2 01/05/2020   HGBA1C 6.0 (H) 09/21/2019   HGBA1C 6.3 09/10/2018   Lab Results  Component Value Date   MICROALBUR 3.20 (H) 11/07/2011   LDLCALC 73 09/21/2019   CREATININE 0.80 03/22/2020   Migraines- reports that she is having migraines more frequently since she hurt her neck at work. Flexeril makes her "like a zombie."    Past Medical History:  Diagnosis Date  . Anemia   . Arthritis   . Asthma 05/03/2020  . Blood transfusion without reported diagnosis   . Colon polyps   . Controlled type 2 diabetes mellitus without complication, without long-term current use of insulin ([facility]/31/2019  . Ectopic pregnancy    RIGHT salpingostomy  . Elective abortion   . GERD (gastroesophageal reflux disease)   . Glaucoma    both eyes  . Headache    migraines  . History of chicken pox   . Hyperlipemia   . Hypertension   . Rheumatoid  arthritis(714.0) 11/07/2011  . SAB (spontaneous abortion)   . Shortness of breath   . Sjogren's disease ([facility] [place]  . Wheezing     Past Surgical History:  Procedure Laterality Date  . ABDOMINAL SURGERY     bowel repair  . COLONOSCOPY    . DILATION AND CURETTAGE OF UTERUS     X 2  . MENISCUS REPAIR Right    MENISCUS TEAR REPAIR  . MYOMECTOMY N/A 07/27/2014   Procedure: ABDOMINAL MYOMECTOMY;  Surgeon: EmWaymon AmatoMD;  [facility];  Service: Gynecology;  Laterality: N/A;  . PELVIC LAPAROSCOPY  1994   IN 1999 LAPAROSCOPY WITH INCIDENTAL ENTEROTOMY REQUIRING LAPAROTOMY  . UTERINE FIBROID SURGERY     July 2016    Family History  Problem Relation Age of Onset  . Hypertension Mother        died from heart disease  . Heart disease Mother        deceased, unknown cause  . Stroke Mother   . Obesity Mother   . Diabetes Sister   . Hypertension Sister   . Esophageal cancer Sister   . Stomach cancer Sister   . Cancer Sister   . Diabetes Sister   . Rectal cancer Neg Hx   . Colon cancer Neg Hx  Social History   Socioeconomic History  . Marital status: Single    Spouse name: Not on file  . Number of children: 0  . Years of education: college  . Highest education level: Not on file  Occupational History  . Occupation: LPN    Employer: [employer]  Tobacco Use  . Smoking status: Never Smoker  . Smokeless tobacco: Never Used  Vaping Use  . Vaping Use: Never used  Substance and Sexual Activity  . Alcohol use: No    Alcohol/week: 0.0 standard drinks  . Drug use: No  . Sexual activity: Yes    Birth control/protection: None    Comment: 1st intercourse- 17, partners- 5  Other Topics Concern  . 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  Had a sister up in [place] who passed away 02-28-15 who  she was close to.   Born in [place]- she came to [place] as adult.  Age 80.    Left-handed.         Social Determinants of Health   Financial Resource Strain: Not on file  Food Insecurity: Not on file  Transportation Needs: Not on file  Physical Activity: Not on file  Stress: Not on file  Social Connections: Not on file  Intimate Partner Violence: Not on file    Outpatient Medications Prior to Visit  Medication Sig Dispense Refill  . Abatacept (ORENCIA CLICKJECT) 024 MG/ML SOAJ Inject 142m into the skin every 10 days. Copay card: [id number] Grp: [id number] PCN: [facility]; ID: [id number] mL 2  . albuterol (VENTOLIN HFA) 108 (90 Base) MCG/ACT inhaler Inhale 2 puffs into the lungs every 6 (six) hours as needed for wheezing or shortness of breath. 18 g 5  . aspirin EC 81 MG tablet Take 1 tablet (81 mg total) by mouth daily. 90 tablet 3  . blood glucose meter kit and supplies KIT Dispense based on patient and insurance preference. Use up to four times daily as directed. (FOR ICD-9 250.00, 250.01). 1 each 0  . Blood Pressure Monitoring (ADULT BLOOD PRESSURE CUFF LG) KIT Use as directed 1 kit 0  . calcium-vitamin D (OSCAL WITH D) 500-200 MG-UNIT TABS tablet Take by mouth.    . Cyanocobalamin (VITAMIN B-12) 5000 MCG LOZG Take 1 tablet by mouth 2 (two) times daily.    . diclofenac (VOLTAREN) 75 MG EC tablet Take 1 tablet (75 mg total) by mouth 2 (two) times daily. 50 tablet 2  . ferrous sulfate 325 (65 FE) MG tablet Take 325 mg by mouth 3 (three) times daily with meals.    . Fluticasone-Salmeterol (ADVAIR) 250-50 MCG/DOSE AEPB Inhale 1 puff into the lungs 2 (two) times daily. 60 each 5  . folic acid (FOLVITE) 1 MG tablet Take 1 tablet (1 mg total) by mouth daily. 90 tablet 0  . furosemide (LASIX) 20 MG tablet Take 1 tablet (20 mg total) by mouth daily as needed for fluid. 90 tablet 1  . Lifitegrast (XIIDRA) 5 % SOLN Place 1 drop into both eyes 2 times daily.    . TRAVATAN Z 0.004 % SOLN ophthalmic  solution Place 1 drop into both eyes at bedtime.     .Marland KitchenamLODipine (NORVASC) 5 MG tablet Take 1 tablet (5 mg total) by mouth  daily. 30 tablet 3  . hydrochlorothiazide (HYDRODIURIL) 25 MG tablet Take 1 tablet (25 mg total) by mouth daily. (Patient taking differently: Take 25 mg by mouth as needed.) 90 tablet 1  . lisinopril (ZESTRIL) 40 MG tablet Take 1 tablet (40 mg total) by mouth daily. 90 tablet 1  . metoCLOPramide (REGLAN) 10 MG tablet Take 1 tablet (10 mg total) by mouth every 8 (eight) hours as needed for nausea (nausea/headache). 6 tablet 0  . metoprolol succinate (TOPROL-XL) 100 MG 24 hr tablet Take 1 tablet (100 mg total) by mouth daily. Take with or immediately following a meal. 90 tablet 1  . predniSONE (DELTASONE) 10 MG tablet 12 day tapering dose (Patient not taking: Reported on 03/09/2020) 48 tablet 0  . predniSONE (STERAPRED UNI-PAK 48 TAB) 10 MG (48) TBPK tablet Take by mouth as directed.    . SUMAtriptan (IMITREX) 50 MG tablet TAKE 1 TABLET BY MOUTH EVERY 2 HOURS AS NEEDED FOR  MIGRAINE  MAY  REPEAT  IN  2  HOURS  IF  HEADACHE  PERSISTS  OR  RECURS 9 tablet 5   No facility-administered medications prior to visit.    No Known Allergies  ROS Review of Systems    Objective:    Physical Exam Constitutional:      Appearance: She is well-developed.  Cardiovascular:     Rate and Rhythm: Normal rate and regular rhythm.     Heart sounds: Normal heart sounds. No murmur heard.   Pulmonary:     Effort: Pulmonary effort is normal. No respiratory distress.     Breath sounds: Normal breath sounds. No wheezing.  Musculoskeletal:     Right lower leg: 2+ Edema present.     Left lower leg: 2+ Edema present.  Psychiatric:        Behavior: Behavior normal.        Thought Content: Thought content normal.        Judgment: Judgment normal.     BP 138/88 (BP Location: Right Arm, Patient Position: Sitting, Cuff Size: Large)   Pulse 62   Temp 98.2 F (36.8 C)   Resp 18   Ht 5' 1"   (1.549 m)   Wt 210 lb (95.3 kg)   LMP 08/09/2016   SpO2 100%   BMI 39.68 kg/m  Wt Readings from Last 3 Encounters:  05/03/20 210 lb (95.3 kg)  04/29/20 215 lb 11.2 oz (97.8 kg)  03/22/20 211 lb 10.3 oz (96 kg)     There are no preventive care reminders to display for this patient.  There are no preventive care reminders to display for this patient.  Lab Results  Component Value Date   TSH 1.47 09/10/2018   Lab Results  Component Value Date   WBC 4.1 04/29/2020   HGB 12.6 04/29/2020   HCT 38.4 04/29/2020   MCV 85.9 04/29/2020   PLT 232 04/29/2020   Lab Results  Component Value Date   NA 135 03/22/2020   K 3.5 03/22/2020   CHLORIDE 108 01/21/2015   CO2 29 03/22/2020   GLUCOSE 138 (H) 03/22/2020   BUN 16 03/22/2020   CREATININE 0.80 03/22/2020   BILITOT 0.6 02/19/2020   ALKPHOS 62 09/10/2018   AST 21 02/19/2020   ALT 20 02/19/2020   PROT 8.8 (H) 02/19/2020   ALBUMIN 4.0 09/10/2018   CALCIUM 9.3 03/22/2020   ANIONGAP 9 03/22/2020   EGFR >90 01/21/2015   GFR 113.60 09/10/2018   Lab Results  Component Value Date  CHOL 139 09/21/2019   Lab Results  Component Value Date   HDL 48 (L) 09/21/2019   Lab Results  Component Value Date   LDLCALC 73 09/21/2019   Lab Results  Component Value Date   TRIG 93 09/21/2019   Lab Results  Component Value Date   CHOLHDL 2.9 09/21/2019   Lab Results  Component Value Date   HGBA1C 6.2 01/05/2020      Assessment & Plan:   Problem List Items Addressed This Visit      Unprioritized   Rheumatoid arthritis ([place])    Reports improvement in her symptoms with orencia- management per rheumatology.       Relevant Medications   methocarbamol (ROBAXIN) 500 MG tablet   Hyperlipidemia    Lab Results  Component Value Date   CHOL 139 09/21/2019   HDL 48 (L) 09/21/2019   LDLCALC 73 09/21/2019   TRIG 93 09/21/2019   CHOLHDL 2.9 09/21/2019   Will obtain follow up lipid panel.  Not currently on statin.         Relevant Medications   metoprolol succinate (TOPROL-XL) 100 MG 24 hr tablet   amLODipine (NORVASC) 5 MG tablet   hydrochlorothiazide (HYDRODIURIL) 25 MG tablet   lisinopril (ZESTRIL) 40 MG tablet   Other Relevant Orders   Lipid panel   Essential hypertension    BP looks good today. Recommended that she take hctz 52m every day and use lasix as needed.  I think her BP will be better controlled with daily use of hctz.       Relevant Medications   metoprolol succinate (TOPROL-XL) 100 MG 24 hr tablet   amLODipine (NORVASC) 5 MG tablet   hydrochlorothiazide (HYDRODIURIL) 25 MG tablet   lisinopril (ZESTRIL) 40 MG tablet   Controlled type 2 diabetes mellitus without complication, without long-term current use of insulin (HCC)    Clinically stable. Obtain follow up A1C.       Relevant Medications   lisinopril (ZESTRIL) 40 MG tablet   Other Relevant Orders   Hemoglobin A1c   B12 deficiency   Relevant Orders   Vitamin B12   Asthma    Stable on advair 2534mand prn albuterol. Continue same.      Anemia    Lab Results  Component Value Date   WBC 4.1 04/29/2020   HGB 12.6 04/29/2020   HCT 38.4 04/29/2020   MCV 85.9 04/29/2020   PLT 232 04/29/2020   H/H normal. Will check iron levels.        Other Visit Diagnoses    Iron deficiency    -  Primary   Relevant Orders   Iron   Ferritin      Meds ordered this encounter  Medications  . metoprolol succinate (TOPROL-XL) 100 MG 24 hr tablet    Sig: Take 1 tablet (100 mg total) by mouth daily. Take with or immediately following a meal.    Dispense:  90 tablet    Refill:  1  . amLODipine (NORVASC) 5 MG tablet    Sig: Take 1 tablet (5 mg total) by mouth daily.    Dispense:  90 tablet    Refill:  1  . hydrochlorothiazide (HYDRODIURIL) 25 MG tablet    Sig: Take 1 tablet (25 mg total) by mouth daily.    Dispense:  90 tablet    Refill:  1  . lisinopril (ZESTRIL) 40 MG tablet    Sig: Take 1 tablet (40 mg total) by mouth daily.  Dispense:  90 tablet    Refill:  1  . SUMAtriptan (IMITREX) 50 MG tablet    Sig: TAKE 1 TABLET BY MOUTH EVERY 2 HOURS AS NEEDED FOR  MIGRAINE  MAY  REPEAT  IN  2  HOURS  IF  HEADACHE  PERSISTS  OR  RECURS    Dispense:  9 tablet    Refill:  5  . methocarbamol (ROBAXIN) 500 MG tablet    Sig: Take 1 tablet (500 mg total) by mouth every 8 (eight) hours as needed for muscle spasms.    Dispense:  30 tablet    Refill:  1    Order Specific Question:   Supervising Provider    Answer:   Penni Homans A [[id number]]    Follow-up: Return in about 3 months (around 08/03/2020) for follow up visit.    [name], NP

## 2020-05-03 NOTE — Assessment & Plan Note (Signed)
Clinically stable.  Obtain follow up A1C.  

## 2020-05-03 NOTE — Assessment & Plan Note (Signed)
Reports improvement in her symptoms with orencia- management per rheumatology.

## 2020-05-04 ENCOUNTER — Other Ambulatory Visit: Payer: Self-pay | Admitting: Physician Assistant

## 2020-05-04 DIAGNOSIS — M0579 Rheumatoid arthritis with rheumatoid factor of multiple sites without organ or systems involvement: Secondary | ICD-10-CM

## 2020-05-04 NOTE — Telephone Encounter (Signed)
Next Visit: 05/11/2020  Last Visit: 03/09/2020  Last Fill: 03/09/2020,   DX: Rheumatoid arthritis involving multiple sites with positive rheumatoid factor   Current Dose per office note 03/09/2020, Orencia 125 mg subcutaneous every 10 days  Labs: 04/29/2020, Neutro Abs 1.2, 03/22/2020 BMP Chloride 97, Glucose 138,   TB Gold: 01/20/2020, negative  Okay to refill Orencia?

## 2020-05-06 ENCOUNTER — Other Ambulatory Visit: Payer: Self-pay | Admitting: *Deleted

## 2020-05-06 ENCOUNTER — Telehealth: Payer: Self-pay

## 2020-05-06 DIAGNOSIS — M0579 Rheumatoid arthritis with rheumatoid factor of multiple sites without organ or systems involvement: Secondary | ICD-10-CM

## 2020-05-06 MED ORDER — ORENCIA CLICKJECT 125 MG/ML ~~LOC~~ SOAJ: SUBCUTANEOUS | 1 refills | Status: DC

## 2020-05-06 NOTE — Telephone Encounter (Signed)
Orencia comes in a box of 4, RX changed from 3 to 4 verbal, no print.

## 2020-05-06 NOTE — Telephone Encounter (Signed)
Anga with [facility] left a voicemail regarding patient's Orencia prescription that was sent in.  We do need to speak to you about it before we can dispense it   Please call [phone] option 2

## 2020-05-11 ENCOUNTER — Ambulatory Visit: Payer: 59 | Admitting: Physician Assistant

## 2020-05-11 DIAGNOSIS — E119 Type 2 diabetes mellitus without complications: Secondary | ICD-10-CM

## 2020-05-11 DIAGNOSIS — I5032 Chronic diastolic (congestive) heart failure: Secondary | ICD-10-CM

## 2020-05-11 DIAGNOSIS — M722 Plantar fascial fibromatosis: Secondary | ICD-10-CM

## 2020-05-11 DIAGNOSIS — M0579 Rheumatoid arthritis with rheumatoid factor of multiple sites without organ or systems involvement: Secondary | ICD-10-CM

## 2020-05-11 DIAGNOSIS — Z8261 Family history of arthritis: Secondary | ICD-10-CM

## 2020-05-11 DIAGNOSIS — Z8669 Personal history of other diseases of the nervous system and sense organs: Secondary | ICD-10-CM

## 2020-05-11 DIAGNOSIS — Z79899 Other long term (current) drug therapy: Secondary | ICD-10-CM

## 2020-05-11 DIAGNOSIS — I10 Essential (primary) hypertension: Secondary | ICD-10-CM

## 2020-05-11 DIAGNOSIS — R002 Palpitations: Secondary | ICD-10-CM

## 2020-05-11 DIAGNOSIS — M3509 Sicca syndrome with other organ involvement: Secondary | ICD-10-CM

## 2020-05-11 DIAGNOSIS — R5383 Other fatigue: Secondary | ICD-10-CM

## 2020-05-11 DIAGNOSIS — I2510 Atherosclerotic heart disease of native coronary artery without angina pectoris: Secondary | ICD-10-CM

## 2020-05-11 DIAGNOSIS — M170 Bilateral primary osteoarthritis of knee: Secondary | ICD-10-CM

## 2020-05-11 DIAGNOSIS — M19071 Primary osteoarthritis, right ankle and foot: Secondary | ICD-10-CM

## 2020-05-11 NOTE — Progress Notes (Signed)
Office Visit Note  Patient: Amber [name]             Date of Birth: [birth year]           MRN: [id number]             PCP: [name], NP Referring: [name], NP Visit Date: 05/25/2020 Occupation: @GUAROCC @  Subjective:  Medication monitoring   History of Present Illness: Amber [name] is a 60 y.o. female with history of seropositive rheumatoid arthritis and Sjogren's syndrome.  Patient is currently on Orencia 125 mg subcutaneous injections every 10 days.  She has noticed about 85% improvement in her joint pain and inflammation since starting on Orencia.  She has been tolerating Orencia without any injection site reactions.  According to the patient she recently missed 1 dose while on vacation so she took ibuprofen during that time.  She is no longer taking oral Voltaren tablets for pain relief.  She continues to have some pain and stiffness in her right index finger but denies any joint swelling.  She has persistent pain in her neck and has an appointment scheduled at [facility] on 06/06/2020 for further evaluation.  She has been taking Flexeril 5 mg at bedtime as needed for pain relief. She continues to have chronic sicca symptoms.  She has tried Biotene products in the past which did not provide any relief.  She has been using his eyedrops on a daily basis. She denies any recent infections.     Activities of Daily Living:  Patient reports morning stiffness for 0 minutes.   Patient Denies nocturnal pain.  Difficulty dressing/grooming: Denies Difficulty climbing stairs: Denies Difficulty getting out of chair: Denies Difficulty using hands for taps, buttons, cutlery, and/or writing: Denies  Review of Systems  Constitutional: Positive for fatigue.  HENT: Positive for mouth dryness and nose dryness. Negative for mouth sores.   Eyes: Positive for dryness. Negative for pain, itching and visual disturbance.  Respiratory: Negative for cough, hemoptysis,  shortness of breath and difficulty breathing.   Cardiovascular: Negative for chest pain, palpitations and swelling in legs/feet.  Gastrointestinal: Negative for abdominal pain, blood in stool, constipation and diarrhea.  Endocrine: Negative for increased urination.  Genitourinary: Negative for painful urination.  Musculoskeletal: Positive for myalgias, muscle tenderness and myalgias. Negative for arthralgias, joint pain, joint swelling, muscle weakness and morning stiffness.  Skin: Negative for color change, rash and redness.  Allergic/Immunologic: Negative for susceptible to infections.  Neurological: Positive for headaches. Negative for dizziness, numbness, memory loss and weakness.  Hematological: Negative for swollen glands.  Psychiatric/Behavioral: Positive for sleep disturbance. Negative for confusion.    PMFS History:  Patient Active Problem List   Diagnosis Date Noted  . B12 deficiency 05/03/2020  . Hyperlipidemia 05/03/2020  . Asthma 05/03/2020  . Diabetic peripheral neuropathy ([place]) 01/05/2020  . Class 2 severe obesity with serious comorbidity and body mass index (BMI) of 38.0 to 38.9 in adult, unspecified obesity type ([place]) 01/05/2020  . Adrenal mass, right ([place]) 01/05/2020  . Other fatigue 09/04/2017  . Essential hypertension 09/04/2017  . Controlled type 2 diabetes mellitus without complication, without long-term current use of insulin ([place]) 05/31/2017  . Chronic diastolic CHF (congestive heart failure) ([place]) 04/24/2017  . CAD (coronary artery disease) 04/24/2017  . History of BCG vaccination 06/14/2016  . Fibroids 07/27/2014  . Migraine 04/19/2014  . Nail fungus 04/09/2014  . Elevated serum protein level 04/09/2014  . Preventative health care 04/09/2014  . Iron deficiency anemia  09/02/2013  . Headache(784.0) 03/04/2013  . Obesity (BMI 30-39.9) 10/17/2012  . Palpitations 10/01/2012  . Glaucoma 11/07/2011  . Rheumatoid arthritis ([place]) 11/07/2011  . Sjogren's disease  ([place]) 11/05/2011  . Anemia 09/22/2010  . Fibroid uterus 09/22/2010  . Neutropenia ([place]) 09/22/2010  . Dysmenorrhea 09/15/2010    Past Medical History:  Diagnosis Date  . Anemia   . Arthritis   . Asthma 05/03/2020  . Blood transfusion without reported diagnosis   . Colon polyps   . Controlled type 2 diabetes mellitus without complication, without long-term current use of insulin ([place]) 05/31/2017  . Ectopic pregnancy    RIGHT salpingostomy  . Elective abortion   . GERD (gastroesophageal reflux disease)   . Glaucoma    both eyes  . Headache    migraines  . History of chicken pox   . Hyperlipemia   . Hypertension   . Rheumatoid arthritis(714.0) 11/07/2011  . SAB (spontaneous abortion)   . Shortness of breath   . Sjogren's disease ([place])   . Wheezing     Family History  Problem Relation Age of Onset  . Hypertension Mother        died from heart disease  . Heart disease Mother        deceased, unknown cause  . Stroke Mother   . Obesity Mother   . Diabetes Sister   . Hypertension Sister   . Esophageal cancer Sister   . Stomach cancer Sister   . Cancer Sister   . Diabetes Sister   . Rectal cancer Neg Hx   . Colon cancer Neg Hx    Past Surgical History:  Procedure Laterality Date  . ABDOMINAL SURGERY     bowel repair  . COLONOSCOPY    . DILATION AND CURETTAGE OF UTERUS     X 2  . MENISCUS REPAIR Right    MENISCUS TEAR REPAIR  . MYOMECTOMY N/A 07/27/2014   Procedure: ABDOMINAL MYOMECTOMY;  Surgeon: Waymon Amato, MD;  Location: [facility];  Service: Gynecology;  Laterality: N/A;  . PELVIC LAPAROSCOPY  1994   IN 1999 LAPAROSCOPY WITH INCIDENTAL ENTEROTOMY REQUIRING LAPAROTOMY  . 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  Had a sister up in  [place] who passed away 05/05/15 who she was close to.   Born in [place]- she came to [place] as adult.  Age 81.    Left-handed.         Immunization History  Administered Date(s) Administered  . Hepatitis A, Adult 11/01/2017, 12/03/2018  . IPV 11/01/2017  . Influenza Split 11/07/2011  . Influenza,inj,Quad PF,6+ Mos 10/17/2012, 01/15/2014, 08/31/2014, 09/30/2017, 09/10/2018, 09/21/2019  . Meningococcal Mcv4o 11/01/2017  . Moderna Sars-Covid-2 Vaccination 01/28/2019, 02/27/2019, 11/27/2019  . Pneumococcal Polysaccharide-23 09/10/2018  . Tdap 06/01/2011, 11/25/2012  . Typhoid Live 11/01/2017  . Zoster Recombinat (Shingrix) 10/04/2017, 12/04/2017     Objective: Vital Signs: BP 107/69 (BP Location: Left Arm, Patient Position: Sitting, Cuff Size: Normal)   Pulse 90   Ht 5\' 1"  (1.549 m)   Wt 212 lb (96.2 kg)   LMP 08/09/2016   BMI 40.06 kg/m    Physical Exam Vitals and nursing note reviewed.  Constitutional:  Appearance: She is well-developed.  HENT:     Head: Normocephalic and atraumatic.  Eyes:     Conjunctiva/sclera: Conjunctivae normal.  Pulmonary:     Effort: Pulmonary effort is normal.  Abdominal:     Palpations: Abdomen is soft.  Musculoskeletal:     Cervical back: Normal range of motion.  Skin:    General: Skin is warm and dry.     Capillary Refill: Capillary refill takes less than 2 seconds.  Neurological:     Mental Status: She is alert and oriented to person, place, and time.  Psychiatric:        Behavior: Behavior normal.      Musculoskeletal Exam: C-spine painful and limited ROM.  Thoracic and lumbar spine good ROM.  Shoulder joints have good ROM with some stiffness bilaterally.  Tenderness to palpation of the left shoulder.  Elbow joints and wrist joints good ROM with no tenderness or inflammation.  Tenderness over the right 2nd MCP and PIP joint.  Complete fist formation bilaterally.  Knee joints good ROM with no warmth or effusion.  Ankle joints good ROM with  no tenderness or joint swelling.  CDAI Exam: CDAI Score: 0.8  Patient Global: 4 mm; Provider Global: 4 mm Swollen: 0 ; Tender: 0  Joint Exam 05/25/2020   No joint exam has been documented for this visit   There is currently no information documented on the homunculus. Go to the Rheumatology activity and complete the homunculus joint exam.  Investigation: No additional findings.  Imaging: No results found.  Recent Labs: Lab Results  Component Value Date   WBC 4.1 04/29/2020   HGB 12.6 04/29/2020   PLT 232 04/29/2020   NA 135 03/22/2020   K 3.5 03/22/2020   CL 97 (L) 03/22/2020   CO2 29 03/22/2020   GLUCOSE 138 (H) 03/22/2020   BUN 16 03/22/2020   CREATININE 0.80 03/22/2020   BILITOT 0.6 02/19/2020   ALKPHOS 62 09/10/2018   AST 21 02/19/2020   ALT 20 02/19/2020   PROT 8.8 (H) 02/19/2020   ALBUMIN 4.0 09/10/2018   CALCIUM 9.3 03/22/2020   GFRAA 115 02/19/2020   QFTBGOLD Negative 06/14/2016   QFTBGOLDPLUS NEGATIVE 01/20/2020    Speciality Comments: PLQ eye exam: 12/09/2018 normal. [facility].  Procedures:  No procedures performed Allergies: Patient has no known allergies.   Assessment / Plan:     Visit Diagnoses: Rheumatoid arthritis involving multiple sites with positive rheumatoid factor (HCC) - Positive RF, negative anti-CCP, 14 3 3  eta positive, history of rheumatoid arthritis for the last 10 years.  She was previously under the care of Dr. Graylon Gunning and [name]: She has noticed significant clinical improvement since starting on Orencia in January 2022.  She has tenderness palpation over the right second MCP and PIP joints but no synovitis was noted.  Her nocturnal pain and morning stiffness have subsided.  She has had less difficulty with ADLs. She has noticed about an 85% improvement in her joint pain and inflammation on the current treatment regimen.  She has been injecting Orencia every 10 days due to history of neutropenia.  She recently missed 1 dose of  Orencia while on vacation since the injectable device was left out longer than the recommended time.  A sample of Orencia was provided to the patient today.  She will continue on the current treatment regimen.  She was advised to notify us if she develops increased joint pain or joint swelling.  She will follow-up in the  office in 5 months.  High risk medication use - Orencia 125 mg subcutaneous every 10 days. Initially started on Orencia 01/28/20. (methotrexate was discontinued due to neutropenia). - Plan: CBC with Differential/Platelet, COMPLETE METABOLIC PANEL WITH GFR CBC updated on 04/29/2020.  BMP drawn on 03/22/2020. TB Gold negative on 01/20/2020 and will continue to be monitored yearly. She has not had any recent infections.  We discussed the importance of holding Orencia if she develops signs or symptoms of an infection and to resume once infection has completely cleared.  Sjogren's syndrome with other organ involvement ([place]) - Anti-Ro positive, anti-La positive: She continues to have chronic sicca symptoms.  Unchanged.  She uses xiidra eye drops on a daily basis and has tried biotene products for symptomatic relief.   Primary osteoarthritis of both knees: She has good ROM of both knee joints with no discomfort.  No warmth or effusion of knee joints.   Primary osteoarthritis of both feet: She is not experiencing any discomfort in her feet at this time.  No tenderness over MTP joints.   Plantar fasciitis of right foot - She was evaluated by Dr. Paulla Dolly on 03/07/2020 and was diagnosed with plantar fasciitis of the right foot.  Her symptoms have improved.  She is no longer taking oral diclofenac. She is wearing proper fitting shoes.   Neck pain: She continues to have chronic neck pain and stiffness.  She has limited range of motion on examination today.  Painful extension of the C-spine noted.  She has an upcoming appointment at [facility] for further evaluation and management.  She plans on  continuing to take Flexeril 5 mg 1 tablet by mouth at bedtime as needed for muscle spasms and pain relief.   Other medical conditions are listed as follows:   Family history of rheumatoid arthritis - Sister.   Essential hypertension  Coronary artery disease involving native coronary artery of native heart without angina pectoris  Controlled type 2 diabetes mellitus without complication, without long-term current use of insulin (HCC)  Hx of migraines  Other fatigue  Chronic diastolic CHF (congestive heart failure) ([place])  Palpitations  Orders: Orders Placed This Encounter  Procedures  . CBC with Differential/Platelet  . COMPLETE METABOLIC PANEL WITH GFR   No orders of the defined types were placed in this encounter.    Follow-Up Instructions: Return in about 5 months (around 10/25/2020) for Rheumatoid arthritis, Sjogren's syndrome.   Ofilia Neas, PA-C  Note - This record has been created using Dragon software.  Chart creation errors have been sought, but may not always  have been located. Such creation errors do not reflect on  the standard of medical care.

## 2020-05-25 ENCOUNTER — Ambulatory Visit: Payer: 59 | Admitting: Physician Assistant

## 2020-05-25 ENCOUNTER — Other Ambulatory Visit: Payer: Self-pay

## 2020-05-25 ENCOUNTER — Encounter: Payer: Self-pay | Admitting: Physician Assistant

## 2020-05-25 VITALS — BP 107/69 | HR 90 | Ht 61.0 in | Wt 212.0 lb

## 2020-05-25 DIAGNOSIS — M170 Bilateral primary osteoarthritis of knee: Secondary | ICD-10-CM

## 2020-05-25 DIAGNOSIS — Z79899 Other long term (current) drug therapy: Secondary | ICD-10-CM

## 2020-05-25 DIAGNOSIS — E119 Type 2 diabetes mellitus without complications: Secondary | ICD-10-CM

## 2020-05-25 DIAGNOSIS — M722 Plantar fascial fibromatosis: Secondary | ICD-10-CM

## 2020-05-25 DIAGNOSIS — M542 Cervicalgia: Secondary | ICD-10-CM

## 2020-05-25 DIAGNOSIS — I2510 Atherosclerotic heart disease of native coronary artery without angina pectoris: Secondary | ICD-10-CM

## 2020-05-25 DIAGNOSIS — M19071 Primary osteoarthritis, right ankle and foot: Secondary | ICD-10-CM

## 2020-05-25 DIAGNOSIS — I10 Essential (primary) hypertension: Secondary | ICD-10-CM

## 2020-05-25 DIAGNOSIS — R5383 Other fatigue: Secondary | ICD-10-CM

## 2020-05-25 DIAGNOSIS — R002 Palpitations: Secondary | ICD-10-CM

## 2020-05-25 DIAGNOSIS — M0579 Rheumatoid arthritis with rheumatoid factor of multiple sites without organ or systems involvement: Secondary | ICD-10-CM

## 2020-05-25 DIAGNOSIS — M19072 Primary osteoarthritis, left ankle and foot: Secondary | ICD-10-CM

## 2020-05-25 DIAGNOSIS — I5032 Chronic diastolic (congestive) heart failure: Secondary | ICD-10-CM

## 2020-05-25 DIAGNOSIS — Z8669 Personal history of other diseases of the nervous system and sense organs: Secondary | ICD-10-CM

## 2020-05-25 DIAGNOSIS — Z8261 Family history of arthritis: Secondary | ICD-10-CM

## 2020-05-25 DIAGNOSIS — M3509 Sicca syndrome with other organ involvement: Secondary | ICD-10-CM | POA: Diagnosis not present

## 2020-05-25 NOTE — Patient Instructions (Signed)
Standing Labs We placed an order today for your standing lab work.   Please have your standing labs drawn in June   If possible, please have your labs drawn 2 weeks prior to your appointment so that the provider can discuss your results at your appointment.  Please note that you may see your imaging and lab results in [facility] before we have reviewed them. We may be awaiting multiple results to interpret others before contacting you. Please allow our office up to 72 hours to thoroughly review all of the results before contacting the office for clarification of your results.  We have open lab daily: Monday through Thursday from 1:30-4:30 PM and Friday from 1:30-4:00 PM at the office of Dr. Bo Merino, [facility].   Please be advised, all patients with office appointments requiring lab work will take precedent over walk-in lab work.  If possible, please come for your lab work on Monday and Friday afternoons, as you may experience shorter wait times. The office is located at [address], [place], [place], [place] [address] No appointment is necessary.   Labs are drawn by [facility]. Please bring your co-pay at the time of your lab draw.  You may receive a bill from [facility] for your lab work.  If you wish to have your labs drawn at another location, please call the office 24 hours in advance to send orders.  If you have any questions regarding directions or hours of operation,  please call [phone].   As a reminder, please drink plenty of water prior to coming for your lab work. Thanks!

## 2020-05-25 NOTE — Progress Notes (Signed)
Medication Samples have been provided to the patient.  Patient advised to call Orencia Oncall to have travel cooler shipped to her home at no cost  Drug name: Orencia 125mg /ml Clickject Qty: 1 pen LOT: [id number] Exp.Date: 07/2021  Knox Saliva, PharmD, MPH Clinical Pharmacist (Rheumatology and Pulmonology)

## 2020-07-06 ENCOUNTER — Other Ambulatory Visit: Payer: Self-pay | Admitting: Family

## 2020-07-12 ENCOUNTER — Other Ambulatory Visit: Payer: Self-pay

## 2020-07-12 DIAGNOSIS — Z79899 Other long term (current) drug therapy: Secondary | ICD-10-CM

## 2020-07-13 ENCOUNTER — Telehealth: Payer: Self-pay | Admitting: *Deleted

## 2020-07-13 DIAGNOSIS — Z79899 Other long term (current) drug therapy: Secondary | ICD-10-CM

## 2020-07-13 LAB — COMPLETE METABOLIC PANEL WITH GFR
AG Ratio: 1 (calc) (ref 1.0–2.5)
ALT: 23 U/L (ref 6–29)
AST: 24 U/L (ref 10–35)
Albumin: 3.9 g/dL (ref 3.6–5.1)
Alkaline phosphatase (APISO): 51 U/L (ref 37–153)
BUN: 8 mg/dL (ref 7–25)
CO2: 26 mmol/L (ref 20–32)
Calcium: 9.7 mg/dL (ref 8.6–10.4)
Chloride: 106 mmol/L (ref 98–110)
Creat: 0.59 mg/dL (ref 0.50–1.03)
Globulin: 3.9 g/dL (calc) — ABNORMAL HIGH (ref 1.9–3.7)
Glucose, Bld: 135 mg/dL — ABNORMAL HIGH (ref 65–99)
Potassium: 4 mmol/L (ref 3.5–5.3)
Sodium: 139 mmol/L (ref 135–146)
Total Bilirubin: 0.8 mg/dL (ref 0.2–1.2)
Total Protein: 7.8 g/dL (ref 6.1–8.1)
eGFR: 104 mL/min/{1.73_m2} (ref >=60)

## 2020-07-13 LAB — CBC WITH DIFFERENTIAL/PLATELET
Absolute Monocytes: 346 cells/uL (ref 200–950)
Basophils Absolute: 11 cells/uL (ref 0–200)
Basophils Relative: 0.3 %
Eosinophils Absolute: 61 cells/uL (ref 15–500)
Eosinophils Relative: 1.7 %
HCT: 40.3 % (ref 35.0–45.0)
Hemoglobin: 12.8 g/dL (ref 11.7–15.5)
Lymphs Abs: 2063 cells/uL (ref 850–3900)
MCH: 27.6 pg (ref 27.0–33.0)
MCHC: 31.8 g/dL — ABNORMAL LOW (ref 32.0–36.0)
MCV: 87 fL (ref 80.0–100.0)
MPV: 10.5 fL (ref 7.5–12.5)
Monocytes Relative: 9.6 %
Neutro Abs: 1120 cells/uL — ABNORMAL LOW (ref 1500–7800)
Neutrophils Relative %: 31.1 %
Platelets: 187 10*3/uL (ref 140–400)
RBC: 4.63 10*6/uL (ref 3.80–5.10)
RDW: 12.6 % (ref 11.0–15.0)
Total Lymphocyte: 57.3 %
WBC: 3.6 10*3/uL — ABNORMAL LOW (ref 3.8–10.8)

## 2020-07-13 NOTE — Progress Notes (Signed)
CBC and CMP are stable.  WBC count is borderline low-3.6.  ok to continue on current treatment with close lab monitoring.  Recommend rechecking CBC with diff every month.

## 2020-07-13 NOTE — Telephone Encounter (Signed)
-----   Message from Ofilia Neas, PA-C sent at 07/13/2020  3:43 PM EDT ----- CBC and CMP are stable.  WBC count is borderline low-3.6.  ok to continue on current treatment with close lab monitoring.  Recommend rechecking CBC with diff every month.

## 2020-08-05 ENCOUNTER — Ambulatory Visit: Payer: 59 | Admitting: Family

## 2020-08-05 ENCOUNTER — Other Ambulatory Visit: Payer: Self-pay

## 2020-08-05 VITALS — BP 139/72 | HR 57 | Temp 98.6°F | Resp 16 | Ht 61.0 in | Wt 215.0 lb

## 2020-08-05 DIAGNOSIS — I10 Essential (primary) hypertension: Secondary | ICD-10-CM | POA: Diagnosis not present

## 2020-08-05 DIAGNOSIS — Z23 Encounter for immunization: Secondary | ICD-10-CM | POA: Diagnosis not present

## 2020-08-05 DIAGNOSIS — D509 Iron deficiency anemia, unspecified: Secondary | ICD-10-CM

## 2020-08-05 DIAGNOSIS — G43009 Migraine without aura, not intractable, without status migrainosus: Secondary | ICD-10-CM

## 2020-08-05 DIAGNOSIS — M0579 Rheumatoid arthritis with rheumatoid factor of multiple sites without organ or systems involvement: Secondary | ICD-10-CM

## 2020-08-05 DIAGNOSIS — I5032 Chronic diastolic (congestive) heart failure: Secondary | ICD-10-CM

## 2020-08-05 DIAGNOSIS — D508 Other iron deficiency anemias: Secondary | ICD-10-CM

## 2020-08-05 DIAGNOSIS — E538 Deficiency of other specified B group vitamins: Secondary | ICD-10-CM

## 2020-08-05 DIAGNOSIS — J45909 Unspecified asthma, uncomplicated: Secondary | ICD-10-CM

## 2020-08-05 DIAGNOSIS — E119 Type 2 diabetes mellitus without complications: Secondary | ICD-10-CM | POA: Diagnosis not present

## 2020-08-05 LAB — CBC WITH DIFFERENTIAL/PLATELET
Basophils Absolute: 0 10*3/uL (ref 0.0–0.1)
Basophils Relative: 0.4 % (ref 0.0–3.0)
Eosinophils Absolute: 0.1 10*3/uL (ref 0.0–0.7)
Eosinophils Relative: 2.6 % (ref 0.0–5.0)
HCT: 40 % (ref 36.0–46.0)
Hemoglobin: 13 g/dL (ref 12.0–15.0)
Lymphocytes Relative: 62.3 % — ABNORMAL HIGH (ref 12.0–46.0)
Lymphs Abs: 1.7 10*3/uL (ref 0.7–4.0)
MCHC: 32.5 g/dL (ref 30.0–36.0)
MCV: 86 fl (ref 78.0–100.0)
Monocytes Absolute: 0.2 10*3/uL (ref 0.1–1.0)
Monocytes Relative: 8 % (ref 3.0–12.0)
Neutro Abs: 0.7 10*3/uL — ABNORMAL LOW (ref 1.4–7.7)
Neutrophils Relative %: 26.7 % — ABNORMAL LOW (ref 43.0–77.0)
Platelets: 224 10*3/uL (ref 150.0–400.0)
RBC: 4.65 Mil/uL (ref 3.87–5.11)
RDW: 13.9 % (ref 11.5–15.5)
WBC: 2.8 10*3/uL — ABNORMAL LOW (ref 4.0–10.5)

## 2020-08-05 LAB — IRON: Iron: 72 ug/dL (ref 42–145)

## 2020-08-05 LAB — HEMOGLOBIN A1C: Hgb A1c MFr Bld: 6.3 % (ref 4.6–6.5)

## 2020-08-05 MED ORDER — AMLODIPINE BESYLATE 5 MG PO TABS: 5.0000 mg | ORAL_TABLET | Freq: Every day | ORAL | 1 refills | Status: DC

## 2020-08-05 MED ORDER — LISINOPRIL 40 MG PO TABS: 40.0000 mg | ORAL_TABLET | Freq: Every day | ORAL | 1 refills | Status: DC

## 2020-08-05 MED ORDER — METOPROLOL SUCCINATE ER 100 MG PO TB24: 100.0000 mg | ORAL_TABLET | Freq: Every day | ORAL | 1 refills | Status: DC

## 2020-08-05 MED ORDER — SUMATRIPTAN SUCCINATE 50 MG PO TABS: ORAL_TABLET | ORAL | 5 refills | Status: DC

## 2020-08-05 MED ORDER — HYDROCHLOROTHIAZIDE 25 MG PO TABS: 25.0000 mg | ORAL_TABLET | Freq: Every day | ORAL | 1 refills | Status: DC

## 2020-08-05 NOTE — Progress Notes (Signed)
Subjective:   By signing my name below, I, Shehryar Baig, attest that this documentation has been prepared under the direction and in the presence of [name] NP. 08/05/2020    Patient ID: Amber [name], female    DOB: February 04, 1960, 60 y.o.   MRN: [id number]  Chief Complaint  Patient presents with   Hypertension    Here for follow up    HPI Patient is in today for a office visit.  She is requesting to switch her prescription services from [facility] to [facility] in [place], [place].  Rheumatoid arthritis- She reports her symptoms have improved since taking 75 mg Voltaren daily PO.  Cholesterol- She continues controlling her cholesterol levels through her diet. Blood pressure- Her blood pressure is doing well during this visit. She continues taking 5 mg amlodipine daily PO, 100 mg metoprolol succinate daily PO, 25 mg hydrochlorothiazide daily PO, 40 mg Zestril daily PO and reports no new issues while taking them. She not longer takes 20 mg lasix daily PO since she started taking hydrochlorothiazide daily PO.  BP Readings from Last 3 Encounters:  08/05/20 139/72  05/25/20 107/69  05/03/20 138/88   Migraines- She continues having migraines and does not take medication to manage it at this time. She is requesting a refill for 50 mg Imitrex daily PO.  Muscle relaxer- She no longer takes 500 mg robaxin PRN.  Immunizations- She is due for the pneumonia vaccines and is interested in getting it during this visit. She has 3 moderna Covid-19 vaccines at this time and is interested in getting the 2nd booster vaccine at a later time.  Asthma- She reports that her asthma symptoms are not bothering her at this time.  Iron- She no longer take iron supplements. Diet- She is trying maintain a healthy diet.    Health Maintenance Due  Topic Date Due   Pneumococcal Vaccine 74-21 Years old (2 - PCV) 09/10/2019   COVID-19 Vaccine (4 - Booster for Moderna series) 02/27/2020   INFLUENZA  VACCINE  08/01/2020   PAP SMEAR-Modifier  09/30/2020    Past Medical History:  Diagnosis Date   Anemia    Arthritis    Asthma 05/03/2020   Blood transfusion without reported diagnosis    Colon polyps    Controlled type 2 diabetes mellitus without complication, without long-term current use of insulin ([place]) 05/31/2017   Ectopic pregnancy    RIGHT salpingostomy   Elective abortion    GERD (gastroesophageal reflux disease)    Glaucoma    both eyes   Headache    migraines   History of chicken pox    Hyperlipemia    Hypertension    Rheumatoid arthritis(714.0) 11/07/2011   SAB (spontaneous abortion)    Shortness of breath    Sjogren's disease ([facility])    Wheezing     Past Surgical History:  Procedure Laterality Date   ABDOMINAL SURGERY     bowel repair   COLONOSCOPY     DILATION AND CURETTAGE OF UTERUS     X 2   MENISCUS REPAIR Right    MENISCUS TEAR REPAIR   MYOMECTOMY N/A 07/27/2014   Procedure: ABDOMINAL MYOMECTOMY;  Surgeon: Waymon Amato, MD;  Location: [facility];  Service: Gynecology;  Laterality: N/A;   PELVIC LAPAROSCOPY  1994   IN 1999 LAPAROSCOPY WITH INCIDENTAL ENTEROTOMY REQUIRING LAPAROTOMY   UTERINE FIBROID SURGERY     July 2016    Family History  Problem Relation Age of Onset   Hypertension Mother  died from heart disease   Heart disease Mother        deceased, unknown cause   Stroke Mother    Obesity Mother    Diabetes Sister    Hypertension Sister    Esophageal cancer Sister    Stomach cancer Sister    Cancer Sister    Diabetes Sister    Rectal cancer Neg Hx    Colon cancer Neg Hx     Social History   Socioeconomic History   Marital status: Single    Spouse name: Not on file   Number of children: 0   Years of education: college   Highest education level: Not on file  Occupational History   Occupation: LPN    Employer: [employer]  Tobacco Use   Smoking status: Never   Smokeless tobacco: Never  Vaping Use   Vaping Use: Never used   Substance and Sexual Activity   Alcohol use: No    Alcohol/week: 0.0 standard drinks   Drug use: No   Sexual activity: Yes    Birth control/protection: None    Comment: 1st intercourse- 89, partners- 60  Other Topics Concern   Not on file  Social History Narrative   Regular exercise:  No   Caffeine Use: 2 cups coffee daily   No children   "Happily divorced"   Reports that she is involved at her church   Works as an Therapist, 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  Had a sister up in [place] who passed away 04/01/15 who she was close to.   Born [place]- she came to [place] as adult.  Age 25.    Left-handed.         Social Determinants of Health   Financial Resource Strain: Not on file  Food Insecurity: Not on file  Transportation Needs: Not on file  Physical Activity: Not on file  Stress: Not on file  Social Connections: Not on file  Intimate Partner Violence: Not on file    Outpatient Medications Prior to Visit  Medication Sig Dispense Refill   Abatacept (ORENCIA CLICKJECT) 585 MG/ML SOAJ Inject $RemoveBef'125mg'[id number]$  subcutaneously every 10 days 4 mL 1   albuterol (VENTOLIN HFA) 108 (90 Base) MCG/ACT inhaler Inhale 2 puffs into the lungs every 6 (six) hours as needed for wheezing or shortness of breath. 18 g 5   aspirin EC 81 MG tablet Take 1 tablet (81 mg total) by mouth daily. 90 tablet 3   blood glucose meter kit and supplies KIT Dispense based on patient and insurance preference. Use up to four times daily as directed. (FOR ICD-9 250.00, 250.01). 1 each 0   Blood Pressure Monitoring (ADULT BLOOD PRESSURE CUFF LG) KIT Use as directed 1 kit 0   calcium-vitamin D (OSCAL WITH D) 500-200 MG-UNIT TABS tablet Take by mouth.     Cyanocobalamin (VITAMIN B-12) 5000 MCG LOZG Take 1 tablet by mouth 2 (two) times daily.     cyclobenzaprine (FLEXERIL) 5 MG tablet Take 5 mg by mouth at bedtime.     diclofenac (VOLTAREN) 75 MG EC tablet Take 1 tablet (75 mg total) by mouth 2 (two) times daily. 50 tablet 2   Lifitegrast  (XIIDRA) 5 % SOLN Place 1 drop into both eyes 2 times daily.     TRAVATAN Z 0.004 % SOLN ophthalmic solution Place 1 drop into both eyes at bedtime.      amLODipine (NORVASC) 5 MG tablet Take 1 tablet (5 mg total) by mouth daily. 90 tablet 1  ferrous sulfate 325 (65 FE) MG tablet Take 325 mg by mouth 3 (three) times daily with meals.     furosemide (LASIX) 20 MG tablet TAKE 1 TABLET BY MOUTH ONCE DAILY AS NEEDED FOR FLUID 90 tablet 0   hydrochlorothiazide (HYDRODIURIL) 25 MG tablet Take 1 tablet (25 mg total) by mouth daily. 90 tablet 1   lisinopril (ZESTRIL) 40 MG tablet Take 1 tablet (40 mg total) by mouth daily. 90 tablet 1   methocarbamol (ROBAXIN) 500 MG tablet Take 1 tablet (500 mg total) by mouth every 8 (eight) hours as needed for muscle spasms. 30 tablet 1   metoprolol succinate (TOPROL-XL) 100 MG 24 hr tablet Take 1 tablet (100 mg total) by mouth daily. Take with or immediately following a meal. 90 tablet 1   SUMAtriptan (IMITREX) 50 MG tablet TAKE 1 TABLET BY MOUTH EVERY 2 HOURS AS NEEDED FOR  MIGRAINE  MAY  REPEAT  IN  2  HOURS  IF  HEADACHE  PERSISTS  OR  RECURS 9 tablet 5   No facility-administered medications prior to visit.    No Known Allergies  ROS     Objective:    Physical Exam Constitutional:      General: She is not in acute distress.    Appearance: Normal appearance. She is not ill-appearing.  HENT:     Head: Normocephalic and atraumatic.     Right Ear: External ear normal.     Left Ear: External ear normal.  Eyes:     Extraocular Movements: Extraocular movements intact.     Pupils: Pupils are equal, round, and reactive to light.  Cardiovascular:     Rate and Rhythm: Normal rate and regular rhythm.     Heart sounds: Normal heart sounds. No murmur heard.   No gallop.  Pulmonary:     Effort: Pulmonary effort is normal. No respiratory distress.     Breath sounds: Normal breath sounds. No wheezing or rales.  Skin:    General: Skin is warm and dry.   Neurological:     Mental Status: She is alert and oriented to person, place, and time.  Psychiatric:        Behavior: Behavior normal.    BP 139/72 (BP Location: Right Arm, Patient Position: Sitting, Cuff Size: Large)   Pulse (!) 57   Temp 98.6 F (37 C) (Oral)   Resp 16   Ht _0  (1.549 m)   Wt 215 lb (97.5 kg)   LMP 08/09/2016   SpO2 100%   BMI 40.62 kg/m  Wt Readings from Last 3 Encounters:  08/05/20 215 lb (97.5 kg)  05/25/20 212 lb (96.2 kg)  05/03/20 210 lb (95.3 kg)       Assessment & Plan:   Problem List Items Addressed This Visit       Unprioritized   Rheumatoid arthritis ([place])    Notes improved symptoms since starting orencia.  Management per rheumatology.        Migraine    Stable, continue imitrex prn.        Relevant Medications   metoprolol succinate (TOPROL-XL) 100 MG 24 hr tablet   amLODipine (NORVASC) 5 MG tablet   hydrochlorothiazide (HYDRODIURIL) 25 MG tablet   lisinopril (ZESTRIL) 40 MG tablet   SUMAtriptan (IMITREX) 50 MG tablet   Iron deficiency anemia - Primary   Relevant Orders   CBC with Differential/Platelet   Iron   Essential hypertension    BP stable. Continue amlodipine 65m, hctz 241m lisinopril  28m, toprol xl 1045m        Relevant Medications   metoprolol succinate (TOPROL-XL) 100 MG 24 hr tablet   amLODipine (NORVASC) 5 MG tablet   hydrochlorothiazide (HYDRODIURIL) 25 MG tablet   lisinopril (ZESTRIL) 40 MG tablet   Controlled type 2 diabetes mellitus without complication, without long-term current use of insulin ([facility])    Obtain A1C.  Clinically stable. Monitor.        Relevant Medications   lisinopril (ZESTRIL) 40 MG tablet   Other Relevant Orders   Hemoglobin A1c   Chronic diastolic CHF (congestive heart failure) ([facility])    Appears euvolemic. No longer needed lasix now that she is on daily hctz.         Relevant Medications   metoprolol succinate (TOPROL-XL) 100 MG 24 hr tablet   amLODipine (NORVASC) 5 MG  tablet   hydrochlorothiazide (HYDRODIURIL) 25 MG tablet   lisinopril (ZESTRIL) 40 MG tablet   B12 deficiency    Continue oral b12 supplement 5000 mcg daily.        Asthma    Stable. Continue albuterol.         Anemia    Not taking iron supplement. Check CBC, Serum iron, Ferritin       Relevant Orders   CBC with Differential/Platelet   Iron   Prevnar 13 today.  Meds ordered this encounter  Medications   metoprolol succinate (TOPROL-XL) 100 MG 24 hr tablet    Sig: Take 1 tablet (100 mg total) by mouth daily. Take with or immediately following a meal.    Dispense:  90 tablet    Refill:  1    Order Specific Question:   Supervising Provider    Answer:   BLPenni Homans [[id number]]   amLODipine (NORVASC) 5 MG tablet    Sig: Take 1 tablet (5 mg total) by mouth daily.    Dispense:  90 tablet    Refill:  1    Order Specific Question:   Supervising Provider    Answer:   BLPenni Homans [[id number]]   hydrochlorothiazide (HYDRODIURIL) 25 MG tablet    Sig: Take 1 tablet (25 mg total) by mouth daily.    Dispense:  90 tablet    Refill:  1    Order Specific Question:   Supervising Provider    Answer:   BLPenni Homans [[id number]]   lisinopril (ZESTRIL) 40 MG tablet    Sig: Take 1 tablet (40 mg total) by mouth daily.    Dispense:  90 tablet    Refill:  1    Order Specific Question:   Supervising Provider    Answer:   BLPenni Homans [[id number]]   SUMAtriptan (IMITREX) 50 MG tablet    Sig: TAKE 1 TABLET BY MOUTH EVERY 2 HOURS AS NEEDED FOR  MIGRAINE  MAY  REPEAT  IN  2  HOURS  IF  HEADACHE  PERSISTS  OR  RECURS    Dispense:  9 tablet    Refill:  5    Order Specific Question:   Supervising Provider    Answer:   BLPenni Homans [[id number]]    I, MeDebbrah AlarP, personally preformed the services described in this documentation.  All medical record entries made by the scribe were at my direction and in my presence.  I have reviewed the chart and discharge instructions (if applicable) and agree that  the record reflects my personal performance and is accurate and complete. 08/05/2020   I,Shehryar Baig,acting  as a Education administrator for [name], NP.,have documented all relevant documentation on the behalf of [name], NP,as directed by  [name], NP while in the presence of [name], NP.   [name], NP

## 2020-08-05 NOTE — Assessment & Plan Note (Signed)
Appears euvolemic. No longer needed lasix now that she is on daily hctz.

## 2020-08-05 NOTE — Assessment & Plan Note (Signed)
Continue oral b12 supplement 5000 mcg daily.

## 2020-08-05 NOTE — Assessment & Plan Note (Signed)
Notes improved symptoms since starting orencia.  Management per rheumatology.

## 2020-08-05 NOTE — Patient Instructions (Signed)
Please complete lab work prior to leaving.   

## 2020-08-05 NOTE — Assessment & Plan Note (Signed)
Obtain A1C.  Clinically stable. Monitor.

## 2020-08-05 NOTE — Assessment & Plan Note (Signed)
Stable.  Continue albuterol. 

## 2020-08-05 NOTE — Assessment & Plan Note (Signed)
Not taking iron supplement. Check CBC, Serum iron, Ferritin

## 2020-08-05 NOTE — Assessment & Plan Note (Signed)
BP stable. Continue amlodipine '5mg'$ , hctz '25mg'$ , lisinopril '40mg'$ , toprol xl '100mg'$ .

## 2020-08-05 NOTE — Assessment & Plan Note (Signed)
Stable, continue imitrex prn.

## 2020-08-29 ENCOUNTER — Telehealth: Payer: Self-pay | Admitting: Family

## 2020-08-29 ENCOUNTER — Ambulatory Visit: Payer: 59 | Admitting: Family

## 2020-08-29 ENCOUNTER — Other Ambulatory Visit: Payer: Self-pay

## 2020-08-29 VITALS — BP 156/86 | HR 64 | Temp 98.8°F | Resp 16 | Ht 61.0 in | Wt 219.0 lb

## 2020-08-29 DIAGNOSIS — R0600 Dyspnea, unspecified: Secondary | ICD-10-CM

## 2020-08-29 DIAGNOSIS — J45909 Unspecified asthma, uncomplicated: Secondary | ICD-10-CM | POA: Diagnosis not present

## 2020-08-29 DIAGNOSIS — I5032 Chronic diastolic (congestive) heart failure: Secondary | ICD-10-CM

## 2020-08-29 DIAGNOSIS — R002 Palpitations: Secondary | ICD-10-CM | POA: Diagnosis not present

## 2020-08-29 DIAGNOSIS — I10 Essential (primary) hypertension: Secondary | ICD-10-CM

## 2020-08-29 DIAGNOSIS — R0609 Other forms of dyspnea: Secondary | ICD-10-CM

## 2020-08-29 LAB — CBC WITH DIFFERENTIAL/PLATELET
Basophils Absolute: 0 10*3/uL (ref 0.0–0.1)
Basophils Relative: 0.5 % (ref 0.0–3.0)
Eosinophils Absolute: 0.1 10*3/uL (ref 0.0–0.7)
Eosinophils Relative: 2.1 % (ref 0.0–5.0)
HCT: 38.1 % (ref 36.0–46.0)
Hemoglobin: 12.3 g/dL (ref 12.0–15.0)
Lymphocytes Relative: 66.5 % — ABNORMAL HIGH (ref 12.0–46.0)
Lymphs Abs: 2.1 10*3/uL (ref 0.7–4.0)
MCHC: 32.4 g/dL (ref 30.0–36.0)
MCV: 85.4 fl (ref 78.0–100.0)
Monocytes Absolute: 0.2 10*3/uL (ref 0.1–1.0)
Monocytes Relative: 7.7 % (ref 3.0–12.0)
Neutro Abs: 0.7 10*3/uL — ABNORMAL LOW (ref 1.4–7.7)
Neutrophils Relative %: 23.2 % — ABNORMAL LOW (ref 43.0–77.0)
Platelets: 220 10*3/uL (ref 150.0–400.0)
RBC: 4.46 Mil/uL (ref 3.87–5.11)
RDW: 13.6 % (ref 11.5–15.5)
WBC: 3.1 10*3/uL — ABNORMAL LOW (ref 4.0–10.5)

## 2020-08-29 LAB — BASIC METABOLIC PANEL
BUN: 12 mg/dL (ref 6–23)
CO2: 27 mEq/L (ref 19–32)
Calcium: 9.7 mg/dL (ref 8.4–10.5)
Chloride: 105 mEq/L (ref 96–112)
Creatinine, Ser: 0.6 mg/dL (ref 0.40–1.20)
GFR: 98.42 mL/min (ref >60.00)
Glucose, Bld: 78 mg/dL (ref 70–99)
Potassium: 3.9 mEq/L (ref 3.5–5.1)
Sodium: 140 mEq/L (ref 135–145)

## 2020-08-29 LAB — TSH: TSH: 1.05 u[IU]/mL (ref 0.35–5.50)

## 2020-08-29 LAB — BRAIN NATRIURETIC PEPTIDE: Pro B Natriuretic peptide (BNP): 128 pg/mL — ABNORMAL HIGH (ref 0.0–100.0)

## 2020-08-29 LAB — D-DIMER, QUANTITATIVE: D-Dimer, Quant: 0.49 mcg/mL FEU (ref <0.50)

## 2020-08-29 MED ORDER — FLUTICASONE PROPIONATE HFA 110 MCG/ACT IN AERO: 1.0000 | INHALATION_SPRAY | Freq: Two times a day (BID) | RESPIRATORY_TRACT | 3 refills | Status: DC

## 2020-08-29 MED ORDER — FUROSEMIDE 20 MG PO TABS: ORAL_TABLET | ORAL | 0 refills | Status: DC

## 2020-08-29 NOTE — Assessment & Plan Note (Addendum)
Wt Readings from Last 3 Encounters:  08/29/20 219 lb (99.3 kg)  08/05/20 215 lb (97.5 kg)  05/25/20 212 lb (96.2 kg)  EKG tracing is personally reviewed.  EKG notes NSR.  No acute changes.   I do want to check a D dimer to rule out PE.  Will also refer to cardiology for further evaluation.

## 2020-08-29 NOTE — Assessment & Plan Note (Signed)
BP was elevated today, however pt stated she had not yet taken her blood pressure medication.

## 2020-08-29 NOTE — Assessment & Plan Note (Signed)
Notes increased wheezing, though no wheezing noted on exam.  Will add flovent 176mg BID along with albuterol PRN.

## 2020-08-29 NOTE — Telephone Encounter (Signed)
Patient states that the medicine prescribed today for her (fluticasone (FLOVENT HFA) 110 MCG/ACT inhaler ) is too expensive and she wants to know if something cheaper can be sent in. Or if there is  sample she could have, please advice.

## 2020-08-29 NOTE — Patient Instructions (Addendum)
Please complete lab work prior to leaving. Please to go to the ER if you develop chest pain, worsening shortness of breath.  Start Flovent for asthma.  Continue albuterol as needed for wheezing.

## 2020-08-29 NOTE — Progress Notes (Signed)
Subjective:   By signing my name below, I, Shehryar Baig, attest that this documentation has been prepared under the direction and in the presence of [name] NP. 08/29/2020    Patient ID: Amber [name], female    DOB: 1960-04-27, 60 y.o.   MRN: [id number]  Chief Complaint  Patient presents with   Shortness of Breath    Patient complains of shortness of breath with activity    HPI Patient is in today for a office visit. Shortness of breath- She complains of shortness of breath for the past 2 weeks but found her symptoms worsened this past week. She also notices some wheezing when getting SOB as well. She reports being out of breath while walking up or down the stairs when she normally would not. She has heart palpitations. She denies having any calf pain, leg swelling, chest pain, long distance walking at this time.    Health Maintenance Due  Topic Date Due   COVID-19 Vaccine (4 - Booster for Moderna series) 02/27/2020   INFLUENZA VACCINE  08/01/2020   PAP SMEAR-Modifier  09/30/2020    Past Medical History:  Diagnosis Date   Anemia    Arthritis    Asthma 05/03/2020   Blood transfusion without reported diagnosis    Colon polyps    Controlled type 2 diabetes mellitus without complication, without long-term current use of insulin ([place]) 05/31/2017   Ectopic pregnancy    RIGHT salpingostomy   Elective abortion    GERD (gastroesophageal reflux disease)    Glaucoma    both eyes   Headache    migraines   History of chicken pox    Hyperlipemia    Hypertension    Rheumatoid arthritis(714.0) 11/07/2011   SAB (spontaneous abortion)    Shortness of breath    Sjogren's disease ([place])    Wheezing     Past Surgical History:  Procedure Laterality Date   ABDOMINAL SURGERY     bowel repair   COLONOSCOPY     DILATION AND CURETTAGE OF UTERUS     X 2   MENISCUS REPAIR Right    MENISCUS TEAR REPAIR   MYOMECTOMY N/A 07/27/2014   Procedure: ABDOMINAL MYOMECTOMY;   Surgeon: Waymon Amato, MD;  Location: [facility];  Service: Gynecology;  Laterality: N/A;   PELVIC LAPAROSCOPY  1994   IN 1999 LAPAROSCOPY WITH INCIDENTAL ENTEROTOMY REQUIRING LAPAROTOMY   UTERINE FIBROID SURGERY     July 2016    Family History  Problem Relation Age of Onset   Hypertension Mother        died from heart disease   Heart disease Mother        deceased, unknown cause   Stroke Mother    Obesity Mother    Diabetes Sister    Hypertension Sister    Esophageal cancer Sister    Stomach cancer Sister    Cancer Sister    Diabetes Sister    Rectal cancer Neg Hx    Colon cancer Neg Hx     Social History   Socioeconomic History   Marital status: Single    Spouse name: Not on file   Number of children: 0   Years of education: college   Highest education level: Not on file  Occupational History   Occupation: LPN    Employer: [employer]  Tobacco Use   Smoking status: Never   Smokeless tobacco: Never  Vaping Use   Vaping Use: Never used  Substance and Sexual Activity  Alcohol use: No    Alcohol/week: 0.0 standard drinks   Drug use: No   Sexual activity: Yes    Birth control/protection: None    Comment: 1st intercourse- 51, partners- 5  Other Topics Concern   Not on file  Social History Narrative   Regular exercise:  No   Caffeine Use: 2 cups coffee daily   No children   "Happily divorced"   Reports that she is involved at her church   Works as an Therapist, 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  Had a sister up in [place] who passed away 2015-03-04 who she was close to.   Born in [place]- she came to [place] as adult.  Age 89.    Left-handed.         Social Determinants of Health   Financial Resource Strain: Not on file  Food Insecurity: Not on file  Transportation Needs: Not on file  Physical Activity: Not on file  Stress: Not on file  Social Connections: Not on file  Intimate Partner Violence: Not on file    Outpatient Medications Prior to Visit  Medication Sig Dispense  Refill   Abatacept (ORENCIA CLICKJECT) 212 MG/ML SOAJ Inject 185m subcutaneously every 10 days 4 mL 1   albuterol (VENTOLIN HFA) 108 (90 Base) MCG/ACT inhaler Inhale 2 puffs into the lungs every 6 (six) hours as needed for wheezing or shortness of breath. 18 g 5   amLODipine (NORVASC) 5 MG tablet Take 1 tablet (5 mg total) by mouth daily. 90 tablet 1   aspirin EC 81 MG tablet Take 1 tablet (81 mg total) by mouth daily. 90 tablet 3   blood glucose meter kit and supplies KIT Dispense based on patient and insurance preference. Use up to four times daily as directed. (FOR ICD-9 250.00, 250.01). 1 each 0   Blood Pressure Monitoring (ADULT BLOOD PRESSURE CUFF LG) KIT Use as directed 1 kit 0   calcium-vitamin D (OSCAL WITH D) 500-200 MG-UNIT TABS tablet Take by mouth.     Cyanocobalamin (VITAMIN B-12) 5000 MCG LOZG Take 1 tablet by mouth 2 (two) times daily.     hydrochlorothiazide (HYDRODIURIL) 25 MG tablet Take 1 tablet (25 mg total) by mouth daily. 90 tablet 1   Lifitegrast (XIIDRA) 5 % SOLN Place 1 drop into both eyes 2 times daily.     lisinopril (ZESTRIL) 40 MG tablet Take 1 tablet (40 mg total) by mouth daily. 90 tablet 1   metoprolol succinate (TOPROL-XL) 100 MG 24 hr tablet Take 1 tablet (100 mg total) by mouth daily. Take with or immediately following a meal. 90 tablet 1   SUMAtriptan (IMITREX) 50 MG tablet TAKE 1 TABLET BY MOUTH EVERY 2 HOURS AS NEEDED FOR  MIGRAINE  MAY  REPEAT  IN  2  HOURS  IF  HEADACHE  PERSISTS  OR  RECURS 9 tablet 5   TRAVATAN Z 0.004 % SOLN ophthalmic solution Place 1 drop into both eyes at bedtime.      cyclobenzaprine (FLEXERIL) 5 MG tablet Take 5 mg by mouth at bedtime.     diclofenac (VOLTAREN) 75 MG EC tablet Take 1 tablet (75 mg total) by mouth 2 (two) times daily. 50 tablet 2   No facility-administered medications prior to visit.    No Known Allergies  Review of Systems  Respiratory:  Positive for shortness of breath and wheezing.   Cardiovascular:   Positive for palpitations. Negative for chest pain and leg swelling.  Musculoskeletal:  Positive for myalgias (Calf  pain).      Objective:    Physical Exam Constitutional:      General: She is not in acute distress.    Appearance: Normal appearance. She is not ill-appearing.  HENT:     Head: Normocephalic and atraumatic.     Right Ear: External ear normal.     Left Ear: External ear normal.  Eyes:     Extraocular Movements: Extraocular movements intact.     Pupils: Pupils are equal, round, and reactive to light.  Cardiovascular:     Rate and Rhythm: Normal rate and regular rhythm.     Heart sounds: Normal heart sounds. No murmur heard.   No gallop.  Pulmonary:     Effort: Pulmonary effort is normal. No respiratory distress.     Breath sounds: Normal breath sounds. No wheezing or rales.     Comments: Dyspnea on exertion  Musculoskeletal:     Right lower leg: 1+ Edema present.     Left lower leg: 1+ Edema present.  Skin:    General: Skin is warm and dry.  Neurological:     Mental Status: She is alert and oriented to person, place, and time.  Psychiatric:        Behavior: Behavior normal.    BP (!) 156/86 (BP Location: Right Arm, Patient Position: Sitting, Cuff Size: Large)   Pulse 64   Temp 98.8 F (37.1 C) (Oral)   Resp 16   Ht 5' 1"  (1.549 m)   Wt 219 lb (99.3 kg)   LMP 08/09/2016   SpO2 100%   BMI 41.38 kg/m  Wt Readings from Last 3 Encounters:  08/29/20 219 lb (99.3 kg)  08/05/20 215 lb (97.5 kg)  05/25/20 212 lb (96.2 kg)       Assessment & Plan:   Problem List Items Addressed This Visit       Unprioritized   Palpitations   Relevant Orders   Ambulatory referral to Cardiology   TSH   CBC with Differential/Platelet   Essential hypertension    BP was elevated today, however pt stated she had not yet taken her blood pressure medication.       Relevant Medications   furosemide (LASIX) 20 MG tablet   DOE (dyspnea on exertion) - Primary    Wt  Readings from Last 3 Encounters:  08/29/20 219 lb (99.3 kg)  08/05/20 215 lb (97.5 kg)  05/25/20 212 lb (96.2 kg)  EKG tracing is personally reviewed.  EKG notes NSR.  No acute changes.   I do want to check a D dimer to rule out PE.  Will also refer to cardiology for further evaluation.       Relevant Medications   fluticasone (FLOVENT HFA) 110 MCG/ACT inhaler   Other Relevant Orders   EKG 12-Lead (Completed)   Basic metabolic panel   D-Dimer, Quantitative   B Nat Peptide   Ambulatory referral to Cardiology   TSH   CBC with Differential/Platelet   Chronic diastolic CHF (congestive heart failure) ([place])    Weight is up 4 pounds. Not sure if this is the cause for her DOE, but will add lasix 8m once daily for 3 days to see if this helps.       Relevant Medications   furosemide (LASIX) 20 MG tablet   Asthma    Notes increased wheezing, though no wheezing noted on exam.  Will add flovent 1175m BID along with albuterol PRN.        Relevant Medications  fluticasone (FLOVENT HFA) 110 MCG/ACT inhaler     Meds ordered this encounter  Medications   fluticasone (FLOVENT HFA) 110 MCG/ACT inhaler    Sig: Inhale 1 puff into the lungs in the morning and at bedtime.    Dispense:  1 each    Refill:  3    Order Specific Question:   Supervising Provider    Answer:   Penni Homans A [[id number]]   furosemide (LASIX) 20 MG tablet    Sig: Take 1 tablet by mouth once daily for 3 days.    Dispense:  10 tablet    Refill:  0    Order Specific Question:   Supervising Provider    Answer:   Penni Homans A [[id number]]    I, [name] NP, personally preformed the services described in this documentation.  All medical record entries made by the scribe were at my direction and in my presence.  I have reviewed the chart and discharge instructions (if applicable) and agree that the record reflects my personal performance and is accurate and complete. 08/29/2020   I,Shehryar Baig,acting as a Education administrator  for [name], NP.,have documented all relevant documentation on the behalf of [name], NP,as directed by  [name], NP while in the presence of [name], NP.   [name], NP

## 2020-08-29 NOTE — Assessment & Plan Note (Addendum)
Weight is up 4 pounds. Not sure if this is the cause for her DOE, but will add lasix '20mg'$  once daily for 3 days to see if this helps.

## 2020-08-30 ENCOUNTER — Encounter: Payer: Self-pay | Admitting: Family

## 2020-08-30 NOTE — Telephone Encounter (Signed)
Unfortunately, it doesn't appear that her insurance covers any of the inhaled steroid medications. Let's skip it and have her take the lasix and albuterol.

## 2020-08-30 NOTE — Telephone Encounter (Signed)
Patient advised of medication coverage problem and provider's advise

## 2020-09-05 ENCOUNTER — Telehealth: Payer: Self-pay | Admitting: Family

## 2020-09-05 DIAGNOSIS — I10 Essential (primary) hypertension: Secondary | ICD-10-CM

## 2020-09-05 NOTE — Telephone Encounter (Signed)
See mychart.  

## 2020-09-06 MED ORDER — FUROSEMIDE 20 MG PO TABS: 20.0000 mg | ORAL_TABLET | Freq: Every day | ORAL | 3 refills | Status: DC

## 2020-09-06 NOTE — Addendum Note (Signed)
Addended by: [name] on: 09/06/2020 04:40 PM   Modules accepted: Orders

## 2020-09-13 LAB — HM DIABETES EYE EXAM

## 2020-09-15 ENCOUNTER — Other Ambulatory Visit: Payer: Self-pay

## 2020-09-15 MED ORDER — FLUTICASONE PROPIONATE HFA 110 MCG/ACT IN AERO
INHALATION_SPRAY | RESPIRATORY_TRACT | 1 refills | Status: DC
Start: 2020-09-15 — End: 2021-05-08

## 2020-09-26 DIAGNOSIS — Z8619 Personal history of other infectious and parasitic diseases: Secondary | ICD-10-CM | POA: Insufficient documentation

## 2020-09-26 DIAGNOSIS — K219 Gastro-esophageal reflux disease without esophagitis: Secondary | ICD-10-CM | POA: Insufficient documentation

## 2020-09-26 DIAGNOSIS — R062 Wheezing: Secondary | ICD-10-CM | POA: Insufficient documentation

## 2020-09-26 DIAGNOSIS — Z332 Encounter for elective termination of pregnancy: Secondary | ICD-10-CM

## 2020-09-26 DIAGNOSIS — K635 Polyp of colon: Secondary | ICD-10-CM | POA: Insufficient documentation

## 2020-09-26 HISTORY — DX: Encounter for elective termination of pregnancy: Z33.2

## 2020-09-26 HISTORY — DX: Personal history of other infectious and parasitic diseases: Z86.19

## 2020-09-30 ENCOUNTER — Ambulatory Visit: Payer: 59 | Admitting: Family

## 2020-10-03 ENCOUNTER — Ambulatory Visit: Payer: 59 | Admitting: Cardiology

## 2020-10-07 ENCOUNTER — Other Ambulatory Visit (HOSPITAL_COMMUNITY)
Admission: RE | Admit: 2020-10-07 | Discharge: 2020-10-07 | Disposition: A | Discharge status: Home / Self Care | Payer: 59 | Source: Ambulatory Visit | Attending: Family | Admitting: Family

## 2020-10-07 ENCOUNTER — Other Ambulatory Visit: Payer: Self-pay

## 2020-10-07 ENCOUNTER — Encounter: Payer: Self-pay | Admitting: Family

## 2020-10-07 ENCOUNTER — Ambulatory Visit (INDEPENDENT_AMBULATORY_CARE_PROVIDER_SITE_OTHER): Payer: 59 | Admitting: Family

## 2020-10-07 VITALS — BP 117/61 | HR 77 | Temp 98.6°F | Resp 16 | Ht 61.0 in | Wt 218.0 lb

## 2020-10-07 DIAGNOSIS — Z0001 Encounter for general adult medical examination with abnormal findings: Secondary | ICD-10-CM | POA: Diagnosis not present

## 2020-10-07 DIAGNOSIS — R0609 Other forms of dyspnea: Secondary | ICD-10-CM

## 2020-10-07 DIAGNOSIS — Z01419 Encounter for gynecological examination (general) (routine) without abnormal findings: Secondary | ICD-10-CM

## 2020-10-07 DIAGNOSIS — Z0000 Encounter for general adult medical examination without abnormal findings: Secondary | ICD-10-CM

## 2020-10-07 DIAGNOSIS — Z23 Encounter for immunization: Secondary | ICD-10-CM | POA: Diagnosis not present

## 2020-10-07 DIAGNOSIS — I10 Essential (primary) hypertension: Secondary | ICD-10-CM

## 2020-10-07 NOTE — Assessment & Plan Note (Addendum)
Wt Readings from Last 3 Encounters:  10/07/20 218 lb (98.9 kg)  08/29/20 219 lb (99.3 kg)  08/05/20 215 lb (97.5 kg)   Unchanged.  Swelling is better with daily furosemide 20mg . Check follow up bmet. Continue furosemide. She had trouble getting cardiology visit rescheduled by phone. I advised her to walk in upstairs to the desk to schedule.

## 2020-10-07 NOTE — Addendum Note (Signed)
Addended by: Jiles Prows on: 10/07/2020 01:27 PM   Modules accepted: Orders

## 2020-10-07 NOTE — Patient Instructions (Addendum)
My Fitness Pal to help you keep track of your calories each day. Please complete lab work prior to leaving.

## 2020-10-07 NOTE — Assessment & Plan Note (Signed)
BP Readings from Last 3 Encounters:  10/07/20 117/61  08/29/20 (!) 156/86  08/05/20 139/72   BP stable. Continue amlodipine 5mg  and toprol xl 100mg .

## 2020-10-07 NOTE — Assessment & Plan Note (Addendum)
Discussed healthy diet, exercise.  Specifically recommended cutting back on sugars, counting calories using my fitness pal and increasing lean proteins in her diet. She will schedule mammo for December. Colo up to date for 1 more year.

## 2020-10-07 NOTE — Progress Notes (Signed)
Subjective:   By signing my name below, I, Amber [name], attest that this documentation has been prepared under the direction and in the presence of [name], NP, 10/07/2020   Patient ID: Amber [name], female    DOB: 03/01/1960, 60 y.o.   MRN: [id number]  Chief Complaint  Patient presents with   Annual Exam         HPI Patient is in today for a comprehensive physical exam.   SOB with exertion: She is still experiencing SOB with exertion. She missed her appointment with cardiology and is waiting to reschedule this appointment.  Weight concern: She is concerned about her weight at this time and does not she has been struggling with too much sugar intake and fried food.  Shoulder pain: She is still experiencing shoulder pain and has been diagnosed with encapsulated frozen shoulder.  Medications: She is compliant in taking 20 mg lasix.  She denies any fever, unexpected weight change, adenopathy, rash, hearing loss, ear pain, rhinorrhea, visual disturbances, eye pain, chest pain, leg swelling, cough, nausea, vomitting, diarrhea, blood in stool, dysuria, frequency, myalgias, arthralgias, depression or anxiety.  Immunizations: She is UTD on Shingrix and tetanus. She is UTD of pneumonia vaccine. She will receive her flu shot in the office today.  Diet: She is not maintaining a healthy diet. Exercise: She is not exercising regularly.  Colonoscopy: Last performed on 12/07/2011 and found 2 diminutive polyps in the mid-left colon and proximal right colon respectively which were removed. It also found 6-7 mm sessile right colon polyps which were also removed. Remaining results were normal. Repeat in 5 years. Due.  Dexa: Last completed on 09/30/2017 and results were in normal range for her age.  Pap Smear: Last performed on 01/02/2015 and results were normal. She is receiving an updated pap smear today in the office.  Mammogram: Last performed on 10/19/2019 and results were  normal. Repeat in one year. Due.  Shx: She notes no new surgeries in the last year.   Health Maintenance Due  Topic Date Due   COVID-19 Vaccine (4 - Booster for Moderna series) 02/19/2020   INFLUENZA VACCINE  08/01/2020   FOOT EXAM  09/20/2020   PAP SMEAR-Modifier  09/30/2020    Past Medical History:  Diagnosis Date   Anemia    Arthritis    Asthma 05/03/2020   Blood transfusion without reported diagnosis    Colon polyps    Controlled type 2 diabetes mellitus without complication, without long-term current use of insulin ([place]) 05/31/2017   Ectopic pregnancy    RIGHT salpingostomy   Elective abortion    GERD (gastroesophageal reflux disease)    Glaucoma    both eyes   Headache    migraines   History of chicken pox    Hyperlipemia    Hypertension    Rheumatoid arthritis(714.0) 11/07/2011   SAB (spontaneous abortion)    SAB (spontaneous abortion) 11/07/2011   Shortness of breath    Sjogren's disease ([place])    Wheezing     Past Surgical History:  Procedure Laterality Date   ABDOMINAL SURGERY     bowel repair   COLONOSCOPY     DILATION AND CURETTAGE OF UTERUS     X 2   MENISCUS REPAIR Right    MENISCUS TEAR REPAIR   MYOMECTOMY N/A 07/27/2014   Procedure: ABDOMINAL MYOMECTOMY;  Surgeon: Waymon Amato, MD;  Location: [facility];  Service: Gynecology;  Laterality: N/A;   PELVIC LAPAROSCOPY  1994   IN  1999 LAPAROSCOPY WITH INCIDENTAL ENTEROTOMY REQUIRING LAPAROTOMY   UTERINE FIBROID SURGERY     July 2016    Family History  Problem Relation Age of Onset   Hypertension Mother        died from heart disease   Heart disease Mother        deceased, unknown cause   Stroke Mother    Obesity Mother    Diabetes Sister    Hypertension Sister    Esophageal cancer Sister    Stomach cancer Sister    Cancer Sister    Diabetes Sister    Rectal cancer Neg Hx    Colon cancer Neg Hx     Social History   Socioeconomic History   Marital status: Single    Spouse name: Not on file    Number of children: 0   Years of education: college   Highest education level: Not on file  Occupational History   Occupation: LPN    Employer: [employer]  Tobacco Use   Smoking status: Never   Smokeless tobacco: Never  Vaping Use   Vaping Use: Never used  Substance and Sexual Activity   Alcohol use: No    Alcohol/week: 0.0 standard drinks   Drug use: No   Sexual activity: Yes    Birth control/protection: None    Comment: 1st intercourse- 69, partners- 36  Other Topics Concern   Not on file  Social History Narrative   Regular exercise:  No   Caffeine Use: 2 cups coffee daily   No children   "Happily divorced"   Reports that she is involved at her church   Works as an Therapist, 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  Had a sister up in [place] who passed away 26-Mar-2015 who she was close to.   Born in [place]- she came to [place] as adult.  Age 84.    Left-handed.         Social Determinants of Health   Financial Resource Strain: Not on file  Food Insecurity: Not on file  Transportation Needs: Not on file  Physical Activity: Not on file  Stress: Not on file  Social Connections: Not on file  Intimate Partner Violence: Not on file    Outpatient Medications Prior to Visit  Medication Sig Dispense Refill   Abatacept (ORENCIA CLICKJECT) 478 MG/ML SOAJ Inject 17m subcutaneously every 10 days 4 mL 1   albuterol (VENTOLIN HFA) 108 (90 Base) MCG/ACT inhaler Inhale 2 puffs into the lungs every 6 (six) hours as needed for wheezing or shortness of breath. 18 g 5   amLODipine (NORVASC) 5 MG tablet Take 1 tablet (5 mg total) by mouth daily. 90 tablet 1   aspirin EC 81 MG tablet Take 1 tablet (81 mg total) by mouth daily. 90 tablet 3   blood glucose meter kit and supplies KIT Dispense based on patient and insurance preference. Use up to four times daily as directed. (FOR ICD-9 250.00, 250.01). 1 each 0   Blood Pressure Monitoring (ADULT BLOOD PRESSURE CUFF LG) KIT Use as directed 1 kit 0   calcium-vitamin D  (OSCAL WITH D) 500-200 MG-UNIT TABS tablet Take by mouth.     Cyanocobalamin (VITAMIN B-12) 5000 MCG LOZG Take 1 tablet by mouth 2 (two) times daily.     fluticasone (FLOVENT HFA) 110 MCG/ACT inhaler 2 puffs into the lungs every 12 hours as needed. 1 each 1   furosemide (LASIX) 20 MG tablet Take 1 tablet (20 mg total) by mouth daily.  30 tablet 3   hydrochlorothiazide (HYDRODIURIL) 25 MG tablet Take 1 tablet (25 mg total) by mouth daily. 90 tablet 1   Lifitegrast (XIIDRA) 5 % SOLN Place 1 drop into both eyes 2 times daily.     lisinopril (ZESTRIL) 40 MG tablet Take 1 tablet (40 mg total) by mouth daily. 90 tablet 1   metoprolol succinate (TOPROL-XL) 100 MG 24 hr tablet Take 1 tablet (100 mg total) by mouth daily. Take with or immediately following a meal. 90 tablet 1   SUMAtriptan (IMITREX) 50 MG tablet TAKE 1 TABLET BY MOUTH EVERY 2 HOURS AS NEEDED FOR  MIGRAINE  MAY  REPEAT  IN  2  HOURS  IF  HEADACHE  PERSISTS  OR  RECURS 9 tablet 5   TRAVATAN Z 0.004 % SOLN ophthalmic solution Place 1 drop into both eyes at bedtime.      No facility-administered medications prior to visit.    No Known Allergies  Review of Systems  Constitutional:  Negative for fever.       (-) unexpected weight changes  (-) adenopathy   HENT:  Negative for ear pain and hearing loss.        (-) rhinorrhea   Eyes:  Negative for pain.       (-) visual disturbances   Respiratory:  Negative for cough.   Cardiovascular:  Negative for chest pain and leg swelling.  Gastrointestinal:  Negative for blood in stool, diarrhea, nausea and vomiting.  Genitourinary:  Negative for dysuria and frequency.  Musculoskeletal:  Negative for joint pain and myalgias.  Skin:  Negative for rash.  Neurological:  Positive for headaches (frequent medicated with aleve and cold water).  Psychiatric/Behavioral:  Negative for depression. The patient is not nervous/anxious.       Objective:    Physical Exam Exam conducted with a chaperone  present.  Constitutional:      General: She is not in acute distress.    Appearance: Normal appearance. She is not ill-appearing.  HENT:     Head: Normocephalic and atraumatic.     Right Ear: Tympanic membrane, ear canal and external ear normal.     Left Ear: Tympanic membrane, ear canal and external ear normal.  Eyes:     Extraocular Movements: Extraocular movements intact.     Pupils: Pupils are equal, round, and reactive to light.     Comments: (-) nystagmus  Cardiovascular:     Rate and Rhythm: Normal rate and regular rhythm.     Heart sounds: Normal heart sounds. No murmur heard.   No gallop.  Pulmonary:     Effort: Pulmonary effort is normal. No respiratory distress.     Breath sounds: Normal breath sounds. No wheezing or rales.  Chest:  Breasts:    Right: Normal.     Left: Normal.     Comments: (+) right nipple retracted but not new for patient Abdominal:     General: Bowel sounds are normal.     Tenderness: There is no abdominal tenderness.  Genitourinary:    General: Normal vulva.     Vagina: Normal.     Cervix: Normal.     Uterus: Normal.      Adnexa: Right adnexa normal.  Musculoskeletal:     Comments: (+) 5/5 strength in upper and lower extremities   Lymphadenopathy:     Cervical: No cervical adenopathy.  Skin:    General: Skin is warm and dry.  Neurological:     Mental Status: She  is alert and oriented to person, place, and time.     Deep Tendon Reflexes:     Reflex Scores:      Patellar reflexes are 1+ on the right side and 2+ on the left side. Psychiatric:        Behavior: Behavior normal.        Judgment: Judgment normal.    BP 117/61 (BP Location: Right Arm, Patient Position: Sitting, Cuff Size: Large)   Pulse 77   Temp 98.6 F (37 C) (Oral)   Resp 16   Ht 5' 1"  (1.549 m)   Wt 218 lb (98.9 kg)   LMP 08/09/2016   SpO2 99%   BMI 41.19 kg/m  Wt Readings from Last 3 Encounters:  10/07/20 218 lb (98.9 kg)  08/29/20 219 lb (99.3 kg)  08/05/20  215 lb (97.5 kg)       Assessment & Plan:   Problem List Items Addressed This Visit       Unprioritized   Preventative health care - Primary    Discussed healthy diet, exercise.  Specifically recommended cutting back on sugars, counting calories using my fitness pal and increasing lean proteins in her diet. She will schedule mammo for December. Colo up to date for 1 more year.        Relevant Orders   MM 3D SCREEN BREAST BILATERAL   Essential hypertension    BP Readings from Last 3 Encounters:  10/07/20 117/61  08/29/20 (!) 156/86  08/05/20 139/72  BP stable. Continue amlodipine 14m and toprol xl 104m       Relevant Orders   Basic metabolic panel   DOE (dyspnea on exertion)    Wt Readings from Last 3 Encounters:  10/07/20 218 lb (98.9 kg)  08/29/20 219 lb (99.3 kg)  08/05/20 215 lb (97.5 kg)  Unchanged.  Swelling is better with daily furosemide 2077mCheck follow up bmet. Continue furosemide. She had trouble getting cardiology visit rescheduled by phone. I advised her to walk in upstairs to the desk to schedule.       Other Visit Diagnoses     Encounter for routine gynecological examination with Papanicolaou smear of cervix       Relevant Orders   Cytology - PAP( [place])      No orders of the defined types were placed in this encounter.   I, MelDebbrah AlarP, personally preformed the services described in this documentation.  All medical record entries made by the scribe were at my direction and in my presence.  I have reviewed the chart and discharge instructions (if applicable) and agree that the record reflects my personal performance and is accurate and complete. 10/07/2020  I,Amber [name],acting as a scrEducation administratorr MelNance PearP.,have documented all relevant documentation on the behalf of MelNance PearP,as directed by  MelNance PearP while in the presence of MelNance PearP.  MelNance PearP

## 2020-10-11 LAB — CYTOLOGY - PAP
Adequacy: ABSENT
Comment: NEGATIVE
Diagnosis: NEGATIVE
High risk HPV: POSITIVE — AB

## 2020-10-12 ENCOUNTER — Telehealth: Payer: Self-pay | Admitting: Family

## 2020-10-12 NOTE — Addendum Note (Signed)
Addended by: Jiles Prows on: 10/12/2020 03:48 PM   Modules accepted: Orders

## 2020-10-12 NOTE — Telephone Encounter (Signed)
Looks like patient forgot to complete bmet.  Please schedule lab visit.

## 2020-10-12 NOTE — Telephone Encounter (Signed)
Patient scheduled to come back for labs on Friday.

## 2020-10-13 ENCOUNTER — Encounter: Payer: Self-pay | Admitting: Neurology

## 2020-10-13 ENCOUNTER — Ambulatory Visit (INDEPENDENT_AMBULATORY_CARE_PROVIDER_SITE_OTHER): Payer: 59 | Admitting: Neurology

## 2020-10-13 VITALS — BP 122/74 | HR 74 | Ht 61.0 in | Wt 217.1 lb

## 2020-10-13 DIAGNOSIS — G43009 Migraine without aura, not intractable, without status migrainosus: Secondary | ICD-10-CM | POA: Diagnosis not present

## 2020-10-13 DIAGNOSIS — I679 Cerebrovascular disease, unspecified: Secondary | ICD-10-CM | POA: Diagnosis not present

## 2020-10-13 HISTORY — DX: Cerebrovascular disease, unspecified: I67.9

## 2020-10-13 MED ORDER — TOPIRAMATE 25 MG PO TABS: ORAL_TABLET | ORAL | 3 refills | Status: DC

## 2020-10-13 NOTE — Patient Instructions (Signed)
We will start Topamax for headache prevention.  Topamax (topiramate) is a seizure medication that has an FDA approval for seizures and for migraine headache. Potential side effects of this medication include weight loss, cognitive slowing, tingling in the fingers and toes, and carbonated drinks will taste bad. If any significant side effects are noted on this drug, please contact our office.

## 2020-10-13 NOTE — Progress Notes (Signed)
Reason for visit: Headaches, cerebrovascular disease  Amber [name] is an 60 y.o. female  History of present illness:  Amber [name] is a 60 year old left-handed black female with a history of rheumatoid arthritis, Sjogren's syndrome, and prediabetes.  The patient has migraine headaches, she continues to have 3-5 headaches a month on average.  She is not missing work usually, the headaches may last up to 2 hours.  She may take Aleve, occasionally she may take sumatriptan.  MRI of the brain shows white matter changes that could be consistent with CADISIL.  Genetic testing however is not fully covered through her insurance, she would have to pay over $1000 for the genetic test procedure.  The patient is not able to afford the evaluation.  She comes to the office today for an evaluation.  Past Medical History:  Diagnosis Date   Anemia    Arthritis    Asthma 05/03/2020   Blood transfusion without reported diagnosis    Colon polyps    Controlled type 2 diabetes mellitus without complication, without long-term current use of insulin ([place]) 05/31/2017   Ectopic pregnancy    RIGHT salpingostomy   Elective abortion    GERD (gastroesophageal reflux disease)    Glaucoma    both eyes   Headache    migraines   History of chicken pox    Hyperlipemia    Hypertension    Rheumatoid arthritis(714.0) 11/07/2011   SAB (spontaneous abortion)    SAB (spontaneous abortion) 11/07/2011   Shortness of breath    Sjogren's disease ([place])    Wheezing     Past Surgical History:  Procedure Laterality Date   ABDOMINAL SURGERY     bowel repair   COLONOSCOPY     DILATION AND CURETTAGE OF UTERUS     X 2   MENISCUS REPAIR Right    MENISCUS TEAR REPAIR   MYOMECTOMY N/A 07/27/2014   Procedure: ABDOMINAL MYOMECTOMY;  Surgeon: Waymon Amato, MD;  Location: [facility];  Service: Gynecology;  Laterality: N/A;   PELVIC LAPAROSCOPY  1994   IN 1999 LAPAROSCOPY WITH INCIDENTAL ENTEROTOMY REQUIRING LAPAROTOMY   UTERINE  FIBROID SURGERY     July 2016    Family History  Problem Relation Age of Onset   Hypertension Mother        died from heart disease   Heart disease Mother        deceased, unknown cause   Stroke Mother    Obesity Mother    Diabetes Sister    Hypertension Sister    Esophageal cancer Sister    Stomach cancer Sister    Cancer Sister    Diabetes Sister    Rectal cancer Neg Hx    Colon cancer Neg Hx     Social history:  reports that she has never smoked. She has never used smokeless tobacco. She reports that she does not drink alcohol and does not use drugs.   No Known Allergies  Medications:  Prior to Admission medications   Medication Sig Start Date End Date Taking? Authorizing Provider  Abatacept (ORENCIA CLICKJECT) 947 MG/ML SOAJ Inject 129m subcutaneously every 10 days 05/06/20  Yes Deveshwar, SAbel Presto MD  albuterol (VENTOLIN HFA) 108 (90 Base) MCG/ACT inhaler Inhale 2 puffs into the lungs every 6 (six) hours as needed for wheezing or shortness of breath. 10/12/19  Yes ODebbrah Alar NP  amLODipine (NORVASC) 5 MG tablet Take 1 tablet (5 mg total) by mouth daily. 08/05/20  Yes ODebbrah Alar NP  aspirin EC 81 MG tablet Take 1 tablet (81 mg total) by mouth daily. 02/01/20  Yes [name], NP  blood glucose meter kit and supplies KIT Dispense based on patient and insurance preference. Use up to four times daily as directed. (FOR ICD-9 250.00, 250.01). 02/03/20  Yes [name], NP  Blood Pressure Monitoring (ADULT BLOOD PRESSURE CUFF LG) KIT Use as directed 02/03/20  Yes [name], NP  calcium-vitamin D (OSCAL WITH D) 500-200 MG-UNIT TABS tablet Take by mouth.   Yes [provider]  Cyanocobalamin (VITAMIN B-12) 5000 MCG LOZG Take 1 tablet by mouth 2 (two) times daily. 11/04/15  Yes [provider]  fluticasone (FLOVENT HFA) 110 MCG/ACT inhaler 2 puffs into the lungs every 12 hours as needed. 09/15/20  Yes [name], NP   furosemide (LASIX) 20 MG tablet Take 1 tablet (20 mg total) by mouth daily. 09/06/20  Yes [name], NP  hydrochlorothiazide (HYDRODIURIL) 25 MG tablet Take 1 tablet (25 mg total) by mouth daily. 08/05/20  Yes [name], NP  Lifitegrast Shirley Friar) 5 % SOLN Place 1 drop into both eyes 2 times daily. 12/09/18  Yes [provider]  lisinopril (ZESTRIL) 40 MG tablet Take 1 tablet (40 mg total) by mouth daily. 08/05/20  Yes [name], NP  metoprolol succinate (TOPROL-XL) 100 MG 24 hr tablet Take 1 tablet (100 mg total) by mouth daily. Take with or immediately following a meal. 08/05/20  Yes [name], NP  SUMAtriptan (IMITREX) 50 MG tablet TAKE 1 TABLET BY MOUTH EVERY 2 HOURS AS NEEDED FOR  MIGRAINE  MAY  REPEAT  IN  2  HOURS  IF  HEADACHE  PERSISTS  OR  RECURS 08/05/20  Yes [name], NP  TRAVATAN Z 0.004 % SOLN ophthalmic solution Place 1 drop into both eyes at bedtime.  10/25/11  Yes [provider]    ROS:  Out of a complete 14 system review of symptoms, the patient complains only of the following symptoms, and all other reviewed systems are negative.  Headache  Blood pressure 122/74, pulse 74, height 5' 1"  (1.549 m), weight 217 lb 2 oz (98.5 kg), last menstrual period 08/09/2016, SpO2 97 %.  Physical Exam  General: The patient is alert and cooperative at the time of the examination.  The patient is moderately obese.  Skin: No significant peripheral edema is noted.   Neurologic Exam  Mental status: The patient is alert and oriented x 3 at the time of the examination. The patient has apparent normal recent and remote memory, with an apparently normal attention span and concentration ability.   Cranial nerves: Facial symmetry is present. Speech is normal, no aphasia or dysarthria is noted. Extraocular movements are full. Visual fields are full.  Motor: The patient has good strength in all 4 extremities.  Sensory examination: Soft  touch sensation is symmetric on the face, arms, and legs.  Coordination: The patient has good finger-nose-finger and heel-to-shin bilaterally.  Gait and station: The patient has a normal gait. Tandem gait is slightly unsteady. Romberg is negative. No drift is seen.  Reflexes: Deep tendon reflexes are symmetric.   Assessment/Plan:  1.  Migraine headache  2.  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  She will follow-up here in 4 months, in the future she can be followed through Dr. Billey Gosling.  Jill Alexanders MD 10/13/2020 8:57 AM  [facility] [address] [place],  [place] [address]  Phone [phone] Fax [phone]

## 2020-10-13 NOTE — Progress Notes (Signed)
Office Visit Note  Patient: Amber [name]             Date of Birth: 1960/07/19           MRN: [id number]             PCP: [name], NP Referring: [name], NP Visit Date: 10/26/2020 Occupation: @GUAROCC @  Subjective:  Medication management.   History of Present Illness: Amber [name] is a 60 y.o. female with a history of rheumatoid arthritis, Sjogren's and osteoarthritis.  She has been tolerating Orencia subcutaneous injections well.  She continues to have some discomfort in her left shoulder.  She feels a stiffness in her feet due to pedal edema.  She continues to have dry mouth and dry eyes symptoms.  She has not been using over-the-counter products.  Activities of Daily Living:  Patient reports morning stiffness for 5 minutes.   Patient Reports nocturnal pain.  Difficulty dressing/grooming: Denies Difficulty climbing stairs: Denies Difficulty getting out of chair: Denies Difficulty using hands for taps, buttons, cutlery, and/or writing: Denies  Review of Systems  Constitutional:  Negative for fatigue.  HENT:  Positive for mouth dryness. Negative for mouth sores and nose dryness.   Eyes:  Positive for dryness. Negative for pain and itching.  Respiratory:  Positive for shortness of breath. Negative for difficulty breathing.   Cardiovascular:  Positive for swelling in legs/feet. Negative for chest pain and palpitations.  Gastrointestinal:  Negative for blood in stool, constipation and diarrhea.  Endocrine: Negative for increased urination.  Genitourinary:  Negative for difficulty urinating.  Musculoskeletal:  Positive for joint pain, joint pain, myalgias, morning stiffness, muscle tenderness and myalgias. Negative for joint swelling.  Skin:  Negative for color change, rash, redness and sensitivity to sunlight.  Allergic/Immunologic: Negative for susceptible to infections.  Neurological:  Positive for numbness and headaches. Negative for  dizziness, memory loss and weakness.  Hematological:  Negative for bruising/bleeding tendency.  Psychiatric/Behavioral:  Negative for confusion.    PMFS History:  Patient Active Problem List   Diagnosis Date Noted   High risk HPV infection 10/17/2020   Cerebrovascular disease 10/13/2020   Colon polyps 09/26/2020   Elective abortion 09/26/2020   GERD (gastroesophageal reflux disease) 09/26/2020   History of chicken pox 09/26/2020   Wheezing 09/26/2020   B12 deficiency 05/03/2020   Hyperlipidemia 05/03/2020   Asthma 05/03/2020   Diabetic peripheral neuropathy ([place]) 01/05/2020   Class 2 severe obesity with serious comorbidity and body mass index (BMI) of 38.0 to 38.9 in adult, unspecified obesity type ([place]) 01/05/2020   Adrenal mass, right ([place]) 01/05/2020   Other fatigue 09/04/2017   DOE (dyspnea on exertion) 09/04/2017   Essential hypertension 09/04/2017   Controlled type 2 diabetes mellitus without complication, without long-term current use of insulin ([place]) 05/31/2017   Chronic diastolic CHF (congestive heart failure) ([place]) 04/24/2017   CAD (coronary artery disease) 04/24/2017   History of BCG vaccination 06/14/2016   Fibroids 07/27/2014   Migraine 04/19/2014   Nail fungus 04/09/2014   Elevated serum protein level 04/09/2014   Preventative health care 04/09/2014   Iron deficiency anemia 09/02/2013   Headache(784.0) 03/04/2013   Obesity (BMI 30-39.9) 10/17/2012   Palpitations 10/01/2012   Glaucoma 11/07/2011   Rheumatoid arthritis ([place]) 11/07/2011   Sjogren's disease ([place]) 11/05/2011   Anemia 09/22/2010   Fibroid uterus 09/22/2010   Neutropenia ([place]) 09/22/2010   Dysmenorrhea 09/15/2010    Past Medical History:  Diagnosis Date   Anemia  Arthritis    Asthma 05/03/2020   Blood transfusion without reported diagnosis    Cerebrovascular disease 10/13/2020   Colon polyps    Controlled type 2 diabetes mellitus without complication, without long-term current use of insulin  ([place]) 05/31/2017   Ectopic pregnancy    RIGHT salpingostomy   Elective abortion    GERD (gastroesophageal reflux disease)    Glaucoma    both eyes   Headache    migraines   History of chicken pox    Hyperlipemia    Hypertension    Rheumatoid arthritis(714.0) 11/07/2011   SAB (spontaneous abortion)    SAB (spontaneous abortion) 11/07/2011   Shortness of breath    Sjogren's disease ([place])    Wheezing     Family History  Problem Relation Age of Onset   Hypertension Mother        died from heart disease   Heart disease Mother        deceased, unknown cause   Stroke Mother    Obesity Mother    Diabetes Sister    Hypertension Sister    Esophageal cancer Sister    Stomach cancer Sister    Cancer Sister    Diabetes Sister    Rectal cancer Neg Hx    Colon cancer Neg Hx    Past Surgical History:  Procedure Laterality Date   ABDOMINAL SURGERY     bowel repair   COLONOSCOPY     DILATION AND CURETTAGE OF UTERUS     X 2   MENISCUS REPAIR Right    MENISCUS TEAR REPAIR   MYOMECTOMY N/A 07/27/2014   Procedure: ABDOMINAL MYOMECTOMY;  Surgeon: Waymon Amato, MD;  Location: [facility];  Service: Gynecology;  Laterality: N/A;   PELVIC LAPAROSCOPY  1994   IN 1999 LAPAROSCOPY WITH INCIDENTAL ENTEROTOMY REQUIRING LAPAROTOMY   UTERINE FIBROID SURGERY     July 2016   Social History   Social History Narrative   Regular exercise:  No   Caffeine Use: 2 cups coffee daily   No children   "Happily divorced"   Reports that she is involved at her church   Works as an Therapist, 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  Had a sister up in [place] who passed away 04/15/2015 who she was close to.   Born in [place]- she came to [place] as adult.  Age 41.    Left-handed.         Immunization History  Administered Date(s) Administered   Hepatitis A, Adult 11/01/2017, 12/03/2018   IPV 11/01/2017   Influenza Split 11/07/2011   Influenza,inj,Quad PF,6+ Mos 10/17/2012, 01/15/2014, 08/31/2014, 09/30/2017, 09/10/2018, 09/21/2019,  10/07/2020   Meningococcal Mcv4o 11/01/2017   Moderna Sars-Covid-2 Vaccination 01/28/2019, 02/27/2019, 11/27/2019   Pneumococcal Conjugate-13 08/05/2020   Pneumococcal Polysaccharide-23 09/10/2018   Tdap 06/01/2011, 11/25/2012   Typhoid Live 11/01/2017   Zoster Recombinat (Shingrix) 10/04/2017, 12/04/2017     Objective: Vital Signs: BP 114/75 (BP Location: Left Arm, Patient Position: Sitting, Cuff Size: Large)   Pulse 67   Ht 5\' 1"  (1.549 m)   Wt 219 lb 6.4 oz (99.5 kg)   LMP 08/09/2016   BMI 41.46 kg/m    Physical Exam Vitals and nursing note reviewed.  Constitutional:      Appearance: She is well-developed.  HENT:     Head: Normocephalic and atraumatic.  Eyes:     Conjunctiva/sclera: Conjunctivae normal.  Cardiovascular:     Rate and Rhythm: Normal rate and regular rhythm.     Heart sounds: Normal  heart sounds.  Pulmonary:     Effort: Pulmonary effort is normal.     Breath sounds: Normal breath sounds.  Abdominal:     General: Bowel sounds are normal.     Palpations: Abdomen is soft.  Musculoskeletal:     Cervical back: Normal range of motion.  Lymphadenopathy:     Cervical: No cervical adenopathy.  Skin:    General: Skin is warm and dry.     Capillary Refill: Capillary refill takes less than 2 seconds.  Neurological:     Mental Status: She is alert and oriented to person, place, and time.  Psychiatric:        Behavior: Behavior normal.     Musculoskeletal Exam: C-spine was in good range of motion.  She is some stiffness with range of motion of her shoulder joints.  Elbow joints, wrist joints, MCPs PIPs and DIPs with good range of motion with no synovitis.  Hip joints, knee joints, ankles, MTPs and PIPs with good range of motion with no synovitis. CDAI Exam: CDAI Score: 1.6  Patient Global: 3 mm; Provider Global: 3 mm Swollen: 0 ; Tender: 1  Joint Exam 10/26/2020      Right  Left  Glenohumeral      Tender     Investigation: No additional  findings.  Imaging: No results found.  Recent Labs: Lab Results  Component Value Date   WBC 3.1 (L) 08/29/2020   HGB 12.3 08/29/2020   PLT 220.0 08/29/2020   NA 139 10/14/2020   K 4.4 10/14/2020   CL 103 10/14/2020   CO2 28 10/14/2020   GLUCOSE 106 (H) 10/14/2020   BUN 13 10/14/2020   CREATININE 0.67 10/14/2020   BILITOT 0.8 07/12/2020   ALKPHOS 62 09/10/2018   AST 24 07/12/2020   ALT 23 07/12/2020   PROT 7.8 07/12/2020   ALBUMIN 4.0 09/10/2018   CALCIUM 9.8 10/14/2020   GFRAA 115 02/19/2020   QFTBGOLD Negative 06/14/2016   QFTBGOLDPLUS NEGATIVE 01/20/2020    Speciality Comments: PLQ eye exam: 12/09/2018 normal. [facility].  Procedures:  No procedures performed Allergies: Patient has no known allergies.   Assessment / Plan:     Visit Diagnoses: Rheumatoid arthritis involving multiple sites with positive rheumatoid factor (HCC) - Positive RF, negative anti-CCP, 14 3 3  eta positive, hx of rheumatoid arthritis for the last 10 years. previously under the care of Dr. Graylon Gunning & [name]. -She is clinically doing much better on Orencia.  She had no synovitis on examination.  She has been tolerating Orencia well.  Plan: Rheumatoid factor  High risk medication use - Orencia 125 mg subcutaneous every 10 days. Initially started on Orencia 01/28/20. (methotrexate was discontinued due to neutropenia).  Her last labs were CBC August 29, 2020 showed WBC count of 3.1 which has been stable.  CMP was in July 2022.  BMP October 14, 2020 was normal.-I will obtain CBC with differential and AST and ALT today.  Plan: CBC with Differential/Platelet, COMPLETE METABOLIC PANEL WITH GFR, QuantiFERON-TB Gold Plus in January 2023.  She has been advised to hold Orencia in case she develops an infection.  Information regarding ministration was also placed in the AVS  Sjogren's syndrome with other organ involvement ([place]) - Anti-Ro positive, anti-La positive: She continues to have dry mouth and dry  eyes.  Over-the-counter products were discussed.  I discussed possible use of pilocarpine.  She is also on a beta-blocker.  I advised her to discuss with the cardiologist if she  can take pilocarpine with the beta-blocker.  Primary osteoarthritis of both knees-she has been doing well without much discomfort in her knee joints.  Primary osteoarthritis of both feet-she is off-and-on discomfort in her feet.  Proper fitting shoes were discussed.  Plantar fasciitis of right foot - She was evaluated by Dr. Paulla Dolly on 03/07/2020 and was diagnosed with plantar fasciitis of the right foot.   Neck pain -the pain is mostly in the trapezius region.  Flexeril 5 mg 1 tablet by mouth at bedtime as needed for muscle spasms and pain relief.   Family history of rheumatoid arthritis - Sister.   Essential hypertension-blood pressure is normal today.  Coronary artery disease involving native coronary artery of native heart without angina pectoris  Controlled type 2 diabetes mellitus without complication, without long-term current use of insulin (HCC)  Chronic diastolic CHF (congestive heart failure) (HCC)  Palpitations  Hx of migraines  Other fatigue - Plan: Serum protein electrophoresis with reflex  Orders: Orders Placed This Encounter  Procedures   CBC with Differential/Platelet   Rheumatoid factor   Serum protein electrophoresis with reflex   QuantiFERON-TB Gold Plus   ALT   AST    No orders of the defined types were placed in this encounter.    Follow-Up Instructions: Return in about 3 months (around 01/26/2021) for Rheumatoid arthritis.   Bo Merino, MD  Note - This record has been created using Editor, commissioning.  Chart creation errors have been sought, but may not always  have been located. Such creation errors do not reflect on  the standard of medical care.

## 2020-10-14 ENCOUNTER — Other Ambulatory Visit: Payer: Self-pay

## 2020-10-14 ENCOUNTER — Other Ambulatory Visit (INDEPENDENT_AMBULATORY_CARE_PROVIDER_SITE_OTHER): Payer: 59

## 2020-10-14 DIAGNOSIS — I10 Essential (primary) hypertension: Secondary | ICD-10-CM

## 2020-10-14 LAB — BASIC METABOLIC PANEL
BUN: 13 mg/dL (ref 6–23)
CO2: 28 mEq/L (ref 19–32)
Calcium: 9.8 mg/dL (ref 8.4–10.5)
Chloride: 103 mEq/L (ref 96–112)
Creatinine, Ser: 0.67 mg/dL (ref 0.40–1.20)
GFR: 95.76 mL/min (ref >60.00)
Glucose, Bld: 106 mg/dL — ABNORMAL HIGH (ref 70–99)
Potassium: 4.4 mEq/L (ref 3.5–5.1)
Sodium: 139 mEq/L (ref 135–145)

## 2020-10-17 ENCOUNTER — Telehealth: Payer: Self-pay | Admitting: Family

## 2020-10-17 DIAGNOSIS — B977 Papillomavirus as the cause of diseases classified elsewhere: Secondary | ICD-10-CM | POA: Insufficient documentation

## 2020-10-17 NOTE — Telephone Encounter (Signed)
Patient advised of results, provider's comments and referral 

## 2020-10-17 NOTE — Telephone Encounter (Signed)
Please advise pt that her pap smear was normal, but she did test positive for high risk HPV.  I would recommend that she see GYN for a consultation and they will decide if further testing is needed.

## 2020-10-21 ENCOUNTER — Other Ambulatory Visit: Payer: Self-pay | Admitting: Rheumatology

## 2020-10-21 DIAGNOSIS — M0579 Rheumatoid arthritis with rheumatoid factor of multiple sites without organ or systems involvement: Secondary | ICD-10-CM

## 2020-10-21 NOTE — Telephone Encounter (Signed)
Next Visit: 10/26/2020  Last Visit: 05/25/2020  Last Fill: 05/06/2020  GN:[id number] arthritis involving multiple sites with positive rheumatoid factor  Current Dose per office note 05/25/2020: Orencia 125 mg subcutaneous every 10 days  Labs: 08/29/2020 WBC 3.1, Neutrophils Relative % 23.2, Lymphocytes Relative 66.5,  Neutro Abs 0.7 BMP WNL  TB Gold: 01/20/2020 Neg    Okay to refill Orencia?

## 2020-10-26 ENCOUNTER — Encounter: Payer: Self-pay | Admitting: Rheumatology

## 2020-10-26 ENCOUNTER — Ambulatory Visit: Payer: 59 | Admitting: Rheumatology

## 2020-10-26 ENCOUNTER — Other Ambulatory Visit: Payer: Self-pay

## 2020-10-26 VITALS — BP 114/75 | HR 67 | Ht 61.0 in | Wt 219.4 lb

## 2020-10-26 DIAGNOSIS — I10 Essential (primary) hypertension: Secondary | ICD-10-CM

## 2020-10-26 DIAGNOSIS — M0579 Rheumatoid arthritis with rheumatoid factor of multiple sites without organ or systems involvement: Secondary | ICD-10-CM

## 2020-10-26 DIAGNOSIS — E119 Type 2 diabetes mellitus without complications: Secondary | ICD-10-CM

## 2020-10-26 DIAGNOSIS — M19071 Primary osteoarthritis, right ankle and foot: Secondary | ICD-10-CM

## 2020-10-26 DIAGNOSIS — R5383 Other fatigue: Secondary | ICD-10-CM

## 2020-10-26 DIAGNOSIS — M722 Plantar fascial fibromatosis: Secondary | ICD-10-CM

## 2020-10-26 DIAGNOSIS — I5032 Chronic diastolic (congestive) heart failure: Secondary | ICD-10-CM

## 2020-10-26 DIAGNOSIS — M542 Cervicalgia: Secondary | ICD-10-CM

## 2020-10-26 DIAGNOSIS — Z8669 Personal history of other diseases of the nervous system and sense organs: Secondary | ICD-10-CM

## 2020-10-26 DIAGNOSIS — I2510 Atherosclerotic heart disease of native coronary artery without angina pectoris: Secondary | ICD-10-CM

## 2020-10-26 DIAGNOSIS — Z79899 Other long term (current) drug therapy: Secondary | ICD-10-CM | POA: Diagnosis not present

## 2020-10-26 DIAGNOSIS — M170 Bilateral primary osteoarthritis of knee: Secondary | ICD-10-CM | POA: Diagnosis not present

## 2020-10-26 DIAGNOSIS — M3509 Sicca syndrome with other organ involvement: Secondary | ICD-10-CM | POA: Diagnosis not present

## 2020-10-26 DIAGNOSIS — Z8261 Family history of arthritis: Secondary | ICD-10-CM

## 2020-10-26 DIAGNOSIS — M19072 Primary osteoarthritis, left ankle and foot: Secondary | ICD-10-CM

## 2020-10-26 DIAGNOSIS — R002 Palpitations: Secondary | ICD-10-CM

## 2020-10-26 NOTE — Patient Instructions (Signed)
Standing Labs We placed an order today for your standing lab work.   Please have your standing labs drawn in January and every 3 months TB Gold in January  If possible, please have your labs drawn 2 weeks prior to your appointment so that the provider can discuss your results at your appointment.  Please note that you may see your imaging and lab results in [place] before we have reviewed them. We may be awaiting multiple results to interpret others before contacting you. Please allow our office up to 72 hours to thoroughly review all of the results before contacting the office for clarification of your results.  We have open lab daily: Monday through Thursday from 1:30-4:30 PM and Friday from 1:30-4:00 PM at the office of Dr. Bo Merino, [facility].   Please be advised, all patients with office appointments requiring lab work will take precedent over walk-in lab work.  If possible, please come for your lab work on Monday and Friday afternoons, as you may experience shorter wait times. The office is located at [address], [place], [place], [place] [address] No appointment is necessary.   Labs are drawn by [facility]. Please bring your co-pay at the time of your lab draw.  You may receive a bill from [facility] for your lab work.  If you wish to have your labs drawn at another location, please call the office 24 hours in advance to send orders.  If you have any questions regarding directions or hours of operation,  please call [phone].   As a reminder, please drink plenty of water prior to coming for your lab work. Thanks!   Vaccines You are taking a medication(s) that can suppress your immune system.  The following immunizations are recommended: Flu annually Covid-19  Td/Tdap (tetanus, diphtheria, pertussis) every 10 years Pneumonia (Prevnar 15 then Pneumovax 23 at least 1 year apart.  Alternatively, can take Prevnar 20 without needing additional dose) Shingrix:  2 doses from 4 weeks to 6 months apart  Please check with your PCP to make sure you are up to date.  COVID-19 vaccine recommendations:   COVID-19 vaccine is recommended for everyone (unless you are allergic to a vaccine component), even if you are on a medication that suppresses your immune system.    If you are on Orencia subcutaneous injection - hold medication one week prior to and one week after the first COVID-19 vaccine dose (only).   Do not take Tylenol or any anti-inflammatory medications (NSAIDs) 24 hours prior to the COVID-19 vaccination.   There is no direct evidence about the efficacy of the COVID-19 vaccine in individuals who are on medications that suppress the immune system.   Even if you are fully vaccinated, and you are on any medications that suppress your immune system, please continue to wear a mask, maintain at least six feet social distance and practice hand hygiene.   If you develop a COVID-19 infection, please contact your PCP or our office to determine if you need monoclonal antibody infusion.  The booster vaccine is now available for immunocompromised patients.   Please see the following web sites for updated information.   [email or web address]-Patient-[email or web address]  If you have signs or symptoms of an infection or start antibiotics: First, call your PCP for workup of your infection. Hold your medication through the infection, until you complete your antibiotics, and until symptoms resolve if you take the following: Injectable medication (Actemra, Benlysta, Cimzia, Cosentyx, Enbrel, Humira, Kevzara, Orencia, Remicade, Simponi, [place], Glas[place],  Tremfya) Methotrexate Leflunomide (Arava) Mycophenolate (Cellcept) Morrie Sheldon, Olumiant, or Rinvoq

## 2020-10-26 NOTE — Progress Notes (Signed)
Plan was to start patient on pilocarpine eye drops but patient reports that it is too costly. Tablets were discussed today but reviewed the potential (Grade C) interaction between pilocarpine and metoprolol. There is risk for cardiac conduction abnormalities. We reviewed that the risk is minimal with ophthalmologic formulation but with tablets there is greater risk (though not contraindicated) since tablets are systemically absorbed.  We reviewed that we could start pilocarpine at lower dose and do twice daily instead three times daily.  She has appt with Dr. Bettina Gavia, cardiologist, on 11/09/20 and will review with him at that visit.  Once she decides to move forward with it, she will reach back out to our cliinc  Knox Saliva, PharmD, MPH, BCPS Clinical Pharmacist (Rheumatology and Pulmonology)

## 2020-10-27 NOTE — Progress Notes (Signed)
White cell count is low and is stable.  Rheumatoid factor is positive and lower titer, AST and ALT are normal

## 2020-10-28 LAB — CBC WITH DIFFERENTIAL/PLATELET
Absolute Monocytes: 249 cells/uL (ref 200–950)
Basophils Absolute: 9 cells/uL (ref 0–200)
Basophils Relative: 0.3 %
Eosinophils Absolute: 90 cells/uL (ref 15–500)
Eosinophils Relative: 3 %
HCT: 42 % (ref 35.0–45.0)
Hemoglobin: 13.7 g/dL (ref 11.7–15.5)
Lymphs Abs: 1779 cells/uL (ref 850–3900)
MCH: 28 pg (ref 27.0–33.0)
MCHC: 32.6 g/dL (ref 32.0–36.0)
MCV: 85.7 fL (ref 80.0–100.0)
MPV: 10.6 fL (ref 7.5–12.5)
Monocytes Relative: 8.3 %
Neutro Abs: 873 cells/uL — ABNORMAL LOW (ref 1500–7800)
Neutrophils Relative %: 29.1 %
Platelets: 230 10*3/uL (ref 140–400)
RBC: 4.9 10*6/uL (ref 3.80–5.10)
RDW: 12.8 % (ref 11.0–15.0)
Total Lymphocyte: 59.3 %
WBC: 3 10*3/uL — ABNORMAL LOW (ref 3.8–10.8)

## 2020-10-28 LAB — PROTEIN ELECTROPHORESIS, SERUM, WITH REFLEX
Albumin ELP: 4 g/dL (ref 3.8–4.8)
Alpha 1: 0.3 g/dL (ref 0.2–0.3)
Alpha 2: 0.7 g/dL (ref 0.5–0.9)
Beta 2: 0.5 g/dL (ref 0.2–0.5)
Beta Globulin: 0.5 g/dL (ref 0.4–0.6)
Gamma Globulin: 2.2 g/dL — ABNORMAL HIGH (ref 0.8–1.7)
Total Protein: 8.2 g/dL — ABNORMAL HIGH (ref 6.1–8.1)

## 2020-10-28 LAB — AST: AST: 20 U/L (ref 10–35)

## 2020-10-28 LAB — ALT: ALT: 19 U/L (ref 6–29)

## 2020-10-28 LAB — RHEUMATOID FACTOR: Rheumatoid fact SerPl-aCnc: 17 IU/mL — ABNORMAL HIGH (ref <14)

## 2020-11-01 ENCOUNTER — Encounter (HOSPITAL_BASED_OUTPATIENT_CLINIC_OR_DEPARTMENT_OTHER): Payer: Self-pay

## 2020-11-01 ENCOUNTER — Ambulatory Visit (HOSPITAL_BASED_OUTPATIENT_CLINIC_OR_DEPARTMENT_OTHER)
Admission: RE | Admit: 2020-11-01 | Discharge: 2020-11-01 | Disposition: A | Discharge status: Home / Self Care | Payer: 59 | Source: Ambulatory Visit | Attending: Family | Admitting: Family

## 2020-11-01 ENCOUNTER — Other Ambulatory Visit: Payer: Self-pay

## 2020-11-01 DIAGNOSIS — Z1231 Encounter for screening mammogram for malignant neoplasm of breast: Secondary | ICD-10-CM | POA: Insufficient documentation

## 2020-11-01 DIAGNOSIS — Z0000 Encounter for general adult medical examination without abnormal findings: Secondary | ICD-10-CM

## 2020-11-04 ENCOUNTER — Encounter: Payer: Self-pay | Admitting: Family

## 2020-11-04 DIAGNOSIS — E669 Obesity, unspecified: Secondary | ICD-10-CM

## 2020-11-08 ENCOUNTER — Encounter: Payer: Self-pay | Admitting: *Deleted

## 2020-11-08 NOTE — Progress Notes (Signed)
Cardiology Office Note:    Date:  11/09/2020   ID:  Amber [name], DOB August 15, 1960, MRN [id number]  PCP:  [name], NP  Cardiologist:  Shirlee More, MD   Referring MD: [name], NP  ASSESSMENT:    1. Mild CAD   2. Hypertensive heart disease, unspecified whether heart failure present   3. Hyperlipidemia, unspecified hyperlipidemia type   4. Elevated coronary artery calcium score   5. Palpitations   6. Atrial premature contractions    PLAN:    In order of problems listed above:  There are opportunities to improve the quality of her life, but the calcium score greater than 75th percentile obesity she is statin and continue beta-blocker calcium channel blocker.   BP at target continue current treatment I do agree that a loop diuretic is needed she may need to take it intermittently and she should start an SGLT2 inhibitor for cardiorenal protection Initiate statin Recheck 7-day ZIO monitor to these individuals are at risk for sustained atrial arrhythmia if that was the case she would benefit from antiarrhythmic drug  Next appointment 1 month   Medication Adjustments/Labs and Tests Ordered: Current medicines are reviewed at length with the patient today.  Concerns regarding medicines are outlined above.  Orders Placed This Encounter  Procedures   LONG TERM MONITOR (3-14 DAYS)   EKG 12-Lead    Meds ordered this encounter  Medications   rosuvastatin (CRESTOR) 10 MG tablet    Sig: Take 1 tablet (10 mg total) by mouth daily.    Dispense:  90 tablet    Refill:  3   dapagliflozin propanediol (FARXIGA) 10 MG TABS tablet    Sig: Take 1 tablet (10 mg total) by mouth daily before breakfast.    Dispense:  90 tablet    Refill:  3      Chief Complaint  Patient presents with   Shortness of Breath   Palpitations  Symptoms have recently worsened but is better since she started her loop diuretic  History of Present Illness:    Amber [name] is a  60 y.o. female who is being seen today for the evaluation of shortness of breath at the time of office visit 08/29/2020 at the request of [name], NP.  BNP level was mildly elevated 128 hemoglobin 12.3 potassium 3.9 creatinine 0.6 D-dimer was 0.49 in the office EKG independently reviewed showed sinus rhythm 63 bpm with left and right atrial enlargement.    She had an EKG performed 03/29/2020 showing ejection fraction 60 to 65% normal right ventricular size function and no significant valvular abnormality.  Diastolic function was not described however filling pressure did not appear elevated.  Chart review shows that she was seen by Dr. Meda Coffee in January 2019. 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 Previous evaluation by cardiology last heart failure note heart failure she was on a loop diuretic  She was placed on loop diuretic by her PCP she is better She is bothered by exertional shortness of breath and chest pressure associated with palpitation. She is not having edema orthopnea chest pain at rest but has not had any sustained rapid heart rhythm She is not on a statin withcalcium score greater and 85th  percentile we will initiate a high intensity statin. Past Medical History:  Diagnosis Date   Anemia    Arthritis    Asthma 05/03/2020   Blood transfusion without reported diagnosis    CAD (coronary artery disease) 04/24/2017   Cerebrovascular disease 10/13/2020   Chronic diastolic CHF (congestive heart failure) ([place]) 04/24/2017   Colon polyps    Controlled type 2 diabetes mellitus without complication, without long-term current use of insulin ([place]) 05/31/2017   Diabetic peripheral neuropathy ([place]) 01/05/2020   Ectopic  pregnancy    RIGHT salpingostomy   Elective abortion    Essential hypertension 09/04/2017   GERD (gastroesophageal reflux disease)    Glaucoma    both eyes   Headache    migraines   History of chicken pox    Hyperlipemia    Hypertension    Iron deficiency anemia 09/02/2013   Migraine 04/19/2014   Obesity (BMI 30-39.9) 10/17/2012   Palpitations 10/01/2012   Rheumatoid arthritis ([place]) 11/07/2011   Rheumatoid arthritis(714.0) 11/07/2011   SAB (spontaneous abortion)    SAB (spontaneous abortion) 11/07/2011   Shortness of breath    Sjogren's disease ([place])    Wheezing     Past Surgical History:  Procedure Laterality Date   ABDOMINAL SURGERY     bowel repair   COLONOSCOPY     DILATION AND CURETTAGE OF UTERUS     X 2   MENISCUS REPAIR Right    MENISCUS TEAR REPAIR   MYOMECTOMY N/A 07/27/2014   Procedure: ABDOMINAL MYOMECTOMY;  Surgeon: Waymon Amato, MD;  Location: [facility];  Service: Gynecology;  Laterality: N/A;   PELVIC LAPAROSCOPY  1994   IN 1999 LAPAROSCOPY WITH INCIDENTAL ENTEROTOMY REQUIRING LAPAROTOMY   UTERINE FIBROID SURGERY     July 2016    Current Medications: Current Meds  Medication Sig   albuterol (VENTOLIN HFA) 108 (90 Base) MCG/ACT inhaler Inhale 2 puffs into the lungs every 6 (six) hours as needed for wheezing or shortness of breath.   amLODipine (NORVASC) 5 MG tablet Take 1 tablet (5 mg total) by mouth daily.   aspirin EC 81 MG tablet Take 1 tablet (81 mg total) by mouth daily.   blood glucose meter kit and supplies KIT Dispense based on patient and insurance preference. Use up to four times daily as directed. (FOR ICD-9 250.00, 250.01).   Blood Pressure Monitoring (ADULT BLOOD PRESSURE CUFF LG) KIT Use as directed   calcium-vitamin D (OSCAL WITH D) 500-200 MG-UNIT TABS tablet Take by mouth.   Cyanocobalamin (VITAMIN B-12) 5000 MCG LOZG Take 1 tablet by mouth 2 (two) times daily.   dapagliflozin propanediol (FARXIGA) 10 MG TABS tablet Take 1 tablet (10 mg total) by  mouth daily before breakfast.   fluticasone (FLOVENT HFA) 110 MCG/ACT inhaler 2 puffs into the lungs every 12 hours as needed.   furosemide (LASIX) 20 MG tablet Take 1 tablet (20 mg total) by mouth daily.   hydrochlorothiazide (HYDRODIURIL) 25 MG tablet Take 1 tablet (25 mg total) by mouth daily.   Lifitegrast (XIIDRA) 5 % SOLN Place 1 drop into both eyes 2 times daily.   lisinopril (ZESTRIL) 40 MG tablet Take 1 tablet (40 mg total) by mouth daily.   metoprolol succinate (TOPROL-XL) 100 MG 24 hr tablet Take 1 tablet (100 mg total) by mouth daily. Take with or immediately following a meal.   ORENCIA CLICKJECT 185 MG/ML SOAJ Inject 149m subcutaneously every 10 days.   rosuvastatin (CRESTOR) 10 MG tablet Take 1 tablet (10 mg total)  by mouth daily.   SUMAtriptan (IMITREX) 50 MG tablet TAKE 1 TABLET BY MOUTH EVERY 2 HOURS AS NEEDED FOR  MIGRAINE  MAY  REPEAT  IN  2  HOURS  IF  HEADACHE  PERSISTS  OR  RECURS   TRAVATAN Z 0.004 % SOLN ophthalmic solution Place 1 drop into both eyes at bedtime.      Allergies:   Patient has no known allergies.   Social History   Socioeconomic History   Marital status: Single    Spouse name: Not on file   Number of children: 0   Years of education: college   Highest education level: Not on file  Occupational History   Occupation: LPN    Employer: [employer]  Tobacco Use   Smoking status: Never   Smokeless tobacco: Never  Vaping Use   Vaping Use: Never used  Substance and Sexual Activity   Alcohol use: No    Alcohol/week: 0.0 standard drinks   Drug use: No   Sexual activity: Yes    Birth control/protection: None    Comment: 1st intercourse- 60, partners- 40  Other Topics Concern   Not on file  Social History Narrative   Regular exercise:  No   Caffeine Use: 2 cups coffee daily   No children   "Happily divorced"   Reports that she is involved at her church   Works as an Therapist, 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  Had a sister up in [place] who passed away  02-17-15 who she was close to.   Born in [place]- she came to [place] as adult.  Age 42.    Left-handed.         Social Determinants of Health   Financial Resource Strain: Not on file  Food Insecurity: Not on file  Transportation Needs: Not on file  Physical Activity: Not on file  Stress: Not on file  Social Connections: Not on file     Family History: The patient's family history includes Cancer in her sister; Diabetes in her sister and sister; Esophageal cancer in her sister; Heart disease in her mother; Hypertension in her mother and sister; Obesity in her mother; Stomach cancer in her sister; Stroke in her mother. There is no history of Rectal cancer or Colon cancer.  ROS:   ROS Please see the history of present illness.     All other systems reviewed and are negative.  EKGs/Labs/Other Studies Reviewed:    The following studies were reviewed today:   EKG:  EKG is  ordered today.  The ekg ordered today is personally reviewed and demonstrates sinus rhythm normal EKG  Recent Labs: 08/29/2020: Pro B Natriuretic peptide (BNP) 128.0; TSH 1.05 10/14/2020: BUN 13; Creatinine, Ser 0.67; Potassium 4.4; Sodium 139 10/26/2020: ALT 19; Hemoglobin 13.7; Platelets 230  Recent Lipid Panel    Component Value Date/Time   CHOL 161 05/03/2020 0838   CHOL 158 09/04/2017 1124   TRIG 68.0 05/03/2020 0838   HDL 56.30 05/03/2020 0838   HDL 48 09/04/2017 1124   CHOLHDL 3 05/03/2020 0838   VLDL 13.6 05/03/2020 0838   LDLCALC 91 05/03/2020 0838   LDLCALC 73 09/21/2019 1137    Physical Exam:    VS:  BP 138/80 (BP Location: Right Arm, Patient Position: Sitting, Cuff Size: Normal)   Pulse 70   Ht _0  (1.549 m)   Wt 216 lb (98 kg)   LMP 08/09/2016   SpO2 98%   BMI 40.81 kg/m     Wt  Readings from Last 3 Encounters:  11/09/20 216 lb (98 kg)  10/26/20 219 lb 6.4 oz (99.5 kg)  10/13/20 217 lb 2 oz (98.5 kg)     GEN:  Well nourished, well developed in no acute distress HEENT: Normal NECK: No  JVD; No carotid bruits LYMPHATICS: No lymphadenopathy CARDIAC: RRR, no murmurs, rubs, gallops RESPIRATORY:  Clear to auscultation without rales, wheezing or rhonchi  ABDOMEN: Soft, non-tender, non-distended MUSCULOSKELETAL:  No edema; No deformity  SKIN: Warm and dry NEUROLOGIC:  Alert and oriented x 3 PSYCHIATRIC:  Normal affect     Signed, Shirlee More, MD  11/09/2020 3:36 PM    [facility]

## 2020-11-08 NOTE — Addendum Note (Signed)
Addended by: [name] on: 11/08/2020 08:44 AM   Modules accepted: Orders

## 2020-11-09 ENCOUNTER — Ambulatory Visit (INDEPENDENT_AMBULATORY_CARE_PROVIDER_SITE_OTHER): Payer: 59

## 2020-11-09 ENCOUNTER — Ambulatory Visit (INDEPENDENT_AMBULATORY_CARE_PROVIDER_SITE_OTHER): Payer: 59 | Admitting: Cardiology

## 2020-11-09 ENCOUNTER — Other Ambulatory Visit: Payer: Self-pay

## 2020-11-09 ENCOUNTER — Encounter: Payer: Self-pay | Admitting: Cardiology

## 2020-11-09 VITALS — BP 138/80 | HR 70 | Ht 61.0 in | Wt 216.0 lb

## 2020-11-09 DIAGNOSIS — I2510 Atherosclerotic heart disease of native coronary artery without angina pectoris: Secondary | ICD-10-CM

## 2020-11-09 DIAGNOSIS — R931 Abnormal findings on diagnostic imaging of heart and coronary circulation: Secondary | ICD-10-CM | POA: Diagnosis not present

## 2020-11-09 DIAGNOSIS — I119 Hypertensive heart disease without heart failure: Secondary | ICD-10-CM

## 2020-11-09 DIAGNOSIS — I10 Essential (primary) hypertension: Secondary | ICD-10-CM

## 2020-11-09 DIAGNOSIS — E785 Hyperlipidemia, unspecified: Secondary | ICD-10-CM

## 2020-11-09 DIAGNOSIS — R002 Palpitations: Secondary | ICD-10-CM | POA: Diagnosis not present

## 2020-11-09 DIAGNOSIS — I491 Atrial premature depolarization: Secondary | ICD-10-CM

## 2020-11-09 MED ORDER — ROSUVASTATIN CALCIUM 10 MG PO TABS: 10.0000 mg | ORAL_TABLET | Freq: Every day | ORAL | 3 refills | Status: DC

## 2020-11-09 MED ORDER — DAPAGLIFLOZIN PROPANEDIOL 10 MG PO TABS: 10.0000 mg | ORAL_TABLET | Freq: Every day | ORAL | 3 refills | Status: DC

## 2020-11-09 NOTE — Patient Instructions (Signed)
Medication Instructions:  Your physician has recommended you make the following change in your medication:  START: Rosuvastatin 10 mg take one tablet by mouth daily.  START: Farxiga 10 mg take one tablet by mouth daily.  *If you need a refill on your cardiac medications before your next appointment, please call your pharmacy*   Lab Work: None If you have labs (blood work) drawn today and your tests are completely normal, you will receive your results only by: [place] (if you have MyChart) OR A paper copy in the mail If you have any lab test that is abnormal or we need to change your treatment, we will call you to review the results.   Testing/Procedures: A zio monitor was ordered today. It will remain on for 7 days. You will then return monitor and event diary in provided box. It takes 1-2 weeks for report to be downloaded and returned to [place]. We will call you with the results. If monitor falls off or has orange flashing light, please call Zio for further instructions.     Follow-Up: At [facility], you and your health needs are our priority.  As part of our continuing mission to provide you with exceptional heart care, we have created designated Provider Care Teams.  These Care Teams include your primary Cardiologist (physician) and Advanced Practice Providers (APPs -  Physician Assistants and Nurse Practitioners) who all work together to provide you with the care you need, when you need it.  We recommend signing up for the patient portal called "MyChart".  Sign up information is provided on this After Visit Summary.  MyChart is used to connect with patients for Virtual Visits (Telemedicine).  Patients are able to view lab/test results, encounter notes, upcoming appointments, etc.  Non-urgent messages can be sent to your provider as well.   To learn more about what you can do with MyChart, go to [email or web address].    Your next appointment:   4 week(s)  The format for  your next appointment:   In Person  Provider:   Shirlee More, MD    Other Instructions

## 2020-11-28 ENCOUNTER — Telehealth: Payer: Self-pay | Admitting: Cardiology

## 2020-11-28 NOTE — Telephone Encounter (Signed)
Patient was returning a call to discuss monitor results. Please call back

## 2020-11-28 NOTE — Telephone Encounter (Signed)
Patient informed of results.  

## 2020-12-12 ENCOUNTER — Other Ambulatory Visit: Payer: Self-pay

## 2020-12-12 ENCOUNTER — Encounter: Payer: Self-pay | Admitting: Obstetrics & Gynecology

## 2020-12-12 ENCOUNTER — Ambulatory Visit (INDEPENDENT_AMBULATORY_CARE_PROVIDER_SITE_OTHER): Payer: 59 | Admitting: Obstetrics & Gynecology

## 2020-12-12 ENCOUNTER — Other Ambulatory Visit (HOSPITAL_COMMUNITY)
Admission: RE | Admit: 2020-12-12 | Discharge: 2020-12-12 | Disposition: A | Discharge status: Home / Self Care | Payer: 59 | Source: Ambulatory Visit | Attending: Obstetrics & Gynecology | Admitting: Obstetrics & Gynecology

## 2020-12-12 VITALS — BP 146/84 | HR 91 | Ht 61.0 in | Wt 208.0 lb

## 2020-12-12 DIAGNOSIS — B3731 Acute candidiasis of vulva and vagina: Secondary | ICD-10-CM | POA: Diagnosis not present

## 2020-12-12 DIAGNOSIS — R87810 Cervical high risk human papillomavirus (HPV) DNA test positive: Secondary | ICD-10-CM

## 2020-12-12 DIAGNOSIS — N898 Other specified noninflammatory disorders of vagina: Secondary | ICD-10-CM

## 2020-12-12 MED ORDER — TERCONAZOLE 0.4 % VA CREA: 1.0000 | TOPICAL_CREAM | Freq: Every day | VAGINAL | 0 refills | Status: DC

## 2020-12-12 MED ORDER — FLUCONAZOLE 150 MG PO TABS: 150.0000 mg | ORAL_TABLET | Freq: Once | ORAL | 1 refills | Status: AC

## 2020-12-12 NOTE — Progress Notes (Signed)
History:  60 y.o. G3P0020 here today for consult for abnormal PAP. Pt also reports vulvar itching that began in the last week. Pt reports a h/o DM that is controlled with diet. Pt has not had a recent Glc test. She is not presently sexually active. She is divorced. NO h/o prev abnormal PAP tests. Last sexually active Jan 10 2019.  The following portions of the patient's history were reviewed and updated as appropriate: allergies, current medications, past family history, past medical history, past social history, past surgical history and problem list.  Review of Systems:  Pertinent items are noted in HPI.    Objective:  Physical Exam Last menstrual period 08/09/2016. BP (!) 146/84 Comment: pt has not taken BP meds yet this morning  Pulse 91   Ht 5\' 1"  (1.549 m)   Wt 208 lb (94.3 kg)   LMP 08/09/2016   BMI 39.30 kg/m   CONSTITUTIONAL: Well-developed, well-nourished female in no acute distress.  HENT:  Normocephalic, atraumatic EYES: Conjunctivae and EOM are normal. No scleral icterus.  NECK: Normal range of motion SKIN: Skin is warm and dry. No rash noted. Not diaphoretic.No pallor. [place]: Alert and oriented to person, place, and time. Normal coordination.  Pelvic: the ext genitalia is sl beefy red with white hue. This is c/w yeast.  normal appearing vaginal mucosa and cervix.  Normal discharge.    Labs and Imaging 10/07/2020 Positive Abnormal   hrHPV  Adequacy Satisfactory for evaluation; transformation zone component ABSENT.   Diagnosis - Negative for intraepithelial lesion or malignancy (NILM)   Comment Normal Reference Range HPV - Negative   3/630/2019 Satisfactory for evaluation  endocervical/transformation zone component ABSENT.    Diagnosis NEGATIVE FOR INTRAEPITHELIAL LESIONS OR MALIGNANCY.   HPV NOT DETECTED   Comment: Normal Reference Range - NOT Detected  Material Submitted CervicoVaginal Pap [ThinPrep Imaged]   01/15/2014 Satisfactory for evaluation   endocervical/transformation zone component ABSENT.    Diagnosis NEGATIVE FOR INTRAEPITHELIAL LESIONS OR MALIGNANCY.   HPV NOT DETECTED   Comment: Normal Reference Range - NOT Detected  Material Submitted CervicoVaginal Pap [ThinPrep Imaged]     Assessment & Plan:  Diagnoses and all orders for this visit:  Vaginal itching -     Cervicovaginal ancillary only( [place]) -     terconazole (TERAZOL 7) 0.4 % vaginal cream; Place 1 applicator vaginally at bedtime. Use for seven days -     fluconazole (DIFLUCAN) 150 MG tablet; Take 1 tablet (150 mg total) by mouth once for 1 dose. Can take additional dose in three days if symptoms persist  Vulvovaginitis due to yeast -     terconazole (TERAZOL 7) 0.4 % vaginal cream; Place 1 applicator vaginally at bedtime. Use for seven days -     fluconazole (DIFLUCAN) 150 MG tablet; Take 1 tablet (150 mg total) by mouth once for 1 dose. 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 Harraway-Smith, M.D., Cherlynn June

## 2020-12-13 LAB — CERVICOVAGINAL ANCILLARY ONLY
Bacterial Vaginitis (gardnerella): NEGATIVE
Candida Glabrata: NEGATIVE
Candida Vaginitis: NEGATIVE
Comment: NEGATIVE
Comment: NEGATIVE
Comment: NEGATIVE

## 2020-12-15 NOTE — Progress Notes (Signed)
Cardiology Office Note:    Date:  12/16/2020   ID:  Amber [name], DOB 10-Sep-1960, MRN [id number]  PCP:  [name], NP  Cardiologist:  Shirlee More, MD    Referring MD: [name], NP    ASSESSMENT:    1. Palpitations   2. Mild CAD   3. Hypertensive heart disease, unspecified whether heart failure present   4. Hyperlipidemia, unspecified hyperlipidemia type    PLAN:    In order of problems listed above:  Stable no significant arrhythmia to mitigate symptoms switch to rate slowing calcium channel blocker encouraged her to buy a smart device like a Fitbit to track her heart rate. Stable having no angina continue medical therapy including aspirin rate slowing calcium channel blocker beta-blocker and lipid-lowering with a high intensity statin. Well-controlled no evidence of heart failure Stable continue high intensity statin Stable diabetes managed by her PCP   Next appointment: 1 year   Medication Adjustments/Labs and Tests Ordered: Current medicines are reviewed at length with the patient today.  Concerns regarding medicines are outlined above.  No orders of the defined types were placed in this encounter.  Meds ordered this encounter  Medications   diltiazem (CARDIZEM CD) 180 MG 24 hr capsule    Sig: Take 1 capsule (180 mg total) by mouth daily.    Dispense:  90 capsule    Refill:  3     Chief Complaint  Patient presents with   Follow-up   Tachycardia    .[facility]  History of Present Illness:    Amber [name] is a 60 y.o. 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  Other problems include hypertensive heart disease hyperlipidemia elevated calcium score and palpitation with atrial premature contractions.  She was  last seen 11/09/2020 and underwent a 7-day event monitor reported 11/25/2020  which is unremarkable and the symptomatic events were sinus rhythm sinus tachycardia and unassociated with arrhythmia. . Compliance with diet, lifestyle and medications: Yes  She is reassured by the results of her event monitor but still at times feels her pulse is rapid Home blood pressure generally runs less than 419 systolic over 37-90. She is taking amlodipine that at times can cause reflex tachycardia and to mitigate her symptoms we will switch to a rate slowing calcium channel blocker Cardizem as her blood pressure is well controlled Avoid over-the-counter proarrhythmic drugs I told her she might want to buy a Fitbit to track her heart rate and I will see her back in the office as needed in 1 year She is having no angina shortness of breath or edema Past Medical History:  Diagnosis Date   Anemia    Arthritis    Asthma 05/03/2020   Blood transfusion without reported diagnosis    CAD (coronary artery disease) 04/24/2017   Cerebrovascular disease 10/13/2020   Chronic diastolic CHF (congestive heart failure) ([place]) 04/24/2017   Colon polyps    Controlled type 2 diabetes mellitus without complication, without long-term current use of insulin ([place]) 05/31/2017   Diabetic peripheral neuropathy ([place]) 01/05/2020   Ectopic pregnancy    RIGHT salpingostomy   Elective abortion    Essential hypertension 09/04/2017   GERD (gastroesophageal reflux disease)    Glaucoma    both eyes   Headache    migraines   History of chicken pox    Hyperlipemia  Hypertension    Iron deficiency anemia 09/02/2013   Migraine 04/19/2014   Obesity (BMI 30-39.9) 10/17/2012   Palpitations 10/01/2012   Rheumatoid arthritis ([place]) 11/07/2011   Rheumatoid arthritis(714.0) 11/07/2011   SAB (spontaneous abortion)    SAB (spontaneous abortion) 11/07/2011   Shortness of breath    Sjogren's disease ([place])    Wheezing     Past Surgical History:  Procedure Laterality Date   ABDOMINAL SURGERY     bowel repair   COLONOSCOPY      DILATION AND CURETTAGE OF UTERUS     X 2   MENISCUS REPAIR Right    MENISCUS TEAR REPAIR   MYOMECTOMY N/A 07/27/2014   Procedure: ABDOMINAL MYOMECTOMY;  Surgeon: Waymon Amato, MD;  Location: [facility];  Service: Gynecology;  Laterality: N/A;   PELVIC LAPAROSCOPY  1994   IN 1999 LAPAROSCOPY WITH INCIDENTAL ENTEROTOMY REQUIRING LAPAROTOMY   UTERINE FIBROID SURGERY     July 2016    Current Medications: Current Meds  Medication Sig   albuterol (VENTOLIN HFA) 108 (90 Base) MCG/ACT inhaler Inhale 2 puffs into the lungs every 6 (six) hours as needed for wheezing or shortness of breath.   aspirin EC 81 MG tablet Take 1 tablet (81 mg total) by mouth daily.   blood glucose meter kit and supplies KIT Dispense based on patient and insurance preference. Use up to four times daily as directed. (FOR ICD-9 250.00, 250.01).   Blood Pressure Monitoring (ADULT BLOOD PRESSURE CUFF LG) KIT Use as directed   calcium-vitamin D (OSCAL WITH D) 500-200 MG-UNIT TABS tablet Take by mouth.   Cyanocobalamin (VITAMIN B-12) 5000 MCG LOZG Take 1 tablet by mouth 2 (two) times daily.   dapagliflozin propanediol (FARXIGA) 10 MG TABS tablet Take 1 tablet (10 mg total) by mouth daily before breakfast.   diltiazem (CARDIZEM CD) 180 MG 24 hr capsule Take 1 capsule (180 mg total) by mouth daily.   fluticasone (FLOVENT HFA) 110 MCG/ACT inhaler 2 puffs into the lungs every 12 hours as needed.   furosemide (LASIX) 20 MG tablet Take 1 tablet (20 mg total) by mouth daily.   hydrochlorothiazide (HYDRODIURIL) 25 MG tablet Take 1 tablet (25 mg total) by mouth daily.   Lifitegrast (XIIDRA) 5 % SOLN Place 1 drop into both eyes 2 times daily.   lisinopril (ZESTRIL) 40 MG tablet Take 1 tablet (40 mg total) by mouth daily.   metoprolol succinate (TOPROL-XL) 100 MG 24 hr tablet Take 1 tablet (100 mg total) by mouth daily. Take with or immediately following a meal.   ORENCIA CLICKJECT 081 MG/ML SOAJ Inject 11m subcutaneously every 10 days.    rosuvastatin (CRESTOR) 10 MG tablet Take 1 tablet (10 mg total) by mouth daily.   SUMAtriptan (IMITREX) 50 MG tablet TAKE 1 TABLET BY MOUTH EVERY 2 HOURS AS NEEDED FOR  MIGRAINE  MAY  REPEAT  IN  2  HOURS  IF  HEADACHE  PERSISTS  OR  RECURS   topiramate (TOPAMAX) 25 MG tablet Take one tablet at night for one week, then take 2 tablets at night for one week, then take 3 tablets at night.   TRAVATAN Z 0.004 % SOLN ophthalmic solution Place 1 drop into both eyes at bedtime.    [DISCONTINUED] amLODipine (NORVASC) 5 MG tablet Take 1 tablet (5 mg total) by mouth daily.     Allergies:   Patient has no known allergies.   Social History   Socioeconomic History   Marital status: Single  Spouse name: Not on file   Number of children: 0   Years of education: college   Highest education level: Not on file  Occupational History   Occupation: LPN    Employer: [employer]  Tobacco Use   Smoking status: Never   Smokeless tobacco: Never  Vaping Use   Vaping Use: Never used  Substance and Sexual Activity   Alcohol use: No    Alcohol/week: 0.0 standard drinks   Drug use: No   Sexual activity: Yes    Birth control/protection: None    Comment: 1st intercourse- 89, partners- 91  Other Topics Concern   Not on file  Social History Narrative   Regular exercise:  No   Caffeine Use: 2 cups coffee daily   No children   "Happily divorced"   Reports that she is involved at her church   Works as an Therapist, 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  Had a sister up in [place] who passed away 03-18-15 who she was close to.   Born in [place]- she came to [place] as adult.  Age 71.    Left-handed.         Social Determinants of Health   Financial Resource Strain: Not on file  Food Insecurity: Not on file  Transportation Needs: Not on file  Physical Activity: Not on file  Stress: Not on file  Social Connections: Not on file     Family History: The patient's family history includes Cancer in her sister; Diabetes in her  sister and sister; Esophageal cancer in her sister; Heart disease in her mother; Hypertension in her mother and sister; Obesity in her mother; Stomach cancer in her sister; Stroke in her mother. There is no history of Rectal cancer or Colon cancer. ROS:   Please see the history of present illness.    All other systems reviewed and are negative.  EKGs/Labs/Other Studies Reviewed:    The following studies were reviewed today:    Recent Labs: 08/29/2020: Pro B Natriuretic peptide (BNP) 128.0; TSH 1.05 10/14/2020: BUN 13; Creatinine, Ser 0.67; Potassium 4.4; Sodium 139 10/26/2020: ALT 19; Hemoglobin 13.7; Platelets 230  Recent Lipid Panel    Component Value Date/Time   CHOL 161 05/03/2020 0838   CHOL 158 09/04/2017 1124   TRIG 68.0 05/03/2020 0838   HDL 56.30 05/03/2020 0838   HDL 48 09/04/2017 1124   CHOLHDL 3 05/03/2020 0838   VLDL 13.6 05/03/2020 0838   LDLCALC 91 05/03/2020 0838   LDLCALC 73 09/21/2019 1137    Physical Exam:    VS:  BP 124/70 (BP Location: Right Arm, Patient Position: Sitting, Cuff Size: Normal)    Pulse 80    Ht _0  (1.549 m)    Wt 209 lb 1.9 oz (94.9 kg)    LMP 08/09/2016    SpO2 99%    BMI 39.51 kg/m     Wt Readings from Last 3 Encounters:  12/16/20 209 lb 1.9 oz (94.9 kg)  12/12/20 208 lb (94.3 kg)  11/09/20 216 lb (98 kg)     GEN:  Well nourished, well developed in no acute distress HEENT: Normal NECK: No JVD; No carotid bruits LYMPHATICS: No lymphadenopathy CARDIAC: RRR, no murmurs, rubs, gallops RESPIRATORY:  Clear to auscultation without rales, wheezing or rhonchi  ABDOMEN: Soft, non-tender, non-distended MUSCULOSKELETAL:  No edema; No deformity  SKIN: Warm and dry NEUROLOGIC:  Alert and oriented x 3 PSYCHIATRIC:  Normal affect    Signed, Shirlee More, MD  12/16/2020 9:47 AM  [facility]

## 2020-12-16 ENCOUNTER — Encounter: Payer: Self-pay | Admitting: Cardiology

## 2020-12-16 ENCOUNTER — Ambulatory Visit (INDEPENDENT_AMBULATORY_CARE_PROVIDER_SITE_OTHER): Payer: 59 | Admitting: Cardiology

## 2020-12-16 ENCOUNTER — Other Ambulatory Visit: Payer: Self-pay

## 2020-12-16 VITALS — BP 124/70 | HR 80 | Ht 61.0 in | Wt 209.1 lb

## 2020-12-16 DIAGNOSIS — I2510 Atherosclerotic heart disease of native coronary artery without angina pectoris: Secondary | ICD-10-CM | POA: Diagnosis not present

## 2020-12-16 DIAGNOSIS — I119 Hypertensive heart disease without heart failure: Secondary | ICD-10-CM | POA: Diagnosis not present

## 2020-12-16 DIAGNOSIS — R002 Palpitations: Secondary | ICD-10-CM | POA: Diagnosis not present

## 2020-12-16 DIAGNOSIS — E785 Hyperlipidemia, unspecified: Secondary | ICD-10-CM

## 2020-12-16 MED ORDER — DILTIAZEM HCL ER COATED BEADS 180 MG PO CP24: 180.0000 mg | ORAL_CAPSULE | Freq: Every day | ORAL | 3 refills | Status: DC

## 2020-12-16 NOTE — Patient Instructions (Signed)
Medication Instructions:  Your physician has recommended you make the following change in your medication:  STOP: Amlodipine  START: Cardizem 180 mg take one tablet by mouth daily.  *If you need a refill on your cardiac medications before your next appointment, please call your pharmacy*   Lab Work: None If you have labs (blood work) drawn today and your tests are completely normal, you will receive your results only by: [place] (if you have MyChart) OR A paper copy in the mail If you have any lab test that is abnormal or we need to change your treatment, we will call you to review the results.   Testing/Procedures: None   Follow-Up: At [facility], you and your health needs are our priority.  As part of our continuing mission to provide you with exceptional heart care, we have created designated Provider Care Teams.  These Care Teams include your primary Cardiologist (physician) and Advanced Practice Providers (APPs -  Physician Assistants and Nurse Practitioners) who all work together to provide you with the care you need, when you need it.  We recommend signing up for the patient portal called "MyChart".  Sign up information is provided on this After Visit Summary.  MyChart is used to connect with patients for Virtual Visits (Telemedicine).  Patients are able to view lab/test results, encounter notes, upcoming appointments, etc.  Non-urgent messages can be sent to your provider as well.   To learn more about what you can do with MyChart, go to [email or web address].    Your next appointment:   As needed  The format for your next appointment:   In Person  Provider:   Shirlee More, MD    Other Instructions

## 2021-01-16 NOTE — Progress Notes (Signed)
Office Visit Note  Patient: Amber [name]             Date of Birth: May 20, 1960           MRN: [id number]             PCP: [name], NP Referring: [name], NP Visit Date: 01/30/2021 Occupation: @GUAROCC @  Subjective:  Pain in both knees   History of Present Illness: Amber [name] is a 61 y.o. female with history of seropositive rheumatoid arthritis and sjogren's syndrome.  She is on orencia 125 mg sq injections once every 10 days.  She has not missed any doses of Orencia recently.  She continues to tolerate Orencia without any side effects or injection site reactions.  She denies any signs or symptoms of a rheumatoid arthritis flare.  She has occasional discomfort in both knee joints and notices crepitus when walking.  She denies any swelling in her knees recently.  She has noticed some hyperextension in several of her fingers but denies any tenderness or inflammation.  She has some ongoing discomfort in the left shoulder and is currently under the care of physical therapy twice daily.  Her range of motion and discomfort in the left shoulder has been improving. She denies any recent infections.     Activities of Daily Living:  Patient reports morning stiffness for 0 minutes.   Patient Reports nocturnal pain.  Difficulty dressing/grooming: Denies Difficulty climbing stairs: Denies Difficulty getting out of chair: Reports Difficulty using hands for taps, buttons, cutlery, and/or writing: Denies  Review of Systems  Constitutional:  Negative for fatigue.  HENT:  Positive for mouth dryness. Negative for mouth sores and nose dryness.   Eyes:  Positive for pain, itching and dryness. Negative for visual disturbance.  Respiratory:  Negative for cough, hemoptysis, shortness of breath and difficulty breathing.   Cardiovascular:  Positive for palpitations. Negative for chest pain, hypertension and swelling in legs/feet.  Gastrointestinal:  Negative for blood  in stool, constipation and diarrhea.  Endocrine: Negative for increased urination.  Genitourinary:  Negative for difficulty urinating and painful urination.  Musculoskeletal:  Positive for joint pain and joint pain. Negative for joint swelling, myalgias, muscle weakness, morning stiffness, muscle tenderness and myalgias.  Skin:  Negative for color change, pallor, rash, hair loss, nodules/bumps, redness, skin tightness, ulcers and sensitivity to sunlight.  Allergic/Immunologic: Negative for susceptible to infections.  Neurological:  Positive for headaches. Negative for dizziness, numbness, memory loss and weakness.  Hematological:  Negative for bruising/bleeding tendency and swollen glands.  Psychiatric/Behavioral:  Negative for depressed mood, confusion and sleep disturbance. The patient is not nervous/anxious.    PMFS History:  Patient Active Problem List   Diagnosis Date Noted   High risk HPV infection 10/17/2020   Cerebrovascular disease 10/13/2020   Colon polyps 09/26/2020   GERD (gastroesophageal reflux disease) 09/26/2020   B12 deficiency 05/03/2020   Hyperlipidemia 05/03/2020   Asthma 05/03/2020   Diabetic peripheral neuropathy ([place]) 01/05/2020   Class 2 severe obesity with serious comorbidity and body mass index (BMI) of 38.0 to 38.9 in adult, unspecified obesity type ([place]) 01/05/2020   Adrenal mass, right ([place]) 01/05/2020   Hypertensive heart disease 09/04/2017   Controlled type 2 diabetes mellitus without complication, without long-term current use of insulin ([place]) 05/31/2017   Chronic diastolic CHF (congestive heart failure) ([place]) 04/24/2017   CAD (coronary artery disease) 04/24/2017   History of BCG vaccination 06/14/2016   Fibroids 07/27/2014   Migraine 04/19/2014  Nail fungus 04/09/2014   Elevated serum protein level 04/09/2014   Preventative health care 04/09/2014   Iron deficiency anemia 09/02/2013   Headache(784.0) 03/04/2013   Obesity (BMI 30-39.9) 10/17/2012    Palpitations 10/01/2012   Glaucoma 11/07/2011   Rheumatoid arthritis ([place]) 11/07/2011   Primary hypertension 11/05/2011   Sjogren's disease ([place]) 11/05/2011   Anemia 09/22/2010   Fibroid uterus 09/22/2010   Neutropenia ([place]) 09/22/2010    Past Medical History:  Diagnosis Date   Anemia    Arthritis    Asthma 05/03/2020   Blood transfusion without reported diagnosis    CAD (coronary artery disease) 04/24/2017   Cerebrovascular disease 10/13/2020   Chronic diastolic CHF (congestive heart failure) ([place]) 04/24/2017   Colon polyps    Controlled type 2 diabetes mellitus without complication, without long-term current use of insulin ([place]) 05/31/2017   Diabetic peripheral neuropathy ([place]) 01/05/2020   Ectopic pregnancy    RIGHT salpingostomy   Elective abortion    Elective abortion 09/26/2020   RIGHT salpingostomy   Essential hypertension 09/04/2017   GERD (gastroesophageal reflux disease)    Glaucoma    both eyes   Headache    migraines   History of chicken pox    History of chicken pox 09/26/2020   migraines   Hyperlipemia    Hypertension    Iron deficiency anemia 09/02/2013   Migraine 04/19/2014   Obesity (BMI 30-39.9) 10/17/2012   Palpitations 10/01/2012   Rheumatoid arthritis ([place]) 11/07/2011   Rheumatoid arthritis(714.0) 11/07/2011   SAB (spontaneous abortion)    SAB (spontaneous abortion) 11/07/2011   Shortness of breath    Sjogren's disease ([place])    Wheezing     Family History  Problem Relation Age of Onset   Hypertension Mother        died from heart disease   Heart disease Mother        deceased, unknown cause   Stroke Mother    Obesity Mother    Diabetes Sister    Hypertension Sister    Esophageal cancer Sister    Stomach cancer Sister    Cancer Sister    Diabetes Sister    Rectal cancer Neg Hx    Colon cancer Neg Hx    Past Surgical History:  Procedure Laterality Date   ABDOMINAL SURGERY     bowel repair   COLONOSCOPY     DILATION AND CURETTAGE OF UTERUS     X  2   MENISCUS REPAIR Right    MENISCUS TEAR REPAIR   MYOMECTOMY N/A 07/27/2014   Procedure: ABDOMINAL MYOMECTOMY;  Surgeon: Waymon Amato, MD;  Location: [facility];  Service: Gynecology;  Laterality: N/A;   PELVIC LAPAROSCOPY  1994   IN 1999 LAPAROSCOPY WITH INCIDENTAL ENTEROTOMY REQUIRING LAPAROTOMY   UTERINE FIBROID SURGERY     July 2016   Social History   Social History Narrative   Regular exercise:  No   Caffeine Use: 2 cups coffee daily   No children   "Happily divorced"   Reports that she is involved at her church   Works as an Therapist, 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  Had a sister up in [place] who passed away 04-16-2015 who she was close to.   Born in [place]- she came to [place] as adult.  Age 75.    Left-handed.         Immunization History  Administered Date(s) Administered   Hepatitis A, Adult 11/01/2017, 12/03/2018   IPV 11/01/2017   Influenza Split 11/07/2011  Influenza,inj,Quad PF,6+ Mos 10/17/2012, 01/15/2014, 08/31/2014, 09/30/2017, 09/10/2018, 09/21/2019, 10/07/2020   Meningococcal Mcv4o 11/01/2017   Moderna Sars-Covid-2 Vaccination 01/28/2019, 02/27/2019, 11/27/2019   Pneumococcal Conjugate-13 08/05/2020   Pneumococcal Polysaccharide-23 09/10/2018   Tdap 06/01/2011, 11/25/2012   Typhoid Live 11/01/2017   Zoster Recombinat (Shingrix) 10/04/2017, 12/04/2017     Objective: Vital Signs: BP (!) 168/96 (BP Location: Left Arm, Patient Position: Sitting, Cuff Size: Large)    Pulse 79    Ht 5\' 1"  (1.549 m)    Wt 213 lb 9.6 oz (96.9 kg)    LMP 08/09/2016    BMI 40.36 kg/m    Physical Exam Vitals and nursing note reviewed.  Constitutional:      Appearance: She is well-developed.  HENT:     Head: Normocephalic and atraumatic.  Eyes:     Conjunctiva/sclera: Conjunctivae normal.  Pulmonary:     Effort: Pulmonary effort is normal.  Abdominal:     Palpations: Abdomen is soft.  Musculoskeletal:     Cervical back: Normal range of motion.  Skin:    General: Skin is warm and dry.      Capillary Refill: Capillary refill takes less than 2 seconds.  Neurological:     Mental Status: She is alert and oriented to person, place, and time.  Psychiatric:        Behavior: Behavior normal.     Musculoskeletal Exam: C-spine is slightly limited range of motion especially to the left.  Slightly limited ROM the left shoulder with abduction and internal rotation.  Right shoulder has full range of motion with no discomfort.  Elbow joints, wrist joints, MCPs, PIPs, DIPs have good range of motion with no synovitis.  Complete fist formation bilaterally.  Hip joints have slightly limited range of motion but no groin pain.  Knee joints have crepitus with range of motion bilaterally.  No warmth or effusion of knee joints noted.  Ankle joints have good range of motion with no tenderness or joint swelling.  No tenderness over MTP joints.  CDAI Exam: CDAI Score: 0.4  Patient Global: 2 mm; Provider Global: 2 mm Swollen: 0 ; Tender: 0  Joint Exam 01/30/2021   No joint exam has been documented for this visit   There is currently no information documented on the homunculus. Go to the Rheumatology activity and complete the homunculus joint exam.  Investigation: No additional findings.  Imaging: No results found.  Recent Labs: Lab Results  Component Value Date   WBC 2.8 (L) 01/26/2021   HGB 13.6 01/26/2021   PLT 230 01/26/2021   NA 143 01/26/2021   K 4.2 01/26/2021   CL 107 01/26/2021   CO2 32 01/26/2021   GLUCOSE 114 (H) 01/26/2021   BUN 13 01/26/2021   CREATININE 0.76 01/26/2021   BILITOT 0.8 01/26/2021   ALKPHOS 62 09/10/2018   AST 17 01/26/2021   ALT 18 01/26/2021   PROT 7.5 01/26/2021   ALBUMIN 4.0 09/10/2018   CALCIUM 9.7 01/26/2021   GFRAA 115 02/19/2020   QFTBGOLD Negative 06/14/2016   QFTBGOLDPLUS NEGATIVE 01/20/2020    Speciality Comments: PLQ eye exam: 12/09/2018 normal. [facility].  Procedures:  No procedures performed Allergies: Patient has no known  allergies.   Assessment / Plan:     Visit Diagnoses: Rheumatoid arthritis involving multiple sites with positive rheumatoid factor (HCC) - Positive RF, negative anti-CCP, 14 3 3  eta positive, hx of rheumatoid arthritis for the last 10 years. previously under the care of Dr. Graylon Gunning & [name]: She  has no synovitis on examination today.  She is clinically been doing well on Orencia 125 mg subcutaneous injections every 10 days.  She has not missed any doses of Orencia recently continues to tolerate it without any side effects or injection site reactions.  She experiences intermittent pain in both knee joints and has ongoing crepitus.  On examination today no warmth or effusion of her knee joints were noted.  Overall she has found Orencia to be effective at managing her rheumatoid arthritis.  X-rays of both hands and feet were updated today to assess for radiographic progression.  She will remain on Orencia as monotherapy.  She will continue to space the dose of Orencia to every 10 days due to history of neutropenia.  We will continue to follow lab work closely.  She will return in 1 month to recheck CBC with differential.  She was advised to notify us if she develops increased joint pain or joint swelling.  She will follow-up in the office in 3 months. - Plan: XR Hand 2 View Right, XR Hand 2 View Left, XR Foot 2 Views Right, XR Foot 2 Views Left  High risk medication use - Orencia 125 mg subcutaneous every 10 days. Initially started on Orencia 01/28/20. (methotrexate was discontinued due to neutropenia).  CBC and CMP updated on 01/26/21.  She will return in 1 month to repeat CBC with diff.  She has long standing history of neutropenia.  We will continue to monitor lab work closely.  Standing orders for CBC and CMP remain in place.  TB gold negative on 01/26/21.  She has not had any recent infections.  Discussed the importance of holding Orencia if she develops signs or symptoms of an infection and to resume  once the infection is completely cleared.  Sjogren's syndrome with other organ involvement ([place]) - Anti-Ro positive, anti-La positive: She has ongoing sicca symptoms which are unchanged. Future orders including ANA, Ro antibody, La antibody, SPEP, RF, complements, and UA were placed today.    Primary osteoarthritis of both knees: She has good ROM of both knee joints with crepitus.  No warmth or effusion of knee joints.  She experiences intermittent discomfort in both knee joints especially when walking for prolonged periods of time.  Discussed the importance of lower extremity muscle strengthening.  She was given a handout of knee joint exercises to perform.  Primary osteoarthritis of both feet: She is not experiencing any discomfort in her feet at this time.  She has good range of motion of both ankle joints with no tenderness or synovitis.  No tenderness over MTP joints.  Plantar fasciitis of right foot - She was evaluated by Dr. Paulla Dolly on 03/07/2020 and was diagnosed with plantar fasciitis of the right foot.  Improved.  Neck pain - She has slightly limited range of motion with lateral rotation.  No symptoms of radiculopathy at this time.  She is currently going to physical therapy twice a week for neck and left shoulder joint discomfort.  She takes Flexeril 5 mg 1 tablet by mouth at bedtime as needed for muscle spasms and pain relief.   Other medical conditions are listed as follows:   Family history of rheumatoid arthritis - Sister.   Essential hypertension: Recently started on valsartan.  She will be following up with her PCP next week to further discuss blood pressure monitoring.  Controlled type 2 diabetes mellitus without complication, without long-term current use of insulin (HCC)  Coronary artery disease involving native coronary artery  of native heart without angina pectoris  Other fatigue  Palpitations  Chronic diastolic CHF (congestive heart failure) (HCC)  Hx of  migraines  Orders: Orders Placed This Encounter  Procedures   XR Hand 2 View Right   XR Hand 2 View Left   XR Foot 2 Views Right   XR Foot 2 Views Left   No orders of the defined types were placed in this encounter.    Follow-Up Instructions: Return in about 3 months (around 04/30/2021) for Rheumatoid arthritis, Sjogren's syndrome.   Ofilia Neas, PA-C  Note - This record has been created using Dragon software.  Chart creation errors have been sought, but may not always  have been located. Such creation errors do not reflect on  the standard of medical care.

## 2021-01-23 ENCOUNTER — Encounter: Payer: Self-pay | Admitting: Family

## 2021-01-23 ENCOUNTER — Telehealth: Payer: Self-pay | Admitting: Family

## 2021-01-23 ENCOUNTER — Ambulatory Visit: Payer: 59 | Admitting: Family

## 2021-01-23 VITALS — BP 167/74 | HR 69 | Temp 98.7°F | Resp 16 | Ht 61.0 in | Wt 209.0 lb

## 2021-01-23 DIAGNOSIS — M0579 Rheumatoid arthritis with rheumatoid factor of multiple sites without organ or systems involvement: Secondary | ICD-10-CM | POA: Diagnosis not present

## 2021-01-23 DIAGNOSIS — I10 Essential (primary) hypertension: Secondary | ICD-10-CM

## 2021-01-23 DIAGNOSIS — E119 Type 2 diabetes mellitus without complications: Secondary | ICD-10-CM | POA: Diagnosis not present

## 2021-01-23 DIAGNOSIS — E278 Other specified disorders of adrenal gland: Secondary | ICD-10-CM

## 2021-01-23 DIAGNOSIS — J45909 Unspecified asthma, uncomplicated: Secondary | ICD-10-CM

## 2021-01-23 LAB — BASIC METABOLIC PANEL
BUN: 12 mg/dL (ref 6–23)
CO2: 28 mEq/L (ref 19–32)
Calcium: 9.5 mg/dL (ref 8.4–10.5)
Chloride: 105 mEq/L (ref 96–112)
Creatinine, Ser: 0.72 mg/dL (ref 0.40–1.20)
GFR: 91.42 mL/min (ref >60.00)
Glucose, Bld: 104 mg/dL — ABNORMAL HIGH (ref 70–99)
Potassium: 3.8 mEq/L (ref 3.5–5.1)
Sodium: 141 mEq/L (ref 135–145)

## 2021-01-23 LAB — HEMOGLOBIN A1C: Hgb A1c MFr Bld: 6.2 % (ref 4.6–6.5)

## 2021-01-23 MED ORDER — VALSARTAN 160 MG PO TABS: 160.0000 mg | ORAL_TABLET | Freq: Every day | ORAL | 0 refills | Status: DC

## 2021-01-23 NOTE — Assessment & Plan Note (Signed)
Clinically improved on Orencia- management per rheumatology.

## 2021-01-23 NOTE — Progress Notes (Signed)
Subjective:     Patient ID: Amber [name], female    DOB: 1960-10-28, 61 y.o.   MRN: [id number]  Chief Complaint  Patient presents with   Diabetes    Here for follow up   Hypertension    Here for follow up   Migraine    Still getting this at least once a wee, "ok with medication"    Diabetes  Hypertension  Migraine  Her past medical history is significant for hypertension.    DM2-  Lab Results  Component Value Date   HGBA1C 6.3 08/05/2020   HGBA1C 6.5 05/03/2020   HGBA1C 6.2 01/05/2020   Lab Results  Component Value Date   MICROALBUR 3.20 (H) 11/07/2011   LDLCALC 91 05/03/2020   CREATININE 0.67 10/14/2020   HTN- maintained on cardizem, hctz, lisinopril, and toprol xl 13m.   She has not been checking recently.    BP Readings from Last 3 Encounters:  01/23/21 (!) 167/74  12/16/20 124/70  12/12/20 (!) 146/84   Migraine- Notes a few headaches a week.   Hyperlipidemia-  maintained on crestor 165m  Lab Results  Component Value Date   CHOL 161 05/03/2020   HDL 56.30 05/03/2020   LDLCALC 91 05/03/2020   TRIG 68.0 05/03/2020   CHOLHDL 3 05/03/2020   Asthma-  reports stable. Not needing flovent.    RA- maintained on orencia per rheumatology. Reports improvement in her RA pain.   Health Maintenance Due  Topic Date Due   COVID-19 Vaccine (4 - Booster for Moderna series) 01/22/2020   OPHTHALMOLOGY EXAM  12/02/2020    Past Medical History:  Diagnosis Date   Anemia    Arthritis    Asthma 05/03/2020   Blood transfusion without reported diagnosis    CAD (coronary artery disease) 04/24/2017   Cerebrovascular disease 10/13/2020   Chronic diastolic CHF (congestive heart failure) ([facility]/24/2019   Colon polyps    Controlled type 2 diabetes mellitus without complication, without long-term current use of insulin ([facility]/31/2019   Diabetic peripheral neuropathy ([facility]/04/2020   Ectopic pregnancy    RIGHT salpingostomy   Elective abortion    Elective  abortion 09/26/2020   RIGHT salpingostomy   Essential hypertension 09/04/2017   GERD (gastroesophageal reflux disease)    Glaucoma    both eyes   Headache    migraines   History of chicken pox    History of chicken pox 09/26/2020   migraines   Hyperlipemia    Hypertension    Iron deficiency anemia 09/02/2013   Migraine 04/19/2014   Obesity (BMI 30-39.9) 10/17/2012   Palpitations 10/01/2012   Rheumatoid arthritis ([facility]/06/2011   Rheumatoid arthritis(714.0) 11/07/2011   SAB (spontaneous abortion)    SAB (spontaneous abortion) 11/07/2011   Shortness of breath    S[place]ogren's disease ([facility]   Wheezing     Past Surgical History:  Procedure Laterality Date   ABDOMINAL SURGERY     bowel repair   COLONOSCOPY     DILATION AND CURETTAGE OF UTERUS     X 2   MENISCUS REPAIR Right    MENISCUS TEAR REPAIR   MYOMECTOMY N/A 07/27/2014   Procedure: ABDOMINAL MYOMECTOMY;  Surgeon: EmWaymon AmatoMD;  [facility];  Service: Gynecology;  Laterality: N/A;   PELVIC LAPAROSCOPY  1994   IN 1999 LAPAROSCOPY WITH INCIDENTAL ENTEROTOMY REQUIRING LAPAROTOMY   UTERINE FIBROID SURGERY     July 2016    Family History  Problem Relation Age of Onset  Hypertension Mother        died from heart disease   Heart disease Mother        deceased, unknown cause   Stroke Mother    Obesity Mother    Diabetes Sister    Hypertension Sister    Esophageal cancer Sister    Stomach cancer Sister    Cancer Sister    Diabetes Sister    Rectal cancer Neg Hx    Colon cancer Neg Hx     Social History   Socioeconomic History   Marital status: Single    Spouse name: Not on file   Number of children: 0   Years of education: college   Highest education level: Not on file  Occupational History   Occupation: LPN    Employer: [employer]  Tobacco Use   Smoking status: Never   Smokeless tobacco: Never  Vaping Use   Vaping Use: Never used  Substance and Sexual Activity   Alcohol use: No    Alcohol/week: 0.0  standard drinks   Drug use: No   Sexual activity: Yes    Birth control/protection: None    Comment: 1st intercourse- 71, partners- 74  Other Topics Concern   Not on file  Social History Narrative   Regular exercise:  No   Caffeine Use: 2 cups coffee daily   No children   "Happily divorced"   Reports that she is involved at her church   Works as an Therapist, 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  Had a sister up in [place] who passed away March 05, 2015 who she was close to.   Born in [place]- she came to [place] as adult.  Age 85.    Left-handed.         Social Determinants of Health   Financial Resource Strain: Not on file  Food Insecurity: Not on file  Transportation Needs: Not on file  Physical Activity: Not on file  Stress: Not on file  Social Connections: Not on file  Intimate Partner Violence: Not on file    Outpatient Medications Prior to Visit  Medication Sig Dispense Refill   albuterol (VENTOLIN HFA) 108 (90 Base) MCG/ACT inhaler Inhale 2 puffs into the lungs every 6 (six) hours as needed for wheezing or shortness of breath. 18 g 5   aspirin EC 81 MG tablet Take 1 tablet (81 mg total) by mouth daily. 90 tablet 3   blood glucose meter kit and supplies KIT Dispense based on patient and insurance preference. Use up to four times daily as directed. (FOR ICD-9 250.00, 250.01). 1 each 0   Blood Pressure Monitoring (ADULT BLOOD PRESSURE CUFF LG) KIT Use as directed 1 kit 0   calcium-vitamin D (OSCAL WITH D) 500-200 MG-UNIT TABS tablet Take by mouth.     Cyanocobalamin (VITAMIN B-12) 5000 MCG LOZG Take 1 tablet by mouth 2 (two) times daily.     dapagliflozin propanediol (FARXIGA) 10 MG TABS tablet Take 1 tablet (10 mg total) by mouth daily before breakfast. 90 tablet 3   diltiazem (CARDIZEM CD) 180 MG 24 hr capsule Take 1 capsule (180 mg total) by mouth daily. 90 capsule 3   fluticasone (FLOVENT HFA) 110 MCG/ACT inhaler 2 puffs into the lungs every 12 hours as needed. 1 each 1   furosemide (LASIX) 20 MG  tablet Take 1 tablet (20 mg total) by mouth daily. 30 tablet 3   hydrochlorothiazide (HYDRODIURIL) 25 MG tablet Take 1 tablet (25 mg total) by mouth daily. 90 tablet 1  metoprolol succinate (TOPROL-XL) 100 MG 24 hr tablet Take 1 tablet (100 mg total) by mouth daily. Take with or immediately following a meal. 90 tablet 1   ORENCIA CLICKJECT 595 MG/ML SOAJ Inject 135m subcutaneously every 10 days. 4 mL 0   rosuvastatin (CRESTOR) 10 MG tablet Take 1 tablet (10 mg total) by mouth daily. 90 tablet 3   SUMAtriptan (IMITREX) 50 MG tablet TAKE 1 TABLET BY MOUTH EVERY 2 HOURS AS NEEDED FOR  MIGRAINE  MAY  REPEAT  IN  2  HOURS  IF  HEADACHE  PERSISTS  OR  RECURS 9 tablet 5   topiramate (TOPAMAX) 25 MG tablet Take one tablet at night for one week, then take 2 tablets at night for one week, then take 3 tablets at night. 90 tablet 3   TRAVATAN Z 0.004 % SOLN ophthalmic solution Place 1 drop into both eyes at bedtime.      lisinopril (ZESTRIL) 40 MG tablet Take 1 tablet (40 mg total) by mouth daily. 90 tablet 1   Lifitegrast (XIIDRA) 5 % SOLN Place 1 drop into both eyes 2 times daily.     terconazole (TERAZOL 7) 0.4 % vaginal cream Place 1 applicator vaginally at bedtime. Use for seven days (Patient not taking: Reported on 12/16/2020) 45 g 0   No facility-administered medications prior to visit.    No Known Allergies  ROS See hpi    Objective:    Physical Exam Constitutional:      Appearance: She is well-developed.  Cardiovascular:     Rate and Rhythm: Normal rate and regular rhythm.     Heart sounds: Normal heart sounds. No murmur heard. Pulmonary:     Effort: Pulmonary effort is normal. No respiratory distress.     Breath sounds: Normal breath sounds. No wheezing.  Psychiatric:        Behavior: Behavior normal.        Thought Content: Thought content normal.        Judgment: Judgment normal.   Diabetic Foot Exam - Simple   Simple Foot Form Diabetic Foot exam was performed with the  following findings: Yes 01/23/2021 10:59 AM  Visual Inspection No deformities, no ulcerations, no other skin breakdown bilaterally: Yes Sensation Testing Intact to touch and monofilament testing bilaterally: Yes Pulse Check Posterior Tibialis and Dorsalis pulse intact bilaterally: Yes Comments      BP (!) 167/74 (BP Location: Right Arm, Patient Position: Sitting, Cuff Size: Large)    Pulse 69    Temp 98.7 F (37.1 C) (Oral)    Resp 16    Ht 5' 1"  (1.549 m)    Wt 209 lb (94.8 kg)    LMP 08/09/2016    SpO2 100%    BMI 39.49 kg/m  Wt Readings from Last 3 Encounters:  01/23/21 209 lb (94.8 kg)  12/16/20 209 lb 1.9 oz (94.9 kg)  12/12/20 208 lb (94.3 kg)       Assessment & Plan:   Problem List Items Addressed This Visit       Unprioritized   Rheumatoid arthritis ([place]    Clinically improved on Orencia- management per rheumatology.       Primary hypertension    BP is uncontrolled. Will c/c lisinopril and change to valsartan 1666m  Continue hctz, metoprolol, diltiazem. I suspect that this is the cause for her headaches.  Hopefully HA's will improve with her improved bp control.       Relevant Medications   valsartan (DIOVAN) 160 MG  tablet   Controlled type 2 diabetes mellitus without complication, without long-term current use of insulin ([place]) - Primary    Not checking sugars at home.  Will check follow up A1C.       Relevant Medications   valsartan (DIOVAN) 160 MG tablet   Other Relevant Orders   Ambulatory referral to Ophthalmology   Hemoglobin O9G   Basic metabolic panel   Asthma    Stable, not currently needing flovent.       Adrenal mass, right ([place])    I have discontinued Giannie D. Spiller's Xiidra, lisinopril, and terconazole. I am also having her start on valsartan. Additionally, I am having her maintain her Travatan Z, Vitamin B-12, calcium-vitamin D, albuterol, aspirin EC, Adult Blood Pressure Cuff Lg, blood glucose meter kit and supplies, metoprolol  succinate, hydrochlorothiazide, SUMAtriptan, furosemide, fluticasone, topiramate, Orencia ClickJect, rosuvastatin, dapagliflozin propanediol, and diltiazem.  Meds ordered this encounter  Medications   valsartan (DIOVAN) 160 MG tablet    Sig: Take 1 tablet (160 mg total) by mouth daily.    Dispense:  30 tablet    Refill:  0    Order Specific Question:   Supervising Provider    Answer:   Penni Homans A [[id number]]

## 2021-01-23 NOTE — Assessment & Plan Note (Signed)
Not checking sugars at home.  Will check follow up A1C.

## 2021-01-23 NOTE — Assessment & Plan Note (Signed)
Stable, not currently needing flovent.

## 2021-01-23 NOTE — Assessment & Plan Note (Addendum)
BP is uncontrolled. Will c/c lisinopril and change to valsartan 160mg .  Continue hctz, metoprolol, diltiazem. I suspect that this is the cause for her headaches.  Hopefully HA's will improve with her improved bp control.

## 2021-01-23 NOTE — Patient Instructions (Addendum)
Stop lisinopril, start valsartan.  Complete lab work prior to leaving.

## 2021-01-24 ENCOUNTER — Encounter: Payer: Self-pay | Admitting: Family

## 2021-01-24 ENCOUNTER — Other Ambulatory Visit: Payer: Self-pay | Admitting: Rheumatology

## 2021-01-24 DIAGNOSIS — M0579 Rheumatoid arthritis with rheumatoid factor of multiple sites without organ or systems involvement: Secondary | ICD-10-CM

## 2021-01-24 NOTE — Telephone Encounter (Signed)
Next Visit: 01/30/2021  Last Visit: 10/26/2020  Last Fill: 10/21/2020  OF:[id number] arthritis involving multiple sites with positive rheumatoid factor   Current Dose per office note 10/26/2020: Orencia 125 mg subcutaneous every 10 days  Labs: 10/26/2020 White cell count is low and is stable.  Rheumatoid factor is positive and lower titer, AST and ALT are normal 10/14/2020 BMP- Glucose 106  TB Gold: 01/20/2020 Neg    Patient advised she is due to update her labs. Patient states she will update them on Thursday.   Okay to refill Orencia?

## 2021-01-24 NOTE — Telephone Encounter (Signed)
Opened in error

## 2021-01-26 ENCOUNTER — Other Ambulatory Visit: Payer: Self-pay | Admitting: *Deleted

## 2021-01-26 DIAGNOSIS — Z79899 Other long term (current) drug therapy: Secondary | ICD-10-CM

## 2021-01-27 ENCOUNTER — Ambulatory Visit: Payer: 59 | Admitting: Family

## 2021-01-27 NOTE — Progress Notes (Signed)
WBC count is low-2.8.  absolute neutrophils are low-652.  Reviewed with [name].  Repeat CBC with diff in 1 month.  Glucose is 114. Rest of CMP WNL  Ok to continue orencia 125 mg sq injections every 10 days.

## 2021-01-30 ENCOUNTER — Ambulatory Visit: Payer: 59 | Admitting: Physician Assistant

## 2021-01-30 ENCOUNTER — Ambulatory Visit: Payer: Self-pay

## 2021-01-30 ENCOUNTER — Other Ambulatory Visit: Payer: Self-pay

## 2021-01-30 ENCOUNTER — Encounter: Payer: Self-pay | Admitting: Physician Assistant

## 2021-01-30 VITALS — BP 168/96 | HR 79 | Ht 61.0 in | Wt 213.6 lb

## 2021-01-30 DIAGNOSIS — M170 Bilateral primary osteoarthritis of knee: Secondary | ICD-10-CM | POA: Diagnosis not present

## 2021-01-30 DIAGNOSIS — I10 Essential (primary) hypertension: Secondary | ICD-10-CM

## 2021-01-30 DIAGNOSIS — M79642 Pain in left hand: Secondary | ICD-10-CM

## 2021-01-30 DIAGNOSIS — I2510 Atherosclerotic heart disease of native coronary artery without angina pectoris: Secondary | ICD-10-CM

## 2021-01-30 DIAGNOSIS — R002 Palpitations: Secondary | ICD-10-CM

## 2021-01-30 DIAGNOSIS — I5032 Chronic diastolic (congestive) heart failure: Secondary | ICD-10-CM

## 2021-01-30 DIAGNOSIS — Z8669 Personal history of other diseases of the nervous system and sense organs: Secondary | ICD-10-CM

## 2021-01-30 DIAGNOSIS — M0579 Rheumatoid arthritis with rheumatoid factor of multiple sites without organ or systems involvement: Secondary | ICD-10-CM | POA: Diagnosis not present

## 2021-01-30 DIAGNOSIS — M19071 Primary osteoarthritis, right ankle and foot: Secondary | ICD-10-CM

## 2021-01-30 DIAGNOSIS — Z79899 Other long term (current) drug therapy: Secondary | ICD-10-CM | POA: Diagnosis not present

## 2021-01-30 DIAGNOSIS — M19072 Primary osteoarthritis, left ankle and foot: Secondary | ICD-10-CM | POA: Diagnosis not present

## 2021-01-30 DIAGNOSIS — M3509 Sicca syndrome with other organ involvement: Secondary | ICD-10-CM

## 2021-01-30 DIAGNOSIS — R5383 Other fatigue: Secondary | ICD-10-CM

## 2021-01-30 DIAGNOSIS — M542 Cervicalgia: Secondary | ICD-10-CM

## 2021-01-30 DIAGNOSIS — M722 Plantar fascial fibromatosis: Secondary | ICD-10-CM

## 2021-01-30 DIAGNOSIS — Z8261 Family history of arthritis: Secondary | ICD-10-CM

## 2021-01-30 DIAGNOSIS — E119 Type 2 diabetes mellitus without complications: Secondary | ICD-10-CM

## 2021-01-30 DIAGNOSIS — M79641 Pain in right hand: Secondary | ICD-10-CM | POA: Diagnosis not present

## 2021-01-30 LAB — CBC WITH DIFFERENTIAL/PLATELET
Absolute Monocytes: 258 cells/uL (ref 200–950)
Basophils Absolute: 20 cells/uL (ref 0–200)
Basophils Relative: 0.7 %
Eosinophils Absolute: 50 cells/uL (ref 15–500)
Eosinophils Relative: 1.8 %
HCT: 42.5 % (ref 35.0–45.0)
Hemoglobin: 13.6 g/dL (ref 11.7–15.5)
Lymphs Abs: 1820 cells/uL (ref 850–3900)
MCH: 27.8 pg (ref 27.0–33.0)
MCHC: 32 g/dL (ref 32.0–36.0)
MCV: 86.9 fL (ref 80.0–100.0)
MPV: 10.6 fL (ref 7.5–12.5)
Monocytes Relative: 9.2 %
Neutro Abs: 652 cells/uL — ABNORMAL LOW (ref 1500–7800)
Neutrophils Relative %: 23.3 %
Platelets: 230 10*3/uL (ref 140–400)
RBC: 4.89 10*6/uL (ref 3.80–5.10)
RDW: 13.3 % (ref 11.0–15.0)
Total Lymphocyte: 65 %
WBC: 2.8 10*3/uL — ABNORMAL LOW (ref 3.8–10.8)

## 2021-01-30 LAB — COMPLETE METABOLIC PANEL WITH GFR
AG Ratio: 1 (calc) (ref 1.0–2.5)
ALT: 18 U/L (ref 6–29)
AST: 17 U/L (ref 10–35)
Albumin: 3.8 g/dL (ref 3.6–5.1)
Alkaline phosphatase (APISO): 57 U/L (ref 37–153)
BUN: 13 mg/dL (ref 7–25)
CO2: 32 mmol/L (ref 20–32)
Calcium: 9.7 mg/dL (ref 8.6–10.4)
Chloride: 107 mmol/L (ref 98–110)
Creat: 0.76 mg/dL (ref 0.50–1.03)
Globulin: 3.7 g/dL (calc) (ref 1.9–3.7)
Glucose, Bld: 114 mg/dL — ABNORMAL HIGH (ref 65–99)
Potassium: 4.2 mmol/L (ref 3.5–5.3)
Sodium: 143 mmol/L (ref 135–146)
Total Bilirubin: 0.8 mg/dL (ref 0.2–1.2)
Total Protein: 7.5 g/dL (ref 6.1–8.1)
eGFR: 90 mL/min/{1.73_m2} (ref >=60)

## 2021-01-30 LAB — QUANTIFERON-TB GOLD PLUS
Mitogen-NIL: >10 IU/mL
NIL: 0.04 IU/mL
QuantiFERON-TB Gold Plus: NEGATIVE
TB1-NIL: 0.02 IU/mL
TB2-NIL: 0.01 IU/mL

## 2021-01-30 NOTE — Progress Notes (Signed)
X-rays of both hands are consistent with osteoarthritis.  No radiographic progression noted since 2020.  No erosive changes noted.

## 2021-01-30 NOTE — Patient Instructions (Addendum)
Standing Labs We placed an order today for your standing lab work.   Please have your standing labs drawn at end of February and every 3 months   If possible, please have your labs drawn 2 weeks prior to your appointment so that the provider can discuss your results at your appointment.  Please note that you may see your imaging and lab results in [facility] before we have reviewed them. We may be awaiting multiple results to interpret others before contacting you. Please allow our office up to 72 hours to thoroughly review all of the results before contacting the office for clarification of your results.  We have open lab daily: Monday through Thursday from 1:30-4:30 PM and Friday from 1:30-4:00 PM at the office of Dr. Bo Merino, [facility].   Please be advised, all patients with office appointments requiring lab work will take precedent over walk-in lab work.  If possible, please come for your lab work on Monday and Friday afternoons, as you may experience shorter wait times. The office is located at [address], [place], [place], [place] [address] No appointment is necessary.   Labs are drawn by [facility]. Please bring your co-pay at the time of your lab draw.  You may receive a bill from [facility] for your lab work.  Please note if you are on Hydroxychloroquine and and an order has been placed for a Hydroxychloroquine level, you will need to have it drawn 4 hours or more after your last dose.  If you wish to have your labs drawn at another location, please call the office 24 hours in advance to send orders.  If you have any questions regarding directions or hours of operation,  please call [phone].   As a reminder, please drink plenty of water prior to coming for your lab work. Thanks!   Hand Exercises Hand exercises can be helpful for almost anyone. These exercises can strengthen the hands, improve flexibility and movement, and increase blood flow to the hands.  These results can make work and daily tasks easier. Hand exercises can be especially helpful for people who have joint pain from arthritis or have nerve damage from overuse (carpal tunnel syndrome). These exercises can also help people who have injured a hand. Exercises Most of these hand exercises are gentle stretching and motion exercises. It is usually safe to do them often throughout the day. Warming up your hands before exercise may help to reduce stiffness. You can do this with gentle massage or by placing your hands in warm water for 10-15 minutes. It is normal to feel some stretching, pulling, tightness, or mild discomfort as you begin new exercises. This will gradually improve. Stop an exercise right away if you feel sudden, severe pain or your pain gets worse. Ask your health care provider which exercises are best for you. Knuckle bend or "claw" fist  Stand or sit with your arm, hand, and all five fingers pointed straight up. Make sure to keep your wrist straight during the exercise. Gently bend your fingers down toward your palm until the tips of your fingers are touching the top of your palm. Keep your big knuckle straight and just bend the small knuckles in your fingers. Hold this position for __________ seconds. Straighten (extend) your fingers back to the starting position. Repeat this exercise 5-10 times with each hand. Full finger fist  Stand or sit with your arm, hand, and all five fingers pointed straight up. Make sure to keep your wrist  straight during the exercise. Gently bend your fingers into your palm until the tips of your fingers are touching the middle of your palm. Hold this position for __________ seconds. Extend your fingers back to the starting position, stretching every joint fully. Repeat this exercise 5-10 times with each hand. Straight fist Stand or sit with your arm, hand, and all five fingers pointed straight up. Make sure to keep your wrist straight during  the exercise. Gently bend your fingers at the big knuckle, where your fingers meet your hand, and the middle knuckle. Keep the knuckle at the tips of your fingers straight and try to touch the bottom of your palm. Hold this position for __________ seconds. Extend your fingers back to the starting position, stretching every joint fully. Repeat this exercise 5-10 times with each hand. Tabletop  Stand or sit with your arm, hand, and all five fingers pointed straight up. Make sure to keep your wrist straight during the exercise. Gently bend your fingers at the big knuckle, where your fingers meet your hand, as far down as you can while keeping the small knuckles in your fingers straight. Think of forming a tabletop with your fingers. Hold this position for __________ seconds. Extend your fingers back to the starting position, stretching every joint fully. Repeat this exercise 5-10 times with each hand. Finger spread  Place your hand flat on a table with your palm facing down. Make sure your wrist stays straight as you do this exercise. Spread your fingers and thumb apart from each other as far as you can until you feel a gentle stretch. Hold this position for __________ seconds. Bring your fingers and thumb tight together again. Hold this position for __________ seconds. Repeat this exercise 5-10 times with each hand. Making circles  Stand or sit with your arm, hand, and all five fingers pointed straight up. Make sure to keep your wrist straight during the exercise. Make a circle by touching the tip of your thumb to the tip of your index finger. Hold for __________ seconds. Then open your hand wide. Repeat this motion with your thumb and each finger on your hand. Repeat this exercise 5-10 times with each hand. Thumb motion  Sit with your forearm resting on a table and your wrist straight. Your thumb should be facing up toward the ceiling. Keep your fingers relaxed as you move your thumb. Lift  your thumb up as high as you can toward the ceiling. Hold for __________ seconds. Bend your thumb across your palm as far as you can, reaching the tip of your thumb for the small finger (pinkie) side of your palm. Hold for __________ seconds. Repeat this exercise 5-10 times with each hand. Grip strengthening  Hold a stress ball or other soft ball in the middle of your hand. Slowly increase the pressure, squeezing the ball as much as you can without causing pain. Think of bringing the tips of your fingers into the middle of your palm. All of your finger joints should bend when doing this exercise. Hold your squeeze for __________ seconds, then relax. Repeat this exercise 5-10 times with each hand. Contact a health care provider if: Your hand pain or discomfort gets much worse when you do an exercise. Your hand pain or discomfort does not improve within 2 hours after you exercise. If you have any of these problems, stop doing these exercises right away. Do not do them again unless your health care provider says that you can. Get help right  away if: You develop sudden, severe hand pain or swelling. If this happens, stop doing these exercises right away. Do not do them again unless your health care provider says that you can. This information is not intended to replace advice given to you by your health care provider. Make sure you discuss any questions you have with your health care provider. Document Revised: 04/07/2020 Document Reviewed: 04/07/2020 Elsevier Patient Education  [place].  Knee Exercises Ask your health care provider which exercises are safe for you. Do exercises exactly as told by your health care provider and adjust them as directed. It is normal to feel mild stretching, pulling, tightness, or discomfort as you do these exercises. Stop right away if you feel sudden pain or your pain gets worse. Do not begin these exercises until told by your health care  provider. Stretching and range-of-motion exercises These exercises warm up your muscles and joints and improve the movement and flexibility of your knee. These exercises also help to relieve pain and swelling. Knee extension, prone  Lie on your abdomen (prone position) on a bed. Place your left / right knee just beyond the edge of the surface so your knee is not on the bed. You can put a towel under your left / right thigh just above your kneecap for comfort. Relax your leg muscles and allow gravity to straighten your knee (extension). You should feel a stretch behind your left / right knee. Hold this position for __________ seconds. Scoot up so your knee is supported between repetitions. Repeat __________ times. Complete this exercise __________ times a day. Knee flexion, active  Lie on your back with both legs straight. If this causes back discomfort, bend your left / right knee so your foot is flat on the floor. Slowly slide your left / right heel back toward your buttocks. Stop when you feel a gentle stretch in the front of your knee or thigh (flexion). Hold this position for __________ seconds. Slowly slide your left / right heel back to the starting position. Repeat __________ times. Complete this exercise __________ times a day. Quadriceps stretch, prone  Lie on your abdomen on a firm surface, such as a bed or padded floor. Bend your left / right knee and hold your ankle. If you cannot reach your ankle or pant leg, loop a belt around your foot and grab the belt instead. Gently pull your heel toward your buttocks. Your knee should not slide out to the side. You should feel a stretch in the front of your thigh and knee (quadriceps). Hold this position for __________ seconds. Repeat __________ times. Complete this exercise __________ times a day. Hamstring, supine  Lie on your back (supine position). Loop a belt or towel over the ball of your left / right foot. The ball of your foot  is on the walking surface, right under your toes. Straighten your left / right knee and slowly pull on the belt to raise your leg until you feel a gentle stretch behind your knee (hamstring). Do not let your knee bend while you do this. Keep your other leg flat on the floor. Hold this position for __________ seconds. Repeat __________ times. Complete this exercise __________ times a day. Strengthening exercises These exercises build strength and endurance in your knee. Endurance is the ability to use your muscles for a long time, even after they get tired. Quadriceps, isometric This exercise strengthens the muscles in front of your thigh (quadriceps) without moving your knee joint (  isometric). Lie on your back with your left / right leg extended and your other knee bent. Put a rolled towel or small pillow under your knee if told by your health care provider. Slowly tense the muscles in the front of your left / right thigh. You should see your kneecap slide up toward your hip or see increased dimpling just above the knee. This motion will push the back of the knee toward the floor. For __________ seconds, hold the muscle as tight as you can without increasing your pain. Relax the muscles slowly and completely. Repeat __________ times. Complete this exercise __________ times a day. Straight leg raises This exercise strengthens the muscles in front of your thigh (quadriceps) and the muscles that move your hips (hip flexors). Lie on your back with your left / right leg extended and your other knee bent. Tense the muscles in the front of your left / right thigh. You should see your kneecap slide up or see increased dimpling just above the knee. Your thigh may even shake a bit. Keep these muscles tight as you raise your leg 4-6 inches (10-15 cm) off the floor. Do not let your knee bend. Hold this position for __________ seconds. Keep these muscles tense as you lower your leg. Relax your muscles  slowly and completely after each repetition. Repeat __________ times. Complete this exercise __________ times a day. Hamstring, isometric  Lie on your back on a firm surface. Bend your left / right knee about __________ degrees. Dig your left / right heel into the surface as if you are trying to pull it toward your buttocks. Tighten the muscles in the back of your thighs (hamstring) to "dig" as hard as you can without increasing any pain. Hold this position for __________ seconds. Release the tension gradually and allow your muscles to relax completely for __________ seconds after each repetition. Repeat __________ times. Complete this exercise __________ times a day. Hamstring curls If told by your health care provider, do this exercise while wearing ankle weights. Begin with __________lb / kg weights. Then increase the weight by 1 lb (0.5 kg) increments. Do not wear ankle weights that are more than __________lb / kg. Lie on your abdomen with your legs straight. Bend your left / right knee as far as you can without feeling pain. Keep your hips flat against the floor. Hold this position for __________ seconds. Slowly lower your leg to the starting position. Repeat __________ times. Complete this exercise __________ times a day. Squats This exercise strengthens the muscles in front of your thigh and knee (quadriceps). Stand in front of a table, with your feet and knees pointing straight ahead. You may rest your hands on the table for balance but not for support. Slowly bend your knees and lower your hips like you are going to sit in a chair. Keep your weight over your heels, not over your toes. Keep your lower legs upright so they are parallel with the table legs. Do not let your hips go lower than your knees. Do not bend lower than told by your health care provider. If your knee pain increases, do not bend as low. Hold the squat position for __________ seconds. Slowly push with your legs  to return to standing. Do not use your hands to pull yourself to standing. Repeat __________ times. Complete this exercise __________ times a day. Wall slides This exercise strengthens the muscles in front of your thigh and knee (quadriceps). Lean your back against a smooth  wall or door, and walk your feet out 18-24 inches (46-61 cm) from it. Place your feet hip-width apart. Slowly slide down the wall or door until your knees bend __________ degrees. Keep your knees over your heels, not over your toes. Keep your knees in line with your hips. Hold this position for __________ seconds. Repeat __________ times. Complete this exercise __________ times a day. Straight leg raises, side-lying This exercise strengthens the muscles that rotate the leg at the hip and move it away from your body (hip abductors). Lie on your side with your left / right leg in the top position. Lie so your head, shoulder, knee, and hip line up. You may bend your bottom knee to help you keep your balance. Roll your hips slightly forward so your hips are stacked directly over each other and your left / right knee is facing forward. Leading with your heel, lift your top leg 4-6 inches (10-15 cm). You should feel the muscles in your outer hip lifting. Do not let your foot drift forward. Do not let your knee roll toward the ceiling. Hold this position for __________ seconds. Slowly return your leg to the starting position. Let your muscles relax completely after each repetition. Repeat __________ times. Complete this exercise __________ times a day. Straight leg raises, prone This exercise stretches the muscles that move your hips away from the front of the pelvis (hip extensors). Lie on your abdomen on a firm surface. You can put a pillow under your hips if that is more comfortable. Tense the muscles in your buttocks and lift your left / right leg about 4-6 inches (10-15 cm). Keep your knee straight as you lift your leg. Hold  this position for __________ seconds. Slowly lower your leg to the starting position. Let your leg relax completely after each repetition. Repeat __________ times. Complete this exercise __________ times a day. This information is not intended to replace advice given to you by your health care provider. Make sure you discuss any questions you have with your health care provider. Document Revised: 08/30/2020 Document Reviewed: 08/30/2020 Elsevier Patient Education  [place].

## 2021-01-30 NOTE — Progress Notes (Signed)
X-rays of both feet are consistent with osteoarthritis.   Increased right 1st MTP narrowing noted compared to x-rays from 2020.   Please notify the patient.

## 2021-01-31 NOTE — Progress Notes (Signed)
TB gold negative

## 2021-02-02 LAB — HM DIABETES EYE EXAM

## 2021-02-06 ENCOUNTER — Ambulatory Visit: Payer: 59 | Admitting: Family

## 2021-02-06 VITALS — BP 125/75 | HR 68 | Temp 98.4°F | Resp 16 | Wt 210.0 lb

## 2021-02-06 DIAGNOSIS — I10 Essential (primary) hypertension: Secondary | ICD-10-CM

## 2021-02-06 LAB — BASIC METABOLIC PANEL
BUN: 10 mg/dL (ref 6–23)
CO2: 31 mEq/L (ref 19–32)
Calcium: 9.8 mg/dL (ref 8.4–10.5)
Chloride: 103 mEq/L (ref 96–112)
Creatinine, Ser: 0.77 mg/dL (ref 0.40–1.20)
GFR: 84.32 mL/min (ref >60.00)
Glucose, Bld: 98 mg/dL (ref 70–99)
Potassium: 4.1 mEq/L (ref 3.5–5.1)
Sodium: 140 mEq/L (ref 135–145)

## 2021-02-06 NOTE — Assessment & Plan Note (Signed)
Initial bp was elevated. Follow up bp was improved. Will continue current dose of valsartan, obtain follow up bmet. Pt will check bp once daily for 1 week and send me her readings via mychart.

## 2021-02-06 NOTE — Patient Instructions (Signed)
Please complete lab work prior to leaving. Check blood pressure once daily for 1 week and send me your readings via mychart.

## 2021-02-06 NOTE — Progress Notes (Signed)
Subjective:     Patient ID: Amber [name], female    DOB: 1960-10-21, 61 y.o.   MRN: [id number]  Chief Complaint  Patient presents with   Hypertension    Here for follow up    HPI Patient is in today for follow up of her hypertension.  Last visit bp was elevated and we changed lisinopril to valsartan 126m.   BP Readings from Last 3 Encounters:  02/06/21 125/75  01/30/21 (!) 168/96  01/23/21 (!) 167/74     Health Maintenance Due  Topic Date Due   COVID-19 Vaccine (4 - Booster for Moderna series) 01/22/2020    Past Medical History:  Diagnosis Date   Anemia    Arthritis    Asthma 05/03/2020   Blood transfusion without reported diagnosis    CAD (coronary artery disease) 04/24/2017   Cerebrovascular disease 10/13/2020   Chronic diastolic CHF (congestive heart failure) ([facility] 04/24/2017   Colon polyps    Controlled type 2 diabetes mellitus without complication, without long-term current use of insulin ([facility] 05/31/2017   Diabetic peripheral neuropathy ([facility] 01/05/2020   Ectopic pregnancy    RIGHT salpingostomy   Elective abortion    Elective abortion 09/26/2020   RIGHT salpingostomy   Essential hypertension 09/04/2017   GERD (gastroesophageal reflux disease)    Glaucoma    both eyes   Headache    migraines   History of chicken pox    History of chicken pox 09/26/2020   migraines   Hyperlipemia    Hypertension    Iron deficiency anemia 09/02/2013   Migraine 04/19/2014   Obesity (BMI 30-39.9) 10/17/2012   Palpitations 10/01/2012   Rheumatoid arthritis ([facility] 11/07/2011   Rheumatoid arthritis(714.0) 11/07/2011   SAB (spontaneous abortion)    SAB (spontaneous abortion) 11/07/2011   Shortness of breath    Sjogren's disease ([facility]    Wheezing     Past Surgical History:  Procedure Laterality Date   ABDOMINAL SURGERY     bowel repair   COLONOSCOPY     DILATION AND CURETTAGE OF UTERUS     X 2   MENISCUS REPAIR Right    MENISCUS TEAR REPAIR   MYOMECTOMY N/A 07/27/2014    Procedure: ABDOMINAL MYOMECTOMY;  Surgeon: EWaymon Amato MD;  [facility];  Service: Gynecology;  Laterality: N/A;   PELVIC LAPAROSCOPY  1994   IN 1999 LAPAROSCOPY WITH INCIDENTAL ENTEROTOMY REQUIRING LAPAROTOMY   UTERINE FIBROID SURGERY     July 2016    Family History  Problem Relation Age of Onset   Hypertension Mother        died from heart disease   Heart disease Mother        deceased, unknown cause   Stroke Mother    Obesity Mother    Diabetes Sister    Hypertension Sister    Esophageal cancer Sister    Stomach cancer Sister    Cancer Sister    Diabetes Sister    Rectal cancer Neg Hx    Colon cancer Neg Hx     Social History   Socioeconomic History   Marital status: Single    Spouse name: Not on file   Number of children: 0   Years of education: college   Highest education level: Not on file  Occupational History   Occupation: LPN    Employer: [employer]  Tobacco Use   Smoking status: Never   Smokeless tobacco: Never  Vaping Use   Vaping Use: Never used  Substance  and Sexual Activity   Alcohol use: No    Alcohol/week: 0.0 standard drinks   Drug use: No   Sexual activity: Yes    Birth control/protection: None    Comment: 1st intercourse- 79, partners- 5  Other Topics Concern   Not on file  Social History Narrative   Regular exercise:  No   Caffeine Use: 2 cups coffee daily   No children   "Happily divorced"   Reports that she is involved at her church   Works as an Therapist, 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  Had a sister up in [place] who passed away March 04, 2015 who she was close to.   Born in [place]- she came to [place] as adult.  Age 43.    Left-handed.         Social Determinants of Health   Financial Resource Strain: Not on file  Food Insecurity: Not on file  Transportation Needs: Not on file  Physical Activity: Not on file  Stress: Not on file  Social Connections: Not on file  Intimate Partner Violence: Not on file    Outpatient Medications Prior to  Visit  Medication Sig Dispense Refill   Abatacept (ORENCIA CLICKJECT) 563 MG/ML SOAJ Give 1 injection (125 mg) subcutaneously once every 10 days. 4 mL 0   albuterol (VENTOLIN HFA) 108 (90 Base) MCG/ACT inhaler Inhale 2 puffs into the lungs every 6 (six) hours as needed for wheezing or shortness of breath. 18 g 5   aspirin EC 81 MG tablet Take 1 tablet (81 mg total) by mouth daily. 90 tablet 3   blood glucose meter kit and supplies KIT Dispense based on patient and insurance preference. Use up to four times daily as directed. (FOR ICD-9 250.00, 250.01). 1 each 0   Blood Pressure Monitoring (ADULT BLOOD PRESSURE CUFF LG) KIT Use as directed 1 kit 0   calcium-vitamin D (OSCAL WITH D) 500-200 MG-UNIT TABS tablet Take by mouth.     Cyanocobalamin (VITAMIN B-12) 5000 MCG LOZG Take 1 tablet by mouth 2 (two) times daily.     dapagliflozin propanediol (FARXIGA) 10 MG TABS tablet Take 1 tablet (10 mg total) by mouth daily before breakfast. 90 tablet 3   diltiazem (CARDIZEM CD) 180 MG 24 hr capsule Take 1 capsule (180 mg total) by mouth daily. 90 capsule 3   fluticasone (FLOVENT HFA) 110 MCG/ACT inhaler 2 puffs into the lungs every 12 hours as needed. 1 each 1   furosemide (LASIX) 20 MG tablet Take 1 tablet (20 mg total) by mouth daily. (Patient taking differently: Take 20 mg by mouth as needed.) 30 tablet 3   hydrochlorothiazide (HYDRODIURIL) 25 MG tablet Take 1 tablet (25 mg total) by mouth daily. 90 tablet 1   metoprolol succinate (TOPROL-XL) 100 MG 24 hr tablet Take 1 tablet (100 mg total) by mouth daily. Take with or immediately following a meal. 90 tablet 1   rosuvastatin (CRESTOR) 10 MG tablet Take 1 tablet (10 mg total) by mouth daily. 90 tablet 3   SUMAtriptan (IMITREX) 50 MG tablet TAKE 1 TABLET BY MOUTH EVERY 2 HOURS AS NEEDED FOR  MIGRAINE  MAY  REPEAT  IN  2  HOURS  IF  HEADACHE  PERSISTS  OR  RECURS 9 tablet 5   topiramate (TOPAMAX) 25 MG tablet Take one tablet at night for one week, then take 2  tablets at night for one week, then take 3 tablets at night. 90 tablet 3   TRAVATAN Z 0.004 % SOLN ophthalmic  solution Place 1 drop into both eyes at bedtime.      valsartan (DIOVAN) 160 MG tablet Take 1 tablet (160 mg total) by mouth daily. 30 tablet 0   No facility-administered medications prior to visit.    No Known Allergies  ROS     Objective:    Physical Exam  BP 125/75    Pulse 68    Temp 98.4 F (36.9 C) (Oral)    Resp 16    Wt 210 lb (95.3 kg)    LMP 08/09/2016    SpO2 99%    BMI 39.68 kg/m  Wt Readings from Last 3 Encounters:  02/06/21 210 lb (95.3 kg)  01/30/21 213 lb 9.6 oz (96.9 kg)  01/23/21 209 lb (94.8 kg)       Assessment & Plan:   Problem List Items Addressed This Visit       Unprioritized   Primary hypertension - Primary    Initial bp was elevated. Follow up bp was improved. Will continue current dose of valsartan, obtain follow up bmet. Pt will check bp once daily for 1 week and send me her readings via mychart.       Relevant Orders   Basic metabolic panel   She notes a lot of family stress. Declines counseling at this time.   I am having Ivyrose D. Finklea maintain her Travatan Z, Vitamin B-12, calcium-vitamin D, albuterol, aspirin EC, Adult Blood Pressure Cuff Lg, blood glucose meter kit and supplies, metoprolol succinate, hydrochlorothiazide, SUMAtriptan, furosemide, fluticasone, topiramate, rosuvastatin, dapagliflozin propanediol, diltiazem, valsartan, and Orencia ClickJect.  No orders of the defined types were placed in this encounter.

## 2021-02-13 ENCOUNTER — Other Ambulatory Visit: Payer: Self-pay

## 2021-02-13 ENCOUNTER — Ambulatory Visit (INDEPENDENT_AMBULATORY_CARE_PROVIDER_SITE_OTHER): Payer: 59 | Admitting: Psychiatry

## 2021-02-13 ENCOUNTER — Encounter: Payer: Self-pay | Admitting: Psychiatry

## 2021-02-13 VITALS — BP 152/94 | HR 88 | Ht 61.0 in | Wt 212.0 lb

## 2021-02-13 DIAGNOSIS — G43719 Chronic migraine without aura, intractable, without status migrainosus: Secondary | ICD-10-CM | POA: Diagnosis not present

## 2021-02-13 MED ORDER — SUMATRIPTAN SUCCINATE 100 MG PO TABS: 100.0000 mg | ORAL_TABLET | ORAL | 3 refills | Status: DC | PRN

## 2021-02-13 MED ORDER — EMGALITY 120 MG/ML ~~LOC~~ SOAJ: 120.0000 mg | SUBCUTANEOUS | 3 refills | Status: DC

## 2021-02-13 NOTE — Patient Instructions (Signed)
Decrease Topamax to 1 pill at bedtime for one week, then stop Start Emgality monthly for migraine prevention. First dose is 2 injections, then each dose after that is one injection monthly Increase Imitrex to 100 mg as needed

## 2021-02-13 NOTE — Progress Notes (Signed)
° °  CC:  headaches  Follow-up Visit  Last visit: 10/13/20  Brief HPI: 61 year old female with a history of RA, Sjogren's syndrome, glaucoma, and prediabetes who follows in clinic for migraines.  At her last visit she was started on Topamax for headache prevention  Interval History: Headaches have not changed since her last visit. Topamax has not made much of a difference. She takes 50 mg at bedtime but this makes her drowsy. She is having 4 headaches per week. Also has started having shooting pains one month ago. She will have 2-3 shooting pains which only last a few seconds at a time.  Imitrex helps sometimes but does not fully relieve her headache. She also takes Aleve with it which helps.  Headache days per month: 16 Headache free days per month: 14  Current Headache Regimen: Preventative: Topamax 50 mg QHS Abortive: Aleve, Imitrex 50 mg PRN   Prior Therapies                                  Topamax 50 mg QHS - drowsiness Imitrex 50 mg PRN Metoprolol Valsartan lisinopril  Physical Exam:   Vital Signs: BP (!) 152/94    Pulse 88    Ht 5\' 1"  (1.549 m)    Wt 212 lb (96.2 kg)    LMP 08/09/2016    SpO2 97%    BMI 40.06 kg/m  GENERAL:  well appearing, in no acute distress, alert  SKIN:  Color, texture, turgor normal. No rashes or lesions HEAD:  Normocephalic/atraumatic. RESP: normal respiratory effort MSK:  No gross joint deformities.   NEUROLOGICAL: Mental Status: Alert, oriented to person, place and time, Follows commands, and Speech fluent and appropriate. Cranial Nerves: PERRL, face symmetric, no dysarthria, hearing grossly intact Motor: moves all extremities equally Gait: normal-based.  IMPRESSION: 61 year old female with a history of RA, Sjogren's syndrome, glaucoma, and prediabetes who presents for follow up of migraines. Topamax was ineffective and she was unable to tolerate it even at low doses. Will switch to [facility] for migraine prevention. Will increase  Imitrex to 100 mg PRN and see if this is more effective than her current dose.  PLAN: -Prevention: Stop Topamax. Start Emgality 120 mg monthly (loading dose provided in office today) -Rescue: Increase Imitrex to 100 mg PRN -next steps: consider Botox   Follow-up: 3 months  I spent a total of 22 minutes on the date of the service. Discussed treatment options including preventive and acute medications.  Written educational materials and patient instructions outlining all of the above were given.  Genia Harold, MD 02/13/21 9:33 AM

## 2021-02-14 ENCOUNTER — Telehealth: Payer: Self-pay

## 2021-02-14 NOTE — Telephone Encounter (Signed)
PA for emgality has been sent via cover my meds. PA was instantly approved.   (Key: [id number])  This request has received a favorable outcome and is approved. Please note any additional information provided by [employer] at the bottom of your screen. You will also receive a faxed copy of the determination. If you have any questions please contact [employer] at [phone]. PA Case: [id number], Status: Approved, Coverage Starts on: 02/14/2021 12:00:00 AM, Coverage Ends on: 08/13/2021 12:00:00 AM.

## 2021-03-03 ENCOUNTER — Other Ambulatory Visit: Payer: Self-pay | Admitting: *Deleted

## 2021-03-03 DIAGNOSIS — M3509 Sicca syndrome with other organ involvement: Secondary | ICD-10-CM

## 2021-03-03 DIAGNOSIS — Z79899 Other long term (current) drug therapy: Secondary | ICD-10-CM

## 2021-03-05 NOTE — Progress Notes (Signed)
WBC count is better.  Stay on the current dose of Orencia.  We will continue to check labs every 3 months

## 2021-03-06 NOTE — Progress Notes (Signed)
Ro and La antibodies remain positive.  ANA remains positive. RF positive but is trending down. Complements WNL.  ? ?2+ protein in urine.  Please add protein creatinine ratio.  ?

## 2021-03-07 ENCOUNTER — Other Ambulatory Visit: Payer: Self-pay | Admitting: *Deleted

## 2021-03-07 DIAGNOSIS — Z79899 Other long term (current) drug therapy: Secondary | ICD-10-CM

## 2021-03-07 DIAGNOSIS — R899 Unspecified abnormal finding in specimens from other organs, systems and tissues: Secondary | ICD-10-CM

## 2021-03-07 NOTE — Progress Notes (Signed)
Protein creatinine ratio is elevated. Please clarify if she has seen a nephrologist in the past.

## 2021-03-09 LAB — CBC WITH DIFFERENTIAL/PLATELET
Absolute Monocytes: 209 cells/uL (ref 200–950)
Basophils Absolute: 22 cells/uL (ref 0–200)
Basophils Relative: 0.6 %
Eosinophils Absolute: 238 cells/uL (ref 15–500)
Eosinophils Relative: 6.6 %
HCT: 40.9 % (ref 35.0–45.0)
Hemoglobin: 13 g/dL (ref 11.7–15.5)
Lymphs Abs: 1955 cells/uL (ref 850–3900)
MCH: 27.4 pg (ref 27.0–33.0)
MCHC: 31.8 g/dL — ABNORMAL LOW (ref 32.0–36.0)
MCV: 86.1 fL (ref 80.0–100.0)
MPV: 11.2 fL (ref 7.5–12.5)
Monocytes Relative: 5.8 %
Neutro Abs: 1177 cells/uL — ABNORMAL LOW (ref 1500–7800)
Neutrophils Relative %: 32.7 %
Platelets: 217 10*3/uL (ref 140–400)
RBC: 4.75 10*6/uL (ref 3.80–5.10)
RDW: 12.8 % (ref 11.0–15.0)
Total Lymphocyte: 54.3 %
WBC: 3.6 10*3/uL — ABNORMAL LOW (ref 3.8–10.8)

## 2021-03-09 LAB — URINALYSIS, ROUTINE W REFLEX MICROSCOPIC
Bacteria, UA: NONE SEEN /HPF
Bilirubin Urine: NEGATIVE
Glucose, UA: NEGATIVE
Hgb urine dipstick: NEGATIVE
Hyaline Cast: NONE SEEN /LPF
Ketones, ur: NEGATIVE
Leukocytes,Ua: NEGATIVE
Nitrite: NEGATIVE
Specific Gravity, Urine: 1.022 (ref 1.001–1.035)
Squamous Epithelial / HPF: NONE SEEN /HPF (ref <=5)
WBC, UA: NONE SEEN /HPF (ref 0–5)
pH: 7.5 (ref 5.0–8.0)

## 2021-03-09 LAB — TEST AUTHORIZATION

## 2021-03-09 LAB — ANA: Anti Nuclear Antibody (ANA): POSITIVE — AB

## 2021-03-09 LAB — PROTEIN ELECTROPHORESIS, SERUM, WITH REFLEX
Albumin ELP: 3.9 g/dL (ref 3.8–4.8)
Alpha 1: 0.3 g/dL (ref 0.2–0.3)
Alpha 2: 0.7 g/dL (ref 0.5–0.9)
Beta 2: 0.5 g/dL (ref 0.2–0.5)
Beta Globulin: 0.5 g/dL (ref 0.4–0.6)
Gamma Globulin: 2.2 g/dL — ABNORMAL HIGH (ref 0.8–1.7)
Total Protein: 8.2 g/dL — ABNORMAL HIGH (ref 6.1–8.1)

## 2021-03-09 LAB — C3 AND C4
C3 Complement: 181 mg/dL (ref 83–193)
C4 Complement: 31 mg/dL (ref 15–57)

## 2021-03-09 LAB — ANTI-NUCLEAR AB-TITER (ANA TITER): ANA Titer 1: 1:1280 {titer} — ABNORMAL HIGH

## 2021-03-09 LAB — SJOGRENS SYNDROME-A EXTRACTABLE NUCLEAR ANTIBODY: SSA (Ro) (ENA) Antibody, IgG: >8 AI — AB

## 2021-03-09 LAB — PROTEIN / CREATININE RATIO, URINE
Creatinine, Urine: 188 mg/dL (ref 20–275)
Protein/Creat Ratio: 335 mg/g creat — ABNORMAL HIGH (ref 24–184)
Protein/Creatinine Ratio: 0.335 mg/mg creat — ABNORMAL HIGH (ref 0.024–0.184)
Total Protein, Urine: 63 mg/dL — ABNORMAL HIGH (ref 5–24)

## 2021-03-09 LAB — RHEUMATOID FACTOR: Rheumatoid fact SerPl-aCnc: 14 IU/mL — ABNORMAL HIGH (ref <14)

## 2021-03-09 LAB — SJOGRENS SYNDROME-B EXTRACTABLE NUCLEAR ANTIBODY: SSB (La) (ENA) Antibody, IgG: >8 AI — AB

## 2021-03-09 NOTE — Progress Notes (Signed)
WBC count is better.  Patient may continue current treatment.

## 2021-03-09 NOTE — Progress Notes (Signed)
SPEP did not reveal any abnormal protein bands.

## 2021-04-17 NOTE — Progress Notes (Signed)
? ?Office Visit Note ? ?Patient: Amber [name]             ?Date of Birth: 1960/05/03           ?MRN: [id number]             ?PCP: [name], NP ?Referring: [name], NP ?Visit Date: 05/01/2021 ?Occupation: '@GUAROCC'$ @ ? ?Subjective:  ?Medication management ? ?History of Present Illness: Amber [name] is a 61 y.o. female with history of rheumatoid arthritis, osteoarthritis and Sjogren's syndrome.  She continues to have dry mouth and dry eyes.  She has been taking Orencia injections every 10 days and has been tolerating it well.  She denies any joint swelling.  She has been having some discomfort in her wrist joints and her knee joints.  She continues to has off-and-on symptoms from [place] fasciitis.  Has been followed by podiatrist.  Dr. Thedore Mins for her cervical spine discomfort. ? ?Activities of Daily Living:  ?Patient reports morning stiffness for 30 minutes.   ?Patient Reports nocturnal pain.  ?Difficulty dressing/grooming: Denies ?Difficulty climbing stairs: Denies ?Difficulty getting out of chair: Denies ?Difficulty using hands for taps, buttons, cutlery, and/or writing: Denies ? ?Review of Systems  ?Constitutional:  Positive for fatigue.  ?HENT:  Positive for mouth dryness and nose dryness. Negative for mouth sores.   ?Eyes:  Positive for dryness. Negative for pain and itching.  ?Respiratory:  Negative for shortness of breath and difficulty breathing.   ?Cardiovascular:  Negative for chest pain and palpitations.  ?Gastrointestinal:  Negative for blood in stool, constipation and diarrhea.  ?Endocrine: Negative for increased urination.  ?Genitourinary:  Negative for difficulty urinating.  ?Musculoskeletal:  Positive for joint pain, joint pain, joint swelling and morning stiffness. Negative for myalgias, muscle tenderness and myalgias.  ?Skin:  Negative for color change, rash, redness and sensitivity to sunlight.  ?Allergic/Immunologic: Negative for susceptible to infections.   ?Neurological:  Positive for dizziness, numbness and headaches. Negative for memory loss and weakness.  ?Hematological:  Negative for bruising/bleeding tendency and swollen glands.  ?Psychiatric/Behavioral:  Negative for depressed mood, confusion and sleep disturbance. The patient is not nervous/anxious.   ? ?PMFS History:  ?Patient Active Problem List  ? Diagnosis Date Noted  ? High risk HPV infection 10/17/2020  ? Cerebrovascular disease 10/13/2020  ? Colon polyps 09/26/2020  ? GERD (gastroesophageal reflux disease) 09/26/2020  ? B12 deficiency 05/03/2020  ? Hyperlipidemia 05/03/2020  ? Asthma 05/03/2020  ? Diabetic peripheral neuropathy ([place]) 01/05/2020  ? Class 2 severe obesity with serious comorbidity and body mass index (BMI) of 38.0 to 38.9 in adult, unspecified obesity type ([place]) 01/05/2020  ? Adrenal mass, right ([place]) 01/05/2020  ? Hypertensive heart disease 09/04/2017  ? Controlled type 2 diabetes mellitus without complication, without long-term current use of insulin ([place]) 05/31/2017  ? Chronic diastolic CHF (congestive heart failure) ([place]) 04/24/2017  ? CAD (coronary artery disease) 04/24/2017  ? History of BCG vaccination 06/14/2016  ? Fibroids 07/27/2014  ? Migraine 04/19/2014  ? Nail fungus 04/09/2014  ? Elevated serum protein level 04/09/2014  ? Preventative health care 04/09/2014  ? Iron deficiency anemia 09/02/2013  ? Headache(784.0) 03/04/2013  ? Obesity (BMI 30-39.9) 10/17/2012  ? Palpitations 10/01/2012  ? Glaucoma 11/07/2011  ? Rheumatoid arthritis ([place]) 11/07/2011  ? Primary hypertension 11/05/2011  ? Sjogren's disease ([place]) 11/05/2011  ? Anemia 09/22/2010  ? Fibroid uterus 09/22/2010  ? Neutropenia ([place]) 09/22/2010  ?  ?Past Medical History:  ?Diagnosis Date  ? Anemia   ?  Arthritis   ? Asthma 05/03/2020  ? Blood transfusion without reported diagnosis   ? CAD (coronary artery disease) 04/24/2017  ? Cerebrovascular disease 10/13/2020  ? Chronic diastolic CHF (congestive heart failure) ([place])  04/24/2017  ? Colon polyps   ? Controlled type 2 diabetes mellitus without complication, without long-term current use of insulin ([place]) 05/31/2017  ? Diabetic peripheral neuropathy ([place]) 01/05/2020  ? Ectopic pregnancy   ? RIGHT salpingostomy  ? Elective abortion   ? Elective abortion 09/26/2020  ? RIGHT salpingostomy  ? Essential hypertension 09/04/2017  ? GERD (gastroesophageal reflux disease)   ? Glaucoma   ? both eyes  ? Headache   ? migraines  ? History of chicken pox   ? History of chicken pox 09/26/2020  ? migraines  ? Hyperlipemia   ? Hypertension   ? Iron deficiency anemia 09/02/2013  ? Migraine 04/19/2014  ? Obesity (BMI 30-39.9) 10/17/2012  ? Palpitations 10/01/2012  ? Rheumatoid arthritis ([place]) 11/07/2011  ? Rheumatoid arthritis(714.0) 11/07/2011  ? SAB (spontaneous abortion)   ? SAB (spontaneous abortion) 11/07/2011  ? Shortness of breath   ? Sjogren's disease ([place])   ? Wheezing   ?  ?Family History  ?Problem Relation Age of Onset  ? Hypertension Mother   ?     died from heart disease  ? Heart disease Mother   ?     deceased, unknown cause  ? Stroke Mother   ? Obesity Mother   ? Diabetes Sister   ? Hypertension Sister   ? Esophageal cancer Sister   ? Stomach cancer Sister   ? Cancer Sister   ? Diabetes Sister   ? Rectal cancer Neg Hx   ? Colon cancer Neg Hx   ? ?Past Surgical History:  ?Procedure Laterality Date  ? ABDOMINAL SURGERY    ? bowel repair  ? COLONOSCOPY    ? DILATION AND CURETTAGE OF UTERUS    ? X 2  ? MENISCUS REPAIR Right   ? MENISCUS TEAR REPAIR  ? MYOMECTOMY N/A 07/27/2014  ? Procedure: ABDOMINAL MYOMECTOMY;  Surgeon: Waymon Amato, MD;  Location: [facility];  Service: Gynecology;  Laterality: N/A;  ? PELVIC LAPAROSCOPY  1994  ? IN 04/03/1997 LAPAROSCOPY WITH INCIDENTAL ENTEROTOMY REQUIRING LAPAROTOMY  ? UTERINE FIBROID SURGERY    ? July 2016  ? ?Social History  ? ?Social History Narrative  ? Regular exercise:  No  ? Caffeine Use: 2 cups coffee daily  ? No children  ? "Happily divorced"  ? Reports that she is  involved at her church  ? 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  Had a sister up in [place] who passed away 04-Apr-2015 who she was close to.  ? Born in [place]- she came to [place] as adult.  Age 38.   ? Left-handed.  ?   ?   ? ?Immunization History  ?Administered Date(s) Administered  ? Hepatitis A, Adult 11/01/2017, 12/03/2018  ? IPV 11/01/2017  ? Influenza Split 11/07/2011  ? Influenza,inj,Quad PF,6+ Mos 10/17/2012, 01/15/2014, 08/31/2014, 09/30/2017, 09/10/2018, 09/21/2019, 10/07/2020  ? Meningococcal Mcv4o 11/01/2017  ? Moderna Sars-Covid-2 Vaccination 01/28/2019, 02/27/2019, 11/27/2019  ? Pneumococcal Conjugate-13 08/05/2020  ? Pneumococcal Polysaccharide-23 09/10/2018  ? Tdap 06/01/2011, 11/25/2012  ? Typhoid Live 11/01/2017  ? Zoster Recombinat (Shingrix) 10/04/2017, 12/04/2017  ?  ? ?Objective: ?Vital Signs: BP 134/88 (BP Location: Right Arm, Patient Position: Sitting, Cuff Size: Large)   Pulse 73   Ht '5\' 1"'$  (1.549 m)   Wt 201 lb  12.8 oz (91.5 kg)   LMP 08/09/2016   BMI 38.13 kg/m?   ? ?Physical Exam ?Vitals and nursing note reviewed.  ?Constitutional:   ?   Appearance: She is well-developed.  ?HENT:  ?   Head: Normocephalic and atraumatic.  ?Eyes:  ?   Conjunctiva/sclera: Conjunctivae normal.  ?Cardiovascular:  ?   Rate and Rhythm: Normal rate and regular rhythm.  ?   Heart sounds: Normal heart sounds.  ?Pulmonary:  ?   Effort: Pulmonary effort is normal.  ?   Breath sounds: Normal breath sounds.  ?Abdominal:  ?   General: Bowel sounds are normal.  ?   Palpations: Abdomen is soft.  ?Musculoskeletal:  ?   Cervical back: Normal range of motion.  ?Lymphadenopathy:  ?   Cervical: No cervical adenopathy.  ?Skin: ?   General: Skin is warm and dry.  ?   Capillary Refill: Capillary refill takes less than 2 seconds.  ?Neurological:  ?   Mental Status: She is alert and oriented to person, place, and time.  ?Psychiatric:     ?   Behavior: Behavior normal.  ?  ? ?Musculoskeletal Exam: She has discomfort range of  motion of the cervical spine.  Shoulder joints with good range of motion with some discomfort in her left shoulder.  Elbow joints and wrist joints with good range of motion.  She there was no synovitis over wrist joints, MCP

## 2021-04-19 ENCOUNTER — Ambulatory Visit (INDEPENDENT_AMBULATORY_CARE_PROVIDER_SITE_OTHER): Payer: 59 | Admitting: Podiatry

## 2021-04-19 ENCOUNTER — Ambulatory Visit (INDEPENDENT_AMBULATORY_CARE_PROVIDER_SITE_OTHER): Payer: 59

## 2021-04-19 DIAGNOSIS — M722 Plantar fascial fibromatosis: Secondary | ICD-10-CM | POA: Diagnosis not present

## 2021-04-19 DIAGNOSIS — M779 Enthesopathy, unspecified: Secondary | ICD-10-CM

## 2021-04-19 DIAGNOSIS — M7751 Other enthesopathy of right foot: Secondary | ICD-10-CM | POA: Diagnosis not present

## 2021-04-19 DIAGNOSIS — M7752 Other enthesopathy of left foot: Secondary | ICD-10-CM | POA: Diagnosis not present

## 2021-04-19 MED ORDER — TRIAMCINOLONE ACETONIDE 10 MG/ML IJ SUSP
30.0000 mg | Freq: Once | INTRAMUSCULAR | Status: AC
  Administered 2021-04-19: 30 mg

## 2021-04-20 NOTE — Progress Notes (Signed)
Subjective:  ? ?Patient ID: Amber [name], female   DOB: 61 y.o.   MRN: [id number]  ? 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  States she also feels like she does not have enough arch support and gentle ? ? ?ROS ? ? ?   ?Objective:  ?Physical Exam  ?Neurovascular status was found to be intact with pain that is the centralized but I did note the pain seems to be worse in the sinus tarsi right and left and also in the plantar heel right with inflammation and is noted to have moderate depression of the arch bilateral that is probably contributory towards the condition ? ?   ?Assessment:  ?Appears to be a generalized inflammatory condition but it appears that the sinus tarsi bilateral is worse and also the plantar fascia right over left ? ?   ?Plan:  ?H&P reviewed both conditions and x-rays and today did sterile prep and injected the sinus tarsi bilateral 3 mg dexamethasone Kenalog 5 mg Xylocaine injected the right plantar fascia 3 mg Kenalog 5 mg Xylocaine applied fascial brace to lift the arches bilateral and placed on Sterapred DS 12-day Dosepak to reduce the inflammatory complex.  Patient will be seen back to reevaluate may need orthotics or other treatment and may order blood work if symptoms persist ? ?X-rays indicate that there is moderate spur formation depression of the arch no active arthritis or stress fracture was noted ?   ? ? ?

## 2021-04-21 ENCOUNTER — Telehealth: Payer: Self-pay | Admitting: Pharmacist

## 2021-04-21 DIAGNOSIS — I10 Essential (primary) hypertension: Secondary | ICD-10-CM

## 2021-04-21 MED ORDER — HYDROCHLOROTHIAZIDE 25 MG PO TABS: 25.0000 mg | ORAL_TABLET | Freq: Every day | ORAL | 0 refills | Status: DC

## 2021-04-21 MED ORDER — METOPROLOL SUCCINATE ER 100 MG PO TB24: 100.0000 mg | ORAL_TABLET | Freq: Every day | ORAL | 0 refills | Status: DC

## 2021-04-21 NOTE — Telephone Encounter (Signed)
OK, thanks. I will discuss with her when she comes in.  ?

## 2021-04-21 NOTE — Addendum Note (Signed)
Addended by: Cherre Robins B on: 04/21/2021 01:40 PM ? ? Modules accepted: Orders ? ?

## 2021-04-21 NOTE — Telephone Encounter (Signed)
Patient called back and discussed hypertension therapy. She endorsed that Dr Zenda Alpers and discussed possibly changing to olmesartan hydrochlorothiazide but Rx was not called in. She has continued to take Valsaratn 160 mg daily; Metoprolol succinate ER 100 mg daily; diltiazem '180mg'$  daily;  HCTZ '25mg'$  daily ? ?Patient has an automated upper arm home BP machine. ? ?Current blood pressure readings: About 1 month ago she reports systolic blood pressure was 130 to 145 but recently has been 148 to 154. DBP has been 70 to 85.  ? ?Patient denies hypotensive signs and symptoms including dizziness, lightheadedness.  ?Patient reports hypertensive symptoms including occasional headache but has migraines from time to time, not sure if is from blood pressure. Denies chest pain, shortness of breath. ? ?Patient was referred to weight loss clinic but she is on waiting list (1 year wait) ? ?Assessment/Plan: ?- Currently uncontrolled ?- - Reviewed goal blood pressure <140/90 ?- Reviewed appropriate administration of medication regimen ?- Counseled on long term microvascular and macrovascular complications of uncontrolled hypertension ?- Reviewed appropriate home BP monitoring technique (avoid caffeine, smoking, and exercise for 30 minutes before checking, rest for at least 5 minutes before taking BP, sit with feet flat on the floor and back against a hard surface, uncross legs, and rest arm on flat surface) ?- Reviewed to check blood pressure daily, document, and provide at next provider visit ?- Discussed dietary modifications, such as reduced salt intake, focus on whole grains, vegetables, lean proteins ?- Patient has type 2 DM on problems list. She is currently taking [place] (for CHF and DM) and has taken Victoza in past. Her last A1c was at goal but GIP / GLP1 agent could be considered for DM and weight loss. Reviewed her insurance formulary - Darcel Bayley is not covered; Ozempic and Trulicity are covered but require prior authorization  and her current A1c might not qualify her. Patient has follow up with PCP  ? ?SIG ? ?

## 2021-04-21 NOTE — Telephone Encounter (Signed)
Sent in updated prescriptions for hydrochlorothiazide and metoprolol ER '100mg'$  for 90 days. ?

## 2021-04-21 NOTE — Telephone Encounter (Signed)
Patient appearing on report for [facility] - Hypertension Control report due to last documented ambulatory blood pressure of 152/94 on 02/06/2021. Next appointment with PCP is 05/08/2021.  ? ?Current antihypertensives: Valsaratn 160 mg daily; Metoprolol succinate ER 100 mg daily; valsartan '160mg'$  daily; HCTZ '25mg'$  daily ? ?03/29/2021 - Nephrology (Dr Zenda Alpers) referred by rheumatlogist for proteinurina. Dr Zenda Alpers checked labs and noted proteinuria and Scr improved (not CKD).  ?His noted mentions changing valsartan and HCTZ to Olmesartan-HCTZ 40/'25mg'$  once daily, but her refill report did not show olmesartan hydrochlorothiazide. Also called [facility] where she fills her medications and they did not have olmesartan hydrochlorothiazide on her profile.  ? ?Attempted to outreached patient to discuss hypertension control and medication management.  Unable to reach patient. LM on VM with CB [phone] and [phone] ? ?Cherre Robins, PharmD ?Clinical Pharmacist ?[facility] ?[place] ? ? ?

## 2021-05-01 ENCOUNTER — Telehealth: Payer: Self-pay | Admitting: Pharmacist

## 2021-05-01 ENCOUNTER — Ambulatory Visit (INDEPENDENT_AMBULATORY_CARE_PROVIDER_SITE_OTHER): Payer: 59 | Admitting: Rheumatology

## 2021-05-01 ENCOUNTER — Encounter: Payer: Self-pay | Admitting: Rheumatology

## 2021-05-01 VITALS — BP 134/88 | HR 73 | Ht 61.0 in | Wt 201.8 lb

## 2021-05-01 DIAGNOSIS — R002 Palpitations: Secondary | ICD-10-CM

## 2021-05-01 DIAGNOSIS — M0579 Rheumatoid arthritis with rheumatoid factor of multiple sites without organ or systems involvement: Secondary | ICD-10-CM

## 2021-05-01 DIAGNOSIS — R5383 Other fatigue: Secondary | ICD-10-CM

## 2021-05-01 DIAGNOSIS — Z8669 Personal history of other diseases of the nervous system and sense organs: Secondary | ICD-10-CM

## 2021-05-01 DIAGNOSIS — Z79899 Other long term (current) drug therapy: Secondary | ICD-10-CM | POA: Diagnosis not present

## 2021-05-01 DIAGNOSIS — M3509 Sicca syndrome with other organ involvement: Secondary | ICD-10-CM

## 2021-05-01 DIAGNOSIS — Z8261 Family history of arthritis: Secondary | ICD-10-CM

## 2021-05-01 DIAGNOSIS — E119 Type 2 diabetes mellitus without complications: Secondary | ICD-10-CM

## 2021-05-01 DIAGNOSIS — I5032 Chronic diastolic (congestive) heart failure: Secondary | ICD-10-CM

## 2021-05-01 DIAGNOSIS — M19072 Primary osteoarthritis, left ankle and foot: Secondary | ICD-10-CM

## 2021-05-01 DIAGNOSIS — M722 Plantar fascial fibromatosis: Secondary | ICD-10-CM

## 2021-05-01 DIAGNOSIS — M542 Cervicalgia: Secondary | ICD-10-CM

## 2021-05-01 DIAGNOSIS — M19071 Primary osteoarthritis, right ankle and foot: Secondary | ICD-10-CM

## 2021-05-01 DIAGNOSIS — I10 Essential (primary) hypertension: Secondary | ICD-10-CM

## 2021-05-01 DIAGNOSIS — M170 Bilateral primary osteoarthritis of knee: Secondary | ICD-10-CM

## 2021-05-01 DIAGNOSIS — I2510 Atherosclerotic heart disease of native coronary artery without angina pectoris: Secondary | ICD-10-CM

## 2021-05-01 NOTE — Telephone Encounter (Signed)
Patient had OV and stated that she is having difficulty filling Orencia. She was unable to clarify if it was pharmacy or copay card company. ? ?Sports administrator to clarify and troubleshoot. ? ?Phone: [phone] ? ?She has a Surveyor, quantity and has to call Orencia On Call copay card to get debit card to help with copay. The company will email a virtual debit card to her. ? ?Copay card company phone: [phone] ? ?I called patient and provided her with the phone number to get virtual debit card. She will plan to call today and follow-up with clinic if she has any other issues ? ?Knox Saliva, PharmD, MPH, BCPS, CPP ?Clinical Pharmacist (Rheumatology and Pulmonology) ?

## 2021-05-01 NOTE — Patient Instructions (Signed)
Standing Labs ?We placed an order today for your standing lab work.  ? ?Please have your standing labs drawn in August and every 3 months ?If possible, please have your labs drawn 2 weeks prior to your appointment so that the provider can discuss your results at your appointment. ? ?Please note that you may see your imaging and lab results in [facility] before we have reviewed them. ?We may be awaiting multiple results to interpret others before contacting you. ?Please allow our office up to 72 hours to thoroughly review all of the results before contacting the office for clarification of your results. ? ?We have open lab daily: ?Monday through Thursday from 1:30-4:30 PM and Friday from 1:30-4:00 PM ?at the office of Dr. Bo Merino, [facility].   ?Please be advised, all patients with office appointments requiring lab work will take precedent over walk-in lab work.  ?If possible, please come for your lab work on Monday and Friday afternoons, as you may experience shorter wait times. ?The office is located at [address] [place], [place], [place] [address] ?No appointment is necessary.   ?Labs are drawn by [facility]. Please bring your co-pay at the time of your lab draw.  You may receive a bill from [facility] for your lab work. ? ?Please note if you are on Hydroxychloroquine and and an order has been placed for a Hydroxychloroquine level, you will need to have it drawn 4 hours or more after your last dose. ? ?If you wish to have your labs drawn at another location, please call the office 24 hours in advance to send orders. ? ?If you have any questions regarding directions or hours of operation,  ?please call [phone].   ?As a reminder, please drink plenty of water prior to coming for your lab work. Thanks!  ? ?If you have signs or symptoms of an infection or start antibiotics: ?First, call your PCP for workup of your infection. ?Hold your medication through the infection, until you complete your  antibiotics, and until symptoms resolve if you take the following: ?Injectable medication (Actemra, Benlysta, Cimzia, Cosentyx, Enbrel, Humira, Kevzara, Orencia, Remicade, Simponi, [place], [place], Success) ?Methotrexate ?Leflunomide Jolee Ewing) ?Mycophenolate (Cellcept) ?Roma Kayser, or Rinvoq  ? ?Vaccines ?You are taking a medication(s) that can suppress your immune system.  The following immunizations are recommended: ?Flu annually ?Covid-19  ?Td/Tdap (tetanus, diphtheria, pertussis) every 10 years ?Pneumonia (Prevnar 15 then Pneumovax 23 at least 1 year apart.  Alternatively, can take Prevnar 20 without needing additional dose) ?Shingrix: 2 doses from 4 weeks to 6 months apart ? ?Please check with your PCP to make sure you are up to date.  ?Heart Disease Prevention  ? ?Your inflammatory disease increases your risk of heart disease which includes heart attack, stroke, atrial fibrillation (irregular heartbeats), high blood pressure, heart failure and atherosclerosis (plaque in the arteries).  It is important to reduce your risk by:  ? ?Keep blood pressure, cholesterol, and blood sugar at healthy levels  ? ?Smoking Cessation  ? ?Maintain a healthy weight  ?BMI 20-25  ? ?Eat a healthy diet  ?Plenty of fresh fruit, vegetables, and whole grains  ?Limit saturated fats, foods high in sodium, and added sugars  ?DASH and Mediterranean diet  ? ?Increase physical activity  ?Recommend moderate physically activity for 150 minutes per week/ 30 minutes a day for five days a week These can be broken up into three separate ten-minute sessions during the day.  ? ?Reduce Stress  ?Meditation, slow breathing  exercises, yoga, coloring books  ?Dental visits twice a year   ?

## 2021-05-02 LAB — COMPLETE METABOLIC PANEL WITH GFR
AG Ratio: 0.9 (calc) — ABNORMAL LOW (ref 1.0–2.5)
ALT: 16 U/L (ref 6–29)
AST: 16 U/L (ref 10–35)
Albumin: 4.2 g/dL (ref 3.6–5.1)
Alkaline phosphatase (APISO): 65 U/L (ref 37–153)
BUN: 11 mg/dL (ref 7–25)
CO2: 29 mmol/L (ref 20–32)
Calcium: 10.2 mg/dL (ref 8.6–10.4)
Chloride: 103 mmol/L (ref 98–110)
Creat: 0.78 mg/dL (ref 0.50–1.03)
Globulin: 4.9 g/dL (calc) — ABNORMAL HIGH (ref 1.9–3.7)
Glucose, Bld: 75 mg/dL (ref 65–99)
Potassium: 4.6 mmol/L (ref 3.5–5.3)
Sodium: 139 mmol/L (ref 135–146)
Total Bilirubin: 0.6 mg/dL (ref 0.2–1.2)
Total Protein: 9.1 g/dL — ABNORMAL HIGH (ref 6.1–8.1)
eGFR: 87 mL/min/{1.73_m2} (ref >=60)

## 2021-05-02 LAB — CBC WITH DIFFERENTIAL/PLATELET
Absolute Monocytes: 323 cells/uL (ref 200–950)
Basophils Absolute: 20 cells/uL (ref 0–200)
Basophils Relative: 0.4 %
Eosinophils Absolute: 123 cells/uL (ref 15–500)
Eosinophils Relative: 2.5 %
HCT: 45 % (ref 35.0–45.0)
Hemoglobin: 14.6 g/dL (ref 11.7–15.5)
Lymphs Abs: 2862 cells/uL (ref 850–3900)
MCH: 27.9 pg (ref 27.0–33.0)
MCHC: 32.4 g/dL (ref 32.0–36.0)
MCV: 86 fL (ref 80.0–100.0)
MPV: 11 fL (ref 7.5–12.5)
Monocytes Relative: 6.6 %
Neutro Abs: 1573 cells/uL (ref 1500–7800)
Neutrophils Relative %: 32.1 %
Platelets: 276 10*3/uL (ref 140–400)
RBC: 5.23 10*6/uL — ABNORMAL HIGH (ref 3.80–5.10)
RDW: 12.3 % (ref 11.0–15.0)
Total Lymphocyte: 58.4 %
WBC: 4.9 10*3/uL (ref 3.8–10.8)

## 2021-05-03 ENCOUNTER — Encounter: Payer: Self-pay | Admitting: Podiatry

## 2021-05-03 ENCOUNTER — Ambulatory Visit (INDEPENDENT_AMBULATORY_CARE_PROVIDER_SITE_OTHER): Payer: 59 | Admitting: Podiatry

## 2021-05-03 DIAGNOSIS — M7751 Other enthesopathy of right foot: Secondary | ICD-10-CM

## 2021-05-03 DIAGNOSIS — G629 Polyneuropathy, unspecified: Secondary | ICD-10-CM

## 2021-05-03 DIAGNOSIS — M722 Plantar fascial fibromatosis: Secondary | ICD-10-CM | POA: Diagnosis not present

## 2021-05-03 DIAGNOSIS — M7752 Other enthesopathy of left foot: Secondary | ICD-10-CM

## 2021-05-03 NOTE — Progress Notes (Signed)
Subjective:  ? ?Patient ID: Amber [name], female   DOB: 61 y.o.   MRN: [id number]  ? ?HPI ?Patient states that she is improving quite a bit with minimal discomfort noted currently ? ? ?ROS ? ? ?   ?Objective:  ?Physical Exam  ?Neurovascular status intact with significant reduction of discomfort sinus tarsi bilateral and the plantar fascial band right with pain still noted upon deep palpation but significantly better than previously ? ?   ?Assessment:  ?Improvement of capsulitis fasciitis with medication ? ?   ?Plan:  ?Reviewed condition recommended continuation of conservative care anti-inflammatories and supportive shoes.  May require orthotics if symptoms were to come back again ?   ? ? ?

## 2021-05-08 ENCOUNTER — Ambulatory Visit: Payer: 59 | Admitting: Family

## 2021-05-08 VITALS — BP 124/78 | HR 69 | Temp 98.7°F | Resp 16 | Wt 204.0 lb

## 2021-05-08 DIAGNOSIS — D508 Other iron deficiency anemias: Secondary | ICD-10-CM | POA: Diagnosis not present

## 2021-05-08 DIAGNOSIS — J45909 Unspecified asthma, uncomplicated: Secondary | ICD-10-CM

## 2021-05-08 DIAGNOSIS — M0579 Rheumatoid arthritis with rheumatoid factor of multiple sites without organ or systems involvement: Secondary | ICD-10-CM

## 2021-05-08 DIAGNOSIS — E785 Hyperlipidemia, unspecified: Secondary | ICD-10-CM

## 2021-05-08 DIAGNOSIS — I10 Essential (primary) hypertension: Secondary | ICD-10-CM

## 2021-05-08 DIAGNOSIS — E119 Type 2 diabetes mellitus without complications: Secondary | ICD-10-CM

## 2021-05-08 DIAGNOSIS — E538 Deficiency of other specified B group vitamins: Secondary | ICD-10-CM

## 2021-05-08 DIAGNOSIS — G43009 Migraine without aura, not intractable, without status migrainosus: Secondary | ICD-10-CM

## 2021-05-08 LAB — HEMOGLOBIN A1C: Hgb A1c MFr Bld: 6.1 % (ref 4.6–6.5)

## 2021-05-08 NOTE — Assessment & Plan Note (Addendum)
Stable. Management per rheumatology. States that they are working on getting coverage for Orencia through her insurance.  ?

## 2021-05-08 NOTE — Assessment & Plan Note (Deleted)
BP Readings from Last 3 Encounters:  ?05/08/21 (!) 142/77  ?05/01/21 134/88  ?02/13/21 (!) 152/94  ? ?Fair bp. Continues valsartan, toprol xl and hctz.  ?

## 2021-05-08 NOTE — Telephone Encounter (Signed)
Spoke with patient regarding update. She states she conference calls with copay card company and [employer]. Copay card company said they would not pay towards maximizer plan anymore. A copay card rep did state they were going to send an application that would maybe allow patient to fill lesser quantity.  She does not recall exactly what the program was or what the rep said.  She states she will clinic back with information since she took notes on a sheet of paper that is at home right now. ? ?Knox Saliva, PharmD, MPH, BCPS, CPP ?Clinical Pharmacist (Rheumatology and Pulmonology) ?

## 2021-05-08 NOTE — Assessment & Plan Note (Signed)
Lab Results  ?Component Value Date  ? CHOL 161 05/03/2020  ? HDL 56.30 05/03/2020  ? [place] 91 05/03/2020  ? TRIG 68.0 05/03/2020  ? CHOLHDL 3 05/03/2020  ? ?Continue crestor. Stable.  ?

## 2021-05-08 NOTE — Assessment & Plan Note (Signed)
Stable with prn albuterol.  
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
## 2021-05-08 NOTE — Assessment & Plan Note (Signed)
. ?  BP Readings from Last 3 Encounters:  ?05/08/21 (!) 142/77  ?05/01/21 134/88  ?02/13/21 (!) 152/94  ? ?Fair bp. Continues valsartan, toprol xl and hctz.  ?

## 2021-05-08 NOTE — Assessment & Plan Note (Signed)
Had migraine 2 weeks ago. Uses Imitrex prn which is helpful.  ?

## 2021-05-08 NOTE — Assessment & Plan Note (Signed)
Lab Results  ?Component Value Date  ? WBC 4.9 05/01/2021  ? HGB 14.6 05/01/2021  ? HCT 45.0 05/01/2021  ? MCV 86.0 05/01/2021  ? PLT 276 05/01/2021  ? ?Stable H/H. Monitor.  ?

## 2021-05-08 NOTE — Assessment & Plan Note (Signed)
Continues b12 oral supplement. Check follow up b12 level.  ?

## 2021-05-09 ENCOUNTER — Telehealth: Payer: Self-pay | Admitting: Family

## 2021-05-09 LAB — VITAMIN B12: Vitamin B-12: 256 pg/mL (ref 211–911)

## 2021-05-09 NOTE — Telephone Encounter (Signed)
B12 level is low. I recommend that she stop oral b12 and begin b12 injection 1091mg IM weekly for 1 month then once monthly.  Sugar is stable. ?

## 2021-05-09 NOTE — Telephone Encounter (Signed)
Patient advised of results and scheduled to come in for B12 shot 1 of 4 weekly. ?

## 2021-05-12 ENCOUNTER — Ambulatory Visit (INDEPENDENT_AMBULATORY_CARE_PROVIDER_SITE_OTHER): Payer: 59

## 2021-05-12 DIAGNOSIS — E538 Deficiency of other specified B group vitamins: Secondary | ICD-10-CM

## 2021-05-12 MED ORDER — CYANOCOBALAMIN 1000 MCG/ML IJ SOLN
1000.0000 ug | Freq: Once | INTRAMUSCULAR | Status: AC
  Administered 2021-05-12: 1000 ug via INTRAMUSCULAR

## 2021-05-12 NOTE — Progress Notes (Signed)
Amber [name] is a 61 y.o. female presents to the office today for 1/4 Weekly B12 injection per physician's orders. ?Original order: 05/09/21- "B12 level is low. I recommend that she stop oral b12 and begin b12 injection 1071mg IM weekly for 1 month then once monthly.  Sugar is stable." ?Cyanocoblamin 1000 mcg IM, was administered L deltoid today. Patient tolerated injection. ?Patient due for follow up labs/provider appt: No.  ?Patient next injection due: 1 week, appt made Yes ? ?[place], TKristine GarbeL ? ? ? ? ? ?

## 2021-05-19 ENCOUNTER — Ambulatory Visit (INDEPENDENT_AMBULATORY_CARE_PROVIDER_SITE_OTHER): Payer: 59

## 2021-05-19 DIAGNOSIS — E538 Deficiency of other specified B group vitamins: Secondary | ICD-10-CM

## 2021-05-19 MED ORDER — CYANOCOBALAMIN 1000 MCG/ML IJ SOLN
1000.0000 ug | Freq: Once | INTRAMUSCULAR | Status: AC
  Administered 2021-05-19: 1000 ug via INTRAMUSCULAR

## 2021-05-19 NOTE — Progress Notes (Signed)
MACIAH FEEBACK is a 61 y.o. female presents to the office today for 2/4 Weekly B12 injection per physician's orders. Original order: 05/09/21- "B12 level is low. I recommend that she stop oral b12 and begin b12 injection 1044mg IM weekly for 1 month then once monthly. Sugar is stable." Cyanocoblamin 1000 mcg IM, was administered R deltoid today. Patient tolerated injection.

## 2021-05-26 ENCOUNTER — Ambulatory Visit: Payer: 59

## 2021-05-30 ENCOUNTER — Ambulatory Visit (INDEPENDENT_AMBULATORY_CARE_PROVIDER_SITE_OTHER): Payer: 59

## 2021-05-30 DIAGNOSIS — E538 Deficiency of other specified B group vitamins: Secondary | ICD-10-CM | POA: Diagnosis not present

## 2021-05-30 MED ORDER — CYANOCOBALAMIN 1000 MCG/ML IJ SOLN
1000.0000 ug | Freq: Once | INTRAMUSCULAR | Status: AC
  Administered 2021-05-30: 1000 ug via INTRAMUSCULAR

## 2021-05-30 NOTE — Progress Notes (Signed)
Amber [name] is a 61 y.o. female presents to the office today for 2/4 Weekly B12 injection per physician's orders. Original order: 05/09/21- "B12 level is low. I recommend that she stop oral b12 and begin b12 injection 1068mg IM weekly for 1 month then once monthly. Sugar is stable." Cyanocoblamin 1000 mcg IM, was administered R deltoid today. Patient tolerated injection.

## 2021-05-30 NOTE — Progress Notes (Unsigned)
   CC:  headaches  Follow-up Visit  Last visit: 02/13/21  Brief HPI: 61 year old female with a history of RA, Sjogren's syndrome, glaucoma, and prediabetes who follows in clinic for migraines.  At her last visit she was started on Emgality for headache prevention. Imitrex was increased to 100 mg PRN for rescue.  Interval History: She has had improvement in her headaches since starting Emgality. She has had 3 injections so far. Has only had to use Imitrex twice since her last visit. This works well for rescue and does not cause side effects. Continues to have intermittent shooting pains in her left occiput radiating forward which occur ~2 times per week. They last for seconds at a time.    Headache days per month: 2 Headache free days per month: 28  Current Headache Regimen: Preventative: Emgality 120 mg monthly Abortive: Imitrex 100 mg PRN  Prior Therapies                                  Topamax 50 mg QHS - drowsiness Metoprolol Valsartan Lisinopril Emgality 120 mg monthly Imitrex 100 mg PRN Flexeril 5 mg PRN - drowsiness  Physical Exam:   Vital Signs: LMP 08/09/2016  GENERAL:  well appearing, in no acute distress, alert  SKIN:  Color, texture, turgor normal. No rashes or lesions HEAD:  Normocephalic/atraumatic. RESP: normal respiratory effort MSK:  +tenderness to palpation over left occiput  NEUROLOGICAL: Mental Status: Alert, oriented to person, place and time, Follows commands, and Speech fluent and appropriate. Cranial Nerves: PERRL, face symmetric, no dysarthria, hearing grossly intact Motor: moves all extremities equally Gait: normal-based.  IMPRESSION: 61 year old female with a history of RA, Sjogren's syndrome, glaucoma, and prediabetes who presents for follow up of migraines. She has had significant improvement in migraine frequency on Emgality. Imitrex works well for rescue. 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 Discussed treatment options  including physical therapy and PRN muscle relaxer. She would prefer to start a muscle relaxer at this time. Previously tolerated Flexeril 2.5 mg PRN (higher doses made her drowsy), will start this as needed for muscle spasms.  PLAN: -Prevention: continue Emgality 120 mg monthly -Rescue: continue Imitrex 100 mg PRN -Start Flexeril 2.5 mg PRN for neck pain/tension -next steps: consider neck PT, nerve block   Follow-up: 1 year or sooner if needed  I spent a total of 15 minutes on the date of the service. Headache education was done. Discussed treatment options including preventive and acute medications, and physical therapy. Discussed medication side effects, adverse reactions and drug interactions. Written educational materials and patient instructions outlining all of the above were given.  Genia Harold, MD 05/31/21 11:24 AM

## 2021-05-31 ENCOUNTER — Ambulatory Visit: Payer: 59 | Admitting: Psychiatry

## 2021-05-31 VITALS — BP 154/83 | HR 63 | Ht 61.0 in | Wt 208.0 lb

## 2021-05-31 DIAGNOSIS — G43019 Migraine without aura, intractable, without status migrainosus: Secondary | ICD-10-CM | POA: Diagnosis not present

## 2021-05-31 MED ORDER — SUMATRIPTAN SUCCINATE 100 MG PO TABS: 100.0000 mg | ORAL_TABLET | ORAL | 11 refills | Status: DC | PRN

## 2021-05-31 MED ORDER — EMGALITY 120 MG/ML ~~LOC~~ SOAJ
120.0000 mg | SUBCUTANEOUS | 11 refills | Status: DC
Start: 2021-05-31 — End: 2022-07-17

## 2021-05-31 MED ORDER — CYCLOBENZAPRINE HCL 5 MG PO TABS: 2.5000 mg | ORAL_TABLET | ORAL | 5 refills | Status: DC | PRN

## 2021-06-06 ENCOUNTER — Ambulatory Visit (INDEPENDENT_AMBULATORY_CARE_PROVIDER_SITE_OTHER): Payer: 59

## 2021-06-06 DIAGNOSIS — E538 Deficiency of other specified B group vitamins: Secondary | ICD-10-CM

## 2021-06-06 MED ORDER — CYANOCOBALAMIN 1000 MCG/ML IJ SOLN
1000.0000 ug | Freq: Once | INTRAMUSCULAR | Status: AC
  Administered 2021-06-06: 1000 ug via INTRAMUSCULAR

## 2021-06-06 NOTE — Telephone Encounter (Signed)
Patient states copay card was unable to provide additional funds and she became tired of being transferred. Per pharmacy, she last filled [place] 03/13/21.  We will try to apply for [employer]cia BMS patient assistance. She will plan to bring income documents tomorrow morning. Will prep BMS application  Knox Saliva, PharmD, MPH, BCPS, CPP Clinical Pharmacist (Rheumatology and Pulmonology)

## 2021-06-06 NOTE — Progress Notes (Signed)
AIDE WOJNAR is a 61 y.o. female presents to the office today for 4 of 4  Weekly B12 injection per physician's orders. Original order: 05/09/21- "B12 level is low. I recommend that she stop oral b12 and begin b12 injection 105mg IM weekly for 1 month then once monthly. Sugar is stable." Cyanocoblamin 1000 mcg IM, was administered R deltoid today. Patient tolerated injection.

## 2021-06-08 ENCOUNTER — Telehealth: Payer: Self-pay | Admitting: Pharmacist

## 2021-06-08 ENCOUNTER — Encounter: Payer: Self-pay | Admitting: Oncology

## 2021-06-08 ENCOUNTER — Other Ambulatory Visit (HOSPITAL_COMMUNITY): Payer: Self-pay

## 2021-06-08 NOTE — Telephone Encounter (Signed)
Medication Samples have been provided to the patient.  Drug name: Orencia Clickject '125mg'$ /mL Qty: 2 pens LOT: [id number]  Exp.Date: 05/2022 LOT: [id number]  Exp.Date: 03/2022  Knox Saliva, PharmD, MPH, BCPS, CPP Clinical Pharmacist (Rheumatology and Pulmonology)

## 2021-06-08 NOTE — Telephone Encounter (Signed)
Patient dropped off income documents for Orencia BMS patient assistance application.  Per cart review, patient may have new insurance through [employer] but unable to run prior auth on CMM. The new insurance (uploaded 05/03/21) does not populate in eligibility search either. Per rep, we can try to run a tier exception request to get patient's medication to process.  Rep able to successfully run claim for $1250 per 28 days.  Phone: [phone]  BIN: [id number] PCN: [facility]ula Flax GroupCindra Eves ID: [id number]  Submitted Patient Assistance Application to [place] for [facility] along with provider portion, patient portion, med list, and insurance card copy, and income documents. Will update patient when we receive a response.  Fax# [phone] Phone# [phone]  Knox Saliva, PharmD, MPH, BCPS, CPP Clinical Pharmacist (Rheumatology and Pulmonology)

## 2021-06-12 NOTE — Telephone Encounter (Signed)
Received fax from [place] requesting clear fax of patient's insurance information. Faxed to BMS  .Knox Saliva, PharmD, MPH, BCPS, CPP Clinical Pharmacist (Rheumatology and Pulmonology)

## 2021-06-19 NOTE — Telephone Encounter (Signed)
Call BMS regarding status of patient assistance application. Per rep, second fax with insurance information was received and she will forward it to their processing team. Expected turnaround time is 24-48 hours. Will follow up on Thursday if we do not hear back sooner.  Rodolph Bong, PharmD Candidate 06/19/2021 10:28 AM

## 2021-06-21 HISTORY — PX: ROTATOR CUFF REPAIR: SHX139

## 2021-06-22 ENCOUNTER — Other Ambulatory Visit (HOSPITAL_COMMUNITY): Payer: Self-pay

## 2021-06-22 NOTE — Telephone Encounter (Signed)
Called to update Ms. Lovings that patient assistance has been denied. Tried to contact copay card company [phone]) to determine if funds have been maxed out but would not provide information without Ms. Sills's consent. Conference called with Ms. Mcclarty and [place] card company [phone]). Per copay card company, how the card is being charged was changed in May. She was switched to [employer] program where they only pay $25 and leaves her with the remainder copay. We were then transferred to CART services [phone]) and spoke with [facility] who said to contact patients PBM and request the PBM cover the remainder of the copay. If the PBM denies covering the additional cost, then Ms. Coopersmith can reach back out to CART services, answer 4 questions, and potentially be taken out of maximzer plan. Provided patient with [facility] phone # [phone] option 1).  Joseph Art, Pharm.D. PGY-1 Pharmacy Resident 06/22/2021 10:34 AM

## 2021-06-22 NOTE — Telephone Encounter (Addendum)
Received fax from [place] for [facility] patient assistance, patient's application has been DENIED due to exceeding financial income criteria as well as having insurance coverage for medication. Financial income threshold is [phone] for household of one. Per rep, patient can appeal if copay cards have been depleted. Appeal includes financial hardship letter stating that she is unable to afford medication and has maxed out copay card.   Phone # [phone]  Knox Saliva, PharmD, MPH, BCPS, CPP Clinical Pharmacist (Rheumatology and Pulmonology)

## 2021-06-26 ENCOUNTER — Telehealth: Payer: Self-pay | Admitting: Rheumatology

## 2021-06-26 NOTE — Telephone Encounter (Signed)
Called to follow up on a voicemail from Ms. Haycraft. She contacted her PBM who told her they would not cover the remainder of the copay. She then reached out to CART services and they were unable to pay or take her out of the maximizer plan and did not give her a reason for why they could not do it for her. At this point we will be moving forward with an appeal for patient assistance. Patient will reach out to insurance to determine her out of pocket medical and prescription drug costs and will call us back once she has that number so we can move forward with the appeal.  Sherald Hess, PharmD Candidate 06/26/2021 9:09 AM

## 2021-06-26 NOTE — Telephone Encounter (Signed)
error 

## 2021-06-28 NOTE — Telephone Encounter (Signed)
Faxed BMS appeal for Orencia PAP reconsideration.  Fax: [phone] Phone: [phone] Case # [id number]  Knox Saliva, PharmD, MPH, BCPS, CPP Clinical Pharmacist (Rheumatology and Pulmonology)

## 2021-07-07 ENCOUNTER — Ambulatory Visit (INDEPENDENT_AMBULATORY_CARE_PROVIDER_SITE_OTHER): Payer: 59

## 2021-07-07 DIAGNOSIS — E538 Deficiency of other specified B group vitamins: Secondary | ICD-10-CM | POA: Diagnosis not present

## 2021-07-07 MED ORDER — CYANOCOBALAMIN 1000 MCG/ML IJ SOLN
1000.0000 ug | Freq: Once | INTRAMUSCULAR | Status: AC
  Administered 2021-07-07: 1000 ug via INTRAMUSCULAR

## 2021-07-07 NOTE — Progress Notes (Signed)
Amber [name] is a 61 y.o. female  presents to the office today for a B12 injection, per physician's orders. Original order: per [name], NP  cyanocobalamin, 1000 mg/ml IM was administered in R deltoid today (pt's L arm in sling). This is the first of her monthly B12 injections.  Patient tolerated injection well.   Next appointment:  08/11/21

## 2021-07-11 NOTE — Telephone Encounter (Addendum)
Called BMS for update on patient's Orencia application appeal. Per rep, appeal was not submitted and was just sitting in file. She has moved forwarded for consideration and processing. No expected turnaround time - rep suggests calling back on Thursday  Phone: [phone], option 2, option 3  Knox Saliva, PharmD, MPH, BCPS, CPP Clinical Pharmacist (Rheumatology and Pulmonology)

## 2021-07-12 ENCOUNTER — Telehealth: Payer: Self-pay | Admitting: Rheumatology

## 2021-07-12 NOTE — Telephone Encounter (Signed)
Received a fax from  [place] regarding an approval for [facility] patient assistance from 07/12/21 to 07/12/22.   Phone number: [phone]   ATC patient to review but phone was answered with no response. Will ATC tomorrow  Knox Saliva, PharmD, MPH, BCPS, CPP Clinical Pharmacist (Rheumatology and Pulmonology)

## 2021-07-12 NOTE — Telephone Encounter (Signed)
[facility] left a voicemail stating patient is approved for Orencia Pen 07/11/2021 to 07/12/2022.   Phone [phone]

## 2021-07-13 NOTE — Telephone Encounter (Signed)
Patient called stating she received a call from BMS regarding pt assistance approval.   She also received a call from her HR department stating they found a way to make her copay about $120 per month and was willing to pay this if it was the only option ultimately. I discussed that she is approved for pt assistance and she would receive for free. She states she will use patient assistance program. Pt expressed gratitude to pharmacy team for their hard work.  Per patient, they will be delivering [place] tomorrow, 07/14/21. She is aware that she is due for labs on 08/01/21.  Knox Saliva, PharmD, MPH, BCPS, CPP Clinical Pharmacist (Rheumatology and Pulmonology)

## 2021-08-03 ENCOUNTER — Encounter: Payer: Self-pay | Admitting: General Practice

## 2021-08-07 ENCOUNTER — Telehealth: Payer: Self-pay | Admitting: Family

## 2021-08-07 ENCOUNTER — Ambulatory Visit: Payer: 59 | Admitting: Family

## 2021-08-07 VITALS — BP 153/73 | HR 71 | Temp 98.5°F | Resp 16 | Wt 211.0 lb

## 2021-08-07 DIAGNOSIS — E119 Type 2 diabetes mellitus without complications: Secondary | ICD-10-CM

## 2021-08-07 DIAGNOSIS — E538 Deficiency of other specified B group vitamins: Secondary | ICD-10-CM

## 2021-08-07 DIAGNOSIS — I10 Essential (primary) hypertension: Secondary | ICD-10-CM

## 2021-08-07 DIAGNOSIS — D509 Iron deficiency anemia, unspecified: Secondary | ICD-10-CM

## 2021-08-07 DIAGNOSIS — I5032 Chronic diastolic (congestive) heart failure: Secondary | ICD-10-CM | POA: Diagnosis not present

## 2021-08-07 DIAGNOSIS — G43009 Migraine without aura, not intractable, without status migrainosus: Secondary | ICD-10-CM | POA: Diagnosis not present

## 2021-08-07 DIAGNOSIS — R21 Rash and other nonspecific skin eruption: Secondary | ICD-10-CM | POA: Insufficient documentation

## 2021-08-07 DIAGNOSIS — B977 Papillomavirus as the cause of diseases classified elsewhere: Secondary | ICD-10-CM

## 2021-08-07 DIAGNOSIS — M0579 Rheumatoid arthritis with rheumatoid factor of multiple sites without organ or systems involvement: Secondary | ICD-10-CM

## 2021-08-07 LAB — CBC WITH DIFFERENTIAL/PLATELET
Basophils Absolute: 0 10*3/uL (ref 0.0–0.1)
Basophils Relative: 0.3 % (ref 0.0–3.0)
Eosinophils Absolute: 0 10*3/uL (ref 0.0–0.7)
Eosinophils Relative: 0 % (ref 0.0–5.0)
HCT: 39.5 % (ref 36.0–46.0)
Hemoglobin: 12.8 g/dL (ref 12.0–15.0)
Lymphocytes Relative: 29.6 % (ref 12.0–46.0)
Lymphs Abs: 1.6 10*3/uL (ref 0.7–4.0)
MCHC: 32.4 g/dL (ref 30.0–36.0)
MCV: 85.5 fl (ref 78.0–100.0)
Monocytes Absolute: 0.3 10*3/uL (ref 0.1–1.0)
Monocytes Relative: 4.9 % (ref 3.0–12.0)
Neutro Abs: 3.6 10*3/uL (ref 1.4–7.7)
Neutrophils Relative %: 65.2 % (ref 43.0–77.0)
Platelets: 250 10*3/uL (ref 150.0–400.0)
RBC: 4.62 Mil/uL (ref 3.87–5.11)
RDW: 13.8 % (ref 11.5–15.5)
WBC: 5.5 10*3/uL (ref 4.0–10.5)

## 2021-08-07 LAB — FERRITIN: Ferritin: 35.7 ng/mL (ref 10.0–291.0)

## 2021-08-07 LAB — HEMOGLOBIN A1C: Hgb A1c MFr Bld: 6.5 % (ref 4.6–6.5)

## 2021-08-07 MED ORDER — CYANOCOBALAMIN 1000 MCG/ML IJ SOLN
1000.0000 ug | Freq: Once | INTRAMUSCULAR | Status: AC
  Administered 2021-08-07: 1000 ug via INTRAMUSCULAR

## 2021-08-07 MED ORDER — METOPROLOL SUCCINATE ER 100 MG PO TB24: 100.0000 mg | ORAL_TABLET | Freq: Every day | ORAL | 1 refills | Status: DC

## 2021-08-07 MED ORDER — NYSTATIN 100000 UNIT/GM EX CREA: 1.0000 | TOPICAL_CREAM | Freq: Two times a day (BID) | CUTANEOUS | 0 refills | Status: DC

## 2021-08-07 NOTE — Assessment & Plan Note (Signed)
Stable. Keeps imitrex on hand.

## 2021-08-07 NOTE — Assessment & Plan Note (Signed)
Follows by rheumatology.  Maintained on orencia.

## 2021-08-07 NOTE — Assessment & Plan Note (Signed)
On iron '325mg'$  every other day.  Lab Results  Component Value Date   WBC 4.9 05/01/2021   HGB 14.6 05/01/2021   HCT 45.0 05/01/2021   MCV 86.0 05/01/2021   PLT 276 05/01/2021

## 2021-08-07 NOTE — Assessment & Plan Note (Signed)
Needs repeat pap after 10/7.

## 2021-08-07 NOTE — Assessment & Plan Note (Signed)
Wt Readings from Last 3 Encounters:  08/07/21 211 lb (95.7 kg)  05/31/21 208 lb (94.3 kg)  05/08/21 204 lb (92.5 kg)   Weight stable overall.

## 2021-08-07 NOTE — Progress Notes (Addendum)
Subjective:   By signing my name below, I, Amber [name], attest that this documentation has been prepared under the direction and in the presence of Amber Alar, NP 08/07/2021    Patient ID: Amber [name], female    DOB: September 17, 1960, 61 y.o.   MRN: [id number]  Chief Complaint  Patient presents with   Diabetes    Here for follow up    HPI Patient is in today for a follow up visit.   Left shoulder pain- She complains of left shoulder pain since her shoulder procedure on June 24, 2021. She also recently developed swelling on her left shoulder as well. She is following up with her surgeon and was told to monitor her swelling for the next couple of weeks before they decide on treatment.   Rash- She complains of a rash on her left arm from wearing a shoulder sling following her recent shoulder procedure. She is interested in medication to help manage it.   Blood sugar- She does not check her blood sugar at home regularly. She continues taking farxiga and reports no new issues while taking it.   Migraines- Her migraines are stable and she reports not having one recently. She has 100 mg imitrex in case her symptoms worsen. She continues taking Emaglity injections and reports no new issues while taking it.   B12- She continues receiving vitamin B12 injections regularly. She reports she is supposed to receive one today.   Iron- She continues taking iron supplements and reports having mild constipation while taking it.    Health Maintenance Due  Topic Date Due   COVID-19 Vaccine (4 - Booster for Moderna series) 01/22/2020   INFLUENZA VACCINE  08/01/2021    Past Medical History:  Diagnosis Date   Anemia    Arthritis    Asthma 05/03/2020   Blood transfusion without reported diagnosis    CAD (coronary artery disease) 04/24/2017   Cerebrovascular disease 10/13/2020   Chronic diastolic CHF (congestive heart failure) ([place]) 04/24/2017   Colon polyps    Controlled type 2  diabetes mellitus without complication, without long-term current use of insulin ([place]) 05/31/2017   Diabetic peripheral neuropathy ([place]) 01/05/2020   Ectopic pregnancy    RIGHT salpingostomy   Elective abortion    Elective abortion 09/26/2020   RIGHT salpingostomy   Essential hypertension 09/04/2017   GERD (gastroesophageal reflux disease)    Glaucoma    both eyes   Headache    migraines   History of chicken pox    History of chicken pox 09/26/2020   migraines   Hyperlipemia    Hypertension    Iron deficiency anemia 09/02/2013   Migraine 04/19/2014   Obesity (BMI 30-39.9) 10/17/2012   Palpitations 10/01/2012   Rheumatoid arthritis ([place]) 11/07/2011   Rheumatoid arthritis(714.0) 11/07/2011   SAB (spontaneous abortion)    SAB (spontaneous abortion) 11/07/2011   Shortness of breath    Sjogren's disease ([place])    Wheezing     Past Surgical History:  Procedure Laterality Date   ABDOMINAL SURGERY     bowel repair   COLONOSCOPY     DILATION AND CURETTAGE OF UTERUS     X 2   MENISCUS REPAIR Right    MENISCUS TEAR REPAIR   MYOMECTOMY N/A 07/27/2014   Procedure: ABDOMINAL MYOMECTOMY;  Surgeon: Waymon Amato, MD;  Location: [facility];  Service: Gynecology;  Laterality: N/A;   PELVIC LAPAROSCOPY  1994   IN 1999 LAPAROSCOPY WITH INCIDENTAL ENTEROTOMY REQUIRING LAPAROTOMY   UTERINE  FIBROID SURGERY     July 2016    Family History  Problem Relation Age of Onset   Hypertension Mother        died from heart disease   Heart disease Mother        deceased, unknown cause   Stroke Mother    Obesity Mother    Diabetes Sister    Hypertension Sister    Esophageal cancer Sister    Stomach cancer Sister    Cancer Sister    Diabetes Sister    Rectal cancer Neg Hx    Colon cancer Neg Hx     Social History   Socioeconomic History   Marital status: Single    Spouse name: Not on file   Number of children: 0   Years of education: college   Highest education level: Not on file  Occupational History    Occupation: LPN    Employer: [employer]  Tobacco Use   Smoking status: Never    Passive exposure: Never   Smokeless tobacco: Never  Vaping Use   Vaping Use: Never used  Substance and Sexual Activity   Alcohol use: No    Alcohol/week: 0.0 standard drinks of alcohol   Drug use: No   Sexual activity: Yes    Birth control/protection: None    Comment: 1st intercourse- 21, partners- 52  Other Topics Concern   Not on file  Social History Narrative   Regular exercise:  No   Caffeine Use: 2 cups coffee daily   No children   "Happily divorced"   Reports that she is involved at her church   Works as an Therapist, 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  Had a sister up in [place] who passed away 21-Feb-2015 who she was close to.   Born in [place]- she came to [place] as adult.  Age 41.    Left-handed.         Social Determinants of Health   Financial Resource Strain: Not on file  Food Insecurity: Not on file  Transportation Needs: Not on file  Physical Activity: Not on file  Stress: Not on file  Social Connections: Not on file  Intimate Partner Violence: Not on file    Outpatient Medications Prior to Visit  Medication Sig Dispense Refill   Abatacept (ORENCIA CLICKJECT) 412 MG/ML SOAJ Inject 142m into the skin every 10 days.     aspirin EC 81 MG tablet Take 1 tablet (81 mg total) by mouth daily. 90 tablet 3   calcium-vitamin D (OSCAL WITH D) 500-200 MG-UNIT TABS tablet Take by mouth.     Cyanocobalamin (VITAMIN B-12) 5000 MCG LOZG Take 1 tablet by mouth 2 (two) times daily.     cyclobenzaprine (FLEXERIL) 5 MG tablet Take 0.5 tablets (2.5 mg total) by mouth as needed for muscle spasms. 30 tablet 5   dapagliflozin propanediol (FARXIGA) 10 MG TABS tablet Take 1 tablet (10 mg total) by mouth daily before breakfast. 90 tablet 3   Galcanezumab-gnlm (EMGALITY) 120 MG/ML SOAJ Inject 120 mg into the skin every 30 (thirty) days. 1.12 mL 11   hydrochlorothiazide (HYDRODIURIL) 25 MG tablet Take 1 tablet (25 mg total)  by mouth daily. 90 tablet 0   SUMAtriptan (IMITREX) 100 MG tablet Take 1 tablet (100 mg total) by mouth as needed for migraine. May repeat in 2 hours if headache persists or recurs. 10 tablet 11   TRAVATAN Z 0.004 % SOLN ophthalmic solution Place 1 drop into both eyes at bedtime.  valsartan (DIOVAN) 160 MG tablet Take 1 tablet (160 mg total) by mouth daily. 30 tablet 0   metoprolol succinate (TOPROL-XL) 100 MG 24 hr tablet Take 1 tablet (100 mg total) by mouth daily. Take with or immediately following a meal. 90 tablet 0   rosuvastatin (CRESTOR) 10 MG tablet Take 1 tablet (10 mg total) by mouth daily. 90 tablet 3   No facility-administered medications prior to visit.    No Known Allergies  Review of Systems  Musculoskeletal:  Positive for joint pain (left shoulder).       (+)swelling in left shoulder  Skin:  Positive for rash (left arm).       Objective:    Physical Exam Constitutional:      General: She is not in acute distress.    Appearance: Normal appearance. She is not ill-appearing.  HENT:     Head: Normocephalic and atraumatic.     Right Ear: External ear normal.     Left Ear: External ear normal.  Eyes:     Extraocular Movements: Extraocular movements intact.     Pupils: Pupils are equal, round, and reactive to light.  Cardiovascular:     Rate and Rhythm: Normal rate and regular rhythm.     Heart sounds: Normal heart sounds. No murmur heard.    No gallop.  Pulmonary:     Effort: Pulmonary effort is normal. No respiratory distress.     Breath sounds: Normal breath sounds. No wheezing or rales.  Skin:    General: Skin is warm and dry.     Comments: Mild erythema L AC fossa  Neurological:     Mental Status: She is alert and oriented to person, place, and time.  Psychiatric:        Judgment: Judgment normal.     BP (!) 153/73 (BP Location: Right Arm, Patient Position: Sitting, Cuff Size: Large)   Pulse 71   Temp 98.5 F (36.9 C) (Oral)   Resp 16   Wt 211  lb (95.7 kg)   LMP 08/09/2016   SpO2 100%   BMI 39.87 kg/m  Wt Readings from Last 3 Encounters:  08/07/21 211 lb (95.7 kg)  05/31/21 208 lb (94.3 kg)  05/08/21 204 lb (92.5 kg)       Assessment & Plan:   Problem List Items Addressed This Visit       Unprioritized   Skin rash    ? Mild fungal rash L AC fossa. Rx sent for nystatin cream.       Rheumatoid arthritis ([place])    Follows by rheumatology.  Maintained on orencia.        Migraine    Stable. Keeps imitrex on hand.       Relevant Medications   metoprolol succinate (TOPROL-XL) 100 MG 24 hr tablet   Iron deficiency anemia    On iron 362m every other day.  Lab Results  Component Value Date   WBC 4.9 05/01/2021   HGB 14.6 05/01/2021   HCT 45.0 05/01/2021   MCV 86.0 05/01/2021   PLT 276 05/01/2021        Relevant Orders   Iron   Ferritin   CBC with Differential/Platelet   High risk HPV infection    Needs repeat pap after 10/7.       Relevant Medications   nystatin cream (MYCOSTATIN)   Essential hypertension   Relevant Medications   metoprolol succinate (TOPROL-XL) 100 MG 24 hr tablet   Controlled type 2 diabetes mellitus without  complication, without long-term current use of insulin ([place]) - Primary    Lab Results  Component Value Date   HGBA1C 6.1 05/08/2021   HGBA1C 6.2 01/23/2021   HGBA1C 6.3 08/05/2020   Lab Results  Component Value Date   MICROALBUR 3.20 (H) 11/07/2011   LDLCALC 91 05/03/2020   CREATININE 0.78 05/01/2021  Clinically stable on Farxiga. Obtain follow up A1C.       Relevant Orders   Hemoglobin A1c   Comp Met (CMET)   Chronic diastolic CHF (congestive heart failure) (HCC)    Wt Readings from Last 3 Encounters:  08/07/21 211 lb (95.7 kg)  05/31/21 208 lb (94.3 kg)  05/08/21 204 lb (92.5 kg)  Weight stable overall.       Relevant Medications   metoprolol succinate (TOPROL-XL) 100 MG 24 hr tablet   B12 deficiency    Due for b12 shot today.         Meds ordered  this encounter  Medications   nystatin cream (MYCOSTATIN)    Sig: Apply 1 Application topically 2 (two) times daily.    Dispense:  30 g    Refill:  0    Order Specific Question:   Supervising Provider    Answer:   Penni Homans A [[id number]]   metoprolol succinate (TOPROL-XL) 100 MG 24 hr tablet    Sig: Take 1 tablet (100 mg total) by mouth daily. Take with or immediately following a meal.    Dispense:  90 tablet    Refill:  1    Fill / Hold based on patient's refill schedule.    Order Specific Question:   Supervising Provider    Answer:   Penni Homans A [[id number]]   cyanocobalamin (VITAMIN B12) injection 1,000 mcg    I, [name], NP, personally preformed the services described in this documentation.  All medical record entries made by the scribe were at my direction and in my presence.  I have reviewed the chart and discharge instructions (if applicable) and agree that the record reflects my personal performance and is accurate and complete. 08/07/2021   I,Amber [name],acting as a scribe for [name], NP.,have documented all relevant documentation on the behalf of [name], NP,as directed by  [name], NP while in the presence of [name], NP.   [name], NP

## 2021-08-07 NOTE — Assessment & Plan Note (Signed)
?   Mild fungal rash L AC fossa. Rx sent for nystatin cream.

## 2021-08-07 NOTE — Assessment & Plan Note (Signed)
Due for b12 shot today.

## 2021-08-07 NOTE — Telephone Encounter (Signed)
See mychart.  

## 2021-08-07 NOTE — Assessment & Plan Note (Signed)
Lab Results  Component Value Date   HGBA1C 6.1 05/08/2021   HGBA1C 6.2 01/23/2021   HGBA1C 6.3 08/05/2020   Lab Results  Component Value Date   MICROALBUR 3.20 (H) 11/07/2011   LDLCALC 91 05/03/2020   CREATININE 0.78 05/01/2021   Clinically stable on Farxiga. Obtain follow up A1C.

## 2021-08-08 LAB — COMPREHENSIVE METABOLIC PANEL
ALT: 15 U/L (ref 0–35)
AST: 12 U/L (ref 0–37)
Albumin: 4.1 g/dL (ref 3.5–5.2)
Alkaline Phosphatase: 56 U/L (ref 39–117)
BUN: 19 mg/dL (ref 6–23)
CO2: 26 mEq/L (ref 19–32)
Calcium: 10 mg/dL (ref 8.4–10.5)
Chloride: 102 mEq/L (ref 96–112)
Creatinine, Ser: 0.75 mg/dL (ref 0.40–1.20)
GFR: 86.72 mL/min (ref >60.00)
Glucose, Bld: 156 mg/dL — ABNORMAL HIGH (ref 70–99)
Potassium: 4.2 mEq/L (ref 3.5–5.1)
Sodium: 140 mEq/L (ref 135–145)
Total Bilirubin: 0.5 mg/dL (ref 0.2–1.2)
Total Protein: 8.8 g/dL — ABNORMAL HIGH (ref 6.0–8.3)

## 2021-08-08 LAB — IRON: Iron: 82 ug/dL (ref 42–145)

## 2021-08-11 ENCOUNTER — Ambulatory Visit: Payer: 59

## 2021-08-11 NOTE — Progress Notes (Deleted)
Amber [name] is a 61 y.o. female  presents to the office today for a B12 injection, per physician's orders. Original order: per [name], NP   cyanocobalamin, 1000 mg/ml IM was administered in R deltoid today (pt's L arm in sling). This is the first of her monthly B12 injections.   Patient tolerated injection well.    Next appointment:

## 2021-09-01 ENCOUNTER — Other Ambulatory Visit: Payer: Self-pay | Admitting: Family

## 2021-09-01 ENCOUNTER — Telehealth (INDEPENDENT_AMBULATORY_CARE_PROVIDER_SITE_OTHER): Payer: 59 | Admitting: Family

## 2021-09-01 DIAGNOSIS — U071 COVID-19: Secondary | ICD-10-CM

## 2021-09-01 MED ORDER — MOLNUPIRAVIR EUA 200MG CAPSULE: 4.0000 | ORAL_CAPSULE | Freq: Two times a day (BID) | ORAL | 0 refills | Status: DC

## 2021-09-01 MED ORDER — HYDROCODONE BIT-HOMATROP MBR 5-1.5 MG/5ML PO SOLN: 5.0000 mL | Freq: Three times a day (TID) | ORAL | 0 refills | Status: DC | PRN

## 2021-09-01 NOTE — Progress Notes (Signed)
Amber [name] is a 61 y.o. female with the following history as recorded in EpicCare:  Patient Active Problem List   Diagnosis Date Noted   Skin rash 08/07/2021   High risk HPV infection 10/17/2020   Cerebrovascular disease 10/13/2020   Colon polyps 09/26/2020   GERD (gastroesophageal reflux disease) 09/26/2020   B12 deficiency 05/03/2020   Hyperlipidemia 05/03/2020   Asthma 05/03/2020   Diabetic peripheral neuropathy ([place]) 01/05/2020   Class 2 severe obesity with serious comorbidity and body mass index (BMI) of 38.0 to 38.9 in adult, unspecified obesity type ([place]) 01/05/2020   Adrenal mass, right ([place]) 01/05/2020   Hypertensive heart disease 09/04/2017   Controlled type 2 diabetes mellitus without complication, without long-term current use of insulin ([place]) 05/31/2017   Chronic diastolic CHF (congestive heart failure) ([place]) 04/24/2017   CAD (coronary artery disease) 04/24/2017   History of BCG vaccination 06/14/2016   Fibroids 07/27/2014   Migraine 04/19/2014   Nail fungus 04/09/2014   Elevated serum protein level 04/09/2014   Preventative health care 04/09/2014   Iron deficiency anemia 09/02/2013   Headache(784.0) 03/04/2013   Obesity (BMI 30-39.9) 10/17/2012   Palpitations 10/01/2012   Glaucoma 11/07/2011   Rheumatoid arthritis ([place]) 11/07/2011   Essential hypertension 11/05/2011   Sjogren's disease ([place]) 11/05/2011   Anemia 09/22/2010   Fibroid uterus 09/22/2010   Neutropenia ([place]) 09/22/2010    Current Outpatient Medications  Medication Sig Dispense Refill   HYDROcodone bit-homatropine (HYCODAN) 5-1.5 MG/5ML syrup Take 5 mLs by mouth every 8 (eight) hours as needed for cough. 120 mL 0   molnupiravir EUA (LAGEVRIO) 200 mg CAPS capsule Take 4 capsules (800 mg total) by mouth 2 (two) times daily for 5 days. 40 capsule 0   Abatacept (ORENCIA CLICKJECT) 326 MG/ML SOAJ Inject '125mg'$  into the skin every 10 days.     aspirin EC 81 MG tablet Take 1 tablet (81 mg total) by  mouth daily. 90 tablet 3   calcium-vitamin D (OSCAL WITH D) 500-200 MG-UNIT TABS tablet Take by mouth.     Cyanocobalamin (VITAMIN B-12) 5000 MCG LOZG Take 1 tablet by mouth 2 (two) times daily.     cyclobenzaprine (FLEXERIL) 5 MG tablet Take 0.5 tablets (2.5 mg total) by mouth as needed for muscle spasms. 30 tablet 5   dapagliflozin propanediol (FARXIGA) 10 MG TABS tablet Take 1 tablet (10 mg total) by mouth daily before breakfast. 90 tablet 3   Galcanezumab-gnlm (EMGALITY) 120 MG/ML SOAJ Inject 120 mg into the skin every 30 (thirty) days. 1.12 mL 11   hydrochlorothiazide (HYDRODIURIL) 25 MG tablet Take 1 tablet (25 mg total) by mouth daily. 90 tablet 0   metoprolol succinate (TOPROL-XL) 100 MG 24 hr tablet Take 1 tablet (100 mg total) by mouth daily. Take with or immediately following a meal. 90 tablet 1   nystatin cream (MYCOSTATIN) Apply 1 Application topically 2 (two) times daily. 30 g 0   rosuvastatin (CRESTOR) 10 MG tablet Take 1 tablet (10 mg total) by mouth daily. 90 tablet 3   SUMAtriptan (IMITREX) 100 MG tablet Take 1 tablet (100 mg total) by mouth as needed for migraine. May repeat in 2 hours if headache persists or recurs. 10 tablet 11   TRAVATAN Z 0.004 % SOLN ophthalmic solution Place 1 drop into both eyes at bedtime.      valsartan (DIOVAN) 160 MG tablet Take 1 tablet (160 mg total) by mouth daily. 30 tablet 0   No current facility-administered medications for this visit.  Allergies: Patient has no known allergies.  Past Medical History:  Diagnosis Date   Anemia    Arthritis    Asthma 05/03/2020   Blood transfusion without reported diagnosis    CAD (coronary artery disease) 04/24/2017   Cerebrovascular disease 10/13/2020   Chronic diastolic CHF (congestive heart failure) ([place]) 04/24/2017   Colon polyps    Controlled type 2 diabetes mellitus without complication, without long-term current use of insulin ([place]) 05/31/2017   Diabetic peripheral neuropathy ([place]) 01/05/2020    Ectopic pregnancy    RIGHT salpingostomy   Elective abortion    Elective abortion 09/26/2020   RIGHT salpingostomy   Essential hypertension 09/04/2017   GERD (gastroesophageal reflux disease)    Glaucoma    both eyes   Headache    migraines   History of chicken pox    History of chicken pox 09/26/2020   migraines   Hyperlipemia    Hypertension    Iron deficiency anemia 09/02/2013   Migraine 04/19/2014   Obesity (BMI 30-39.9) 10/17/2012   Palpitations 10/01/2012   Rheumatoid arthritis ([place]) 11/07/2011   Rheumatoid arthritis(714.0) 11/07/2011   SAB (spontaneous abortion)    SAB (spontaneous abortion) 11/07/2011   Shortness of breath    Sjogren's disease ([place])    Wheezing     Past Surgical History:  Procedure Laterality Date   ABDOMINAL SURGERY     bowel repair   COLONOSCOPY     DILATION AND CURETTAGE OF UTERUS     X 2   MENISCUS REPAIR Right    MENISCUS TEAR REPAIR   MYOMECTOMY N/A 07/27/2014   Procedure: ABDOMINAL MYOMECTOMY;  Surgeon: Waymon Amato, MD;  Location: [facility];  Service: Gynecology;  Laterality: N/A;   PELVIC LAPAROSCOPY  1994   IN 1999 LAPAROSCOPY WITH INCIDENTAL ENTEROTOMY REQUIRING LAPAROTOMY   UTERINE FIBROID SURGERY     July 2016    Family History  Problem Relation Age of Onset   Hypertension Mother        died from heart disease   Heart disease Mother        deceased, unknown cause   Stroke Mother    Obesity Mother    Diabetes Sister    Hypertension Sister    Esophageal cancer Sister    Stomach cancer Sister    Cancer Sister    Diabetes Sister    Rectal cancer Neg Hx    Colon cancer Neg Hx     Social History   Tobacco Use   Smoking status: Never    Passive exposure: Never   Smokeless tobacco: Never  Substance Use Topics   Alcohol use: No    Alcohol/week: 0.0 standard drinks of alcohol    Subjective:    I connected with Amber [name] on 09/01/21 at  1:20 PM EDT by a telephone call and verified that I am speaking with the correct person  using two identifiers.   I discussed the limitations of evaluation and management by telemedicine and the availability of in person appointments. The patient expressed understanding and agreed to proceed. Provider in office/ patient is at home; provider and patient are only 2 people on telephone call.   + cold/ congestions symptoms started on Wednesday pm; tested + for COVID this am; no chest pain or shortness of breath; + headache/ sore throat; OTC Tylenol and Coricidin;      Objective:  There were no vitals filed for this visit.  Lungs: Respirations unlabored;  Neurologic: Alert and oriented; speech intact; face  symmetrical; moves all extremities well; CNII-XII intact without focal deficit   Assessment:  1. COVID-19     Plan:  Rx for Molnupiravir and Hycodan cough syrup; increase fluids, rest; work note given as requested; follow up in office if symptoms are worse, no better.   No follow-ups on file.  No orders of the defined types were placed in this encounter.   Requested Prescriptions   Signed Prescriptions Disp Refills   molnupiravir EUA (LAGEVRIO) 200 mg CAPS capsule 40 capsule 0    Sig: Take 4 capsules (800 mg total) by mouth 2 (two) times daily for 5 days.   HYDROcodone bit-homatropine (HYCODAN) 5-1.5 MG/5ML syrup 120 mL 0    Sig: Take 5 mLs by mouth every 8 (eight) hours as needed for cough.

## 2021-09-03 ENCOUNTER — Other Ambulatory Visit: Payer: Self-pay

## 2021-09-03 ENCOUNTER — Emergency Department (HOSPITAL_BASED_OUTPATIENT_CLINIC_OR_DEPARTMENT_OTHER)
Admission: EM | Admit: 2021-09-03 | Discharge: 2021-09-03 | Disposition: A | Discharge status: Home / Self Care | Payer: 59 | Attending: Emergency Medicine | Admitting: Emergency Medicine

## 2021-09-03 DIAGNOSIS — U071 COVID-19: Secondary | ICD-10-CM | POA: Diagnosis not present

## 2021-09-03 DIAGNOSIS — I2510 Atherosclerotic heart disease of native coronary artery without angina pectoris: Secondary | ICD-10-CM | POA: Insufficient documentation

## 2021-09-03 DIAGNOSIS — J029 Acute pharyngitis, unspecified: Secondary | ICD-10-CM | POA: Diagnosis present

## 2021-09-03 DIAGNOSIS — J45909 Unspecified asthma, uncomplicated: Secondary | ICD-10-CM | POA: Insufficient documentation

## 2021-09-03 DIAGNOSIS — E1140 Type 2 diabetes mellitus with diabetic neuropathy, unspecified: Secondary | ICD-10-CM | POA: Insufficient documentation

## 2021-09-03 DIAGNOSIS — I5032 Chronic diastolic (congestive) heart failure: Secondary | ICD-10-CM | POA: Diagnosis not present

## 2021-09-03 DIAGNOSIS — I110 Hypertensive heart disease with heart failure: Secondary | ICD-10-CM | POA: Diagnosis not present

## 2021-09-03 MED ORDER — BENZONATATE 100 MG PO CAPS: 100.0000 mg | ORAL_CAPSULE | Freq: Three times a day (TID) | ORAL | 0 refills | Status: DC | PRN

## 2021-09-03 MED ORDER — MOLNUPIRAVIR EUA 200MG CAPSULE: 4.0000 | ORAL_CAPSULE | Freq: Two times a day (BID) | ORAL | 0 refills | Status: AC

## 2021-09-03 NOTE — ED Triage Notes (Signed)
Pt took home Covid test on Friday and was given medication that was expired. Pt states she is not getting any better and they advised she come to the hospital.

## 2021-09-03 NOTE — ED Provider Notes (Signed)
Emergency Department Provider Note   I have reviewed the triage vital signs and the nursing notes.   HISTORY  Chief Complaint Sore Throat   HPI ELLISHA [name] is a 61 y.o. female with past history reviewed below presents emergency department with COVID-positive test at home and started on viral medication.  She is been taking this for the past 2 days but has not been feeling much better.  She looked at the bottle and saw that her pharmacy seems to have dispensed expired medication.  She attempted to reach out to the pharmacy but due to the holiday they are closed.  She is not able to follow with her primary care physician for this reason also.  She is requesting refill of the antiviral so that she can get a nonexpired prescription.  She is not having chest pain, shortness of breath, other sudden/severe symptoms.   Past Medical History:  Diagnosis Date   Anemia    Arthritis    Asthma 05/03/2020   Blood transfusion without reported diagnosis    CAD (coronary artery disease) 04/24/2017   Cerebrovascular disease 10/13/2020   Chronic diastolic CHF (congestive heart failure) ([place]) 04/24/2017   Colon polyps    Controlled type 2 diabetes mellitus without complication, without Amber [name] current use of insulin ([place]) 05/31/2017   Diabetic peripheral neuropathy ([place]) 01/05/2020   Ectopic pregnancy    RIGHT salpingostomy   Elective abortion    Elective abortion 09/26/2020   RIGHT salpingostomy   Essential hypertension 09/04/2017   GERD (gastroesophageal reflux disease)    Glaucoma    both eyes   Headache    migraines   History of chicken pox    History of chicken pox 09/26/2020   migraines   Hyperlipemia    Hypertension    Iron deficiency anemia 09/02/2013   Migraine 04/19/2014   Obesity (BMI 30-39.9) 10/17/2012   Palpitations 10/01/2012   Rheumatoid arthritis ([place]) 11/07/2011   Rheumatoid arthritis(714.0) 11/07/2011   SAB (spontaneous abortion)    SAB (spontaneous abortion) 11/07/2011    Shortness of breath    Sjogren's disease ([place])    Wheezing     Review of Systems  Constitutional: Positive fever/chills Eyes: No visual changes. ENT: No sore throat. Cardiovascular: Denies chest pain. Respiratory: Denies shortness of breath. Positive cough.  Gastrointestinal: No abdominal pain.  No nausea, no vomiting.  No diarrhea.  No constipation. Genitourinary: Negative for dysuria. Musculoskeletal: Negative for back pain. Skin: Negative for rash. Neurological: Negative for HA.   ____________________________________________   PHYSICAL EXAM:  VITAL SIGNS: ED Triage Vitals  Enc Vitals Group     BP 09/03/21 1220 (!) 149/83     Pulse Rate 09/03/21 1220 98     Resp 09/03/21 1220 18     Temp 09/03/21 1220 98.3 F (36.8 C)     Temp Source 09/03/21 1220 Oral     SpO2 09/03/21 1220 97 %     Weight 09/03/21 1214 210 lb (95.3 kg)     Height 09/03/21 1214 '5\' 1"'$  (1.549 m)   Constitutional: Alert and oriented. Well appearing and in no acute distress. Eyes: Conjunctivae are normal. Head: Atraumatic. Nose: No congestion/rhinnorhea. Mouth/Throat: Mucous membranes are moist.  Neck: No stridor.   Cardiovascular: Normal rate, regular rhythm. Good peripheral circulation. Grossly normal heart sounds.   Respiratory: Normal respiratory effort.  No retractions. Lungs CTAB. Gastrointestinal: Soft and nontender. No distention.  Musculoskeletal: No lower extremity tenderness nor edema. No gross deformities of extremities. Neurologic:  Normal  speech and language. No gross focal neurologic deficits are appreciated.  Skin:  Skin is warm, dry and intact. No rash noted.   ____________________________________________   PROCEDURES  Procedure(s) performed:   Procedures  None  ____________________________________________   INITIAL IMPRESSION / ASSESSMENT AND PLAN / ED COURSE  Pertinent labs & imaging results that were available during my care of the patient were reviewed by me and  considered in my medical decision making (see chart for details).    Medical Decision Making: Summary:  Patient is COVID-positive from outside encounter and started on antivirals but apparently was dispensed expired medication.  I do plan to refill this medicine after review of her bottle at the bedside.  No hypoxemia, increased work of breathing, other indication to consider chest x-ray or basic lab work although this was considered.  Discussed tricked ED return precautions and quarantine precautions as well.  Disposition: discharge  ____________________________________________  FINAL CLINICAL IMPRESSION(S) / ED DIAGNOSES  Final diagnoses:  COVID-19     NEW OUTPATIENT MEDICATIONS STARTED DURING THIS VISIT:  Discharge Medication List as of 09/03/2021 12:27 PM     START taking these medications   Details  benzonatate (TESSALON) 100 MG capsule Take 1 capsule (100 mg total) by mouth 3 (three) times daily as needed for cough., Starting Sun 09/03/2021, Print        Note:  This document was prepared using Dragon voice recognition software and may include unintentional dictation errors.  Nanda Quinton, MD, [facility] Emergency Medicine    Remee Charley, [facility], MD 09/07/21 [phone]

## 2021-09-03 NOTE — Discharge Instructions (Signed)
You were seen in the emergency room today with COVID symptoms.  I am writing a new prescription for your antiviral and some cough medication due to the initial prescription being expired.  Please continue to take this, remain in quarantine, follow closely with your primary care physician by phone.

## 2021-09-05 ENCOUNTER — Telehealth: Payer: Self-pay

## 2021-09-05 NOTE — Telephone Encounter (Signed)
Nurse Assessment Nurse: Waymond Cera, RN, Benjamine Mola Date/Time (Eastern Time): 09/03/2021 9:42:49 AM Confirm and document reason for call. If symptomatic, describe symptoms. ---Caller states that she has fever,sore throat, and chills. States s/s started on Wed. States tested positive for covid on Friday. States that the bottle has an expiraation date of July of 2023. Molnupiravir. States not any better than when she spoke with the office on Friday. Does the patient have any new or worsening symptoms? ---Yes Will a triage be completed? ---Yes Related visit to physician within the last 2 weeks? ---No Does the PT have any chronic conditions? (i.e. diabetes, asthma, this includes High risk factors for pregnancy, etc.) ---Yes List chronic conditions. ---HTN "borderline diabetes" sojourners disease Is this a behavioral health or substance abuse call? ---No Nurse: Waymond Cera, RN, Benjamine Mola Date/Time (Eastern Time): 09/03/2021 9:48:49 AM Please select the assessment type ---Refill Additional Documentation ---Caller is wanting refill on medication, so it is not expired. Does the patient have enough medication to last until the office opens? ---Yes Additional Documentation ---Advised caller that meds are not handled after hours. Advised that she should call the pharmacy (after seen PLEASE NOTE: All timestamps contained within this report are represented as [place] Standard Time. CONFIDENTIALTY NOTICE: This fax transmission is intended only for the addressee. It contains information that is legally privileged, confidential or otherwise protected from use or disclosure. If you are not the intended recipient, you are strictly prohibited from reviewing, disclosing, copying using or disseminating any of this information or taking any action in reliance on or regarding this information. If you have received this fax in error, please notify us immediately by telephone so that we can arrange for its return to [place].  Phone: [phone], Toll-Free: [phone], Fax: [phone] Page: 2 of 2 Call Id: [id number] Nurse Assessment at the ER to let them know that she received expired meds.) Guidelines Guideline Title Affirmed Question Affirmed Notes Nurse Date/Time Eilene Ghazi Time) COVID-19 - Diagnosed or Suspected MODERATE difficulty breathing (e.g., speaks in phrases, SOB even at rest, pulse 100-120) Cantrell, RN, Benjamine Mola 09/03/2021 9:45:12 AM Disp. Time Eilene Ghazi Time) Disposition Final User 09/03/2021 9:31:03 AM Send To RN Personal Jearld Pies, RN, Lovena Le 09/03/2021 9:48:24 AM Go to ED Now Yes Waymond Cera, RN, Benjamine Mola Final Disposition 09/03/2021 9:48:24 AM Go to ED Now Yes Cantrell, RN, Lorin Glass Disagree/Comply Disagree Caller Understands Yes PreDisposition InappropriateToAsk Care Advice Given Per Guideline GO TO ED NOW: * You need to be seen in the Emergency Department. TELL HEALTHCARE PERSONNEL THAT YOU MIGHT HAVE COVID-19: * Tell the first healthcare worker you meet that you may have COVID-19. WEAR A MASK - COVER YOUR MOUTH AND NOSE: * Wear a mask that fits snuggly over your mouth and nose. ANOTHER ADULT SHOULD DRIVE: * It is better and safer if another adult drives instead of you. CALL EMS 911 IF: * Severe difficulty breathing occurs * Lips or face turns blue * Confusion occurs. CARE ADVICE given per COVID-19 - DIAGNOSED OR SUSPECTED (Adult) guideline. Comments User: Sharol Given, RN Date/Time Eilene Ghazi Time): 09/03/2021 9:52:09 AM Caller states that she will have to wait until after church to see if she can get a ride to the ER, and she doesn't know if anyone will want to take her. Advised that she can always call 911. Advised risk of not going is getting worse and possible death. Verbalizes understanding. Referrals GO TO FACILITY REFUSED

## 2021-09-05 NOTE — Telephone Encounter (Signed)
Pt seen in ED 

## 2021-09-06 ENCOUNTER — Telehealth: Payer: 59 | Admitting: Medical

## 2021-09-06 DIAGNOSIS — U071 COVID-19: Secondary | ICD-10-CM | POA: Diagnosis not present

## 2021-09-06 DIAGNOSIS — R059 Cough, unspecified: Secondary | ICD-10-CM

## 2021-09-06 MED ORDER — FLUCONAZOLE 150 MG PO TABS: ORAL_TABLET | ORAL | 0 refills | Status: DC

## 2021-09-06 MED ORDER — ALBUTEROL SULFATE HFA 108 (90 BASE) MCG/ACT IN AERS: 2.0000 | INHALATION_SPRAY | Freq: Four times a day (QID) | RESPIRATORY_TRACT | 0 refills | Status: DC | PRN

## 2021-09-06 MED ORDER — FLUTICASONE PROPIONATE 50 MCG/ACT NA SUSP: 2.0000 | Freq: Every day | NASAL | 1 refills | Status: DC

## 2021-09-06 NOTE — Progress Notes (Signed)
   Subjective:    Patient ID: Amber [name], female    DOB: 24-Dec-1960, 61 y.o.   MRN: [id number]  HPI  Virtual Visit via Video Note  I connected with Amber [name] on 09/06/21 at  1:20 PM EDT by a video enabled telemedicine application and verified that I am speaking with the correct person using two identifiers.  Location: Patient: home Provider: office   I discussed the limitations of evaluation and management by telemedicine and the availability of in person appointments. The patient expressed understanding and agreed to proceed.  History of Present Illness: Pt dx on 09-03-2021. She got tested on day 5 of her symptoms. She was given antiviral medication. Pt has been on molnupiravir.   Pt also given benzonatate. Some asthma as adult.   Pt states she has felt some mild dizziness with covid.   Just recently she started to get vaginal discharge. Describes white vaginal discharge. She gets yeast infections easily. Emergency dept did not give antibiotic.   Pt never had covid before. Vaccinated x 4.  Pt does not have 02 sat %. No monitor at home.  Attempted to review phone note from ED validated but could not find details ED visit.  Patient sent.  Visit today.   Observations/Objective: General-no acute distress, pleasant, oriented. Lungs- on inspection lungs appear unlabored. Neck- no tracheal deviation or jvd on inspection. Neuro- gross motor function appears intact.   Assessment and Plan: Patient Instructions  Recent COVID infection with some persisting symptoms.  Presently just describing some transient occasional dizziness.  Also describing some intermittent productive cough and mild wheezing.  He is already taking molnupiravir antiviral.  She has been vaccinated 4 times.  Continue benzonatate for cough and prescribing albuterol inhaler.  Does not have O2 sat monitor but if getting any shortness of breath get O2 sat monitor and want to see O2 saturation percentage is  96% or above.  If lower notify us.  Placed chest x-ray ordered to be done today or tomorrow morning.  If x-ray shows pneumonia we will prescribe azithromycin.  For nasal congestion prescribed Flonase.  Recent white vaginal discharge with itching.  Probable yeast infection.  Prescription of Diflucan sent to pharmacy.  Follow-up in 7 days or sooner if needed.   Mackie Pai, PA-C   Follow Up Instructions:    I discussed the assessment and treatment plan with the patient. The patient was provided an opportunity to ask questions and all were answered. The patient agreed with the plan and demonstrated an understanding of the instructions.   The patient was advised to call back or seek an in-person evaluation if the symptoms worsen or if the condition fails to improve as anticipated.     Mackie Pai, PA-C   Review of Systems  Constitutional:  Negative for chills.  Respiratory:  Positive for cough and wheezing.   Cardiovascular:  Negative for chest pain and palpitations.  Gastrointestinal:  Negative for abdominal pain.  Genitourinary:        White discharge with vaginal itch.  Musculoskeletal:  Negative for back pain.  Hematological:  Negative for adenopathy. Does not bruise/bleed easily.       Objective:   Physical Exam        Assessment & Plan:

## 2021-09-06 NOTE — Patient Instructions (Signed)
Recent COVID infection with some persisting symptoms.  Presently just describing some transient occasional dizziness.  Also describing some intermittent productive cough and mild wheezing.  He is already taking molnupiravir antiviral.  She has been vaccinated 4 times.  Continue benzonatate for cough and prescribing albuterol inhaler.  Does not have O2 sat monitor but if getting any shortness of breath get O2 sat monitor and want to see O2 saturation percentage is 96% or above.  If lower notify us.  Placed chest x-ray ordered to be done today or tomorrow morning.  If x-ray shows pneumonia we will prescribe azithromycin.  For nasal congestion prescribed Flonase.  Recent white vaginal discharge with itching.  Probable yeast infection.  Prescription of Diflucan sent to pharmacy.  Follow-up in 7 days or sooner if needed.

## 2021-09-07 ENCOUNTER — Ambulatory Visit: Payer: 59

## 2021-09-07 ENCOUNTER — Ambulatory Visit (INDEPENDENT_AMBULATORY_CARE_PROVIDER_SITE_OTHER): Payer: 59

## 2021-09-07 ENCOUNTER — Ambulatory Visit (HOSPITAL_BASED_OUTPATIENT_CLINIC_OR_DEPARTMENT_OTHER)
Admission: RE | Admit: 2021-09-07 | Discharge: 2021-09-07 | Disposition: A | Discharge status: Home / Self Care | Payer: 59 | Source: Ambulatory Visit | Attending: Medical | Admitting: Medical

## 2021-09-07 DIAGNOSIS — E538 Deficiency of other specified B group vitamins: Secondary | ICD-10-CM | POA: Diagnosis not present

## 2021-09-07 DIAGNOSIS — R059 Cough, unspecified: Secondary | ICD-10-CM | POA: Insufficient documentation

## 2021-09-07 MED ORDER — CYANOCOBALAMIN 1000 MCG/ML IJ SOLN
1000.0000 ug | Freq: Once | INTRAMUSCULAR | Status: AC
  Administered 2021-09-07: 1000 ug via INTRAMUSCULAR

## 2021-09-07 NOTE — Progress Notes (Signed)
Amber [name] is a 61 y.o. female  presents to the office today for a B12 injection, per physician's orders. Original order: per [name], NP   cyanocobalamin, 1000 mg/ml IM was administered in R deltoid today. This is the third of her monthly B12 injections.   Patient tolerated injection well.   Next appt scheduled 10/10/21 @ 9:30 am

## 2021-09-08 ENCOUNTER — Ambulatory Visit: Payer: 59

## 2021-09-08 ENCOUNTER — Other Ambulatory Visit: Payer: Self-pay | Admitting: Orthopaedic Surgery

## 2021-09-08 DIAGNOSIS — R599 Enlarged lymph nodes, unspecified: Secondary | ICD-10-CM

## 2021-09-08 DIAGNOSIS — R221 Localized swelling, mass and lump, neck: Secondary | ICD-10-CM

## 2021-09-12 ENCOUNTER — Ambulatory Visit
Admission: RE | Admit: 2021-09-12 | Discharge: 2021-09-12 | Disposition: A | Discharge status: Home / Self Care | Payer: 59 | Source: Ambulatory Visit | Attending: Orthopaedic Surgery | Admitting: Orthopaedic Surgery

## 2021-09-12 DIAGNOSIS — R599 Enlarged lymph nodes, unspecified: Secondary | ICD-10-CM

## 2021-09-12 DIAGNOSIS — R221 Localized swelling, mass and lump, neck: Secondary | ICD-10-CM

## 2021-09-18 ENCOUNTER — Other Ambulatory Visit: Payer: Self-pay | Admitting: *Deleted

## 2021-09-18 MED ORDER — ORENCIA CLICKJECT 125 MG/ML ~~LOC~~ SOAJ
SUBCUTANEOUS | 0 refills | Status: DC
Start: 2021-09-18 — End: 2021-12-04

## 2021-09-18 NOTE — Telephone Encounter (Signed)
Refill request received via fax from [facility] for [facility]   Next Visit: 10/03/2021  Last Visit: 05/01/2021  WG:[id number] arthritis involving multiple sites with positive rheumatoid factor   Current Dose per office note 05/01/2021: Orencia 125 mg subcutaneous every 10 days.  Labs: 08/07/2021 Glucose 156, Total Protein 8.8  TB Gold: 01/26/2021 Neg   Okay to refill Orencia?

## 2021-09-19 NOTE — Progress Notes (Deleted)
Office Visit Note  Patient: Amber [name]             Date of Birth: March 14, 1960           MRN: [id number]             PCP: [name], NP Referring: [name], NP Visit Date: 10/03/2021 Occupation: '@GUAROCC'$ @  Subjective:  No chief complaint on file.   History of Present Illness: Amber [name] is a 61 y.o. female ***   Activities of Daily Living:  Patient reports morning stiffness for *** {minute/hour:19697}.   Patient {ACTIONS;DENIES/REPORTS:21021675::"Denies"} nocturnal pain.  Difficulty dressing/grooming: {ACTIONS;DENIES/REPORTS:21021675::"Denies"} Difficulty climbing stairs: {ACTIONS;DENIES/REPORTS:21021675::"Denies"} Difficulty getting out of chair: {ACTIONS;DENIES/REPORTS:21021675::"Denies"} Difficulty using hands for taps, buttons, cutlery, and/or writing: {ACTIONS;DENIES/REPORTS:21021675::"Denies"}  No Rheumatology ROS completed.   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   Hypertensive heart disease 09/04/2017   Controlled type 2 diabetes mellitus without complication, without long-term current use of insulin ([facility]) 05/31/2017   Chronic diastolic CHF (congestive heart failure) ([place]) 04/24/2017   CAD (coronary artery disease) 04/24/2017   History of BCG vaccination 06/14/2016   Fibroids 07/27/2014   Migraine 04/19/2014   Nail fungus 04/09/2014   Elevated serum protein level 04/09/2014   Preventative health care 04/09/2014   Iron  deficiency anemia 09/02/2013   Headache(784.0) 03/04/2013   Obesity (BMI 30-39.9) 10/17/2012   Palpitations 10/01/2012   Glaucoma 11/07/2011   Rheumatoid arthritis ([place]) 11/07/2011   Essential hypertension 11/05/2011   Sjogren's disease ([place]) 11/05/2011   Anemia 09/22/2010   Fibroid uterus 09/22/2010   Neutropenia ([place]) 09/22/2010    Past Medical History:  Diagnosis Date   Anemia    Arthritis    Asthma 05/03/2020   Blood transfusion without reported diagnosis    CAD (coronary artery disease) 04/24/2017   Cerebrovascular disease 10/13/2020   Chronic diastolic CHF (congestive heart failure) ([place]) 04/24/2017   Colon polyps    Controlled type 2 diabetes mellitus without complication, without long-term current use of insulin ([place]) 05/31/2017   Diabetic peripheral neuropathy ([place]) 01/05/2020   Ectopic pregnancy    RIGHT salpingostomy   Elective abortion    Elective abortion 09/26/2020   RIGHT salpingostomy   Essential hypertension 09/04/2017   GERD (gastroesophageal reflux disease)    Glaucoma    both eyes   Headache    migraines   History of chicken pox    History of chicken pox 09/26/2020   migraines   Hyperlipemia    Hypertension    Iron deficiency anemia 09/02/2013   Migraine 04/19/2014   Obesity (BMI 30-39.9) 10/17/2012   Palpitations 10/01/2012   Rheumatoid arthritis ([place]) 11/07/2011   Rheumatoid arthritis(714.0) 11/07/2011   SAB (spontaneous abortion)    SAB (spontaneous abortion) 11/07/2011   Shortness of breath    Sjogren's disease ([place])    Wheezing     Family History  Problem Relation Age of Onset   Hypertension Mother        died from heart disease  Heart disease Mother        deceased, unknown cause   Stroke Mother    Obesity Mother    Diabetes Sister    Hypertension Sister    Esophageal cancer Sister    Stomach cancer Sister    Cancer Sister    Diabetes Sister    Rectal cancer Neg Hx    Colon cancer Neg Hx    Past Surgical History:  Procedure Laterality  Date   ABDOMINAL SURGERY     bowel repair   COLONOSCOPY     DILATION AND CURETTAGE OF UTERUS     X 2   MENISCUS REPAIR Right    MENISCUS TEAR REPAIR   MYOMECTOMY N/A 07/27/2014   Procedure: ABDOMINAL MYOMECTOMY;  Surgeon: Waymon Amato, MD;  Location: [facility];  Service: Gynecology;  Laterality: N/A;   PELVIC LAPAROSCOPY  1994   IN 1999 LAPAROSCOPY WITH INCIDENTAL ENTEROTOMY REQUIRING LAPAROTOMY   UTERINE FIBROID SURGERY     July 2016   Social History   Social History Narrative   Regular exercise:  No   Caffeine Use: 2 cups coffee daily   No children   "Amber [name]"   Reports that she is involved at her church   Works as an Therapist, 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  Had a sister up in [place] who passed away 03/31/15 who she was close to.   Born in [place]- she came to [place] as adult.  Age 65.    Left-handed.         Immunization History  Administered Date(s) Administered   Hepatitis A, Adult 11/01/2017, 12/03/2018   IPV 11/01/2017   Influenza Split 11/07/2011   Influenza,inj,Quad PF,6+ Mos 10/17/2012, 01/15/2014, 08/31/2014, 09/30/2017, 09/10/2018, 09/21/2019, 10/07/2020   Meningococcal Mcv4o 11/01/2017   Moderna Sars-Covid-2 Vaccination 01/28/2019, 02/27/2019, 11/27/2019   Pneumococcal Conjugate-13 08/05/2020   Pneumococcal Polysaccharide-23 09/10/2018   Tdap 06/01/2011, 11/25/2012   Typhoid Live 11/01/2017   Zoster Recombinat (Shingrix) 10/04/2017, 12/04/2017     Objective: Vital Signs: LMP 08/09/2016    Physical Exam   Musculoskeletal Exam: ***  CDAI Exam: CDAI Score: -- Patient Global: --; Provider Global: -- Swollen: --; Tender: -- Joint Exam 10/03/2021   No joint exam has been documented for this visit   There is currently no information documented on the homunculus. Go to the Rheumatology activity and complete the homunculus joint exam.  Investigation: No additional findings.  Imaging: US SOFT TISSUE HEAD & NECK (NON-THYROID)  Result Date: 09/13/2021 CLINICAL  DATA:  Generalized left neck swelling EXAM: ULTRASOUND OF HEAD/NECK SOFT TISSUES TECHNIQUE: Ultrasound examination of the head and neck soft tissues was performed in the area of clinical concern. COMPARISON:  None Available. FINDINGS: Ultrasound performed of the left supraclavicular area of concern and compared to the right. In the left supraclavicular area, there are a few scattered superficial benign-appearing lymph nodes with short axis measurements up to 6 mm and preserved architecture. No bulky adenopathy, soft tissue mass, cyst or fluid collection. For comparison purposes, no right supraclavicular abnormality appreciated. IMPRESSION: Benign-appearing left supraclavicular lymph nodes. Electronically Signed   By: Jerilynn Mages.  Shick M.D.   On: 09/13/2021 08:20   DG Chest 2 View  Result Date: 09/07/2021 CLINICAL DATA:  cough that is productive. recent covid dx last week/ EXAM: CHEST - 2 VIEW COMPARISON:  Radiograph 11/01/2017 FINDINGS: The cardiomediastinal silhouette is within normal limits. There is no focal airspace consolidation. There is no pleural effusion. No pneumothorax. There is mild widening of the left [facility]  joint, potentially postsurgical change or prior AC joint injury. IMPRESSION: No evidence of acute cardiopulmonary disease. Electronically Signed   By: Maurine Simmering M.D.   On: 09/07/2021 10:15    Recent Labs: Lab Results  Component Value Date   WBC 5.5 08/07/2021   HGB 12.8 08/07/2021   PLT 250.0 08/07/2021   NA 140 08/07/2021   K 4.2 08/07/2021   CL 102 08/07/2021   CO2 26 08/07/2021   GLUCOSE 156 (H) 08/07/2021   BUN 19 08/07/2021   CREATININE 0.75 08/07/2021   BILITOT 0.5 08/07/2021   ALKPHOS 56 08/07/2021   AST 12 08/07/2021   ALT 15 08/07/2021   PROT 8.8 (H) 08/07/2021   ALBUMIN 4.1 08/07/2021   CALCIUM 10.0 08/07/2021   GFRAA 115 02/19/2020   QFTBGOLD Negative 06/14/2016   QFTBGOLDPLUS NEGATIVE 01/26/2021    Speciality Comments: PLQ eye exam: 12/09/2018 normal. [facility].  Procedures:  No procedures performed Allergies: Patient has no known allergies.   Assessment / Plan:     Visit Diagnoses: No diagnosis found.  Orders: No orders of the defined types were placed in this encounter.  No orders of the defined types were placed in this encounter.   Face-to-face time spent with patient was *** minutes. Greater than 50% of time was spent in counseling and coordination of care.  Follow-Up Instructions: No follow-ups on file.   Earnestine Mealing, CMA  Note - This record has been created using Editor, commissioning.  Chart creation errors have been sought, but may not always  have been located. Such creation errors do not reflect on  the standard of medical care.

## 2021-09-22 ENCOUNTER — Telehealth: Payer: Self-pay | Admitting: Rheumatology

## 2021-09-22 NOTE — Telephone Encounter (Signed)
Returned the call and clarified directions for the Orencia prescription.

## 2021-09-22 NOTE — Telephone Encounter (Signed)
Kayla from [facility] left a voicemail to get clarification for the patient's Orencia prescription.  We need to verify the directions.  Please call back at [phone]

## 2021-09-25 NOTE — Progress Notes (Unsigned)
Office Visit Note  Patient: Amber [name]             Date of Birth: [birth year]           MRN: [id number]             PCP: [name], NP Referring: [name], NP Visit Date: 10/04/2021 Occupation: '@GUAROCC'$ @  Subjective:  Medication monitoring  History of Present Illness: Amber [name] is a 61 y.o. female with history of seropositive rheumatoid arthritis, sjogren's syndrome, and osteoarthritis. She remains on Orencia 125 mg subcutaneous every 10 days. Due for next injection tomorrow.  Patient continues to find Orencia to be effective at managing her rheumatoid arthritis.  She denies any increased joint pain or joint swelling at this time.  She states that she is currently going to physical therapy twice a week after having her left rotator cuff repaired on 06/21/2021.  Her progress has been slow and she continues to be on a weight lifting restriction. She denies any recent or recurrent infections. She denies any new medical conditions.    Activities of Daily Living:  Patient reports morning stiffness for a few minutes.   Patient Denies nocturnal pain.  Difficulty dressing/grooming: Denies Difficulty climbing stairs: Denies Difficulty getting out of chair: Reports Difficulty using hands for taps, buttons, cutlery, and/or writing: Denies  Review of Systems  Constitutional:  Positive for fatigue.  HENT:  Positive for mouth dryness. Negative for mouth sores.   Eyes:  Positive for dryness.  Respiratory:  Positive for shortness of breath.   Cardiovascular:  Positive for palpitations. Negative for chest pain.  Gastrointestinal:  Negative for blood in stool, constipation and diarrhea.  Endocrine: Negative for increased urination.  Genitourinary:  Negative for involuntary urination.  Musculoskeletal:  Positive for joint pain, joint pain, joint swelling, myalgias, muscle weakness, morning stiffness, muscle tenderness and myalgias. Negative for gait problem.   Skin:  Positive for hair loss. Negative for color change, rash and sensitivity to sunlight.  Allergic/Immunologic: Positive for susceptible to infections.  Neurological:  Positive for dizziness and headaches.  Hematological:  Negative for swollen glands.  Psychiatric/Behavioral:  Negative for depressed mood and sleep disturbance. The patient is not nervous/anxious.     PMFS History:  Patient Active Problem List   Diagnosis Date Noted   Skin rash 08/07/2021   High risk HPV infection 10/17/2020   Cerebrovascular disease 10/13/2020   Colon polyps 09/26/2020   GERD (gastroesophageal reflux disease) 09/26/2020   B12 deficiency 05/03/2020   Hyperlipidemia 05/03/2020   Asthma 05/03/2020   Diabetic peripheral neuropathy ([place]) 01/05/2020   Class 2 severe obesity with serious comorbidity and body mass index (BMI) of 38.0 to 38.9 in adult, unspecified obesity type ([place]) 01/05/2020   Adrenal mass, right ([place]) 01/05/2020   Hypertensive heart disease 09/04/2017   Controlled type 2 diabetes mellitus without complication, without long-term current use of insulin ([place]) 05/31/2017   Chronic diastolic CHF (congestive heart failure) ([place]) 04/24/2017   CAD (coronary artery disease) 04/24/2017   History of BCG vaccination 06/14/2016   Fibroids 07/27/2014   Migraine 04/19/2014   Nail fungus 04/09/2014   Elevated serum protein level 04/09/2014   Preventative health care 04/09/2014   Iron deficiency anemia 09/02/2013   Headache(784.0) 03/04/2013   Obesity (BMI 30-39.9) 10/17/2012   Palpitations 10/01/2012   Glaucoma 11/07/2011   Rheumatoid arthritis ([place]) 11/07/2011   Essential hypertension 11/05/2011   Sjogren's disease ([place]) 11/05/2011   Anemia 09/22/2010   Fibroid uterus 09/22/2010  Neutropenia ([place]) 09/22/2010    Past Medical History:  Diagnosis Date   Anemia    Arthritis    Asthma 05/03/2020   Blood transfusion without reported diagnosis    CAD (coronary artery disease) 04/24/2017    Cerebrovascular disease 10/13/2020   Chronic diastolic CHF (congestive heart failure) ([place]) 04/24/2017   Colon polyps    Controlled type 2 diabetes mellitus without complication, without long-term current use of insulin ([place]) 05/31/2017   Diabetic peripheral neuropathy ([place]) 01/05/2020   Ectopic pregnancy    RIGHT salpingostomy   Elective abortion    Elective abortion 09/26/2020   RIGHT salpingostomy   Essential hypertension 09/04/2017   GERD (gastroesophageal reflux disease)    Glaucoma    both eyes   Headache    migraines   History of chicken pox    History of chicken pox 09/26/2020   migraines   Hyperlipemia    Hypertension    Iron deficiency anemia 09/02/2013   Migraine 04/19/2014   Obesity (BMI 30-39.9) 10/17/2012   Palpitations 10/01/2012   Rheumatoid arthritis ([place]) 11/07/2011   Rheumatoid arthritis(714.0) 11/07/2011   SAB (spontaneous abortion)    SAB (spontaneous abortion) 11/07/2011   Shortness of breath    Sjogren's disease ([place])    Wheezing     Family History  Problem Relation Age of Onset   Hypertension Mother        died from heart disease   Heart disease Mother        deceased, unknown cause   Stroke Mother    Obesity Mother    Diabetes Sister    Hypertension Sister    Esophageal cancer Sister    Stomach cancer Sister    Cancer Sister    Diabetes Sister    Rectal cancer Neg Hx    Colon cancer Neg Hx    Past Surgical History:  Procedure Laterality Date   ABDOMINAL SURGERY     bowel repair   COLONOSCOPY     DILATION AND CURETTAGE OF UTERUS     X 2   MENISCUS REPAIR Right    MENISCUS TEAR REPAIR   MYOMECTOMY N/A 07/27/2014   Procedure: ABDOMINAL MYOMECTOMY;  Surgeon: Waymon Amato, MD;  Location: [facility];  Service: Gynecology;  Laterality: N/A;   PELVIC LAPAROSCOPY  1994   IN 1999 LAPAROSCOPY WITH INCIDENTAL ENTEROTOMY REQUIRING LAPAROTOMY   ROTATOR CUFF REPAIR Left 06/21/2021   UTERINE FIBROID SURGERY     July 2016   Social History   Social History  Narrative   Regular exercise:  No   Caffeine Use: 2 cups coffee daily   No children   "Happily divorced"   Reports that she is involved at her church   Works as an Therapist, 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  Had a sister up in [place] who passed away 2015/04/03 who she was close to.   Born in [place]- she came to [place] as adult.  Age 71.    Left-handed.         Immunization History  Administered Date(s) Administered   Hepatitis A, Adult 11/01/2017, 12/03/2018   IPV 11/01/2017   Influenza Split 11/07/2011   Influenza,inj,Quad PF,6+ Mos 10/17/2012, 01/15/2014, 08/31/2014, 09/30/2017, 09/10/2018, 09/21/2019, 10/07/2020   Meningococcal Mcv4o 11/01/2017   Moderna Sars-Covid-2 Vaccination 01/28/2019, 02/27/2019, 11/27/2019   Pneumococcal Conjugate-13 08/05/2020   Pneumococcal Polysaccharide-23 09/10/2018   Tdap 06/01/2011, 11/25/2012   Typhoid Live 11/01/2017   Zoster Recombinat (Shingrix) 10/04/2017, 12/04/2017     Objective: Vital Signs: BP (!) 159/92 (  BP Location: Right Arm, Patient Position: Sitting, Cuff Size: Large)   Pulse 66   Resp 16   Ht '5\' 1"'$  (1.549 m)   Wt 209 lb (94.8 kg)   LMP 08/09/2016   BMI 39.49 kg/m    Physical Exam Vitals and nursing note reviewed.  Constitutional:      Appearance: She is well-developed.  HENT:     Head: Normocephalic and atraumatic.  Eyes:     Conjunctiva/sclera: Conjunctivae normal.  Cardiovascular:     Rate and Rhythm: Normal rate and regular rhythm.     Heart sounds: Normal heart sounds.  Pulmonary:     Effort: Pulmonary effort is normal.     Breath sounds: Normal breath sounds.  Abdominal:     General: Bowel sounds are normal.     Palpations: Abdomen is soft.  Musculoskeletal:     Cervical back: Normal range of motion.  Skin:    General: Skin is warm and dry.     Capillary Refill: Capillary refill takes less than 2 seconds.  Neurological:     Mental Status: She is alert and oriented to person, place, and time.  Psychiatric:         Behavior: Behavior normal.      Musculoskeletal Exam: C-spine has good range of motion.  Right shoulder has full range of motion.  Limited range of motion of the left shoulder joint.  Elbow joints, wrist joints, MCPs, PIPs, DIPs have good range of motion with no synovitis.  Complete fist formation bilaterally.  Limited range of motion of the left hip joint.  Right hip has good range of motion with no discomfort.  No tenderness over trochanteric bursa bilaterally.  Knee joints have good range of motion with no warmth or effusion.  Ankle joints have good range of motion with no tenderness or joint swelling.  CDAI Exam: CDAI Score: -- Patient Global: 5 mm; Provider Global: 5 mm Swollen: --; Tender: -- Joint Exam 10/04/2021   No joint exam has been documented for this visit   There is currently no information documented on the homunculus. Go to the Rheumatology activity and complete the homunculus joint exam.  Investigation: No additional findings.  Imaging: US SOFT TISSUE HEAD & NECK (NON-THYROID)  Result Date: 09/13/2021 CLINICAL DATA:  Generalized left neck swelling EXAM: ULTRASOUND OF HEAD/NECK SOFT TISSUES TECHNIQUE: Ultrasound examination of the head and neck soft tissues was performed in the area of clinical concern. COMPARISON:  None Available. FINDINGS: Ultrasound performed of the left supraclavicular area of concern and compared to the right. In the left supraclavicular area, there are a few scattered superficial benign-appearing lymph nodes with short axis measurements up to 6 mm and preserved architecture. No bulky adenopathy, soft tissue mass, cyst or fluid collection. For comparison purposes, no right supraclavicular abnormality appreciated. IMPRESSION: Benign-appearing left supraclavicular lymph nodes. Electronically Signed   By: Jerilynn Mages.  Shick M.D.   On: 09/13/2021 08:20   DG Chest 2 View  Result Date: 09/07/2021 CLINICAL DATA:  cough that is productive. recent covid dx last week/  EXAM: CHEST - 2 VIEW COMPARISON:  Radiograph 11/01/2017 FINDINGS: The cardiomediastinal silhouette is within normal limits. There is no focal airspace consolidation. There is no pleural effusion. No pneumothorax. There is mild widening of the left AC joint, potentially postsurgical change or prior AC joint injury. IMPRESSION: No evidence of acute cardiopulmonary disease. Electronically Signed   By: Maurine Simmering M.D.   On: 09/07/2021 10:15    Recent Labs: Lab Results  Component Value Date   WBC 5.5 08/07/2021   HGB 12.8 08/07/2021   PLT 250.0 08/07/2021   NA 140 08/07/2021   K 4.2 08/07/2021   CL 102 08/07/2021   CO2 26 08/07/2021   GLUCOSE 156 (H) 08/07/2021   BUN 19 08/07/2021   CREATININE 0.75 08/07/2021   BILITOT 0.5 08/07/2021   ALKPHOS 56 08/07/2021   AST 12 08/07/2021   ALT 15 08/07/2021   PROT 8.8 (H) 08/07/2021   ALBUMIN 4.1 08/07/2021   CALCIUM 10.0 08/07/2021   GFRAA 115 02/19/2020   QFTBGOLD Negative 06/14/2016   QFTBGOLDPLUS NEGATIVE 01/26/2021    Speciality Comments: PLQ eye exam: 12/09/2018 normal. [facility].  Procedures:  No procedures performed Allergies: Patient has no known allergies.     Assessment / Plan:     Visit Diagnoses: Rheumatoid arthritis involving multiple sites with positive rheumatoid factor (HCC) - Positive RF, negative anti-CCP, '14 3 3 '$ eta positive, hx of rheumatoid arthritis for the last 10 years. previously under the care of Dr. Graylon Gunning & [name]: She has no synovitis on examination today.  She has not had any signs or symptoms of a rheumatoid arthritis flare.  She has clinically been doing well on Orencia 125 mg subcutaneous injections every 10 days.  She has not noticed any new or worsening symptoms while spacing the dose of Orencia.  She is currently going to physical therapy twice a week after having the left rotator cuff repaired on 06/21/2021.  She has limited range of motion of the left shoulder joint on examination today.  She is  not experiencing other joint pain or inflammation at this time.  She will remain on Orencia as prescribed.  She is due for her next injection tomorrow.  She was advised to notify us if she develops increased joint pain or joint swelling.  She will follow-up in the office in 5 months or sooner if needed.  Association of heart disease with rheumatoid arthritis was discussed. Need to monitor blood pressure, cholesterol, and to exercise 30-60 minutes on daily basis was discussed.   High risk medication use - Orencia 125 mg subcutaneous every 10 days. Initially started on Orencia 01/28/20. (methotrexate was discontinued due to neutropenia).  CBC and CMP updated on 08/07/21.  White blood cell count was 5.5.  Her next lab work will be due in November and every 3 months.  Her annual physical on 11/07/21 and she will be having updated lab work.  TB gold negative on 01/26/21.  Last month she had a fungal infection was treated with antifungals.  This infection has completely resolved.  She held Nolan during that time.  She has not been experiencing any recurrent infections.  Discussed the importance of holding orencia if she develops signs or symptoms of an infection and to resume once the infection has completely cleared.   Sjogren's syndrome with other organ involvement ([place]) - Anti-Ro positive, anti-La positive: She continues to have chronic sicca symptoms.  Overall her symptoms have been unchanged.  S/P rotator cuff repair: Repaired on 06/21/2021.  Currently going to physical therapy twice a week.  Remains on a 5 pound weight restriction.  Primary osteoarthritis of both knees: She has good range of motion of both knee joints on examination today.  No warmth or effusion noted.  Primary osteoarthritis of both feet: She is not experiencing any increased discomfort or stiffness in her feet at this time.  She has good range of motion of both ankle joints with  no tenderness or synovitis.  She is wearing proper fitting  shoes. X-rays of both feet were updated on 01/30/2021.  Plantar fasciitis of right foot - She was evaluated by Dr. Paulla Dolly on 03/07/2020 and was diagnosed with plantar fasciitis of the right foot.  Resolved.  She is wearing proper fitting shoes.  Neck pain - Followed by Dr. Thedore Mins.  She has good range of motion of the C-spine on examination today.  No symptoms of radiculopathy.  Family history of rheumatoid arthritis - Sister.   Other medical conditions are listed as follows:  Controlled type 2 diabetes mellitus without complication, without long-term current use of insulin ([place])  Coronary artery disease involving native coronary artery of native heart without angina pectoris  Essential hypertension  Palpitations  Chronic diastolic CHF (congestive heart failure) (HCC)  Hx of migraines  Other fatigue  Orders: No orders of the defined types were placed in this encounter.  No orders of the defined types were placed in this encounter.   Follow-Up Instructions: Return in about 5 months (around 03/05/2022) for Rheumatoid arthritis, Sjogren's syndrome, Osteoarthritis.   Ofilia Neas, PA-C  Note - This record has been created using Dragon software.  Chart creation errors have been sought, but may not always  have been located. Such creation errors do not reflect on  the standard of medical care.

## 2021-10-03 ENCOUNTER — Ambulatory Visit: Payer: 59 | Admitting: Physician Assistant

## 2021-10-03 DIAGNOSIS — M0579 Rheumatoid arthritis with rheumatoid factor of multiple sites without organ or systems involvement: Secondary | ICD-10-CM

## 2021-10-03 DIAGNOSIS — I5032 Chronic diastolic (congestive) heart failure: Secondary | ICD-10-CM

## 2021-10-03 DIAGNOSIS — R5383 Other fatigue: Secondary | ICD-10-CM

## 2021-10-03 DIAGNOSIS — I2510 Atherosclerotic heart disease of native coronary artery without angina pectoris: Secondary | ICD-10-CM

## 2021-10-03 DIAGNOSIS — M19072 Primary osteoarthritis, left ankle and foot: Secondary | ICD-10-CM

## 2021-10-03 DIAGNOSIS — M542 Cervicalgia: Secondary | ICD-10-CM

## 2021-10-03 DIAGNOSIS — Z8669 Personal history of other diseases of the nervous system and sense organs: Secondary | ICD-10-CM

## 2021-10-03 DIAGNOSIS — I10 Essential (primary) hypertension: Secondary | ICD-10-CM

## 2021-10-03 DIAGNOSIS — E119 Type 2 diabetes mellitus without complications: Secondary | ICD-10-CM

## 2021-10-03 DIAGNOSIS — M722 Plantar fascial fibromatosis: Secondary | ICD-10-CM

## 2021-10-03 DIAGNOSIS — M3509 Sicca syndrome with other organ involvement: Secondary | ICD-10-CM

## 2021-10-03 DIAGNOSIS — M170 Bilateral primary osteoarthritis of knee: Secondary | ICD-10-CM

## 2021-10-03 DIAGNOSIS — Z79899 Other long term (current) drug therapy: Secondary | ICD-10-CM

## 2021-10-03 DIAGNOSIS — R002 Palpitations: Secondary | ICD-10-CM

## 2021-10-03 DIAGNOSIS — Z8261 Family history of arthritis: Secondary | ICD-10-CM

## 2021-10-04 ENCOUNTER — Ambulatory Visit: Payer: 59 | Attending: Physician Assistant | Admitting: Physician Assistant

## 2021-10-04 ENCOUNTER — Encounter: Payer: Self-pay | Admitting: Physician Assistant

## 2021-10-04 VITALS — BP 159/92 | HR 66 | Resp 16 | Ht 61.0 in | Wt 209.0 lb

## 2021-10-04 DIAGNOSIS — Z9889 Other specified postprocedural states: Secondary | ICD-10-CM

## 2021-10-04 DIAGNOSIS — M170 Bilateral primary osteoarthritis of knee: Secondary | ICD-10-CM | POA: Diagnosis not present

## 2021-10-04 DIAGNOSIS — M542 Cervicalgia: Secondary | ICD-10-CM

## 2021-10-04 DIAGNOSIS — R5383 Other fatigue: Secondary | ICD-10-CM

## 2021-10-04 DIAGNOSIS — M0579 Rheumatoid arthritis with rheumatoid factor of multiple sites without organ or systems involvement: Secondary | ICD-10-CM | POA: Diagnosis not present

## 2021-10-04 DIAGNOSIS — M3509 Sicca syndrome with other organ involvement: Secondary | ICD-10-CM

## 2021-10-04 DIAGNOSIS — I10 Essential (primary) hypertension: Secondary | ICD-10-CM

## 2021-10-04 DIAGNOSIS — Z79899 Other long term (current) drug therapy: Secondary | ICD-10-CM | POA: Diagnosis not present

## 2021-10-04 DIAGNOSIS — Z8261 Family history of arthritis: Secondary | ICD-10-CM

## 2021-10-04 DIAGNOSIS — M19072 Primary osteoarthritis, left ankle and foot: Secondary | ICD-10-CM

## 2021-10-04 DIAGNOSIS — I2510 Atherosclerotic heart disease of native coronary artery without angina pectoris: Secondary | ICD-10-CM

## 2021-10-04 DIAGNOSIS — M722 Plantar fascial fibromatosis: Secondary | ICD-10-CM

## 2021-10-04 DIAGNOSIS — Z8669 Personal history of other diseases of the nervous system and sense organs: Secondary | ICD-10-CM

## 2021-10-04 DIAGNOSIS — I5032 Chronic diastolic (congestive) heart failure: Secondary | ICD-10-CM

## 2021-10-04 DIAGNOSIS — E119 Type 2 diabetes mellitus without complications: Secondary | ICD-10-CM

## 2021-10-04 DIAGNOSIS — R002 Palpitations: Secondary | ICD-10-CM

## 2021-10-04 DIAGNOSIS — M19071 Primary osteoarthritis, right ankle and foot: Secondary | ICD-10-CM

## 2021-10-04 NOTE — Patient Instructions (Addendum)
If you have signs or symptoms of an infection or start antibiotics: First, call your PCP for workup of your infection. Hold your medication through the infection, until you complete your antibiotics, and until symptoms resolve if you take the following: Injectable medication (Actemra, Benlysta, Cimzia, Cosentyx, Enbrel, Humira, Kevzara, Orencia, Remicade, Simponi, Stelara, Taltz, Tremfya) Methotrexate Leflunomide (Arava) Mycophenolate (Cellcept) Morrie Sheldon, Olumiant, or Rinvoq Vaccines You are taking a medication(s) that can suppress your immune system.  The following immunizations are recommended: Flu annually Covid-19  Td/Tdap (tetanus, diphtheria, pertussis) every 10 years Pneumonia (Prevnar 15 then Pneumovax 23 at least 1 year apart.  Alternatively, can take Prevnar 20 without needing additional dose) Shingrix: 2 doses from 4 weeks to 6 months apart  Please check with your PCP to make sure you are up to date.    Standing Labs We placed an order today for your standing lab work.   Please have your standing labs drawn in early-November and every 3 months   Please have your labs drawn 2 weeks prior to your appointment so that the provider can discuss your lab results at your appointment.  Please note that you may see your imaging and lab results in [place] before we have reviewed them. We will contact you once all results are reviewed. Please allow our office up to 72 hours to thoroughly review all of the results before contacting the office for clarification of your results.  Lab hours are: Monday through Thursday from 1:30 pm-4:30 pm and Friday from 1:30 pm- 4:00 pm  You may experience shorter wait times on Monday, Thursday or Friday afternoons,.   Effective October 30, 2021, new lab hours will be: Monday through Thursday from 8:00 am -12:30 pm and 1:00 pm-5:00 pm and Friday from 8:00 am-12:00 pm.  Please be advised, all patients with office appointments requiring lab work will  take precedent over walk-in lab work.   Labs are drawn by [facility]. Please bring your co-pay at the time of your lab draw.  You may receive a bill from [facility] for your lab work.  Please note if you are on Hydroxychloroquine and and an order has been placed for a Hydroxychloroquine level, you will need to have it drawn 4 hours or more after your last dose.  If you wish to have your labs drawn at another location, please call the office 24 hours in advance so we can fax the orders.  The office is located at [address] [place], [place], [place] [address] No appointment is necessary.    If you have any questions regarding directions or hours of operation,  please call [phone].   As a reminder, please drink plenty of water prior to coming for your lab work. Thanks!

## 2021-10-10 ENCOUNTER — Ambulatory Visit: Payer: 59

## 2021-10-18 ENCOUNTER — Encounter: Payer: Self-pay | Admitting: Family

## 2021-10-18 DIAGNOSIS — Z1231 Encounter for screening mammogram for malignant neoplasm of breast: Secondary | ICD-10-CM

## 2021-10-20 ENCOUNTER — Ambulatory Visit (INDEPENDENT_AMBULATORY_CARE_PROVIDER_SITE_OTHER): Payer: 59

## 2021-10-20 DIAGNOSIS — E538 Deficiency of other specified B group vitamins: Secondary | ICD-10-CM

## 2021-10-20 MED ORDER — CYANOCOBALAMIN 1000 MCG/ML IJ SOLN
1000.0000 ug | Freq: Once | INTRAMUSCULAR | Status: AC
  Administered 2021-10-20: 1000 ug via INTRAMUSCULAR

## 2021-10-20 NOTE — Progress Notes (Signed)
Amber [name] is a 61 y.o. female  presents to the office today for a B12 injection, per physician's orders. Original order: per [name], NP   cyanocobalamin, 1000 mg/ml IM was administered in RD deltoid today. This is the third of her monthly B12 injections.   Patient tolerated injection well.    Next appt scheduled 11/17/21 '@9am'$ 

## 2021-11-07 ENCOUNTER — Ambulatory Visit: Payer: 59 | Admitting: Family

## 2021-11-07 ENCOUNTER — Encounter (HOSPITAL_BASED_OUTPATIENT_CLINIC_OR_DEPARTMENT_OTHER): Payer: Self-pay

## 2021-11-07 ENCOUNTER — Ambulatory Visit (HOSPITAL_BASED_OUTPATIENT_CLINIC_OR_DEPARTMENT_OTHER)
Admission: RE | Admit: 2021-11-07 | Discharge: 2021-11-07 | Disposition: A | Discharge status: Home / Self Care | Payer: 59 | Source: Ambulatory Visit | Attending: Family | Admitting: Family

## 2021-11-07 VITALS — BP 145/75 | HR 68 | Temp 98.2°F | Resp 16 | Ht 61.0 in | Wt 213.0 lb

## 2021-11-07 DIAGNOSIS — Z1231 Encounter for screening mammogram for malignant neoplasm of breast: Secondary | ICD-10-CM | POA: Insufficient documentation

## 2021-11-07 DIAGNOSIS — M0579 Rheumatoid arthritis with rheumatoid factor of multiple sites without organ or systems involvement: Secondary | ICD-10-CM

## 2021-11-07 DIAGNOSIS — E785 Hyperlipidemia, unspecified: Secondary | ICD-10-CM

## 2021-11-07 DIAGNOSIS — G43009 Migraine without aura, not intractable, without status migrainosus: Secondary | ICD-10-CM | POA: Diagnosis not present

## 2021-11-07 DIAGNOSIS — I5032 Chronic diastolic (congestive) heart failure: Secondary | ICD-10-CM | POA: Diagnosis not present

## 2021-11-07 DIAGNOSIS — K219 Gastro-esophageal reflux disease without esophagitis: Secondary | ICD-10-CM

## 2021-11-07 DIAGNOSIS — E119 Type 2 diabetes mellitus without complications: Secondary | ICD-10-CM | POA: Diagnosis not present

## 2021-11-07 DIAGNOSIS — Z23 Encounter for immunization: Secondary | ICD-10-CM | POA: Diagnosis not present

## 2021-11-07 DIAGNOSIS — E1142 Type 2 diabetes mellitus with diabetic polyneuropathy: Secondary | ICD-10-CM

## 2021-11-07 DIAGNOSIS — E538 Deficiency of other specified B group vitamins: Secondary | ICD-10-CM | POA: Diagnosis not present

## 2021-11-07 DIAGNOSIS — I10 Essential (primary) hypertension: Secondary | ICD-10-CM | POA: Diagnosis not present

## 2021-11-07 DIAGNOSIS — H409 Unspecified glaucoma: Secondary | ICD-10-CM

## 2021-11-07 DIAGNOSIS — D509 Iron deficiency anemia, unspecified: Secondary | ICD-10-CM

## 2021-11-07 LAB — VITAMIN B12: Vitamin B-12: >1500 pg/mL — ABNORMAL HIGH (ref 211–911)

## 2021-11-07 LAB — LIPID PANEL
Cholesterol: 168 mg/dL (ref 0–200)
HDL: 56.6 mg/dL (ref >39.00)
LDL Cholesterol: 89 mg/dL (ref 0–99)
NonHDL: 111.68
Total CHOL/HDL Ratio: 3
Triglycerides: 114 mg/dL (ref 0.0–149.0)
VLDL: 22.8 mg/dL (ref 0.0–40.0)

## 2021-11-07 LAB — HEMOGLOBIN A1C: Hgb A1c MFr Bld: 6.5 % (ref 4.6–6.5)

## 2021-11-07 MED ORDER — IRON (FERROUS SULFATE) 325 (65 FE) MG PO TABS: 325.0000 mg | ORAL_TABLET | Freq: Two times a day (BID) | ORAL | Status: AC

## 2021-11-07 MED ORDER — VALSARTAN 320 MG PO TABS: 320.0000 mg | ORAL_TABLET | Freq: Every day | ORAL | 1 refills | Status: DC

## 2021-11-07 MED ORDER — ROSUVASTATIN CALCIUM 10 MG PO TABS: 10.0000 mg | ORAL_TABLET | Freq: Every day | ORAL | 1 refills | Status: DC

## 2021-11-07 NOTE — Assessment & Plan Note (Signed)
Lab Results  Component Value Date   HGBA1C 6.5 08/07/2021   HGBA1C 6.1 05/08/2021   HGBA1C 6.2 01/23/2021   Lab Results  Component Value Date   MICROALBUR 3.20 (H) 11/07/2011   LDLCALC 91 05/03/2020   CREATININE 0.75 08/07/2021   A1C at goal on farxiga.

## 2021-11-07 NOTE — Assessment & Plan Note (Signed)
Followed by [facility]- treated with travatan drops- pt reports stable.

## 2021-11-07 NOTE — Patient Instructions (Signed)
Increase valsartan to '320mg'$ .

## 2021-11-07 NOTE — Assessment & Plan Note (Signed)
Reports RA symptoms stable- following with Rheumatology. They are prescribing her orencia.

## 2021-11-07 NOTE — Assessment & Plan Note (Signed)
BP Readings from Last 3 Encounters:  11/07/21 (!) 148/72  10/04/21 (!) 159/92  09/03/21 (!) 149/83   BP elevated- on diovan, metoprolol and hctz.

## 2021-11-07 NOTE — Assessment & Plan Note (Signed)
Check follow up b12 level today. She is now on monthly injections.

## 2021-11-07 NOTE — Assessment & Plan Note (Signed)
Reports that she has about 3 x a month. On Emgality and followed by neurology.

## 2021-11-07 NOTE — Assessment & Plan Note (Signed)
Lab Results  Component Value Date   WBC 5.5 08/07/2021   HGB 12.8 08/07/2021   HCT 39.5 08/07/2021   MCV 85.5 08/07/2021   PLT 250.0 08/07/2021   Cbc stable. Continue iron supplement

## 2021-11-07 NOTE — Assessment & Plan Note (Signed)
Declines gabapentin- did not feel well on gabapentin.

## 2021-11-07 NOTE — Assessment & Plan Note (Signed)
Lab Results  Component Value Date   CHOL 161 05/03/2020   HDL 56.30 05/03/2020   LDLCALC 91 05/03/2020   TRIG 68.0 05/03/2020   CHOLHDL 3 05/03/2020   Not currently crestor.  Repeat lipid panel.

## 2021-11-07 NOTE — Assessment & Plan Note (Signed)
Wt Readings from Last 3 Encounters:  11/07/21 213 lb (96.6 kg)  10/04/21 209 lb (94.8 kg)  09/03/21 210 lb (95.3 kg)   Weight is stable overall.

## 2021-11-07 NOTE — Assessment & Plan Note (Signed)
Stable without medication. Monitor.  

## 2021-11-07 NOTE — Progress Notes (Addendum)
Subjective:   By signing my name below, I, Carylon Perches, attest that this documentation has been prepared under the direction and in the presence of [name], NP 11/07/2021    Patient ID: Amber [name], female    DOB: [birth year], 61 y.o.   MRN: [id number]  Chief Complaint  Patient presents with   Diabetes    Here for follow up   Hypertension    Here for follow up    Diabetes  Hypertension   Patient is in today for an office visit  Refill: She is requesting a refill of her 10 mg of Crestor medication.   Blood Pressure: Her blood pressure during today's visit is elvated. She was originally checking her blood pressure at home but has since discontinued. She denies of any lower extremity swelling at this moment. She is currently taking 25 mg of hctz, 100 mg of Metoprolol Succinate, and 160 mg of Diovan. Her blood pressure after recheck is 145/75. BP Readings from Last 3 Encounters:  11/07/21 (!) 145/75  10/04/21 (!) 159/92  09/03/21 (!) 149/83   Pulse Readings from Last 3 Encounters:  11/07/21 68  10/04/21 66  09/03/21 98   A1C: Her A1C levels are normal. She is currently taking 10 mg of Farxiga Lab Results  Component Value Date   HGBA1C 6.5 08/07/2021   Migraines: She reports of an average of about 3 migraines per month. She is regularly following up with her neurologist. She regularly receives an injection of 120 mg/mL of Emgality  SOB: She reports that she had Covid in August and since then, has experienced some SOB when she is walking   Reflux: She reports that her reflux is controlled   Neuropathy: She states that her pain is persistent. She is not interested in using Gabapentin because it causes her to become "loopy"  Rheumatoid: She reports that her rheumatoid is controlled. She states that she followed up with her rheumatologist in September.  Toenail Fungus: She reports that her toenail fungus is resolved.   Palpitations: She denies of any  recent issues with palpitations   Cholesterol: She is not taking her 10 mg of Crestor. She has discontinued the medication because she had forgotten. Lab Results  Component Value Date   CHOL 161 05/03/2020   HDL 56.30 05/03/2020   LDLCALC 91 05/03/2020   TRIG 68.0 05/03/2020   CHOLHDL 3 05/03/2020   Rashes: She denies of any recent skin rashes.  Blood Count: Her blood count is normal  Mammogram: She reports that she received a mammogram on 11/17/2021  Iron: She is currently taking an iron supplement. She takes one 325 tablet twice a day.    Vitamin B12: She regularly receives Vitamin B12 injections. She is not due for an injection at this time.   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 She is currently using 0.004% of Travatan.   Health Maintenance Due  Topic Date Due   COVID-19 Vaccine (4 - Moderna risk series) 01/22/2020   OPHTHALMOLOGY EXAM  09/13/2021   MAMMOGRAM  11/01/2021    Past Medical History:  Diagnosis Date   Anemia    Arthritis    Asthma 05/03/2020   Blood transfusion without reported diagnosis    CAD (coronary artery disease) 04/24/2017   Cerebrovascular disease 10/13/2020   Chronic diastolic CHF (congestive heart failure) ([place]) 04/24/2017   Colon polyps    Controlled type 2 diabetes mellitus without complication, without long-term current  use of insulin ([place]) 05/31/2017   Diabetic peripheral neuropathy ([place]) 01/05/2020   Ectopic pregnancy    RIGHT salpingostomy   Elective abortion    Elective abortion 09/26/2020   RIGHT salpingostomy   Essential hypertension 09/04/2017   GERD (gastroesophageal reflux disease)    Glaucoma    both eyes   Headache    migraines   History of chicken pox    History of chicken pox 09/26/2020   migraines   Hyperlipemia    Hypertension    Iron deficiency anemia 09/02/2013   Migraine 04/19/2014   Obesity (BMI 30-39.9) 10/17/2012   Palpitations 10/01/2012   Rheumatoid arthritis ([place])  11/07/2011   Rheumatoid arthritis(714.0) 11/07/2011   SAB (spontaneous abortion)    SAB (spontaneous abortion) 11/07/2011   Shortness of breath    Sjogren's disease ([place])    Wheezing     Past Surgical History:  Procedure Laterality Date   ABDOMINAL SURGERY     bowel repair   COLONOSCOPY     DILATION AND CURETTAGE OF UTERUS     X 2   MENISCUS REPAIR Right    MENISCUS TEAR REPAIR   MYOMECTOMY N/A 07/27/2014   Procedure: ABDOMINAL MYOMECTOMY;  Surgeon: Waymon Amato, MD;  Location: [facility];  Service: Gynecology;  Laterality: N/A;   PELVIC LAPAROSCOPY  1994   IN 1999 LAPAROSCOPY WITH INCIDENTAL ENTEROTOMY REQUIRING LAPAROTOMY   ROTATOR CUFF REPAIR Left 06/21/2021   UTERINE FIBROID SURGERY     July 2016    Family History  Problem Relation Age of Onset   Hypertension Mother        died from heart disease   Heart disease Mother        deceased, unknown cause   Stroke Mother    Obesity Mother    Diabetes Sister    Hypertension Sister    Esophageal cancer Sister    Stomach cancer Sister    Cancer Sister    Diabetes Sister    Rectal cancer Neg Hx    Colon cancer Neg Hx     Social History   Socioeconomic History   Marital status: Single    Spouse name: Not on file   Number of children: 0   Years of education: college   Highest education level: Not on file  Occupational History   Occupation: LPN    Employer: [employer]  Tobacco Use   Smoking status: Never    Passive exposure: Never   Smokeless tobacco: Never  Vaping Use   Vaping Use: Never used  Substance and Sexual Activity   Alcohol use: No    Alcohol/week: 0.0 standard drinks of alcohol   Drug use: No   Sexual activity: Yes    Birth control/protection: None    Comment: 1st intercourse- 7, partners- 20  Other Topics Concern   Not on file  Social History Narrative   Regular exercise:  No   Caffeine Use: 2 cups coffee daily   No children   "Happily divorced"   Reports that she is involved at her church   Works  as an Therapist, 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  Had a sister up in [place] who passed away 04-04-2015 who she was close to.   Born [place]- she came to [place] as adult.  Age 62.    Left-handed.         Social Determinants of Health   Financial Resource Strain: Not on file  Food Insecurity: Not on file  Transportation Needs: Not on  file  Physical Activity: Not on file  Stress: Not on file  Social Connections: Not on file  Intimate Partner Violence: Not on file    Outpatient Medications Prior to Visit  Medication Sig Dispense Refill   Abatacept (ORENCIA CLICKJECT) 540 MG/ML SOAJ Inject '125mg'$  into the skin every 10 days. 8 mL 0   albuterol (VENTOLIN HFA) 108 (90 Base) MCG/ACT inhaler Inhale 2 puffs into the lungs every 6 (six) hours as needed. 18 g 0   aspirin EC 81 MG tablet Take 1 tablet (81 mg total) by mouth daily. 90 tablet 3   benzonatate (TESSALON) 100 MG capsule Take 1 capsule (100 mg total) by mouth 3 (three) times daily as needed for cough. 21 capsule 0   calcium-vitamin D (OSCAL WITH D) 500-200 MG-UNIT TABS tablet Take by mouth.     Cyanocobalamin (VITAMIN B-12) 5000 MCG LOZG Take 1 tablet by mouth 2 (two) times daily.     cyclobenzaprine (FLEXERIL) 5 MG tablet Take 0.5 tablets (2.5 mg total) by mouth as needed for muscle spasms. 30 tablet 5   dapagliflozin propanediol (FARXIGA) 10 MG TABS tablet Take 1 tablet (10 mg total) by mouth daily before breakfast. 90 tablet 3   fluticasone (FLONASE) 50 MCG/ACT nasal spray Place 2 sprays into both nostrils daily. 16 g 1   Galcanezumab-gnlm (EMGALITY) 120 MG/ML SOAJ Inject 120 mg into the skin every 30 (thirty) days. 1.12 mL 11   hydrochlorothiazide (HYDRODIURIL) 25 MG tablet Take 1 tablet (25 mg total) by mouth daily. 90 tablet 0   HYDROcodone bit-homatropine (HYCODAN) 5-1.5 MG/5ML syrup Take 5 mLs by mouth every 8 (eight) hours as needed for cough. 120 mL 0   metoprolol succinate (TOPROL-XL) 100 MG 24 hr tablet Take 1 tablet (100 mg total) by mouth  daily. Take with or immediately following a meal. 90 tablet 1   nystatin cream (MYCOSTATIN) Apply 1 Application topically 2 (two) times daily. 30 g 0   SUMAtriptan (IMITREX) 100 MG tablet Take 1 tablet (100 mg total) by mouth as needed for migraine. May repeat in 2 hours if headache persists or recurs. 10 tablet 11   TRAVATAN Z 0.004 % SOLN ophthalmic solution Place 1 drop into both eyes at bedtime.      fluconazole (DIFLUCAN) 150 MG tablet 1 tab po daily and repeat in 5 days if needed 2 tablet 0   valsartan (DIOVAN) 160 MG tablet Take 1 tablet (160 mg total) by mouth daily. 30 tablet 0   rosuvastatin (CRESTOR) 10 MG tablet Take 1 tablet (10 mg total) by mouth daily. 90 tablet 3   No facility-administered medications prior to visit.    Allergies  Allergen Reactions   Gabapentin     Made her "crazy"    Review of Systems  Cardiovascular:  Negative for leg swelling.       Objective:    Physical Exam Constitutional:      General: She is not in acute distress.    Appearance: Normal appearance. She is not ill-appearing.  HENT:     Head: Normocephalic and atraumatic.     Right Ear: External ear normal.     Left Ear: External ear normal.  Eyes:     Extraocular Movements: Extraocular movements intact.     Pupils: Pupils are equal, round, and reactive to light.  Cardiovascular:     Rate and Rhythm: Normal rate and regular rhythm.     Heart sounds: Normal heart sounds. No murmur heard.  No gallop.  Pulmonary:     Effort: Pulmonary effort is normal. No respiratory distress.     Breath sounds: Normal breath sounds. No wheezing or rales.  Skin:    General: Skin is warm and dry.  Neurological:     Mental Status: She is alert and oriented to person, place, and time.  Psychiatric:        Mood and Affect: Mood normal.        Behavior: Behavior normal.        Judgment: Judgment normal.     BP (!) 145/75   Pulse 68   Temp 98.2 F (36.8 C) (Oral)   Resp 16   Ht '5\' 1"'$  (1.549 m)    Wt 213 lb (96.6 kg)   LMP 08/09/2016   SpO2 99%   BMI 40.25 kg/m  Wt Readings from Last 3 Encounters:  11/07/21 213 lb (96.6 kg)  10/04/21 209 lb (94.8 kg)  09/03/21 210 lb (95.3 kg)       Assessment & Plan:   Problem List Items Addressed This Visit       Unprioritized   Rheumatoid arthritis ([place])    Reports RA symptoms stable- following with Rheumatology. They are prescribing her orencia.        Migraine    Reports that she has about 3 x a month. On Emgality and followed by neurology.      Relevant Medications   rosuvastatin (CRESTOR) 10 MG tablet   valsartan (DIOVAN) 320 MG tablet   Iron deficiency anemia    Lab Results  Component Value Date   WBC 5.5 08/07/2021   HGB 12.8 08/07/2021   HCT 39.5 08/07/2021   MCV 85.5 08/07/2021   PLT 250.0 08/07/2021  Cbc stable. Continue iron supplement       Relevant Medications   Iron, Ferrous Sulfate, 325 (65 Fe) MG TABS   Hyperlipidemia    Lab Results  Component Value Date   CHOL 161 05/03/2020   HDL 56.30 05/03/2020   LDLCALC 91 05/03/2020   TRIG 68.0 05/03/2020   CHOLHDL 3 05/03/2020  Not currently crestor.  Repeat lipid panel.       Relevant Medications   rosuvastatin (CRESTOR) 10 MG tablet   valsartan (DIOVAN) 320 MG tablet   Other Relevant Orders   Lipid panel   Glaucoma    Followed by [facility]- treated with travatan drops- pt reports stable.        GERD (gastroesophageal reflux disease)    Stable without medication. Monitor.       Essential hypertension    BP Readings from Last 3 Encounters:  11/07/21 (!) 148/72  10/04/21 (!) 159/92  09/03/21 (!) 149/83  BP elevated- on diovan, metoprolol and hctz.        Relevant Medications   rosuvastatin (CRESTOR) 10 MG tablet   valsartan (DIOVAN) 320 MG tablet   Diabetic peripheral neuropathy (HCC)    Declines gabapentin- did not feel well on gabapentin.       Relevant Medications   rosuvastatin (CRESTOR) 10 MG tablet   valsartan (DIOVAN)  320 MG tablet   Controlled type 2 diabetes mellitus without complication, without long-term current use of insulin (HCC)    Lab Results  Component Value Date   HGBA1C 6.5 08/07/2021   HGBA1C 6.1 05/08/2021   HGBA1C 6.2 01/23/2021   Lab Results  Component Value Date   MICROALBUR 3.20 (H) 11/07/2011   LDLCALC 91 05/03/2020   CREATININE 0.75 08/07/2021  A1C  at goal on farxiga.       Relevant Medications   rosuvastatin (CRESTOR) 10 MG tablet   valsartan (DIOVAN) 320 MG tablet   Other Relevant Orders   Hemoglobin A1c   Chronic diastolic CHF (congestive heart failure) (HCC)    Wt Readings from Last 3 Encounters:  11/07/21 213 lb (96.6 kg)  10/04/21 209 lb (94.8 kg)  09/03/21 210 lb (95.3 kg)  Weight is stable overall.       Relevant Medications   rosuvastatin (CRESTOR) 10 MG tablet   valsartan (DIOVAN) 320 MG tablet   B12 deficiency    Check follow up b12 level today. She is now on monthly injections.       Relevant Orders   B12   Other Visit Diagnoses     Needs flu shot    -  Primary   Relevant Orders   Flu Vaccine QUAD 6+ mos PF IM (Fluarix Quad PF) (Completed)       '@ENCMEDP'$ @  Meds ordered this encounter  Medications   rosuvastatin (CRESTOR) 10 MG tablet    Sig: Take 1 tablet (10 mg total) by mouth daily.    Dispense:  90 tablet    Refill:  1    Order Specific Question:   Supervising Provider    Answer:   Penni Homans A [[id number]]   Iron, Ferrous Sulfate, 325 (65 Fe) MG TABS    Sig: Take 325 mg by mouth 2 (two) times daily.    Dispense:  30 tablet    Order Specific Question:   Supervising Provider    Answer:   Penni Homans A [[id number]]   valsartan (DIOVAN) 320 MG tablet    Sig: Take 1 tablet (320 mg total) by mouth daily.    Dispense:  90 tablet    Refill:  1    Order Specific Question:   Supervising Provider    Answer:   Penni Homans A [[id number]]    I, [name], NP, personally preformed the services described in this documentation.  All medical  record entries made by the scribe were at my direction and in my presence.  I have reviewed the chart and discharge instructions (if applicable) and agree that the record reflects my personal performance and is accurate and complete. 11/07/2021   I,Amber Collins,acting as a scribe for [name], NP.,have documented all relevant documentation on the behalf of [name], NP,as directed by  [name], NP while in the presence of [name], NP.    [name], NP

## 2021-11-14 ENCOUNTER — Ambulatory Visit: Payer: 59 | Admitting: Family

## 2021-11-14 ENCOUNTER — Other Ambulatory Visit: Payer: Self-pay | Admitting: Family

## 2021-11-14 VITALS — BP 140/69 | HR 67 | Temp 98.2°F | Resp 16 | Wt 214.0 lb

## 2021-11-14 DIAGNOSIS — I10 Essential (primary) hypertension: Secondary | ICD-10-CM

## 2021-11-14 LAB — BASIC METABOLIC PANEL
BUN: 9 mg/dL (ref 6–23)
CO2: 31 mEq/L (ref 19–32)
Calcium: 9.5 mg/dL (ref 8.4–10.5)
Chloride: 106 mEq/L (ref 96–112)
Creatinine, Ser: 0.69 mg/dL (ref 0.40–1.20)
GFR: 94.36 mL/min (ref >60.00)
Glucose, Bld: 95 mg/dL (ref 70–99)
Potassium: 4 mEq/L (ref 3.5–5.1)
Sodium: 141 mEq/L (ref 135–145)

## 2021-11-14 MED ORDER — CARVEDILOL 12.5 MG PO TABS: 12.5000 mg | ORAL_TABLET | Freq: Two times a day (BID) | ORAL | 5 refills | Status: DC

## 2021-11-14 MED ORDER — SUMATRIPTAN SUCCINATE 100 MG PO TABS: 100.0000 mg | ORAL_TABLET | ORAL | 11 refills | Status: DC | PRN

## 2021-11-14 NOTE — Assessment & Plan Note (Signed)
BP Readings from Last 3 Encounters:  11/14/21 (!) 140/69  11/07/21 (!) 145/75  10/04/21 (!) 159/92   Improving but still above goal. Will change metoprolol to carvedilol, continue diovan and hctz.

## 2021-11-14 NOTE — Progress Notes (Signed)
Subjective:   By signing my name below, I, Carylon Perches, attest that this documentation has been prepared under the direction and in the presence of Karie Chimera, NP 11/14/2021     Patient ID: Amber [name], female    DOB: June 03, 1960, 61 y.o.   MRN: [id number]  Chief Complaint  Patient presents with   Hypertension    Here for follow up    HPI Patient is in today for an office visit   Blood Pressure: Since last visit, her Valsartan medication was increased from 160 mg to 320 mg. Since then, her blood pressure has decreased slightly but is not at goal yet. BP Readings from Last 3 Encounters:  11/14/21 (!) 140/69  11/07/21 (!) 145/75  10/04/21 (!) 159/92   Pulse Readings from Last 3 Encounters:  11/14/21 67  11/07/21 68  10/04/21 66    Health Maintenance Due  Topic Date Due   COVID-19 Vaccine (4 - Moderna risk series) 01/22/2020   OPHTHALMOLOGY EXAM  09/13/2021    Past Medical History:  Diagnosis Date   Anemia    Arthritis    Asthma 05/03/2020   Blood transfusion without reported diagnosis    CAD (coronary artery disease) 04/24/2017   Cerebrovascular disease 10/13/2020   Chronic diastolic CHF (congestive heart failure) ([place]) 04/24/2017   Colon polyps    Controlled type 2 diabetes mellitus without complication, without long-term current use of insulin ([place]) 05/31/2017   Diabetic peripheral neuropathy ([place]) 01/05/2020   Ectopic pregnancy    RIGHT salpingostomy   Elective abortion    Elective abortion 09/26/2020   RIGHT salpingostomy   Essential hypertension 09/04/2017   GERD (gastroesophageal reflux disease)    Glaucoma    both eyes   Headache    migraines   History of chicken pox    History of chicken pox 09/26/2020   migraines   Hyperlipemia    Hypertension    Iron deficiency anemia 09/02/2013   Migraine 04/19/2014   Obesity (BMI 30-39.9) 10/17/2012   Palpitations 10/01/2012   Rheumatoid arthritis ([place]) 11/07/2011   Rheumatoid arthritis(714.0)  11/07/2011   SAB (spontaneous abortion)    SAB (spontaneous abortion) 11/07/2011   Shortness of breath    Sjogren's disease ([place])    Wheezing     Past Surgical History:  Procedure Laterality Date   ABDOMINAL SURGERY     bowel repair   COLONOSCOPY     DILATION AND CURETTAGE OF UTERUS     X 2   MENISCUS REPAIR Right    MENISCUS TEAR REPAIR   MYOMECTOMY N/A 07/27/2014   Procedure: ABDOMINAL MYOMECTOMY;  Surgeon: Waymon Amato, MD;  Location: [facility];  Service: Gynecology;  Laterality: N/A;   PELVIC LAPAROSCOPY  1994   IN 1999 LAPAROSCOPY WITH INCIDENTAL ENTEROTOMY REQUIRING LAPAROTOMY   ROTATOR CUFF REPAIR Left 06/21/2021   UTERINE FIBROID SURGERY     July 2016    Family History  Problem Relation Age of Onset   Hypertension Mother        died from heart disease   Heart disease Mother        deceased, unknown cause   Stroke Mother    Obesity Mother    Diabetes Sister    Hypertension Sister    Esophageal cancer Sister    Stomach cancer Sister    Cancer Sister    Diabetes Sister    Rectal cancer Neg Hx    Colon cancer Neg Hx     Social History  Socioeconomic History   Marital status: Single    Spouse name: Not on file   Number of children: 0   Years of education: college   Highest education level: Not on file  Occupational History   Occupation: LPN    Employer: [employer]  Tobacco Use   Smoking status: Never    Passive exposure: Never   Smokeless tobacco: Never  Vaping Use   Vaping Use: Never used  Substance and Sexual Activity   Alcohol use: No    Alcohol/week: 0.0 standard drinks of alcohol   Drug use: No   Sexual activity: Yes    Birth control/protection: None    Comment: 1st intercourse- 27, partners- 36  Other Topics Concern   Not on file  Social History Narrative   Regular exercise:  No   Caffeine Use: 2 cups coffee daily   No children   "Happily divorced"   Reports that she is involved at her church   Works as an Therapist, 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   Had a sister up in [place] who passed away March 23, 2015 who she was close to.   Born [place]- she came to [place] as adult.  Age 89.    Left-handed.         Social Determinants of Health   Financial Resource Strain: Not on file  Food Insecurity: Not on file  Transportation Needs: Not on file  Physical Activity: Not on file  Stress: Not on file  Social Connections: Not on file  Intimate Partner Violence: Not on file    Outpatient Medications Prior to Visit  Medication Sig Dispense Refill   Abatacept (ORENCIA CLICKJECT) 678 MG/ML SOAJ Inject '125mg'$  into the skin every 10 days. 8 mL 0   albuterol (VENTOLIN HFA) 108 (90 Base) MCG/ACT inhaler Inhale 2 puffs into the lungs every 6 (six) hours as needed. 18 g 0   aspirin EC 81 MG tablet Take 1 tablet (81 mg total) by mouth daily. 90 tablet 3   benzonatate (TESSALON) 100 MG capsule Take 1 capsule (100 mg total) by mouth 3 (three) times daily as needed for cough. 21 capsule 0   calcium-vitamin D (OSCAL WITH D) 500-200 MG-UNIT TABS tablet Take by mouth.     Cyanocobalamin (VITAMIN B-12) 5000 MCG LOZG Take 1 tablet by mouth 2 (two) times daily.     cyclobenzaprine (FLEXERIL) 5 MG tablet Take 0.5 tablets (2.5 mg total) by mouth as needed for muscle spasms. 30 tablet 5   dapagliflozin propanediol (FARXIGA) 10 MG TABS tablet Take 1 tablet (10 mg total) by mouth daily before breakfast. 90 tablet 3   fluticasone (FLONASE) 50 MCG/ACT nasal spray Place 2 sprays into both nostrils daily. 16 g 1   Galcanezumab-gnlm (EMGALITY) 120 MG/ML SOAJ Inject 120 mg into the skin every 30 (thirty) days. 1.12 mL 11   hydrochlorothiazide (HYDRODIURIL) 25 MG tablet Take 1 tablet (25 mg total) by mouth daily. 90 tablet 0   HYDROcodone bit-homatropine (HYCODAN) 5-1.5 MG/5ML syrup Take 5 mLs by mouth every 8 (eight) hours as needed for cough. 120 mL 0   Iron, Ferrous Sulfate, 325 (65 Fe) MG TABS Take 325 mg by mouth 2 (two) times daily. 30 tablet    nystatin cream (MYCOSTATIN) Apply  1 Application topically 2 (two) times daily. 30 g 0   rosuvastatin (CRESTOR) 10 MG tablet Take 1 tablet (10 mg total) by mouth daily. 90 tablet 1   TRAVATAN Z 0.004 % SOLN ophthalmic solution Place 1 drop  into both eyes at bedtime.      valsartan (DIOVAN) 320 MG tablet Take 1 tablet (320 mg total) by mouth daily. 90 tablet 1   metoprolol succinate (TOPROL-XL) 100 MG 24 hr tablet TAKE 1 TABLET BY MOUTH ONCE DAILY WITH MEALS OR  IMMEDIATELY  AFTER 90 tablet 0   SUMAtriptan (IMITREX) 100 MG tablet Take 1 tablet (100 mg total) by mouth as needed for migraine. May repeat in 2 hours if headache persists or recurs. 10 tablet 11   No facility-administered medications prior to visit.    Allergies  Allergen Reactions   Gabapentin     Made her "crazy"    ROS     Objective:    Physical Exam Constitutional:      General: She is not in acute distress.    Appearance: Normal appearance. She is not ill-appearing.  HENT:     Head: Normocephalic and atraumatic.     Right Ear: External ear normal.     Left Ear: External ear normal.  Eyes:     Extraocular Movements: Extraocular movements intact.     Pupils: Pupils are equal, round, and reactive to light.  Cardiovascular:     Rate and Rhythm: Normal rate and regular rhythm.     Heart sounds: Normal heart sounds. No murmur heard.    No gallop.  Pulmonary:     Effort: Pulmonary effort is normal. No respiratory distress.     Breath sounds: Normal breath sounds. No wheezing or rales.  Skin:    General: Skin is warm and dry.  Neurological:     Mental Status: She is alert and oriented to person, place, and time.  Psychiatric:        Mood and Affect: Mood normal.        Behavior: Behavior normal.        Judgment: Judgment normal.     BP (!) 140/69   Pulse 67   Temp 98.2 F (36.8 C) (Oral)   Resp 16   Wt 214 lb (97.1 kg)   LMP 08/09/2016   SpO2 99%   BMI 40.43 kg/m  Wt Readings from Last 3 Encounters:  11/14/21 214 lb (97.1 kg)   11/07/21 213 lb (96.6 kg)  10/04/21 209 lb (94.8 kg)       Assessment & Plan:   Problem List Items Addressed This Visit       Unprioritized   Essential hypertension - Primary    BP Readings from Last 3 Encounters:  11/14/21 (!) 140/69  11/07/21 (!) 145/75  10/04/21 (!) 159/92  Improving but still above goal. Will change metoprolol to carvedilol, continue diovan and hctz.       Relevant Medications   carvedilol (COREG) 12.5 MG tablet   Other Relevant Orders   Basic metabolic panel   Meds ordered this encounter  Medications   SUMAtriptan (IMITREX) 100 MG tablet    Sig: Take 1 tablet (100 mg total) by mouth as needed for migraine. May repeat in 2 hours if headache persists or recurs.    Dispense:  10 tablet    Refill:  11   carvedilol (COREG) 12.5 MG tablet    Sig: Take 1 tablet (12.5 mg total) by mouth 2 (two) times daily with a meal.    Dispense:  60 tablet    Refill:  5    Order Specific Question:   Supervising Provider    Answer:   Mosie Lukes [[id number]]    I, Wellington Hampshire  Inda Castle, NP, personally preformed the services described in this documentation.  All medical record entries made by the scribe were at my direction and in my presence.  I have reviewed the chart and discharge instructions (if applicable) and agree that the record reflects my personal performance and is accurate and complete. 11/14/2021   I,Amber Collins,acting as a scribe for [name], NP.,have documented all relevant documentation on the behalf of [name], NP,as directed by  [name], NP while in the presence of [name], NP.    [name], NP

## 2021-11-20 ENCOUNTER — Encounter: Payer: Self-pay | Admitting: Family

## 2021-11-22 ENCOUNTER — Ambulatory Visit: Payer: 59

## 2021-11-28 ENCOUNTER — Other Ambulatory Visit: Payer: Self-pay | Admitting: Family Medicine

## 2021-11-28 ENCOUNTER — Ambulatory Visit: Payer: 59 | Admitting: Family

## 2021-11-28 VITALS — BP 149/79 | HR 67 | Temp 98.7°F | Resp 16 | Wt 216.0 lb

## 2021-11-28 DIAGNOSIS — E538 Deficiency of other specified B group vitamins: Secondary | ICD-10-CM

## 2021-11-28 DIAGNOSIS — R221 Localized swelling, mass and lump, neck: Secondary | ICD-10-CM | POA: Diagnosis not present

## 2021-11-28 DIAGNOSIS — I10 Essential (primary) hypertension: Secondary | ICD-10-CM

## 2021-11-28 MED ORDER — CARVEDILOL 25 MG PO TABS: 25.0000 mg | ORAL_TABLET | Freq: Two times a day (BID) | ORAL | 1 refills | Status: DC

## 2021-11-28 MED ORDER — CYANOCOBALAMIN 1000 MCG/ML IJ SOLN
1000.0000 ug | Freq: Once | INTRAMUSCULAR | Status: AC
  Administered 2021-11-28: 1000 ug via INTRAMUSCULAR

## 2021-11-28 NOTE — Assessment & Plan Note (Addendum)
Last visit we changed metoprolol to carvedilol.  BP Readings from Last 3 Encounters:  11/28/21 (!) 149/79  11/14/21 (!) 140/69  11/07/21 (!) 145/75   Still uncontrolled despite above change. Will increase carvedilolol from 12.'5mg'$  bid to '25mg'$  bid. Continue current dose of valsartan and hctz.

## 2021-11-28 NOTE — Progress Notes (Addendum)
Subjective:   By signing my name below, I, Carylon Perches, attest that this documentation has been prepared under the direction and in the presence of Karie Chimera, NP 11/28/2021     Patient ID: Amber [name], female    DOB: 07-09-1960, 60 y.o.   MRN: [id number]  Chief Complaint  Patient presents with   Hypertension    Here for follow up    HPI Patient is in today for an office visit  Blood Pressure: Since last visit, she is no longer taking 100 mg of Metoprolol Succinate and is now taking 12.5 mg of Carvedilol. As of today's visit, her blood pressure is not improving. She is continuing to take her Carvedilol medication as well as her 25 mg of Hydrochlorothiazide and 320 mg of Valsartan.   BP Readings from Last 3 Encounters:  11/28/21 (!) 149/79  11/14/21 (!) 140/69  11/07/21 (!) 145/75   Pulse Readings from Last 3 Encounters:  11/28/21 67  11/14/21 67  11/07/21 68   Swelling Left Shoulder: She states that the swelling in her left shoulder is persistent since June. She was suggested to be seen by an ENT specialist by her orthopedic surgeon   Vitamin B12 Injections: She is requesting to receive vitamin B12 injections  Health Maintenance Due  Topic Date Due   COVID-19 Vaccine (4 - 2023-24 season) 09/01/2021   OPHTHALMOLOGY EXAM  09/13/2021    Past Medical History:  Diagnosis Date   Anemia    Arthritis    Asthma 05/03/2020   Blood transfusion without reported diagnosis    CAD (coronary artery disease) 04/24/2017   Cerebrovascular disease 10/13/2020   Chronic diastolic CHF (congestive heart failure) ([place]) 04/24/2017   Colon polyps    Controlled type 2 diabetes mellitus without complication, without long-term current use of insulin ([place]) 05/31/2017   Diabetic peripheral neuropathy ([place]) 01/05/2020   Ectopic pregnancy    RIGHT salpingostomy   Elective abortion    Elective abortion 09/26/2020   RIGHT salpingostomy   Essential hypertension 09/04/2017   GERD  (gastroesophageal reflux disease)    Glaucoma    both eyes   Headache    migraines   History of chicken pox    History of chicken pox 09/26/2020   migraines   Hyperlipemia    Hypertension    Iron deficiency anemia 09/02/2013   Migraine 04/19/2014   Obesity (BMI 30-39.9) 10/17/2012   Palpitations 10/01/2012   Rheumatoid arthritis ([place]) 11/07/2011   Rheumatoid arthritis(714.0) 11/07/2011   SAB (spontaneous abortion)    SAB (spontaneous abortion) 11/07/2011   Shortness of breath    Sjogren's disease ([place])    Wheezing     Past Surgical History:  Procedure Laterality Date   ABDOMINAL SURGERY     bowel repair   COLONOSCOPY     DILATION AND CURETTAGE OF UTERUS     X 2   MENISCUS REPAIR Right    MENISCUS TEAR REPAIR   MYOMECTOMY N/A 07/27/2014   Procedure: ABDOMINAL MYOMECTOMY;  Surgeon: Waymon Amato, MD;  Location: [facility];  Service: Gynecology;  Laterality: N/A;   PELVIC LAPAROSCOPY  1994   IN 1999 LAPAROSCOPY WITH INCIDENTAL ENTEROTOMY REQUIRING LAPAROTOMY   ROTATOR CUFF REPAIR Left 06/21/2021   UTERINE FIBROID SURGERY     July 2016    Family History  Problem Relation Age of Onset   Hypertension Mother        died from heart disease   Heart disease Mother  deceased, unknown cause   Stroke Mother    Obesity Mother    Diabetes Sister    Hypertension Sister    Esophageal cancer Sister    Stomach cancer Sister    Cancer Sister    Diabetes Sister    Rectal cancer Neg Hx    Colon cancer Neg Hx     Social History   Socioeconomic History   Marital status: Single    Spouse name: Not on file   Number of children: 0   Years of education: college   Highest education level: Not on file  Occupational History   Occupation: LPN    Employer: [employer]  Tobacco Use   Smoking status: Never    Passive exposure: Never   Smokeless tobacco: Never  Vaping Use   Vaping Use: Never used  Substance and Sexual Activity   Alcohol use: No    Alcohol/week: 0.0 standard drinks of  alcohol   Drug use: No   Sexual activity: Yes    Birth control/protection: None    Comment: 1st intercourse- 35, partners- 42  Other Topics Concern   Not on file  Social History Narrative   Regular exercise:  No   Caffeine Use: 2 cups coffee daily   No children   "Happily divorced"   Reports that she is involved at her church   Works as an Therapist, 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  Had a sister up in [place] who passed away 2015/04/12 who she was close to.   Born in [place]- she came to [place] as adult.  Age 29.    Left-handed.         Social Determinants of Health   Financial Resource Strain: Not on file  Food Insecurity: Not on file  Transportation Needs: Not on file  Physical Activity: Not on file  Stress: Not on file  Social Connections: Not on file  Intimate Partner Violence: Not on file    Outpatient Medications Prior to Visit  Medication Sig Dispense Refill   Abatacept (ORENCIA CLICKJECT) 938 MG/ML SOAJ Inject '125mg'$  into the skin every 10 days. 8 mL 0   albuterol (VENTOLIN HFA) 108 (90 Base) MCG/ACT inhaler Inhale 2 puffs into the lungs every 6 (six) hours as needed. 18 g 0   aspirin EC 81 MG tablet Take 1 tablet (81 mg total) by mouth daily. 90 tablet 3   benzonatate (TESSALON) 100 MG capsule Take 1 capsule (100 mg total) by mouth 3 (three) times daily as needed for cough. 21 capsule 0   calcium-vitamin D (OSCAL WITH D) 500-200 MG-UNIT TABS tablet Take by mouth.     Cyanocobalamin (VITAMIN B-12) 5000 MCG LOZG Take 1 tablet by mouth 2 (two) times daily.     cyclobenzaprine (FLEXERIL) 5 MG tablet Take 0.5 tablets (2.5 mg total) by mouth as needed for muscle spasms. 30 tablet 5   dapagliflozin propanediol (FARXIGA) 10 MG TABS tablet Take 1 tablet (10 mg total) by mouth daily before breakfast. 90 tablet 3   fluticasone (FLONASE) 50 MCG/ACT nasal spray Place 2 sprays into both nostrils daily. 16 g 1   Galcanezumab-gnlm (EMGALITY) 120 MG/ML SOAJ Inject 120 mg into the skin every 30 (thirty)  days. 1.12 mL 11   hydrochlorothiazide (HYDRODIURIL) 25 MG tablet Take 1 tablet (25 mg total) by mouth daily. 90 tablet 0   HYDROcodone bit-homatropine (HYCODAN) 5-1.5 MG/5ML syrup Take 5 mLs by mouth every 8 (eight) hours as needed for cough. 120 mL 0   Iron,  Ferrous Sulfate, 325 (65 Fe) MG TABS Take 325 mg by mouth 2 (two) times daily. 30 tablet    nystatin cream (MYCOSTATIN) Apply 1 Application topically 2 (two) times daily. 30 g 0   rosuvastatin (CRESTOR) 10 MG tablet Take 1 tablet (10 mg total) by mouth daily. 90 tablet 1   SUMAtriptan (IMITREX) 100 MG tablet Take 1 tablet (100 mg total) by mouth as needed for migraine. May repeat in 2 hours if headache persists or recurs. 10 tablet 11   TRAVATAN Z 0.004 % SOLN ophthalmic solution Place 1 drop into both eyes at bedtime.      valsartan (DIOVAN) 320 MG tablet Take 1 tablet (320 mg total) by mouth daily. 90 tablet 1   carvedilol (COREG) 12.5 MG tablet Take 1 tablet (12.5 mg total) by mouth 2 (two) times daily with a meal. 60 tablet 5   No facility-administered medications prior to visit.    Allergies  Allergen Reactions   Gabapentin     Made her "crazy"    ROS    See HPI Objective:    Physical Exam Constitutional:      General: She is not in acute distress.    Appearance: Normal appearance. She is not ill-appearing.  HENT:     Head: Normocephalic and atraumatic.     Right Ear: External ear normal.     Left Ear: External ear normal.  Eyes:     Extraocular Movements: Extraocular movements intact.     Pupils: Pupils are equal, round, and reactive to light.  Cardiovascular:     Rate and Rhythm: Normal rate and regular rhythm.     Heart sounds: Normal heart sounds. No murmur heard.    No gallop.  Pulmonary:     Effort: Pulmonary effort is normal. No respiratory distress.     Breath sounds: Normal breath sounds. No wheezing or rales.  Musculoskeletal:     Comments: Supraclavicular swelling noted left  Skin:    General: Skin  is warm and dry.  Neurological:     Mental Status: She is alert and oriented to person, place, and time.  Psychiatric:        Mood and Affect: Mood normal.        Behavior: Behavior normal.        Judgment: Judgment normal.     BP (!) 149/79   Pulse 67   Temp 98.7 F (37.1 C) (Oral)   Resp 16   Wt 216 lb (98 kg)   LMP 08/09/2016   SpO2 99%   BMI 40.81 kg/m  Wt Readings from Last 3 Encounters:  11/28/21 216 lb (98 kg)  11/14/21 214 lb (97.1 kg)  11/07/21 213 lb (96.6 kg)       Assessment & Plan:   Problem List Items Addressed This Visit       Unprioritized   Neck swelling    New.  Has been present since she had rotator cuff surgery. Supraclavicular US showed normal sized supraclavicular lymph nodes.  Reports that ortho recommended that she see ENT. Will obtain CT neck to further evaluate. If any concerning findings, plan referral to ENT at that time.  I suspect that this is most likely bening swelling that is reactive to her surgery/injury and hx of RA.       Relevant Orders   CT Soft Tissue Neck W Contrast   Essential hypertension - Primary    Last visit we changed metoprolol to carvedilol.  BP Readings from Last  3 Encounters:  11/28/21 (!) 149/79  11/14/21 (!) 140/69  11/07/21 (!) 145/75  Still uncontrolled despite above change. Will increase carvedilolol from 12.'5mg'$  bid to '25mg'$  bid. Continue current dose of valsartan and hctz.        Relevant Medications   carvedilol (COREG) 25 MG tablet   B12 deficiency    B12 injection today and continue monthly.       Meds ordered this encounter  Medications   carvedilol (COREG) 25 MG tablet    Sig: Take 1 tablet (25 mg total) by mouth 2 (two) times daily with a meal.    Dispense:  180 tablet    Refill:  1    Order Specific Question:   Supervising Provider    Answer:   Penni Homans A [[id number]]   cyanocobalamin (VITAMIN B12) injection 1,000 mcg    I, [name], NP, personally preformed the services  described in this documentation.  All medical record entries made by the scribe were at my direction and in my presence.  I have reviewed the chart and discharge instructions (if applicable) and agree that the record reflects my personal performance and is accurate and complete. 11/28/2021   I,Amber Collins,acting as a scribe for [name], NP.,have documented all relevant documentation on the behalf of [name], NP,as directed by  [name], NP while in the presence of [name], NP.    [name], NP

## 2021-11-28 NOTE — Assessment & Plan Note (Signed)
B12 injection today and continue monthly.  

## 2021-11-28 NOTE — Assessment & Plan Note (Signed)
New.  Has been present since she had rotator cuff surgery. Supraclavicular US showed normal sized supraclavicular lymph nodes.  Reports that ortho recommended that she see ENT. Will obtain CT neck to further evaluate. If any concerning findings, plan referral to ENT at that time.  I suspect that this is most likely bening swelling that is reactive to her surgery/injury and hx of RA.

## 2021-11-29 ENCOUNTER — Encounter: Payer: Self-pay | Admitting: *Deleted

## 2021-12-04 ENCOUNTER — Other Ambulatory Visit: Payer: Self-pay | Admitting: *Deleted

## 2021-12-04 MED ORDER — ORENCIA CLICKJECT 125 MG/ML ~~LOC~~ SOAJ: SUBCUTANEOUS | 0 refills | Status: DC

## 2021-12-04 NOTE — Telephone Encounter (Signed)
Next Visit: 03/08/2022  Last Visit: 10/04/2021  Last Fill: 09/18/2021  DX: Rheumatoid arthritis involving multiple sites with positive rheumatoid factor   Current Dose per office note 10/04/2021: Orencia 125 mg subcutaneous every 10 days   Labs: 11/14/2021 BMP Glucose 156 08/07/2021 CBC normal  TB Gold: 01/26/2021   TB gold negative   Okay to refill Orencia?

## 2021-12-05 ENCOUNTER — Ambulatory Visit (HOSPITAL_BASED_OUTPATIENT_CLINIC_OR_DEPARTMENT_OTHER)
Admission: RE | Admit: 2021-12-05 | Discharge: 2021-12-05 | Disposition: A | Discharge status: Home / Self Care | Payer: 59 | Source: Ambulatory Visit | Attending: Family | Admitting: Family

## 2021-12-05 DIAGNOSIS — R221 Localized swelling, mass and lump, neck: Secondary | ICD-10-CM | POA: Insufficient documentation

## 2021-12-05 MED ORDER — IOHEXOL 300 MG/ML  SOLN
75.0000 mL | Freq: Once | INTRAMUSCULAR | Status: AC | PRN
  Administered 2021-12-05: 75 mL via INTRAVENOUS

## 2021-12-11 LAB — HM DIABETES EYE EXAM

## 2021-12-13 LAB — HM DIABETES EYE EXAM

## 2021-12-18 ENCOUNTER — Ambulatory Visit: Payer: 59 | Admitting: Family Medicine

## 2021-12-18 VITALS — BP 134/80 | HR 80 | Temp 98.0°F | Resp 16 | Ht 61.0 in | Wt 212.0 lb

## 2021-12-18 DIAGNOSIS — E119 Type 2 diabetes mellitus without complications: Secondary | ICD-10-CM

## 2021-12-18 DIAGNOSIS — I10 Essential (primary) hypertension: Secondary | ICD-10-CM | POA: Diagnosis not present

## 2021-12-18 DIAGNOSIS — E669 Obesity, unspecified: Secondary | ICD-10-CM

## 2021-12-18 DIAGNOSIS — R059 Cough, unspecified: Secondary | ICD-10-CM | POA: Insufficient documentation

## 2021-12-18 LAB — POCT INFLUENZA A/B

## 2021-12-18 LAB — POC COVID19 BINAXNOW: SARS Coronavirus 2 Ag: NEGATIVE

## 2021-12-18 MED ORDER — DOXYCYCLINE HYCLATE 100 MG PO TABS: 100.0000 mg | ORAL_TABLET | Freq: Two times a day (BID) | ORAL | 0 refills | Status: DC

## 2021-12-18 MED ORDER — METHYLPREDNISOLONE 4 MG PO TABS: ORAL_TABLET | ORAL | 0 refills | Status: DC

## 2021-12-18 MED ORDER — PROMETHAZINE-DM 6.25-15 MG/5ML PO SYRP: 2.5000 mL | ORAL_SOLUTION | Freq: Three times a day (TID) | ORAL | 0 refills | Status: DC | PRN

## 2021-12-18 NOTE — Patient Instructions (Addendum)
Plain mucinex twice daily and increase hydration  Acute Bronchitis, Adult  Acute bronchitis is sudden inflammation of the main airways (bronchi) that come off the windpipe (trachea) in the lungs. The swelling causes the airways to get smaller and make more mucus than normal. This can make it hard to breathe and can cause coughing or noisy breathing (wheezing). Acute bronchitis may last several weeks. The cough may last longer. Allergies, asthma, and exposure to smoke may make the condition worse. What are the causes? This condition can be caused by germs and by substances that irritate the lungs, including: Cold and flu viruses. The most common cause of this condition is the virus that causes the common cold. Bacteria. This is less common. Breathing in substances that irritate the lungs, including: Smoke from cigarettes and other forms of tobacco. Dust and pollen. Fumes from household cleaning products, gases, or burned fuel. Indoor or outdoor air pollution. What increases the risk? The following factors may make you more likely to develop this condition: A weak body's defense system, also called the immune system. A condition that affects your lungs and breathing, such as asthma. What are the signs or symptoms? Common symptoms of this condition include: Coughing. This may bring up clear, yellow, or green mucus from your lungs (sputum). Wheezing. Runny or stuffy nose. Having too much mucus in your lungs (chest congestion). Shortness of breath. Aches and pains, including sore throat or chest. How is this diagnosed? This condition is usually diagnosed based on: Your symptoms and medical history. A physical exam. You may also have other tests, including tests to rule out other conditions, such as pneumonia. These tests include: A test of lung function. Test of a mucus sample to look for the presence of bacteria. Tests to check the oxygen level in your blood. Blood tests. Chest  X-ray. How is this treated? Most cases of acute bronchitis clear up over time without treatment. Your health care provider may recommend: Drinking more fluids to help thin your mucus so it is easier to cough up. Taking inhaled medicine (inhaler) to improve air flow in and out of your lungs. Using a vaporizer or a humidifier. These are machines that add water to the air to help you breathe better. Taking a medicine that thins mucus and clears congestion (expectorant). Taking a medicine that prevents or stops coughing (cough suppressant). It is not common to take an antibiotic medicine for this condition. Follow these instructions at home:  Take over-the-counter and prescription medicines only as told by your health care provider. Use an inhaler, vaporizer, or humidifier as told by your health care provider. Take two teaspoons (10 mL) of honey at bedtime to lessen coughing at night. Drink enough fluid to keep your urine pale yellow. Do not use any products that contain nicotine or tobacco. These products include cigarettes, chewing tobacco, and vaping devices, such as e-cigarettes. If you need help quitting, ask your health care provider. Get plenty of rest. Return to your normal activities as told by your health care provider. Ask your health care provider what activities are safe for you. Keep all follow-up visits. This is important. How is this prevented? To lower your risk of getting this condition again: Wash your hands often with soap and water for at least 20 seconds. If soap and water are not available, use hand sanitizer. Avoid contact with people who have cold symptoms. Try not to touch your mouth, nose, or eyes with your hands. Avoid breathing in smoke or chemical  fumes. Breathing smoke or chemical fumes will make your condition worse. Get the flu shot every year. Contact a health care provider if: Your symptoms do not improve after 2 weeks. You have trouble coughing up the  mucus. Your cough keeps you awake at night. You have a fever. Get help right away if you: Cough up blood. Feel pain in your chest. Have severe shortness of breath. Faint or keep feeling like you are going to faint. Have a severe headache. Have a fever or chills that get worse. These symptoms may represent a serious problem that is an emergency. Do not wait to see if the symptoms will go away. Get medical help right away. Call your local emergency services (911 in the [place] Do not drive yourself to the hospital. Summary Acute bronchitis is inflammation of the main airways (bronchi) that come off the windpipe (trachea) in the lungs. The swelling causes the airways to get smaller and make more mucus than normal. Drinking more fluids can help thin your mucus so it is easier to cough up. Take over-the-counter and prescription medicines only as told by your health care provider. Do not use any products that contain nicotine or tobacco. These products include cigarettes, chewing tobacco, and vaping devices, such as e-cigarettes. If you need help quitting, ask your health care provider. Contact a health care provider if your symptoms do not improve after 2 weeks. This information is not intended to replace advice given to you by your health care provider. Make sure you discuss any questions you have with your health care provider. Document Revised: 03/30/2021 Document Reviewed: 04/20/2020 Elsevier Patient Education  [place].

## 2021-12-18 NOTE — Progress Notes (Signed)
Subjective:    Patient ID: Amber [name], female    DOB: 11-10-1960, 61 y.o.   MRN: [id number]  Chief Complaint  Patient presents with   Cough    Here for Cough and Congestion    HPI Patient is in today for evaluation of a persistent cough x 2 weeks. She has been ill for roughly 2 weeks. Notes some head and chest congestion cough is keeping her up. She struggles with malaise, fatigue and headache. 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 She denies any GI symptoms, CP or palpitations. Notes some Shortness of Breath on the stairs recently and with excessive talking. D  Past Medical History:  Diagnosis Date   Anemia    Arthritis    Asthma 05/03/2020   Blood transfusion without reported diagnosis    CAD (coronary artery disease) 04/24/2017   Cerebrovascular disease 10/13/2020   Chronic diastolic CHF (congestive heart failure) ([place]) 04/24/2017   Colon polyps    Controlled type 2 diabetes mellitus without complication, without long-term current use of insulin ([place]) 05/31/2017   Diabetic peripheral neuropathy ([place]) 01/05/2020   Ectopic pregnancy    RIGHT salpingostomy   Elective abortion    Elective abortion 09/26/2020   RIGHT salpingostomy   Essential hypertension 09/04/2017   GERD (gastroesophageal reflux disease)    Glaucoma    both eyes   Headache    migraines   History of chicken pox    History of chicken pox 09/26/2020   migraines   Hyperlipemia    Hypertension    Iron deficiency anemia 09/02/2013   Migraine 04/19/2014   Obesity (BMI 30-39.9) 10/17/2012   Palpitations 10/01/2012   Rheumatoid arthritis ([place]) 11/07/2011   Rheumatoid arthritis(714.0) 11/07/2011   SAB (spontaneous abortion)    SAB (spontaneous abortion) 11/07/2011   Shortness of breath    Sjogren's disease ([place])    Wheezing     Past Surgical History:  Procedure Laterality Date   ABDOMINAL SURGERY     bowel repair   COLONOSCOPY     DILATION AND CURETTAGE OF UTERUS     X 2   MENISCUS REPAIR  Right    MENISCUS TEAR REPAIR   MYOMECTOMY N/A 07/27/2014   Procedure: ABDOMINAL MYOMECTOMY;  Surgeon: Waymon Amato, MD;  Location: [facility];  Service: Gynecology;  Laterality: N/A;   PELVIC LAPAROSCOPY  1994   IN 1999 LAPAROSCOPY WITH INCIDENTAL ENTEROTOMY REQUIRING LAPAROTOMY   ROTATOR CUFF REPAIR Left 06/21/2021   UTERINE FIBROID SURGERY     July 2016    Family History  Problem Relation Age of Onset   Hypertension Mother        died from heart disease   Heart disease Mother        deceased, unknown cause   Stroke Mother    Obesity Mother    Diabetes Sister    Hypertension Sister    Esophageal cancer Sister    Stomach cancer Sister    Cancer Sister    Diabetes Sister    Rectal cancer Neg Hx    Colon cancer Neg Hx     Social History   Socioeconomic History   Marital status: Single    Spouse name: Not on file   Number of children: 0   Years of education: college   Highest education level: Not on file  Occupational History   Occupation: LPN    Employer: [employer]  Tobacco Use   Smoking status: Never  Passive exposure: Never   Smokeless tobacco: Never  Vaping Use   Vaping Use: Never used  Substance and Sexual Activity   Alcohol use: No    Alcohol/week: 0.0 standard drinks of alcohol   Drug use: No   Sexual activity: Yes    Birth control/protection: None    Comment: 1st intercourse- 28, partners- 5  Other Topics Concern   Not on file  Social History Narrative   Regular exercise:  No   Caffeine Use: 2 cups coffee daily   No children   "Happily divorced"   Reports that she is involved at her church   Works as an Therapist, 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  Had a sister up in [place] who passed away 24-Feb-2015 who she was close to.   Born [place]- she came to [place] as adult.  Age 62.    Left-handed.         Social Determinants of Health   Financial Resource Strain: Not on file  Food Insecurity: Not on file  Transportation Needs: Not on file  Physical Activity: Not on  file  Stress: Not on file  Social Connections: Not on file  Intimate Partner Violence: Not on file    Outpatient Medications Prior to Visit  Medication Sig Dispense Refill   Abatacept (ORENCIA CLICKJECT) 818 MG/ML SOAJ Inject 174m into the skin every 10 days. 8 mL 0   albuterol (VENTOLIN HFA) 108 (90 Base) MCG/ACT inhaler Inhale 2 puffs into the lungs every 6 (six) hours as needed. 18 g 0   aspirin EC 81 MG tablet Take 1 tablet (81 mg total) by mouth daily. 90 tablet 3   benzonatate (TESSALON) 100 MG capsule Take 1 capsule (100 mg total) by mouth 3 (three) times daily as needed for cough. 21 capsule 0   calcium-vitamin D (OSCAL WITH D) 500-200 MG-UNIT TABS tablet Take by mouth.     carvedilol (COREG) 25 MG tablet Take 1 tablet (25 mg total) by mouth 2 (two) times daily with a meal. 180 tablet 1   Cyanocobalamin (VITAMIN B-12) 5000 MCG LOZG Take 1 tablet by mouth 2 (two) times daily.     cyclobenzaprine (FLEXERIL) 5 MG tablet Take 0.5 tablets (2.5 mg total) by mouth as needed for muscle spasms. 30 tablet 5   dapagliflozin propanediol (FARXIGA) 10 MG TABS tablet Take 1 tablet (10 mg total) by mouth daily before breakfast. 90 tablet 3   fluticasone (FLONASE) 50 MCG/ACT nasal spray Place 2 sprays into both nostrils daily. 16 g 1   Galcanezumab-gnlm (EMGALITY) 120 MG/ML SOAJ Inject 120 mg into the skin every 30 (thirty) days. 1.12 mL 11   hydrochlorothiazide (HYDRODIURIL) 25 MG tablet Take 1 tablet by mouth once daily 90 tablet 0   HYDROcodone bit-homatropine (HYCODAN) 5-1.5 MG/5ML syrup Take 5 mLs by mouth every 8 (eight) hours as needed for cough. 120 mL 0   Iron, Ferrous Sulfate, 325 (65 Fe) MG TABS Take 325 mg by mouth 2 (two) times daily. 30 tablet    nystatin cream (MYCOSTATIN) Apply 1 Application topically 2 (two) times daily. 30 g 0   rosuvastatin (CRESTOR) 10 MG tablet Take 1 tablet (10 mg total) by mouth daily. 90 tablet 1   SUMAtriptan (IMITREX) 100 MG tablet Take 1 tablet (100 mg  total) by mouth as needed for migraine. May repeat in 2 hours if headache persists or recurs. 10 tablet 11   TRAVATAN Z 0.004 % SOLN ophthalmic solution Place 1 drop into both eyes at  bedtime.      valsartan (DIOVAN) 320 MG tablet Take 1 tablet (320 mg total) by mouth daily. 90 tablet 1   No facility-administered medications prior to visit.    Allergies  Allergen Reactions   Gabapentin     Made her "crazy"    Review of Systems  Constitutional:  Positive for chills and malaise/fatigue. Negative for fever.  HENT:  Positive for congestion and ear pain. Negative for sore throat.   Eyes:  Negative for blurred vision.  Respiratory:  Positive for cough, sputum production and shortness of breath. Negative for wheezing.   Cardiovascular:  Negative for chest pain, palpitations and leg swelling.  Gastrointestinal:  Negative for abdominal pain, blood in stool and nausea.  Genitourinary:  Negative for dysuria and frequency.  Musculoskeletal:  Positive for myalgias. Negative for falls.  Skin:  Negative for rash.  Neurological:  Negative for dizziness, loss of consciousness and headaches.  Endo/Heme/Allergies:  Negative for environmental allergies.  Psychiatric/Behavioral:  Negative for depression. The patient is not nervous/anxious.        Objective:    Physical Exam Constitutional:      General: She is not in acute distress.    Appearance: Normal appearance. She is well-developed. She is obese. She is ill-appearing. She is not toxic-appearing.  HENT:     Head: Normocephalic and atraumatic.     Right Ear: Tympanic membrane and external ear normal.     Left Ear: External ear normal.     Ears:     Comments: Left TM erythematous, retrated    Nose: Nose normal.  Eyes:     General:        Right eye: No discharge.        Left eye: No discharge.     Conjunctiva/sclera: Conjunctivae normal.  Neck:     Thyroid: No thyromegaly.  Cardiovascular:     Rate and Rhythm: Normal rate and regular  rhythm.     Heart sounds: Normal heart sounds. No murmur heard. Pulmonary:     Effort: Pulmonary effort is normal. No respiratory distress.  Abdominal:     General: Bowel sounds are normal.     Palpations: Abdomen is soft.     Tenderness: There is no abdominal tenderness. There is no guarding.  Musculoskeletal:        General: Normal range of motion.     Cervical back: Neck supple.  Lymphadenopathy:     Cervical: Cervical adenopathy present.  Skin:    General: Skin is warm and dry.  Neurological:     Mental Status: She is alert and oriented to person, place, and time.  Psychiatric:        Mood and Affect: Mood normal.        Behavior: Behavior normal.        Thought Content: Thought content normal.        Judgment: Judgment normal.     BP 134/80 (BP Location: Right Arm, Patient Position: Sitting, Cuff Size: Normal)   Pulse 80   Temp 98 F (36.7 C) (Oral)   Resp 16   Ht _0  (1.549 m)   Wt 212 lb (96.2 kg)   LMP 08/09/2016   SpO2 95%   BMI 40.06 kg/m  Wt Readings from Last 3 Encounters:  12/18/21 212 lb (96.2 kg)  11/28/21 216 lb (98 kg)  11/14/21 214 lb (97.1 kg)    Diabetic Foot Exam - Simple   No data filed    Lab Results  Component Value Date   WBC 5.5 08/07/2021   HGB 12.8 08/07/2021   HCT 39.5 08/07/2021   PLT 250.0 08/07/2021   GLUCOSE 95 11/14/2021   CHOL 168 11/07/2021   TRIG 114.0 11/07/2021   HDL 56.60 11/07/2021   LDLCALC 89 11/07/2021   ALT 15 08/07/2021   AST 12 08/07/2021   NA 141 11/14/2021   K 4.0 11/14/2021   CL 106 11/14/2021   CREATININE 0.69 11/14/2021   BUN 9 11/14/2021   CO2 31 11/14/2021   TSH 1.05 08/29/2020   INR 1.01 09/09/2013   HGBA1C 6.5 11/07/2021   MICROALBUR 3.20 (H) 11/07/2011    Lab Results  Component Value Date   TSH 1.05 08/29/2020   Lab Results  Component Value Date   WBC 5.5 08/07/2021   HGB 12.8 08/07/2021   HCT 39.5 08/07/2021   MCV 85.5 08/07/2021   PLT 250.0 08/07/2021   Lab Results   Component Value Date   NA 141 11/14/2021   K 4.0 11/14/2021   CHLORIDE 108 01/21/2015   CO2 31 11/14/2021   GLUCOSE 95 11/14/2021   BUN 9 11/14/2021   CREATININE 0.69 11/14/2021   BILITOT 0.5 08/07/2021   ALKPHOS 56 08/07/2021   AST 12 08/07/2021   ALT 15 08/07/2021   PROT 8.8 (H) 08/07/2021   ALBUMIN 4.1 08/07/2021   CALCIUM 9.5 11/14/2021   ANIONGAP 9 03/22/2020   EGFR 87 05/01/2021   GFR 94.36 11/14/2021   Lab Results  Component Value Date   CHOL 168 11/07/2021   Lab Results  Component Value Date   HDL 56.60 11/07/2021   Lab Results  Component Value Date   LDLCALC 89 11/07/2021   Lab Results  Component Value Date   TRIG 114.0 11/07/2021   Lab Results  Component Value Date   CHOLHDL 3 11/07/2021   Lab Results  Component Value Date   HGBA1C 6.5 11/07/2021       Assessment & Plan:  Cough, unspecified type Assessment & Plan: She has been ill for roughly 2 weeks. Notes some head and chest congestion cough is keeping her up. She struggles with malaise, fatigue and headache. 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 Her COVID and flu tests are negative in the office. Started on a Medrol dosepak, Doxycycline 100 mg po bid and Promethazine DM 1/2 to 1 tsp up to 3 x a day prn  Orders: -     POC COVID-19 BinaxNow -     POCT Influenza A/B  Controlled type 2 diabetes mellitus without complication, without long-term current use of insulin (HCC) Assessment & Plan: hgba1c acceptable, minimize simple carbs. Increase exercise as tolerated. Continue current meds    Essential hypertension Assessment & Plan: Well controlled, no changes to meds. Encouraged heart healthy diet such as the DASH diet and exercise as tolerated.     Obesity (BMI 30-39.9) Assessment & Plan: Encouraged DASH or MIND diet, decrease po intake and increase exercise as tolerated. Needs 7-8 hours of sleep nightly. Avoid trans fats, eat small, frequent meals every 4-5 hours with lean  proteins, complex carbs and healthy fats. Minimize simple carbs, high fat foods and processed foods    Other orders -     Doxycycline Hyclate; Take 1 tablet (100 mg total) by mouth 2 (two) times daily.  Dispense: 20 tablet; Refill: 0 -     methylPREDNISolone; 6 tabs po x 1 day then 5 tabs po x 1 day then 4  tabs po x 1 day then 3 tabs po x 1 day then 2 tabs po x 1 day then 1 tab po x 1 day and stop  Dispense: 21 tablet; Refill: 0 -     Promethazine-DM; Take 2.5-5 mLs by mouth 3 (three) times daily as needed for cough.  Dispense: 240 mL; Refill: 0    Penni Homans, MD

## 2021-12-18 NOTE — Assessment & Plan Note (Signed)
hgba1c acceptable, minimize simple carbs. Increase exercise as tolerated. Continue current meds 

## 2021-12-18 NOTE — Assessment & Plan Note (Signed)
She has been ill for roughly 2 weeks. Notes some head and chest congestion cough is keeping her up. She struggles with malaise, fatigue and headache. 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 Her COVID and flu tests are negative in the office. Started on a Medrol dosepak, Doxycycline 100 mg po bid and Promethazine DM 1/2 to 1 tsp up to 3 x a day prn

## 2021-12-18 NOTE — Assessment & Plan Note (Signed)
Encouraged DASH or MIND diet, decrease po intake and increase exercise as tolerated. Needs 7-8 hours of sleep nightly. Avoid trans fats, eat small, frequent meals every 4-5 hours with lean proteins, complex carbs and healthy fats. Minimize simple carbs, high fat foods and processed foods 

## 2021-12-18 NOTE — Assessment & Plan Note (Signed)
Well controlled, no changes to meds. Encouraged heart healthy diet such as the DASH diet and exercise as tolerated.  °

## 2021-12-22 ENCOUNTER — Ambulatory Visit: Payer: 59 | Admitting: Family

## 2021-12-22 VITALS — BP 128/69 | HR 77 | Temp 98.4°F | Resp 16 | Wt 211.0 lb

## 2021-12-22 DIAGNOSIS — I10 Essential (primary) hypertension: Secondary | ICD-10-CM

## 2021-12-22 NOTE — Progress Notes (Signed)
Subjective:   By signing my name below, I, Carylon Perches, attest that this documentation has been prepared under the direction and in the presence of [place], NP 12/22/2021    Patient ID: Amber [name], female    DOB: 1960/02/26, 61 y.o.   MRN: [id number]  Chief Complaint  Patient presents with   Hypertension    Here for follow up    HPI Patient is in today for an office visit.  Cough: She was seen by Dr.Blyth on 12/18/2021 for a cough with associated symptoms. She was prescribed 100 mg of Doxycycline, 4 mg of Prednisone, and 6.25-15 mg/mL of Promethazine-DM. She states as of today's visit, her symptoms are improving.   Blood Pressure: Since last visit, her Carvedilol was increased to 25 mg. As of today's visit, her blood pressure is improving.  BP Readings from Last 3 Encounters:  12/22/21 128/69  12/18/21 134/80  11/28/21 (!) 149/79   Pulse Readings from Last 3 Encounters:  12/22/21 77  12/18/21 80  11/28/21 67   Neck CT Scan: She completed a CT scan of her neck on 12/05/2021. Her imaging showed no significant concerns.   Health Maintenance Due  Topic Date Due   COVID-19 Vaccine (4 - 2023-24 season) 09/01/2021   OPHTHALMOLOGY EXAM  09/13/2021   COLONOSCOPY (Pts 45-69yr Insurance coverage will need to be confirmed)  12/06/2021    Past Medical History:  Diagnosis Date   Anemia    Arthritis    Asthma 05/03/2020   Blood transfusion without reported diagnosis    CAD (coronary artery disease) 04/24/2017   Cerebrovascular disease 10/13/2020   Chronic diastolic CHF (congestive heart failure) ([place] 04/24/2017   Colon polyps    Controlled type 2 diabetes mellitus without complication, without long-term current use of insulin ([place] 05/31/2017   Diabetic peripheral neuropathy ([place] 01/05/2020   Ectopic pregnancy    RIGHT salpingostomy   Elective abortion    Elective abortion 09/26/2020   RIGHT salpingostomy   Essential hypertension 09/04/2017   GERD  (gastroesophageal reflux disease)    Glaucoma    both eyes   Headache    migraines   History of chicken pox    History of chicken pox 09/26/2020   migraines   Hyperlipemia    Hypertension    Iron deficiency anemia 09/02/2013   Migraine 04/19/2014   Obesity (BMI 30-39.9) 10/17/2012   Palpitations 10/01/2012   Rheumatoid arthritis ([place] 11/07/2011   Rheumatoid arthritis(714.0) 11/07/2011   SAB (spontaneous abortion)    SAB (spontaneous abortion) 11/07/2011   Shortness of breath    S[place]ogren's disease ([facility]    Wheezing     Past Surgical History:  Procedure Laterality Date   ABDOMINAL SURGERY     bowel repair   COLONOSCOPY     DILATION AND CURETTAGE OF UTERUS     X 2   MENISCUS REPAIR Right    MENISCUS TEAR REPAIR   MYOMECTOMY N/A 07/27/2014   Procedure: ABDOMINAL MYOMECTOMY;  Surgeon: EWaymon Amato MD;  [facility];  Service: Gynecology;  Laterality: N/A;   PELVIC LAPAROSCOPY  1994   IN 1999 LAPAROSCOPY WITH INCIDENTAL ENTEROTOMY REQUIRING LAPAROTOMY   ROTATOR CUFF REPAIR Left 06/21/2021   UTERINE FIBROID SURGERY     July 2016    Family History  Problem Relation Age of Onset   Hypertension Mother        died from heart disease   Heart disease Mother        deceased,  unknown cause   Stroke Mother    Obesity Mother    Diabetes Sister    Hypertension Sister    Esophageal cancer Sister    Stomach cancer Sister    Cancer Sister    Diabetes Sister    Rectal cancer Neg Hx    Colon cancer Neg Hx     Social History   Socioeconomic History   Marital status: Single    Spouse name: Not on file   Number of children: 0   Years of education: college   Highest education level: Not on file  Occupational History   Occupation: LPN    Employer: [employer]  Tobacco Use   Smoking status: Never    Passive exposure: Never   Smokeless tobacco: Never  Vaping Use   Vaping Use: Never used  Substance and Sexual Activity   Alcohol use: No    Alcohol/week: 0.0 standard drinks of  alcohol   Drug use: No   Sexual activity: Yes    Birth control/protection: None    Comment: 1st intercourse- 62, partners- 5  Other Topics Concern   Not on file  Social History Narrative   Regular exercise:  No   Caffeine Use: 2 cups coffee daily   No children   "Happily divorced"   Reports that she is involved at her church   Works as an Therapist, 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  Had a sister up in [place] who passed away 07-Apr-2015 who she was close to.   Born [place]- she came to [place] as adult.  Age 51.    Left-handed.         Social Determinants of Health   Financial Resource Strain: Not on file  Food Insecurity: Not on file  Transportation Needs: Not on file  Physical Activity: Not on file  Stress: Not on file  Social Connections: Not on file  Intimate Partner Violence: Not on file    Outpatient Medications Prior to Visit  Medication Sig Dispense Refill   Abatacept (ORENCIA CLICKJECT) 299 MG/ML SOAJ Inject '125mg'$  into the skin every 10 days. 8 mL 0   albuterol (VENTOLIN HFA) 108 (90 Base) MCG/ACT inhaler Inhale 2 puffs into the lungs every 6 (six) hours as needed. 18 g 0   aspirin EC 81 MG tablet Take 1 tablet (81 mg total) by mouth daily. 90 tablet 3   benzonatate (TESSALON) 100 MG capsule Take 1 capsule (100 mg total) by mouth 3 (three) times daily as needed for cough. 21 capsule 0   calcium-vitamin D (OSCAL WITH D) 500-200 MG-UNIT TABS tablet Take by mouth.     carvedilol (COREG) 25 MG tablet Take 1 tablet (25 mg total) by mouth 2 (two) times daily with a meal. 180 tablet 1   Cyanocobalamin (VITAMIN B-12) 5000 MCG LOZG Take 1 tablet by mouth 2 (two) times daily.     cyclobenzaprine (FLEXERIL) 5 MG tablet Take 0.5 tablets (2.5 mg total) by mouth as needed for muscle spasms. 30 tablet 5   dapagliflozin propanediol (FARXIGA) 10 MG TABS tablet Take 1 tablet (10 mg total) by mouth daily before breakfast. 90 tablet 3   doxycycline (VIBRA-TABS) 100 MG tablet Take 1 tablet (100 mg total) by  mouth 2 (two) times daily. 20 tablet 0   fluticasone (FLONASE) 50 MCG/ACT nasal spray Place 2 sprays into both nostrils daily. 16 g 1   Galcanezumab-gnlm (EMGALITY) 120 MG/ML SOAJ Inject 120 mg into the skin every 30 (thirty) days. 1.12 mL 11  hydrochlorothiazide (HYDRODIURIL) 25 MG tablet Take 1 tablet by mouth once daily 90 tablet 0   HYDROcodone bit-homatropine (HYCODAN) 5-1.5 MG/5ML syrup Take 5 mLs by mouth every 8 (eight) hours as needed for cough. 120 mL 0   Iron, Ferrous Sulfate, 325 (65 Fe) MG TABS Take 325 mg by mouth 2 (two) times daily. 30 tablet    methylPREDNISolone (MEDROL) 4 MG tablet 6 tabs po x 1 day then 5 tabs po x 1 day then 4 tabs po x 1 day then 3 tabs po x 1 day then 2 tabs po x 1 day then 1 tab po x 1 day and stop 21 tablet 0   nystatin cream (MYCOSTATIN) Apply 1 Application topically 2 (two) times daily. 30 g 0   promethazine-dextromethorphan (PROMETHAZINE-DM) 6.25-15 MG/5ML syrup Take 2.5-5 mLs by mouth 3 (three) times daily as needed for cough. 240 mL 0   rosuvastatin (CRESTOR) 10 MG tablet Take 1 tablet (10 mg total) by mouth daily. 90 tablet 1   SUMAtriptan (IMITREX) 100 MG tablet Take 1 tablet (100 mg total) by mouth as needed for migraine. May repeat in 2 hours if headache persists or recurs. 10 tablet 11   TRAVATAN Z 0.004 % SOLN ophthalmic solution Place 1 drop into both eyes at bedtime.      valsartan (DIOVAN) 320 MG tablet Take 1 tablet (320 mg total) by mouth daily. 90 tablet 1   No facility-administered medications prior to visit.    Allergies  Allergen Reactions   Gabapentin     Made her "crazy"    ROS See HPI    Objective:    Physical Exam Constitutional:      General: She is not in acute distress.    Appearance: Normal appearance. She is not ill-appearing.  HENT:     Head: Normocephalic and atraumatic.     Right Ear: External ear normal.     Left Ear: External ear normal.  Eyes:     Extraocular Movements: Extraocular movements intact.      Pupils: Pupils are equal, round, and reactive to light.  Cardiovascular:     Rate and Rhythm: Normal rate and regular rhythm.     Heart sounds: Normal heart sounds. No murmur heard.    No gallop.  Pulmonary:     Effort: Pulmonary effort is normal. No respiratory distress.     Breath sounds: Normal breath sounds. No wheezing or rales.  Skin:    General: Skin is warm and dry.  Neurological:     Mental Status: She is alert and oriented to person, place, and time.  Psychiatric:        Mood and Affect: Mood normal.        Behavior: Behavior normal.        Judgment: Judgment normal.     BP 128/69 (BP Location: Right Arm, Patient Position: Sitting, Cuff Size: Large)   Pulse 77   Temp 98.4 F (36.9 C) (Oral)   Resp 16   Wt 211 lb (95.7 kg)   LMP 08/09/2016   SpO2 99%   BMI 39.87 kg/m  Wt Readings from Last 3 Encounters:  12/22/21 211 lb (95.7 kg)  12/18/21 212 lb (96.2 kg)  11/28/21 216 lb (98 kg)       Assessment & Plan:   Problem List Items Addressed This Visit       Unprioritized   Essential hypertension - Primary    BP Readings from Last 3 Encounters:  12/22/21 128/69  12/18/21 134/80  11/28/21 (!) 149/79  BP is improved on increased dose of carvedilol.  Continue same along with diovan/hctz. Continue same.       No orders of the defined types were placed in this encounter.   I, [name], NP, personally preformed the services described in this documentation.  All medical record entries made by the scribe were at my direction and in my presence.  I have reviewed the chart and discharge instructions (if applicable) and agree that the record reflects my personal performance and is accurate and complete. 12/22/2021   I,Amber Collins,acting as a scribe for [name], NP.,have documented all relevant documentation on the behalf of [name], NP,as directed by  [name], NP while in the presence of [name],  NP.   [name], NP

## 2021-12-22 NOTE — Assessment & Plan Note (Signed)
BP Readings from Last 3 Encounters:  12/22/21 128/69  12/18/21 134/80  11/28/21 (!) 149/79   BP is improved on increased dose of carvedilol.  Continue same along with diovan/hctz. Continue same.

## 2022-01-07 ENCOUNTER — Telehealth: Payer: Self-pay | Admitting: Pharmacist

## 2022-01-07 NOTE — Telephone Encounter (Signed)
Submitted a Prior Authorization RENEWAL request to [facility] for [facility] via CoverMyMeds. Will update once we receive a response.  Key: [id number]  Knox Saliva, PharmD, MPH, BCPS, CPP Clinical Pharmacist (Rheumatology and Pulmonology)

## 2022-01-08 NOTE — Telephone Encounter (Signed)
Received notification from [facility] regarding a prior authorization for [facility]. Authorization has been APPROVED from 01/08/2022 to 01/08/2023. Approval letter sent to scan center.  [place] # [id number] Phone # [phone]  Knox Saliva, PharmD, MPH, BCPS, CPP Clinical Pharmacist (Rheumatology and Pulmonology)

## 2022-02-12 ENCOUNTER — Encounter: Payer: Self-pay | Admitting: *Deleted

## 2022-02-14 ENCOUNTER — Other Ambulatory Visit: Payer: Self-pay | Admitting: *Deleted

## 2022-02-14 DIAGNOSIS — Z79899 Other long term (current) drug therapy: Secondary | ICD-10-CM

## 2022-02-14 DIAGNOSIS — M0579 Rheumatoid arthritis with rheumatoid factor of multiple sites without organ or systems involvement: Secondary | ICD-10-CM

## 2022-02-14 DIAGNOSIS — M3509 Sicca syndrome with other organ involvement: Secondary | ICD-10-CM

## 2022-02-14 MED ORDER — ORENCIA CLICKJECT 125 MG/ML ~~LOC~~ SOAJ: SUBCUTANEOUS | 0 refills | Status: DC

## 2022-02-14 NOTE — Telephone Encounter (Signed)
Next Visit: 03/08/2022  Last Visit: 10/42023  Last Fill: 12/04/2021  DX: Rheumatoid arthritis involving multiple sites with positive rheumatoid factor   Current Dose per office note 10/04/2021: Orencia 125 mg subcutaneous every 10 days   Labs: 11/14/2021 BMP normal, 08/07/2021 CBC RBC 5.23, LMOM labs are due  TB Gold: : 01/26/2021   TB gold negative     Okay to refill Orencia?

## 2022-02-16 ENCOUNTER — Other Ambulatory Visit: Payer: Self-pay | Admitting: *Deleted

## 2022-02-16 DIAGNOSIS — M3509 Sicca syndrome with other organ involvement: Secondary | ICD-10-CM

## 2022-02-16 DIAGNOSIS — M0579 Rheumatoid arthritis with rheumatoid factor of multiple sites without organ or systems involvement: Secondary | ICD-10-CM

## 2022-02-16 DIAGNOSIS — Z79899 Other long term (current) drug therapy: Secondary | ICD-10-CM

## 2022-02-18 LAB — COMPLETE METABOLIC PANEL WITH GFR
AG Ratio: 0.9 (calc) — ABNORMAL LOW (ref 1.0–2.5)
ALT: 17 U/L (ref 6–29)
AST: 20 U/L (ref 10–35)
Albumin: 4 g/dL (ref 3.6–5.1)
Alkaline phosphatase (APISO): 50 U/L (ref 37–153)
BUN: 8 mg/dL (ref 7–25)
CO2: 25 mmol/L (ref 20–32)
Calcium: 9.6 mg/dL (ref 8.6–10.4)
Chloride: 106 mmol/L (ref 98–110)
Creat: 0.6 mg/dL (ref 0.50–1.05)
Globulin: 4.6 g/dL (calc) — ABNORMAL HIGH (ref 1.9–3.7)
Glucose, Bld: 87 mg/dL (ref 65–99)
Potassium: 4.2 mmol/L (ref 3.5–5.3)
Sodium: 139 mmol/L (ref 135–146)
Total Bilirubin: 1.1 mg/dL (ref 0.2–1.2)
Total Protein: 8.6 g/dL — ABNORMAL HIGH (ref 6.1–8.1)
eGFR: 103 mL/min/{1.73_m2} (ref >=60)

## 2022-02-18 LAB — CBC WITH DIFFERENTIAL/PLATELET
Absolute Monocytes: 211 cells/uL (ref 200–950)
Basophils Absolute: 9 cells/uL (ref 0–200)
Basophils Relative: 0.3 %
Eosinophils Absolute: 99 cells/uL (ref 15–500)
Eosinophils Relative: 3.2 %
HCT: 41.5 % (ref 35.0–45.0)
Hemoglobin: 13.4 g/dL (ref 11.7–15.5)
Lymphs Abs: 2226 cells/uL (ref 850–3900)
MCH: 27.3 pg (ref 27.0–33.0)
MCHC: 32.3 g/dL (ref 32.0–36.0)
MCV: 84.5 fL (ref 80.0–100.0)
MPV: 11.1 fL (ref 7.5–12.5)
Monocytes Relative: 6.8 %
Neutro Abs: 555 cells/uL — ABNORMAL LOW (ref 1500–7800)
Neutrophils Relative %: 17.9 %
Platelets: 187 10*3/uL (ref 140–400)
RBC: 4.91 10*6/uL (ref 3.80–5.10)
RDW: 12.8 % (ref 11.0–15.0)
Total Lymphocyte: 71.8 %
WBC: 3.1 10*3/uL — ABNORMAL LOW (ref 3.8–10.8)

## 2022-02-18 LAB — QUANTIFERON-TB GOLD PLUS
Mitogen-NIL: >10 IU/mL
NIL: 0.03 IU/mL
QuantiFERON-TB Gold Plus: NEGATIVE
TB1-NIL: 0 IU/mL
TB2-NIL: 0 IU/mL

## 2022-02-18 NOTE — Progress Notes (Signed)
WBC count and absolute neutrophils are low due to immunosuppression.  Patient was doing well at the last visit no joint inflammation.  She has been taking Orencia injections every 10 days.  Please advise her to space Orencia every 14 days.  Repeat CBC with differential in 1 month.  CMP is stable.

## 2022-02-19 ENCOUNTER — Other Ambulatory Visit: Payer: Self-pay | Admitting: *Deleted

## 2022-02-19 ENCOUNTER — Ambulatory Visit: Payer: 59 | Admitting: Family

## 2022-02-19 DIAGNOSIS — M3509 Sicca syndrome with other organ involvement: Secondary | ICD-10-CM

## 2022-02-19 DIAGNOSIS — Z79899 Other long term (current) drug therapy: Secondary | ICD-10-CM

## 2022-02-19 DIAGNOSIS — M0579 Rheumatoid arthritis with rheumatoid factor of multiple sites without organ or systems involvement: Secondary | ICD-10-CM

## 2022-02-19 MED ORDER — ORENCIA CLICKJECT 125 MG/ML ~~LOC~~ SOAJ: SUBCUTANEOUS | 0 refills | Status: DC

## 2022-02-19 NOTE — Progress Notes (Signed)
TB Gold is negative.

## 2022-02-22 NOTE — Progress Notes (Deleted)
Office Visit Note  Patient: Amber [name]             Date of Birth: August 23, 1960           MRN: [id number]             PCP: [name], NP Referring: [name], NP Visit Date: 03/08/2022 Occupation: @GUAROCC$ @  Subjective:  No chief complaint on file.   History of Present Illness: Amber [name] is a 62 y.o. female ***     Activities of Daily Living:  Patient reports morning stiffness for *** {minute/hour:19697}.   Patient {ACTIONS;DENIES/REPORTS:21021675::"Denies"} nocturnal pain.  Difficulty dressing/grooming: {ACTIONS;DENIES/REPORTS:21021675::"Denies"} Difficulty climbing stairs: {ACTIONS;DENIES/REPORTS:21021675::"Denies"} Difficulty getting out of chair: {ACTIONS;DENIES/REPORTS:21021675::"Denies"} Difficulty using hands for taps, buttons, cutlery, and/or writing: {ACTIONS;DENIES/REPORTS:21021675::"Denies"}  No Rheumatology ROS completed.   PMFS History:  Patient Active Problem List   Diagnosis Date Noted   Cough 12/18/2021   Neck swelling 11/28/2021   High risk HPV infection 10/17/2020   Cerebrovascular disease 10/13/2020   Colon polyps 09/26/2020   GERD (gastroesophageal reflux disease) 09/26/2020   B12 deficiency 05/03/2020   Hyperlipidemia 05/03/2020   Asthma 05/03/2020   Diabetic peripheral neuropathy ([place]) 01/05/2020   Class 2 severe obesity with serious comorbidity and body mass index (BMI) of 38.0 to 38.9 in adult, unspecified obesity type ([place]) 01/05/2020   Adrenal mass, right ([place]) 01/05/2020   Hypertensive heart disease 09/04/2017   Controlled type 2 diabetes mellitus without complication, without long-term current use of insulin (HCC) 05/31/2017   Chronic diastolic CHF (congestive heart failure) ([place]) 04/24/2017   CAD (coronary artery disease) 04/24/2017   History of BCG vaccination 06/14/2016   Fibroids 07/27/2014   Migraine 04/19/2014   Elevated serum protein level 04/09/2014   Preventative health care 04/09/2014    Iron deficiency anemia 09/02/2013   Obesity (BMI 30-39.9) 10/17/2012   Glaucoma 11/07/2011   Rheumatoid arthritis ([place]) 11/07/2011   Essential hypertension 11/05/2011   Sjogren's disease ([place]) 11/05/2011   Anemia 09/22/2010   Fibroid uterus 09/22/2010    Past Medical History:  Diagnosis Date   Anemia    Arthritis    Asthma 05/03/2020   Blood transfusion without reported diagnosis    CAD (coronary artery disease) 04/24/2017   Cerebrovascular disease 10/13/2020   Chronic diastolic CHF (congestive heart failure) ([place]) 04/24/2017   Colon polyps    Controlled type 2 diabetes mellitus without complication, without long-term current use of insulin ([place]) 05/31/2017   Diabetic peripheral neuropathy ([place]) 01/05/2020   Ectopic pregnancy    RIGHT salpingostomy   Elective abortion    Elective abortion 09/26/2020   RIGHT salpingostomy   Essential hypertension 09/04/2017   GERD (gastroesophageal reflux disease)    Glaucoma    both eyes   Headache    migraines   History of chicken pox    History of chicken pox 09/26/2020   migraines   Hyperlipemia    Hypertension    Iron deficiency anemia 09/02/2013   Migraine 04/19/2014   Obesity (BMI 30-39.9) 10/17/2012   Palpitations 10/01/2012   Rheumatoid arthritis ([place]) 11/07/2011   Rheumatoid arthritis(714.0) 11/07/2011   SAB (spontaneous abortion)    SAB (spontaneous abortion) 11/07/2011   Shortness of breath    Sjogren's disease ([place])    Wheezing     Family History  Problem Relation Age of Onset   Hypertension Mother        died from heart disease   Heart disease Mother        deceased, unknown  cause   Stroke Mother    Obesity Mother    Diabetes Sister    Hypertension Sister    Esophageal cancer Sister    Stomach cancer Sister    Cancer Sister    Diabetes Sister    Rectal cancer Neg Hx    Colon cancer Neg Hx    Past Surgical History:  Procedure Laterality Date   ABDOMINAL SURGERY     bowel repair   COLONOSCOPY     DILATION AND CURETTAGE  OF UTERUS     X 2   MENISCUS REPAIR Right    MENISCUS TEAR REPAIR   MYOMECTOMY N/A 07/27/2014   Procedure: ABDOMINAL MYOMECTOMY;  Surgeon: Waymon Amato, MD;  Location: [facility];  Service: Gynecology;  Laterality: N/A;   PELVIC LAPAROSCOPY  1994   IN 1999 LAPAROSCOPY WITH INCIDENTAL ENTEROTOMY REQUIRING LAPAROTOMY   ROTATOR CUFF REPAIR Left 06/21/2021   UTERINE FIBROID SURGERY     July 2016   Social History   Social History Narrative   Regular exercise:  No   Caffeine Use: 2 cups coffee daily   No children   "Happily divorced"   Reports that she is involved at her church   Works as an Therapist, 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  Had a sister up in [place] who passed away 04-Apr-2015 who she was close to.   Born [place]- she came to [place] as adult.  Age 72.    Left-handed.         Immunization History  Administered Date(s) Administered   Hepatitis A, Adult 11/01/2017, 12/03/2018   IPV 11/01/2017   Influenza Split 11/07/2011   Influenza,inj,Quad PF,6+ Mos 10/17/2012, 01/15/2014, 08/31/2014, 09/30/2017, 09/10/2018, 09/21/2019, 10/07/2020, 11/07/2021   Meningococcal Mcv4o 11/01/2017   Moderna Sars-Covid-2 Vaccination 01/28/2019, 02/27/2019, 11/27/2019   Pneumococcal Conjugate-13 08/05/2020   Pneumococcal Polysaccharide-23 09/10/2018   Tdap 06/01/2011, 11/25/2012   Typhoid Live 11/01/2017   Zoster Recombinat (Shingrix) 10/04/2017, 12/04/2017     Objective: Vital Signs: LMP 08/09/2016    Physical Exam   Musculoskeletal Exam: ***  CDAI Exam: CDAI Score: -- Patient Global: --; Provider Global: -- Swollen: --; Tender: -- Joint Exam 03/08/2022   No joint exam has been documented for this visit   There is currently no information documented on the homunculus. Go to the Rheumatology activity and complete the homunculus joint exam.  Investigation: No additional findings.  Imaging: No results found.  Recent Labs: Lab Results  Component Value Date   WBC 3.1 (L) 02/16/2022   HGB 13.4  02/16/2022   PLT 187 02/16/2022   NA 139 02/16/2022   K 4.2 02/16/2022   CL 106 02/16/2022   CO2 25 02/16/2022   GLUCOSE 87 02/16/2022   BUN 8 02/16/2022   CREATININE 0.60 02/16/2022   BILITOT 1.1 02/16/2022   ALKPHOS 56 08/07/2021   AST 20 02/16/2022   ALT 17 02/16/2022   PROT 8.6 (H) 02/16/2022   ALBUMIN 4.1 08/07/2021   CALCIUM 9.6 02/16/2022   GFRAA 115 02/19/2020   QFTBGOLD Negative 06/14/2016   QFTBGOLDPLUS NEGATIVE 02/16/2022    Speciality Comments: PLQ eye exam: 12/09/2018 normal. [facility].  Procedures:  No procedures performed Allergies: Gabapentin   Assessment / Plan:     Visit Diagnoses: Rheumatoid arthritis involving multiple sites with positive rheumatoid factor (HCC)  High risk medication use  Sjogren's syndrome with other organ involvement (HCC)  S/P rotator cuff repair  Primary osteoarthritis of both knees  Primary osteoarthritis of both feet  Plantar  fasciitis of right foot  Neck pain  Family history of rheumatoid arthritis  Controlled type 2 diabetes mellitus without complication, without long-term current use of insulin ([place])  Coronary artery disease involving native coronary artery of native heart without angina pectoris  Essential hypertension  Palpitations  Chronic diastolic CHF (congestive heart failure) (HCC)  Hx of migraines  Other fatigue  Orders: No orders of the defined types were placed in this encounter.  No orders of the defined types were placed in this encounter.   Face-to-face time spent with patient was *** minutes. Greater than 50% of time was spent in counseling and coordination of care.  Follow-Up Instructions: No follow-ups on file.   Ofilia Neas, PA-C  Note - This record has been created using Dragon software.  Chart creation errors have been sought, but may not always  have been located. Such creation errors do not reflect on  the standard of medical care.

## 2022-03-07 ENCOUNTER — Ambulatory Visit: Payer: 59 | Admitting: Family

## 2022-03-07 ENCOUNTER — Encounter: Payer: Self-pay | Admitting: Oncology

## 2022-03-07 ENCOUNTER — Telehealth: Payer: Self-pay | Admitting: Family

## 2022-03-07 VITALS — BP 165/78 | HR 84 | Temp 99.7°F | Resp 16 | Wt 212.0 lb

## 2022-03-07 DIAGNOSIS — E538 Deficiency of other specified B group vitamins: Secondary | ICD-10-CM

## 2022-03-07 DIAGNOSIS — E119 Type 2 diabetes mellitus without complications: Secondary | ICD-10-CM | POA: Diagnosis not present

## 2022-03-07 DIAGNOSIS — J069 Acute upper respiratory infection, unspecified: Secondary | ICD-10-CM | POA: Diagnosis not present

## 2022-03-07 DIAGNOSIS — I10 Essential (primary) hypertension: Secondary | ICD-10-CM

## 2022-03-07 DIAGNOSIS — M0579 Rheumatoid arthritis with rheumatoid factor of multiple sites without organ or systems involvement: Secondary | ICD-10-CM

## 2022-03-07 DIAGNOSIS — Z1211 Encounter for screening for malignant neoplasm of colon: Secondary | ICD-10-CM

## 2022-03-07 LAB — COMPREHENSIVE METABOLIC PANEL
ALT: 19 U/L (ref 0–35)
AST: 16 U/L (ref 0–37)
Albumin: 3.6 g/dL (ref 3.5–5.2)
Alkaline Phosphatase: 49 U/L (ref 39–117)
BUN: 11 mg/dL (ref 6–23)
CO2: 28 mEq/L (ref 19–32)
Calcium: 9.5 mg/dL (ref 8.4–10.5)
Chloride: 103 mEq/L (ref 96–112)
Creatinine, Ser: 0.7 mg/dL (ref 0.40–1.20)
GFR: 93.83 mL/min (ref >60.00)
Glucose, Bld: 120 mg/dL — ABNORMAL HIGH (ref 70–99)
Potassium: 3.8 mEq/L (ref 3.5–5.1)
Sodium: 138 mEq/L (ref 135–145)
Total Bilirubin: 0.7 mg/dL (ref 0.2–1.2)
Total Protein: 7.5 g/dL (ref 6.0–8.3)

## 2022-03-07 LAB — MICROALBUMIN / CREATININE URINE RATIO
Creatinine,U: 91.1 mg/dL
Microalb Creat Ratio: 1.1 mg/g (ref 0.0–30.0)
Microalb, Ur: 1 mg/dL (ref 0.0–1.9)

## 2022-03-07 LAB — HEMOGLOBIN A1C: Hgb A1c MFr Bld: 6.6 % — ABNORMAL HIGH (ref 4.6–6.5)

## 2022-03-07 MED ORDER — ROSUVASTATIN CALCIUM 10 MG PO TABS: 10.0000 mg | ORAL_TABLET | Freq: Every day | ORAL | 1 refills | Status: DC

## 2022-03-07 MED ORDER — DILTIAZEM HCL ER COATED BEADS 120 MG PO CP24: 120.0000 mg | ORAL_CAPSULE | Freq: Every day | ORAL | 5 refills | Status: DC

## 2022-03-07 MED ORDER — CYANOCOBALAMIN 1000 MCG/ML IJ SOLN
1000.0000 ug | Freq: Once | INTRAMUSCULAR | Status: AC
  Administered 2022-03-07: 1000 ug via INTRAMUSCULAR

## 2022-03-07 MED ORDER — CARVEDILOL 25 MG PO TABS: 25.0000 mg | ORAL_TABLET | Freq: Two times a day (BID) | ORAL | 1 refills | Status: DC

## 2022-03-07 MED ORDER — FLUTICASONE PROPIONATE 50 MCG/ACT NA SUSP: 2.0000 | Freq: Every day | NASAL | 1 refills | Status: DC

## 2022-03-07 MED ORDER — VALSARTAN 320 MG PO TABS: 320.0000 mg | ORAL_TABLET | Freq: Every day | ORAL | 1 refills | Status: DC

## 2022-03-07 MED ORDER — SUMATRIPTAN SUCCINATE 100 MG PO TABS: 100.0000 mg | ORAL_TABLET | ORAL | 11 refills | Status: DC | PRN

## 2022-03-07 MED ORDER — HYDROCHLOROTHIAZIDE 25 MG PO TABS: 25.0000 mg | ORAL_TABLET | Freq: Every day | ORAL | 0 refills | Status: DC

## 2022-03-07 NOTE — Assessment & Plan Note (Signed)
Mild, continue supportive measures, call if symptoms do not continue to improve.

## 2022-03-07 NOTE — Assessment & Plan Note (Signed)
BP is uncontrolled. Will add diltiazem for BP control. Cardiology previously preferred over amlodipine due to concern for rebound tachycardia on amlodipine.She will send me some updated BP and HR readings in a few days.

## 2022-03-07 NOTE — Assessment & Plan Note (Signed)
Diet controlled. Continue healthy diet, exercise and weight loss efforts.

## 2022-03-07 NOTE — Telephone Encounter (Signed)
Records release sent

## 2022-03-07 NOTE — Telephone Encounter (Signed)
Please call [facility] and request DM eye exam.

## 2022-03-07 NOTE — Assessment & Plan Note (Signed)
Symptoms are much improved since she began [place]. Continue management per rheumatology.

## 2022-03-07 NOTE — Assessment & Plan Note (Signed)
B12 injection today and continue monthly.

## 2022-03-07 NOTE — Patient Instructions (Signed)
Add diltiazem '120mg'$  once daily. Check blood pressure and heart rate

## 2022-03-07 NOTE — Progress Notes (Signed)
Subjective:     Patient ID: Amber [name], female    DOB: August 26, 1960, 62 y.o.   MRN: [id number]  Chief Complaint  Patient presents with   Diabetes    Here for follow up   Hypertension    Here for follow up   Nasal Congestion    Complains of runny nose since last night    HPI Patient is in today for follow up.  Has had a runny nose since last night, lungs feel tight, rapid covid testing was negative.  HTN- notes that she is not sleeping well due to work special. She works [employer] to [employer]. 3-4 times a week. BP meds include HCTZ, carvedilol and diovan.   BP Readings from Last 3 Encounters:  03/07/22 (!) 165/78  12/22/21 128/69  12/18/21 134/80   DM2-  Lab Results  Component Value Date   HGBA1C 6.6 (H) 03/07/2022   HGBA1C 6.5 11/07/2021   HGBA1C 6.5 08/07/2021   Lab Results  Component Value Date   MICROALBUR 1.0 03/07/2022   LDLCALC 89 11/07/2021   CREATININE 0.70 03/07/2022   She sees rheumatology tomorrow and notes much improvement in her joint pain with orencia.   Health Maintenance Due  Topic Date Due   COVID-19 Vaccine (5 - 2023-24 season) 09/01/2021   OPHTHALMOLOGY EXAM  09/13/2021   COLONOSCOPY (Pts 45-31yr Insurance coverage will need to be confirmed)  12/06/2021    Past Medical History:  Diagnosis Date   Anemia    Arthritis    Asthma 05/03/2020   Blood transfusion without reported diagnosis    CAD (coronary artery disease) 04/24/2017   Cerebrovascular disease 10/13/2020   Chronic diastolic CHF (congestive heart failure) ([place] 04/24/2017   Colon polyps    Controlled type 2 diabetes mellitus without complication, without long-term current use of insulin ([place] 05/31/2017   Diabetic peripheral neuropathy ([place] 01/05/2020   Ectopic pregnancy    RIGHT salpingostomy   Elective abortion    Elective abortion 09/26/2020   RIGHT salpingostomy   Essential hypertension 09/04/2017   GERD (gastroesophageal reflux disease)    Glaucoma    both eyes   Headache     migraines   History of chicken pox    History of chicken pox 09/26/2020   migraines   Hyperlipemia    Hypertension    Iron deficiency anemia 09/02/2013   Migraine 04/19/2014   Obesity (BMI 30-39.9) 10/17/2012   Palpitations 10/01/2012   Rheumatoid arthritis ([place] 11/07/2011   Rheumatoid arthritis(714.0) 11/07/2011   SAB (spontaneous abortion)    SAB (spontaneous abortion) 11/07/2011   Shortness of breath    Sjogren's disease ([place]    Wheezing     Past Surgical History:  Procedure Laterality Date   ABDOMINAL SURGERY     bowel repair   COLONOSCOPY     DILATION AND CURETTAGE OF UTERUS     X 2   MENISCUS REPAIR Right    MENISCUS TEAR REPAIR   MYOMECTOMY N/A 07/27/2014   Procedure: ABDOMINAL MYOMECTOMY;  Surgeon: EWaymon Amato MD;  [facility];  Service: Gynecology;  Laterality: N/A;   PELVIC LAPAROSCOPY  1994   IN 1999 LAPAROSCOPY WITH INCIDENTAL ENTEROTOMY REQUIRING LAPAROTOMY   ROTATOR CUFF REPAIR Left 06/21/2021   UTERINE FIBROID SURGERY     July 2016    Family History  Problem Relation Age of Onset   Hypertension Mother        died from heart disease   Heart disease Mother  deceased, unknown cause   Stroke Mother    Obesity Mother    Diabetes Sister    Hypertension Sister    Esophageal cancer Sister    Stomach cancer Sister    Cancer Sister    Diabetes Sister    Rectal cancer Neg Hx    Colon cancer Neg Hx     Social History   Socioeconomic History   Marital status: Single    Spouse name: Not on file   Number of children: 0   Years of education: college   Highest education level: Not on file  Occupational History   Occupation: LPN    Employer: [employer]  Tobacco Use   Smoking status: Never    Passive exposure: Never   Smokeless tobacco: Never  Vaping Use   Vaping Use: Never used  Substance and Sexual Activity   Alcohol use: No    Alcohol/week: 0.0 standard drinks of alcohol   Drug use: No   Sexual activity: Yes    Birth control/protection:  None    Comment: 1st intercourse- 34, partners- 52  Other Topics Concern   Not on file  Social History Narrative   Regular exercise:  No   Caffeine Use: 2 cups coffee daily   No children   "Happily divorced"   Reports that she is involved at her church   Works as an Therapist, 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  Had a sister up in [place] who passed away 03-22-15 who she was close to.   Born [place]- she came to [place] as adult.  Age 43.    Left-handed.         Social Determinants of Health   Financial Resource Strain: Not on file  Food Insecurity: Not on file  Transportation Needs: Not on file  Physical Activity: Not on file  Stress: Not on file  Social Connections: Not on file  Intimate Partner Violence: Not on file    Outpatient Medications Prior to Visit  Medication Sig Dispense Refill   Abatacept (ORENCIA CLICKJECT) 0000000 MG/ML SOAJ Inject '125mg'$  into the skin every 14 days. 6 mL 0   albuterol (VENTOLIN HFA) 108 (90 Base) MCG/ACT inhaler Inhale 2 puffs into the lungs every 6 (six) hours as needed. 18 g 0   aspirin EC 81 MG tablet Take 1 tablet (81 mg total) by mouth daily. 90 tablet 3   calcium-vitamin D (OSCAL WITH D) 500-200 MG-UNIT TABS tablet Take by mouth.     Cyanocobalamin (VITAMIN B-12) 5000 MCG LOZG Take 1 tablet by mouth 2 (two) times daily.     cyclobenzaprine (FLEXERIL) 5 MG tablet Take 0.5 tablets (2.5 mg total) by mouth as needed for muscle spasms. 30 tablet 5   Galcanezumab-gnlm (EMGALITY) 120 MG/ML SOAJ Inject 120 mg into the skin every 30 (thirty) days. 1.12 mL 11   Iron, Ferrous Sulfate, 325 (65 Fe) MG TABS Take 325 mg by mouth 2 (two) times daily. 30 tablet    nystatin cream (MYCOSTATIN) Apply 1 Application topically 2 (two) times daily. 30 g 0   promethazine-dextromethorphan (PROMETHAZINE-DM) 6.25-15 MG/5ML syrup Take 2.5-5 mLs by mouth 3 (three) times daily as needed for cough. 240 mL 0   TRAVATAN Z 0.004 % SOLN ophthalmic solution Place 1 drop into both eyes at bedtime.       benzonatate (TESSALON) 100 MG capsule Take 1 capsule (100 mg total) by mouth 3 (three) times daily as needed for cough. 21 capsule 0   carvedilol (COREG) 25 MG  tablet Take 1 tablet (25 mg total) by mouth 2 (two) times daily with a meal. 180 tablet 1   dapagliflozin propanediol (FARXIGA) 10 MG TABS tablet Take 1 tablet (10 mg total) by mouth daily before breakfast. 90 tablet 3   doxycycline (VIBRA-TABS) 100 MG tablet Take 1 tablet (100 mg total) by mouth 2 (two) times daily. 20 tablet 0   fluticasone (FLONASE) 50 MCG/ACT nasal spray Place 2 sprays into both nostrils daily. 16 g 1   hydrochlorothiazide (HYDRODIURIL) 25 MG tablet Take 1 tablet by mouth once daily 90 tablet 0   HYDROcodone bit-homatropine (HYCODAN) 5-1.5 MG/5ML syrup Take 5 mLs by mouth every 8 (eight) hours as needed for cough. 120 mL 0   methylPREDNISolone (MEDROL) 4 MG tablet 6 tabs po x 1 day then 5 tabs po x 1 day then 4 tabs po x 1 day then 3 tabs po x 1 day then 2 tabs po x 1 day then 1 tab po x 1 day and stop 21 tablet 0   rosuvastatin (CRESTOR) 10 MG tablet Take 1 tablet (10 mg total) by mouth daily. 90 tablet 1   SUMAtriptan (IMITREX) 100 MG tablet Take 1 tablet (100 mg total) by mouth as needed for migraine. May repeat in 2 hours if headache persists or recurs. 10 tablet 11   valsartan (DIOVAN) 320 MG tablet Take 1 tablet (320 mg total) by mouth daily. 90 tablet 1   No facility-administered medications prior to visit.    Allergies  Allergen Reactions   Gabapentin     Made her "crazy"    ROS    See HPI Objective:    Physical Exam Constitutional:      General: She is not in acute distress.    Appearance: Normal appearance. She is well-developed.  HENT:     Head: Normocephalic and atraumatic.     Right Ear: External ear normal.     Left Ear: External ear normal.  Eyes:     General: No scleral icterus. Neck:     Thyroid: No thyromegaly.  Cardiovascular:     Rate and Rhythm: Normal rate and regular rhythm.      Heart sounds: Normal heart sounds. No murmur heard. Pulmonary:     Effort: Pulmonary effort is normal. No respiratory distress.     Breath sounds: Normal breath sounds. No wheezing.  Musculoskeletal:     Cervical back: Neck supple.  Skin:    General: Skin is warm and dry.  Neurological:     Mental Status: She is alert and oriented to person, place, and time.  Psychiatric:        Mood and Affect: Mood normal.        Behavior: Behavior normal.        Thought Content: Thought content normal.        Judgment: Judgment normal.     BP (!) 165/78   Pulse 84   Temp 99.7 F (37.6 C) (Oral)   Resp 16   Wt 212 lb (96.2 kg)   LMP 08/09/2016   SpO2 100%   BMI 40.06 kg/m  Wt Readings from Last 3 Encounters:  03/07/22 212 lb (96.2 kg)  12/22/21 211 lb (95.7 kg)  12/18/21 212 lb (96.2 kg)       Assessment & Plan:   Problem List Items Addressed This Visit       Unprioritized   Viral upper respiratory tract infection    Mild, continue supportive measures, call if symptoms do  not continue to improve.       Rheumatoid arthritis ([place])    Symptoms are much improved since she began [place]. Continue management per rheumatology.       Essential hypertension    BP is uncontrolled. Will add diltiazem for BP control. Cardiology previously preferred over amlodipine due to concern for rebound tachycardia on amlodipine.She will send me some updated BP and HR readings in a few days.       Relevant Medications   valsartan (DIOVAN) 320 MG tablet   rosuvastatin (CRESTOR) 10 MG tablet   hydrochlorothiazide (HYDRODIURIL) 25 MG tablet   carvedilol (COREG) 25 MG tablet   diltiazem (CARDIZEM CD) 120 MG 24 hr capsule   Controlled type 2 diabetes mellitus without complication, without long-term current use of insulin (HCC)    Diet controlled. Continue healthy diet, exercise and weight loss efforts.      Relevant Medications   valsartan (DIOVAN) 320 MG tablet   rosuvastatin (CRESTOR) 10  MG tablet   Other Relevant Orders   Urine Microalbumin w/creat. ratio (Completed)   Comp Met (CMET) (Completed)   HgB A1c (Completed)   B12 deficiency    B12 injection today and continue monthly.       Other Visit Diagnoses     Screening for colon cancer    -  Primary   Relevant Orders   Ambulatory referral to Gastroenterology       I have discontinued Elora D. Dike's dapagliflozin propanediol, HYDROcodone bit-homatropine, benzonatate, doxycycline, and methylPREDNISolone. I have also changed her hydrochlorothiazide. Additionally, I am having her start on diltiazem. Lastly, I am having her maintain her [place] Z, Vitamin B-12, calcium-vitamin D, aspirin EC, cyclobenzaprine, Emgality, nystatin cream, albuterol, Iron (Ferrous Sulfate), promethazine-dextromethorphan, Orencia ClickJect, valsartan, SUMAtriptan, rosuvastatin, fluticasone, and carvedilol. We administered cyanocobalamin.  Meds ordered this encounter  Medications   valsartan (DIOVAN) 320 MG tablet    Sig: Take 1 tablet (320 mg total) by mouth daily.    Dispense:  90 tablet    Refill:  1    Order Specific Question:   Supervising Provider    Answer:   Penni Homans A [[id number]]   SUMAtriptan (IMITREX) 100 MG tablet    Sig: Take 1 tablet (100 mg total) by mouth as needed for migraine. May repeat in 2 hours if headache persists or recurs.    Dispense:  10 tablet    Refill:  11    Order Specific Question:   Supervising Provider    Answer:   Penni Homans A [[id number]]   rosuvastatin (CRESTOR) 10 MG tablet    Sig: Take 1 tablet (10 mg total) by mouth daily.    Dispense:  90 tablet    Refill:  1    Order Specific Question:   Supervising Provider    Answer:   Penni Homans A [[id number]]   hydrochlorothiazide (HYDRODIURIL) 25 MG tablet    Sig: Take 1 tablet (25 mg total) by mouth daily.    Dispense:  90 tablet    Refill:  0    Order Specific Question:   Supervising Provider    Answer:   Penni Homans A [[id number]]   fluticasone  (FLONASE) 50 MCG/ACT nasal spray    Sig: Place 2 sprays into both nostrils daily.    Dispense:  16 g    Refill:  1    Order Specific Question:   Supervising Provider    Answer:   Penni Homans A [[id number]]   carvedilol (COREG) 25 MG  tablet    Sig: Take 1 tablet (25 mg total) by mouth 2 (two) times daily with a meal.    Dispense:  180 tablet    Refill:  1    Order Specific Question:   Supervising Provider    Answer:   Penni Homans A [[id number]]   diltiazem (CARDIZEM CD) 120 MG 24 hr capsule    Sig: Take 1 capsule (120 mg total) by mouth daily.    Dispense:  30 capsule    Refill:  5    Order Specific Question:   Supervising Provider    Answer:   Penni Homans A [[id number]]   cyanocobalamin (VITAMIN B12) injection 1,000 mcg

## 2022-03-08 ENCOUNTER — Ambulatory Visit
Admission: EM | Admit: 2022-03-08 | Discharge: 2022-03-08 | Disposition: A | Discharge status: Home / Self Care | Payer: 59 | Attending: Internal Medicine | Admitting: Internal Medicine

## 2022-03-08 ENCOUNTER — Ambulatory Visit: Payer: 59 | Admitting: Rheumatology

## 2022-03-08 DIAGNOSIS — U071 COVID-19: Secondary | ICD-10-CM

## 2022-03-08 DIAGNOSIS — R5383 Other fatigue: Secondary | ICD-10-CM

## 2022-03-08 DIAGNOSIS — M0579 Rheumatoid arthritis with rheumatoid factor of multiple sites without organ or systems involvement: Secondary | ICD-10-CM

## 2022-03-08 DIAGNOSIS — Z9889 Other specified postprocedural states: Secondary | ICD-10-CM

## 2022-03-08 DIAGNOSIS — M170 Bilateral primary osteoarthritis of knee: Secondary | ICD-10-CM

## 2022-03-08 DIAGNOSIS — R002 Palpitations: Secondary | ICD-10-CM

## 2022-03-08 DIAGNOSIS — M19071 Primary osteoarthritis, right ankle and foot: Secondary | ICD-10-CM

## 2022-03-08 DIAGNOSIS — I5032 Chronic diastolic (congestive) heart failure: Secondary | ICD-10-CM

## 2022-03-08 DIAGNOSIS — I2510 Atherosclerotic heart disease of native coronary artery without angina pectoris: Secondary | ICD-10-CM

## 2022-03-08 DIAGNOSIS — M542 Cervicalgia: Secondary | ICD-10-CM

## 2022-03-08 DIAGNOSIS — M722 Plantar fascial fibromatosis: Secondary | ICD-10-CM

## 2022-03-08 DIAGNOSIS — Z8261 Family history of arthritis: Secondary | ICD-10-CM

## 2022-03-08 DIAGNOSIS — Z8669 Personal history of other diseases of the nervous system and sense organs: Secondary | ICD-10-CM

## 2022-03-08 DIAGNOSIS — I10 Essential (primary) hypertension: Secondary | ICD-10-CM

## 2022-03-08 DIAGNOSIS — M3509 Sicca syndrome with other organ involvement: Secondary | ICD-10-CM

## 2022-03-08 DIAGNOSIS — Z79899 Other long term (current) drug therapy: Secondary | ICD-10-CM

## 2022-03-08 DIAGNOSIS — E119 Type 2 diabetes mellitus without complications: Secondary | ICD-10-CM

## 2022-03-08 MED ORDER — BENZONATATE 100 MG PO CAPS: 100.0000 mg | ORAL_CAPSULE | Freq: Three times a day (TID) | ORAL | 0 refills | Status: DC | PRN

## 2022-03-08 MED ORDER — ALBUTEROL SULFATE HFA 108 (90 BASE) MCG/ACT IN AERS: 2.0000 | INHALATION_SPRAY | Freq: Four times a day (QID) | RESPIRATORY_TRACT | 0 refills | Status: AC | PRN

## 2022-03-08 MED ORDER — NIRMATRELVIR/RITONAVIR (PAXLOVID)TABLET: 3.0000 | ORAL_TABLET | Freq: Two times a day (BID) | ORAL | 0 refills | Status: AC

## 2022-03-08 NOTE — ED Provider Notes (Addendum)
[facility]    CSN: [id number] Arrival date & time: 03/08/22  0908      History   Chief Complaint Chief Complaint  Patient presents with   Covid +    HPI Amber [name] is a 62 y.o. female.   Patient presents with 2-day history of nasal congestion, nonproductive cough, fever, chills, generalized bodyaches.  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  Tmax at home was 100 per patient.  She has had Coricidin HBP and Tylenol with minimal improvement in symptoms.  Denies chest pain, shortness of breath, gastrointestinal symptoms.  Patient reports history of asthma but has not had inhaler to use as she has currently ran out of the medication.  Reports that she has felt like she needed it at times.  Reports that she saw her PCP yesterday and was told that she had a viral illness but no medications were prescribed.  She took a COVID test at work today that was positive.  Blood pressure is slightly elevated.  Reports that she has not taken her blood pressure medication yet this morning.  Reports that her blood pressure has been about 150s at home.  Discussed with PCP yesterday who advised monitoring blood pressure at home and following up in a few weeks.  Denies headache, blurred vision.     Past Medical History:  Diagnosis Date   Anemia    Arthritis    Asthma 05/03/2020   Blood transfusion without reported diagnosis    CAD (coronary artery disease) 04/24/2017   Cerebrovascular disease 10/13/2020   Chronic diastolic CHF (congestive heart failure) ([place]) 04/24/2017   Colon polyps    Controlled type 2 diabetes mellitus without complication, without long-term current use of insulin ([place]) 05/31/2017   Diabetic peripheral neuropathy ([place]) 01/05/2020   Ectopic pregnancy    RIGHT salpingostomy   Elective abortion    Elective abortion 09/26/2020   RIGHT salpingostomy   Essential hypertension 09/04/2017   GERD  (gastroesophageal reflux disease)    Glaucoma    both eyes   Headache    migraines   History of chicken pox    History of chicken pox 09/26/2020   migraines   Hyperlipemia    Hypertension    Iron deficiency anemia 09/02/2013   Migraine 04/19/2014   Obesity (BMI 30-39.9) 10/17/2012   Palpitations 10/01/2012   Rheumatoid arthritis ([place]) 11/07/2011   Rheumatoid arthritis(714.0) 11/07/2011   SAB (spontaneous abortion)    SAB (spontaneous abortion) 11/07/2011   Shortness of breath    Sjogren's disease ([place])    Wheezing     Patient Active Problem List   Diagnosis Date Noted   Viral upper respiratory tract infection 03/07/2022   Cough 12/18/2021   High risk HPV infection 10/17/2020   Cerebrovascular disease 10/13/2020   Colon polyps 09/26/2020   GERD (gastroesophageal reflux disease) 09/26/2020   B12 deficiency 05/03/2020   Hyperlipidemia 05/03/2020   Asthma 05/03/2020   Diabetic peripheral neuropathy ([place]) 01/05/2020   Class 2 severe obesity with serious comorbidity and body mass index (BMI) of 38.0 to 38.9 in adult, unspecified obesity type ([place]) 01/05/2020   Adrenal mass, right ([place]) 01/05/2020   Hypertensive heart disease 09/04/2017   Controlled type 2 diabetes mellitus without complication, without long-term current use of insulin ([place]) 05/31/2017   Chronic diastolic CHF (congestive heart failure) ([place]) 04/24/2017   CAD (coronary artery disease) 04/24/2017   History of BCG vaccination 06/14/2016   Fibroids  07/27/2014   Migraine 04/19/2014   Elevated serum protein level 04/09/2014   Preventative health care 04/09/2014   Iron deficiency anemia 09/02/2013   Obesity (BMI 30-39.9) 10/17/2012   Glaucoma 11/07/2011   Rheumatoid arthritis ([place]) 11/07/2011   Essential hypertension 11/05/2011   Sjogren's disease ([place]) 11/05/2011   Anemia 09/22/2010   Fibroid uterus 09/22/2010    Past Surgical History:  Procedure Laterality Date   ABDOMINAL SURGERY     bowel repair    COLONOSCOPY     DILATION AND CURETTAGE OF UTERUS     X 2   MENISCUS REPAIR Right    MENISCUS TEAR REPAIR   MYOMECTOMY N/A 07/27/2014   Procedure: ABDOMINAL MYOMECTOMY;  Surgeon: Waymon Amato, MD;  Location: [facility];  Service: Gynecology;  Laterality: N/A;   PELVIC LAPAROSCOPY  1994   IN 1999 LAPAROSCOPY WITH INCIDENTAL ENTEROTOMY REQUIRING LAPAROTOMY   ROTATOR CUFF REPAIR Left 06/21/2021   UTERINE FIBROID SURGERY     July 2016    OB History     Gravida  3   Para  0   Term      Preterm      AB  2   Living  0      SAB      IAB      Ectopic  1   Multiple      Live Births               Home Medications    Prior to Admission medications   Medication Sig Start Date End Date Taking? Authorizing Provider  benzonatate (TESSALON) 100 MG capsule Take 1 capsule (100 mg total) by mouth every 8 (eight) hours as needed for cough. 03/08/22  Yes , Michele Rockers, FNP  nirmatrelvir/ritonavir (PAXLOVID) 20 x 150 MG & 10 x '100MG'$  TABS Take 3 tablets by mouth 2 (two) times daily for 5 days. Patient GFR is 103. Take nirmatrelvir (150 mg) two tablets twice daily for 5 days and ritonavir (100 mg) one tablet twice daily for 5 days. 03/08/22 03/13/22 Yes , Michele Rockers, FNP  Abatacept (ORENCIA CLICKJECT) 0000000 MG/ML SOAJ Inject '125mg'$  into the skin every 14 days. 02/19/22   Bo Merino, MD  albuterol (VENTOLIN HFA) 108 (90 Base) MCG/ACT inhaler Inhale 2 puffs into the lungs every 6 (six) hours as needed. 03/08/22   Teodora Medici, FNP  aspirin EC 81 MG tablet Take 1 tablet (81 mg total) by mouth daily. 02/01/20   [name], NP  calcium-vitamin D (OSCAL WITH D) 500-200 MG-UNIT TABS tablet Take by mouth.    [provider]  carvedilol (COREG) 25 MG tablet Take 1 tablet (25 mg total) by mouth 2 (two) times daily with a meal. 03/07/22   [name], NP  Cyanocobalamin (VITAMIN B-12) 5000 MCG LOZG Take 1 tablet by mouth 2 (two) times daily. 11/04/15   [provider]   cyclobenzaprine (FLEXERIL) 5 MG tablet Take 0.5 tablets (2.5 mg total) by mouth as needed for muscle spasms. 05/31/21   Genia Harold, MD  diltiazem (CARDIZEM CD) 120 MG 24 hr capsule Take 1 capsule (120 mg total) by mouth daily. 03/07/22   [name], NP  fluticasone (FLONASE) 50 MCG/ACT nasal spray Place 2 sprays into both nostrils daily. 03/07/22   [name], NP  Galcanezumab-gnlm (EMGALITY) 120 MG/ML SOAJ Inject 120 mg into the skin every 30 (thirty) days. 05/31/21   Genia Harold, MD  hydrochlorothiazide (HYDRODIURIL) 25 MG tablet Take 1 tablet (25 mg total)  by mouth daily. 03/07/22   [name], NP  Iron, Ferrous Sulfate, 325 (65 Fe) MG TABS Take 325 mg by mouth 2 (two) times daily. 11/07/21   [name], NP  nystatin cream (MYCOSTATIN) Apply 1 Application topically 2 (two) times daily. 08/07/21   [name], NP  promethazine-dextromethorphan (PROMETHAZINE-DM) 6.25-15 MG/5ML syrup Take 2.5-5 mLs by mouth 3 (three) times daily as needed for cough. 12/18/21   Mosie Lukes, MD  rosuvastatin (CRESTOR) 10 MG tablet Take 1 tablet (10 mg total) by mouth daily. 03/07/22 09/03/22  [name], NP  SUMAtriptan (IMITREX) 100 MG tablet Take 1 tablet (100 mg total) by mouth as needed for migraine. May repeat in 2 hours if headache persists or recurs. 03/07/22   [name], NP  TRAVATAN Z 0.004 % SOLN ophthalmic solution Place 1 drop into both eyes at bedtime.  10/25/11   [provider]  valsartan (DIOVAN) 320 MG tablet Take 1 tablet (320 mg total) by mouth daily. 03/07/22   [name], NP    Family History Family History  Problem Relation Age of Onset   Hypertension Mother        died from heart disease   Heart disease Mother        deceased, unknown cause   Stroke Mother    Obesity Mother    Diabetes Sister    Hypertension Sister    Esophageal cancer Sister    Stomach cancer Sister    Cancer Sister    Diabetes Sister     Rectal cancer Neg Hx    Colon cancer Neg Hx     Social History Social History   Tobacco Use   Smoking status: Never    Passive exposure: Never   Smokeless tobacco: Never  Vaping Use   Vaping Use: Never used  Substance Use Topics   Alcohol use: No    Alcohol/week: 0.0 standard drinks of alcohol   Drug use: No     Allergies   Gabapentin   Review of Systems Review of Systems Per HPI  Physical Exam Triage Vital Signs ED Triage Vitals  Enc Vitals Group     BP 03/08/22 0918 (!) 178/111     Pulse Rate 03/08/22 0918 93     Resp 03/08/22 0918 18     Temp 03/08/22 0918 99.1 F (37.3 C)     Temp Source 03/08/22 0918 Oral     SpO2 03/08/22 0918 96 %     Weight --      Height --      Head Circumference --      Peak Flow --      Pain Score 03/08/22 0917 5     Pain Loc --      Pain Edu? --      Excl. in [place]? --    No data found.  Updated Vital Signs BP (!) 133/90 (BP Location: Left Arm)   Pulse 93   Temp 99.1 F (37.3 C) (Oral)   Resp 18   LMP 08/09/2016   SpO2 96%   Visual Acuity Right Eye Distance:   Left Eye Distance:   Bilateral Distance:    Right Eye Near:   Left Eye Near:    Bilateral Near:     Physical Exam Constitutional:      General: She is not in acute distress.    Appearance: Normal appearance. She is not toxic-appearing or diaphoretic.  HENT:     Head: Normocephalic and atraumatic.  Right Ear: Tympanic membrane and ear canal normal.     Left Ear: Tympanic membrane and ear canal normal.     Nose: Congestion present.     Mouth/Throat:     Mouth: Mucous membranes are moist.     Pharynx: No posterior oropharyngeal erythema.  Eyes:     Extraocular Movements: Extraocular movements intact.     Conjunctiva/sclera: Conjunctivae normal.     Pupils: Pupils are equal, round, and reactive to light.  Cardiovascular:     Rate and Rhythm: Normal rate and regular rhythm.     Pulses: Normal pulses.     Heart sounds: Normal heart sounds.   Pulmonary:     Effort: Pulmonary effort is normal. No respiratory distress.     Breath sounds: Normal breath sounds. No stridor. No wheezing, rhonchi or rales.  Abdominal:     General: Abdomen is flat. Bowel sounds are normal.     Palpations: Abdomen is soft.  Musculoskeletal:        General: Normal range of motion.     Cervical back: Normal range of motion.  Skin:    General: Skin is warm and dry.  Neurological:     General: No focal deficit present.     Mental Status: She is alert and oriented to person, place, and time. Mental status is at baseline.  Psychiatric:        Mood and Affect: Mood normal.        Behavior: Behavior normal.      UC Treatments / Results  Labs (all labs ordered are listed, but only abnormal results are displayed) Labs Reviewed - No data to display  EKG   Radiology No results found.  Procedures Procedures (including critical care time)  Medications Ordered in UC Medications - No data to display  Initial Impression / Assessment and Plan / UC Course  I have reviewed the triage vital signs and the nursing notes.  Pertinent labs & imaging results that were available during my care of the patient were reviewed by me and considered in my medical decision making (see chart for details).     Patient tested positive for COVID-19 with an at-home test at her workplace.  Patient is a candidate for COVID antivirals.  Discussed COVID antiviral treatment with patient and she would like to move forward with Paxlovid.  Will treat with Paxlovid but patient does take rosuvastatin.  Advised patient to stop taking rosuvastatin temporarily while taking Paxlovid.  Advised patient it will be best to stop rosuvastatin for at least 5 days after completing paxlovid as well. She voiced understanding and was agreeable with this.  Patient's last GFR is normal so Paxlovid should be safe.  Will refill albuterol inhaler for patient to take as needed as well.  Cough medication  also prescribed for patient.  Blood pressure was much improved after recheck.  Advised patient to continue to monitor blood pressure at home.  Discussed strict return precautions.  Patient verbalized understanding and was agreeable with plan. Final Clinical Impressions(s) / UC Diagnoses   Final diagnoses:  [id number]     Discharge Instructions      I have prescribed COVID antiviral for you.  I have also refilled your albuterol inhaler and cough medication.  Please stop taking rosuvastatin while taking Paxlovid.  Follow-up if any symptoms persist or worsen.    ED Prescriptions     Medication Sig Dispense Auth. Provider   nirmatrelvir/ritonavir (PAXLOVID) 20 x 150 MG & 10 x '100MG'$  TABS  Take 3 tablets by mouth 2 (two) times daily for 5 days. Patient GFR is 103. Take nirmatrelvir (150 mg) two tablets twice daily for 5 days and ritonavir (100 mg) one tablet twice daily for 5 days. 30 tablet [place], Letha E, [place]   albuterol (VENTOLIN HFA) 108 (90 Base) MCG/ACT inhaler Inhale 2 puffs into the lungs every 6 (six) hours as needed. 18 g , Hildred Alamin E, [place]   benzonatate (TESSALON) 100 MG capsule Take 1 capsule (100 mg total) by mouth every 8 (eight) hours as needed for cough. 21 capsule [place], Michele Rockers, [place]      PDMP not reviewed this encounter.   Teodora Medici, [place] 03/08/22 1017    [address], [place] 03/08/22 1018

## 2022-03-08 NOTE — ED Triage Notes (Signed)
Pt presents covid positive as of this morning; pt states she has had a non productive cough, nasal drainage, fever at home, chills, and generalized body aches X 2 days.

## 2022-03-08 NOTE — Discharge Instructions (Signed)
I have prescribed COVID antiviral for you.  I have also refilled your albuterol inhaler and cough medication.  Please stop taking rosuvastatin while taking Paxlovid.  Follow-up if any symptoms persist or worsen.

## 2022-04-03 NOTE — Progress Notes (Signed)
Office Visit Note  Patient: Amber [name]             Date of Birth: 26-Jan-1960           MRN: [id number]             PCP: Sandford Craze, NP Referring: Sandford Craze, NP Visit Date: 04/17/2022 Occupation: @GUAROCC @  Subjective:  Pain in joints and stiffness  History of Present Illness: Amber [name] is a 62 y.o. female with history of rheumatoid arthritis and osteoarthritis.  Patient states that she fell at work on March 28.  She landed on her right knee and her right hip.  She has been having pain in her right knee, right hip and lower back since then.  She was evaluated at the urgent care with the x-rays were normal.  As she has ongoing pain and discomfort she states the MRI of her lumbar spine and her right knee joint is pending.  She has not seen a flare of rheumatoid arthritis but she continues to have some pain and discomfort in her joints.  She has been taking Orencia injections every 10 days without interruption.  She continues to have dry mouth and dry eye symptoms.  She continues to have some discomfort in the left shoulder joint.  She continues to have some stiffness in her neck.  She has not had a flare of Planter fasciitis.    Activities of Daily Living:  Patient reports morning stiffness for a few minutes.   Patient Denies nocturnal pain.  Difficulty dressing/grooming: Denies Difficulty climbing stairs: Reports Difficulty getting out of chair: Reports Difficulty using hands for taps, buttons, cutlery, and/or writing: Denies  Review of Systems  Constitutional:  Positive for fatigue.  HENT:  Positive for mouth dryness. Negative for mouth sores.   Eyes:  Positive for dryness.  Respiratory:  Negative for shortness of breath and difficulty breathing.   Cardiovascular:  Negative for chest pain and palpitations.  Gastrointestinal:  Negative for blood in stool, constipation and diarrhea.  Endocrine: Negative for increased urination.  Genitourinary:   Negative for involuntary urination.  Musculoskeletal:  Positive for joint pain, joint pain, joint swelling, myalgias, morning stiffness, muscle tenderness and myalgias. Negative for gait problem and muscle weakness.  Skin:  Positive for hair loss. Negative for color change, rash and sensitivity to sunlight.  Allergic/Immunologic: Positive for susceptible to infections.  Neurological:  Positive for dizziness and headaches.  Hematological:  Negative for swollen glands.  Psychiatric/Behavioral:  Negative for depressed mood and sleep disturbance. The patient is not nervous/anxious.     PMFS History:  Patient Active Problem List   Diagnosis Date Noted   Viral upper respiratory tract infection 03/07/2022   Cough 12/18/2021   High risk HPV infection 10/17/2020   Cerebrovascular disease 10/13/2020   Colon polyps 09/26/2020   GERD (gastroesophageal reflux disease) 09/26/2020   B12 deficiency 05/03/2020   Hyperlipidemia 05/03/2020   Asthma 05/03/2020   Diabetic peripheral neuropathy 01/05/2020   Class 2 severe obesity with serious comorbidity and body mass index (BMI) of 38.0 to 38.9 in adult, unspecified obesity type 01/05/2020   Adrenal mass, right 01/05/2020   Hypertensive heart disease 09/04/2017   Controlled type 2 diabetes mellitus without complication, without long-term current use of insulin 05/31/2017   Chronic diastolic CHF (congestive heart failure) 04/24/2017   CAD (coronary artery disease) 04/24/2017   History of BCG vaccination 06/14/2016   Fibroids 07/27/2014   Migraine 04/19/2014   Elevated serum protein level  04/09/2014   Preventative health care 04/09/2014   Iron deficiency anemia 09/02/2013   Obesity (BMI 30-39.9) 10/17/2012   Glaucoma 11/07/2011   Rheumatoid arthritis 11/07/2011   Essential hypertension 11/05/2011   Sjogren's disease 11/05/2011   Anemia 09/22/2010   Fibroid uterus 09/22/2010    Past Medical History:  Diagnosis Date   Anemia    Arthritis     Asthma 05/03/2020   Blood transfusion without reported diagnosis    CAD (coronary artery disease) 04/24/2017   Cerebrovascular disease 10/13/2020   Chronic diastolic CHF (congestive heart failure) 04/24/2017   Colon polyps    Controlled type 2 diabetes mellitus without complication, without long-term current use of insulin 05/31/2017   Diabetic peripheral neuropathy 01/05/2020   Ectopic pregnancy    RIGHT salpingostomy   Elective abortion    Elective abortion 09/26/2020   RIGHT salpingostomy   Essential hypertension 09/04/2017   GERD (gastroesophageal reflux disease)    Glaucoma    both eyes   Headache    migraines   History of chicken pox    History of chicken pox 09/26/2020   migraines   Hyperlipemia    Hypertension    Iron deficiency anemia 09/02/2013   Migraine 04/19/2014   Obesity (BMI 30-39.9) 10/17/2012   Palpitations 10/01/2012   Rheumatoid arthritis 11/07/2011   Rheumatoid arthritis(714.0) 11/07/2011   SAB (spontaneous abortion)    SAB (spontaneous abortion) 11/07/2011   Shortness of breath    Sjogren's disease    Wheezing     Family History  Problem Relation Age of Onset   Hypertension Mother        died from heart disease   Heart disease Mother        deceased, unknown cause   Stroke Mother    Obesity Mother    Diabetes Sister    Hypertension Sister    Esophageal cancer Sister    Stomach cancer Sister    Cancer Sister    Diabetes Sister    Rectal cancer Neg Hx    Colon cancer Neg Hx    Past Surgical History:  Procedure Laterality Date   ABDOMINAL SURGERY     bowel repair   COLONOSCOPY     DILATION AND CURETTAGE OF UTERUS     X 2   MENISCUS REPAIR Right    MENISCUS TEAR REPAIR   MYOMECTOMY N/A 07/27/2014   Procedure: ABDOMINAL MYOMECTOMY;  Surgeon: Hoover Browns, MD;  Location: [facility];  Service: Gynecology;  Laterality: N/A;   PELVIC LAPAROSCOPY  1994   IN 1999 LAPAROSCOPY WITH INCIDENTAL ENTEROTOMY REQUIRING LAPAROTOMY   ROTATOR CUFF REPAIR Left 06/21/2021    UTERINE FIBROID SURGERY     July 2016   Social History   Social History Narrative   Regular exercise:  No   Caffeine Use: 2 cups coffee daily   No children   "Happily divorced"   Reports that she is involved at her church   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  Had a sister up in [place] who passed away 2015-04-27 who she was close to.   Born in [place]- she came to [place] as adult.  Age 89.    Left-handed.         Immunization History  Administered Date(s) Administered   COVID-19, mRNA, vaccine(Comirnaty)12 years and older 10/03/2020   Hepatitis A, Adult 11/01/2017, 12/03/2018   IPV 11/01/2017   Influenza Split 11/07/2011   Influenza,inj,Quad PF,6+ Mos 10/17/2012, 01/15/2014, 08/31/2014, 09/30/2017, 09/10/2018, 09/21/2019, 10/07/2020, 11/07/2021  Meningococcal Mcv4o 11/01/2017   Moderna Sars-Covid-2 Vaccination 01/28/2019, 02/27/2019, 11/27/2019   Pneumococcal Conjugate-13 08/05/2020   Pneumococcal Polysaccharide-23 09/10/2018   Tdap 06/01/2011, 11/25/2012   Typhoid Live 11/01/2017   Zoster Recombinat (Shingrix) 10/04/2017, 12/04/2017     Objective: Vital Signs: BP 138/84 (BP Location: Left Arm, Patient Position: Sitting, Cuff Size: Large)   Pulse 66   Resp 17   Ht 5\' 1"  (1.549 m)   Wt 210 lb 6.4 oz (95.4 kg)   LMP 08/09/2016   BMI 39.75 kg/m    Physical Exam Vitals and nursing note reviewed.  Constitutional:      Appearance: She is well-developed.  HENT:     Head: Normocephalic and atraumatic.  Eyes:     Conjunctiva/sclera: Conjunctivae normal.  Cardiovascular:     Rate and Rhythm: Normal rate and regular rhythm.     Heart sounds: Normal heart sounds.  Pulmonary:     Effort: Pulmonary effort is normal.     Breath sounds: Normal breath sounds.  Abdominal:     General: Bowel sounds are normal.     Palpations: Abdomen is soft.  Musculoskeletal:     Cervical back: Normal range of motion.  Lymphadenopathy:     Cervical: No cervical adenopathy.  Skin:     General: Skin is warm and dry.     Capillary Refill: Capillary refill takes less than 2 seconds.  Neurological:     Mental Status: She is alert and oriented to person, place, and time.  Psychiatric:        Behavior: Behavior normal.      Musculoskeletal Exam: Cervical spine is in good range of motion.  She had painful limited range of motion of the lumbar spine with tenderness in the lower lumbar region.  Shoulder joints, elbow joints, wrist joints, MCPs PIPs and DIPs been good range of motion with no synovitis.  She had discomfort range of motion of her left shoulder joint.  Hip joints were in good range of motion with tenderness over the right gluteal region.  Knee joints in good range of motion without any warmth swelling or effusion.  There was no tenderness over ankles or MTPs.  CDAI Exam: CDAI Score: -- Patient Global: 2 mm; Provider Global: 2 mm Swollen: --; Tender: -- Joint Exam 04/17/2022   No joint exam has been documented for this visit   There is currently no information documented on the homunculus. Go to the Rheumatology activity and complete the homunculus joint exam.  Investigation: No additional findings.  Imaging: No results found.  Recent Labs: Lab Results  Component Value Date   WBC 3.1 (L) 02/16/2022   HGB 13.4 02/16/2022   PLT 187 02/16/2022   NA 138 03/07/2022   K 3.8 03/07/2022   CL 103 03/07/2022   CO2 28 03/07/2022   GLUCOSE 120 (H) 03/07/2022   BUN 11 03/07/2022   CREATININE 0.70 03/07/2022   BILITOT 0.7 03/07/2022   ALKPHOS 49 03/07/2022   AST 16 03/07/2022   ALT 19 03/07/2022   PROT 7.5 03/07/2022   ALBUMIN 3.6 03/07/2022   CALCIUM 9.5 03/07/2022   GFRAA 115 02/19/2020   QFTBGOLD Negative 06/14/2016   QFTBGOLDPLUS NEGATIVE 02/16/2022    Speciality Comments: PLQ eye exam: 12/09/2018 normal. [facility]  Procedures:  No procedures performed Allergies: Gabapentin   Assessment / Plan:     Visit Diagnoses: Rheumatoid arthritis  involving multiple sites with positive rheumatoid factor - Positive RF, negative anti-CCP, -14 3 3  ETA, diagnosed  2010 by Dr. Oleta Mouse, later Dr. Dierdre Forth: She had been taking Orencia 125 mg subcu every 10 days without any interruption.  She had no synovitis on examination.  Patient has been experiencing some generalized discomfort from her recent fall but no joint swelling.  High risk medication use - Orencia 125 mg subcutaneous every 10 days. Initially started on Orencia 01/28/20. (methotrexate was discontinued due to neutropenia).  Labs obtained on February 16, 2022 WBC count was low at 3.1 hemoglobin and platelets were normal.  CMP was normal.  TB Gold was negative on February 16, 2022.  She was advised to get TB Gold annually.  Labs will be due in May and then every 3 months to monitor for drug toxicity.  She was advised to hold Orencia if she develops an infection.  Information on immunization was placed in the AVS.    Sjogren's syndrome with other organ involvement - Positive SSA, positive SSB: Continues to have sicca symptoms.  Over-the-counter products were discussed at length.  S/P rotator cuff repair left - June 21, 2021.  She continues to have some discomfort.  Primary osteoarthritis of both knees-she had good range of motion of her bilateral knee joints.  She complains of discomfort in the right knee joint since her recent fall.  No warmth swelling or effusion was noted.  Primary osteoarthritis of both feet-she had no tenderness over ankles or MTPs.  Plantar fasciitis of right foot - Followed by Dr. Charlsie Merles.  Patient denies any recent episodes of Planter fasciitis.  Neck pain - Followed by Dr. Lucie Leather.  Acute midline low back pain with right-sided sciatica-she has been having increased lower back pain since her fall in March.  She states she will be getting MRI through her doctor.  History of recent fall-patient fell in the last week of March at her work.  She is currently on medical  leave.  Family history of rheumatoid arthritis  Osteoporosis screening-I will schedule DEXA scan to screen for bone density.  Postmenopausal  Other medical problems are listed as follows:  Coronary artery disease involving native coronary artery of native heart without angina pectoris  Essential hypertension-blood pressure was elevated at 143/87.  Repeat blood pressure was 134/84.  She was advised to monitor blood pressure closely and follow-up with her PCP.  Palpitations  Chronic diastolic CHF (congestive heart failure)  Controlled type 2 diabetes mellitus without complication, without long-term current use of insulin  Hx of migraines  Orders: No orders of the defined types were placed in this encounter.  No orders of the defined types were placed in this encounter.    Follow-Up Instructions: Return in about 5 months (around 09/17/2022) for Rheumatoid arthritis.   Pollyann Savoy, MD  Note - This record has been created using Animal nutritionist.  Chart creation errors have been sought, but may not always  have been located. Such creation errors do not reflect on  the standard of medical care.

## 2022-04-06 ENCOUNTER — Encounter: Payer: Self-pay | Admitting: Oncology

## 2022-04-09 ENCOUNTER — Ambulatory Visit: Payer: 59 | Admitting: Family

## 2022-04-09 NOTE — Progress Notes (Shared)
Subjective:   By signing my name below, I, Barrett Shell, attest that this documentation has been prepared under the direction and in the presence of Sandford Craze, NP. 04/09/2022   Patient ID: Amber [name], female    DOB: October 27, 1960, 62 y.o.   MRN: [id number]  No chief complaint on file.   HPI Patient is in today for a follow-up appointment.  Past Medical History:  Diagnosis Date   Anemia    Arthritis    Asthma 05/03/2020   Blood transfusion without reported diagnosis    CAD (coronary artery disease) 04/24/2017   Cerebrovascular disease 10/13/2020   Chronic diastolic CHF (congestive heart failure) (HCC) 04/24/2017   Colon polyps    Controlled type 2 diabetes mellitus without complication, without long-term current use of insulin (HCC) 05/31/2017   Diabetic peripheral neuropathy (HCC) 01/05/2020   Ectopic pregnancy    RIGHT salpingostomy   Elective abortion    Elective abortion 09/26/2020   RIGHT salpingostomy   Essential hypertension 09/04/2017   GERD (gastroesophageal reflux disease)    Glaucoma    both eyes   Headache    migraines   History of chicken pox    History of chicken pox 09/26/2020   migraines   Hyperlipemia    Hypertension    Iron deficiency anemia 09/02/2013   Migraine 04/19/2014   Obesity (BMI 30-39.9) 10/17/2012   Palpitations 10/01/2012   Rheumatoid arthritis (HCC) 11/07/2011   Rheumatoid arthritis(714.0) 11/07/2011   SAB (spontaneous abortion)    SAB (spontaneous abortion) 11/07/2011   Shortness of breath    Sjogren's disease (HCC)    Wheezing     Past Surgical History:  Procedure Laterality Date   ABDOMINAL SURGERY     bowel repair   COLONOSCOPY     DILATION AND CURETTAGE OF UTERUS     X 2   MENISCUS REPAIR Right    MENISCUS TEAR REPAIR   MYOMECTOMY N/A 07/27/2014   Procedure: ABDOMINAL MYOMECTOMY;  Surgeon: Hoover Browns, MD;  Location: [facility];  Service: Gynecology;  Laterality: N/A;   PELVIC LAPAROSCOPY  1994   IN 1999 LAPAROSCOPY WITH  INCIDENTAL ENTEROTOMY REQUIRING LAPAROTOMY   ROTATOR CUFF REPAIR Left 06/21/2021   UTERINE FIBROID SURGERY     July 2016    Family History  Problem Relation Age of Onset   Hypertension Mother        died from heart disease   Heart disease Mother        deceased, unknown cause   Stroke Mother    Obesity Mother    Diabetes Sister    Hypertension Sister    Esophageal cancer Sister    Stomach cancer Sister    Cancer Sister    Diabetes Sister    Rectal cancer Neg Hx    Colon cancer Neg Hx     Social History   Socioeconomic History   Marital status: Single    Spouse name: Not on file   Number of children: 0   Years of education: college   Highest education level: Not on file  Occupational History   Occupation: LPN    Employer: [employer]  Tobacco Use   Smoking status: Never    Passive exposure: Never   Smokeless tobacco: Never  Vaping Use   Vaping Use: Never used  Substance and Sexual Activity   Alcohol use: No    Alcohol/week: 0.0 standard drinks of alcohol   Drug use: No   Sexual activity: Yes  Birth control/protection: None    Comment: 1st intercourse- 17, partners- 5  Other Topics Concern   Not on file  Social History Narrative   Regular exercise:  No   Caffeine Use: 2 cups coffee daily   No children   "Happily divorced"   Reports that she is involved at her church   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  Had a sister up in [place] who passed away 2017 who she was close to.   Born [place]- she came [place].  Age 62.    Left-handed.         Social Determinants of Health   Financial Resource Strain: Not on file  Food Insecurity: Not on file  Transportation Needs: Not on file  Physical Activity: Not on file  Stress: Not on file  Social Connections: Not on file  Intimate Partner Violence: Not on file    Outpatient Medications Prior to Visit  Medication Sig Dispense Refill   Abatacept (ORENCIA CLICKJECT) 125 MG/ML SOAJ Inject  125mg  into the skin every 14 days. 6 mL 0   albuterol (VENTOLIN HFA) 108 (90 Base) MCG/ACT inhaler Inhale 2 puffs into the lungs every 6 (six) hours as needed. 18 g 0   aspirin EC 81 MG tablet Take 1 tablet (81 mg total) by mouth daily. 90 tablet 3   benzonatate (TESSALON) 100 MG capsule Take 1 capsule (100 mg total) by mouth every 8 (eight) hours as needed for cough. 21 capsule 0   calcium-vitamin D (OSCAL WITH D) 500-200 MG-UNIT TABS tablet Take by mouth.     carvedilol (COREG) 25 MG tablet Take 1 tablet (25 mg total) by mouth 2 (two) times daily with a meal. 180 tablet 1   Cyanocobalamin (VITAMIN B-12) 5000 MCG LOZG Take 1 tablet by mouth 2 (two) times daily.     cyclobenzaprine (FLEXERIL) 5 MG tablet Take 0.5 tablets (2.5 mg total) by mouth as needed for muscle spasms. 30 tablet 5   diltiazem (CARDIZEM CD) 120 MG 24 hr capsule Take 1 capsule (120 mg total) by mouth daily. 30 capsule 5   fluticasone (FLONASE) 50 MCG/ACT nasal spray Place 2 sprays into both nostrils daily. 16 g 1   Galcanezumab-gnlm (EMGALITY) 120 MG/ML SOAJ Inject 120 mg into the skin every 30 (thirty) days. 1.12 mL 11   hydrochlorothiazide (HYDRODIURIL) 25 MG tablet Take 1 tablet (25 mg total) by mouth daily. 90 tablet 0   Iron, Ferrous Sulfate, 325 (65 Fe) MG TABS Take 325 mg by mouth 2 (two) times daily. 30 tablet    nystatin cream (MYCOSTATIN) Apply 1 Application topically 2 (two) times daily. 30 g 0   promethazine-dextromethorphan (PROMETHAZINE-DM) 6.25-15 MG/5ML syrup Take 2.5-5 mLs by mouth 3 (three) times daily as needed for cough. 240 mL 0   rosuvastatin (CRESTOR) 10 MG tablet Take 1 tablet (10 mg total) by mouth daily. 90 tablet 1   SUMAtriptan (IMITREX) 100 MG tablet Take 1 tablet (100 mg total) by mouth as needed for migraine. May repeat in 2 hours if headache persists or recurs. 10 tablet 11   TRAVATAN Z 0.004 % SOLN ophthalmic solution Place 1 drop into both eyes at bedtime.      valsartan (DIOVAN) 320 MG tablet  Take 1 tablet (320 mg total) by mouth daily. 90 tablet 1   No facility-administered medications prior to visit.    Allergies  Allergen Reactions   Gabapentin     Made her "crazy"  ROS     Objective:    Physical Exam Constitutional:      General: She is not in acute distress.    Appearance: Normal appearance.  HENT:     Head: Normocephalic and atraumatic.     Right Ear: External ear normal.     Left Ear: External ear normal.  Eyes:     Extraocular Movements: Extraocular movements intact.     Pupils: Pupils are equal, round, and reactive to light.  Cardiovascular:     Rate and Rhythm: Normal rate and regular rhythm.     Heart sounds: Normal heart sounds. No murmur heard.    No gallop.  Pulmonary:     Effort: Pulmonary effort is normal. No respiratory distress.     Breath sounds: Normal breath sounds. No wheezing or rales.  Skin:    General: Skin is warm.  Neurological:     Mental Status: She is alert and oriented to person, place, and time.  Psychiatric:        Judgment: Judgment normal.     LMP 08/09/2016  Wt Readings from Last 3 Encounters:  03/07/22 212 lb (96.2 kg)  12/22/21 211 lb (95.7 kg)  12/18/21 212 lb (96.2 kg)       Assessment & Plan:  There are no diagnoses linked to this encounter.  I, Barrett Shell, personally preformed the services described in this documentation.  All medical record entries made by the scribe were at my direction and in my presence.  I have reviewed the chart and discharge instructions (if applicable) and agree that the record reflects my personal performance and is accurate and complete. 04/09/2022  Barrett Shell   Mercer Pod as a scribe for Lemont Fillers, NP.,have documented all relevant documentation on the behalf of Lemont Fillers, NP,as directed by  Lemont Fillers, NP while in the presence of Lemont Fillers, NP.

## 2022-04-09 NOTE — Progress Notes (Signed)
Subjective:   By signing my name below, I, Isabelle Course, attest that this documentation has been prepared under the direction and in the presence of Lemont Fillers, NP 04/10/22   Patient ID: Amber [name], female    DOB: 1960/01/19, 62 y.o.   MRN: [id number]  No chief complaint on file.   HPI Patient is in today for a 1 month follow up.   Hypertension: She reports she has quit working nights. She is compliant with 120 mg Diltiazem daily, which is helping.  BP Readings from Last 3 Encounters:  04/10/22 135/70  03/08/22 (!) 133/90  03/07/22 (!) 165/78   Diabetes: She has not previously taken any weight loss medications. Her last A1C was elevated at 6.6. Lab Results  Component Value Date   HGBA1C 6.6 (H) 03/07/2022   Fall: She reports a fall while at work on 03/29/22 at work due to salad dressing residue on the floor. She endorses pain in her right knee, neck, and coccyx. She denies any fractures. She was seen at [facility] as part of her workman's comp on 03/29/22. They did multiple x-rays which were negative for fractures, but she still has some soreness.    Past Medical History:  Diagnosis Date   Anemia    Arthritis    Asthma 05/03/2020   Blood transfusion without reported diagnosis    CAD (coronary artery disease) 04/24/2017   Cerebrovascular disease 10/13/2020   Chronic diastolic CHF (congestive heart failure) 04/24/2017   Colon polyps    Controlled type 2 diabetes mellitus without complication, without long-term current use of insulin 05/31/2017   Diabetic peripheral neuropathy 01/05/2020   Ectopic pregnancy    RIGHT salpingostomy   Elective abortion    Elective abortion 09/26/2020   RIGHT salpingostomy   Essential hypertension 09/04/2017   GERD (gastroesophageal reflux disease)    Glaucoma    both eyes   Headache    migraines   History of chicken pox    History of chicken pox 09/26/2020   migraines   Hyperlipemia    Hypertension    Iron deficiency anemia  09/02/2013   Migraine 04/19/2014   Obesity (BMI 30-39.9) 10/17/2012   Palpitations 10/01/2012   Rheumatoid arthritis 11/07/2011   Rheumatoid arthritis(714.0) 11/07/2011   SAB (spontaneous abortion)    SAB (spontaneous abortion) 11/07/2011   Shortness of breath    Sjogren's disease    Wheezing     Past Surgical History:  Procedure Laterality Date   ABDOMINAL SURGERY     bowel repair   COLONOSCOPY     DILATION AND CURETTAGE OF UTERUS     X 2   MENISCUS REPAIR Right    MENISCUS TEAR REPAIR   MYOMECTOMY N/A 07/27/2014   Procedure: ABDOMINAL MYOMECTOMY;  Surgeon: Hoover Browns, MD;  Location: [facility];  Service: Gynecology;  Laterality: N/A;   PELVIC LAPAROSCOPY  1994   IN 1999 LAPAROSCOPY WITH INCIDENTAL ENTEROTOMY REQUIRING LAPAROTOMY   ROTATOR CUFF REPAIR Left 06/21/2021   UTERINE FIBROID SURGERY     July 2016    Family History  Problem Relation Age of Onset   Hypertension Mother        died from heart disease   Heart disease Mother        deceased, unknown cause   Stroke Mother    Obesity Mother    Diabetes Sister    Hypertension Sister    Esophageal cancer Sister    Stomach cancer Sister    Cancer Sister  Diabetes Sister    Rectal cancer Neg Hx    Colon cancer Neg Hx     Social History   Socioeconomic History   Marital status: Single    Spouse name: Not on file   Number of children: 0   Years of education: college   Highest education level: Associate degree: occupational, Scientist, product/process development, or vocational program  Occupational History   Occupation: LPN    Employer: [employer]  Tobacco Use   Smoking status: Never    Passive exposure: Never   Smokeless tobacco: Never  Vaping Use   Vaping Use: Never used  Substance and Sexual Activity   Alcohol use: No    Alcohol/week: 0.0 standard drinks of alcohol   Drug use: No   Sexual activity: Yes    Birth control/protection: None    Comment: 1st intercourse- 17, partners- 5  Other Topics Concern   Not on file  Social History  Narrative   Regular exercise:  No   Caffeine Use: 2 cups coffee daily   No children   "Happily divorced"   Reports that she is involved at her church   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  Had a sister up in [place] who passed away 02-05-2015 who she was close to.   Born in [place]- she came to [place] as adult.  Age 9.    Left-handed.         Social Determinants of Health   Financial Resource Strain: Not on file  Food Insecurity: Food Insecurity Present (04/09/2022)   Hunger Vital Sign    Worried About Running Out of Food in the Last Year: Sometimes true    Ran Out of Food in the Last Year: Sometimes true  Transportation Needs: No Transportation Needs (04/09/2022)   PRAPARE - Administrator, Civil Service (Medical): No    Lack of Transportation (Non-Medical): No  Physical Activity: Sufficiently Active (04/09/2022)   Exercise Vital Sign    Days of Exercise per Week: 2 days    Minutes of Exercise per Session: 120 min  Stress: Stress Concern Present (04/09/2022)   [employer] of Occupational Health - Occupational Stress Questionnaire    Feeling of Stress : To some extent  Social Connections: Unknown (04/09/2022)   Social Connection and Isolation Panel [NHANES]    Frequency of Communication with Friends and Family: More than three times a week    Frequency of Social Gatherings with Friends and Family: Once a week    Attends Religious Services: More than 4 times per year    Active Member of [employer] or Organizations: Not on file    Attends Banker Meetings: Not on file    Marital Status: Divorced  Intimate Partner Violence: Not on file    Outpatient Medications Prior to Visit  Medication Sig Dispense Refill   Abatacept (ORENCIA CLICKJECT) 125 MG/ML SOAJ Inject 125mg  into the skin every 14 days. 6 mL 0   albuterol (VENTOLIN HFA) 108 (90 Base) MCG/ACT inhaler Inhale 2 puffs into the lungs every 6 (six) hours as needed. 18 g 0   aspirin EC 81 MG tablet Take 1  tablet (81 mg total) by mouth daily. 90 tablet 3   benzonatate (TESSALON) 100 MG capsule Take 1 capsule (100 mg total) by mouth every 8 (eight) hours as needed for cough. 21 capsule 0   calcium-vitamin D (OSCAL WITH D) 500-200 MG-UNIT TABS tablet Take by mouth.     carvedilol (COREG) 25 MG  tablet Take 1 tablet (25 mg total) by mouth 2 (two) times daily with a meal. 180 tablet 1   Cyanocobalamin (VITAMIN B-12) 5000 MCG LOZG Take 1 tablet by mouth 2 (two) times daily.     cyclobenzaprine (FLEXERIL) 5 MG tablet Take 0.5 tablets (2.5 mg total) by mouth as needed for muscle spasms. 30 tablet 5   diltiazem (CARDIZEM CD) 120 MG 24 hr capsule Take 1 capsule (120 mg total) by mouth daily. 30 capsule 5   fluticasone (FLONASE) 50 MCG/ACT nasal spray Place 2 sprays into both nostrils daily. 16 g 1   Galcanezumab-gnlm (EMGALITY) 120 MG/ML SOAJ Inject 120 mg into the skin every 30 (thirty) days. 1.12 mL 11   hydrochlorothiazide (HYDRODIURIL) 25 MG tablet Take 1 tablet (25 mg total) by mouth daily. 90 tablet 0   Iron, Ferrous Sulfate, 325 (65 Fe) MG TABS Take 325 mg by mouth 2 (two) times daily. 30 tablet    nystatin cream (MYCOSTATIN) Apply 1 Application topically 2 (two) times daily. 30 g 0   promethazine-dextromethorphan (PROMETHAZINE-DM) 6.25-15 MG/5ML syrup Take 2.5-5 mLs by mouth 3 (three) times daily as needed for cough. 240 mL 0   rosuvastatin (CRESTOR) 10 MG tablet Take 1 tablet (10 mg total) by mouth daily. 90 tablet 1   SUMAtriptan (IMITREX) 100 MG tablet Take 1 tablet (100 mg total) by mouth as needed for migraine. May repeat in 2 hours if headache persists or recurs. 10 tablet 11   TRAVATAN Z 0.004 % SOLN ophthalmic solution Place 1 drop into both eyes at bedtime.      valsartan (DIOVAN) 320 MG tablet Take 1 tablet (320 mg total) by mouth daily. 90 tablet 1   No facility-administered medications prior to visit.    Allergies  Allergen Reactions   Gabapentin     Made her "crazy"    Review of  Systems  Musculoskeletal:  Positive for falls and myalgias (due to fall - right knee, neck, and coccyx).       Objective:    Physical Exam Constitutional:      General: She is not in acute distress.    Appearance: Normal appearance. She is well-developed.  HENT:     Head: Normocephalic and atraumatic.     Right Ear: External ear normal.     Left Ear: External ear normal.  Eyes:     General: No scleral icterus. Neck:     Thyroid: No thyromegaly.  Cardiovascular:     Rate and Rhythm: Normal rate and regular rhythm.     Heart sounds: Normal heart sounds. No murmur heard. Pulmonary:     Effort: Pulmonary effort is normal. No respiratory distress.     Breath sounds: Normal breath sounds. No wheezing.  Musculoskeletal:     Cervical back: Neck supple.  Skin:    General: Skin is warm and dry.  Neurological:     Mental Status: She is alert and oriented to person, place, and time.  Psychiatric:        Mood and Affect: Mood normal.        Behavior: Behavior normal.        Thought Content: Thought content normal.        Judgment: Judgment normal.     BP 135/70 (BP Location: Right Arm, Patient Position: Sitting, Cuff Size: Large)   Pulse 69   Temp 98.3 F (36.8 C) (Oral)   Resp 16   Wt 214 lb (97.1 kg)   LMP 08/09/2016  SpO2 98%   BMI 40.43 kg/m  Wt Readings from Last 3 Encounters:  04/10/22 214 lb (97.1 kg)  03/07/22 212 lb (96.2 kg)  12/22/21 211 lb (95.7 kg)       Assessment & Plan:  B12 deficiency Assessment & Plan: B12 injection today. Continue monthly.   Orders: -     Cyanocobalamin  Controlled type 2 diabetes mellitus without complication, without long-term current use of insulin Assessment & Plan: Lab Results  Component Value Date   HGBA1C 6.6 (H) 03/07/2022   A1C is at goal. She is currently uninsured. When she gets her new insurance next month, will see if we can get a GLP-1 covered for her to assist with sugar and weight loss.    Essential  hypertension Assessment & Plan: BP Readings from Last 3 Encounters:  04/10/22 135/70  03/08/22 (!) 133/90  03/07/22 (!) 165/78   Follow up BP is improved since adding diltiazem CD- will continue.       I,Rachel Rivera,acting as a Neurosurgeon for Lemont Fillers, NP.,have documented all relevant documentation on the behalf of Lemont Fillers, NP,as directed by  Lemont Fillers, NP while in the presence of Lemont Fillers, NP.   I, Lemont Fillers, NP, personally preformed the services described in this documentation.  All medical record entries made by the scribe were at my direction and in my presence.  I have reviewed the chart and discharge instructions (if applicable) and agree that the record reflects my personal performance and is accurate and complete. 04/10/22   Lemont Fillers, NP

## 2022-04-10 ENCOUNTER — Ambulatory Visit (INDEPENDENT_AMBULATORY_CARE_PROVIDER_SITE_OTHER): Payer: Self-pay | Admitting: Family

## 2022-04-10 VITALS — BP 135/70 | HR 69 | Temp 98.3°F | Resp 16 | Wt 214.0 lb

## 2022-04-10 DIAGNOSIS — E538 Deficiency of other specified B group vitamins: Secondary | ICD-10-CM

## 2022-04-10 DIAGNOSIS — I10 Essential (primary) hypertension: Secondary | ICD-10-CM

## 2022-04-10 DIAGNOSIS — E119 Type 2 diabetes mellitus without complications: Secondary | ICD-10-CM

## 2022-04-10 MED ORDER — CYANOCOBALAMIN 1000 MCG/ML IJ SOLN
1000.0000 ug | Freq: Once | INTRAMUSCULAR | Status: AC
  Administered 2022-04-10: 1000 ug via INTRAMUSCULAR

## 2022-04-10 NOTE — Assessment & Plan Note (Signed)
Lab Results  Component Value Date   HGBA1C 6.6 (H) 03/07/2022   A1C is at goal. She is currently uninsured. When she gets her new insurance next month, will see if we can get a GLP-1 covered for her to assist with sugar and weight loss.

## 2022-04-10 NOTE — Assessment & Plan Note (Signed)
B12 injection today. Continue monthly.

## 2022-04-10 NOTE — Assessment & Plan Note (Signed)
BP Readings from Last 3 Encounters:  04/10/22 135/70  03/08/22 (!) 133/90  03/07/22 (!) 165/78   Follow up BP is improved since adding diltiazem CD- will continue.

## 2022-04-12 ENCOUNTER — Telehealth: Payer: Self-pay

## 2022-04-12 NOTE — Telephone Encounter (Signed)
error 

## 2022-04-17 ENCOUNTER — Ambulatory Visit: Payer: Self-pay | Attending: Rheumatology | Admitting: Rheumatology

## 2022-04-17 ENCOUNTER — Encounter: Payer: Self-pay | Admitting: Rheumatology

## 2022-04-17 ENCOUNTER — Telehealth: Payer: Self-pay | Admitting: *Deleted

## 2022-04-17 VITALS — BP 138/84 | HR 66 | Resp 17 | Ht 61.0 in | Wt 210.4 lb

## 2022-04-17 DIAGNOSIS — Z780 Asymptomatic menopausal state: Secondary | ICD-10-CM

## 2022-04-17 DIAGNOSIS — E119 Type 2 diabetes mellitus without complications: Secondary | ICD-10-CM

## 2022-04-17 DIAGNOSIS — R002 Palpitations: Secondary | ICD-10-CM

## 2022-04-17 DIAGNOSIS — I10 Essential (primary) hypertension: Secondary | ICD-10-CM

## 2022-04-17 DIAGNOSIS — I5032 Chronic diastolic (congestive) heart failure: Secondary | ICD-10-CM

## 2022-04-17 DIAGNOSIS — M19072 Primary osteoarthritis, left ankle and foot: Secondary | ICD-10-CM

## 2022-04-17 DIAGNOSIS — M722 Plantar fascial fibromatosis: Secondary | ICD-10-CM

## 2022-04-17 DIAGNOSIS — M5441 Lumbago with sciatica, right side: Secondary | ICD-10-CM

## 2022-04-17 DIAGNOSIS — M542 Cervicalgia: Secondary | ICD-10-CM

## 2022-04-17 DIAGNOSIS — M170 Bilateral primary osteoarthritis of knee: Secondary | ICD-10-CM

## 2022-04-17 DIAGNOSIS — Z9181 History of falling: Secondary | ICD-10-CM

## 2022-04-17 DIAGNOSIS — Z8261 Family history of arthritis: Secondary | ICD-10-CM

## 2022-04-17 DIAGNOSIS — Z13820 Encounter for screening for osteoporosis: Secondary | ICD-10-CM

## 2022-04-17 DIAGNOSIS — M0579 Rheumatoid arthritis with rheumatoid factor of multiple sites without organ or systems involvement: Secondary | ICD-10-CM

## 2022-04-17 DIAGNOSIS — M3509 Sicca syndrome with other organ involvement: Secondary | ICD-10-CM

## 2022-04-17 DIAGNOSIS — I2510 Atherosclerotic heart disease of native coronary artery without angina pectoris: Secondary | ICD-10-CM

## 2022-04-17 DIAGNOSIS — Z8669 Personal history of other diseases of the nervous system and sense organs: Secondary | ICD-10-CM

## 2022-04-17 DIAGNOSIS — Z79899 Other long term (current) drug therapy: Secondary | ICD-10-CM

## 2022-04-17 DIAGNOSIS — M19071 Primary osteoarthritis, right ankle and foot: Secondary | ICD-10-CM

## 2022-04-17 DIAGNOSIS — Z9889 Other specified postprocedural states: Secondary | ICD-10-CM

## 2022-04-17 NOTE — Telephone Encounter (Signed)
Order for bone density scan placed per Dr. Corliss Skains

## 2022-04-17 NOTE — Patient Instructions (Signed)
Standing Labs We placed an order today for your standing lab work.   Please have your standing labs drawn in May and every 3 months   Please have your labs drawn 2 weeks prior to your appointment so that the provider can discuss your lab results at your appointment, if possible.  Please note that you may see your imaging and lab results in MyChart before we have reviewed them. We will contact you once all results are reviewed. Please allow our office up to 72 hours to thoroughly review all of the results before contacting the office for clarification of your results.  WALK-IN LAB HOURS  Monday through Thursday from 8:00 am -12:30 pm and 1:00 pm-5:00 pm and Friday from 8:00 am-12:00 pm.  Patients with office visits requiring labs will be seen before walk-in labs.  You may encounter longer than normal wait times. Please allow additional time. Wait times may be shorter on  Monday and Thursday afternoons.  We do not book appointments for walk-in labs. We appreciate your patience and understanding with our staff.   Labs are drawn by [facility]. Please bring your co-pay at the time of your lab draw.  You may receive a bill from [facility] for your lab work.  Please note if you are on Hydroxychloroquine and and an order has been placed for a Hydroxychloroquine level,  you will need to have it drawn 4 hours or more after your last dose.  If you wish to have your labs drawn at another location, please call the office 24 hours in advance so we can fax the orders.  The office is located at [address], [address], [place], [place] [address]   If you have any questions regarding directions or hours of operation,  please call [phone].   As a reminder, please drink plenty of water prior to coming for your lab work. Thanks!  Vaccines You are taking a medication(s) that can suppress your immune system.  The following immunizations are recommended: Flu annually Covid-19  Td/Tdap (tetanus, diphtheria,  pertussis) every 10 years Pneumonia (Prevnar 15 then Pneumovax 23 at least 1 year apart.  Alternatively, can take Prevnar 20 without needing additional dose) Shingrix: 2 doses from 4 weeks to 6 months apart  Please check with your PCP to make sure you are up to date.  If you have signs or symptoms of an infection or start antibiotics: First, call your PCP for workup of your infection. Hold your medication through the infection, until you complete your antibiotics, and until symptoms resolve if you take the following: Injectable medication (Actemra, Benlysta, Cimzia, Cosentyx, Enbrel, Humira, Kevzara, Orencia, Remicade, Simponi, Stelara, Taltz, Tremfya) Methotrexate Leflunomide (Arava) Mycophenolate (Cellcept) Xeljanz, Olumiant, or Rinvoq   

## 2022-04-29 ENCOUNTER — Encounter: Payer: Self-pay | Admitting: Oncology

## 2022-05-01 ENCOUNTER — Ambulatory Visit: Payer: Medicaid Other | Admitting: Family

## 2022-05-01 ENCOUNTER — Ambulatory Visit (INDEPENDENT_AMBULATORY_CARE_PROVIDER_SITE_OTHER): Payer: Medicaid Other | Admitting: Family

## 2022-05-01 VITALS — BP 143/72 | HR 78 | Temp 98.2°F | Resp 16 | Wt 214.0 lb

## 2022-05-01 DIAGNOSIS — Z7185 Encounter for immunization safety counseling: Secondary | ICD-10-CM | POA: Diagnosis not present

## 2022-05-01 DIAGNOSIS — Z0184 Encounter for antibody response examination: Secondary | ICD-10-CM

## 2022-05-01 DIAGNOSIS — Z23 Encounter for immunization: Secondary | ICD-10-CM | POA: Insufficient documentation

## 2022-05-01 DIAGNOSIS — I10 Essential (primary) hypertension: Secondary | ICD-10-CM

## 2022-05-01 MED ORDER — TYPHOID VACCINE PO CPDR: DELAYED_RELEASE_CAPSULE | ORAL | 0 refills | Status: DC

## 2022-05-01 MED ORDER — ROSUVASTATIN CALCIUM 10 MG PO TABS: 10.0000 mg | ORAL_TABLET | Freq: Every day | ORAL | 0 refills | Status: DC

## 2022-05-01 MED ORDER — VALSARTAN 320 MG PO TABS: 320.0000 mg | ORAL_TABLET | Freq: Every day | ORAL | 0 refills | Status: DC

## 2022-05-01 MED ORDER — CARVEDILOL 25 MG PO TABS: 25.0000 mg | ORAL_TABLET | Freq: Two times a day (BID) | ORAL | 0 refills | Status: DC

## 2022-05-01 MED ORDER — HYDROCHLOROTHIAZIDE 25 MG PO TABS: 25.0000 mg | ORAL_TABLET | Freq: Every day | ORAL | 0 refills | Status: DC

## 2022-05-01 MED ORDER — ATOVAQUONE-PROGUANIL HCL 250-100 MG PO TABS: 1.0000 | ORAL_TABLET | Freq: Every day | ORAL | 0 refills | Status: DC

## 2022-05-01 MED ORDER — SUMATRIPTAN SUCCINATE 100 MG PO TABS: 100.0000 mg | ORAL_TABLET | ORAL | 5 refills | Status: DC | PRN

## 2022-05-01 NOTE — Progress Notes (Signed)
Subjective:   By signing my name below, I, Amber [name], attest that this documentation has been prepared under the direction and in the presence of Sandford Craze, NP.  05/01/2022.   Patient ID: Amber [name], female    DOB: 1960/04/30, 62 y.o.   MRN: [id number]  Chief Complaint  Patient presents with   Hypertension    Here for follow up, needs refills before traveling   Diabetes    Here for follow up    HPI Patient is in today for an office visit. She will be traveling abroad back home to [place], and requests refills and updated immunizations. She leaves on 05/15/2022, and will stay for about 3 weeks.  Refills:  She is requesting refills for her medications. Her regimen includes Orencia administered every 10 days. Her last dose was last Tuesday, 4/23. She notes that she will be able to hold the Orencia as needed. We discussed that she should hold her Orencia given contraindication with the typhoid capsules.  Immunizations:  We reviewed the immunizations that she will need, including for MMR, malaria, typhoid, polio, and yellow fever. She reports that she is UTD with the Covid-19 booster. She completed the Shingrix vaccination 12/04/2017.  Hypertension:  Her blood pressure is elevated in clinic today to 151/84. BP Readings from Last 3 Encounters:  05/01/22 (!) 143/72  04/17/22 138/84  04/10/22 135/70    Past Medical History:  Diagnosis Date   Anemia    Arthritis    Asthma 05/03/2020   Blood transfusion without reported diagnosis    CAD (coronary artery disease) 04/24/2017   Cerebrovascular disease 10/13/2020   Chronic diastolic CHF (congestive heart failure) (HCC) 04/24/2017   Colon polyps    Controlled type 2 diabetes mellitus without complication, without long-term current use of insulin (HCC) 05/31/2017   Diabetic peripheral neuropathy (HCC) 01/05/2020   Ectopic pregnancy    RIGHT salpingostomy   Elective abortion    Elective abortion 09/26/2020   RIGHT salpingostomy    Essential hypertension 09/04/2017   GERD (gastroesophageal reflux disease)    Glaucoma    both eyes   Headache    migraines   History of chicken pox    History of chicken pox 09/26/2020   migraines   Hyperlipemia    Hypertension    Iron deficiency anemia 09/02/2013   Migraine 04/19/2014   Obesity (BMI 30-39.9) 10/17/2012   Palpitations 10/01/2012   Rheumatoid arthritis (HCC) 11/07/2011   Rheumatoid arthritis(714.0) 11/07/2011   SAB (spontaneous abortion)    SAB (spontaneous abortion) 11/07/2011   Shortness of breath    Sjogren's disease (HCC)    Wheezing     Past Surgical History:  Procedure Laterality Date   ABDOMINAL SURGERY     bowel repair   COLONOSCOPY     DILATION AND CURETTAGE OF UTERUS     X 2   MENISCUS REPAIR Right    MENISCUS TEAR REPAIR   MYOMECTOMY N/A 07/27/2014   Procedure: ABDOMINAL MYOMECTOMY;  Surgeon: Hoover Browns, MD;  Location: [facility];  Service: Gynecology;  Laterality: N/A;   PELVIC LAPAROSCOPY  1994   IN 1999 LAPAROSCOPY WITH INCIDENTAL ENTEROTOMY REQUIRING LAPAROTOMY   ROTATOR CUFF REPAIR Left 06/21/2021   UTERINE FIBROID SURGERY     July 2016    Family History  Problem Relation Age of Onset   Hypertension Mother        died from heart disease   Heart disease Mother        deceased, unknown  cause   Stroke Mother    Obesity Mother    Diabetes Sister    Hypertension Sister    Esophageal cancer Sister    Stomach cancer Sister    Cancer Sister    Diabetes Sister    Rectal cancer Neg Hx    Colon cancer Neg Hx     Social History   Socioeconomic History   Marital status: Single    Spouse name: Not on file   Number of children: 0   Years of education: college   Highest education level: Associate degree: occupational, Scientist, product/process development, or vocational program  Occupational History   Occupation: LPN    Employer: [employer]  Tobacco Use   Smoking status: Never    Passive exposure: Never   Smokeless tobacco: Never  Vaping Use   Vaping Use: Never  used  Substance and Sexual Activity   Alcohol use: No    Alcohol/week: 0.0 standard drinks of alcohol   Drug use: No   Sexual activity: Yes    Birth control/protection: None    Comment: 1st intercourse- 17, partners- 5  Other Topics Concern   Not on file  Social History Narrative   Regular exercise:  No   Caffeine Use: 2 cups coffee daily   No children   "Happily divorced"   Reports that she is involved at her church   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  Had a sister up in [place] who passed away 2015/05/20 who she was close to.   Born in [place]- she came to [place] as adult.  Age 52.    Left-handed.         Social Determinants of Health   Financial Resource Strain: Not on file  Food Insecurity: Food Insecurity Present (04/09/2022)   Hunger Vital Sign    Worried About Running Out of Food in the Last Year: Sometimes true    Ran Out of Food in the Last Year: Sometimes true  Transportation Needs: No Transportation Needs (04/09/2022)   PRAPARE - Administrator, Civil Service (Medical): No    Lack of Transportation (Non-Medical): No  Physical Activity: Sufficiently Active (04/09/2022)   Exercise Vital Sign    Days of Exercise per Week: 2 days    Minutes of Exercise per Session: 120 min  Stress: Stress Concern Present (04/09/2022)   [employer] of Occupational Health - Occupational Stress Questionnaire    Feeling of Stress : To some extent  Social Connections: Unknown (04/09/2022)   Social Connection and Isolation Panel [NHANES]    Frequency of Communication with Friends and Family: More than three times a week    Frequency of Social Gatherings with Friends and Family: Once a week    Attends Religious Services: More than 4 times per year    Active Member of [employer] or Organizations: Not on file    Attends Banker Meetings: Not on file    Marital Status: Divorced  Intimate Partner Violence: Not on file    Outpatient Medications Prior to Visit  Medication  Sig Dispense Refill   Abatacept (ORENCIA CLICKJECT) 125 MG/ML SOAJ Inject 125mg  into the skin every 14 days. (Patient taking differently: Inject 125mg  into the skin every 10 days.) 6 mL 0   albuterol (VENTOLIN HFA) 108 (90 Base) MCG/ACT inhaler Inhale 2 puffs into the lungs every 6 (six) hours as needed. 18 g 0   aspirin EC 81 MG tablet Take 1 tablet (81 mg total) by mouth daily.  90 tablet 3   benzonatate (TESSALON) 100 MG capsule Take 1 capsule (100 mg total) by mouth every 8 (eight) hours as needed for cough. 21 capsule 0   calcium-vitamin D (OSCAL WITH D) 500-200 MG-UNIT TABS tablet Take by mouth.     Cyanocobalamin (VITAMIN B-12) 5000 MCG LOZG Take 1 tablet by mouth 2 (two) times daily.     cyclobenzaprine (FLEXERIL) 5 MG tablet Take 0.5 tablets (2.5 mg total) by mouth as needed for muscle spasms. 30 tablet 5   diltiazem (CARDIZEM CD) 120 MG 24 hr capsule Take 1 capsule (120 mg total) by mouth daily. 30 capsule 5   fluticasone (FLONASE) 50 MCG/ACT nasal spray Place 2 sprays into both nostrils daily. (Patient taking differently: Place 2 sprays into both nostrils as needed.) 16 g 1   Galcanezumab-gnlm (EMGALITY) 120 MG/ML SOAJ Inject 120 mg into the skin every 30 (thirty) days. 1.12 mL 11   Iron, Ferrous Sulfate, 325 (65 Fe) MG TABS Take 325 mg by mouth 2 (two) times daily. 30 tablet    nystatin cream (MYCOSTATIN) Apply 1 Application topically 2 (two) times daily. 30 g 0   promethazine-dextromethorphan (PROMETHAZINE-DM) 6.25-15 MG/5ML syrup Take 2.5-5 mLs by mouth 3 (three) times daily as needed for cough. 240 mL 0   TRAVATAN Z 0.004 % SOLN ophthalmic solution Place 1 drop into both eyes at bedtime.      carvedilol (COREG) 25 MG tablet Take 1 tablet (25 mg total) by mouth 2 (two) times daily with a meal. 180 tablet 1   hydrochlorothiazide (HYDRODIURIL) 25 MG tablet Take 1 tablet (25 mg total) by mouth daily. 90 tablet 0   rosuvastatin (CRESTOR) 10 MG tablet Take 1 tablet (10 mg total) by mouth  daily. 90 tablet 1   SUMAtriptan (IMITREX) 100 MG tablet Take 1 tablet (100 mg total) by mouth as needed for migraine. May repeat in 2 hours if headache persists or recurs. 10 tablet 11   valsartan (DIOVAN) 320 MG tablet Take 1 tablet (320 mg total) by mouth daily. 90 tablet 1   No facility-administered medications prior to visit.    Allergies  Allergen Reactions   Gabapentin     Made her "crazy"    ROS  See HPI.     Objective:    Physical Exam Constitutional:      Appearance: Normal appearance.  HENT:     Head: Normocephalic and atraumatic.     Right Ear: Tympanic membrane, ear canal and external ear normal.     Left Ear: Tympanic membrane, ear canal and external ear normal.  Eyes:     Extraocular Movements: Extraocular movements intact.     Pupils: Pupils are equal, round, and reactive to light.  Cardiovascular:     Rate and Rhythm: Normal rate and regular rhythm.     Heart sounds: Normal heart sounds. No murmur heard.    No gallop.  Pulmonary:     Effort: Pulmonary effort is normal. No respiratory distress.     Breath sounds: Normal breath sounds. No wheezing or rales.  Skin:    General: Skin is warm and dry.  Neurological:     General: No focal deficit present.     Mental Status: She is alert and oriented to person, place, and time.  Psychiatric:        Mood and Affect: Mood normal.        Behavior: Behavior normal.     BP (!) 143/72 (BP Location: Right Arm, Patient Position:  Sitting, Cuff Size: Large)   Pulse 78   Temp 98.2 F (36.8 C) (Oral)   Resp 16   Wt 214 lb (97.1 kg)   LMP 08/09/2016   SpO2 98%   BMI 40.43 kg/m  Wt Readings from Last 3 Encounters:  05/01/22 214 lb (97.1 kg)  04/17/22 210 lb 6.4 oz (95.4 kg)  04/10/22 214 lb (97.1 kg)      Assessment & Plan:   Problem List Items Addressed This Visit       Unprioritized   Need for immunization against poliomyelitis    Polio booster given today.       Essential hypertension    Initial  bp quite elevated. Follow up bp was only slightly elevated.  BP Readings from Last 3 Encounters:  05/01/22 (!) 143/72  04/17/22 138/84  04/10/22 135/70  Was WNL 2 weeks ago. No changes to bp meds at this time. Monitor.      Relevant Medications   valsartan (DIOVAN) 320 MG tablet   rosuvastatin (CRESTOR) 10 MG tablet   hydrochlorothiazide (HYDRODIURIL) 25 MG tablet   carvedilol (COREG) 25 MG tablet   Encounter for counseling regarding immunization - Primary    Will check immunity as below. If low plan to bring her back to booster.   Rx provided for Vivotif. She is holding her Orencia at this time.  Last dose was 1 week ago and she will plan to dose vivotif next week.   Malarone to begin 2 days before trip and continue daily until 7 days after returning.       30 minutes spent on today's visit. Time was spent reviewing travel immunization guidelines and counseling patient on testing/immunizations.    Meds ordered this encounter  Medications   atovaquone-proguanil (MALARONE) 250-100 MG TABS tablet    Sig: Take 1 tablet by mouth daily.    Dispense:  30 tablet    Refill:  0    Order Specific Question:   Supervising Provider    Answer:   Danise Edge A [[id number]]   typhoid (VIVOTIF) DR capsule    Sig: One capsule on alternate days (day 1, 3, 5, and 7) for a total of 4 doses; all doses should be complete at least 1 week prior to potential exposure.    Dispense:  4 capsule    Refill:  0    Order Specific Question:   Supervising Provider    Answer:   Danise Edge A [[id number]]   valsartan (DIOVAN) 320 MG tablet    Sig: Take 1 tablet (320 mg total) by mouth daily.    Dispense:  90 tablet    Refill:  0    Order Specific Question:   Supervising Provider    Answer:   Danise Edge A [[id number]]   SUMAtriptan (IMITREX) 100 MG tablet    Sig: Take 1 tablet (100 mg total) by mouth as needed for migraine. May repeat in 2 hours if headache persists or recurs.    Dispense:  30 tablet    Refill:  5     Order Specific Question:   Supervising Provider    Answer:   Danise Edge A [[id number]]   rosuvastatin (CRESTOR) 10 MG tablet    Sig: Take 1 tablet (10 mg total) by mouth daily.    Dispense:  90 tablet    Refill:  0    Order Specific Question:   Supervising Provider    Answer:   Danise Edge A [[id number]]   hydrochlorothiazide (  HYDRODIURIL) 25 MG tablet    Sig: Take 1 tablet (25 mg total) by mouth daily.    Dispense:  90 tablet    Refill:  0    Order Specific Question:   Supervising Provider    Answer:   Danise Edge A [[id number]]   carvedilol (COREG) 25 MG tablet    Sig: Take 1 tablet (25 mg total) by mouth 2 (two) times daily with a meal.    Dispense:  180 tablet    Refill:  0    Order Specific Question:   Supervising Provider    Answer:   Danise Edge A [[id number]]    I, Lemont Fillers, NP, personally preformed the services described in this documentation.  All medical record entries made by the scribe were at my direction and in my presence.  I have reviewed the chart and discharge instructions (if applicable) and agree that the record reflects my personal performance and is accurate and complete. 05/01/2022.  I,Mathew Stumpf,acting as a Neurosurgeon for [employer], NP.,have documented all relevant documentation on the behalf of Lemont Fillers, NP,as directed by  Lemont Fillers, NP while in the presence of Lemont Fillers, NP.   Lemont Fillers, NP

## 2022-05-01 NOTE — Assessment & Plan Note (Signed)
Polio booster given today.

## 2022-05-01 NOTE — Assessment & Plan Note (Signed)
Initial bp quite elevated. Follow up bp was only slightly elevated.  BP Readings from Last 3 Encounters:  05/01/22 (!) 143/72  04/17/22 138/84  04/10/22 135/70   Was WNL 2 weeks ago. No changes to bp meds at this time. Monitor.

## 2022-05-01 NOTE — Assessment & Plan Note (Addendum)
Will check immunity as below. If low plan to bring her back to booster.   Rx provided for Vivotif. She is holding her Orencia at this time.  Last dose was 1 week ago and she will plan to dose vivotif next week.   Malarone to begin 2 days before trip and continue daily until 7 days after returning.

## 2022-05-02 ENCOUNTER — Telehealth: Payer: Self-pay | Admitting: Family

## 2022-05-02 LAB — MEASLES/MUMPS/RUBELLA IMMUNITY
Mumps IgG: >300 AU/mL
Rubella: 25.5 Index
Rubeola IgG: >300 AU/mL

## 2022-05-02 LAB — HEPATITIS B SURFACE ANTIBODY, QUANTITATIVE: Hep B S AB Quant (Post): 101 m[IU]/mL (ref >=10)

## 2022-05-02 NOTE — Telephone Encounter (Signed)
-----   Message from Pollyann Savoy, MD sent at 05/02/2022  1:01 PM EDT ----- Dub Amis is in the system for about 1 month after stopping the medication.  I would not recommend any live vaccines for at least 2 months after stopping Biologics. Thanks, Janalyn Rouse ----- Message ----- From: Sandford Craze, NP Sent: 05/01/2022  11:48 AM EDT To: Pollyann Savoy, MD  Dear Dr. Corliss Skains,  Ms. Cairns will be travelling to [place] in about 2 weeks. She should take the Vivotif oral vaccine 1 week prior to leaving. She tells me her last dose of orencia was about 7 days ago and she plans to hold the next dose.  Are you comfortable with her taking the Vivotif next week as it is a live vaccine?   Just wanted to be sure as I don't work with Dub Amis very often.  Thanks,  [employer]

## 2022-05-02 NOTE — Telephone Encounter (Signed)
Please advise pt not to take the vivotif tablets as they ar a live vaccine and Dr. Corliss Skains says that the orencia will stay in her system for 1 month after stoppin.

## 2022-05-03 NOTE — Telephone Encounter (Signed)
Patient notified of information on this message and she verbalized understanding

## 2022-07-13 DIAGNOSIS — E119 Type 2 diabetes mellitus without complications: Secondary | ICD-10-CM | POA: Diagnosis not present

## 2022-07-13 LAB — HM DIABETES EYE EXAM

## 2022-07-16 ENCOUNTER — Ambulatory Visit: Payer: Medicaid Other | Admitting: Family

## 2022-07-17 ENCOUNTER — Other Ambulatory Visit (HOSPITAL_BASED_OUTPATIENT_CLINIC_OR_DEPARTMENT_OTHER): Payer: Self-pay | Admitting: Family

## 2022-07-17 ENCOUNTER — Encounter: Payer: Self-pay | Admitting: Family

## 2022-07-17 ENCOUNTER — Ambulatory Visit (INDEPENDENT_AMBULATORY_CARE_PROVIDER_SITE_OTHER): Payer: Medicaid Other | Admitting: Family

## 2022-07-17 VITALS — BP 145/85 | HR 83 | Temp 98.5°F | Resp 16 | Ht 61.0 in | Wt 215.0 lb

## 2022-07-17 DIAGNOSIS — Z1231 Encounter for screening mammogram for malignant neoplasm of breast: Secondary | ICD-10-CM

## 2022-07-17 DIAGNOSIS — G6289 Other specified polyneuropathies: Secondary | ICD-10-CM

## 2022-07-17 DIAGNOSIS — D509 Iron deficiency anemia, unspecified: Secondary | ICD-10-CM

## 2022-07-17 DIAGNOSIS — E785 Hyperlipidemia, unspecified: Secondary | ICD-10-CM

## 2022-07-17 DIAGNOSIS — Z23 Encounter for immunization: Secondary | ICD-10-CM | POA: Diagnosis not present

## 2022-07-17 DIAGNOSIS — I10 Essential (primary) hypertension: Secondary | ICD-10-CM

## 2022-07-17 DIAGNOSIS — E538 Deficiency of other specified B group vitamins: Secondary | ICD-10-CM | POA: Diagnosis not present

## 2022-07-17 DIAGNOSIS — Z0000 Encounter for general adult medical examination without abnormal findings: Secondary | ICD-10-CM

## 2022-07-17 DIAGNOSIS — E119 Type 2 diabetes mellitus without complications: Secondary | ICD-10-CM | POA: Diagnosis not present

## 2022-07-17 DIAGNOSIS — G43009 Migraine without aura, not intractable, without status migrainosus: Secondary | ICD-10-CM

## 2022-07-17 DIAGNOSIS — M0579 Rheumatoid arthritis with rheumatoid factor of multiple sites without organ or systems involvement: Secondary | ICD-10-CM

## 2022-07-17 DIAGNOSIS — G629 Polyneuropathy, unspecified: Secondary | ICD-10-CM | POA: Insufficient documentation

## 2022-07-17 DIAGNOSIS — Z1211 Encounter for screening for malignant neoplasm of colon: Secondary | ICD-10-CM

## 2022-07-17 LAB — CBC WITH DIFFERENTIAL/PLATELET
Basophils Absolute: 0 10*3/uL (ref 0.0–0.1)
Basophils Relative: 0.8 % (ref 0.0–3.0)
Eosinophils Absolute: 0.1 10*3/uL (ref 0.0–0.7)
Eosinophils Relative: 3.4 % (ref 0.0–5.0)
HCT: 41 % (ref 36.0–46.0)
Hemoglobin: 13.3 g/dL (ref 12.0–15.0)
Lymphocytes Relative: 51.8 % — ABNORMAL HIGH (ref 12.0–46.0)
Lymphs Abs: 1.6 10*3/uL (ref 0.7–4.0)
MCHC: 32.5 g/dL (ref 30.0–36.0)
MCV: 86 fl (ref 78.0–100.0)
Monocytes Absolute: 0.3 10*3/uL (ref 0.1–1.0)
Monocytes Relative: 9 % (ref 3.0–12.0)
Neutro Abs: 1.1 10*3/uL — ABNORMAL LOW (ref 1.4–7.7)
Neutrophils Relative %: 35 % — ABNORMAL LOW (ref 43.0–77.0)
Platelets: 223 10*3/uL (ref 150.0–400.0)
RBC: 4.77 Mil/uL (ref 3.87–5.11)
RDW: 13.8 % (ref 11.5–15.5)
WBC: 3.2 10*3/uL — ABNORMAL LOW (ref 4.0–10.5)

## 2022-07-17 LAB — COMPREHENSIVE METABOLIC PANEL
ALT: 18 U/L (ref 0–35)
AST: 20 U/L (ref 0–37)
Albumin: 4.1 g/dL (ref 3.5–5.2)
Alkaline Phosphatase: 50 U/L (ref 39–117)
BUN: 12 mg/dL (ref 6–23)
CO2: 28 mEq/L (ref 19–32)
Calcium: 10 mg/dL (ref 8.4–10.5)
Chloride: 103 mEq/L (ref 96–112)
Creatinine, Ser: 0.7 mg/dL (ref 0.40–1.20)
GFR: 93.59 mL/min (ref >60.00)
Glucose, Bld: 114 mg/dL — ABNORMAL HIGH (ref 70–99)
Potassium: 3.8 mEq/L (ref 3.5–5.1)
Sodium: 137 mEq/L (ref 135–145)
Total Bilirubin: 0.8 mg/dL (ref 0.2–1.2)
Total Protein: 8.5 g/dL — ABNORMAL HIGH (ref 6.0–8.3)

## 2022-07-17 LAB — LIPID PANEL
Cholesterol: 126 mg/dL (ref 0–200)
HDL: 49.3 mg/dL (ref >39.00)
LDL Cholesterol: 57 mg/dL (ref 0–99)
NonHDL: 76.95
Total CHOL/HDL Ratio: 3
Triglycerides: 98 mg/dL (ref 0.0–149.0)
VLDL: 19.6 mg/dL (ref 0.0–40.0)

## 2022-07-17 LAB — HEMOGLOBIN A1C: Hgb A1c MFr Bld: 6.7 % — ABNORMAL HIGH (ref 4.6–6.5)

## 2022-07-17 MED ORDER — CYANOCOBALAMIN 1000 MCG/ML IJ SOLN
1000.0000 ug | Freq: Once | INTRAMUSCULAR | Status: AC
Start: 2022-07-17 — End: 2022-07-17
  Administered 2022-07-17: 1000 ug via INTRAMUSCULAR

## 2022-07-17 MED ORDER — HYDROCHLOROTHIAZIDE 25 MG PO TABS
25.0000 mg | ORAL_TABLET | Freq: Every day | ORAL | 0 refills | Status: DC
Start: 2022-07-17 — End: 2022-08-23

## 2022-07-17 MED ORDER — ROSUVASTATIN CALCIUM 10 MG PO TABS
10.0000 mg | ORAL_TABLET | Freq: Every day | ORAL | 0 refills | Status: DC
Start: 2022-07-17 — End: 2022-08-24

## 2022-07-17 MED ORDER — CYANOCOBALAMIN 1000 MCG/ML IJ SOLN
1000.0000 ug | Freq: Once | INTRAMUSCULAR | 0 refills | Status: AC
Start: 2022-07-17 — End: 2022-07-17

## 2022-07-17 MED ORDER — DILTIAZEM HCL ER COATED BEADS 240 MG PO CP24
240.0000 mg | ORAL_CAPSULE | Freq: Every day | ORAL | 1 refills | Status: DC
Start: 2022-07-17 — End: 2023-08-06

## 2022-07-17 MED ORDER — VALSARTAN 320 MG PO TABS
320.0000 mg | ORAL_TABLET | Freq: Every day | ORAL | 0 refills | Status: DC
Start: 2022-07-17 — End: 2022-10-17

## 2022-07-17 MED ORDER — SUMATRIPTAN SUCCINATE 100 MG PO TABS
100.0000 mg | ORAL_TABLET | ORAL | 5 refills | Status: AC | PRN
Start: 2022-07-17 — End: ?

## 2022-07-17 MED ORDER — CARVEDILOL 25 MG PO TABS
25.0000 mg | ORAL_TABLET | Freq: Two times a day (BID) | ORAL | 0 refills | Status: DC
Start: 2022-07-17 — End: 2022-10-17

## 2022-07-17 NOTE — Assessment & Plan Note (Signed)
Migraine: Reports frequent headaches, needs Imitrex refill. -Refill Imitrex prescription.

## 2022-07-17 NOTE — Progress Notes (Signed)
Subjective:     Patient ID: Amber [name], female    DOB: 14-Dec-1960, 62 y.o.   MRN: [id number]  Chief Complaint  Patient presents with   Annual Exam    HPI  Discussed the use of AI scribe software for clinical note transcription with the patient, who gave verbal consent to proceed.  History of Present Illness     The patient presents for a general health check-up and reports a burning sensation in both feet, suggestive of neuropathy. She has previously tried gabapentin for this issue but experienced adverse effects, including sleepiness. She is hesitant to retry the medication, even at a lower dose.  The patient also reports difficulty maintaining a healthy diet and has been unable to engage in regular exercise. She was referred to a diabetes specialist nearly two years ago but has not yet received a call for an appointment. Despite this, her A1c levels have remained stable.  The patient has been unable to refill her Orencia prescription due to losing her job and subsequently her insurance. She reports increased pain, likely due to the lack of Orencia. She is currently seeking new employment but is struggling to find a position that does not require night shifts or long hours.  The patient has not received a call for her colonoscopy, which was due last year. She was referred to Dr. Loreta Ave, but his office reportedly does not accept Medicaid. The patient is open to being referred to a different group.  The patient is up to date on her eye exams and dental check-ups. She is due for a mammogram in November and can schedule this on her way out. She has not had a B12 shot in nearly two months and can receive one today.  The patient reports occasional coughing, which she attributes to allergies. She also reports swelling in her legs, which she believes is due to the lack of Orencia. She has frequent headaches and needs a refill on her Imitrex prescription.     Patient presents today for  complete physical.  Immunizations: tetanus is due Diet: needs improvement Exercise: very little Wt Readings from Last 3 Encounters:  07/17/22 215 lb (97.5 kg)  05/01/22 214 lb (97.1 kg)  04/17/22 210 lb 6.4 oz (95.4 kg)  Colonoscopy: due Pap Smear: due 10/25 Mammogram: due 11/24 Vision: 2024 Dental: up to date    Health Maintenance Due  Topic Date Due   COVID-19 Vaccine (5 - 2023-24 season) 09/01/2021   Colonoscopy  12/06/2021    Past Medical History:  Diagnosis Date   Anemia    Arthritis    Asthma 05/03/2020   Blood transfusion without reported diagnosis    CAD (coronary artery disease) 04/24/2017   Cerebrovascular disease 10/13/2020   Chronic diastolic CHF (congestive heart failure) (HCC) 04/24/2017   Colon polyps    Controlled type 2 diabetes mellitus without complication, without long-term current use of insulin (HCC) 05/31/2017   Diabetic peripheral neuropathy (HCC) 01/05/2020   Ectopic pregnancy    RIGHT salpingostomy   Elective abortion    Elective abortion 09/26/2020   RIGHT salpingostomy   Essential hypertension 09/04/2017   GERD (gastroesophageal reflux disease)    Glaucoma    both eyes   Headache    migraines   History of chicken pox    History of chicken pox 09/26/2020   migraines   Hyperlipemia    Hypertension    Iron deficiency anemia 09/02/2013   Migraine 04/19/2014   Obesity (BMI 30-39.9) 10/17/2012  Palpitations 10/01/2012   Rheumatoid arthritis (HCC) 11/07/2011   Rheumatoid arthritis(714.0) 11/07/2011   SAB (spontaneous abortion)    SAB (spontaneous abortion) 11/07/2011   Shortness of breath    Sjogren's disease (HCC)    Wheezing     Past Surgical History:  Procedure Laterality Date   ABDOMINAL SURGERY     bowel repair   COLONOSCOPY     DILATION AND CURETTAGE OF UTERUS     X 2   MENISCUS REPAIR Right    MENISCUS TEAR REPAIR   MYOMECTOMY N/A 07/27/2014   Procedure: ABDOMINAL MYOMECTOMY;  Surgeon: Hoover Browns, MD;  Location: [facility];  Service:  Gynecology;  Laterality: N/A;   PELVIC LAPAROSCOPY  1994   IN 1999 LAPAROSCOPY WITH INCIDENTAL ENTEROTOMY REQUIRING LAPAROTOMY   ROTATOR CUFF REPAIR Left 06/21/2021   UTERINE FIBROID SURGERY     Jul 29, 2014   Family History  Problem Relation Age of Onset   Hypertension Mother        died from heart disease   Heart disease Mother        deceased, unknown cause   Stroke Mother    Obesity Mother    Diabetes Sister    Hypertension Sister    Esophageal cancer Sister    Stomach cancer Sister    Cancer Sister    Diabetes Sister    Esophageal cancer Sister        and gastric cancer   Parkinson's disease Brother    Rectal cancer Neg Hx    Colon cancer Neg Hx     Social History   Socioeconomic History   Marital status: Single    Spouse name: Not on file   Number of children: 0   Years of education: college   Highest education level: Associate degree: occupational, Scientist, product/process development, or vocational program  Occupational History   Occupation: LPN    Employer: [employer]  Tobacco Use   Smoking status: Never    Passive exposure: Never   Smokeless tobacco: Never  Vaping Use   Vaping status: Never Used  Substance and Sexual Activity   Alcohol use: No    Alcohol/week: 0.0 standard drinks of alcohol   Drug use: No   Sexual activity: Yes    Birth control/protection: None    Comment: 1st intercourse- 17, partners- 5  Other Topics Concern   Not on file  Social History Narrative   Regular exercise:  No   Caffeine Use: 2 cups coffee daily   No children   "Happily divorced"   Reports that she is involved at her church   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  Had a sister up in [place] who passed away 2015-07-29 who she was close to.   Born in [place]- she came to [place] as adult.  Age 55.    Left-handed.         Social Determinants of Health   Financial Resource Strain: Not on file  Food Insecurity: Food Insecurity Present (04/09/2022)   Hunger Vital Sign    Worried About Running Out  of Food in the Last Year: Sometimes true    Ran Out of Food in the Last Year: Sometimes true  Transportation Needs: No Transportation Needs (04/09/2022)   PRAPARE - Administrator, Civil Service (Medical): No    Lack of Transportation (Non-Medical): No  Physical Activity: Sufficiently Active (04/09/2022)   Exercise Vital Sign    Days of Exercise per Week: 2 days  Minutes of Exercise per Session: 120 min  Stress: Stress Concern Present (04/09/2022)   [employer] of Occupational Health - Occupational Stress Questionnaire    Feeling of Stress : To some extent  Social Connections: Unknown (04/09/2022)   Social Connection and Isolation Panel [NHANES]    Frequency of Communication with Friends and Family: More than three times a week    Frequency of Social Gatherings with Friends and Family: Once a week    Attends Religious Services: More than 4 times per year    Active Member of [employer] or Organizations: Not on file    Attends Banker Meetings: Not on file    Marital Status: Divorced  Intimate Partner Violence: Unknown (04/04/2021)   Received from [employer], [facility]   HITS    Physically Hurt: Not on file    Insult or Talk Down To: Not on file    Threaten Physical Harm: Not on file    Scream or Curse: Not on file    Outpatient Medications Prior to Visit  Medication Sig Dispense Refill   Abatacept (ORENCIA CLICKJECT) 125 MG/ML SOAJ Inject 125mg  into the skin every 14 days. (Patient taking differently: Inject 125mg  into the skin every 10 days.) 6 mL 0   albuterol (VENTOLIN HFA) 108 (90 Base) MCG/ACT inhaler Inhale 2 puffs into the lungs every 6 (six) hours as needed. 18 g 0   aspirin EC 81 MG tablet Take 1 tablet (81 mg total) by mouth daily. 90 tablet 3   calcium-vitamin D (OSCAL WITH D) 500-200 MG-UNIT TABS tablet Take by mouth.     Cyanocobalamin (VITAMIN B-12) 5000 MCG LOZG Take 1 tablet by mouth 2 (two) times daily.     cyclobenzaprine (FLEXERIL) 5 MG  tablet Take 0.5 tablets (2.5 mg total) by mouth as needed for muscle spasms. 30 tablet 5   fluticasone (FLONASE) 50 MCG/ACT nasal spray Place 2 sprays into both nostrils daily. (Patient taking differently: Place 2 sprays into both nostrils as needed.) 16 g 1   Iron, Ferrous Sulfate, 325 (65 Fe) MG TABS Take 325 mg by mouth 2 (two) times daily. 30 tablet    TRAVATAN Z 0.004 % SOLN ophthalmic solution Place 1 drop into both eyes at bedtime.      atovaquone-proguanil (MALARONE) 250-100 MG TABS tablet Take 1 tablet by mouth daily. 30 tablet 0   benzonatate (TESSALON) 100 MG capsule Take 1 capsule (100 mg total) by mouth every 8 (eight) hours as needed for cough. 21 capsule 0   carvedilol (COREG) 25 MG tablet Take 1 tablet (25 mg total) by mouth 2 (two) times daily with a meal. 180 tablet 0   diltiazem (CARDIZEM CD) 120 MG 24 hr capsule Take 1 capsule (120 mg total) by mouth daily. 30 capsule 5   Galcanezumab-gnlm (EMGALITY) 120 MG/ML SOAJ Inject 120 mg into the skin every 30 (thirty) days. 1.12 mL 11   hydrochlorothiazide (HYDRODIURIL) 25 MG tablet Take 1 tablet (25 mg total) by mouth daily. 90 tablet 0   nystatin cream (MYCOSTATIN) Apply 1 Application topically 2 (two) times daily. 30 g 0   promethazine-dextromethorphan (PROMETHAZINE-DM) 6.25-15 MG/5ML syrup Take 2.5-5 mLs by mouth 3 (three) times daily as needed for cough. 240 mL 0   rosuvastatin (CRESTOR) 10 MG tablet Take 1 tablet (10 mg total) by mouth daily. 90 tablet 0   SUMAtriptan (IMITREX) 100 MG tablet Take 1 tablet (100 mg total) by mouth as needed for migraine. May repeat in 2 hours  if headache persists or recurs. 30 tablet 5   valsartan (DIOVAN) 320 MG tablet Take 1 tablet (320 mg total) by mouth daily. 90 tablet 0   No facility-administered medications prior to visit.    Allergies  Allergen Reactions   Gabapentin     Made her "crazy"    Review of Systems  Constitutional:  Negative for weight loss.  HENT:  Negative for congestion  and hearing loss.   Eyes:  Negative for blurred vision.  Respiratory:  Positive for cough (mild cough- maybe from allergies).   Cardiovascular:  Positive for leg swelling (chronic LE edema- unchanged).  Gastrointestinal:  Negative for blood in stool, constipation and diarrhea.  Genitourinary:  Negative for dysuria and frequency.  Musculoskeletal:  Positive for joint pain.  Skin:  Negative for rash.  Neurological:  Positive for headaches.  Psychiatric/Behavioral:         Denies depression/anxiety   BP Readings from Last 3 Encounters:  07/17/22 (!) 145/85  05/01/22 (!) 143/72  04/17/22 138/84       Objective:    Physical Exam   BP (!) 145/85   Pulse 83   Temp 98.5 F (36.9 C) (Oral)   Resp 16   Ht 5\' 1"  (1.549 m)   Wt 215 lb (97.5 kg)   LMP 08/09/2016   SpO2 100%   BMI 40.62 kg/m  Wt Readings from Last 3 Encounters:  07/17/22 215 lb (97.5 kg)  05/01/22 214 lb (97.1 kg)  04/17/22 210 lb 6.4 oz (95.4 kg)   Physical Exam  Constitutional: She is oriented to person, place, and time. She appears well-developed and well-nourished. No distress.  HENT:  Head: Normocephalic and atraumatic.  Right Ear: Tympanic membrane and ear canal normal.  Left Ear: Tympanic membrane and ear canal normal.  Mouth/Throat: Oropharynx is clear and moist.  Eyes: Pupils are equal, round, and reactive to light. No scleral icterus.  Neck: Normal range of motion. No thyromegaly present.  Cardiovascular: Normal rate and regular rhythm.   No murmur heard. Pulmonary/Chest: Effort normal and breath sounds normal. No respiratory distress. He has no wheezes. She has no rales. She exhibits no tenderness.  Abdominal: Soft. Bowel sounds are normal. She exhibits no distension and no mass. There is no tenderness. There is no rebound and no guarding.  Musculoskeletal: She exhibits no edema.  Lymphadenopathy:    She has no cervical adenopathy.  Neurological: She is alert and oriented to person, place, and  time. She has normal patellar reflexes. She exhibits normal muscle tone. Coordination normal.  Skin: Skin is warm and dry.  Psychiatric: She has a normal mood and affect. Her behavior is normal. Judgment and thought content normal.  Breasts: Examined lying Right: Without masses, retractions, discharge or axillary adenopathy.  Left: Without masses, retractions, discharge or axillary adenopathy.  Inguinal/mons: Normal without inguinal adenopathy  External genitalia: Normal  BUS/Urethra/Skene's glands: Normal  Bladder: Normal  Vagina: Normal  Cervix: Normal  Uterus: normal in size, shape and contour. Midline and mobile  Adnexa/parametria:  Rt: Without masses or tenderness.  Lt: Without masses or tenderness.  Anus and perineum: Normal            Assessment & Plan:       Assessment & Plan:   Problem List Items Addressed This Visit       Unprioritized   Rheumatoid arthritis (HCC)    She is working on trying to get new insurance so she can resume [place].  Preventative health care - Primary    Continue work on healthy diet, exercise and weight loss.  Colon Cancer Screening: Due for colonoscopy, previous referral to Dr. Loreta Ave was unsuccessful due to insurance issues. -Attempt referral to a different group that accepts Medicaid. -Administer Tetanus and B12 injections today. -Schedule mammogram for after 11/08/2022. -Check A1c today. -Refill Dilantin and other necessary prescriptions. -Follow-up in 3 months.      Peripheral neuropathy    Reports burning sensation in both feet. Previous trial of Gabapentin caused sleepiness and patient is hesitant to retry. -Consider low dose Gabapentin trial if symptoms worsen.      Migraine   Relevant Medications   valsartan (DIOVAN) 320 MG tablet   SUMAtriptan (IMITREX) 100 MG tablet   rosuvastatin (CRESTOR) 10 MG tablet   hydrochlorothiazide (HYDRODIURIL) 25 MG tablet   carvedilol (COREG) 25 MG tablet   diltiazem (CARTIA XT)  240 MG 24 hr capsule   Iron deficiency anemia   Relevant Medications   cyanocobalamin (VITAMIN B12) 1000 MCG/ML injection   Other Relevant Orders   CBC w/Diff   Iron, TIBC and Ferritin Panel   Hyperlipidemia   Relevant Medications   valsartan (DIOVAN) 320 MG tablet   rosuvastatin (CRESTOR) 10 MG tablet   hydrochlorothiazide (HYDRODIURIL) 25 MG tablet   carvedilol (COREG) 25 MG tablet   diltiazem (CARTIA XT) 240 MG 24 hr capsule   Other Relevant Orders   Lipid panel   Essential hypertension    BP above goal. Will increase cardizem CD from 120mg  to 240mg .       Relevant Medications   valsartan (DIOVAN) 320 MG tablet   rosuvastatin (CRESTOR) 10 MG tablet   hydrochlorothiazide (HYDRODIURIL) 25 MG tablet   carvedilol (COREG) 25 MG tablet   diltiazem (CARTIA XT) 240 MG 24 hr capsule   Controlled type 2 diabetes mellitus without complication, without long-term current use of insulin (HCC)    Stable A1c, no recent follow-up with diabetes education due to insurance issues. -Continue current management.      Relevant Medications   valsartan (DIOVAN) 320 MG tablet   rosuvastatin (CRESTOR) 10 MG tablet   Other Relevant Orders   HgB A1c   Comp Met (CMET)   B12 deficiency    B12 injection today.       Relevant Medications   cyanocobalamin (VITAMIN B12) 1000 MCG/ML injection   Other Visit Diagnoses     Screening for colon cancer       Relevant Orders   Ambulatory referral to Gastroenterology   Need for Td vaccine       Relevant Orders   Td vaccine greater than or equal to 7yo preservative free IM (Completed)       I have discontinued Rhianon D. Hostetter's Emgality, nystatin cream, promethazine-dextromethorphan, diltiazem, benzonatate, and atovaquone-proguanil. I am also having her start on diltiazem and cyanocobalamin. Additionally, I am having her maintain her Travatan Z, Vitamin B-12, calcium-vitamin D, aspirin EC, cyclobenzaprine, Iron (Ferrous Sulfate), Orencia  ClickJect, fluticasone, albuterol, valsartan, SUMAtriptan, rosuvastatin, hydrochlorothiazide, and carvedilol. We administered cyanocobalamin.  Meds ordered this encounter  Medications   valsartan (DIOVAN) 320 MG tablet    Sig: Take 1 tablet (320 mg total) by mouth daily.    Dispense:  90 tablet    Refill:  0    Order Specific Question:   Supervising Provider    Answer:   Danise Edge A [[id number]]   SUMAtriptan (IMITREX) 100 MG tablet    Sig: Take 1 tablet (100 mg  total) by mouth as needed for migraine. May repeat in 2 hours if headache persists or recurs.    Dispense:  30 tablet    Refill:  5    Order Specific Question:   Supervising Provider    Answer:   Danise Edge A [[id number]]   rosuvastatin (CRESTOR) 10 MG tablet    Sig: Take 1 tablet (10 mg total) by mouth daily.    Dispense:  90 tablet    Refill:  0    Order Specific Question:   Supervising Provider    Answer:   Danise Edge A [[id number]]   hydrochlorothiazide (HYDRODIURIL) 25 MG tablet    Sig: Take 1 tablet (25 mg total) by mouth daily.    Dispense:  90 tablet    Refill:  0    Order Specific Question:   Supervising Provider    Answer:   Danise Edge A [[id number]]   carvedilol (COREG) 25 MG tablet    Sig: Take 1 tablet (25 mg total) by mouth 2 (two) times daily with a meal.    Dispense:  180 tablet    Refill:  0    Order Specific Question:   Supervising Provider    Answer:   Danise Edge A [[id number]]   diltiazem (CARTIA XT) 240 MG 24 hr capsule    Sig: Take 1 capsule (240 mg total) by mouth daily.    Dispense:  90 capsule    Refill:  1    Order Specific Question:   Supervising Provider    Answer:   Danise Edge A [[id number]]   cyanocobalamin (VITAMIN B12) 1000 MCG/ML injection    Sig: Inject 1 mL (1,000 mcg total) into the muscle once for 1 dose.    Dispense:  1 mL    Refill:  0   cyanocobalamin (VITAMIN B12) injection 1,000 mcg

## 2022-07-17 NOTE — Assessment & Plan Note (Signed)
BP above goal. Will increase cardizem CD from 120mg  to 240mg .

## 2022-07-17 NOTE — Assessment & Plan Note (Signed)
Stable A1c, no recent follow-up with diabetes education due to insurance issues. -Continue current management.

## 2022-07-17 NOTE — Assessment & Plan Note (Addendum)
Continue work on [employer], exercise and weight loss.  Colon Cancer Screening: Due for colonoscopy, previous referral to Dr. Loreta Ave was unsuccessful due to insurance issues. -Attempt referral to a different group that accepts Medicaid. -Administer Tetanus and B12 injections today. -Schedule mammogram for after 11/08/2022. -Check A1c today. -Refill Dilantin and other necessary prescriptions. -Follow-up in 3 months.

## 2022-07-17 NOTE — Assessment & Plan Note (Signed)
She is working on trying to get new insurance so she can resume [place].

## 2022-07-17 NOTE — Assessment & Plan Note (Signed)
 B12 injection today 

## 2022-07-17 NOTE — Assessment & Plan Note (Signed)
Reports burning sensation in both feet. Previous trial of Gabapentin caused sleepiness and patient is hesitant to retry. -Consider low dose Gabapentin trial if symptoms worsen.

## 2022-07-17 NOTE — Patient Instructions (Signed)
VISIT SUMMARY:  During your visit, we discussed several health concerns including a burning sensation in your feet, difficulty maintaining a healthy diet and exercise routine, issues with your insurance and job loss, and a persistent cough. We also discussed your family history of various health conditions.  YOUR PLAN:  -PERIPHERAL NEUROPATHY: This is a condition that results in a burning sensation in your feet. We discussed possibly trying a low dose of Gabapentin again if your symptoms worsen.  -DIABETES: Your blood sugar levels have been stable, and we will continue with your current management plan.  -HYPERTENSION: Your blood pressure was high during the visit, but you've reported normal readings at home. We will increase your Diltiazem dosage and recheck your blood pressure in a week.  -COLON CANCER SCREENING: You are due for a colonoscopy. We will attempt to refer you to a different group that accepts Medicaid due to previous insurance issues.  -MIGRAINES: You've been experiencing frequent headaches. We will refill your Imitrex prescription to help manage this.  -GENERAL HEALTH MAINTENANCE: We administered Tetanus and B12 injections during your visit. We will schedule a mammogram for after 11/08/2022, check your A1c levels, and refill your Dilantin and iron supplement prescriptions. Please continue taking baby aspirin, B12 oral lozenges, and over-the-counter iron supplements. We will follow up in 3 months.  INSTRUCTIONS:  Please monitor your blood pressure at home and return in a week for a recheck. Continue taking your current medications as prescribed, including the increased dose of Diltiazem. We will attempt to schedule your colonoscopy with a group that accepts Medicaid. Please return in 3 months for a follow-up visit.

## 2022-07-18 LAB — IRON,TIBC AND FERRITIN PANEL
%SAT: 29 % (calc) (ref 16–45)
Ferritin: 73 ng/mL (ref 16–288)
Iron: 87 ug/dL (ref 45–160)
TIBC: 302 mcg/dL (calc) (ref 250–450)

## 2022-07-19 DIAGNOSIS — H401131 Primary open-angle glaucoma, bilateral, mild stage: Secondary | ICD-10-CM | POA: Diagnosis not present

## 2022-07-23 DIAGNOSIS — H5213 Myopia, bilateral: Secondary | ICD-10-CM | POA: Diagnosis not present

## 2022-07-25 ENCOUNTER — Telehealth: Payer: Self-pay | Admitting: Gastroenterology

## 2022-07-25 ENCOUNTER — Encounter: Payer: Self-pay | Admitting: Gastroenterology

## 2022-07-25 NOTE — Telephone Encounter (Signed)
Error

## 2022-07-26 ENCOUNTER — Encounter: Payer: Self-pay | Admitting: *Deleted

## 2022-08-01 ENCOUNTER — Encounter (INDEPENDENT_AMBULATORY_CARE_PROVIDER_SITE_OTHER): Payer: Self-pay

## 2022-08-02 HISTORY — PX: COLONOSCOPY: SHX174

## 2022-08-09 ENCOUNTER — Encounter: Payer: Self-pay | Admitting: Oncology

## 2022-08-14 ENCOUNTER — Ambulatory Visit (AMBULATORY_SURGERY_CENTER): Payer: Medicaid Other

## 2022-08-14 VITALS — Ht 61.0 in | Wt 220.0 lb

## 2022-08-14 DIAGNOSIS — Z86010 Personal history of colonic polyps: Secondary | ICD-10-CM

## 2022-08-14 MED ORDER — NA SULFATE-K SULFATE-MG SULF 17.5-3.13-1.6 GM/177ML PO SOLN: 1.0000 | Freq: Once | ORAL | 0 refills | Status: AC

## 2022-08-14 NOTE — Progress Notes (Signed)
 No egg or soy allergy known to patient  No issues known to pt with past sedation with any surgeries or procedures Patient denies ever being told they had issues or difficulty with intubation  No FH of Malignant Hyperthermia Pt is not on diet pills Pt is not on  home 02  Pt is not on blood thinners  Pt denies issues with chronic constipation  No A fib or A flutter Have any cardiac testing pending--no Ambulates independently Pt instructed to use [email or web address] or GoodRx for a price reduction on prep

## 2022-08-17 ENCOUNTER — Ambulatory Visit: Payer: Medicaid Other

## 2022-08-21 ENCOUNTER — Encounter: Payer: Self-pay | Admitting: Oncology

## 2022-08-22 ENCOUNTER — Encounter: Payer: Self-pay | Admitting: Oncology

## 2022-08-23 ENCOUNTER — Ambulatory Visit (INDEPENDENT_AMBULATORY_CARE_PROVIDER_SITE_OTHER): Payer: Medicaid Other

## 2022-08-23 DIAGNOSIS — E538 Deficiency of other specified B group vitamins: Secondary | ICD-10-CM | POA: Diagnosis not present

## 2022-08-23 DIAGNOSIS — I10 Essential (primary) hypertension: Secondary | ICD-10-CM

## 2022-08-23 MED ORDER — TRIAMTERENE-HCTZ 37.5-25 MG PO TABS: 1.0000 | ORAL_TABLET | Freq: Every day | ORAL | 1 refills | Status: DC

## 2022-08-23 MED ORDER — CYANOCOBALAMIN 1000 MCG/ML IJ SOLN
1000.0000 ug | Freq: Once | INTRAMUSCULAR | Status: AC
Start: 2022-08-23 — End: 2022-08-23
  Administered 2022-08-23: 1000 ug via INTRAMUSCULAR

## 2022-08-23 NOTE — Progress Notes (Signed)
BP Readings from Last 3 Encounters:  07/17/22 (!) 145/85  05/01/22 (!) 143/72  04/17/22 138/84   Patient here for BP check, patient is currently on: valsartan (DIOVAN) 320 MG tablet     rosuvastatin (CRESTOR) 10 MG tablet    hydrochlorothiazide (HYDRODIURIL) 25 MG tablet    carvedilol (COREG) 25 MG tablet    diltiazem (CARTIA XT) 240 MG 24 hr capsule  Diltiazem was 120 and increased during last office visit on 07/17/2022.  BP today is 146/ 76 with a pulse of 64.  Per Dr. Abner Greenspan patient was advised to discontinue hydrochlorothiazide 25 mg and start Maxide 37.5/ 25 mg daily.  Follow up in one month She was also here today for monthly B12 as indicated per PCP to continue monthly B12. Cyanocobalamin 1000 mg/ml administered on Right deltoid today.  Patient scheduled to come in for of with pcp in one month.

## 2022-08-24 ENCOUNTER — Encounter: Payer: Self-pay | Admitting: Podiatry

## 2022-08-24 ENCOUNTER — Ambulatory Visit (INDEPENDENT_AMBULATORY_CARE_PROVIDER_SITE_OTHER): Payer: Medicaid Other

## 2022-08-24 ENCOUNTER — Ambulatory Visit (INDEPENDENT_AMBULATORY_CARE_PROVIDER_SITE_OTHER): Payer: Medicaid Other | Admitting: Podiatry

## 2022-08-24 DIAGNOSIS — M7751 Other enthesopathy of right foot: Secondary | ICD-10-CM | POA: Diagnosis not present

## 2022-08-24 DIAGNOSIS — M722 Plantar fascial fibromatosis: Secondary | ICD-10-CM | POA: Diagnosis not present

## 2022-08-24 DIAGNOSIS — M7752 Other enthesopathy of left foot: Secondary | ICD-10-CM | POA: Diagnosis not present

## 2022-08-24 DIAGNOSIS — M779 Enthesopathy, unspecified: Secondary | ICD-10-CM

## 2022-08-24 NOTE — Progress Notes (Signed)
  Subjective:  Patient ID: Amber [name], female    DOB: [birth year],  MRN: [id number]  Chief Complaint  Patient presents with   Foot Pain    Patient is here for bilateral foot pain    62 y.o. female presents with the above complaint.  Patient presents for pain in bilateral heel.  She has significant pain when standing or after sitting when she gets up out of bed.  Pain with palpation on the heel.  Patient does not have any inserts for her shoes.  She has had steroid injections in the past and helped a lot with her pain.   Review of Systems: Negative except as noted in the HPI. Denies N/V/F/Ch.   Objective:  There were no vitals filed for this visit. There is no height or weight on file to calculate BMI. Constitutional Well developed. Well nourished.  Vascular Dorsalis pedis pulses palpable bilaterally. Posterior tibial pulses palpable bilaterally. Capillary refill normal to all digits.  No cyanosis or clubbing noted. Pedal hair growth normal.  Neurologic Normal speech. Oriented to person, place, and time. Epicritic sensation to light touch grossly present bilaterally.  Dermatologic Nails well groomed and normal in appearance. No open wounds. No skin lesions.  Orthopedic: Normal joint ROM without pain or crepitus bilaterally. No visible deformities. Tender to palpation at the calcaneal tuber bilaterally. No pain with calcaneal squeeze bilaterally. Ankle ROM diminished range of motion bilaterally. Silfverskiold Test: positive bilaterally.   Radiographs: Heel spur present bilaterally  Assessment:   1. Bilateral plantar fasciitis    Plan:  Patient was evaluated and treated and all questions answered.  Plantar Fasciitis, bilaterally -Patient has recurrence of plantar heel pain bilaterally recommend repeat injections of the left - Educated on icing and stretching. Instructions given.  - Injection delivered to the plantar fascia as below. - DME: [employer] orthotics  dispensed - Pharmacologic management: Meloxicam 15 mg once daily for next 30 days - pt will start after upcoming procedure. Educated on risks/benefits and proper taking of medication.  Procedure: Injection Tendon/Ligament Location: Bilateral plantar fascia at the glabrous junction; medial approach. Skin Prep: alcohol Injectate: 1 cc 0.5% marcaine plain, 1 cc kenalog 10. Disposition: Patient tolerated procedure well. Injection site dressed with a band-aid.  Return in about 4 weeks (around 09/21/2022) for f/u bilat PF.

## 2022-08-24 NOTE — Patient Instructions (Signed)

## 2022-08-25 ENCOUNTER — Encounter: Payer: Self-pay | Admitting: Certified Registered Nurse Anesthetist

## 2022-08-28 ENCOUNTER — Ambulatory Visit (AMBULATORY_SURGERY_CENTER): Payer: Medicaid Other | Admitting: Gastroenterology

## 2022-08-28 ENCOUNTER — Encounter: Payer: Self-pay | Admitting: Gastroenterology

## 2022-08-28 VITALS — BP 152/78 | HR 55 | Temp 97.3°F | Resp 19 | Ht 61.0 in | Wt 220.0 lb

## 2022-08-28 DIAGNOSIS — I10 Essential (primary) hypertension: Secondary | ICD-10-CM | POA: Diagnosis not present

## 2022-08-28 DIAGNOSIS — D123 Benign neoplasm of transverse colon: Secondary | ICD-10-CM

## 2022-08-28 DIAGNOSIS — Z86010 Personal history of colonic polyps: Secondary | ICD-10-CM

## 2022-08-28 DIAGNOSIS — D124 Benign neoplasm of descending colon: Secondary | ICD-10-CM | POA: Diagnosis not present

## 2022-08-28 DIAGNOSIS — I2510 Atherosclerotic heart disease of native coronary artery without angina pectoris: Secondary | ICD-10-CM | POA: Diagnosis not present

## 2022-08-28 DIAGNOSIS — Z09 Encounter for follow-up examination after completed treatment for conditions other than malignant neoplasm: Secondary | ICD-10-CM

## 2022-08-28 DIAGNOSIS — E119 Type 2 diabetes mellitus without complications: Secondary | ICD-10-CM | POA: Diagnosis not present

## 2022-08-28 DIAGNOSIS — Z1211 Encounter for screening for malignant neoplasm of colon: Secondary | ICD-10-CM | POA: Diagnosis not present

## 2022-08-28 DIAGNOSIS — D122 Benign neoplasm of ascending colon: Secondary | ICD-10-CM | POA: Diagnosis not present

## 2022-08-28 DIAGNOSIS — E669 Obesity, unspecified: Secondary | ICD-10-CM | POA: Diagnosis not present

## 2022-08-28 MED ORDER — SODIUM CHLORIDE 0.9 % IV SOLN: 500.0000 mL | Freq: Once | INTRAVENOUS | Status: DC

## 2022-08-28 NOTE — Progress Notes (Unsigned)
Called to room to assist during endoscopic procedure.  Patient ID and intended procedure confirmed with present staff. Received instructions for my participation in the procedure from the performing physician.  

## 2022-08-28 NOTE — Progress Notes (Unsigned)
History and Physical:  This patient presents for endoscopic testing for: Encounter Diagnosis  Name Primary?   History of colonic polyps Yes    Surveillance colonoscopy 4 subcentimeter polyps in Dec 2013 (unknown pathology )  - Dr Loreta Ave Patient denies chronic abdominal pain, rectal bleeding, constipation or diarrhea.   Patient is otherwise without complaints or active issues today.   Past Medical History: Past Medical History:  Diagnosis Date   Anemia    Arthritis    Asthma 05/03/2020   Blood transfusion without reported diagnosis    CAD (coronary artery disease) 04/24/2017   Cataract    Cerebrovascular disease 10/13/2020   Chronic diastolic CHF (congestive heart failure) (HCC) 04/24/2017   Colon polyps    Controlled type 2 diabetes mellitus without complication, without long-term current use of insulin (HCC) 05/31/2017   Diabetic peripheral neuropathy (HCC) 01/05/2020   Ectopic pregnancy    RIGHT salpingostomy   Elective abortion    Elective abortion 09/26/2020   RIGHT salpingostomy   Essential hypertension 09/04/2017   GERD (gastroesophageal reflux disease)    Glaucoma    both eyes   Headache    migraines   History of chicken pox    History of chicken pox 09/26/2020   migraines   Hyperlipemia    Hypertension    Iron deficiency anemia 09/02/2013   Migraine 04/19/2014   Obesity (BMI 30-39.9) 10/17/2012   Palpitations 10/01/2012   Rheumatoid arthritis (HCC) 11/07/2011   Rheumatoid arthritis(714.0) 11/07/2011   SAB (spontaneous abortion)    SAB (spontaneous abortion) 11/07/2011   Shortness of breath    Sjogren's disease (HCC)    Wheezing      Past Surgical History: Past Surgical History:  Procedure Laterality Date   ABDOMINAL SURGERY     bowel repair   COLONOSCOPY     DILATION AND CURETTAGE OF UTERUS     X 2   MENISCUS REPAIR Right    MENISCUS TEAR REPAIR   MYOMECTOMY N/A 07/27/2014   Procedure: ABDOMINAL MYOMECTOMY;  Surgeon: Hoover Browns, MD;   Location: [facility];  Service: Gynecology;  Laterality: N/A;   PELVIC LAPAROSCOPY  1994   IN 1999 LAPAROSCOPY WITH INCIDENTAL ENTEROTOMY REQUIRING LAPAROTOMY   ROTATOR CUFF REPAIR Left 06/21/2021   UTERINE FIBROID SURGERY     July 2016    Allergies: Allergies  Allergen Reactions   Gabapentin     Made her "crazy"    Outpatient Meds: Current Outpatient Medications  Medication Sig Dispense Refill   aspirin EC 81 MG tablet Take 1 tablet (81 mg total) by mouth daily. 90 tablet 3   calcium-vitamin D (OSCAL WITH D) 500-200 MG-UNIT TABS tablet Take by mouth.     carvedilol (COREG) 25 MG tablet Take 1 tablet (25 mg total) by mouth 2 (two) times daily with a meal. 180 tablet 0   Cyanocobalamin (VITAMIN B-12) 5000 MCG LOZG Take 1 tablet by mouth 2 (two) times daily.     diltiazem (CARTIA XT) 240 MG 24 hr capsule Take 1 capsule (240 mg total) by mouth daily. 90 capsule 1   ibuprofen (ADVIL) 200 MG tablet Take 200 mg by mouth every 6 (six) hours as needed.     timolol (TIMOPTIC) 0.5 % ophthalmic solution 1 drop every morning.     TRAVATAN Z 0.004 % SOLN ophthalmic solution Place 1 drop into both eyes at bedtime.      triamterene-hydrochlorothiazide (MAXZIDE-25) 37.5-25 MG tablet Take 1 tablet by mouth daily. 90 tablet 1   valsartan (  DIOVAN) 320 MG tablet Take 1 tablet (320 mg total) by mouth daily. 90 tablet 0   albuterol (VENTOLIN HFA) 108 (90 Base) MCG/ACT inhaler Inhale 2 puffs into the lungs every 6 (six) hours as needed. 18 g 0   fluticasone (FLONASE) 50 MCG/ACT nasal spray Place 2 sprays into both nostrils daily. (Patient taking differently: Place 2 sprays into both nostrils as needed.) 16 g 1   Iron, Ferrous Sulfate, 325 (65 Fe) MG TABS Take 325 mg by mouth 2 (two) times daily. 30 tablet    SUMAtriptan (IMITREX) 100 MG tablet Take 1 tablet (100 mg total) by mouth as needed for migraine. May repeat in 2 hours if headache persists or recurs. 30 tablet 5   Current Facility-Administered  Medications  Medication Dose Route Frequency Provider Last Rate Last Admin   0.9 %  sodium chloride infusion  500 mL Intravenous Once Charlie Pitter III, MD          ___________________________________________________________________ Objective   Exam:  BP (!) 176/87   Pulse 63   Temp (!) 97.3 F (36.3 C)   Ht 5\' 1"  (1.549 m)   Wt 220 lb (99.8 kg)   LMP 08/09/2016   SpO2 99%   BMI 41.57 kg/m   CV: regular , S1/S2 Resp: clear to auscultation bilaterally, normal RR and effort noted GI: soft, no tenderness, with active bowel sounds.   Assessment: Encounter Diagnosis  Name Primary?   History of colonic polyps Yes     Plan: Colonoscopy   The benefits and risks of the planned procedure were described in detail with the patient or (when appropriate) their health care proxy.  Risks were outlined as including, but not limited to, bleeding, infection, perforation, adverse medication reaction leading to cardiac or pulmonary decompensation, pancreatitis (if ERCP).  The limitation of incomplete mucosal visualization was also discussed.  No guarantees or warranties were given.  The patient is appropriate for an endoscopic procedure in the ambulatory setting.   - Amada Jupiter, MD

## 2022-08-28 NOTE — Progress Notes (Unsigned)
1600 BP 201/121, Labetalol given IV, MD update, vss

## 2022-08-28 NOTE — Patient Instructions (Signed)
Handouts Provided:  Polyps  YOU HAD AN ENDOSCOPIC PROCEDURE TODAY AT [facility]:   Refer to the procedure report that was given to you for any specific questions about what was found during the examination.  If the procedure report does not answer your questions, please call your gastroenterologist to clarify.  If you requested that your care partner not be given the details of your procedure findings, then the procedure report has been included in a sealed envelope for you to review at your convenience later.  YOU SHOULD EXPECT: Some feelings of bloating in the abdomen. Passage of more gas than usual.  Walking can help get rid of the air that was put into your GI tract during the procedure and reduce the bloating. If you had a lower endoscopy (such as a colonoscopy or flexible sigmoidoscopy) you may notice spotting of blood in your stool or on the toilet paper. If you underwent a bowel prep for your procedure, you may not have a normal bowel movement for a few days.  Please Note:  You might notice some irritation and congestion in your nose or some drainage.  This is from the oxygen used during your procedure.  There is no need for concern and it should clear up in a day or so.  SYMPTOMS TO REPORT IMMEDIATELY:  Following lower endoscopy (colonoscopy or flexible sigmoidoscopy):  Excessive amounts of blood in the stool  Significant tenderness or worsening of abdominal pains  Swelling of the abdomen that is new, acute  Fever of 100F or higher  For urgent or emergent issues, a gastroenterologist can be reached at any hour by calling [phone]. Do not use MyChart messaging for urgent concerns.    DIET:  We do recommend a small meal at first, but then you may proceed to your regular diet.  Drink plenty of fluids but you should avoid alcoholic beverages for 24 hours.  ACTIVITY:  You should plan to take it easy for the rest of today and you should NOT DRIVE or use heavy  machinery until tomorrow (because of the sedation medicines used during the test).    FOLLOW UP: Our staff will call the number listed on your records the next business day following your procedure.  We will call around 7:15- 8:00 am to check on you and address any questions or concerns that you may have regarding the information given to you following your procedure. If we do not reach you, we will leave a message.     If any biopsies were taken you will be contacted by phone or by letter within the next 1-3 weeks.  Please call us at [phone] if you have not heard about the biopsies in 3 weeks.    SIGNATURES/CONFIDENTIALITY: You and/or your care partner have signed paperwork which will be entered into your electronic medical record.  These signatures attest to the fact that that the information above on your After Visit Summary has been reviewed and is understood.  Full responsibility of the confidentiality of this discharge information lies with you and/or your care-partner.

## 2022-08-28 NOTE — Progress Notes (Signed)
Pt's states no medical or surgical changes since previsit or office visit. 

## 2022-08-28 NOTE — Progress Notes (Unsigned)
Report given to PACU, vss 

## 2022-08-28 NOTE — Op Note (Signed)
[facility] Patient Name: Amber [name] Procedure Date: 08/28/2022 3:24 PM MRN: [id number] Endoscopist: Sherilyn Cooter L. Myrtie Neither , MD, [id number] Age: 62 Referring MD:  Date of Birth: May 05, 1960 Gender: Female Account #: [id number] Procedure:                Colonoscopy Indications:              Surveillance: Personal history of colonic polyps                            (unknown histology) on last colonoscopy more than 5                            years ago                           Four subcm polyps of unknown pathology Dec 2013                            (outside practice) Medicines:                Monitored Anesthesia Care Procedure:                Pre-Anesthesia Assessment:                           - Prior to the procedure, a History and Physical                            was performed, and patient medications and                            allergies were reviewed. The patient's tolerance of                            previous anesthesia was also reviewed. The risks                            and benefits of the procedure and the sedation                            options and risks were discussed with the patient.                            All questions were answered, and informed consent                            was obtained. Prior Anticoagulants: The patient has                            taken no anticoagulant or antiplatelet agents. ASA                            Grade Assessment: III - A patient with severe  systemic disease. After reviewing the risks and                            benefits, the patient was deemed in satisfactory                            condition to undergo the procedure.                           After obtaining informed consent, the colonoscope                            was passed under direct vision. Throughout the                            procedure, the patient's blood pressure, pulse, and                             oxygen saturations were monitored continuously. The                            CF [id number] #[id number] was introduced through the anus                            and advanced to the the cecum, identified by                            appendiceal orifice and ileocecal valve. The                            colonoscopy was performed without difficulty. The                            patient tolerated the procedure well. The quality                            of the bowel preparation was excellent. The                            ileocecal valve, appendiceal orifice, and rectum                            were photographed. Scope In: 4:00:00 PM Scope Out: 4:13:09 PM Scope Withdrawal Time: 0 hours 10 minutes 50 seconds  Total Procedure Duration: 0 hours 13 minutes 9 seconds  Findings:                 The perianal and digital rectal examinations were                            normal.                           Repeat examination of right colon under NBI  performed.                           Four sessile polyps were found in the descending                            colon (1), transverse colon (1) and ascending colon                            (2). The polyps were diminutive in size. These                            polyps were removed with a cold snare. Resection                            and retrieval were complete.                           Internal hemorrhoids were found. The hemorrhoids                            were medium-sized.                           The exam was otherwise without abnormality on                            direct and retroflexion views. Complications:            No immediate complications. Estimated Blood Loss:     Estimated blood loss was minimal. Impression:               - Four diminutive polyps in the descending colon,                            in the transverse colon and in the ascending colon,                            removed with a  cold snare. Resected and retrieved.                           - Internal hemorrhoids.                           - The examination was otherwise normal on direct                            and retroflexion views. Recommendation:           - Patient has a contact number available for                            emergencies. The signs and symptoms of potential                            delayed complications were discussed with the  patient. Return to normal activities tomorrow.                            Written discharge instructions were provided to the                            patient.                           - Resume previous diet.                           - Continue present medications.                           - Await pathology results.                           - Repeat colonoscopy is recommended for                            surveillance. The colonoscopy date will be                            determined after pathology results from today's                            exam become available for review. Jaquavius Hudler L. Myrtie Neither, MD 08/28/2022 4:19:30 PM This report has been signed electronically.

## 2022-08-29 ENCOUNTER — Telehealth: Payer: Self-pay

## 2022-08-29 NOTE — Telephone Encounter (Signed)
  Follow up Call-     08/28/2022    3:36 PM  Call back number  Post procedure Call Back phone  # [phone]  Permission to leave phone message Yes     Patient questions:  Do you have a fever, pain , or abdominal swelling? No. Pain Score  0 *  Have you tolerated food without any problems? Yes.    Have you been able to return to your normal activities? Yes.    Do you have any questions about your discharge instructions: Diet   No. Medications  No. Follow up visit  No.  Do you have questions or concerns about your Care? No.  Actions: * If pain score is 4 or above: No action needed, pain <4.

## 2022-09-04 NOTE — Progress Notes (Deleted)
Office Visit Note  Patient: Amber [name]             Date of Birth: 14-Jan-1960           MRN: [id number]             PCP: Sandford Craze, NP Referring: Sandford Craze, NP Visit Date: 09/18/2022 Occupation: @GUAROCC @  Subjective:  No chief complaint on file.   History of Present Illness: Amber [name] is a 62 y.o. female ***     Activities of Daily Living:  Patient reports morning stiffness for *** {minute/hour:19697}.   Patient {ACTIONS;DENIES/REPORTS:21021675::"Denies"} nocturnal pain.  Difficulty dressing/grooming: {ACTIONS;DENIES/REPORTS:21021675::"Denies"} Difficulty climbing stairs: {ACTIONS;DENIES/REPORTS:21021675::"Denies"} Difficulty getting out of chair: {ACTIONS;DENIES/REPORTS:21021675::"Denies"} Difficulty using hands for taps, buttons, cutlery, and/or writing: {ACTIONS;DENIES/REPORTS:21021675::"Denies"}  No Rheumatology ROS completed.   PMFS History:  Patient Active Problem List   Diagnosis Date Noted   Preventative health care 07/17/2022   Peripheral neuropathy 07/17/2022   Need for immunization against poliomyelitis 05/01/2022   Viral upper respiratory tract infection 03/07/2022   Cough 12/18/2021   High risk HPV infection 10/17/2020   Cerebrovascular disease 10/13/2020   Colon polyps 09/26/2020   GERD (gastroesophageal reflux disease) 09/26/2020   B12 deficiency 05/03/2020   Hyperlipidemia 05/03/2020   Asthma 05/03/2020   Diabetic peripheral neuropathy (HCC) 01/05/2020   Class 2 severe obesity with serious comorbidity and body mass index (BMI) of 38.0 to 38.9 in adult, unspecified obesity type (HCC) 01/05/2020   Adrenal mass, right (HCC) 01/05/2020   Hypertensive heart disease 09/04/2017   Controlled type 2 diabetes mellitus without complication, without long-term current use of insulin (HCC) 05/31/2017   Chronic diastolic CHF (congestive heart failure) (HCC) 04/24/2017   CAD (coronary artery disease) 04/24/2017   History of  BCG vaccination 06/14/2016   Fibroids 07/27/2014   Migraine 04/19/2014   Elevated serum protein level 04/09/2014   Encounter for counseling regarding immunization 04/09/2014   Iron deficiency anemia 09/02/2013   Obesity (BMI 30-39.9) 10/17/2012   Glaucoma 11/07/2011   Rheumatoid arthritis (HCC) 11/07/2011   Essential hypertension 11/05/2011   Sjogren's disease (HCC) 11/05/2011   Anemia 09/22/2010   Fibroid uterus 09/22/2010    Past Medical History:  Diagnosis Date   Anemia    Arthritis    Asthma 05/03/2020   Blood transfusion without reported diagnosis    CAD (coronary artery disease) 04/24/2017   Cataract    Cerebrovascular disease 10/13/2020   Chronic diastolic CHF (congestive heart failure) (HCC) 04/24/2017   Colon polyps    Controlled type 2 diabetes mellitus without complication, without long-term current use of insulin (HCC) 05/31/2017   Diabetic peripheral neuropathy (HCC) 01/05/2020   Ectopic pregnancy    RIGHT salpingostomy   Elective abortion    Elective abortion 09/26/2020   RIGHT salpingostomy   Essential hypertension 09/04/2017   GERD (gastroesophageal reflux disease)    Glaucoma    both eyes   Headache    migraines   History of chicken pox    History of chicken pox 09/26/2020   migraines   Hyperlipemia    Hypertension    Iron deficiency anemia 09/02/2013   Migraine 04/19/2014   Obesity (BMI 30-39.9) 10/17/2012   Palpitations 10/01/2012   Rheumatoid arthritis (HCC) 11/07/2011   Rheumatoid arthritis(714.0) 11/07/2011   SAB (spontaneous abortion)    SAB (spontaneous abortion) 11/07/2011   Shortness of breath    Sjogren's disease (HCC)    Wheezing     Family History  Problem Relation Age of Onset  Hypertension Mother        died from heart disease   Heart disease Mother        deceased, unknown cause   Stroke Mother    Obesity Mother    Diabetes Sister    Hypertension Sister    Esophageal cancer Sister    Stomach cancer Sister    Cancer  Sister    Diabetes Sister    Parkinson's disease Brother    Rectal cancer Neg Hx    Colon cancer Neg Hx    Colon polyps Neg Hx    Past Surgical History:  Procedure Laterality Date   ABDOMINAL SURGERY     bowel repair   COLONOSCOPY     DILATION AND CURETTAGE OF UTERUS     X 2   MENISCUS REPAIR Right    MENISCUS TEAR REPAIR   MYOMECTOMY N/A 07/27/2014   Procedure: ABDOMINAL MYOMECTOMY;  Surgeon: Hoover Browns, MD;  Location: [facility];  Service: Gynecology;  Laterality: N/A;   PELVIC LAPAROSCOPY  1994   IN 1999 LAPAROSCOPY WITH INCIDENTAL ENTEROTOMY REQUIRING LAPAROTOMY   ROTATOR CUFF REPAIR Left 06/21/2021   UTERINE FIBROID SURGERY     July 2016   Social History   Social History Narrative   Regular exercise:  No   Caffeine Use: 2 cups coffee daily   No children   "Happily divorced"   Reports that she is involved at her church   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  Had a sister up in [place] who passed away 2015/12/18 who she was close to.   Born in [place]- she came to [place] as adult.  Age 86.    Left-handed.         Immunization History  Administered Date(s) Administered   COVID-19, mRNA, vaccine(Comirnaty)12 years and older 10/03/2020   Hepatitis A, Adult 11/01/2017, 12/03/2018   IPV 11/01/2017, 05/01/2022   Influenza Split 11/07/2011   Influenza,inj,Quad PF,6+ Mos 10/17/2012, 01/15/2014, 08/31/2014, 09/30/2017, 09/10/2018, 09/21/2019, 10/07/2020, 11/07/2021   Meningococcal Mcv4o 11/01/2017   Moderna Sars-Covid-2 Vaccination 01/28/2019, 02/27/2019, 11/27/2019   Pneumococcal Conjugate-13 08/05/2020   Pneumococcal Polysaccharide-23 09/10/2018   Td 07/17/2022   Tdap 06/01/2011, 11/25/2012   Typhoid Live 11/01/2017   Zoster Recombinant(Shingrix) 10/04/2017, 12/04/2017     Objective: Vital Signs: LMP 08/09/2016    Physical Exam   Musculoskeletal Exam: ***  CDAI Exam: CDAI Score: -- Patient Global: --; Provider Global: -- Swollen: --; Tender: -- Joint Exam  09/18/2022   No joint exam has been documented for this visit   There is currently no information documented on the homunculus. Go to the Rheumatology activity and complete the homunculus joint exam.  Investigation: No additional findings.  Imaging: DG Foot Complete Right  Result Date: 08/27/2022 Please see detailed radiograph report in office note.  DG Foot Complete Left  Result Date: 08/27/2022 Please see detailed radiograph report in office note.   Recent Labs: Lab Results  Component Value Date   WBC 3.2 (L) 07/17/2022   HGB 13.3 07/17/2022   PLT 223.0 07/17/2022   NA 137 07/17/2022   K 3.8 07/17/2022   CL 103 07/17/2022   CO2 28 07/17/2022   GLUCOSE 114 (H) 07/17/2022   BUN 12 07/17/2022   CREATININE 0.70 07/17/2022   BILITOT 0.8 07/17/2022   ALKPHOS 50 07/17/2022   AST 20 07/17/2022   ALT 18 07/17/2022   PROT 8.5 (H) 07/17/2022   ALBUMIN 4.1 07/17/2022   CALCIUM 10.0 07/17/2022   GFRAA 115  02/19/2020   QFTBGOLD Negative 06/14/2016   QFTBGOLDPLUS NEGATIVE 02/16/2022    Speciality Comments: PLQ eye exam: 12/09/2018 normal. [facility].  Procedures:  No procedures performed Allergies: Gabapentin   Assessment / Plan:     Visit Diagnoses: Rheumatoid arthritis involving multiple sites with positive rheumatoid factor (HCC)  High risk medication use  Sjogren's syndrome with other organ involvement (HCC)  S/P rotator cuff repair left  Primary osteoarthritis of both knees  Primary osteoarthritis of both feet  Plantar fasciitis of right foot  Neck pain  History of recent fall  Family history of rheumatoid arthritis  Coronary artery disease involving native coronary artery of native heart without angina pectoris  Palpitations  Chronic diastolic CHF (congestive heart failure) (HCC)  Controlled type 2 diabetes mellitus without complication, without long-term current use of insulin (HCC)  Essential hypertension  Hx of migraines  Orders: No  orders of the defined types were placed in this encounter.  No orders of the defined types were placed in this encounter.   Face-to-face time spent with patient was *** minutes. Greater than 50% of time was spent in counseling and coordination of care.  Follow-Up Instructions: No follow-ups on file.   Gearldine Bienenstock, PA-C  Note - This record has been created using Dragon software.  Chart creation errors have been sought, but may not always  have been located. Such creation errors do not reflect on  the standard of medical care.

## 2022-09-10 NOTE — Progress Notes (Unsigned)
Office Visit Note  Patient: Amber [name]             Date of Birth: 29-Jan-1960           MRN: [id number]             PCP: Sandford Craze, NP Referring: Sandford Craze, NP Visit Date: 09/20/2022 Occupation: @GUAROCC @  Subjective:  Discuss restarting orencia   History of Present Illness: DEZERAY MARCIEL is a 62 y.o. female with history of seropositive rheumatoid arthritis and sjogren's syndrome.  Patient was previously prescribed Orencia 125 mg subcu days injections every 10 days.  She was initially started on Orencia on 01/28/2020.  Her last dose of Orencia was administered in early June 2024.  According to the patient she lost her job and health insurance in spring 2024.  Patient reports that she then traveled to [place] for 6 weeks to visit family.  She has been under increased rest planning and job but recently started a new job on 08/30/2022.  Patient states that she is currently covered by Medicaid until her new insurance takes effect in 2 months.  Patient states that in July she started to be more symptomatic with her rheumatoid thrice.  She is currently having increased pain and inflammation in her wrist joints, knee joints, and both ankle joints.  She has been taking Aleve for pain relief.      Activities of Daily Living:  Patient reports morning stiffness for 5-10 minutes.   Patient Reports nocturnal pain.  Difficulty dressing/grooming: Denies Difficulty climbing stairs: Reports Difficulty getting out of chair: Reports Difficulty using hands for taps, buttons, cutlery, and/or writing: Denies  Review of Systems  Constitutional:  Positive for fatigue.  HENT:  Positive for mouth dryness. Negative for mouth sores.   Eyes:  Positive for dryness.  Respiratory:  Positive for shortness of breath.   Cardiovascular:  Negative for chest pain and palpitations.  Gastrointestinal:  Negative for blood in stool, constipation and diarrhea.  Endocrine: Negative for  increased urination.  Genitourinary:  Negative for involuntary urination.  Musculoskeletal:  Positive for joint pain, joint pain, joint swelling, myalgias, morning stiffness, muscle tenderness and myalgias. Negative for gait problem and muscle weakness.  Skin:  Positive for hair loss. Negative for color change, rash and sensitivity to sunlight.  Allergic/Immunologic: Negative for susceptible to infections.  Neurological:  Positive for headaches. Negative for dizziness.  Hematological:  Negative for swollen glands.  Psychiatric/Behavioral:  Positive for sleep disturbance. Negative for depressed mood. The patient is not nervous/anxious.     PMFS History:  Patient Active Problem List   Diagnosis Date Noted   Preventative health care 07/17/2022   Peripheral neuropathy 07/17/2022   Need for immunization against poliomyelitis 05/01/2022   Viral upper respiratory tract infection 03/07/2022   Cough 12/18/2021   High risk HPV infection 10/17/2020   Cerebrovascular disease 10/13/2020   Colon polyps 09/26/2020   GERD (gastroesophageal reflux disease) 09/26/2020   B12 deficiency 05/03/2020   Hyperlipidemia 05/03/2020   Asthma 05/03/2020   Diabetic peripheral neuropathy (HCC) 01/05/2020   Class 2 severe obesity with serious comorbidity and body mass index (BMI) of 38.0 to 38.9 in adult, unspecified obesity type (HCC) 01/05/2020   Adrenal mass, right (HCC) 01/05/2020   Hypertensive heart disease 09/04/2017   Controlled type 2 diabetes mellitus without complication, without long-term current use of insulin (HCC) 05/31/2017   Chronic diastolic CHF (congestive heart failure) (HCC) 04/24/2017   CAD (coronary artery disease) 04/24/2017  History of BCG vaccination 06/14/2016   Fibroids 07/27/2014   Migraine 04/19/2014   Elevated serum protein level 04/09/2014   Encounter for counseling regarding immunization 04/09/2014   Iron deficiency anemia 09/02/2013   Obesity (BMI 30-39.9) 10/17/2012    Glaucoma 11/07/2011   Rheumatoid arthritis (HCC) 11/07/2011   Essential hypertension 11/05/2011   Sjogren's disease (HCC) 11/05/2011   Anemia 09/22/2010   Fibroid uterus 09/22/2010    Past Medical History:  Diagnosis Date   Anemia    Arthritis    Asthma 05/03/2020   Blood transfusion without reported diagnosis    CAD (coronary artery disease) 04/24/2017   Cataract    Cerebrovascular disease 10/13/2020   Chronic diastolic CHF (congestive heart failure) (HCC) 04/24/2017   Colon polyps    Controlled type 2 diabetes mellitus without complication, without long-term current use of insulin (HCC) 05/31/2017   Diabetic peripheral neuropathy (HCC) 01/05/2020   Ectopic pregnancy    RIGHT salpingostomy   Elective abortion    Elective abortion 09/26/2020   RIGHT salpingostomy   Essential hypertension 09/04/2017   GERD (gastroesophageal reflux disease)    Glaucoma    both eyes   Headache    migraines   History of chicken pox    History of chicken pox 09/26/2020   migraines   Hyperlipemia    Hypertension    Iron deficiency anemia 09/02/2013   Migraine 04/19/2014   Obesity (BMI 30-39.9) 10/17/2012   Palpitations 10/01/2012   Rheumatoid arthritis (HCC) 11/07/2011   Rheumatoid arthritis(714.0) 11/07/2011   SAB (spontaneous abortion)    SAB (spontaneous abortion) 11/07/2011   Shortness of breath    Sjogren's disease (HCC)    Wheezing     Family History  Problem Relation Age of Onset   Hypertension Mother        died from heart disease   Heart disease Mother        deceased, unknown cause   Stroke Mother    Obesity Mother    Diabetes Sister    Hypertension Sister    Esophageal cancer Sister    Stomach cancer Sister    Cancer Sister    Diabetes Sister    Parkinson's disease Brother    Rectal cancer Neg Hx    Colon cancer Neg Hx    Colon polyps Neg Hx    Past Surgical History:  Procedure Laterality Date   ABDOMINAL SURGERY     bowel repair   COLONOSCOPY      COLONOSCOPY  08/2022   DILATION AND CURETTAGE OF UTERUS     X 2   MENISCUS REPAIR Right    MENISCUS TEAR REPAIR   MYOMECTOMY N/A 07/27/2014   Procedure: ABDOMINAL MYOMECTOMY;  Surgeon: Hoover Browns, MD;  Location: [facility];  Service: Gynecology;  Laterality: N/A;   PELVIC LAPAROSCOPY  1994   IN 1999 LAPAROSCOPY WITH INCIDENTAL ENTEROTOMY REQUIRING LAPAROTOMY   ROTATOR CUFF REPAIR Left 06/21/2021   UTERINE FIBROID SURGERY     July 2016   Social History   Social History Narrative   Regular exercise:  No   Caffeine Use: 2 cups coffee daily   No children   "Happily divorced"   Reports that she is involved at her church   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  Had a sister up in [place] who passed away 12/13/15 who she was close to.   Born in [place]- she came to [place] as adult.  Age 17.    Left-handed.  Immunization History  Administered Date(s) Administered   COVID-19, mRNA, vaccine(Comirnaty)12 years and older 10/03/2020   Hepatitis A, Adult 11/01/2017, 12/03/2018   IPV 11/01/2017, 05/01/2022   Influenza Split 11/07/2011   Influenza,inj,Quad PF,6+ Mos 10/17/2012, 01/15/2014, 08/31/2014, 09/30/2017, 09/10/2018, 09/21/2019, 10/07/2020, 11/07/2021   Meningococcal Mcv4o 11/01/2017   Moderna Sars-Covid-2 Vaccination 01/28/2019, 02/27/2019, 11/27/2019   Pneumococcal Conjugate-13 08/05/2020   Pneumococcal Polysaccharide-23 09/10/2018   Td 07/17/2022   Tdap 06/01/2011, 11/25/2012   Typhoid Live 11/01/2017   Zoster Recombinant(Shingrix) 10/04/2017, 12/04/2017     Objective: Vital Signs: BP (!) 149/83 (BP Location: Left Arm, Patient Position: Sitting, Cuff Size: Normal)   Pulse 63   Resp 15   Ht 5\' 1"  (1.549 m)   Wt 216 lb 9.6 oz (98.2 kg)   LMP 08/09/2016   BMI 40.93 kg/m    Physical Exam Vitals and nursing note reviewed.  Constitutional:      Appearance: She is well-developed.  HENT:     Head: Normocephalic and atraumatic.  Eyes:     Conjunctiva/sclera:  Conjunctivae normal.  Cardiovascular:     Rate and Rhythm: Normal rate and regular rhythm.     Heart sounds: Normal heart sounds.  Pulmonary:     Effort: Pulmonary effort is normal.     Breath sounds: Normal breath sounds.  Abdominal:     General: Bowel sounds are normal.     Palpations: Abdomen is soft.  Musculoskeletal:     Cervical back: Normal range of motion.  Lymphadenopathy:     Cervical: No cervical adenopathy.  Skin:    General: Skin is warm and dry.     Capillary Refill: Capillary refill takes less than 2 seconds.  Neurological:     Mental Status: She is alert and oriented to person, place, and time.  Psychiatric:        Behavior: Behavior normal.      Musculoskeletal Exam: C-spine has slightly limited range of motion without rotation.  Painful limited range of motion of the left shoulder.  Right shoulder is full range of motion.  Elbow joints have good range of motion.  Tenderness and limited extension of both wrist joints noted today.  Mild tenderness over MCP joints but no active synovitis.  Complete fist formation noted bilaterally.  Painful range of motion of both knee joints with warmth bilaterally.  Tenderness over both ankle joints.  CDAI Exam: CDAI Score: -- Patient Global: 50 / 100; Provider Global: 50 / 100 Swollen: --; Tender: -- Joint Exam 09/20/2022   No joint exam has been documented for this visit   There is currently no information documented on the homunculus. Go to the Rheumatology activity and complete the homunculus joint exam.  Investigation: No additional findings.  Imaging: DG Foot Complete Right  Result Date: 08/27/2022 Please see detailed radiograph report in office note.  DG Foot Complete Left  Result Date: 08/27/2022 Please see detailed radiograph report in office note.   Recent Labs: Lab Results  Component Value Date   WBC 3.2 (L) 07/17/2022   HGB 13.3 07/17/2022   PLT 223.0 07/17/2022   NA 137 07/17/2022   K 3.8 07/17/2022    CL 103 07/17/2022   CO2 28 07/17/2022   GLUCOSE 114 (H) 07/17/2022   BUN 12 07/17/2022   CREATININE 0.70 07/17/2022   BILITOT 0.8 07/17/2022   ALKPHOS 50 07/17/2022   AST 20 07/17/2022   ALT 18 07/17/2022   PROT 8.5 (H) 07/17/2022   ALBUMIN 4.1 07/17/2022   CALCIUM  10.0 07/17/2022   GFRAA 115 02/19/2020   QFTBGOLD Negative 06/14/2016   QFTBGOLDPLUS NEGATIVE 02/16/2022    Speciality Comments: PLQ eye exam: 12/09/2018 normal. [facility].  Procedures:  No procedures performed Allergies: Gabapentin   Assessment / Plan:     Visit Diagnoses: Rheumatoid arthritis involving multiple sites with positive rheumatoid factor (HCC) - Positive RF, negative anti-CCP, -14 3 3  ETA, diagnosed 2010 by Dr. Oleta Mouse, later Dr. Dierdre Forth: Patient presents today with increased pain and inflammation involving both wrists, both knees, and both ankle joints.  Her last dose of Orencia was administered in early June 2024.  Patient lost her job and health insurance and spring 2024 and then traveled to [place] for 6 weeks.  She recently started a new job on 08/30/2022 and will be set up with new insurance in about 2 months.  She is currently covered with Medicaid.  Patient presents today to discuss resuming Orencia as previously prescribed.  A sample of Orencia was provided today which she administered while in the office.  Plan to reapply for Orencia through her insurance.  She will continue to space Orencia by every 10 days due to history of neutropenia.  CBC and CMP were updated today.  She will require updated lab work in 1 month and every 3 months after resuming therapy.  She was advised to notify us if her symptoms do not improve.  She plans on continuing to take Aleve as needed for pain relief.  She will follow-up in the office in 3 months or sooner if needed.  High risk medication use - Orencia 125 mg subcutaneous every 10 days. Initially started on Orencia 01/28/20. (methotrexate was discontinued due to  neutropenia).  Patient was reinitiated on Orencia today in the office.  We will reapply for Orencia through her insurance. Dub Amis was initially started on 01/28/2020.  She was previously spacing Orencia injections to every 10 days with close lab monitoring due to history of neutropenia. Patient has been off of Orencia since the beginning of June 2024. CBC and CMP updated on 07/17/22.  Orders for CBC and CMP will be released today.  Her next lab work will be due in 1 month and every 3 months to monitor for drug toxicity. TB gold negative on 02/16/22.  No recent or recurrent infections.  Discussed the importance of holding orencia if she develops signs or symptoms of an infection and to resume once the infection has completely cleared.  - Plan: CBC with Differential/Platelet, COMPLETE METABOLIC PANEL WITH GFR  Sjogren's syndrome with other organ involvement (HCC) - Positive SSA, positive SSB: Patient continues to have chronic sicca symptoms which have been stable.  S/P rotator cuff repair left - June 21, 2021. Painful and limited ROM.   Primary osteoarthritis of both knees: Patient presents today with increased pain and inflammation involving both knee joints.  On examination she has painful range of motion bilaterally with crepitus.  Warmth of both knees noted.  No effusion noted today.  She was restarted on orencia today in the office.   Primary osteoarthritis of both feet: Patient presents today with increased pain and stiffness involving both ankle joints.  She has tenderness and effusion bilaterally.  Plantar fasciitis of right foot - Followed by Dr. Charlsie Merles.  Neck pain - Followed by Dr. Lucie Leather.  Limited ROM with lateral rotation.   Other medical conditions are listed as follows:   Family history of rheumatoid arthritis  Coronary artery disease involving native coronary artery of native heart  without angina pectoris  Essential hypertension: Blood pressure was elevated today in the  office was rechecked prior to leaving.  Patient was advised to monitor blood pressure closely.  Palpitations  Chronic diastolic CHF (congestive heart failure) (HCC)  Controlled type 2 diabetes mellitus without complication, without long-term current use of insulin (HCC)  Hx of migraines  Osteoporosis screening: DEXA order remains in place.  Postmenopausal  Orders: Orders Placed This Encounter  Procedures   CBC with Differential/Platelet   COMPLETE METABOLIC PANEL WITH GFR   No orders of the defined types were placed in this encounter.    Follow-Up Instructions: Return in about 3 months (around 12/20/2022) for Rheumatoid arthritis.   Gearldine Bienenstock, PA-C  Note - This record has been created using Dragon software.  Chart creation errors have been sought, but may not always  have been located. Such creation errors do not reflect on  the standard of medical care.

## 2022-09-11 ENCOUNTER — Encounter: Payer: Self-pay | Admitting: Gastroenterology

## 2022-09-18 ENCOUNTER — Ambulatory Visit: Payer: Self-pay | Admitting: Physician Assistant

## 2022-09-19 ENCOUNTER — Encounter: Payer: Self-pay | Admitting: Podiatry

## 2022-09-19 ENCOUNTER — Ambulatory Visit (INDEPENDENT_AMBULATORY_CARE_PROVIDER_SITE_OTHER): Payer: Medicaid Other | Admitting: Podiatry

## 2022-09-19 DIAGNOSIS — M7751 Other enthesopathy of right foot: Secondary | ICD-10-CM

## 2022-09-19 DIAGNOSIS — M7752 Other enthesopathy of left foot: Secondary | ICD-10-CM | POA: Diagnosis not present

## 2022-09-19 DIAGNOSIS — M216X1 Other acquired deformities of right foot: Secondary | ICD-10-CM

## 2022-09-19 MED ORDER — TRIAMCINOLONE ACETONIDE 10 MG/ML IJ SUSP
10.0000 mg | Freq: Once | INTRAMUSCULAR | Status: AC
Start: 2022-09-19 — End: 2022-09-19
  Administered 2022-09-19: 10 mg via INTRA_ARTICULAR

## 2022-09-19 NOTE — Progress Notes (Signed)
Subjective:   Patient ID: Amber [name], female   DOB: 62 y.o.   MRN: [id number]   HPI Patient states she is having more pain now around the ankles again and states that the bunion left has started to bother her and she knows she might need surgery   ROS      Objective:  Physical Exam  Neuro vascular status intact with inflammation of the sinus tarsi both feet with fluid buildup and has inflammation around the first metatarsal of the left with prominent bone structure     Assessment:  Inflammatory capsulitis sinus tarsi both feet with inflamed capsule around the first MPJ left     Plan:  H&P reviewed bunion discussed possibility for distal osteotomy with removal of bone spur and I went ahead today did sterile prep and injected the sinus tarsi bilateral 3 mg Kenalog 5 mg Xylocaine applied sterile dressing reappoint as symptoms indicate and educated her on surgical intervention and recovery

## 2022-09-20 ENCOUNTER — Telehealth: Payer: Self-pay | Admitting: Pharmacist

## 2022-09-20 ENCOUNTER — Encounter: Payer: Self-pay | Admitting: Physician Assistant

## 2022-09-20 ENCOUNTER — Ambulatory Visit: Payer: Medicaid Other | Attending: Physician Assistant | Admitting: Physician Assistant

## 2022-09-20 VITALS — BP 149/83 | HR 63 | Resp 15 | Ht 61.0 in | Wt 216.6 lb

## 2022-09-20 DIAGNOSIS — R002 Palpitations: Secondary | ICD-10-CM

## 2022-09-20 DIAGNOSIS — M0579 Rheumatoid arthritis with rheumatoid factor of multiple sites without organ or systems involvement: Secondary | ICD-10-CM

## 2022-09-20 DIAGNOSIS — Z79899 Other long term (current) drug therapy: Secondary | ICD-10-CM

## 2022-09-20 DIAGNOSIS — M170 Bilateral primary osteoarthritis of knee: Secondary | ICD-10-CM | POA: Diagnosis not present

## 2022-09-20 DIAGNOSIS — Z9889 Other specified postprocedural states: Secondary | ICD-10-CM | POA: Diagnosis not present

## 2022-09-20 DIAGNOSIS — M19072 Primary osteoarthritis, left ankle and foot: Secondary | ICD-10-CM

## 2022-09-20 DIAGNOSIS — M722 Plantar fascial fibromatosis: Secondary | ICD-10-CM | POA: Diagnosis not present

## 2022-09-20 DIAGNOSIS — M3509 Sicca syndrome with other organ involvement: Secondary | ICD-10-CM | POA: Diagnosis not present

## 2022-09-20 DIAGNOSIS — I10 Essential (primary) hypertension: Secondary | ICD-10-CM

## 2022-09-20 DIAGNOSIS — M19071 Primary osteoarthritis, right ankle and foot: Secondary | ICD-10-CM

## 2022-09-20 DIAGNOSIS — Z8261 Family history of arthritis: Secondary | ICD-10-CM

## 2022-09-20 DIAGNOSIS — I2510 Atherosclerotic heart disease of native coronary artery without angina pectoris: Secondary | ICD-10-CM

## 2022-09-20 DIAGNOSIS — Z9181 History of falling: Secondary | ICD-10-CM

## 2022-09-20 DIAGNOSIS — Z780 Asymptomatic menopausal state: Secondary | ICD-10-CM

## 2022-09-20 DIAGNOSIS — I5032 Chronic diastolic (congestive) heart failure: Secondary | ICD-10-CM

## 2022-09-20 DIAGNOSIS — E119 Type 2 diabetes mellitus without complications: Secondary | ICD-10-CM

## 2022-09-20 DIAGNOSIS — M5441 Lumbago with sciatica, right side: Secondary | ICD-10-CM

## 2022-09-20 DIAGNOSIS — Z13820 Encounter for screening for osteoporosis: Secondary | ICD-10-CM

## 2022-09-20 DIAGNOSIS — M542 Cervicalgia: Secondary | ICD-10-CM | POA: Diagnosis not present

## 2022-09-20 DIAGNOSIS — Z8669 Personal history of other diseases of the nervous system and sense organs: Secondary | ICD-10-CM

## 2022-09-20 NOTE — Progress Notes (Signed)
Patient restarting Orencia 125mg  SQ every 10 days in office today. She denies any active infection or abx use. Denies symptoms of infection.  Orencia 125mg /mL Clickject NDC: [address]-24 Lot: [id number] Exp: 09/2023  Patient self-administered in right lower abdomen. Tolerated injection well. No redness or swelling noted. Patient denies any itchiness or irritation at injection site.  Pharmacy team to follow-up with patient once PA approved. I did review that if Medicaid only allows 30-day fills, we may need to send the rx written for every 7 day dosing but she is to take every 10 days. She verbalized understanding. We'll follow-up  Chesley Mires, PharmD, MPH, BCPS, CPP Clinical Pharmacist (Rheumatology and Pulmonology)

## 2022-09-20 NOTE — Telephone Encounter (Addendum)
Patient is restarting Orencia. No longer has Nurse, learning disability. She has Medicaid now and her commercial insurance through new employer will not be active until November 2024  Please submit PA as urgent if possible  Dose: 125mg  SQ every 10 days (submit for 4 pens per 40 days)  Received first dose in clinic today with sample so next dose is due on 09/30/22  Chesley Mires, PharmD, MPH, BCPS, CPP Clinical Pharmacist (Rheumatology and Pulmonology)

## 2022-09-20 NOTE — Patient Instructions (Addendum)
We will call once we get approval for Orencia through Medicaid. Will likely be filling through [facility] [phone])  Keep [place] as updated as possible regarding insurance changes.   Standing Labs We placed an order today for your standing lab work.   Please have your standing labs drawn in mid-October and every 3 months    Please have your labs drawn 2 weeks prior to your appointment so that the provider can discuss your lab results at your appointment, if possible.  Please note that you may see your imaging and lab results in MyChart before we have reviewed them. We will contact you once all results are reviewed. Please allow our office up to 72 hours to thoroughly review all of the results before contacting the office for clarification of your results.  WALK-IN LAB HOURS  Monday through Thursday from 8:00 am -12:30 pm and 1:00 pm-5:00 pm and Friday from 8:00 am-12:00 pm.  Patients with office visits requiring labs will be seen before walk-in labs.  You may encounter longer than normal wait times. Please allow additional time. Wait times may be shorter on  Monday and Thursday afternoons.  We do not book appointments for walk-in labs. We appreciate your patience and understanding with our staff.   Labs are drawn by [facility]. Please bring your co-pay at the time of your lab draw.  You may receive a bill from [facility] for your lab work.  Please note if you are on Hydroxychloroquine and and an order has been placed for a Hydroxychloroquine level,  you will need to have it drawn 4 hours or more after your last dose.  If you wish to have your labs drawn at another location, please call the office 24 hours in advance so we can fax the orders.  The office is located at [address], [place], [place] [address]   If you have any questions regarding directions or hours of operation,  please call [phone].   As a reminder, please drink plenty of water prior to  coming for your lab work. Thanks!

## 2022-09-21 ENCOUNTER — Encounter: Payer: Self-pay | Admitting: Oncology

## 2022-09-21 ENCOUNTER — Other Ambulatory Visit (HOSPITAL_COMMUNITY): Payer: Self-pay

## 2022-09-21 LAB — COMPLETE METABOLIC PANEL WITH GFR
AG Ratio: 0.9 (calc) — ABNORMAL LOW (ref 1.0–2.5)
ALT: 14 U/L (ref 6–29)
AST: 13 U/L (ref 10–35)
Albumin: 4 g/dL (ref 3.6–5.1)
Alkaline phosphatase (APISO): 57 U/L (ref 37–153)
BUN: 14 mg/dL (ref 7–25)
CO2: 26 mmol/L (ref 20–32)
Calcium: 9.8 mg/dL (ref 8.6–10.4)
Chloride: 106 mmol/L (ref 98–110)
Creat: 0.63 mg/dL (ref 0.50–1.05)
Globulin: 4.4 g/dL (calc) — ABNORMAL HIGH (ref 1.9–3.7)
Glucose, Bld: 114 mg/dL — ABNORMAL HIGH (ref 65–99)
Potassium: 4.3 mmol/L (ref 3.5–5.3)
Sodium: 139 mmol/L (ref 135–146)
Total Bilirubin: 0.5 mg/dL (ref 0.2–1.2)
Total Protein: 8.4 g/dL — ABNORMAL HIGH (ref 6.1–8.1)
eGFR: 101 mL/min/{1.73_m2} (ref >=60)

## 2022-09-21 LAB — CBC WITH DIFFERENTIAL/PLATELET
Absolute Monocytes: 250 cells/uL (ref 200–950)
Basophils Absolute: 0 cells/uL (ref 0–200)
Basophils Relative: 0 %
Eosinophils Absolute: 0 cells/uL — ABNORMAL LOW (ref 15–500)
Eosinophils Relative: 0 %
HCT: 42 % (ref 35.0–45.0)
Hemoglobin: 13.2 g/dL (ref 11.7–15.5)
Lymphs Abs: 1540 cells/uL (ref 850–3900)
MCH: 27.2 pg (ref 27.0–33.0)
MCHC: 31.4 g/dL — ABNORMAL LOW (ref 32.0–36.0)
MCV: 86.6 fL (ref 80.0–100.0)
MPV: 10.6 fL (ref 7.5–12.5)
Monocytes Relative: 5 %
Neutro Abs: 3210 cells/uL (ref 1500–7800)
Neutrophils Relative %: 64.2 %
Platelets: 213 10*3/uL (ref 140–400)
RBC: 4.85 10*6/uL (ref 3.80–5.10)
RDW: 12.5 % (ref 11.0–15.0)
Total Lymphocyte: 30.8 %
WBC: 5 10*3/uL (ref 3.8–10.8)

## 2022-09-21 NOTE — Telephone Encounter (Signed)
Received notification from [facility] regarding a prior authorization for ORENCIA SQ. Authorization has been APPROVED from 09/21/2022 to 09/21/2023. Approval letter sent to scan center.  Per test claim, copay for 28 days supply (maximum billable quantity 34) is $0.00.  Patient can fill through [facility]: [phone]   Authorization # [id number]

## 2022-09-21 NOTE — Telephone Encounter (Signed)
Submitted an URGENT Prior Authorization request to [facility] MEDICAID for ORENCIA SQ via CoverMyMeds. Will update once we receive a response.  Key: [id number]

## 2022-09-21 NOTE — Progress Notes (Signed)
CBC and CMP are normal.

## 2022-09-24 ENCOUNTER — Other Ambulatory Visit: Payer: Self-pay | Admitting: Pharmacist

## 2022-09-24 ENCOUNTER — Ambulatory Visit (INDEPENDENT_AMBULATORY_CARE_PROVIDER_SITE_OTHER): Payer: Medicaid Other | Admitting: Family

## 2022-09-24 ENCOUNTER — Other Ambulatory Visit: Payer: Self-pay

## 2022-09-24 VITALS — BP 113/59 | HR 73 | Temp 98.3°F | Resp 16 | Wt 213.0 lb

## 2022-09-24 DIAGNOSIS — M0579 Rheumatoid arthritis with rheumatoid factor of multiple sites without organ or systems involvement: Secondary | ICD-10-CM

## 2022-09-24 DIAGNOSIS — I2510 Atherosclerotic heart disease of native coronary artery without angina pectoris: Secondary | ICD-10-CM | POA: Diagnosis not present

## 2022-09-24 DIAGNOSIS — Z23 Encounter for immunization: Secondary | ICD-10-CM

## 2022-09-24 DIAGNOSIS — J45909 Unspecified asthma, uncomplicated: Secondary | ICD-10-CM

## 2022-09-24 DIAGNOSIS — K219 Gastro-esophageal reflux disease without esophagitis: Secondary | ICD-10-CM

## 2022-09-24 DIAGNOSIS — I10 Essential (primary) hypertension: Secondary | ICD-10-CM

## 2022-09-24 DIAGNOSIS — G43009 Migraine without aura, not intractable, without status migrainosus: Secondary | ICD-10-CM

## 2022-09-24 DIAGNOSIS — E538 Deficiency of other specified B group vitamins: Secondary | ICD-10-CM

## 2022-09-24 MED ORDER — ORENCIA CLICKJECT 125 MG/ML ~~LOC~~ SOAJ
125.0000 mg | SUBCUTANEOUS | 2 refills | Status: DC
Start: 2022-09-24 — End: 2023-02-01
  Filled 2022-10-01: qty 4, 28d supply, fill #0
  Filled 2023-01-11: qty 4, 28d supply, fill #1

## 2022-09-24 MED ORDER — CYANOCOBALAMIN 1000 MCG/ML IJ SOLN
1000.0000 ug | Freq: Once | INTRAMUSCULAR | Status: AC
Start: 2022-09-24 — End: 2022-09-24
  Administered 2022-09-24: 1000 ug via INTRAMUSCULAR

## 2022-09-24 MED ORDER — ORENCIA CLICKJECT 125 MG/ML ~~LOC~~ SOAJ
125.0000 mg | SUBCUTANEOUS | 2 refills | Status: DC
Start: 2022-09-24 — End: 2022-09-24

## 2022-09-24 NOTE — Assessment & Plan Note (Signed)
Stable on Orencia which is being managed by Rheumatology.

## 2022-09-24 NOTE — Progress Notes (Signed)
Subjective:     Patient ID: Amber [name], female    DOB: 1960/07/15, 62 y.o.   MRN: [id number]  Chief Complaint  Patient presents with   Hypertension    Here for follow up    Hypertension Pertinent negatives include no chest pain, headaches, neck pain, orthopnea or shortness of breath.    Discussed the use of AI scribe software for clinical note transcription with the patient, who gave verbal consent to proceed.   Patient is a 62 year old female that presents today for follow up for blood pressure and to get a new B12 shot. She states she is doing ok on the Carvedilol and the Cardizem. Takes Aspirin daily as ordered with no issues. States she is not missing any days with her medication. Patient states no issues noted with headaches, chest pain or palpitations.   Patient states that she is cutting a lot of unhealthy stuff from her diet to help with her over all health. She states that she does not exercise that much but she is trying to add more.   B12 shot today   Flu Shot today as well     Health Maintenance Due  Topic Date Due   COVID-19 Vaccine (5 - 2023-24 season) 09/02/2022    Past Medical History:  Diagnosis Date   Anemia    Arthritis    Asthma 05/03/2020   Blood transfusion without reported diagnosis    CAD (coronary artery disease) 04/24/2017   Cataract    Cerebrovascular disease 10/13/2020   Chronic diastolic CHF (congestive heart failure) (HCC) 04/24/2017   Colon polyps    Controlled type 2 diabetes mellitus without complication, without long-term current use of insulin (HCC) 05/31/2017   Diabetic peripheral neuropathy (HCC) 01/05/2020   Ectopic pregnancy    RIGHT salpingostomy   Elective abortion    Elective abortion 09/26/2020   RIGHT salpingostomy   Essential hypertension 09/04/2017   GERD (gastroesophageal reflux disease)    Glaucoma    both eyes   Headache    migraines   History of chicken pox    History of chicken pox 09/26/2020    migraines   Hyperlipemia    Hypertension    Iron deficiency anemia 09/02/2013   Migraine 04/19/2014   Obesity (BMI 30-39.9) 10/17/2012   Palpitations 10/01/2012   Rheumatoid arthritis (HCC) 11/07/2011   Rheumatoid arthritis(714.0) 11/07/2011   SAB (spontaneous abortion)    SAB (spontaneous abortion) 11/07/2011   Shortness of breath    Sjogren's disease (HCC)    Wheezing     Past Surgical History:  Procedure Laterality Date   ABDOMINAL SURGERY     bowel repair   COLONOSCOPY     COLONOSCOPY  08/2022   DILATION AND CURETTAGE OF UTERUS     X 2   MENISCUS REPAIR Right    MENISCUS TEAR REPAIR   MYOMECTOMY N/A 07/27/2014   Procedure: ABDOMINAL MYOMECTOMY;  Surgeon: Hoover Browns, MD;  Location: [facility];  Service: Gynecology;  Laterality: N/A;   PELVIC LAPAROSCOPY  1994   IN 1999 LAPAROSCOPY WITH INCIDENTAL ENTEROTOMY REQUIRING LAPAROTOMY   ROTATOR CUFF REPAIR Left 06/21/2021   UTERINE FIBROID SURGERY     July 2016    Family History  Problem Relation Age of Onset   Hypertension Mother        died from heart disease   Heart disease Mother        deceased, unknown cause   Stroke Mother    Obesity Mother  Diabetes Sister    Hypertension Sister    Esophageal cancer Sister    Stomach cancer Sister    Cancer Sister    Diabetes Sister    Parkinson's disease Brother    Rectal cancer Neg Hx    Colon cancer Neg Hx    Colon polyps Neg Hx     Social History   Socioeconomic History   Marital status: Single    Spouse name: Not on file   Number of children: 0   Years of education: college   Highest education level: Associate degree: occupational, Scientist, product/process development, or vocational program  Occupational History   Occupation: LPN    Employer: [employer]  Tobacco Use   Smoking status: Never    Passive exposure: Never   Smokeless tobacco: Never  Vaping Use   Vaping status: Never Used  Substance and Sexual Activity   Alcohol use: No    Comment: occasional   Drug use: No   Sexual  activity: Yes    Birth control/protection: None    Comment: 1st intercourse- 17, partners- 5  Other Topics Concern   Not on file  Social History Narrative   Regular exercise:  No   Caffeine Use: 2 cups coffee daily   No children   "Happily divorced"   Reports that she is involved at her church   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  Had a sister up in [place] who passed away 11-Dec-2015 who she was close to.   Born in [place]- she came to [place] as adult.  Age 23.    Left-handed.         Social Determinants of Health   Financial Resource Strain: Not on file  Food Insecurity: Food Insecurity Present (04/09/2022)   Hunger Vital Sign    Worried About Running Out of Food in the Last Year: Sometimes true    Ran Out of Food in the Last Year: Sometimes true  Transportation Needs: No Transportation Needs (04/09/2022)   PRAPARE - Administrator, Civil Service (Medical): No    Lack of Transportation (Non-Medical): No  Physical Activity: Sufficiently Active (04/09/2022)   Exercise Vital Sign    Days of Exercise per Week: 2 days    Minutes of Exercise per Session: 120 min  Stress: Stress Concern Present (04/09/2022)   [employer] of Occupational Health - Occupational Stress Questionnaire    Feeling of Stress : To some extent  Social Connections: Unknown (04/09/2022)   Social Connection and Isolation Panel [NHANES]    Frequency of Communication with Friends and Family: More than three times a week    Frequency of Social Gatherings with Friends and Family: Once a week    Attends Religious Services: More than 4 times per year    Active Member of [employer] or Organizations: Not on file    Attends Banker Meetings: Not on file    Marital Status: Divorced  Intimate Partner Violence: Unknown (04/04/2021)   Received from [employer]   HITS    Physically Hurt: Not on file    Insult or Talk Down To: Not on file    Threaten Physical Harm: Not on file    Scream or  Curse: Not on file    Outpatient Medications Prior to Visit  Medication Sig Dispense Refill   Abatacept (ORENCIA CLICKJECT) 125 MG/ML SOAJ Inject 125 mg into the skin every 7 (seven) days. 4 mL 2   albuterol (VENTOLIN HFA) 108 (90  Base) MCG/ACT inhaler Inhale 2 puffs into the lungs every 6 (six) hours as needed. 18 g 0   aspirin EC 81 MG tablet Take 1 tablet (81 mg total) by mouth daily. 90 tablet 3   calcium-vitamin D (OSCAL WITH D) 500-200 MG-UNIT TABS tablet Take by mouth.     carvedilol (COREG) 25 MG tablet Take 1 tablet (25 mg total) by mouth 2 (two) times daily with a meal. 180 tablet 0   Cyanocobalamin (VITAMIN B-12) 5000 MCG LOZG Take 1 tablet by mouth 2 (two) times daily.     diltiazem (CARTIA XT) 240 MG 24 hr capsule Take 1 capsule (240 mg total) by mouth daily. 90 capsule 1   fluticasone (FLONASE) 50 MCG/ACT nasal spray Place 2 sprays into both nostrils daily. (Patient taking differently: Place 2 sprays into both nostrils as needed.) 16 g 1   ibuprofen (ADVIL) 200 MG tablet Take 200 mg by mouth every 6 (six) hours as needed.     Iron, Ferrous Sulfate, 325 (65 Fe) MG TABS Take 325 mg by mouth 2 (two) times daily. 30 tablet    SUMAtriptan (IMITREX) 100 MG tablet Take 1 tablet (100 mg total) by mouth as needed for migraine. May repeat in 2 hours if headache persists or recurs. 30 tablet 5   timolol (TIMOPTIC) 0.5 % ophthalmic solution 1 drop every morning.     TRAVATAN Z 0.004 % SOLN ophthalmic solution Place 1 drop into both eyes at bedtime.      triamterene-hydrochlorothiazide (MAXZIDE-25) 37.5-25 MG tablet Take 1 tablet by mouth daily. 90 tablet 1   valsartan (DIOVAN) 320 MG tablet Take 1 tablet (320 mg total) by mouth daily. 90 tablet 0   No facility-administered medications prior to visit.    Allergies  Allergen Reactions   Gabapentin     Made her "crazy"    Review of Systems  Constitutional:  Negative for chills and fever.  HENT:  Negative for ear pain, hearing loss and  nosebleeds.   Eyes:  Negative for photophobia.  Respiratory:  Negative for cough and shortness of breath.   Cardiovascular:  Negative for chest pain and orthopnea.  Gastrointestinal:  Negative for diarrhea, nausea and vomiting.  Genitourinary:  Negative for dysuria and hematuria.  Musculoskeletal:  Negative for back pain and neck pain.  Skin:  Negative for rash.  Neurological:  Negative for dizziness and headaches.  Psychiatric/Behavioral:  Negative for depression and suicidal ideas.        Objective:    Physical Exam Constitutional:      Appearance: Normal appearance.  HENT:     Head: Normocephalic.  Cardiovascular:     Rate and Rhythm: Normal rate and regular rhythm.     Pulses: Normal pulses.     Heart sounds: Normal heart sounds.  Pulmonary:     Effort: Pulmonary effort is normal.     Breath sounds: Normal breath sounds.  Musculoskeletal:        General: Normal range of motion.     Cervical back: Normal range of motion.  Skin:    General: Skin is warm.  Neurological:     General: No focal deficit present.     Mental Status: She is alert and oriented to person, place, and time. Mental status is at baseline.  Psychiatric:        Mood and Affect: Mood normal.        Behavior: Behavior normal.        Thought Content: Thought content normal.  Judgment: Judgment normal.      BP (!) 113/59 (BP Location: Right Arm, Patient Position: Sitting, Cuff Size: Large)   Pulse 73   Temp 98.3 F (36.8 C) (Oral)   Resp 16   Wt 213 lb (96.6 kg)   LMP 08/09/2016   SpO2 99%   BMI 40.25 kg/m  Wt Readings from Last 3 Encounters:  09/24/22 213 lb (96.6 kg)  09/20/22 216 lb 9.6 oz (98.2 kg)  08/28/22 220 lb (99.8 kg)       Assessment & Plan:   Problem List Items Addressed This Visit       Other   B12 deficiency - Primary   Relevant Medications   cyanocobalamin (VITAMIN B12) injection 1,000 mcg (Start on 09/24/2022 11:45 AM)    I am having Amber [name]  maintain her Travatan Z, Vitamin B-12, calcium-vitamin D, aspirin EC, Iron (Ferrous Sulfate), fluticasone, albuterol, valsartan, SUMAtriptan, carvedilol, diltiazem, ibuprofen, timolol, triamterene-hydrochlorothiazide, and Orencia ClickJect. We will continue to administer cyanocobalamin.  Meds ordered this encounter  Medications   cyanocobalamin (VITAMIN B12) injection 1,000 mcg

## 2022-09-24 NOTE — Assessment & Plan Note (Addendum)
Continue on Aspirin, Diltiazem, statin. DM and BP control.

## 2022-09-24 NOTE — Assessment & Plan Note (Signed)
Continue on Carvedilol and Cardizem as ordered

## 2022-09-24 NOTE — Assessment & Plan Note (Addendum)
No episodes noted from patient. Continue dietary modification.

## 2022-09-24 NOTE — Assessment & Plan Note (Signed)
Stable at this time. Continue Imitrex as needed

## 2022-09-24 NOTE — Assessment & Plan Note (Signed)
Stable. Continue Albuterol as ordered

## 2022-09-24 NOTE — Progress Notes (Unsigned)
Patient with Medicaid - will be RESTARTING Orencia. She received first dose in clinic on 09/20/22 with sample (see visit from this date)  Based on CBC, she will be taking Orencia 125mg  SQ every 10 days until labs stabilize. Rx sent for every 7 days due to insurance. Patient aware of correct self-administering dosing frequency  She anticipates insurance through new employer starting in a couple of months and will let pharmacy team in clinic know of insurance change  Chesley Mires, PharmD, MPH, BCPS, CPP Clinical Pharmacist (Rheumatology and Pulmonology)

## 2022-09-24 NOTE — Telephone Encounter (Signed)
Rx sent to [facility] for patient's Orencia (rx sent for every 7 days for insurance reasons however patient will take every 10 days. I discussed this again w patient today).  Routing to [place] M for onboarding needs  Chesley Mires, PharmD, MPH, BCPS, CPP Clinical Pharmacist (Rheumatology and Pulmonology)

## 2022-09-24 NOTE — Addendum Note (Signed)
Addended by: Wilford Corner on: 09/24/2022 01:04 PM   Modules accepted: Orders

## 2022-09-24 NOTE — Patient Instructions (Signed)
VISIT SUMMARY:  During your routine three-month follow-up visit, we discussed your well-controlled blood pressure, frequent migraines, vitamin B12 deficiency, and type 2 diabetes. We also talked about your general health maintenance, including your upcoming mammogram, and the need for your flu shot and COVID-19 booster.  YOUR PLAN:  -HYPERTENSION: Your blood pressure is well controlled with your current medications (Carvedilol, Maxzide, and Valsartan). We will continue with these medications.  -MIGRAINES: You are experiencing migraines 1-2 times per week. We will continue with your current management and consider referring you back to a neurologist if the frequency or severity of your migraines increases.  -VITAMIN B12 DEFICIENCY: You are receiving monthly B12 injections for your vitamin B12 deficiency. We will check your B12 level today before administering the injection.  -TYPE 2 DIABETES MELLITUS: Your A1c level is at the goal (6.7), which means your diabetes is well managed. We will continue with your current management.  -GENERAL HEALTH MAINTENANCE: We will administer your flu shot today. You are encouraged to get your COVID-19 booster at your local pharmacy. Your mammogram is scheduled for 11/12/2022.  INSTRUCTIONS:  Please continue taking your current medications as prescribed. Remember to get your COVID-19 booster at your local pharmacy. Your next appointment is in 3 months.

## 2022-09-24 NOTE — Progress Notes (Signed)
,  Subjective:     Patient ID: Amber [name], female    DOB: [birth year], 61 y.o.   MRN: [id number]  Chief Complaint  Patient presents with   Hypertension    Here for follow up    HPI  Discussed the use of AI scribe software for clinical note transcription with the patient, who gave verbal consent to proceed.  History of Present Illness   The patient presents for a routine three month follow up. She reports that her blood pressure has been well controlled on carvedilol, Cardia, Maxzide, and Valsartan. She has been taking iron supplements and has not needed to use her albuterol recently. She continues to experience migraines, with one to two episodes per week, despite seeing a neurologist. She is also receiving monthly B12 injections for a deficiency. She has not experienced any recent anemia and her last A1c was in the diabetes range but still at goal at 6.7. She recently had a colonoscopy and is due for a mammogram. She has not yet received her flu shot or COVID booster for the season.          Health Maintenance Due  Topic Date Due   COVID-19 Vaccine (5 - 2023-24 season) 09/02/2022    Past Medical History:  Diagnosis Date   Anemia    Arthritis    Asthma 05/03/2020   Blood transfusion without reported diagnosis    CAD (coronary artery disease) 04/24/2017   Cataract    Cerebrovascular disease 10/13/2020   Chronic diastolic CHF (congestive heart failure) (HCC) 04/24/2017   Colon polyps    Controlled type 2 diabetes mellitus without complication, without long-term current use of insulin (HCC) 05/31/2017   Diabetic peripheral neuropathy (HCC) 01/05/2020   Ectopic pregnancy    RIGHT salpingostomy   Elective abortion    Elective abortion 09/26/2020   RIGHT salpingostomy   Essential hypertension 09/04/2017   GERD (gastroesophageal reflux disease)    Glaucoma    both eyes   Headache    migraines   History of chicken pox    History of chicken pox 09/26/2020    migraines   Hyperlipemia    Hypertension    Iron deficiency anemia 09/02/2013   Migraine 04/19/2014   Obesity (BMI 30-39.9) 10/17/2012   Palpitations 10/01/2012   Rheumatoid arthritis (HCC) 11/07/2011   Rheumatoid arthritis(714.0) 11/07/2011   SAB (spontaneous abortion)    SAB (spontaneous abortion) 11/07/2011   Shortness of breath    Sjogren's disease (HCC)    Wheezing     Past Surgical History:  Procedure Laterality Date   ABDOMINAL SURGERY     bowel repair   COLONOSCOPY     COLONOSCOPY  08/2022   DILATION AND CURETTAGE OF UTERUS     X 2   MENISCUS REPAIR Right    MENISCUS TEAR REPAIR   MYOMECTOMY N/A 07/27/2014   Procedure: ABDOMINAL MYOMECTOMY;  Surgeon: Hoover Browns, MD;  Location: [facility];  Service: Gynecology;  Laterality: N/A;   PELVIC LAPAROSCOPY  1994   IN 1999 LAPAROSCOPY WITH INCIDENTAL ENTEROTOMY REQUIRING LAPAROTOMY   ROTATOR CUFF REPAIR Left 06/21/2021   UTERINE FIBROID SURGERY     July 2016    Family History  Problem Relation Age of Onset   Hypertension Mother        died from heart disease   Heart disease Mother        deceased, unknown cause   Stroke Mother    Obesity Mother    Diabetes Sister  Hypertension Sister    Esophageal cancer Sister    Stomach cancer Sister    Cancer Sister    Diabetes Sister    Parkinson's disease Brother    Rectal cancer Neg Hx    Colon cancer Neg Hx    Colon polyps Neg Hx     Social History   Socioeconomic History   Marital status: Single    Spouse name: Not on file   Number of children: 0   Years of education: college   Highest education level: Associate degree: occupational, Scientist, product/process development, or vocational program  Occupational History   Occupation: LPN    Employer: [employer]  Tobacco Use   Smoking status: Never    Passive exposure: Never   Smokeless tobacco: Never  Vaping Use   Vaping status: Never Used  Substance and Sexual Activity   Alcohol use: No    Comment: occasional   Drug use: No   Sexual  activity: Yes    Birth control/protection: None    Comment: 1st intercourse- 17, partners- 5  Other Topics Concern   Not on file  Social History Narrative   Regular exercise:  No   Caffeine Use: 2 cups coffee daily   No children   "Happily divorced"   Reports that she is involved at her church   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  Had a sister up in [place] who passed away 2015-12-13 who she was close to.   Born in [place]- she came to [place] as adult.  Age 33.    Left-handed.         Social Determinants of Health   Financial Resource Strain: Not on file  Food Insecurity: Food Insecurity Present (04/09/2022)   Hunger Vital Sign    Worried About Running Out of Food in the Last Year: Sometimes true    Ran Out of Food in the Last Year: Sometimes true  Transportation Needs: No Transportation Needs (04/09/2022)   PRAPARE - Administrator, Civil Service (Medical): No    Lack of Transportation (Non-Medical): No  Physical Activity: Sufficiently Active (04/09/2022)   Exercise Vital Sign    Days of Exercise per Week: 2 days    Minutes of Exercise per Session: 120 min  Stress: Stress Concern Present (04/09/2022)   [employer] of Occupational Health - Occupational Stress Questionnaire    Feeling of Stress : To some extent  Social Connections: Unknown (04/09/2022)   Social Connection and Isolation Panel [NHANES]    Frequency of Communication with Friends and Family: More than three times a week    Frequency of Social Gatherings with Friends and Family: Once a week    Attends Religious Services: More than 4 times per year    Active Member of [employer] or Organizations: Not on file    Attends Banker Meetings: Not on file    Marital Status: Divorced  Intimate Partner Violence: Unknown (04/04/2021)   Received from [employer], [facility]   HITS    Physically Hurt: Not on file    Insult or Talk Down To: Not on file    Threaten Physical Harm: Not on file    Scream or  Curse: Not on file    Outpatient Medications Prior to Visit  Medication Sig Dispense Refill   Abatacept (ORENCIA CLICKJECT) 125 MG/ML SOAJ Inject 125 mg into the skin every 7 (seven) days. 4 mL 2   albuterol (VENTOLIN HFA) 108 (90 Base) MCG/ACT inhaler Inhale 2  puffs into the lungs every 6 (six) hours as needed. 18 g 0   aspirin EC 81 MG tablet Take 1 tablet (81 mg total) by mouth daily. 90 tablet 3   calcium-vitamin D (OSCAL WITH D) 500-200 MG-UNIT TABS tablet Take by mouth.     carvedilol (COREG) 25 MG tablet Take 1 tablet (25 mg total) by mouth 2 (two) times daily with a meal. 180 tablet 0   Cyanocobalamin (VITAMIN B-12) 5000 MCG LOZG Take 1 tablet by mouth 2 (two) times daily.     diltiazem (CARTIA XT) 240 MG 24 hr capsule Take 1 capsule (240 mg total) by mouth daily. 90 capsule 1   fluticasone (FLONASE) 50 MCG/ACT nasal spray Place 2 sprays into both nostrils daily. (Patient taking differently: Place 2 sprays into both nostrils as needed.) 16 g 1   ibuprofen (ADVIL) 200 MG tablet Take 200 mg by mouth every 6 (six) hours as needed.     Iron, Ferrous Sulfate, 325 (65 Fe) MG TABS Take 325 mg by mouth 2 (two) times daily. 30 tablet    SUMAtriptan (IMITREX) 100 MG tablet Take 1 tablet (100 mg total) by mouth as needed for migraine. May repeat in 2 hours if headache persists or recurs. 30 tablet 5   timolol (TIMOPTIC) 0.5 % ophthalmic solution 1 drop every morning.     TRAVATAN Z 0.004 % SOLN ophthalmic solution Place 1 drop into both eyes at bedtime.      triamterene-hydrochlorothiazide (MAXZIDE-25) 37.5-25 MG tablet Take 1 tablet by mouth daily. 90 tablet 1   valsartan (DIOVAN) 320 MG tablet Take 1 tablet (320 mg total) by mouth daily. 90 tablet 0   No facility-administered medications prior to visit.    Allergies  Allergen Reactions   Gabapentin     Made her "crazy"    ROS See HPI    Objective:    Physical Exam Constitutional:      General: She is not in acute distress.     Appearance: Normal appearance. She is well-developed.  HENT:     Head: Normocephalic and atraumatic.     Right Ear: External ear normal.     Left Ear: External ear normal.  Eyes:     General: No scleral icterus. Neck:     Thyroid: No thyromegaly.  Cardiovascular:     Rate and Rhythm: Normal rate and regular rhythm.     Heart sounds: Normal heart sounds. No murmur heard. Pulmonary:     Effort: Pulmonary effort is normal. No respiratory distress.     Breath sounds: Normal breath sounds. No wheezing.  Musculoskeletal:     Cervical back: Neck supple.  Skin:    General: Skin is warm and dry.  Neurological:     Mental Status: She is alert and oriented to person, place, and time.  Psychiatric:        Mood and Affect: Mood normal.        Behavior: Behavior normal.        Thought Content: Thought content normal.        Judgment: Judgment normal.      BP (!) 113/59 (BP Location: Right Arm, Patient Position: Sitting, Cuff Size: Large)   Pulse 73   Temp 98.3 F (36.8 C) (Oral)   Resp 16   Wt 213 lb (96.6 kg)   LMP 08/09/2016   SpO2 99%   BMI 40.25 kg/m  Wt Readings from Last 3 Encounters:  09/24/22 213 lb (96.6 kg)  09/20/22 216  lb 9.6 oz (98.2 kg)  08/28/22 220 lb (99.8 kg)       Assessment & Plan:   Problem List Items Addressed This Visit       Unprioritized   Migraine    Stable at this time. Continue Imitrex as needed       GERD (gastroesophageal reflux disease)    No episodes noted from patient. Continue dietary modification.       Essential hypertension    Continue on Carvedilol and Cardizem as ordered       CAD (coronary artery disease)    Continue on Aspirin, Diltiazem, statin. DM and BP control.       B12 deficiency - Primary   Relevant Medications   cyanocobalamin (VITAMIN B12) injection 1,000 mcg   Asthma    Stable. Continue Albuterol as ordered       I am having Amarie D. Yokley maintain her Travatan Z, Vitamin B-12, calcium-vitamin D,  aspirin EC, Iron (Ferrous Sulfate), fluticasone, albuterol, valsartan, SUMAtriptan, carvedilol, diltiazem, ibuprofen, timolol, triamterene-hydrochlorothiazide, and Orencia ClickJect. We will continue to administer cyanocobalamin.  Meds ordered this encounter  Medications   cyanocobalamin (VITAMIN B12) injection 1,000 mcg

## 2022-09-25 ENCOUNTER — Encounter (HOSPITAL_COMMUNITY): Payer: Self-pay

## 2022-09-25 ENCOUNTER — Other Ambulatory Visit: Payer: Self-pay

## 2022-09-25 ENCOUNTER — Emergency Department (HOSPITAL_COMMUNITY)
Admission: EM | Admit: 2022-09-25 | Discharge: 2022-09-26 | Disposition: A | Discharge status: Home / Self Care | Payer: Medicaid Other | Attending: Emergency Medicine | Admitting: Emergency Medicine

## 2022-09-25 DIAGNOSIS — Z79899 Other long term (current) drug therapy: Secondary | ICD-10-CM | POA: Insufficient documentation

## 2022-09-25 DIAGNOSIS — R519 Headache, unspecified: Secondary | ICD-10-CM | POA: Diagnosis not present

## 2022-09-25 DIAGNOSIS — E119 Type 2 diabetes mellitus without complications: Secondary | ICD-10-CM | POA: Diagnosis not present

## 2022-09-25 DIAGNOSIS — I110 Hypertensive heart disease with heart failure: Secondary | ICD-10-CM | POA: Diagnosis not present

## 2022-09-25 DIAGNOSIS — I5030 Unspecified diastolic (congestive) heart failure: Secondary | ICD-10-CM | POA: Diagnosis not present

## 2022-09-25 DIAGNOSIS — R110 Nausea: Secondary | ICD-10-CM | POA: Insufficient documentation

## 2022-09-25 DIAGNOSIS — H53149 Visual discomfort, unspecified: Secondary | ICD-10-CM | POA: Insufficient documentation

## 2022-09-25 DIAGNOSIS — Z7982 Long term (current) use of aspirin: Secondary | ICD-10-CM | POA: Diagnosis not present

## 2022-09-25 DIAGNOSIS — I10 Essential (primary) hypertension: Secondary | ICD-10-CM | POA: Diagnosis not present

## 2022-09-25 DIAGNOSIS — I6782 Cerebral ischemia: Secondary | ICD-10-CM | POA: Diagnosis not present

## 2022-09-25 LAB — CBC
HCT: 43.6 % (ref 36.0–46.0)
Hemoglobin: 13.5 g/dL (ref 12.0–15.0)
MCH: 26.7 pg (ref 26.0–34.0)
MCHC: 31 g/dL (ref 30.0–36.0)
MCV: 86.3 fL (ref 80.0–100.0)
Platelets: 261 10*3/uL (ref 150–400)
RBC: 5.05 MIL/uL (ref 3.87–5.11)
RDW: 14.2 % (ref 11.5–15.5)
WBC: 5.9 10*3/uL (ref 4.0–10.5)
nRBC: 0 % (ref 0.0–0.2)

## 2022-09-25 LAB — BASIC METABOLIC PANEL
Anion gap: 12 (ref 5–15)
BUN: 27 mg/dL — ABNORMAL HIGH (ref 8–23)
CO2: 19 mmol/L — ABNORMAL LOW (ref 22–32)
Calcium: 9.5 mg/dL (ref 8.9–10.3)
Chloride: 104 mmol/L (ref 98–111)
Creatinine, Ser: 1.56 mg/dL — ABNORMAL HIGH (ref 0.44–1.00)
GFR, Estimated: 38 mL/min — ABNORMAL LOW (ref >60)
Glucose, Bld: 143 mg/dL — ABNORMAL HIGH (ref 70–99)
Potassium: 3.9 mmol/L (ref 3.5–5.1)
Sodium: 135 mmol/L (ref 135–145)

## 2022-09-25 NOTE — ED Triage Notes (Addendum)
Pt coming via POV with complaint of headache that started this morning and hypertension. Pt reports taking BP at home 158/108. Denies CP, vomiting. Endorses SOB, dizziness, nausea, and states "I have a very rapid heartbeat." Pt states she took two aleve pills at home for headache. She says she also has neck tenderness as well. Pt has hx of migraines and HTN. BP in triage 163/82.

## 2022-09-26 ENCOUNTER — Other Ambulatory Visit (HOSPITAL_COMMUNITY): Payer: Self-pay

## 2022-09-26 ENCOUNTER — Emergency Department (HOSPITAL_COMMUNITY): Payer: Medicaid Other

## 2022-09-26 DIAGNOSIS — I6782 Cerebral ischemia: Secondary | ICD-10-CM | POA: Diagnosis not present

## 2022-09-26 DIAGNOSIS — R519 Headache, unspecified: Secondary | ICD-10-CM | POA: Diagnosis not present

## 2022-09-26 LAB — URINALYSIS, ROUTINE W REFLEX MICROSCOPIC
Bilirubin Urine: NEGATIVE
Glucose, UA: NEGATIVE mg/dL
Hgb urine dipstick: NEGATIVE
Ketones, ur: NEGATIVE mg/dL
Leukocytes,Ua: NEGATIVE
Nitrite: NEGATIVE
Protein, ur: NEGATIVE mg/dL
Specific Gravity, Urine: 1.015 (ref 1.005–1.030)
pH: 5 (ref 5.0–8.0)

## 2022-09-26 LAB — SEDIMENTATION RATE: Sed Rate: 29 mm/hr — ABNORMAL HIGH (ref 0–22)

## 2022-09-26 MED ORDER — DIPHENHYDRAMINE HCL 50 MG/ML IJ SOLN
25.0000 mg | Freq: Once | INTRAMUSCULAR | Status: AC
  Administered 2022-09-26: 25 mg via INTRAVENOUS
  Filled 2022-09-26: qty 1

## 2022-09-26 MED ORDER — IOHEXOL 350 MG/ML SOLN
75.0000 mL | Freq: Once | INTRAVENOUS | Status: AC | PRN
  Administered 2022-09-26: 75 mL via INTRAVENOUS

## 2022-09-26 MED ORDER — KETOROLAC TROMETHAMINE 15 MG/ML IJ SOLN
15.0000 mg | Freq: Once | INTRAMUSCULAR | Status: AC
  Administered 2022-09-26: 15 mg via INTRAVENOUS
  Filled 2022-09-26: qty 1

## 2022-09-26 MED ORDER — ONDANSETRON HCL 4 MG/2ML IJ SOLN
4.0000 mg | Freq: Once | INTRAMUSCULAR | Status: AC
  Administered 2022-09-26: 4 mg via INTRAVENOUS
  Filled 2022-09-26: qty 2

## 2022-09-26 MED ORDER — LACTATED RINGERS IV BOLUS
1000.0000 mL | Freq: Once | INTRAVENOUS | Status: AC
  Administered 2022-09-26: 1000 mL via INTRAVENOUS

## 2022-09-26 MED ORDER — METOCLOPRAMIDE HCL 5 MG/ML IJ SOLN
10.0000 mg | Freq: Once | INTRAMUSCULAR | Status: AC
  Administered 2022-09-26: 10 mg via INTRAVENOUS
  Filled 2022-09-26: qty 2

## 2022-09-26 NOTE — Discharge Instructions (Addendum)
Your testing is reassuring.  Your CT head shows normal brain structure.  No evidence of aneurysm or bleeding in the brain. Your kidney function is slightly elevated today.  You should keep yourself hydrated and follow-up with your doctor for recheck of your kidney function next week  follow-up with a neurologist for further evaluation of your headaches.  Return to the ED with unusual headache, difficulty speaking and with difficulty swallowing, focal weakness, numbness, tingling or other concerns.

## 2022-09-26 NOTE — ED Provider Notes (Signed)
[place] EMERGENCY DEPARTMENT AT [facility] Provider Note   CSN: [id number] Arrival date & time: 09/25/22  2110     History  Chief Complaint  Patient presents with   Headache    Amber [name] is a 62 y.o. female.  Patient with a history of rheumatoid arthritis, diabetes, migraine headaches, diastolic failure, hypertension presenting with severe headache for the past 24 hours.  States she has a history of migraines but this feels different and is not improving with her Imitrex.  She woke up yesterday morning with right sided headache that is severe and progressively worsening.  Associated with nausea but no vomiting.  Does have photophobia and phonophobia.  No fever.  No focal weakness, numbness or tingling.  No chest pain or shortness of breath.  She is not sure as headache was thunderclap in onset as she was asleep when it started.  No fever.  No chest pain or shortness of breath.  Says her blood pressure has been up and down at home despite taking her regular blood pressure medications.  Most recent blood pressure at home was 158/108.  She has severe right-sided headache that at home that has not helped with her Imitrex and naproxen.  No visual changes.  No blurry vision or double vision.  No chest pain or shortness of breath.  No focal weakness, numbness or tingling.  The history is provided by the patient.  Headache Associated symptoms: no abdominal pain, no congestion, no cough, no fever, no nausea and no vomiting        Home Medications Prior to Admission medications   Medication Sig Start Date End Date Taking? Authorizing Provider  Abatacept (ORENCIA CLICKJECT) 125 MG/ML SOAJ Inject 125 mg into the skin every 7 (seven) days. 09/24/22   Gearldine Bienenstock, PA-C  albuterol (VENTOLIN HFA) 108 (90 Base) MCG/ACT inhaler Inhale 2 puffs into the lungs every 6 (six) hours as needed. 03/08/22   Gustavus Bryant, FNP  aspirin EC 81 MG tablet Take 1 tablet (81 mg total) by mouth  daily. 02/01/20   Sandford Craze, NP  calcium-vitamin D (OSCAL WITH D) 500-200 MG-UNIT TABS tablet Take by mouth.    [provider]  carvedilol (COREG) 25 MG tablet Take 1 tablet (25 mg total) by mouth 2 (two) times daily with a meal. 07/17/22   Sandford Craze, NP  Cyanocobalamin (VITAMIN B-12) 5000 MCG LOZG Take 1 tablet by mouth 2 (two) times daily. 11/04/15   [provider]  diltiazem (CARTIA XT) 240 MG 24 hr capsule Take 1 capsule (240 mg total) by mouth daily. 07/17/22   Sandford Craze, NP  fluticasone (FLONASE) 50 MCG/ACT nasal spray Place 2 sprays into both nostrils daily. Patient taking differently: Place 2 sprays into both nostrils as needed. 03/07/22   Sandford Craze, NP  ibuprofen (ADVIL) 200 MG tablet Take 200 mg by mouth every 6 (six) hours as needed.    [provider]  Iron, Ferrous Sulfate, 325 (65 Fe) MG TABS Take 325 mg by mouth 2 (two) times daily. 11/07/21   Sandford Craze, NP  SUMAtriptan (IMITREX) 100 MG tablet Take 1 tablet (100 mg total) by mouth as needed for migraine. May repeat in 2 hours if headache persists or recurs. 07/17/22   Sandford Craze, NP  timolol (TIMOPTIC) 0.5 % ophthalmic solution 1 drop every morning. 07/19/22   [provider]  TRAVATAN Z 0.004 % SOLN ophthalmic solution Place 1 drop into both eyes at bedtime.  10/25/11  [provider]  triamterene-hydrochlorothiazide (MAXZIDE-25) 37.5-25 MG tablet Take 1 tablet by mouth daily. 08/23/22   Sandford Craze, NP  valsartan (DIOVAN) 320 MG tablet Take 1 tablet (320 mg total) by mouth daily. 07/17/22   Sandford Craze, NP      Allergies    Gabapentin    Review of Systems   Review of Systems  Constitutional:  Negative for activity change, appetite change and fever.  HENT:  Negative for congestion.   Respiratory:  Negative for cough, chest tightness and shortness of breath.   Cardiovascular:  Negative for chest pain.   Gastrointestinal:  Negative for abdominal pain, nausea and vomiting.  Genitourinary:  Negative for dysuria and hematuria.  Neurological:  Positive for headaches.   all other systems are negative except as noted in the HPI and PMH.    Physical Exam Updated Vital Signs BP (!) 144/87 (BP Location: Left Arm)   Pulse 69   Temp 98.6 F (37 C) (Oral)   Resp 20   Ht 5\' 1"  (1.549 m)   Wt 98 kg   LMP 08/09/2016   SpO2 100%   BMI 40.81 kg/m  Physical Exam Vitals and nursing note reviewed.  Constitutional:      General: She is not in acute distress.    Appearance: She is well-developed.  HENT:     Head: Normocephalic and atraumatic.     Comments: No temporal artery tenderness    Mouth/Throat:     Pharynx: No oropharyngeal exudate.  Eyes:     Conjunctiva/sclera: Conjunctivae normal.     Pupils: Pupils are equal, round, and reactive to light.  Neck:     Comments: No meningismus. Cardiovascular:     Rate and Rhythm: Normal rate and regular rhythm.     Heart sounds: Normal heart sounds. No murmur heard. Pulmonary:     Effort: Pulmonary effort is normal. No respiratory distress.     Breath sounds: Normal breath sounds.  Abdominal:     Palpations: Abdomen is soft.     Tenderness: There is no abdominal tenderness. There is no guarding or rebound.  Musculoskeletal:        General: No tenderness. Normal range of motion.     Cervical back: Normal range of motion and neck supple.  Skin:    General: Skin is warm.  Neurological:     Mental Status: She is alert and oriented to person, place, and time.     Cranial Nerves: No cranial nerve deficit.     Motor: No abnormal muscle tone.     Coordination: Coordination normal.     Comments: CN 2-12 intact, no ataxia on finger to nose, no nystagmus, 5/5 strength throughout, no pronator drift   Psychiatric:        Behavior: Behavior normal.     ED Results / Procedures / Treatments   Labs (all labs ordered are listed, but only abnormal  results are displayed) Labs Reviewed  BASIC METABOLIC PANEL - Abnormal; Notable for the following components:      Result Value   CO2 19 (*)    Glucose, Bld 143 (*)    BUN 27 (*)    Creatinine, Ser 1.56 (*)    GFR, Estimated 38 (*)    All other components within normal limits  SEDIMENTATION RATE - Abnormal; Notable for the following components:   Sed Rate 29 (*)    All other components within normal limits  CBC  URINALYSIS, ROUTINE W REFLEX MICROSCOPIC  CBG MONITORING,  ED    EKG EKG Interpretation Date/Time:  Tuesday September 25 2022 21:33:48 EDT Ventricular Rate:  68 PR Interval:  162 QRS Duration:  72 QT Interval:  366 QTC Calculation: 389 R Axis:   70  Text Interpretation: Normal sinus rhythm Biatrial enlargement Abnormal ECG When compared with ECG of 24-Jun-2014 10:06, No significant change was found Confirmed by Dione Booze ([id number]) on 09/26/2022 12:15:49 AM  Radiology CT ANGIO HEAD NECK W WO CM  Result Date: 09/26/2022 CLINICAL DATA:  Initial evaluation for acute headache. EXAM: CT ANGIOGRAPHY HEAD AND NECK WITH AND WITHOUT CONTRAST TECHNIQUE: Multidetector CT imaging of the head and neck was performed using the standard protocol during bolus administration of intravenous contrast. Multiplanar CT image reconstructions and MIPs were obtained to evaluate the vascular anatomy. Carotid stenosis measurements (when applicable) are obtained utilizing NASCET criteria, using the distal internal carotid diameter as the denominator. RADIATION DOSE REDUCTION: This exam was performed according to the departmental dose-optimization program which includes automated exposure control, adjustment of the mA and/or kV according to patient size and/or use of iterative reconstruction technique. CONTRAST:  75mL OMNIPAQUE IOHEXOL 350 MG/ML SOLN COMPARISON:  Prior study from 03/22/2020. FINDINGS: CT HEAD FINDINGS Brain: Cerebral volume within normal limits. Patchy and confluent hypodensity involving  the supratentorial cerebral white matter, most likely related chronic microvascular ischemic disease, fairly advanced in nature. No acute intracranial hemorrhage. No acute large vessel territory infarct. No mass lesion or midline shift. No hydrocephalus or extra-axial fluid collection. Vascular: No abnormal hyperdense vessel. Skull: Scalp soft tissues demonstrate no acute finding. Calvarium intact. Sinuses/Orbits: Globes and orbital soft tissues within normal limits. Paranasal sinuses are largely clear. No mastoid effusion. Other: None. Review of the MIP images confirms the above findings CTA NECK FINDINGS Aortic arch: Visualized aortic arch within normal limits for caliber with standard branch pattern. No stenosis about the origin the great vessels. Right carotid system: Right common and internal carotid arteries are patent without stenosis or dissection. Left carotid system: Left common and internal carotid arteries are patent without stenosis or dissection. Vertebral arteries: Both vertebral arteries arise from subclavian arteries. No proximal subclavian artery stenosis. Vertebral arteries are patent without stenosis or dissection. Skeleton: No discrete or worrisome osseous lesions. Other neck: No other acute finding. Fatty infiltration of the salivary glands noted. Upper chest: No other acute finding. Review of the MIP images confirms the above findings CTA HEAD FINDINGS Anterior circulation: Both internal carotid arteries are widely patent through the siphons without stenosis or other abnormality. A1 segments patent bilaterally. Normal anterior cute artery complex. Anterior cerebral arteries patent without stenosis. No M1 stenosis or occlusion. MCA branches well perfused and symmetric. Posterior circulation: Both V4 segments patent without stenosis. Both PICA patent. Basilar widely patent without stenosis. Short-segment fenestration involving the mid basilar artery noted. Superior cerebellar and posterior  cerebral arteries widely patent bilaterally. Venous sinuses: Patent allowing for timing the contrast bolus. Anatomic variants: None significant. No intracranial aneurysm or other vascular malformation. Review of the MIP images confirms the above findings IMPRESSION: CT HEAD IMPRESSION: 1. No acute intracranial abnormality. 2. Advanced chronic microvascular ischemic disease. CTA HEAD AND NECK IMPRESSION: Negative CTA of the head and neck. No large vessel occlusion, hemodynamically significant stenosis, or other acute vascular abnormality. No aneurysm. Electronically Signed   By: Rise Mu M.D.   On: 09/26/2022 04:06    Procedures Procedures    Medications Ordered in ED Medications  lactated ringers bolus 1,000 mL (has no administration in time range)  metoCLOPramide (REGLAN) injection 10 mg (has no administration in time range)  diphenhydrAMINE (BENADRYL) injection 25 mg (has no administration in time range)  ketorolac (TORADOL) 15 MG/ML injection 15 mg (has no administration in time range)  ondansetron (ZOFRAN) injection 4 mg (has no administration in time range)    ED Course/ Medical Decision Making/ A&P                                 Medical Decision Making Amount and/or Complexity of Data Reviewed Labs: ordered. Decision-making details documented in ED Course. Radiology: ordered and independent interpretation performed. Decision-making details documented in ED Course. ECG/medicine tests: ordered and independent interpretation performed. Decision-making details documented in ED Course.  Risk Prescription drug management.   Severe headache with history of migraines.  Neurological exam is nonfocal.  Blood pressure moderately elevated on arrival.  Low suspicion for subarachnoid hemorrhage, meningitis, temporal arteritis.  Will treat symptomatically and check labs.  IV fluids given, Reglan, Benadryl, Toradol.  CT head is obtained which is negative for hemorrhage.  Negative  for aortic aneurysm or large vessel stenosis.  Results reviewed and interpreted by me.  Lab remarkable for mild AKI.  ESR 29.  Low suspicion for temporal arteritis.  Low suspicion for meningitis or subarachnoid hemorrhage.  Patient's headache has resolved after medications as above.  Blood pressure within reasonable limits at 144/87.  Suspect likely migrainous versus tension headache.  Low suspicion for acute CVA or TIA.  Will refer to neurology and follow-up.  Return to the ED with new or worsening symptoms. Follow up with PCP for recheck of kidney function next week.         Final Clinical Impression(s) / ED Diagnoses Final diagnoses:  Bad headache    Rx / DC Orders ED Discharge Orders     None         Lilja Soland, Jeannett Senior, MD 09/26/22 [phone]

## 2022-09-28 ENCOUNTER — Other Ambulatory Visit (HOSPITAL_COMMUNITY): Payer: Self-pay

## 2022-10-01 ENCOUNTER — Other Ambulatory Visit: Payer: Self-pay

## 2022-10-01 ENCOUNTER — Other Ambulatory Visit (HOSPITAL_COMMUNITY): Payer: Self-pay

## 2022-10-01 NOTE — Progress Notes (Signed)
Specialty Pharmacy Initial Fill Coordination Note  Amber [name] is a 61 y.o. female contacted today regarding refills of specialty medication(s) Abatacept .  Patient requested Delivery  on 10/03/22  to verified address [address] [place], [place] [address]   Medication will be filled on 10/02/2022.   Patient is aware of $4.00 copayment and would like it to be billed to [facility] account.

## 2022-10-02 ENCOUNTER — Encounter: Payer: Self-pay | Admitting: Neurology

## 2022-10-02 ENCOUNTER — Encounter: Payer: Self-pay | Admitting: Oncology

## 2022-10-02 ENCOUNTER — Ambulatory Visit: Payer: Managed Care, Other (non HMO) | Admitting: Neurology

## 2022-10-02 VITALS — BP 113/64 | HR 64 | Ht 61.0 in | Wt 212.0 lb

## 2022-10-02 DIAGNOSIS — R0683 Snoring: Secondary | ICD-10-CM

## 2022-10-02 DIAGNOSIS — Z9189 Other specified personal risk factors, not elsewhere classified: Secondary | ICD-10-CM | POA: Diagnosis not present

## 2022-10-02 DIAGNOSIS — Z72820 Sleep deprivation: Secondary | ICD-10-CM | POA: Diagnosis not present

## 2022-10-02 DIAGNOSIS — R519 Headache, unspecified: Secondary | ICD-10-CM

## 2022-10-02 DIAGNOSIS — G4726 Circadian rhythm sleep disorder, shift work type: Secondary | ICD-10-CM | POA: Diagnosis not present

## 2022-10-02 DIAGNOSIS — G43719 Chronic migraine without aura, intractable, without status migrainosus: Secondary | ICD-10-CM

## 2022-10-02 MED ORDER — EMGALITY 120 MG/ML ~~LOC~~ SOAJ: 120.0000 mg | SUBCUTANEOUS | 5 refills | Status: DC

## 2022-10-02 NOTE — Progress Notes (Signed)
Subjective:    Patient ID: Amber [name] is a 62 y.o. female.  HPI    Interim history:   I saw patient, Amber [name], as a referral from the emergency room for headaches.  The patient is unaccompanied today.  Amber [name] is a 62 year old female with an underlying complex medical history of coronary artery disease, congestive heart failure, hypertension, hyperlipidemia, anemia, rheumatoid arthritis, Sjogren's disease, diabetes, diabetic neuropathy, migraine headaches, palpitations, asthma, and severe obesity with a BMI of over 40, who reports a longstanding history of recurrent headaches including chronic migraines.  She currently is utilizing Imitrex as needed, about once a week, migraine frequency is about once a week, typically right-sided and throbbing associated with photophobia and sometimes double vision even and floaters.  She denies any accompanying one-sided weakness or numbness or tingling or droopy face or slurring of speech.  She is up-to-date with her eye exam and had a eye check in May or June of this year.  She has other headaches which are milder, often wakes up with a headache.  She has a shifting sleep schedule and does not sleep well.  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  She works 3 nights 1 week and 4 nights the next week, alternating.  When she is off she tries to sleep at night.  She snores and does not wake up rested, she is tired and sleepy during the day.  Epworth sleepiness score is 13 out of 24, fatigue severity score is 52 out of 63.  She stopped using the Emgality injections sometime last year, she had started Orencia injections and just stopped using the Emgality although she did not have any side effects.  She would not mind getting back on it.  She does not drink alcohol, she does not smoke.  She limits her caffeine to 1 cup of coffee per day. She had previously seen my colleague Amber [name] in this clinic in 2023.  Prior to that she had seen Amber [name]  in 2022.  I reviewed prior neuro records as well. When she saw Amber [name] in May 2023 she was on Emgality once a month injections for headache prevention and was using Imitrex 100 mg as needed.  She reported improvement in her headaches once she started [employer]  She was advised to continue with Emgality injections once a month and utilize Imitrex 100 mg strength as needed.  She was given low-dose Flexeril for as needed use for neck pain. She presented to the emergency room on 09/25/2022 with a headache.  She reported a right-sided headache.  She reported elevated blood pressure at home.  She denied any visual changes or focal weakness or numbness or tingling.  She endorsed photophobia and phonophobia.  I reviewed the emergency room records.  She was treated symptomatically with metoclopramide, ondansetron, ketorolac, diphenhydramine.  Laboratory testing showed a negative urinalysis.  ESR was mildly elevated at 29, BMP showed elevated BUN at 27, creatinine elevated at 1.56, otherwise benign.  She had a CT angiogram of the head and neck with and without contrast through the emergency room on 09/26/2022 and I reviewed the results:  IMPRESSION: CT HEAD IMPRESSION:   1. No acute intracranial abnormality. 2. Advanced chronic microvascular ischemic disease.   CTA HEAD AND NECK IMPRESSION:   Negative CTA of the head and neck. No large vessel occlusion, hemodynamically significant stenosis, or other acute vascular abnormality. No aneurysm.  The patient's allergies, current medications, family history, past  medical history, past social history, past surgical history and problem list were reviewed and updated as appropriate.   Previously:   02/13/2021 (Amber [name]): <<Headaches have not changed since her last visit. Topamax has not made much of a difference. She takes 50 mg at bedtime but this makes her drowsy. She is having 4 headaches per week. Also has started having shooting pains one month ago.  She will have 2-3 shooting pains which only last a few seconds at a time.   Imitrex helps sometimes but does not fully relieve her headache. She also takes Aleve with it which helps. >>  10/13/2020 (Amber [name]): <<Amber [name] is a 61 year old left-handed black female with a history of rheumatoid arthritis, Sjogren's syndrome, and prediabetes.  The patient has migraine headaches, she continues to have 3-5 headaches a month on average.  She is not missing work usually, the headaches may last up to 2 hours.  She may take Aleve, occasionally she may take sumatriptan.  MRI of the brain shows white matter changes that could be consistent with CADISIL.  Genetic testing however is not fully covered through her insurance, she would have to pay over $1000 for the genetic test procedure.  The patient is not able to afford the evaluation.  She comes to the office today for an evaluation.>>   01/21/2020 (Amber [name]): <<Amber [name] is a 62 year old left-handed black female with a history of rheumatoid arthritis and Sjogren's syndrome.  She has a history of diabetes and borderline diabetes and dyslipidemia.  She also has a history of migraine headache.  She was seen and evaluated through ENT when she lost her hearing suddenly several months ago in the left ear.  She underwent MRI of the brain which showed evidence of at least a moderate level of small vessel ischemic changes but the pattern included the anterior tips of the temporal lobes and involvement of the white matter beneath the external capsule bilaterally.  The patient is on low-dose aspirin.  She has not had any history of overt stroke events, she denies any numbness or weakness of the face, arms, or legs.  She has not had any balance changes or difficulty controlling the bowels or the bladder.  She does report that her older sister died at age 87 with cancer but had a history of cerebrovascular disease prior to her death.  Her sister had diabetes  and hypertension.  The patient does not smoke cigarettes.  Occasionally she may have some problems with dizziness but no syncope.  She does have a history of migraine headaches that occur on average once a week, and it been present for about 2 years.  She is sent to this office for further evaluation.>>  Her Past Medical History Is Significant For: Past Medical History:  Diagnosis Date   Anemia    Arthritis    Asthma 05/03/2020   Blood transfusion without reported diagnosis    CAD (coronary artery disease) 04/24/2017   Cataract    Cerebrovascular disease 10/13/2020   Chronic diastolic CHF (congestive heart failure) (HCC) 04/24/2017   Colon polyps    Controlled type 2 diabetes mellitus without complication, without long-term current use of insulin (HCC) 05/31/2017   Diabetic peripheral neuropathy (HCC) 01/05/2020   Ectopic pregnancy    RIGHT salpingostomy   Elective abortion    Elective abortion 09/26/2020   RIGHT salpingostomy   Essential hypertension 09/04/2017   GERD (gastroesophageal reflux disease)    Glaucoma    both eyes  Headache    migraines   History of chicken pox    History of chicken pox 09/26/2020   migraines   Hyperlipemia    Hypertension    Iron deficiency anemia 09/02/2013   Migraine 04/19/2014   Obesity (BMI 30-39.9) 10/17/2012   Palpitations 10/01/2012   Rheumatoid arthritis (HCC) 11/07/2011   Rheumatoid arthritis(714.0) 11/07/2011   SAB (spontaneous abortion)    SAB (spontaneous abortion) 11/07/2011   Shortness of breath    Sjogren's disease (HCC)    Wheezing     Her Past Surgical History Is Significant For: Past Surgical History:  Procedure Laterality Date   ABDOMINAL SURGERY     bowel repair   COLONOSCOPY     COLONOSCOPY  08/2022   DILATION AND CURETTAGE OF UTERUS     X 2   MENISCUS REPAIR Right    MENISCUS TEAR REPAIR   MYOMECTOMY N/A 07/27/2014   Procedure: ABDOMINAL MYOMECTOMY;  Surgeon: Hoover Browns, MD;  Location: [facility];  Service:  Gynecology;  Laterality: N/A;   PELVIC LAPAROSCOPY  1994   IN 1999 LAPAROSCOPY WITH INCIDENTAL ENTEROTOMY REQUIRING LAPAROTOMY   ROTATOR CUFF REPAIR Left 06/21/2021   UTERINE FIBROID SURGERY     July 2016    Her Family History Is Significant For: Family History  Problem Relation Age of Onset   Hypertension Mother        died from heart disease   Heart disease Mother        deceased, unknown cause   Stroke Mother    Obesity Mother    Diabetes Sister    Hypertension Sister    Esophageal cancer Sister    Stomach cancer Sister    Cancer Sister    Diabetes Sister    Parkinson's disease Brother    Rectal cancer Neg Hx    Colon cancer Neg Hx    Colon polyps Neg Hx     Her Social History Is Significant For: Social History   Socioeconomic History   Marital status: Single    Spouse name: Not on file   Number of children: 0   Years of education: college   Highest education level: Associate degree: occupational, Scientist, product/process development, or vocational program  Occupational History   Occupation: LPN    Employer: [employer]  Tobacco Use   Smoking status: Never    Passive exposure: Never   Smokeless tobacco: Never  Vaping Use   Vaping status: Never Used  Substance and Sexual Activity   Alcohol use: No    Comment: occasional   Drug use: No   Sexual activity: Yes    Birth control/protection: None    Comment: 1st intercourse- 17, partners- 5  Other Topics Concern   Not on file  Social History Narrative   Regular exercise:  No   Caffeine Use: 1 cup coffee daily   No children   "Happily divorced"   Reports that she is involved at her church   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  Had a sister up in [place] who passed away 10/24/15 who she was close to.   Born in [place]- she came to [place] as adult.  Age 14.    Left-handed.         Social Determinants of Health   Financial Resource Strain: Not on file  Food Insecurity: Food Insecurity Present (04/09/2022)   Hunger Vital  Sign    Worried About Running Out of Food in the Last Year: Sometimes true  Ran Out of Food in the Last Year: Sometimes true  Transportation Needs: No Transportation Needs (04/09/2022)   PRAPARE - Administrator, Civil Service (Medical): No    Lack of Transportation (Non-Medical): No  Physical Activity: Sufficiently Active (04/09/2022)   Exercise Vital Sign    Days of Exercise per Week: 2 days    Minutes of Exercise per Session: 120 min  Stress: Stress Concern Present (04/09/2022)   [employer] of Occupational Health - Occupational Stress Questionnaire    Feeling of Stress : To some extent  Social Connections: Unknown (04/09/2022)   Social Connection and Isolation Panel [NHANES]    Frequency of Communication with Friends and Family: More than three times a week    Frequency of Social Gatherings with Friends and Family: Once a week    Attends Religious Services: More than 4 times per year    Active Member of [employer] or Organizations: Not on file    Attends Banker Meetings: Not on file    Marital Status: Divorced    Her Allergies Are:  Allergies  Allergen Reactions   Gabapentin     Made her "crazy"  :   Her Current Medications Are:  Outpatient Encounter Medications as of 10/02/2022  Medication Sig   Abatacept (ORENCIA CLICKJECT) 125 MG/ML SOAJ Inject 125 mg into the skin every 7 (seven) days.   albuterol (VENTOLIN HFA) 108 (90 Base) MCG/ACT inhaler Inhale 2 puffs into the lungs every 6 (six) hours as needed.   aspirin EC 81 MG tablet Take 1 tablet (81 mg total) by mouth daily.   calcium-vitamin D (OSCAL WITH D) 500-200 MG-UNIT TABS tablet Take by mouth.   carvedilol (COREG) 25 MG tablet Take 1 tablet (25 mg total) by mouth 2 (two) times daily with a meal.   Cyanocobalamin (VITAMIN B-12) 5000 MCG LOZG Take 1 tablet by mouth 2 (two) times daily.   diltiazem (CARTIA XT) 240 MG 24 hr capsule Take 1 capsule (240 mg total) by mouth daily.   fluticasone (FLONASE)  50 MCG/ACT nasal spray Place 2 sprays into both nostrils daily. (Patient taking differently: Place 2 sprays into both nostrils as needed.)   ibuprofen (ADVIL) 200 MG tablet Take 200 mg by mouth every 6 (six) hours as needed.   Iron, Ferrous Sulfate, 325 (65 Fe) MG TABS Take 325 mg by mouth 2 (two) times daily.   SUMAtriptan (IMITREX) 100 MG tablet Take 1 tablet (100 mg total) by mouth as needed for migraine. May repeat in 2 hours if headache persists or recurs.   timolol (TIMOPTIC) 0.5 % ophthalmic solution 1 drop every morning.   TRAVATAN Z 0.004 % SOLN ophthalmic solution Place 1 drop into both eyes at bedtime.    triamterene-hydrochlorothiazide (MAXZIDE-25) 37.5-25 MG tablet Take 1 tablet by mouth daily.   valsartan (DIOVAN) 320 MG tablet Take 1 tablet (320 mg total) by mouth daily.   No facility-administered encounter medications on file as of 10/02/2022.  :  Review of Systems:  Out of a complete 14 point review of systems, all are reviewed and negative with the exception of these symptoms as listed below:  Review of Systems  Neurological:        Patient is here alone for ER referral for bad headaches. She states she woke up with one this morning. She is getting a headache at least once a week. She is unable to identify any particular trigger. She takes Advil 3 times most everyday. It is not  helping anymore.    Objective:  Neurological Exam  Physical Exam Physical Examination:   Vitals:   10/02/22 0841  BP: 113/64  Pulse: 64    General Examination: The patient is a very pleasant 62 y.o. female in no acute distress. She appears well-developed and well-nourished and well groomed.   HEENT: Normocephalic, atraumatic, pupils are equal, round and reactive to light, extraocular tracking is good without limitation to gaze excursion or nystagmus noted. Hearing is grossly intact. Face is symmetric with normal facial animation. Speech is clear with no dysarthria noted. There is no  hypophonia. There is no lip, neck/head, jaw or voice tremor. Neck is supple with full range of passive and active motion. There are no carotid bruits on auscultation. Oropharynx exam reveals: mild mouth dryness, good dental hygiene and marked airway crowding, due to Mallampati class IV, larger tongue, tonsils and uvula not fully visualized even with phonation.  She has no overbite.  Neck circumference 14 three-quarter inches.  Tongue protrudes centrally.  Visible palate elevates symmetrically.  Chest: Clear to auscultation without wheezing, rhonchi or crackles noted.  Heart: S1+S2+0, regular and normal without murmurs, rubs or gallops noted.   Abdomen: Soft, non-tender and non-distended.  Extremities: There is no pitting edema in the distal lower extremities bilaterally.   Skin: Warm and dry without trophic changes noted.   Musculoskeletal: exam reveals no obvious joint deformities.   Neurologically:  Mental status: The patient is awake, alert and oriented in all 4 spheres. Her immediate and remote memory, attention, language skills and fund of knowledge are appropriate. There is no evidence of aphasia, agnosia, apraxia or anomia. Speech is clear with normal prosody and enunciation. Thought process is linear. Mood is normal and affect is normal.  Cranial nerves II - XII are as described above under HEENT exam.  Motor exam: Normal bulk, strength and tone is noted.  There is no drift, postural tremor, no rebound, no action tremor or resting tremor.  Fine motor skills and coordination: intact finger taps, hand movements, rapid alternating patting with both upper extremities, normal foot taps bilaterally in the lower extremities.  Cerebellar testing: No dysmetria or intention tremor. There is no truncal or gait ataxia.  Normal finger-to-nose, normal heel-to-shin bilaterally.  Reflexes 1+ throughout, toes are downgoing bilaterally.  Sensory exam: intact to light touch in the upper and lower  extremities.  Gait, station and balance: She stands easily. No veering to one side is noted. No leaning to one side is noted. Posture is age-appropriate and stance is narrow based. Gait shows normal stride length and normal pace. No problems turning are noted.   Assessment and Plan:  In summary, NASHIKA COKER is a very pleasant 62 y.o.-year old female with an underlying complex medical history of coronary artery disease, congestive heart failure, hypertension, hyperlipidemia, anemia, rheumatoid arthritis, Sjogren's disease, diabetes, diabetic neuropathy, migraine headaches, palpitations, asthma, and severe obesity with a BMI of over 40, presents for reevaluation of her recurrent headaches of many years duration, particularly migraines of chronic nature.  She had good results with Emgality injections, she still uses Imitrex as needed per PCP.  She is advised to get back on Emgality injections, I will restart it.  She we also talked about her sleep disorder today, she is at risk for obstructive sleep apnea and would be willing to proceed with a sleep study.  If she has obstructive sleep apnea, she is advised that treating it with a CPAP or AutoPap machine may very well  help her sleep disturbance but also her headaches.  She is encouraged to continue to work on weight loss.  She is advised to follow-up after her sleep test.  We will get in touch once we have insurance authorization to proceed with testing, she would prefer an overnight sleep study in the sleep lab on a day off, she generally sleeps a little better when she sleeps at night.  We will try to accommodate as best as possible.  Her neurological exam is nonfocal.  She had a recent checkup with her eye doctor as well within the past 6 months.  I answered all her questions today and she was in agreement with our plan. I spent 60 minutes in total face-to-face time and in reviewing records during pre-charting, more than 50% of which was spent in  counseling and coordination of care, reviewing test results, reviewing medications and treatment regimen and/or in discussing or reviewing the diagnosis of recurrent headaches, sleep disturbance, risk for sleep apnea, the prognosis and treatment options. Pertinent laboratory and imaging test results that were available during this visit with the patient were reviewed by me and considered in my medical decision making (see chart for details).

## 2022-10-02 NOTE — Telephone Encounter (Signed)
Onboarding has been completed, nothing further required.

## 2022-10-02 NOTE — Patient Instructions (Addendum)
It was nice to meet you today.  We will restart your Emgality injections as you had good results with them last year.  Start Emgality, 120 mg/ml, 1 inj subcutaneously every 30 days.  Potential side effects may include constipation, developing antibodies, local injection site reaction, muscle cramps or muscle spasms rarely, and local reaction such as itching and redness, very rare serious allergy reaction which is called anaphylaxis, or angioedema.

## 2022-10-16 DIAGNOSIS — H524 Presbyopia: Secondary | ICD-10-CM | POA: Diagnosis not present

## 2022-10-17 ENCOUNTER — Other Ambulatory Visit (HOSPITAL_COMMUNITY): Payer: Self-pay

## 2022-10-17 ENCOUNTER — Encounter: Payer: Self-pay | Admitting: Oncology

## 2022-10-17 ENCOUNTER — Telehealth: Payer: Self-pay

## 2022-10-17 ENCOUNTER — Ambulatory Visit (INDEPENDENT_AMBULATORY_CARE_PROVIDER_SITE_OTHER): Payer: Managed Care, Other (non HMO) | Admitting: Family

## 2022-10-17 VITALS — BP 108/70 | HR 64 | Temp 98.4°F | Resp 18 | Ht 61.0 in | Wt 219.6 lb

## 2022-10-17 DIAGNOSIS — I5032 Chronic diastolic (congestive) heart failure: Secondary | ICD-10-CM | POA: Diagnosis not present

## 2022-10-17 DIAGNOSIS — J45909 Unspecified asthma, uncomplicated: Secondary | ICD-10-CM | POA: Diagnosis not present

## 2022-10-17 DIAGNOSIS — G6289 Other specified polyneuropathies: Secondary | ICD-10-CM | POA: Diagnosis not present

## 2022-10-17 DIAGNOSIS — I10 Essential (primary) hypertension: Secondary | ICD-10-CM

## 2022-10-17 DIAGNOSIS — D509 Iron deficiency anemia, unspecified: Secondary | ICD-10-CM | POA: Diagnosis not present

## 2022-10-17 DIAGNOSIS — E785 Hyperlipidemia, unspecified: Secondary | ICD-10-CM | POA: Diagnosis not present

## 2022-10-17 DIAGNOSIS — E119 Type 2 diabetes mellitus without complications: Secondary | ICD-10-CM

## 2022-10-17 DIAGNOSIS — E538 Deficiency of other specified B group vitamins: Secondary | ICD-10-CM

## 2022-10-17 DIAGNOSIS — G43009 Migraine without aura, not intractable, without status migrainosus: Secondary | ICD-10-CM

## 2022-10-17 LAB — VITAMIN B12: Vitamin B-12: >1501 pg/mL — ABNORMAL HIGH (ref 211–911)

## 2022-10-17 LAB — BASIC METABOLIC PANEL
BUN: 13 mg/dL (ref 6–23)
CO2: 29 meq/L (ref 19–32)
Calcium: 9.7 mg/dL (ref 8.4–10.5)
Chloride: 103 meq/L (ref 96–112)
Creatinine, Ser: 0.71 mg/dL (ref 0.40–1.20)
GFR: 91.85 mL/min (ref >60.00)
Glucose, Bld: 118 mg/dL — ABNORMAL HIGH (ref 70–99)
Potassium: 3.6 meq/L (ref 3.5–5.1)
Sodium: 139 meq/L (ref 135–145)

## 2022-10-17 LAB — HEMOGLOBIN A1C: Hgb A1c MFr Bld: 6.7 % — ABNORMAL HIGH (ref 4.6–6.5)

## 2022-10-17 MED ORDER — CARVEDILOL 25 MG PO TABS: 25.0000 mg | ORAL_TABLET | Freq: Two times a day (BID) | ORAL | 5 refills | Status: DC

## 2022-10-17 MED ORDER — VALSARTAN 320 MG PO TABS: 320.0000 mg | ORAL_TABLET | Freq: Every day | ORAL | 5 refills | Status: DC

## 2022-10-17 NOTE — Assessment & Plan Note (Signed)
Reports hypersomnolence on gabapentin.

## 2022-10-17 NOTE — Assessment & Plan Note (Signed)
Lab Results  Component Value Date   CHOL 126 07/17/2022   HDL 49.30 07/17/2022   LDLCALC 57 07/17/2022   TRIG 98.0 07/17/2022   CHOLHDL 3 07/17/2022   Last lipid panel at goal. Monitor.

## 2022-10-17 NOTE — Assessment & Plan Note (Addendum)
Lab Results  Component Value Date   HGBA1C 6.7 (H) 07/17/2022   HGBA1C 6.6 (H) 03/07/2022   HGBA1C 6.5 11/07/2021   Lab Results  Component Value Date   MICROALBUR 1.0 03/07/2022   LDLCALC 57 07/17/2022   CREATININE 1.56 (H) 09/25/2022   This is currently diet controlled.  Update A1C.

## 2022-10-17 NOTE — Assessment & Plan Note (Signed)
BP Readings from Last 3 Encounters:  10/17/22 108/70  10/02/22 113/64  09/26/22 137/77   At goal. Continue current meds (diovan, diltiazem, carvedilol, maxide).

## 2022-10-17 NOTE — Assessment & Plan Note (Signed)
No recent symptoms. Monitor.

## 2022-10-17 NOTE — Assessment & Plan Note (Addendum)
Wt Readings from Last 3 Encounters:  10/17/22 219 lb 9.6 oz (99.6 kg)  10/02/22 212 lb (96.2 kg)  09/25/22 216 lb (98 kg)   Clinically euvolemic. She is advised to let us know if she develops increased swelling or further weight gain.

## 2022-10-17 NOTE — Assessment & Plan Note (Signed)
Update b12 level today.

## 2022-10-17 NOTE — Assessment & Plan Note (Signed)
Uncontrolled. Seeing neurology. Starting [employer]

## 2022-10-17 NOTE — Telephone Encounter (Signed)
*  [facility]  Pharmacy Patient Advocate Encounter  Received notification from [facility] that Prior Authorization for Emgality 120MG /ML auto-injectors (migraine)  has been APPROVED from 10/17/2022 to 10/17/2023. Ran test claim, Copay is $4.00. This test claim was processed through [facility]- copay amounts may vary at other pharmacies due to pharmacy/plan contracts, or as the patient moves through the different stages of their insurance plan.   PA #/Case ID/Reference #: [id number]
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
## 2022-10-17 NOTE — Patient Instructions (Signed)
VISIT SUMMARY:  During your recent visit, we discussed your ongoing health conditions including migraines, peripheral neuropathy, hypertension, asthma, and edema. We also discussed your general health maintenance and weight management.  YOUR PLAN:  -MIGRAINE: You recently had a severe headache with visual disturbances that led to an ER visit. A neurologist recommended Emgality, a medication to prevent migraines. We will continue with this plan.  -PERIPHERAL NEUROPATHY: You're experiencing significant discomfort and sleep disturbances due to peripheral neuropathy, a condition that causes nerve damage, often resulting in pain and numbness in your hands and feet. We will consider alternative options for managing this condition since the previous medication, Gabapentin, caused excessive drowsiness.  -HYPERTENSION: You had a recent episode of high blood pressure that led to an ER visit. Hypertension is a condition where the force of the blood against the artery walls is too high. We will continue with your current regimen of Carvedilol, Diltiazem, Triamterene-Hydrochlorothiazide, and Valsartan, and refill your Carvedilol and Valsartan prescriptions.  -ASTHMA: Your asthma, a condition that causes your airways to become inflamed and narrow, is currently well-controlled with no recent exacerbations. We will continue with the current management plan.  -EDEMA: You've noted some swelling in your ankles, known as edema. This can be caused by fluid accumulation in your body. We will monitor your fluid balance closely.  -WEIGHT MANAGEMENT: We discussed the importance of managing your weight for overall health. We will resubmit a referral for a weight management program.  -GENERAL HEALTH MAINTENANCE: We will repeat your A1C and kidney function tests, consider a COVID booster vaccination, and schedule a mammogram for this month if possible. We will follow up in three months.  INSTRUCTIONS:  Please continue  taking your current medications as prescribed. Monitor your blood pressure regularly and report any significant changes. Pay attention to your fluid intake and report any increased swelling in your ankles. Follow up with the weight management program once the referral is processed. Please schedule your mammogram and consider getting a COVID booster vaccination. We will see you again in three months.

## 2022-10-17 NOTE — Assessment & Plan Note (Signed)
Lab Results  Component Value Date   WBC 5.9 09/25/2022   HGB 13.5 09/25/2022   HCT 43.6 09/25/2022   MCV 86.3 09/25/2022   PLT 261 09/25/2022   Continue iron 325mg  tid.

## 2022-10-18 ENCOUNTER — Encounter: Payer: Self-pay | Admitting: Family

## 2022-10-18 DIAGNOSIS — E538 Deficiency of other specified B group vitamins: Secondary | ICD-10-CM

## 2022-10-30 ENCOUNTER — Telehealth: Payer: Self-pay | Admitting: Neurology

## 2022-10-30 ENCOUNTER — Encounter (HOSPITAL_BASED_OUTPATIENT_CLINIC_OR_DEPARTMENT_OTHER): Payer: Self-pay

## 2022-10-30 ENCOUNTER — Ambulatory Visit (HOSPITAL_BASED_OUTPATIENT_CLINIC_OR_DEPARTMENT_OTHER)
Admission: RE | Admit: 2022-10-30 | Discharge: 2022-10-30 | Disposition: A | Discharge status: Home / Self Care | Payer: Managed Care, Other (non HMO) | Source: Ambulatory Visit | Attending: Family | Admitting: Family

## 2022-10-30 DIAGNOSIS — Z1231 Encounter for screening mammogram for malignant neoplasm of breast: Secondary | ICD-10-CM | POA: Diagnosis present

## 2022-10-30 NOTE — Telephone Encounter (Signed)
NPSG-  [facility]: [id number] (exp. 10/04/22 to 01/01/23)  Raye Sorrow message.

## 2022-10-30 NOTE — Telephone Encounter (Signed)
I left a voicemail for the patient to call back to schedule also informed her I left her a mychart.

## 2022-11-05 ENCOUNTER — Ambulatory Visit: Payer: Managed Care, Other (non HMO) | Admitting: Nurse Practitioner

## 2022-11-07 NOTE — Telephone Encounter (Signed)
I left a voicemail for patient to call to schedule SS.

## 2022-11-12 ENCOUNTER — Ambulatory Visit (HOSPITAL_BASED_OUTPATIENT_CLINIC_OR_DEPARTMENT_OTHER): Payer: Medicaid Other

## 2022-11-12 DIAGNOSIS — Z0289 Encounter for other administrative examinations: Secondary | ICD-10-CM

## 2022-11-13 ENCOUNTER — Ambulatory Visit (INDEPENDENT_AMBULATORY_CARE_PROVIDER_SITE_OTHER): Payer: Managed Care, Other (non HMO) | Admitting: Family Medicine

## 2022-11-13 ENCOUNTER — Encounter: Payer: Self-pay | Admitting: Family Medicine

## 2022-11-13 VITALS — BP 122/80 | HR 63 | Temp 98.9°F | Ht 60.5 in | Wt 211.0 lb

## 2022-11-13 DIAGNOSIS — I10 Essential (primary) hypertension: Secondary | ICD-10-CM | POA: Diagnosis not present

## 2022-11-13 DIAGNOSIS — Z6841 Body Mass Index (BMI) 40.0 and over, adult: Secondary | ICD-10-CM | POA: Diagnosis not present

## 2022-11-13 DIAGNOSIS — E66813 Obesity, class 3: Secondary | ICD-10-CM

## 2022-11-13 DIAGNOSIS — E119 Type 2 diabetes mellitus without complications: Secondary | ICD-10-CM | POA: Diagnosis not present

## 2022-11-13 DIAGNOSIS — M0579 Rheumatoid arthritis with rheumatoid factor of multiple sites without organ or systems involvement: Secondary | ICD-10-CM | POA: Diagnosis not present

## 2022-11-13 NOTE — Progress Notes (Signed)
Office: [phone]  /  Fax: [phone]   Initial Visit  Amber [name] was seen in clinic today to evaluate for obesity. She is interested in losing weight to improve overall health and reduce the risk of weight related complications. She presents today to review program treatment options, initial physical assessment, and evaluation.     She was referred by: PCP  When asked what else they would like to accomplish? She states: Improve energy levels and physical activity, Improve existing medical conditions, Reduce number of medications, Improve quality of life, and Improve appearance  Weight history: started to gain weight with menopause and after surgery last year.  Maintained her weight around 197 lb for years and in the 170s in her 30s.  Overweight in childhood  When asked how has your weight affected you? She states: Contributed to medical problems, Contributed to orthopedic problems or mobility issues, and Having fatigue  Some associated conditions: Hypertension, Arthritis:knees, Hyperlipidemia, Diabetes, Heart disease, and Connective tissue disease: RA and Sjogren's  Contributing factors: Family history of obesity, Disruption of circadian rhythm / sleep disordered breathing, Moderate to high levels of stress, and Menopause  Weight promoting medications identified: None  Current nutrition plan: None  Current level of physical activity: None  Current or previous pharmacotherapy: None  Response to medication: Never tried medications   Past medical history includes:   Past Medical History:  Diagnosis Date   Anemia    Arthritis    Asthma 05/03/2020   Blood transfusion without reported diagnosis    CAD (coronary artery disease) 04/24/2017   Cataract    Cerebrovascular disease 10/13/2020   Chronic diastolic CHF (congestive heart failure) (HCC) 04/24/2017   Colon polyps    Controlled type 2 diabetes mellitus without complication, without long-term current use of  insulin (HCC) 05/31/2017   Diabetic peripheral neuropathy (HCC) 01/05/2020   Ectopic pregnancy    RIGHT salpingostomy   Elective abortion    Elective abortion 09/26/2020   RIGHT salpingostomy   Essential hypertension 09/04/2017   GERD (gastroesophageal reflux disease)    Glaucoma    both eyes   Headache    migraines   History of chicken pox    History of chicken pox 09/26/2020   migraines   Hyperlipemia    Hypertension    Iron deficiency anemia 09/02/2013   Migraine 04/19/2014   Obesity (BMI 30-39.9) 10/17/2012   Palpitations 10/01/2012   Rheumatoid arthritis (HCC) 11/07/2011   Rheumatoid arthritis(714.0) 11/07/2011   SAB (spontaneous abortion)    SAB (spontaneous abortion) 11/07/2011   Shortness of breath    Sjogren's disease (HCC)    Wheezing      Objective:   BP 122/80   Pulse 63   Temp 98.9 F (37.2 C)   Ht 5' 0.5" (1.537 m)   Wt 211 lb (95.7 kg)   LMP 08/09/2016   SpO2 98%   BMI 40.53 kg/m  She was weighed on the bioimpedance scale: Body mass index is 40.53 kg/m.  Peak Weight:211 , Body Fat%:48, Visceral Fat Rating:16, Weight trend over the last 12 months: Unchanged  General:  Alert, oriented and cooperative. Patient is in no acute distress.  Respiratory: Normal respiratory effort, no problems with respiration noted   Gait: able to ambulate independently  Mental Status: Normal mood and affect. Normal behavior. Normal judgment and thought content.   DIAGNOSTIC DATA REVIEWED:  BMET    Component Value Date/Time   NA 139 10/17/2022 0823   NA 140 09/04/2017 1124  NA 138 01/21/2015 0935   K 3.6 10/17/2022 0823   K 3.9 01/21/2015 0935   CL 103 10/17/2022 0823   CL 108 (H) 06/25/2012 1508   CO2 29 10/17/2022 0823   CO2 25 01/21/2015 0935   GLUCOSE 118 (H) 10/17/2022 0823   GLUCOSE 128 01/21/2015 0935   GLUCOSE 107 (H) 06/25/2012 1508   BUN 13 10/17/2022 0823   BUN 10 09/04/2017 1124   BUN 7.0 01/21/2015 0935   CREATININE 0.71 10/17/2022 0823    CREATININE 0.63 09/20/2022 0858   CREATININE 0.8 01/21/2015 0935   CALCIUM 9.7 10/17/2022 0823   CALCIUM 9.4 01/21/2015 0935   GFRNONAA 38 (L) 09/25/2022 2132   GFRNONAA 99 02/19/2020 1339   GFRAA 115 02/19/2020 1339   Lab Results  Component Value Date   HGBA1C 6.7 (H) 10/17/2022   HGBA1C 6.0 (H) 09/15/2010   Lab Results  Component Value Date   INSULIN 16.5 09/04/2017   CBC    Component Value Date/Time   WBC 5.9 09/25/2022 2132   RBC 5.05 09/25/2022 2132   HGB 13.5 09/25/2022 2132   HGB 13.8 10/27/2018 1145   HGB 12.4 09/13/2016 0935   HCT 43.6 09/25/2022 2132   HCT 39.6 09/13/2016 0935   PLT 261 09/25/2022 2132   PLT 208 10/03/2018 1512   PLT 194 09/13/2016 0935   MCV 86.3 09/25/2022 2132   MCV 87.8 09/13/2016 0935   MCH 26.7 09/25/2022 2132   MCHC 31.0 09/25/2022 2132   RDW 14.2 09/25/2022 2132   RDW 14.4 09/13/2016 0935   Iron/TIBC/Ferritin/ %Sat    Component Value Date/Time   IRON 87 07/17/2022 0837   IRON 79 09/13/2016 0935   TIBC 302 07/17/2022 0837   TIBC 326 09/13/2016 0935   FERRITIN 73 07/17/2022 0837   FERRITIN 40 09/13/2016 0935   IRONPCTSAT 29 07/17/2022 0837   Lipid Panel     Component Value Date/Time   CHOL 126 07/17/2022 0837   CHOL 158 09/04/2017 1124   TRIG 98.0 07/17/2022 0837   HDL 49.30 07/17/2022 0837   HDL 48 09/04/2017 1124   CHOLHDL 3 07/17/2022 0837   VLDL 19.6 07/17/2022 0837   LDLCALC 57 07/17/2022 0837   LDLCALC 73 09/21/2019 1137   Hepatic Function Panel     Component Value Date/Time   PROT 8.4 (H) 09/20/2022 0858   PROT 9.2 (H) 10/27/2018 1145   PROT 9.2 (H) 01/21/2015 0935   ALBUMIN 4.1 07/17/2022 0837   ALBUMIN 4.4 09/04/2017 1124   ALBUMIN 3.5 01/21/2015 0935   AST 13 09/20/2022 0858   AST 17 01/21/2015 0935   ALT 14 09/20/2022 0858   ALT 15 01/21/2015 0935   ALKPHOS 50 07/17/2022 0837   ALKPHOS 54 01/21/2015 0935   BILITOT 0.5 09/20/2022 0858   BILITOT 1.0 09/04/2017 1124   BILITOT 0.55 01/21/2015 0935    BILIDIR 0.2 09/10/2018 1040      Component Value Date/Time   TSH 1.05 08/29/2020 1037     Assessment and Plan:   Controlled type 2 diabetes mellitus without complication, without long-term current use of insulin (HCC) Assessment & Plan: Lab Results  Component Value Date   HGBA1C 6.7 (H) 10/17/2022   Complicated by neuropathy Controlled with diet alone Lacks regular exercise due to arthritis pain Has struggled more with a lower carb/ lower sugar diet  Consider medication treatment to also aid in obesity management Plan to begin active plan for weight loss with dietary changes   Class 3 severe  obesity due to excess calories with body mass index (BMI) of 40.0 to 44.9 in adult, unspecified whether serious comorbidity present [facility])  Essential hypertension Assessment & Plan: Blood pressure is well-controlled on carvedilol 25 mg twice daily, diltiazem 240 mg once daily, Maxide 37.5/25 mg once daily, valsartan 320 mg once daily.  She denies headache or chest pain.  Look for improving blood pressure control with weight reduction.  Continue current medication as prescribed   Rheumatoid arthritis involving multiple sites with positive rheumatoid factor (HCC) Assessment & Plan: Managed by Dr. Corliss Skains, patient is currently on Orencia 125 mg q. 7 days, using ibuprofen as needed for pain.  She feels that arthritis pain has limited her ability to exercise which has contributed to even further weight gain over time.  Begin active plan for weight reduction looking for improvements in weightbearing joint pain.         Obesity Treatment / Action Plan:  Patient will work on garnering support from family and friends to begin weight loss journey. Will work on eliminating or reducing the presence of highly palatable, calorie dense foods in the home. Will complete provided nutritional and psychosocial assessment questionnaire before the next appointment. Will be scheduled for indirect  calorimetry to determine resting energy expenditure in a fasting state.  This will allow [place] to create a reduced calorie, high-protein meal plan to promote loss of fat mass while preserving muscle mass. Will think about ideas on how to incorporate physical activity into their daily routine. Counseled on the health benefits of losing 5%-15% of total body weight. Was counseled on nutritional approaches to weight loss and benefits of reducing processed foods and consuming plant-based foods and high quality protein as part of nutritional weight management. Was counseled on pharmacotherapy and role as an adjunct in weight management.   Obesity Education Performed Today:  She was weighed on the bioimpedance scale and results were discussed and documented in the synopsis.  We discussed obesity as a disease and the importance of a more detailed evaluation of all the factors contributing to the disease.  We discussed the importance of long term lifestyle changes which include nutrition, exercise and behavioral modifications as well as the importance of customizing this to her specific health and social needs.  We discussed the benefits of reaching a healthier weight to alleviate the symptoms of existing conditions and reduce the risks of the biomechanical, metabolic and psychological effects of obesity.  Judieth Keens Leng appears to be in the action stage of change and states they are ready to start intensive lifestyle modifications and behavioral modifications.  30 minutes was spent today on this visit including the above counseling, pre-visit chart review, and post-visit documentation.  Reviewed by clinician on day of visit: allergies, medications, problem list, medical history, surgical history, family history, social history, and previous encounter notes pertinent to obesity diagnosis.    Seymour Bars, D.O. DABFM, DABOM [facility] [phone] [place], [place]  [phone]

## 2022-11-13 NOTE — Assessment & Plan Note (Signed)
Lab Results  Component Value Date   HGBA1C 6.7 (H) 10/17/2022   Complicated by neuropathy Controlled with diet alone Lacks regular exercise due to arthritis pain Has struggled more with a lower carb/ lower sugar diet  Consider medication treatment to also aid in obesity management Plan to begin active plan for weight loss with dietary changes

## 2022-11-13 NOTE — Telephone Encounter (Signed)
Patient returned my call.  NPSG- MCD [facility]: [id number] (exp. 10/04/22 to 01/01/23)   She is scheduled at [facility] for 12/16/22 at 8 pm.  Mailed packet to the patient.

## 2022-11-13 NOTE — Assessment & Plan Note (Signed)
Blood pressure is well-controlled on carvedilol 25 mg twice daily, diltiazem 240 mg once daily, Maxide 37.5/25 mg once daily, valsartan 320 mg once daily.  She denies headache or chest pain.  Look for improving blood pressure control with weight reduction.  Continue current medication as prescribed

## 2022-11-13 NOTE — Telephone Encounter (Signed)
I called the patient again and she did not pick up I left another voicemail about scheduling her SS. The 12/16/22 SS appt is no longer available.

## 2022-11-13 NOTE — Assessment & Plan Note (Signed)
Managed by Dr. Corliss Skains, patient is currently on Orencia 125 mg q. 7 days, using ibuprofen as needed for pain.  She feels that arthritis pain has limited her ability to exercise which has contributed to even further weight gain over time.  Begin active plan for weight reduction looking for improvements in weightbearing joint pain.

## 2022-12-03 ENCOUNTER — Other Ambulatory Visit: Payer: Self-pay

## 2022-12-03 ENCOUNTER — Ambulatory Visit: Payer: Managed Care, Other (non HMO) | Admitting: Family Medicine

## 2022-12-04 ENCOUNTER — Ambulatory Visit: Payer: Managed Care, Other (non HMO) | Admitting: Family Medicine

## 2022-12-05 ENCOUNTER — Encounter: Payer: Self-pay | Admitting: Bariatrics

## 2022-12-05 ENCOUNTER — Other Ambulatory Visit (HOSPITAL_COMMUNITY): Payer: Self-pay

## 2022-12-05 ENCOUNTER — Ambulatory Visit (INDEPENDENT_AMBULATORY_CARE_PROVIDER_SITE_OTHER): Payer: Managed Care, Other (non HMO) | Admitting: Bariatrics

## 2022-12-05 VITALS — BP 143/85 | HR 73 | Temp 98.3°F | Ht 60.5 in | Wt 214.0 lb

## 2022-12-05 DIAGNOSIS — Z1331 Encounter for screening for depression: Secondary | ICD-10-CM

## 2022-12-05 DIAGNOSIS — R0602 Shortness of breath: Secondary | ICD-10-CM

## 2022-12-05 DIAGNOSIS — Z0000 Encounter for general adult medical examination without abnormal findings: Secondary | ICD-10-CM | POA: Diagnosis not present

## 2022-12-05 DIAGNOSIS — R5383 Other fatigue: Secondary | ICD-10-CM

## 2022-12-05 DIAGNOSIS — I10 Essential (primary) hypertension: Secondary | ICD-10-CM

## 2022-12-05 DIAGNOSIS — E559 Vitamin D deficiency, unspecified: Secondary | ICD-10-CM | POA: Diagnosis not present

## 2022-12-05 DIAGNOSIS — Z6841 Body Mass Index (BMI) 40.0 and over, adult: Secondary | ICD-10-CM

## 2022-12-05 DIAGNOSIS — E119 Type 2 diabetes mellitus without complications: Secondary | ICD-10-CM | POA: Diagnosis not present

## 2022-12-05 DIAGNOSIS — E538 Deficiency of other specified B group vitamins: Secondary | ICD-10-CM | POA: Diagnosis not present

## 2022-12-05 DIAGNOSIS — D500 Iron deficiency anemia secondary to blood loss (chronic): Secondary | ICD-10-CM | POA: Diagnosis not present

## 2022-12-05 NOTE — Progress Notes (Signed)
At a Glance:  Vitals Temp: 98.3 F (36.8 C) BP: (!) 143/85 Pulse Rate: 73 SpO2: 100 %   Anthropometric Measurements Height: 5' 0.5" (1.537 m) Weight: 214 lb (97.1 kg) BMI (Calculated): 41.09 Starting Weight: 214lb   Body Composition  Body Fat %: 49.2 % Fat Mass (lbs): 105.6 lbs Muscle Mass (lbs): 103.4 lbs Total Body Water (lbs): 79.2 lbs Visceral Fat Rating : 16   Other Clinical Data RMR: 1469 Fasting: yes Labs: yes Today's Visit #: 1 Starting Date: 12/05/22     Indirect Calorimeter:   Resting Metabolic Rate ( RMR):  RMR (actual): 1469 kcal RMR (calculated):1551 kcal The calculated basal metabolic rate is 4098 thus her basal metabolic rate is worse than expected.  Plan:   Indirect calorimeter completed, interpreted and reviewed with patient today and allowed to ask questions.  Discussed the implications for the chosen plan and exercise based on the RMR reading.  Will consider repeating the RMR in the future based on weight loss.    Chief Complaint:  Obesity   Subjective:  Amber [name] (MR# [id number]) is a 62 y.o. female who presents for evaluation and treatment of obesity and related comorbidities.   Amber [name] is currently in the action stage of change and ready to dedicate time achieving and maintaining a healthier weight. Amber [name] is interested in becoming our patient and working on intensive lifestyle modifications including (but not limited to) diet and exercise for weight loss.  Amber [name] has been struggling with her weight. She has been unsuccessful in either losing weight, maintaining weight loss, or reaching her healthy weight goal.  Amber [name]'s habits were reviewed today and are as follows: she struggles with family and or coworkers weight loss sabotage and she started gaining weight age 89.   She started gaining weight age 74 ,mainly menopause.   Current or previous pharmacotherapy: None  Response to medication: Never tried  medications  Other Fatigue Amber [name] admits to daytime somnolence and admits to waking up still tired. Patient has a history of symptoms of fatigue. 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 . Snoring is present. Apneic episodes is not present. Epworth Sleepiness Score is 3.   Shortness of Breath Amber [name] notes increasing shortness of breath with exercising and seems to be worsening over time with weight gain. She notes getting out of breath sooner with activity than she used to. This has not gotten worse recently. Kelbie denies shortness of breath at rest or orthopnea.  Depression Screen Adine's Food and Mood (modified PHQ-9) score was 10. 10-14 moderate depression     07/17/2022    8:02 AM  Depression screen PHQ 2/9  Decreased Interest 0  Down, Depressed, Hopeless 0  PHQ - 2 Score 0  Altered sleeping 0  Tired, decreased energy 0  Change in appetite 0  Feeling bad or failure about yourself  0  Trouble concentrating 0  Moving slowly or fidgety/restless 0  Suicidal thoughts 0  PHQ-9 Score 0  Difficult doing work/chores Not difficult at all     Assessment and Plan:   Other Fatigue Amber [name] does feel that her weight is causing her energy to be lower than it should be. Fatigue may be related to obesity, depression or many other causes. Labs will be ordered, and in the meanwhile, Amber [name] will focus on self care including making healthy food choices, increasing physical activity and focusing on stress reduction.  Shortness of Breath Amber [name] does feel that she gets out of breath more easily that she used to when she exercises. Amber [name]'s shortness of breath appears to be obesity related and exercise induced. She has agreed to work on weight loss and gradually increase exercise to treat her exercise induced shortness of breath. Will continue to monitor  closely.  Hypertension Hypertension control uncertain. She did not take medication today.  Medication(s): Coreg 25 mg twice daily , Diovan 320 mg, and Maxzide 37.5/25 mg daily  and Diltiazem XT 240 mg daily.   BP Readings from Last 3 Encounters:  12/05/22 (!) 143/85  11/13/22 122/80  10/17/22 108/70   Lab Results  Component Value Date   CREATININE 0.71 10/17/2022   CREATININE 1.56 (H) 09/25/2022   CREATININE 0.63 09/20/2022   Lab Results  Component Value Date   GFR 91.85 10/17/2022   GFR 93.59 07/17/2022   GFR 93.83 03/07/2022    Plan: Continue all antihypertensives at current dosages. No added salt. Will keep sodium content to 1,500 mg or less per day.    Type II Diabetes HgbA1c is at goal. Last A1c was 6.7 CBGs: Not checking      Episodes of hypoglycemia: no Medication(s):none. Will discuss at her next visit.   Lab Results  Component Value Date   HGBA1C 6.7 (H) 10/17/2022   HGBA1C 6.7 (H) 07/17/2022   HGBA1C 6.6 (H) 03/07/2022   Lab Results  Component Value Date   MICROALBUR 1.0 03/07/2022   LDLCALC 57 07/17/2022   CREATININE 0.71 10/17/2022   Lab Results  Component Value Date   GFR 91.85 10/17/2022   GFR 93.59 07/17/2022   GFR 93.83 03/07/2022    Plan: None. Will discuss medications at the next visit.  Continue all other medications.  Will keep all carbohydrates low both sweets and starches.  Will continue exercise regimen to 30 to 60 minutes on most days of the week.  Aim for 7 to 9 hours of sleep nightly.  Eat more low glycemic index foods.    Health Maintenance:   Obesity   Plan: EKG was done on 09/26/2022 and reviewed, indirect calorimetry, and labs done.     Vitamin D Deficiency Vitamin D is not at goal of 50.  Most recent vitamin D level was 37.2. She is at risk for vitamin D deficiency due to obesity.  She is  Lab Results  Component Value Date   VD25OH 37.2 09/04/2017    Plan: Will check for vitamin D deficiency.   Amber [name]  had a positive depression screening. Depression is commonly associated with obesity and often results in emotional eating behaviors. We will monitor this closely and work on CBT to help improve the non-hunger eating patterns. Referral to Psychology may be required if no improvement is seen as she continues in our clinic.    Amber [name] 12 deficiency:   She is taking B12 (oral). She has a history of B12 deficiency and had the following s/s in the past ( some numbness and tinging in the past.)  Plan:  Check Amber [name] 12 lab today.    Previous labs reviewed today. Date: 10/17/22 HgbA1c, Vit B12, and BMP, and glucose  Labs done today CMP, Lipids, Insulin, Vit D, Thyroid Panel, Anemia Panel, Ferritin, and Microalbumin   Morbid Obesity: BMI (Calculated): 41.09   Jacee is currently in the action stage of change and her goal is to begin weight loss efforts. I recommend Luvina begin the structured treatment plan as follows:  She has  agreed to Category 2 Plan  Exercise goals: All adults should avoid inactivity. Some activity is better than none, and adults who participate in any amount of physical activity, gain some health benefits.  Behavioral modification strategies:increasing lean protein intake, increasing vegetables, decreasing sodium intake, no skipping meals, better snacking choices, avoiding temptations, and planning for success  She was informed of the importance of frequent follow-up visits to maximize her success with intensive lifestyle modifications for her multiple health conditions. She was informed we would discuss her lab results at her next visit unless there is a critical issue that needs to be addressed sooner. Roda agreed to keep her next visit at the agreed upon time to discuss these results.  Objective:  General: Cooperative, alert, well developed, in no acute distress. HEENT: Conjunctivae and lids unremarkable. Cardiovascular: Regular rhythm.  Lungs: Normal work of  breathing. Neurologic: No focal deficits.   Lab Results  Component Value Date   CREATININE 0.71 10/17/2022   BUN 13 10/17/2022   NA 139 10/17/2022   K 3.6 10/17/2022   CL 103 10/17/2022   CO2 29 10/17/2022   Lab Results  Component Value Date   ALT 14 09/20/2022   AST 13 09/20/2022   ALKPHOS 50 07/17/2022   BILITOT 0.5 09/20/2022   Lab Results  Component Value Date   HGBA1C 6.7 (H) 10/17/2022   HGBA1C 6.7 (H) 07/17/2022   HGBA1C 6.6 (H) 03/07/2022   HGBA1C 6.5 11/07/2021   HGBA1C 6.5 08/07/2021   Lab Results  Component Value Date   INSULIN 16.5 09/04/2017   Lab Results  Component Value Date   TSH 1.05 08/29/2020   Lab Results  Component Value Date   CHOL 126 07/17/2022   HDL 49.30 07/17/2022   LDLCALC 57 07/17/2022   TRIG 98.0 07/17/2022   CHOLHDL 3 07/17/2022   Lab Results  Component Value Date   WBC 5.9 09/25/2022   HGB 13.5 09/25/2022   HCT 43.6 09/25/2022   MCV 86.3 09/25/2022   PLT 261 09/25/2022   Lab Results  Component Value Date   IRON 87 07/17/2022   TIBC 302 07/17/2022   FERRITIN 73 07/17/2022    Attestation Statements:  Applicable history such as the following:  allergies, medications, problem list, medical history, surgical history, family history, social history, and previous encounter notes reviewed by clinician on day of visit:   Time spent on visit including the items listed below was 55 minutes.  -preparing to see the patient (e.g., review of tests, history, previous notes) -obtaining and/or reviewing separately obtained history -counseling and educating the patient/family/caregiver -documenting clinical information in the electronic or other health record -ordering medications, tests, or procedures -independently interpreting results and communicating results to the patient/ family/caregiver -referring and communicating with other health care professionals  -care coordination   This may have been prepared with the assistance of  Engineer, civil (consulting).  Occasional wrong-word or sound-a-like substitutions may have occurred due to the inherent limitations of voice recognition software.    Corinna Capra, DO

## 2022-12-06 LAB — LIPID PANEL WITH LDL/HDL RATIO
Cholesterol, Total: 120 mg/dL (ref 100–199)
HDL: 54 mg/dL (ref >39)
LDL Chol Calc (NIH): 49 mg/dL (ref 0–99)
LDL/HDL Ratio: 0.9 {ratio} (ref 0.0–3.2)
Triglycerides: 85 mg/dL (ref 0–149)
VLDL Cholesterol Cal: 17 mg/dL (ref 5–40)

## 2022-12-06 LAB — TSH+T4F+T3FREE
Free T4: 1.33 ng/dL (ref 0.82–1.77)
T3, Free: 3.4 pg/mL (ref 2.0–4.4)
TSH: 1.56 u[IU]/mL (ref 0.450–4.500)

## 2022-12-06 LAB — ANEMIA PANEL
Ferritin: 120 ng/mL (ref 15–150)
Folate, Hemolysate: 388 ng/mL
Folate, RBC: 970 ng/mL (ref >498)
Hematocrit: 40 % (ref 34.0–46.6)
Iron Saturation: 30 % (ref 15–55)
Iron: 92 ug/dL (ref 27–139)
Retic Ct Pct: 1.4 % (ref 0.6–2.6)
Total Iron Binding Capacity: 303 ug/dL (ref 250–450)
UIBC: 211 ug/dL (ref 118–369)
Vitamin B-12: >2000 pg/mL — ABNORMAL HIGH (ref 232–1245)

## 2022-12-06 LAB — COMPREHENSIVE METABOLIC PANEL
ALT: 17 [IU]/L (ref 0–32)
AST: 21 [IU]/L (ref 0–40)
Albumin: 4.3 g/dL (ref 3.9–4.9)
Alkaline Phosphatase: 60 [IU]/L (ref 44–121)
BUN/Creatinine Ratio: 15 (ref 12–28)
BUN: 12 mg/dL (ref 8–27)
Bilirubin Total: 0.8 mg/dL (ref 0.0–1.2)
CO2: 24 mmol/L (ref 20–29)
Calcium: 9.8 mg/dL (ref 8.7–10.3)
Chloride: 100 mmol/L (ref 96–106)
Creatinine, Ser: 0.81 mg/dL (ref 0.57–1.00)
Globulin, Total: 4.5 g/dL (ref 1.5–4.5)
Glucose: 81 mg/dL (ref 70–99)
Potassium: 3.8 mmol/L (ref 3.5–5.2)
Sodium: 137 mmol/L (ref 134–144)
Total Protein: 8.8 g/dL — ABNORMAL HIGH (ref 6.0–8.5)
eGFR: 83 mL/min/{1.73_m2} (ref >59)

## 2022-12-06 LAB — MICROALBUMIN / CREATININE URINE RATIO
Creatinine, Urine: 62.1 mg/dL
Microalb/Creat Ratio: 6 mg/g{creat} (ref 0–29)
Microalbumin, Urine: 3.8 ug/mL

## 2022-12-06 LAB — INSULIN, RANDOM: INSULIN: 17.5 u[IU]/mL (ref 2.6–24.9)

## 2022-12-06 LAB — VITAMIN D 25 HYDROXY (VIT D DEFICIENCY, FRACTURES): Vit D, 25-Hydroxy: 29.4 ng/mL — ABNORMAL LOW (ref 30.0–100.0)

## 2022-12-06 NOTE — Progress Notes (Unsigned)
Office Visit Note  Patient: Amber [name]             Date of Birth: 1960-02-09           MRN: [id number]             PCP: Sandford Craze, NP Referring: Sandford Craze, NP Visit Date: 12/20/2022 Occupation: @GUAROCC @  Subjective:  Medication monitoring   History of Present Illness: Amber [name] is a 61 y.o. female with history of seropositive rheumatoid arthritis, Sjogren's syndrome, and osteoarthritis.  Patient remains on  Orencia 125 mg subcutaneous every 10 days.  She continues to tolerate Orencia without any side effects or injection site reactions.  Patient has noticed a significant improvement in her symptoms since reinitiating Orencia.  She continues to have chronic pain in her right knee and is expecting to undergo arthroscopic surgery for the right knee on 01/16/2023 with Dr. Luiz Blare.  She has been taking Aleve as needed for pain relief.  She denies any joint swelling at this time. Patient continues to have chronic sicca symptoms.  She has been seeing the ophthalmologist and dentist every 6 months as advised.  She denies any other new medical conditions.  She denies any recent or recurrent infections.  Activities of Daily Living:  Patient reports morning stiffness for 5-10 minutes.   Patient Reports nocturnal pain.  Difficulty dressing/grooming: Denies Difficulty climbing stairs: Reports Difficulty getting out of chair: Reports Difficulty using hands for taps, buttons, cutlery, and/or writing: Reports  Review of Systems  Constitutional:  Positive for fatigue.  HENT:  Positive for mouth dryness and nose dryness. Negative for mouth sores.   Eyes:  Positive for pain and dryness.  Respiratory:  Positive for shortness of breath. Negative for cough and wheezing.   Cardiovascular:  Positive for palpitations. Negative for chest pain.  Gastrointestinal:  Negative for blood in stool, constipation and diarrhea.  Endocrine: Negative for increased urination.   Genitourinary:  Negative for involuntary urination.  Musculoskeletal:  Positive for joint pain, joint pain, joint swelling, myalgias, morning stiffness, muscle tenderness and myalgias. Negative for gait problem and muscle weakness.  Skin:  Positive for hair loss. Negative for color change, rash and sensitivity to sunlight.  Allergic/Immunologic: Positive for susceptible to infections.  Neurological:  Positive for headaches. Negative for dizziness.  Hematological:  Negative for swollen glands.  Psychiatric/Behavioral:  Positive for sleep disturbance. Negative for depressed mood. The patient is not nervous/anxious.     PMFS History:  Patient Active Problem List   Diagnosis Date Noted   Vitamin D insufficiency 12/10/2022   Preventative health care 07/17/2022   Peripheral neuropathy 07/17/2022   Need for immunization against poliomyelitis 05/01/2022   High risk HPV infection 10/17/2020   Cerebrovascular disease 10/13/2020   Colon polyps 09/26/2020   GERD (gastroesophageal reflux disease) 09/26/2020   B12 deficiency 05/03/2020   Hyperlipidemia 05/03/2020   Asthma 05/03/2020   Diabetic peripheral neuropathy (HCC) 01/05/2020   Class 2 severe obesity with serious comorbidity and body mass index (BMI) of 38.0 to 38.9 in adult, unspecified obesity type (HCC) 01/05/2020   Adrenal mass, right (HCC) 01/05/2020   Hypertensive heart disease 09/04/2017   Controlled type 2 diabetes mellitus without complication, without long-term current use of insulin (HCC) 05/31/2017   Chronic diastolic CHF (congestive heart failure) (HCC) 04/24/2017   CAD (coronary artery disease) 04/24/2017   History of BCG vaccination 06/14/2016   Fibroids 07/27/2014   Migraine 04/19/2014   Elevated serum protein level 04/09/2014  Encounter for counseling regarding immunization 04/09/2014   Iron deficiency anemia 09/02/2013   Obesity (BMI 30-39.9) 10/17/2012   Glaucoma 11/07/2011   Rheumatoid arthritis (HCC) 11/07/2011    Essential hypertension 11/05/2011   Sjogren's disease (HCC) 11/05/2011   Anemia 09/22/2010   Fibroid uterus 09/22/2010    Past Medical History:  Diagnosis Date   Anemia    Arthritis    Asthma 05/03/2020   Back pain    Blood transfusion without reported diagnosis    CAD (coronary artery disease) 04/24/2017   Cataract    Cerebrovascular disease 10/13/2020   Chronic diastolic CHF (congestive heart failure) (HCC) 04/24/2017   Colon polyps    Controlled type 2 diabetes mellitus without complication, without long-term current use of insulin (HCC) 05/31/2017   Diabetic peripheral neuropathy (HCC) 01/05/2020   Ectopic pregnancy    RIGHT salpingostomy   Elective abortion    Elective abortion 09/26/2020   RIGHT salpingostomy   Essential hypertension 09/04/2017   GERD (gastroesophageal reflux disease)    Glaucoma    both eyes   Headache    migraines   History of chicken pox    History of chicken pox 09/26/2020   migraines   Hyperlipemia    Hypertension    Iron deficiency anemia 09/02/2013   Joint pain    Migraine 04/19/2014   Obesity (BMI 30-39.9) 10/17/2012   Palpitations 10/01/2012   Pre-diabetes    Rheumatoid arthritis (HCC) 11/07/2011   Rheumatoid arthritis(714.0) 11/07/2011   SAB (spontaneous abortion)    SAB (spontaneous abortion) 11/07/2011   Shortness of breath    Sjogren's disease (HCC)    Swelling of lower extremity    Vitamin B12 deficiency    Wheezing     Family History  Problem Relation Age of Onset   Hypertension Mother        died from heart disease   Heart disease Mother        deceased, unknown cause   Stroke Mother    Obesity Mother    High Cholesterol Mother    Sudden death Father    Diabetes Sister    Hypertension Sister    Esophageal cancer Sister    Stomach cancer Sister    Cancer Sister    Diabetes Sister    Parkinson's disease Brother    Rectal cancer Neg Hx    Colon cancer Neg Hx    Colon polyps Neg Hx    Past Surgical History:   Procedure Laterality Date   ABDOMINAL SURGERY     bowel repair   COLONOSCOPY     COLONOSCOPY  08/2022   DILATION AND CURETTAGE OF UTERUS     X 2   MENISCUS REPAIR Right    MENISCUS TEAR REPAIR   MYOMECTOMY N/A 07/27/2014   Procedure: ABDOMINAL MYOMECTOMY;  Surgeon: Hoover Browns, MD;  Location: [facility];  Service: Gynecology;  Laterality: N/A;   PELVIC LAPAROSCOPY  1994   IN 1999 LAPAROSCOPY WITH INCIDENTAL ENTEROTOMY REQUIRING LAPAROTOMY   ROTATOR CUFF REPAIR Left 06/21/2021   UTERINE FIBROID SURGERY     July 2016   Social History   Social History Narrative   Regular exercise:  No   Caffeine Use: 1 cup coffee daily   No children   "Happily divorced"   Reports that she is involved at her church   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  Had a sister up in [place] who passed away 2016/01/14 who she was close to.  Born in [place]- she came to [place] as adult.  Age 56.    Left-handed.         Immunization History  Administered Date(s) Administered   Hepatitis A, Adult 11/01/2017, 12/03/2018   IPV 11/01/2017, 05/01/2022   Influenza Split 11/07/2011   Influenza, Seasonal, Injecte, Preservative Fre 09/24/2022   Influenza,inj,Quad PF,6+ Mos 10/17/2012, 01/15/2014, 08/31/2014, 09/30/2017, 09/10/2018, 09/21/2019, 10/07/2020, 11/07/2021   Meningococcal Mcv4o 11/01/2017   Moderna Sars-Covid-2 Vaccination 01/28/2019, 02/27/2019, 11/27/2019   Pfizer(Comirnaty)Fall Seasonal Vaccine 12 years and older 10/03/2020   Pneumococcal Conjugate-13 08/05/2020   Pneumococcal Polysaccharide-23 09/10/2018   Td 07/17/2022   Tdap 06/01/2011, 11/25/2012   Typhoid Live 11/01/2017   Zoster Recombinant(Shingrix) 10/04/2017, 12/04/2017     Objective: Vital Signs: BP 126/82 (BP Location: Left Arm, Patient Position: Sitting, Cuff Size: Large)   Pulse 65   Ht 5\' 1"  (1.549 m)   Wt 209 lb (94.8 kg)   LMP 08/09/2016   BMI 39.49 kg/m    Physical Exam Vitals and nursing note reviewed.   Constitutional:      Appearance: She is well-developed.  HENT:     Head: Normocephalic and atraumatic.  Eyes:     Conjunctiva/sclera: Conjunctivae normal.  Cardiovascular:     Rate and Rhythm: Normal rate and regular rhythm.     Heart sounds: Normal heart sounds.  Pulmonary:     Effort: Pulmonary effort is normal.     Breath sounds: Normal breath sounds.  Abdominal:     General: Bowel sounds are normal.     Palpations: Abdomen is soft.  Musculoskeletal:     Cervical back: Normal range of motion.  Lymphadenopathy:     Cervical: No cervical adenopathy.  Skin:    General: Skin is warm and dry.     Capillary Refill: Capillary refill takes less than 2 seconds.  Neurological:     Mental Status: She is alert and oriented to person, place, and time.  Psychiatric:        Behavior: Behavior normal.      Musculoskeletal Exam: C-spine has limited range of motion especially with lateral rotation to the left.  Left trapezius muscle tension and tenderness noted.  Shoulder joints, elbow joints, wrist joints, MCPs, PIPs, DIPs have good range of motion with no synovitis.  Complete fist formation bilaterally.  Hip joints have good range of motion with no groin pain.  Knee joints have good range of motion no warmth or effusion.  Ankle joints have good range of motion with no tenderness or joint swelling.  CDAI Exam: CDAI Score: -- Patient Global: --; Provider Global: -- Swollen: --; Tender: -- Joint Exam 12/20/2022   No joint exam has been documented for this visit   There is currently no information documented on the homunculus. Go to the Rheumatology activity and complete the homunculus joint exam.  Investigation: No additional findings.  Imaging: Nocturnal polysomnography Result Date: 12/16/2022 Huston Foley, MD     12/17/2022  5:39 PM Physician Interpretation:  [facility] POLYSOMNOGRAPHY  INTERPRETATION REPORT STUDY DATE:  12/16/2022  PATIENT  NAME:  Don Broach Packett        DATE OF BIRTH:  [birth year] PATIENT ID:  [id number]    TYPE OF STUDY:  PSG READING PHYSICIAN: Huston Foley, MD, PhD   SCORING TECHNICIAN: Margaretann Loveless, RPSGT Referred by: ER ? History and Indication for Testing: 62 year old female with an underlying complex medical history of coronary artery disease, congestive heart failure, hypertension, hyperlipidemia, anemia, rheumatoid arthritis,  Sjogren's disease, diabetes, diabetic neuropathy, migraine headaches, palpitations, asthma, and severe obesity with a BMI of over 40, who reports a longstanding history of recurrent headaches. She snores and does not wake up rested, she is tired and sleepy during the day. Epworth sleepiness score is 13 out of 24, fatigue severity score is 52 out of 63.  Height: 61 in Weight: 212 lb (BMI 40) Neck Size: 15 in  MEDICATIONS: Abatacept, Albuterol, Aspirin, Vitamin D, Coreg, Vitamin B 12, Diltiazem, Flonase, Ibuprofen, Ferrous Sulfate, Imitrex, Timoptic, Travatan Z, Maxzide, Diovan  TECHNICAL DESCRIPTION: A registered sleep technologist was in attendance for the duration of the recording.  Data collection, scoring, video monitoring, and reporting were performed in compliance with the AASM Manual for the Scoring of Sleep and Associated Events; (Hypopnea is scored based on the criteria listed in Section VIII D. 1b in the AASM Manual V2.6 using a 4% oxygen desaturation rule or Hypopnea is scored based on the criteria listed in Section VIII D. 1a in the AASM Manual V2.6 using 3% oxygen desaturation and /or arousal rule). SLEEP CONTINUITY AND SLEEP ARCHITECTURE:  Lights-out was at 20:50: and lights-on at  05:00:, with a total recording time of 8 hours, 10 min. Total sleep time ( TST) was 375.0 minutes with a decreased sleep efficiency at 76.5%. There was  24.0% REM sleep.  BODY POSITION:  TST was divided  between the following sleep positions: 77.1% supine;  22.9% lateral;  0% prone. Duration of total sleep and  percent of total sleep in their respective position is as follows: supine 289 minutes (77%), non-supine 86 minutes (23%); right 86 minutes (23%), left 00 minutes (0%), and prone 00 minutes (0%). Total supine REM sleep time was 36 minutes (40% of total REM sleep).  Sleep latency was increased at 74.0 minutes.  REM sleep latency was normal at 85.5 minutes. Of the total sleep time, the percentage of stage N1 sleep was 4.8%, stage N2 sleep was 64%, which is increased, stage N3 sleep was 7.1%, and REM sleep was 24.0%, which is normal. Wake after sleep onset (WASO) time accounted for 41 minutes with mild to moderate sleep fragmentation noted. RESPIRATORY MONITORING:  Based on CMS criteria (using a 4% oxygen desaturation rule for scoring hypopneas), there were 3 apneas (3 obstructive; 0 central; 0 mixed), and 133 hypopneas.  Apnea index was 0.5. Hypopnea index was 21.3. The apnea-hypopnea index was 21.8/hour overall (26.8 supine, 4 non-supine; 31.3 REM, 71.7 supine REM).  There were 0 respiratory effort-related arousals (RERAs).  The RERA index was 0 events/h. Total respiratory disturbance index (RDI) was 21.8 events/h. RDI results showed: supine RDI  26.8 /h; non-supine RDI 4.9 /h; REM RDI 31.3 /h, supine REM RDI 71.7 /h. Based on AASM criteria (using a 3% oxygen desaturation and /or arousal rule for scoring hypopneas), there were 3 apneas (3 obstructive; 0 central; 0 mixed), and 134 hypopneas. Apnea index was 0.5. Hypopnea index was 21.4. The apnea-hypopnea index was 21.9 overall (27.0 supine, 4 non-supine; 31.3 REM, 71.7 supine REM).  There were 0 respiratory effort-related arousals (RERAs).  The RERA index was 0 events/h. Total respiratory disturbance index (RDI) was 21.9 events/h. RDI results showed: supine RDI  27.0 /h; non-supine RDI 4.9 /h; REM RDI 31.3 /h, supine REM RDI 71.7 /h.  OXIMETRY: Oxyhemoglobin Saturation Nadir during sleep was at  83% from a mean of 96%.  Of the Total sleep time (TST)   hypoxemia  (=<88%) was present for  0.6 minutes, or 0.1% of  total sleep time. LIMB MOVEMENTS: There were 0 periodic limb movements of sleep (0.0/hr), of which 0 (0.0/hr) were associated with an arousal.  AROUSAL: There were 140 arousals in total, for an arousal index of 22 arousals/hour.  Of these, 86 were identified as respiratory-related arousals (14 /h), 0 were PLM-related arousals (0 /h), and 76 were non-specific arousals (12 /h).  EEG: Review of the EEG showed no abnormal electrical discharges and symmetrical bihemispheric findings.  EKG: The EKG revealed normal sinus rhythm (NSR). The average heart rate during sleep was 63 bpm. AUDIO/VIDEO REVIEW: The audio and video review did not show any abnormal or unusual behaviors, movements, phonations or vocalizations. The patient took 1 restroom break. Snoring was noted, in the mild to moderate range. POST-STUDY QUESTIONNAIRE: Post study, the patient indicated, that sleep was the same as usual. IMPRESSION: 1. Moderate Obstructive Sleep Apnea (OSA) 2. Dysfunctions associated with sleep stages or arousal from sleep RECOMMENDATIONS: 1. This study demonstrates moderate to severe obstructive sleep apnea, with a total AHI of 21.8/hour, REM AHI of 31.3/hour, supine AHI of 26.8/hour and O2 nadir of 83% (during supine REM sleep). Treatment with a positive airway pressure (PAP) device is recommended.  The patient will be advised to proceed with home autoPAP therapy for now.  A laboratory attended, full night PAP-titration study can be considered down the road, to optimize therapy settings, mask fit, monitoring of tolerance and of proper oxygen saturations. Other treatment options may be limited, and may include (generally speaking) surgical options in selected patients. Concomitant weight loss is highly recommended. Please note that untreated obstructive sleep apnea may carry additional perioperative morbidity. Patients with significant obstructive sleep apnea should receive perioperative  PAP therapy and the surgeons and particularly the anesthesiologist should be informed of the diagnosis and the severity of the sleep disordered breathing. 2. This study shows some sleep fragmentation and mildly abnormal sleep stage percentages; these are nonspecific findings and per se do not signify an intrinsic sleep disorder or a cause for the patient's sleep-related symptoms. Causes include (but are not limited to) the first night effect of the sleep study, circadian rhythm disturbances, medication effect or an underlying mood disorder or medical problem. 3. The patient should be cautioned not to drive, work at heights, or operate dangerous or heavy equipment when tired or sleepy. Review and reiteration of good sleep hygiene measures should be pursued with any patient. 4. The patient will be seen in follow-up in the sleep clinic at [facility] for discussion of the test results, symptom and treatment compliance review, further management strategies, etc. The patient and the referring provider will be notified of the test results.  I certify that I have reviewed the entire raw data recording prior to the issuance of this report in accordance with the Standards of Accreditation of the American Academy of Sleep Medicine (AASM). Huston Foley, MD, PhD Medical Director, [facility]) Diplomat, ABPN (Neurology and Sleep)    Technical Report: General Information Name: Hether, Anselmo BMI: 40.06 Physician: ,  ID: [id number] Height: 61.0 in Technician: Margaretann Loveless Sex: Female Weight: 212.0 lb Record: [id number] Age: 58 [05/11/1960] Date: 12/16/2022 Scorer: Margaretann Loveless Medical & Medication History   62 year old female with an underlying complex medical history of coronary artery disease, congestive heart failure, hypertension, hyperlipidemia, anemia, rheumatoid arthritis, Sjogren's disease, diabetes, diabetic neuropathy, migraine headaches, palpitations, asthma, and severe obesity  with a BMI of over 40, who reports a longstanding history of recurrent headaches including chronic migraines.  She snores and does not wake up rested, she is tired and sleepy during the day. Abatacept, Albuterol, Aspirin, Vitamin D, Coreg, Vitamin B 12, Diltiazem, Flonase, Ibuprofen, Ferrous Sulfate, Imitrex, Timoptic, Travatan Z, Maxzide, Diovan  Sleep Disorder    Comments  The patient came into the sleep lab for a PSG. One restroom break. EKG did not show any obvious cardiac arrhythmias. Mild to moderate snoring. Respiratory events scored with a 4% desat. Majority of respiratory events while supine. The patient slept supine and lateral. AHI was 33.2 after 2hrs of TST. All sleep stages witnessed.   Lights out: 08:50:56 PM Lights on: 05:00:39 AM Time Total Supine Side Prone Upright Recording (TRT) 8h 10.72m 5h 54.68m 2h 15.75m 0h 0.58m 0h 0.73m Sleep (TST) 6h 15.87m 4h 49.5m 1h 26.39m 0h 0.39m 0h 0.27m Latency N1 N2 N3 REM Onset Per. Slp. Eff. Actual 0h 0.91m 0h 9.92m 1h 56.8m 1h 25.4m 1h 14.28m 1h 21.34m 76.53% Stg Dur Wake N1 N2 N3 REM Total 115.0 18.0 240.5 26.5 90.0 Supine 65.5 15.5 211.0 26.5 36.0 Side 49.5 2.5 29.5 0.0 54.0 Prone 0.0 0.0 0.0 0.0 0.0 Upright 0.0 0.0 0.0 0.0 0.0  Stg % Wake N1 N2 N3 REM Total 23.5 4.8 64.1 7.1 24.0 Supine 13.4 4.1 56.3 7.1 9.6 Side 10.1 0.7 7.9 0.0 14.4 Prone 0.0 0.0 0.0 0.0 0.0 Upright 0.0 0.0 0.0 0.0 0.0  Apnea Summary Sub Supine Side Prone Upright Total 3 Total 3 3 0 0 0   REM 0 0 0 0 0   NREM 3 3 0 0 0 Obs 3 REM 0 0 0 0 0   NREM 3 3 0 0 0 Mix 0 REM 0 0 0 0 0   NREM 0 0 0 0 0 Cen 0 REM 0 0 0 0 0   NREM 0 0 0 0 0 Rera Summary Sub Supine Side Prone Upright Total 0 Total 0 0 0 0 0   REM 0 0 0 0 0   NREM 0 0 0 0 0  Hypopnea Summary Sub Supine Side Prone Upright Total 134 Total 134 127 7 0 0   REM 47 43 4 0 0   NREM 87 84 3 0 0 4% Hypopnea Summary Sub Supine Side Prone Upright Total (4%) 133 Total 133 126 7 0 0   REM 47 43 4 0 0   NREM 86 83 3 0 0  AHI Total Obs Mix Cen 21.92 Apnea 0.48 0.48  0.00 0.00  Hypopnea 21.44 -- -- -- 21.76 Hypopnea (4%) 21.28 -- -- --  Total Supine Side Prone Upright Position AHI 21.92 26.99 4.88 0.00 0.00 REM AHI 31.33  NREM AHI 18.95  Position RDI 21.92 26.99 4.88 0.00 0.00 REM RDI 31.33  NREM RDI 18.95  4% Hypopnea Total Supine Side Prone Upright Position AHI (4%) 21.76 26.78 4.88 0.00 0.00 REM AHI (4%) 31.33  NREM AHI (4%) 18.74  Position RDI (4%) 21.76 26.78 4.88 0.00 0.00 REM RDI (4%) 31.33  NREM RDI (4%) 18.74  Desaturation Information Threshold: 2% <100% <90% <80% <70% <60% <50% <40% Supine 281.0 8.0 0.0 0.0 0.0 0.0 0.0 Side 48.0 1.0 0.0 0.0 0.0 0.0 0.0 Prone 0.0 0.0 0.0 0.0 0.0 0.0 0.0 Upright 0.0 0.0 0.0 0.0 0.0 0.0 0.0 Total 329.0 9.0 0.0 0.0 0.0 0.0 0.0 Index 47.5 1.3 0.0 0.0 0.0 0.0 0.0 Threshold: 3% <100% <90% <80% <70% <60% <50% <40% Supine 200.0 8.0 0.0 0.0 0.0 0.0 0.0 Side 23.0 1.0 0.0 0.0 0.0  0.0 0.0 Prone 0.0 0.0 0.0 0.0 0.0 0.0 0.0 Upright 0.0 0.0 0.0 0.0 0.0 0.0 0.0 Total 223.0 9.0 0.0 0.0 0.0 0.0 0.0 Index 32.2 1.3 0.0 0.0 0.0 0.0 0.0 Threshold: 4% <100% <90% <80% <70% <60% <50% <40% Supine 130.0 8.0 0.0 0.0 0.0 0.0 0.0 Side 11.0 1.0 0.0 0.0 0.0 0.0 0.0 Prone 0.0 0.0 0.0 0.0 0.0 0.0 0.0 Upright 0.0 0.0 0.0 0.0 0.0 0.0 0.0 Total 141.0 9.0 0.0 0.0 0.0 0.0 0.0 Index 20.3 1.3 0.0 0.0 0.0 0.0 0.0 Threshold: 4% <100% <90% <80% <70% <60% <50% <40% Supine 130 8 0 0 0 0 0 Side 11 1 0 0 0 0 0 Prone 0 0 0 0 0 0 0 Upright 0 0 0 0 0 0 0 Total 141 9 0 0 0 0 0  Awakening/Arousal Information # of Awakenings 27 Wake after sleep onset 41.82m Wake after persistent sleep 34.69m Arousal Assoc. Arousals Index Apneas 3 0.5 Hypopneas 83 13.3 Leg Movements 0 0.0 Snore 0 0.0 PTT Arousals 0 0.0 Spontaneous 76 12.2 Total 161 25.8 Leg Movement Information PLMS LMs Index Total LMs during PLMS 0 0.0 LMs w/ Microarousals 0 0.0 LM LMs Index w/ Microarousal 0 0.0 w/ Awakening 0 0.0 w/ Resp Event 1 0.2 Spontaneous 1 0.2 Total 2 0.3  Desaturation threshold setting: 4% Minimum desaturation  setting: 10 seconds SaO2 nadir: 82% The longest event was a 85 sec obstructive Hypopnea with a minimum SaO2 of 93%. The lowest SaO2 was 82% associated with a 38 sec obstructive Hypopnea. EKG Rates EKG Avg Max Min Awake 68 92 56 Asleep 63 85 56 EKG Events: N/A   Recent Labs: Lab Results  Component Value Date   WBC 5.9 09/25/2022   HGB 13.5 09/25/2022   PLT 261 09/25/2022   NA 137 12/05/2022   K 3.8 12/05/2022   CL 100 12/05/2022   CO2 24 12/05/2022   GLUCOSE 81 12/05/2022   BUN 12 12/05/2022   CREATININE 0.81 12/05/2022   BILITOT 0.8 12/05/2022   ALKPHOS 60 12/05/2022   AST 21 12/05/2022   ALT 17 12/05/2022   PROT 8.8 (H) 12/05/2022   ALBUMIN 4.3 12/05/2022   CALCIUM 9.8 12/05/2022   GFRAA 115 02/19/2020   QFTBGOLD Negative 06/14/2016   QFTBGOLDPLUS NEGATIVE 02/16/2022    Speciality Comments: PLQ eye exam: 12/09/2018 normal. [facility].  Procedures:  No procedures performed Allergies: Gabapentin     Assessment / Plan:     Visit Diagnoses: Rheumatoid arthritis involving multiple sites with positive rheumatoid factor (HCC) - Positive RF, negative anti-CCP, -14 3 3  ETA, diagnosed 2010 by Dr. Oleta Mouse, later Dr. Dierdre Forth: She has no synovitis on examination today.  She has noticed a significant improvement in her arthralgias and joint stiffness since reinitiating Orencia on 09/20/22.  She is currently on Orencia 125 mg sq injections every 10 days.  She continues to tolerate Orencia without any side effects or injection site reactions.  Patient continues to have chronic pain involving her right knee and is scheduled for surgical intervention with Dr. Luiz Blare on 01/16/2023.  She is aware that she will require interruption in therapy prior to surgery as well as will require clearance by Dr. Luiz Blare to resume Orencia during the postoperative period. CBC with differential will be updated today.  She will remain on Orencia as prescribed for now.  She is advised to notify us if she develops  any signs or symptoms of a flare.  She will follow-up in the office in  3 months or sooner if needed.  High risk medication use - Orencia 125 mg subcutaneous every 10 days. Initially started on Orencia 01/28/20. (methotrexate was discontinued due to neutropenia).  Anemia panel and CMP updated on 12/05/22. CBC updated today.   TB gold negative on 02/16/22.  Future order placed today.  No recent or recurrent infections.  Discussed the importance of holding orencia if she develops signs or symptoms of an infection and to resume once the infection has completely cleared.  - Plan: CBC with Differential/Platelet, QuantiFERON-TB Gold Plus  Screening for tuberculosis -Future order for TB Gold placed today.  Plan: QuantiFERON-TB Gold Plus  Sjogren's syndrome with other organ involvement [facility]): Patient continues to have chronic sicca symptoms.  The symptoms have been stable.  She continues to see the dentist and ophthalmologist every 6 months as advised.  S/P rotator cuff repair left - June 21, 2021. Mild discomfort with ROM noted.   Primary osteoarthritis of both knees: Patient continues to have chronic pain involving her right knee.  Patient will be undergoing surgical intervention likely arthroscopic surgery on 01/16/2023 with Dr. Luiz Blare.  She has painful range of motion on examination today with warmth but no effusion.  Primary osteoarthritis of both feet: She is not experiencing any increased discomfort in her feet at this time.  She is good range of motion of both ankle joints with no active synovitis today.  Plantar fasciitis of right foot - Dr. Alma Friendly currently symptomatic.  Neck pain - Followed by Dr. Lucie Leather.  Limited range of motion with lateral rotation especially to the left.  Left trapezius muscle tension and tenderness.  A sample of a Salonpas patch was applied to the patient's left trapezius today in the office.   Other medical conditions are listed as follows:  Family history of  rheumatoid arthritis  Coronary artery disease involving native coronary artery of native heart without angina pectoris  Essential hypertension: Blood pressure was 126/82 today in the office.  Palpitations  Chronic diastolic CHF (congestive heart failure) (HCC)  Controlled type 2 diabetes mellitus without complication, without long-term current use of insulin (HCC)  Hx of migraines  Osteoporosis screening    Orders: Orders Placed This Encounter  Procedures   CBC with Differential/Platelet   QuantiFERON-TB Gold Plus   No orders of the defined types were placed in this encounter.     Follow-Up Instructions: Return in about 3 months (around 03/20/2023) for Rheumatoid arthritis, Sjogren's syndrome, Osteoarthritis.   Gearldine Bienenstock, PA-C  Note - This record has been created using Dragon software.  Chart creation errors have been sought, but may not always  have been located. Such creation errors do not reflect on  the standard of medical care.

## 2022-12-09 ENCOUNTER — Encounter: Payer: Self-pay | Admitting: Oncology

## 2022-12-10 ENCOUNTER — Encounter: Payer: Self-pay | Admitting: Bariatrics

## 2022-12-10 DIAGNOSIS — E559 Vitamin D deficiency, unspecified: Secondary | ICD-10-CM | POA: Insufficient documentation

## 2022-12-16 ENCOUNTER — Ambulatory Visit (INDEPENDENT_AMBULATORY_CARE_PROVIDER_SITE_OTHER): Payer: Managed Care, Other (non HMO) | Admitting: Neurology

## 2022-12-16 DIAGNOSIS — R519 Headache, unspecified: Secondary | ICD-10-CM

## 2022-12-16 DIAGNOSIS — Z72820 Sleep deprivation: Secondary | ICD-10-CM

## 2022-12-16 DIAGNOSIS — G4726 Circadian rhythm sleep disorder, shift work type: Secondary | ICD-10-CM

## 2022-12-16 DIAGNOSIS — G4733 Obstructive sleep apnea (adult) (pediatric): Secondary | ICD-10-CM | POA: Diagnosis not present

## 2022-12-16 DIAGNOSIS — Z9189 Other specified personal risk factors, not elsewhere classified: Secondary | ICD-10-CM

## 2022-12-16 DIAGNOSIS — G4720 Circadian rhythm sleep disorder, unspecified type: Secondary | ICD-10-CM

## 2022-12-16 DIAGNOSIS — G43719 Chronic migraine without aura, intractable, without status migrainosus: Secondary | ICD-10-CM

## 2022-12-16 DIAGNOSIS — R0683 Snoring: Secondary | ICD-10-CM

## 2022-12-17 ENCOUNTER — Ambulatory Visit: Payer: Managed Care, Other (non HMO) | Admitting: Family Medicine

## 2022-12-17 NOTE — Procedures (Signed)
Physician Interpretation:     [facility] POLYSOMNOGRAPHY  INTERPRETATION REPORT   STUDY DATE:  12/16/2022     PATIENT NAME:  Amber [name]         DATE OF BIRTH:  [birth year]  PATIENT ID:  [id number]    TYPE OF STUDY:  PSG  READING PHYSICIAN: Huston Foley, MD, PhD   SCORING TECHNICIAN: Margaretann Loveless, RPSGT   Referred by: ER ? History and Indication for Testing: 62 year old female with an underlying complex medical history of coronary artery disease, congestive heart failure, hypertension, hyperlipidemia, anemia, rheumatoid arthritis, Sjogren's disease, diabetes, diabetic neuropathy, migraine headaches, palpitations, asthma, and severe obesity with a BMI of over 40, who reports a longstanding history of recurrent headaches.  She snores and does not wake up rested, she is tired and sleepy during the day. Epworth sleepiness score is 13 out of 24, fatigue severity score is 52 out of 63.  Height: 61 in Weight: 212 lb (BMI 40) Neck Size: 15 in    MEDICATIONS: Abatacept, Albuterol, Aspirin, Vitamin D, Coreg, Vitamin B 12, Diltiazem, Flonase, Ibuprofen, Ferrous Sulfate, Imitrex, Timoptic, Travatan Z, Maxzide, Diovan   TECHNICAL DESCRIPTION: A registered sleep technologist was in attendance for the duration of the recording.  Data collection, scoring, video monitoring, and reporting were performed in compliance with the AASM Manual for the Scoring of Sleep and Associated Events; (Hypopnea is scored based on the criteria listed in Section VIII D. 1b in the AASM Manual V2.6 using a 4% oxygen desaturation rule or Hypopnea is scored based on the criteria listed in Section VIII D. 1a in the AASM Manual V2.6 using 3% oxygen desaturation and /or arousal rule).  SLEEP CONTINUITY AND SLEEP ARCHITECTURE:  Lights-out was at 20:50: and lights-on at  05:00:, with a total recording time of 8 hours, 10 min. Total sleep time ( TST) was 375.0 minutes with a decreased sleep  efficiency at 76.5%. There was  24.0% REM sleep.    BODY POSITION:  TST was divided  between the following sleep positions: 77.1% supine;  22.9% lateral;  0% prone. Duration of total sleep and percent of total sleep in their respective position is as follows: supine 289 minutes (77%), non-supine 86 minutes (23%); right 86 minutes (23%), left 00 minutes (0%), and prone 00 minutes (0%).  Total supine REM sleep time was 36 minutes (40% of total REM sleep).  Sleep latency was increased at 74.0 minutes.  REM sleep latency was normal at 85.5 minutes. Of the total sleep time, the percentage of stage N1 sleep was 4.8%, stage N2 sleep was 64%, which is increased, stage N3 sleep was 7.1%, and REM sleep was 24.0%, which is normal. Wake after sleep onset (WASO) time accounted for 41 minutes with mild to moderate sleep fragmentation noted.   RESPIRATORY MONITORING:   Based on CMS criteria (using a 4% oxygen desaturation rule for scoring hypopneas), there were 3 apneas (3 obstructive; 0 central; 0 mixed), and 133 hypopneas.  Apnea index was 0.5. Hypopnea index was 21.3. The apnea-hypopnea index was 21.8/hour overall (26.8 supine, 4 non-supine; 31.3 REM, 71.7 supine REM).  There were 0 respiratory effort-related arousals (RERAs).  The RERA index was 0 events/h. Total respiratory disturbance index (RDI) was 21.8 events/h. RDI results showed: supine RDI  26.8 /h; non-supine RDI 4.9 /h; REM RDI 31.3 /h, supine REM RDI 71.7 /h.   Based on AASM criteria (using a 3% oxygen desaturation and /or arousal rule for scoring hypopneas), there were  3 apneas (3 obstructive; 0 central; 0 mixed), and 134 hypopneas. Apnea index was 0.5. Hypopnea index was 21.4. The apnea-hypopnea index was 21.9 overall (27.0 supine, 4 non-supine; 31.3 REM, 71.7 supine REM).  There were 0 respiratory effort-related arousals (RERAs).  The RERA index was 0 events/h. Total respiratory disturbance index (RDI) was 21.9 events/h. RDI results showed: supine RDI   27.0 /h; non-supine RDI 4.9 /h; REM RDI 31.3 /h, supine REM RDI 71.7 /h.    OXIMETRY: Oxyhemoglobin Saturation Nadir during sleep was at  83% from a mean of 96%.  Of the Total sleep time (TST)   hypoxemia (=<88%) was present for  0.6 minutes, or 0.1% of total sleep time.  LIMB MOVEMENTS: There were 0 periodic limb movements of sleep (0.0/hr), of which 0 (0.0/hr) were associated with an arousal.   AROUSAL: There were 140 arousals in total, for an arousal index of 22 arousals/hour.  Of these, 86 were identified as respiratory-related arousals (14 /h), 0 were PLM-related arousals (0 /h), and 76 were non-specific arousals (12 /h).   EEG: Review of the EEG showed no abnormal electrical discharges and symmetrical bihemispheric findings.    EKG: The EKG revealed normal sinus rhythm (NSR). The average heart rate during sleep was 63 bpm.   AUDIO/VIDEO REVIEW: The audio and video review did not show any abnormal or unusual behaviors, movements, phonations or vocalizations. The patient took 1 restroom break. Snoring was noted, in the mild to moderate range.  POST-STUDY QUESTIONNAIRE: Post study, the patient indicated, that sleep was the same as usual.   IMPRESSION:  1. Moderate Obstructive Sleep Apnea (OSA) 2. Dysfunctions associated with sleep stages or arousal from sleep  RECOMMENDATIONS:  1. This study demonstrates moderate to severe obstructive sleep apnea, with a total AHI of 21.8/hour, REM AHI of 31.3/hour, supine AHI of 26.8/hour and O2 nadir of 83% (during supine REM sleep). Treatment with a positive airway pressure (PAP) device is recommended.  The patient will be advised to proceed with home autoPAP therapy for now.  A laboratory attended, full night PAP-titration study can be considered down the road, to optimize therapy settings, mask fit, monitoring of tolerance and of proper oxygen saturations. Other treatment options may be limited, and may include (generally speaking) surgical options in  selected patients. Concomitant weight loss is highly recommended. Please note that untreated obstructive sleep apnea may carry additional perioperative morbidity. Patients with significant obstructive sleep apnea should receive perioperative PAP therapy and the surgeons and particularly the anesthesiologist should be informed of the diagnosis and the severity of the sleep disordered breathing. 2. This study shows some sleep fragmentation and mildly abnormal sleep stage percentages; these are nonspecific findings and per se do not signify an intrinsic sleep disorder or a cause for the patient's sleep-related symptoms. Causes include (but are not limited to) the first night effect of the sleep study, circadian rhythm disturbances, medication effect or an underlying mood disorder or medical problem.  3. The patient should be cautioned not to drive, work at heights, or operate dangerous or heavy equipment when tired or sleepy. Review and reiteration of good sleep hygiene measures should be pursued with any patient. 4. The patient will be seen in follow-up in the sleep clinic at [facility] for discussion of the test results, symptom and treatment compliance review, further management strategies, etc. The patient and the referring provider will be notified of the test results.   I certify that I have reviewed the entire raw data recording prior to  the issuance of this report in accordance with the Standards of Accreditation of the American Academy of Sleep Medicine (AASM). Huston Foley, MD, PhD Medical Director, [facility]) Diplomat, ABPN (Neurology and Sleep)               Technical Report:   General Information  Name: Amber [name], Amber [name] BMI: 40.06 Physician: ,   ID: [id number] Height: 61.0 in Technician: Margaretann Loveless  Sex: Female Weight: 212.0 lb Record: [id number]  Age: 5 [Dec 20, 1960] Date: 12/16/2022 Scorer: Margaretann Loveless  Medical & Medication History     62 year old female with an underlying complex medical history of coronary artery disease, congestive heart failure, hypertension, hyperlipidemia, anemia, rheumatoid arthritis, Sjogren's disease, diabetes, diabetic neuropathy, migraine headaches, palpitations, asthma, and severe obesity with a BMI of over 40, who reports a longstanding history of recurrent headaches including chronic migraines. She snores and does not wake up rested, she is tired and sleepy during the day.  Abatacept, Albuterol, Aspirin, Vitamin D, Coreg, Vitamin B 12, Diltiazem, Flonase, Ibuprofen, Ferrous Sulfate, Imitrex, Timoptic, Travatan Z, Maxzide, Diovan   Sleep Disorder      Comments   The patient came into the sleep lab for a PSG. One restroom break. EKG did not show any obvious cardiac arrhythmias. Mild to moderate snoring. Respiratory events scored with a 4% desat. Majority of respiratory events while supine. The patient slept supine and lateral. AHI was 33.2 after 2hrs of TST. All sleep stages witnessed.     Lights out: 08:50:56 PM Lights on: 05:00:39 AM   Time Total Supine Side Prone Upright  Recording (TRT) 8h 10.39m 5h 54.32m 2h 15.76m 0h 0.19m 0h 0.1m  Sleep (TST) 6h 15.46m 4h 49.90m 1h 26.73m 0h 0.71m 0h 0.69m   Latency N1 N2 N3 REM Onset Per. Slp. Eff.  Actual 0h 0.87m 0h 9.10m 1h 56.37m 1h 25.27m 1h 14.40m 1h 21.33m 76.53%   Stg Dur Wake N1 N2 N3 REM  Total 115.0 18.0 240.5 26.5 90.0  Supine 65.5 15.5 211.0 26.5 36.0  Side 49.5 2.5 29.5 0.0 54.0  Prone 0.0 0.0 0.0 0.0 0.0  Upright 0.0 0.0 0.0 0.0 0.0   Stg % Wake N1 N2 N3 REM  Total 23.5 4.8 64.1 7.1 24.0  Supine 13.4 4.1 56.3 7.1 9.6  Side 10.1 0.7 7.9 0.0 14.4  Prone 0.0 0.0 0.0 0.0 0.0  Upright 0.0 0.0 0.0 0.0 0.0     Apnea Summary Sub Supine Side Prone Upright  Total 3 Total 3 3 0 0 0    REM 0 0 0 0 0    NREM 3 3 0 0 0  Obs 3 REM 0 0 0 0 0    NREM 3 3 0 0 0  Mix 0 REM 0 0 0 0 0    NREM 0 0 0 0 0  Cen 0 REM 0 0 0 0 0    NREM 0 0 0 0 0   Rera  Summary Sub Supine Side Prone Upright  Total 0 Total 0 0 0 0 0    REM 0 0 0 0 0    NREM 0 0 0 0 0   Hypopnea Summary Sub Supine Side Prone Upright  Total 134 Total 134 127 7 0 0    REM 47 43 4 0 0    NREM 87 84 3 0 0   4% Hypopnea Summary Sub Supine Side Prone Upright  Total (4%) 133 Total 133 126 7 0 0  REM 47 43 4 0 0    NREM 86 83 3 0 0     AHI Total Obs Mix Cen  21.92 Apnea 0.48 0.48 0.00 0.00   Hypopnea 21.44 -- -- --  21.76 Hypopnea (4%) 21.28 -- -- --    Total Supine Side Prone Upright  Position AHI 21.92 26.99 4.88 0.00 0.00  REM AHI 31.33   NREM AHI 18.95   Position RDI 21.92 26.99 4.88 0.00 0.00  REM RDI 31.33   NREM RDI 18.95    4% Hypopnea Total Supine Side Prone Upright  Position AHI (4%) 21.76 26.78 4.88 0.00 0.00  REM AHI (4%) 31.33   NREM AHI (4%) 18.74   Position RDI (4%) 21.76 26.78 4.88 0.00 0.00  REM RDI (4%) 31.33   NREM RDI (4%) 18.74    Desaturation Information Threshold: 2% <100% <90% <80% <70% <60% <50% <40%  Supine 281.0 8.0 0.0 0.0 0.0 0.0 0.0  Side 48.0 1.0 0.0 0.0 0.0 0.0 0.0  Prone 0.0 0.0 0.0 0.0 0.0 0.0 0.0  Upright 0.0 0.0 0.0 0.0 0.0 0.0 0.0  Total 329.0 9.0 0.0 0.0 0.0 0.0 0.0  Index 47.5 1.3 0.0 0.0 0.0 0.0 0.0   Threshold: 3% <100% <90% <80% <70% <60% <50% <40%  Supine 200.0 8.0 0.0 0.0 0.0 0.0 0.0  Side 23.0 1.0 0.0 0.0 0.0 0.0 0.0  Prone 0.0 0.0 0.0 0.0 0.0 0.0 0.0  Upright 0.0 0.0 0.0 0.0 0.0 0.0 0.0  Total 223.0 9.0 0.0 0.0 0.0 0.0 0.0  Index 32.2 1.3 0.0 0.0 0.0 0.0 0.0   Threshold: 4% <100% <90% <80% <70% <60% <50% <40%  Supine 130.0 8.0 0.0 0.0 0.0 0.0 0.0  Side 11.0 1.0 0.0 0.0 0.0 0.0 0.0  Prone 0.0 0.0 0.0 0.0 0.0 0.0 0.0  Upright 0.0 0.0 0.0 0.0 0.0 0.0 0.0  Total 141.0 9.0 0.0 0.0 0.0 0.0 0.0  Index 20.3 1.3 0.0 0.0 0.0 0.0 0.0   Threshold: 4% <100% <90% <80% <70% <60% <50% <40%  Supine 130 8 0 0 0 0 0  Side 11 1 0 0 0 0 0  Prone 0 0 0 0 0 0 0  Upright 0 0 0 0 0 0 0  Total 141 9 0 0 0 0 0    Awakening/Arousal Information # of Awakenings 27  Wake after sleep onset 41.95m  Wake after persistent sleep 34.64m   Arousal Assoc. Arousals Index  Apneas 3 0.5  Hypopneas 83 13.3  Leg Movements 0 0.0  Snore 0 0.0  PTT Arousals 0 0.0  Spontaneous 76 12.2  Total 161 25.8  Leg Movement Information PLMS LMs Index  Total LMs during PLMS 0 0.0  LMs w/ Microarousals 0 0.0   LM LMs Index  w/ Microarousal 0 0.0  w/ Awakening 0 0.0  w/ Resp Event 1 0.2  Spontaneous 1 0.2  Total 2 0.3     Desaturation threshold setting: 4% Minimum desaturation setting: 10 seconds SaO2 nadir: 82% The longest event was a 85 sec obstructive Hypopnea with a minimum SaO2 of 93%. The lowest SaO2 was 82% associated with a 38 sec obstructive Hypopnea. EKG Rates EKG Avg Max Min  Awake 68 92 56  Asleep 63 85 56  EKG Events: N/A

## 2022-12-17 NOTE — Addendum Note (Signed)
Addended by: Huston Foley on: 12/17/2022 05:41 PM   Modules accepted: Orders

## 2022-12-19 ENCOUNTER — Ambulatory Visit: Payer: Managed Care, Other (non HMO) | Admitting: Bariatrics

## 2022-12-20 ENCOUNTER — Encounter: Payer: Self-pay | Admitting: Physician Assistant

## 2022-12-20 ENCOUNTER — Ambulatory Visit: Payer: Managed Care, Other (non HMO) | Attending: Physician Assistant | Admitting: Physician Assistant

## 2022-12-20 VITALS — BP 126/82 | HR 65 | Ht 61.0 in | Wt 209.0 lb

## 2022-12-20 DIAGNOSIS — Z111 Encounter for screening for respiratory tuberculosis: Secondary | ICD-10-CM

## 2022-12-20 DIAGNOSIS — M19071 Primary osteoarthritis, right ankle and foot: Secondary | ICD-10-CM

## 2022-12-20 DIAGNOSIS — M0579 Rheumatoid arthritis with rheumatoid factor of multiple sites without organ or systems involvement: Secondary | ICD-10-CM

## 2022-12-20 DIAGNOSIS — M3509 Sicca syndrome with other organ involvement: Secondary | ICD-10-CM | POA: Diagnosis not present

## 2022-12-20 DIAGNOSIS — Z13820 Encounter for screening for osteoporosis: Secondary | ICD-10-CM

## 2022-12-20 DIAGNOSIS — I2510 Atherosclerotic heart disease of native coronary artery without angina pectoris: Secondary | ICD-10-CM

## 2022-12-20 DIAGNOSIS — Z8669 Personal history of other diseases of the nervous system and sense organs: Secondary | ICD-10-CM

## 2022-12-20 DIAGNOSIS — I10 Essential (primary) hypertension: Secondary | ICD-10-CM

## 2022-12-20 DIAGNOSIS — M542 Cervicalgia: Secondary | ICD-10-CM

## 2022-12-20 DIAGNOSIS — Z9889 Other specified postprocedural states: Secondary | ICD-10-CM | POA: Diagnosis not present

## 2022-12-20 DIAGNOSIS — I5032 Chronic diastolic (congestive) heart failure: Secondary | ICD-10-CM

## 2022-12-20 DIAGNOSIS — E119 Type 2 diabetes mellitus without complications: Secondary | ICD-10-CM

## 2022-12-20 DIAGNOSIS — M722 Plantar fascial fibromatosis: Secondary | ICD-10-CM

## 2022-12-20 DIAGNOSIS — Z79899 Other long term (current) drug therapy: Secondary | ICD-10-CM

## 2022-12-20 DIAGNOSIS — Z8261 Family history of arthritis: Secondary | ICD-10-CM

## 2022-12-20 DIAGNOSIS — R002 Palpitations: Secondary | ICD-10-CM

## 2022-12-20 DIAGNOSIS — M19072 Primary osteoarthritis, left ankle and foot: Secondary | ICD-10-CM

## 2022-12-20 DIAGNOSIS — M170 Bilateral primary osteoarthritis of knee: Secondary | ICD-10-CM

## 2022-12-20 LAB — CBC WITH DIFFERENTIAL/PLATELET
Absolute Lymphocytes: 2405 {cells}/uL (ref 850–3900)
Absolute Monocytes: 229 {cells}/uL (ref 200–950)
Basophils Absolute: 11 {cells}/uL (ref 0–200)
Basophils Relative: 0.3 %
Eosinophils Absolute: 100 {cells}/uL (ref 15–500)
Eosinophils Relative: 2.7 %
HCT: 40.2 % (ref 35.0–45.0)
Hemoglobin: 12.9 g/dL (ref 11.7–15.5)
MCH: 27.8 pg (ref 27.0–33.0)
MCHC: 32.1 g/dL (ref 32.0–36.0)
MCV: 86.6 fL (ref 80.0–100.0)
MPV: 11.1 fL (ref 7.5–12.5)
Monocytes Relative: 6.2 %
Neutro Abs: 955 {cells}/uL — ABNORMAL LOW (ref 1500–7800)
Neutrophils Relative %: 25.8 %
Platelets: 221 Thousand/uL (ref 140–400)
RBC: 4.64 Million/uL (ref 3.80–5.10)
RDW: 12.7 % (ref 11.0–15.0)
Total Lymphocyte: 65 %
WBC: 3.7 Thousand/uL — ABNORMAL LOW (ref 3.8–10.8)

## 2022-12-21 NOTE — Progress Notes (Signed)
WBC count is low-3.7.  absolute neutrophils are borderline low.  Rest of CBC WNL.  We will continue to monitor lab work closely.  The patient is spacing orencia by every 10 days.

## 2022-12-24 ENCOUNTER — Ambulatory Visit: Payer: Medicaid Other | Admitting: Family

## 2022-12-24 ENCOUNTER — Ambulatory Visit: Payer: Managed Care, Other (non HMO) | Admitting: Bariatrics

## 2022-12-25 ENCOUNTER — Ambulatory Visit: Payer: Managed Care, Other (non HMO) | Admitting: Bariatrics

## 2022-12-25 ENCOUNTER — Encounter: Payer: Self-pay | Admitting: Bariatrics

## 2022-12-25 VITALS — BP 123/75 | HR 66 | Temp 98.1°F | Ht 60.5 in | Wt 207.0 lb

## 2022-12-25 DIAGNOSIS — E559 Vitamin D deficiency, unspecified: Secondary | ICD-10-CM

## 2022-12-25 DIAGNOSIS — E119 Type 2 diabetes mellitus without complications: Secondary | ICD-10-CM

## 2022-12-25 DIAGNOSIS — Z6839 Body mass index (BMI) 39.0-39.9, adult: Secondary | ICD-10-CM | POA: Diagnosis not present

## 2022-12-25 DIAGNOSIS — Z7985 Long-term (current) use of injectable non-insulin antidiabetic drugs: Secondary | ICD-10-CM

## 2022-12-25 MED ORDER — TIRZEPATIDE 2.5 MG/0.5ML ~~LOC~~ SOAJ: 2.5000 mg | SUBCUTANEOUS | 0 refills | Status: DC

## 2022-12-25 MED ORDER — VITAMIN D (ERGOCALCIFEROL) 1.25 MG (50000 UNIT) PO CAPS: 50000.0000 [IU] | ORAL_CAPSULE | ORAL | 0 refills | Status: DC

## 2022-12-25 NOTE — Progress Notes (Signed)
First follow-up after initial visit.        WEIGHT SUMMARY AND BIOMETRICS  Weight Lost Since Last Visit: 7lb  Weight Gained Since Last Visit: 0   Vitals Temp: 98.1 F (36.7 C) BP: 123/75 Pulse Rate: 66 SpO2: 100 %   Anthropometric Measurements Height: 5' 0.5" (1.537 m) Weight: 207 lb (93.9 kg) BMI (Calculated): 39.75 Weight at Last Visit: 214lb Weight Lost Since Last Visit: 7lb Weight Gained Since Last Visit: 0 Starting Weight: 214lb Total Weight Loss (lbs): 7 lb (3.175 kg)   Body Composition  Body Fat %: 46.6 % Fat Mass (lbs): 96.4 lbs Muscle Mass (lbs): 105 lbs Total Body Water (lbs): 72.4 lbs Visceral Fat Rating : 15   Other Clinical Data Fasting: yes Labs: no Today's Visit #: 2 Starting Date: 12/05/22    OBESITY Amber [name] is here to discuss her progress with her obesity treatment plan along with follow-up of her obesity related diagnoses.    Nutrition Plan: the Category 2 plan - 50% adherence.  Current exercise: none  Interim History:  She is down 7 lbs since her first visit.  She is awaiting the results of her sleep study.  Eating all of the food on the plan., Protein intake is as prescribed, Is not skipping meals, and Water intake is adequate.  Initial positives regarding the dietary plan: She found the plan relatively easy to implement.  Initial challenges regarding  the dietary plan: She does not like lunch meat or cold cuts and has been using the meat that she had from the previous night for her middle meal.  Pharmacotherapy: Will start a GLP-1 Hunger is moderately controlled.  Cravings are moderately controlled.  Assessment/Plan:   Type II Diabetes HgbA1c is not at goal. Last A1c was 6.7.  She has type 2 diabetes but has not been started on any medication Episodes of hypoglycemia: no Medication(s): has not started on  any medication.  Lab Results  Component Value Date   HGBA1C 6.7 (H) 10/17/2022   HGBA1C 6.7 (H) 07/17/2022   HGBA1C 6.6 (H) 03/07/2022   Lab Results  Component Value Date   MICROALBUR 1.0 03/07/2022   LDLCALC 49 12/05/2022   CREATININE 0.81 12/05/2022   Lab Results  Component Value Date   GFR 91.85 10/17/2022   GFR 93.59 07/17/2022   GFR 93.83 03/07/2022    Plan: Start Mounjaro 2.5 mg SQ weekly Continue all other medications.  Will keep all carbohydrates low both sweets and starches.  Will continue exercise regimen to 30 to 60 minutes on most days of the week.  Aim for 7 to 9 hours of sleep nightly.  Eat more low glycemic index foods.   Vitamin D Deficiency Vitamin D is not at goal of 50.  Most recent vitamin D level was 29.4. She is not on any vitamin D at this time.  Lab Results  Component Value Date   VD25OH 29.4 (L) 12/05/2022   VD25OH 37.2 09/04/2017    Plan: Begin prescription vitamin D 50,000 IU weekly.    Morbid Obesity: Current BMI BMI (Calculated): 39.75   Pharmacotherapy Plan Start  Mounjaro 2.5 mg SQ weekly  Ciclaly is currently in the action stage of change. As such, her goal is to continue with weight loss efforts.  She has agreed to the Category 2 plan.  Exercise goals: All adults should avoid inactivity. Some physical activity is better than none, and adults who participate in any amount of physical activity gain some health benefits. 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  Information on [facility] well and phone number given  Behavioral modification strategies: increasing lean protein intake, better snacking choices, planning for success, emotional eating strategies, and keep healthy foods in the home.  Challis has agreed to follow-up with our clinic in 2 weeks.    Labs reviewed today from last visit (CMP, Lipids, HgbA1c, insulin, vitamin D, B 12, and thyroid panel).   Objective:   VITALS: Per patient if applicable, see  vitals. GENERAL: Alert and in no acute distress. CARDIOPULMONARY: No increased WOB. Speaking in clear sentences.  PSYCH: Pleasant and cooperative. Speech normal rate and rhythm. Affect is appropriate. Insight and judgement are appropriate. Attention is focused, linear, and appropriate.  NEURO: Oriented as arrived to appointment on time with no prompting.   Attestation Statements:   This was prepared with the assistance of Engineer, civil (consulting).  Occasional wrong-word or sound-a-like substitutions may have occurred due to the inherent limitations of voice recognition software.   Corinna Capra, DO

## 2022-12-31 ENCOUNTER — Telehealth (INDEPENDENT_AMBULATORY_CARE_PROVIDER_SITE_OTHER): Payer: Self-pay | Admitting: Bariatrics

## 2022-12-31 NOTE — Telephone Encounter (Signed)
12/30 pt requesting Manjaro refill. [facility]

## 2023-01-01 ENCOUNTER — Telehealth: Payer: Self-pay

## 2023-01-01 NOTE — Telephone Encounter (Signed)
 Mounjaro Approved 12/02/2022-01/01/2024 CaseId:[id number]

## 2023-01-08 ENCOUNTER — Encounter: Payer: Self-pay | Admitting: Bariatrics

## 2023-01-08 ENCOUNTER — Ambulatory Visit (INDEPENDENT_AMBULATORY_CARE_PROVIDER_SITE_OTHER): Payer: Managed Care, Other (non HMO) | Admitting: Bariatrics

## 2023-01-08 VITALS — BP 122/79 | HR 63 | Temp 98.1°F | Ht 60.5 in | Wt 206.0 lb

## 2023-01-08 DIAGNOSIS — Z6839 Body mass index (BMI) 39.0-39.9, adult: Secondary | ICD-10-CM | POA: Diagnosis not present

## 2023-01-08 DIAGNOSIS — E119 Type 2 diabetes mellitus without complications: Secondary | ICD-10-CM

## 2023-01-08 DIAGNOSIS — I10 Essential (primary) hypertension: Secondary | ICD-10-CM | POA: Diagnosis not present

## 2023-01-08 DIAGNOSIS — E66812 Obesity, class 2: Secondary | ICD-10-CM

## 2023-01-08 NOTE — Progress Notes (Signed)
 WEIGHT SUMMARY AND BIOMETRICS  Weight Lost Since Last Visit: 1lb  Weight Gained Since Last Visit: 0   Vitals Temp: 98.1 F (36.7 C) BP: 122/79 Pulse Rate: 63 SpO2: 100 %   Anthropometric Measurements Height: 5' 0.5 (1.537 m) Weight: 206 lb (93.4 kg) BMI (Calculated): 39.55 Weight at Last Visit: 207lb Weight Lost Since Last Visit: 1lb Weight Gained Since Last Visit: 0 Starting Weight: 214lb Total Weight Loss (lbs): 8 lb (3.629 kg)   Body Composition  Body Fat %: 47.4 % Fat Mass (lbs): 97.8 lbs Muscle Mass (lbs): 103 lbs Total Body Water (lbs): 73.2 lbs Visceral Fat Rating : 15   Other Clinical Data Fasting: yes Labs: no Today's Visit #: 3 Starting Date: 12/05/22    OBESITY Amber [name] is here to discuss her progress with her obesity treatment plan along with follow-up of her obesity related diagnoses.    Nutrition Plan: the Category 2 plan - 50% adherence.  Current exercise: none  Interim History:  She is down 1 lb since her last visit.  Eating all of the food on the plan., Protein intake is as prescribed, and Water intake is adequate.   Pharmacotherapy: [name] is on Mounjaro  2.5 mg SQ weekly (has Rx at home and has not started it yet.) Adverse side effects: None Has not started the medication Hunger is moderately controlled.  Cravings are moderately controlled. She states that weakness is sweets.  Assessment/Plan:   Type II Diabetes HgbA1c is at goal. Last A1c was 6.7 CBGs: Not checking      Episodes of hypoglycemia: no Medication(s): none. Will start Mounjaro  after the surgery.   Lab Results  Component Value Date   HGBA1C 6.7 (H) 10/17/2022   HGBA1C 6.7 (H) 07/17/2022   HGBA1C 6.6 (H) 03/07/2022   Lab Results  Component Value Date   MICROALBUR 1.0 03/07/2022   LDLCALC 49 12/05/2022   CREATININE 0.81 12/05/2022   Lab  Results  Component Value Date   GFR 91.85 10/17/2022   GFR 93.59 07/17/2022   GFR 93.83 03/07/2022    Plan: Continue Mounjaro  2.5 mg SQ weekly Will keep all carbohydrates low both sweets and starches.  Will continue exercise regimen to 30 to 60 minutes on most days of the week.  Aim for 7 to 9 hours of sleep nightly.  Eat more low glycemic index foods.  Handouts on high protein food were given.   Hypertension Hypertension stable.  Medication(s): Coreg  25 mg twice daily , Diltiazem  (Cardia XT) 240 mg 1 daily , and Diovan  320 mg  BP Readings from Last 3 Encounters:  01/08/23 122/79  12/25/22 123/75  12/20/22 126/82   Lab Results  Component Value Date   CREATININE 0.81 12/05/2022   CREATININE 0.71 10/17/2022   CREATININE 1.56 (H) 09/25/2022   Lab Results  Component Value Date   GFR 91.85 10/17/2022   GFR  93.59 07/17/2022   GFR 93.83 03/07/2022    Plan: Continue all antihypertensives at current dosages. No added salt. Will keep sodium content to 1,500 mg or less per day.     Morbid Obesity: Current BMI BMI (Calculated): 39.55   Pharmacotherapy Plan Continue  Mounjaro  2.5 mg SQ weekly Will resume after her surgery as per her surgeon's recommendation.   Amber [name] is currently in the action stage of change. As such, her goal is to continue with weight loss efforts.  She has agreed to the Category 2 plan.  Exercise goals: No exercise has been prescribed at this time. She will begin PT after her knee surgery and then progress per her surgeon's recommendation.  Behavioral modification strategies: no meal skipping, increase water intake, better snacking choices, planning for success, increasing vegetables, avoiding temptations, weigh protein portions, and mindful eating.  Amber [name] has agreed to follow-up with our clinic in 5 weeks.       Objective:   VITALS: Per patient if applicable, see vitals. GENERAL: Alert and in no acute distress. CARDIOPULMONARY: No  increased WOB. Speaking in clear sentences.  PSYCH: Pleasant and cooperative. Speech normal rate and rhythm. Affect is appropriate. Insight and judgement are appropriate. Attention is focused, linear, and appropriate.  NEURO: Oriented as arrived to appointment on time with no prompting.   Attestation Statements:   This was prepared with the assistance of Engineer, Civil (consulting).  Occasional wrong-word or sound-a-like substitutions may have occurred due to the inherent limitations of voice recognition   Clayborne Daring, DO

## 2023-01-10 NOTE — Telephone Encounter (Addendum)
 Contacted pt regarding SSR,went over [facility] message with pt. She was agreeable to plan.    Amber [name], CMA  Joylene, Bradley; Ziegler, [name]; Tucker, Dolanda; Cain, Leveda Sprung, [name] New orders have been placed for the above pt, DOB: 25-Feb-2060 Thanks New, [name], [name], CMA; New, Bradley; Ziegler, [name]; Tucker, Dolanda; Cain, Mitchell; 1 other Received, thank you!

## 2023-01-11 ENCOUNTER — Ambulatory Visit: Payer: Medicaid Other | Admitting: Family

## 2023-01-11 ENCOUNTER — Other Ambulatory Visit: Payer: Self-pay

## 2023-01-11 NOTE — Progress Notes (Signed)
 Specialty Pharmacy Refill Coordination Note  Amber [name] is a 63 y.o. female contacted today regarding refills of specialty medication(s) Abatacept  (Orencia  ClickJect)   Patient requested No data recorded  Delivery date: 01/15/23   Verified address: [address] [place], [place] [address]   Medication will be filled on 01/14/23.

## 2023-01-14 ENCOUNTER — Other Ambulatory Visit (HOSPITAL_COMMUNITY): Payer: Self-pay

## 2023-01-14 ENCOUNTER — Other Ambulatory Visit: Payer: Self-pay

## 2023-01-15 ENCOUNTER — Ambulatory Visit: Payer: Medicaid Other | Admitting: Family

## 2023-01-15 ENCOUNTER — Encounter: Payer: Self-pay | Admitting: Oncology

## 2023-01-15 ENCOUNTER — Other Ambulatory Visit (HOSPITAL_COMMUNITY): Payer: Self-pay

## 2023-01-16 ENCOUNTER — Other Ambulatory Visit: Payer: Self-pay

## 2023-01-16 ENCOUNTER — Other Ambulatory Visit (HOSPITAL_COMMUNITY): Payer: Self-pay

## 2023-01-16 HISTORY — PX: KNEE ARTHROSCOPY W/ MENISCAL REPAIR: SHX1877

## 2023-01-17 ENCOUNTER — Other Ambulatory Visit (HOSPITAL_COMMUNITY): Payer: Self-pay

## 2023-01-18 ENCOUNTER — Other Ambulatory Visit: Payer: Self-pay

## 2023-01-18 ENCOUNTER — Encounter: Payer: Self-pay | Admitting: Oncology

## 2023-01-18 ENCOUNTER — Other Ambulatory Visit (HOSPITAL_COMMUNITY): Payer: Self-pay

## 2023-01-21 ENCOUNTER — Other Ambulatory Visit: Payer: Self-pay

## 2023-01-21 ENCOUNTER — Ambulatory Visit: Payer: Medicaid Other | Admitting: Family

## 2023-01-22 ENCOUNTER — Other Ambulatory Visit: Payer: Self-pay

## 2023-01-23 ENCOUNTER — Other Ambulatory Visit: Payer: Self-pay

## 2023-01-25 ENCOUNTER — Ambulatory Visit (INDEPENDENT_AMBULATORY_CARE_PROVIDER_SITE_OTHER): Payer: Managed Care, Other (non HMO) | Admitting: Family

## 2023-01-25 ENCOUNTER — Other Ambulatory Visit: Payer: Self-pay

## 2023-01-25 VITALS — BP 111/66 | HR 80 | Resp 16 | Ht 60.5 in | Wt 210.0 lb

## 2023-01-25 DIAGNOSIS — E1142 Type 2 diabetes mellitus with diabetic polyneuropathy: Secondary | ICD-10-CM | POA: Diagnosis not present

## 2023-01-25 DIAGNOSIS — G43009 Migraine without aura, not intractable, without status migrainosus: Secondary | ICD-10-CM

## 2023-01-25 DIAGNOSIS — E538 Deficiency of other specified B group vitamins: Secondary | ICD-10-CM

## 2023-01-25 DIAGNOSIS — I10 Essential (primary) hypertension: Secondary | ICD-10-CM | POA: Diagnosis not present

## 2023-01-25 DIAGNOSIS — Z7985 Long-term (current) use of injectable non-insulin antidiabetic drugs: Secondary | ICD-10-CM | POA: Diagnosis not present

## 2023-01-25 DIAGNOSIS — I5032 Chronic diastolic (congestive) heart failure: Secondary | ICD-10-CM | POA: Diagnosis not present

## 2023-01-25 DIAGNOSIS — G4733 Obstructive sleep apnea (adult) (pediatric): Secondary | ICD-10-CM | POA: Insufficient documentation

## 2023-01-25 DIAGNOSIS — D500 Iron deficiency anemia secondary to blood loss (chronic): Secondary | ICD-10-CM | POA: Diagnosis not present

## 2023-01-25 DIAGNOSIS — M0579 Rheumatoid arthritis with rheumatoid factor of multiple sites without organ or systems involvement: Secondary | ICD-10-CM | POA: Diagnosis not present

## 2023-01-25 DIAGNOSIS — E119 Type 2 diabetes mellitus without complications: Secondary | ICD-10-CM

## 2023-01-25 LAB — HEMOGLOBIN A1C: Hgb A1c MFr Bld: 6.4 % (ref 4.6–6.5)

## 2023-01-25 MED ORDER — CYANOCOBALAMIN 1000 MCG/ML IJ SOLN
1000.0000 ug | Freq: Once | INTRAMUSCULAR | Status: AC
Start: 2023-01-25 — End: 2023-01-25
  Administered 2023-01-25: 1000 ug via INTRAMUSCULAR

## 2023-01-25 NOTE — Progress Notes (Signed)
01/25/23 CMA: Orencia  PA is required and Mills Koller was notified on 01/14/23. PA is still pending as of today's date.

## 2023-01-25 NOTE — Assessment & Plan Note (Signed)
Just started mounjaro which is being prescribed by [facility].  She is tolerating so far.  Lab Results  Component Value Date   HGBA1C 6.7 (H) 10/17/2022

## 2023-01-25 NOTE — Assessment & Plan Note (Signed)
Follows closely with Neuro. On Emgality.

## 2023-01-25 NOTE — Assessment & Plan Note (Signed)
Continues b12 injections.

## 2023-01-25 NOTE — Assessment & Plan Note (Signed)
New diagnosis- she is working neuro re: cpap.  Hopefully this will help with her headaches.

## 2023-01-25 NOTE — Assessment & Plan Note (Signed)
BP Readings from Last 3 Encounters:  01/25/23 111/66  01/08/23 122/79  12/25/22 123/75   At goal on diltiazem carvedilol and valsartan, continue same

## 2023-01-25 NOTE — Assessment & Plan Note (Signed)
Stable; unchanged

## 2023-01-25 NOTE — Assessment & Plan Note (Signed)
Stable on orencia. Followed by Dr. Corliss Skains.

## 2023-01-25 NOTE — Patient Instructions (Signed)
VISIT SUMMARY:  You had a follow-up appointment today to review your recovery after your recent knee surgery. We also discussed your ongoing management of several other health conditions, including hypertension, rheumatoid arthritis, weight management, sleep apnea, migraines, cataracts, vitamin B12 deficiency, and iron deficiency.  YOUR PLAN:  -MENISCUS TEAR: You recently had surgery to repair a meniscus tear and address arthritis in your right knee. Continue your recovery and follow the physical therapy plan as directed by your orthopedic surgeon, Dr. Luiz Blare.  -HYPERTENSION: Your blood pressure is well controlled with your current medications: Valsartan, Diltiazem, and Carvedilol. Continue taking these medications as prescribed.  -RHEUMATOID ARTHRITIS: Rheumatoid arthritis is an autoimmune condition that causes joint inflammation. Continue your treatment with Orencia and follow up with your rheumatologist as directed.  -WEIGHT MANAGEMENT: You are making progress with your weight loss program, although it is slow due to your limited ability to exercise post-surgery. Continue with the program and exercise as you are able.  -SLEEP APNEA: Sleep apnea is a condition where breathing stops and starts during sleep. You are scheduled to receive a CPAP machine soon. Continue your follow-up with neurology and start CPAP therapy when it becomes available.  -MIGRAINES: Migraines are severe headaches often accompanied by other symptoms. Continue taking Emgality and imitrex as directed by your neurologist.  -CATARACTS: Cataracts cause clouding of the eye's lens, leading to vision changes. Continue your follow-up with ophthalmology to discuss potential cataract surgery.  -VITAMIN B12 DEFICIENCY: Vitamin B12 deficiency can cause fatigue and other symptoms. You received your monthly B12 injection today.  -IRON DEFICIENCY: Iron deficiency can lead to anemia. Your recent labs showed normal iron levels. Continue  taking your iron supplements as directed.  INSTRUCTIONS:  Please follow up in 3 months. Additionally, make sure to check your A1c today.

## 2023-01-25 NOTE — Progress Notes (Signed)
Subjective:     Patient ID: Amber [name], female    DOB: 01-08-1960, 63 y.o.   MRN: [id number]  Chief Complaint  Patient presents with   Hypertension    Here for follow up   Diabetes    Here for follow up    B12 deficiency      Discussed the use of AI scribe software for clinical note transcription with the patient, who gave verbal consent to proceed.  History of Present Illness    The patient, with a history of meniscus tear in the right knee, presents for follow-up after recent knee surgery performed by Dr. Luiz Blare. She reports a history of a meniscus tear in 2009, which was managed with cortisone shots until recently when she experienced a worsening of symptoms after a fall. An MRI confirmed a loose meniscus and arthritis in the joint. The patient underwent surgery last Wednesday to repair the meniscus. She has been off work since January 6th and has been managing post-operative pain at home. She is currently using one crutch for mobility.  In addition to the knee pain, the patient is also managing multiple other health conditions. She is on a [facility] program [facility] which she reports is going well, though progress is slow due to limited ability to exercise post-surgery. She is also under the care of a rheumatologist for a RA and is on Orencia, though there have been issues with the hospital dispensing the medication. She also has Sjogren's syndrome, which causes dry mouth and eyes. She has been seeing a neurologist for migraines and is on Emgality and imitrex. She recently had a sleep study which confirmed sleep apnea and she is due to receive a CPAP machine. She also has glaucoma and cataracts, which are affecting her vision. She is on Mounjaro obesity and DM2 which was recently started by the weight loss clinic. She just started it, however she reports that she is tolerating it thus far. She is also on a regimen of valsartan, diltiazem, and  carvedilol for hypertension.          Health Maintenance Due  Topic Date Due   COVID-19 Vaccine (6 - 2024-25 season) 11/27/2022    Past Medical History:  Diagnosis Date   Anemia    Arthritis    Asthma 05/03/2020   Back pain    Blood transfusion without reported diagnosis    CAD (coronary artery disease) 04/24/2017   Cataract    Cerebrovascular disease 10/13/2020   Chronic diastolic CHF (congestive heart failure) (HCC) 04/24/2017   Colon polyps    Controlled type 2 diabetes mellitus without complication, without long-term current use of insulin (HCC) 05/31/2017   Diabetic peripheral neuropathy (HCC) 01/05/2020   Ectopic pregnancy    RIGHT salpingostomy   Elective abortion    Elective abortion 09/26/2020   RIGHT salpingostomy   Essential hypertension 09/04/2017   GERD (gastroesophageal reflux disease)    Glaucoma    both eyes   Headache    migraines   History of chicken pox    History of chicken pox 09/26/2020   migraines   Hyperlipemia    Hypertension    Iron deficiency anemia 09/02/2013   Joint pain    Migraine 04/19/2014   Obesity (BMI 30-39.9) 10/17/2012   Palpitations 10/01/2012   Pre-diabetes    Rheumatoid arthritis (HCC) 11/07/2011   Rheumatoid arthritis(714.0) 11/07/2011   SAB (spontaneous abortion)    SAB (spontaneous abortion) 11/07/2011   Shortness  of breath    Sjogren's disease (HCC)    Swelling of lower extremity    Vitamin B12 deficiency    Wheezing     Past Surgical History:  Procedure Laterality Date   ABDOMINAL SURGERY     bowel repair   COLONOSCOPY     COLONOSCOPY  08/2022   DILATION AND CURETTAGE OF UTERUS     X 2   MENISCUS REPAIR Right    MENISCUS TEAR REPAIR   MYOMECTOMY N/A 07/27/2014   Procedure: ABDOMINAL MYOMECTOMY;  Surgeon: Hoover Browns, MD;  Location: [facility];  Service: Gynecology;  Laterality: N/A;   PELVIC LAPAROSCOPY  1994   IN 1999 LAPAROSCOPY WITH INCIDENTAL ENTEROTOMY REQUIRING LAPAROTOMY   ROTATOR CUFF REPAIR Left  06/21/2021   UTERINE FIBROID SURGERY     July 2016    Family History  Problem Relation Age of Onset   Hypertension Mother        died from heart disease   Heart disease Mother        deceased, unknown cause   Stroke Mother    Obesity Mother    High Cholesterol Mother    Sudden death Father    Diabetes Sister    Hypertension Sister    Esophageal cancer Sister    Stomach cancer Sister    Cancer Sister    Diabetes Sister    Parkinson's disease Brother    Rectal cancer Neg Hx    Colon cancer Neg Hx    Colon polyps Neg Hx     Social History   Socioeconomic History   Marital status: Single    Spouse name: Not on file   Number of children: 0   Years of education: college   Highest education level: Associate degree: occupational, Scientist, product/process development, or vocational program  Occupational History   Occupation: LPN    Employer: [employer]  Tobacco Use   Smoking status: Never    Passive exposure: Never   Smokeless tobacco: Never  Vaping Use   Vaping status: Never Used  Substance and Sexual Activity   Alcohol use: No    Comment: occasional   Drug use: No   Sexual activity: Yes    Birth control/protection: None    Comment: 1st intercourse- 17, partners- 5  Other Topics Concern   Not on file  Social History Narrative   Regular exercise:  No   Caffeine Use: 1 cup coffee daily   No children   "Happily divorced"   Reports that she is involved at her church   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  Had a sister up in [place] who passed away 03-03-15 who she was close to.   Born in [place]- she came to [place] as adult.  Age 53.    Left-handed.         Social Drivers of Health   Financial Resource Strain: Medium Risk (01/25/2023)   Overall Financial Resource Strain (CARDIA)    Difficulty of Paying Living Expenses: Somewhat hard  Food Insecurity: No Food Insecurity (01/25/2023)   Hunger Vital Sign    Worried About Running Out of Food in the Last Year: Never true    Ran  Out of Food in the Last Year: Never true  Transportation Needs: No Transportation Needs (01/25/2023)   PRAPARE - Administrator, Civil Service (Medical): No    Lack of Transportation (Non-Medical): No  Physical Activity: Inactive (01/25/2023)   Exercise Vital Sign    Days  of Exercise per Week: 0 days    Minutes of Exercise per Session: 20 min  Stress: No Stress Concern Present (01/25/2023)   [employer] of Occupational Health - Occupational Stress Questionnaire    Feeling of Stress : Not at all  Social Connections: Moderately Integrated (01/25/2023)   Social Connection and Isolation Panel [NHANES]    Frequency of Communication with Friends and Family: More than three times a week    Frequency of Social Gatherings with Friends and Family: Once a week    Attends Religious Services: More than 4 times per year    Active Member of [employer] or Organizations: Yes    Attends Banker Meetings: More than 4 times per year    Marital Status: Divorced  Intimate Partner Violence: Unknown (04/04/2021)   Received from [employer]   HITS    Physically Hurt: Not on file    Insult or Talk Down To: Not on file    Threaten Physical Harm: Not on file    Scream or Curse: Not on file    Outpatient Medications Prior to Visit  Medication Sig Dispense Refill   Abatacept (ORENCIA CLICKJECT) 125 MG/ML SOAJ Inject 125 mg into the skin every 7 (seven) days. 4 mL 2   albuterol (VENTOLIN HFA) 108 (90 Base) MCG/ACT inhaler Inhale 2 puffs into the lungs every 6 (six) hours as needed. 18 g 0   aspirin EC 81 MG tablet Take 1 tablet (81 mg total) by mouth daily. 90 tablet 3   calcium-vitamin D (OSCAL WITH D) 500-200 MG-UNIT TABS tablet Take by mouth.     carvedilol (COREG) 25 MG tablet Take 1 tablet (25 mg total) by mouth 2 (two) times daily with a meal. 60 tablet 5   diltiazem (CARTIA XT) 240 MG 24 hr capsule Take 1 capsule (240 mg total) by mouth daily. 90 capsule 1    fluticasone (FLONASE) 50 MCG/ACT nasal spray Place 2 sprays into both nostrils daily. 16 g 1   Galcanezumab-gnlm (EMGALITY) 120 MG/ML SOAJ Inject 120 mg into the skin every 30 (thirty) days. 1.12 mL 5   ibuprofen (ADVIL) 200 MG tablet Take 200 mg by mouth every 6 (six) hours as needed.     Iron, Ferrous Sulfate, 325 (65 Fe) MG TABS Take 325 mg by mouth 2 (two) times daily. 30 tablet    rosuvastatin (CRESTOR) 10 MG tablet Take 10 mg by mouth daily.     SUMAtriptan (IMITREX) 100 MG tablet Take 1 tablet (100 mg total) by mouth as needed for migraine. May repeat in 2 hours if headache persists or recurs. 30 tablet 5   timolol (TIMOPTIC) 0.5 % ophthalmic solution 1 drop every morning.     tirzepatide [facility]) 2.5 MG/0.5ML Pen Inject 2.5 mg into the skin once a week. 2 mL 0   TRAVATAN Z 0.004 % SOLN ophthalmic solution Place 1 drop into both eyes at bedtime.      triamterene-hydrochlorothiazide (MAXZIDE-25) 37.5-25 MG tablet Take 1 tablet by mouth daily. 90 tablet 1   valsartan (DIOVAN) 320 MG tablet Take 1 tablet (320 mg total) by mouth daily. 30 tablet 5   Vitamin D, Ergocalciferol, (DRISDOL) 1.25 MG (50000 UNIT) CAPS capsule Take 1 capsule (50,000 Units total) by mouth every 7 (seven) days. 5 capsule 0   Cyanocobalamin (VITAMIN B-12) 5000 MCG LOZG Take 1 tablet by mouth 2 (two) times daily.     No facility-administered medications prior to visit.    Allergies  Allergen Reactions   Gabapentin     Made her "crazy"    ROS See HPI    Objective:    Physical Exam Constitutional:      General: She is not in acute distress.    Appearance: Normal appearance. She is well-developed.  HENT:     Head: Normocephalic and atraumatic.     Right Ear: External ear normal.     Left Ear: External ear normal.  Eyes:     General: No scleral icterus. Neck:     Thyroid: No thyromegaly.  Cardiovascular:     Rate and Rhythm: Normal rate and regular rhythm.     Heart sounds: Normal heart sounds. No  murmur heard. Pulmonary:     Effort: Pulmonary effort is normal. No respiratory distress.     Breath sounds: Normal breath sounds. No wheezing.  Musculoskeletal:     Cervical back: Neck supple.  Skin:    General: Skin is warm and dry.  Neurological:     Mental Status: She is alert and oriented to person, place, and time.  Psychiatric:        Mood and Affect: Mood normal.        Behavior: Behavior normal.        Thought Content: Thought content normal.        Judgment: Judgment normal.      BP 111/66 (BP Location: Right Arm, Patient Position: Sitting, Cuff Size: Large)   Pulse 80   Resp 16   Ht 5' 0.5" (1.537 m)   Wt 210 lb (95.3 kg)   LMP 08/09/2016   SpO2 98%   BMI 40.34 kg/m  Wt Readings from Last 3 Encounters:  01/25/23 210 lb (95.3 kg)  01/08/23 206 lb (93.4 kg)  12/25/22 207 lb (93.9 kg)       Assessment & Plan:   Problem List Items Addressed This Visit       Unprioritized   Rheumatoid arthritis (HCC)   Stable on orencia. Followed by Dr. Corliss Skains.       OSA (obstructive sleep apnea)   New diagnosis- she is working neuro re: cpap.  Hopefully this will help with her headaches.       Migraine   Follows closely with Neuro. On Emgality.        Iron deficiency anemia   Lab Results  Component Value Date   WBC 3.7 (L) 12/20/2022   HGB 12.9 12/20/2022   HCT 40.2 12/20/2022   MCV 86.6 12/20/2022   PLT 221 12/20/2022   Iron and blood count stable on recent check. Continue iron supplement.       Essential hypertension - Primary   BP Readings from Last 3 Encounters:  01/25/23 111/66  01/08/23 122/79  12/25/22 123/75   At goal on diltiazem carvedilol and valsartan, continue same       Diabetic peripheral neuropathy (HCC)   Stable/unchanged.      Controlled type 2 diabetes mellitus without complication, without long-term current use of insulin (HCC)   Just started mounjaro which is [facility].  She is  tolerating so far.  Lab Results  Component Value Date   HGBA1C 6.7 (H) 10/17/2022         Relevant Orders   HgB A1c   Chronic diastolic CHF (congestive heart failure) (HCC)   Patient is clinically euvolemic. Monitor.       B12 deficiency   Continues b12 injections.  I have discontinued Lanissa D. Bardon's Vitamin B-12. I am also having her maintain her Travatan Z, calcium-vitamin D, aspirin EC, Iron (Ferrous Sulfate), fluticasone, albuterol, SUMAtriptan, diltiazem, ibuprofen, timolol, triamterene-hydrochlorothiazide, Orencia ClickJect, Emgality, valsartan, carvedilol, rosuvastatin, Vitamin D (Ergocalciferol), and tirzepatide. We administered cyanocobalamin.  Meds ordered this encounter  Medications   cyanocobalamin (VITAMIN B12) injection 1,000 mcg

## 2023-01-25 NOTE — Assessment & Plan Note (Addendum)
Lab Results  Component Value Date   WBC 3.7 (L) 12/20/2022   HGB 12.9 12/20/2022   HCT 40.2 12/20/2022   MCV 86.6 12/20/2022   PLT 221 12/20/2022   Iron and blood count stable on recent check. Continue iron supplement.

## 2023-01-25 NOTE — Assessment & Plan Note (Signed)
Patient is clinically euvolemic. Monitor.

## 2023-01-27 ENCOUNTER — Encounter: Payer: Self-pay | Admitting: Family

## 2023-01-28 ENCOUNTER — Telehealth: Payer: Self-pay

## 2023-01-28 ENCOUNTER — Other Ambulatory Visit: Payer: Self-pay

## 2023-01-28 ENCOUNTER — Encounter: Payer: Self-pay | Admitting: Oncology

## 2023-01-28 ENCOUNTER — Other Ambulatory Visit (HOSPITAL_COMMUNITY): Payer: Self-pay

## 2023-01-28 DIAGNOSIS — M0579 Rheumatoid arthritis with rheumatoid factor of multiple sites without organ or systems involvement: Secondary | ICD-10-CM

## 2023-01-28 DIAGNOSIS — Z79899 Other long term (current) drug therapy: Secondary | ICD-10-CM

## 2023-01-28 NOTE — Telephone Encounter (Signed)
Per Shavod at [facility], pt is now locked in to fill through [facility]. Routing to [facility] to have new Rx sent in.

## 2023-01-28 NOTE — Telephone Encounter (Signed)
Received notification from [employer] regarding a prior authorization for ORENCIA SQ. Authorization has been APPROVED from 12/29/2022 to 01/28/2024.  Approval letter has not yet been received, but will be sent to scan center once obtained.  Authorization # [id number]

## 2023-01-28 NOTE — Telephone Encounter (Signed)
Pt appears to have a new primary coverage through [employer]. Initial test claim provides a rejection stating that [place] is a Plan Exclusion. Will attempt to submit PA, however will likely need to appeal.  Submitted an URGENT Prior Authorization request to [employer] for ORENCIA SQ via CoverMyMeds. Will update once we receive a response.  Key: [id number]

## 2023-01-28 NOTE — Progress Notes (Signed)
Disenrolling -  specialty pharmacy not preferred by insurance. [facility] aware and will have clinic pharmacist send script to [facility].

## 2023-01-29 DIAGNOSIS — G4733 Obstructive sleep apnea (adult) (pediatric): Secondary | ICD-10-CM | POA: Diagnosis not present

## 2023-02-01 MED ORDER — ORENCIA CLICKJECT 125 MG/ML ~~LOC~~ SOAJ: 125.0000 mg | SUBCUTANEOUS | 1 refills | Status: DC

## 2023-02-01 NOTE — Telephone Encounter (Signed)
Rx for Orencia sent to [facility]. MyChart message sent to pt to advise  [facility]: [phone]  Chesley Mires, PharmD, MPH, BCPS, CPP Clinical Pharmacist (Rheumatology and Pulmonology)

## 2023-02-01 NOTE — Addendum Note (Signed)
Addended by: Murrell Redden on: 02/01/2023 08:59 AM   Modules accepted: Orders

## 2023-02-12 ENCOUNTER — Ambulatory Visit: Payer: Managed Care, Other (non HMO) | Admitting: Bariatrics

## 2023-02-12 DIAGNOSIS — H25813 Combined forms of age-related cataract, bilateral: Secondary | ICD-10-CM | POA: Diagnosis not present

## 2023-02-12 DIAGNOSIS — H401131 Primary open-angle glaucoma, bilateral, mild stage: Secondary | ICD-10-CM | POA: Diagnosis not present

## 2023-02-25 DIAGNOSIS — H25811 Combined forms of age-related cataract, right eye: Secondary | ICD-10-CM | POA: Diagnosis not present

## 2023-02-25 DIAGNOSIS — H401111 Primary open-angle glaucoma, right eye, mild stage: Secondary | ICD-10-CM | POA: Diagnosis not present

## 2023-03-01 DIAGNOSIS — G4733 Obstructive sleep apnea (adult) (pediatric): Secondary | ICD-10-CM | POA: Diagnosis not present

## 2023-03-06 NOTE — Progress Notes (Deleted)
 Office Visit Note  Patient: Amber [name]             Date of Birth: 1960-07-06           MRN: [id number]             PCP: Sandford Craze, NP Referring: Sandford Craze, NP Visit Date: 03/20/2023 Occupation: @GUAROCC @  Subjective:    History of Present Illness: Amber [name] is a 63 y.o. female with history of seropositive rheumatoid arthritis and sjogren's syndrome.  Patient remains on Orencia 125 mg subcutaneous every 10 days. Initially started on Orencia 01/28/20.    CBC updated on 12/20/22.  CMP updated on 12/05/22.  TB gold negative on 02/16/22.  Discussed the importance of holding orencia if she develops signs or symptoms of an infection and to resume once the infection has completely cleared.    Activities of Daily Living:  Patient reports morning stiffness for *** {minute/hour:19697}.   Patient {ACTIONS;DENIES/REPORTS:21021675::"Denies"} nocturnal pain.  Difficulty dressing/grooming: {ACTIONS;DENIES/REPORTS:21021675::"Denies"} Difficulty climbing stairs: {ACTIONS;DENIES/REPORTS:21021675::"Denies"} Difficulty getting out of chair: {ACTIONS;DENIES/REPORTS:21021675::"Denies"} Difficulty using hands for taps, buttons, cutlery, and/or writing: {ACTIONS;DENIES/REPORTS:21021675::"Denies"}  No Rheumatology ROS completed.   PMFS History:  Patient Active Problem List   Diagnosis Date Noted   OSA (obstructive sleep apnea) 01/25/2023   Vitamin D insufficiency 12/10/2022   Preventative health care 07/17/2022   Peripheral neuropathy 07/17/2022   Need for immunization against poliomyelitis 05/01/2022   High risk HPV infection 10/17/2020   Cerebrovascular disease 10/13/2020   Colon polyps 09/26/2020   GERD (gastroesophageal reflux disease) 09/26/2020   B12 deficiency 05/03/2020   Hyperlipidemia 05/03/2020   Asthma 05/03/2020   Diabetic peripheral neuropathy (HCC) 01/05/2020   Class 2 severe obesity with serious comorbidity and body mass index (BMI) of 38.0  to 38.9 in adult, unspecified obesity type (HCC) 01/05/2020   Adrenal mass, right (HCC) 01/05/2020   Hypertensive heart disease 09/04/2017   Controlled type 2 diabetes mellitus without complication, without long-term current use of insulin (HCC) 05/31/2017   Chronic diastolic CHF (congestive heart failure) (HCC) 04/24/2017   CAD (coronary artery disease) 04/24/2017   History of BCG vaccination 06/14/2016   Fibroids 07/27/2014   Migraine 04/19/2014   Elevated serum protein level 04/09/2014   Encounter for counseling regarding immunization 04/09/2014   Iron deficiency anemia 09/02/2013   Obesity (BMI 30-39.9) 10/17/2012   Glaucoma 11/07/2011   Rheumatoid arthritis (HCC) 11/07/2011   Essential hypertension 11/05/2011   Sjogren's disease (HCC) 11/05/2011   Anemia 09/22/2010   Fibroid uterus 09/22/2010    Past Medical History:  Diagnosis Date   Anemia    Arthritis    Asthma 05/03/2020   Back pain    Blood transfusion without reported diagnosis    CAD (coronary artery disease) 04/24/2017   Cataract    Cerebrovascular disease 10/13/2020   Chronic diastolic CHF (congestive heart failure) (HCC) 04/24/2017   Colon polyps    Controlled type 2 diabetes mellitus without complication, without long-term current use of insulin (HCC) 05/31/2017   Diabetic peripheral neuropathy (HCC) 01/05/2020   Ectopic pregnancy    RIGHT salpingostomy   Elective abortion    Elective abortion 09/26/2020   RIGHT salpingostomy   Essential hypertension 09/04/2017   GERD (gastroesophageal reflux disease)    Glaucoma    both eyes   Headache    migraines   History of chicken pox    History of chicken pox 09/26/2020   migraines   Hyperlipemia    Hypertension    Iron deficiency  anemia 09/02/2013   Joint pain    Migraine 04/19/2014   Obesity (BMI 30-39.9) 10/17/2012   Palpitations 10/01/2012   Pre-diabetes    Rheumatoid arthritis (HCC) 11/07/2011   Rheumatoid arthritis(714.0) 11/07/2011   SAB  (spontaneous abortion)    SAB (spontaneous abortion) 11/07/2011   Shortness of breath    Sjogren's disease (HCC)    Swelling of lower extremity    Vitamin B12 deficiency    Wheezing     Family History  Problem Relation Age of Onset   Hypertension Mother        died from heart disease   Heart disease Mother        deceased, unknown cause   Stroke Mother    Obesity Mother    High Cholesterol Mother    Sudden death Father    Diabetes Sister    Hypertension Sister    Esophageal cancer Sister    Stomach cancer Sister    Cancer Sister    Diabetes Sister    Parkinson's disease Brother    Rectal cancer Neg Hx    Colon cancer Neg Hx    Colon polyps Neg Hx    Past Surgical History:  Procedure Laterality Date   ABDOMINAL SURGERY     bowel repair   COLONOSCOPY     COLONOSCOPY  08/2022   DILATION AND CURETTAGE OF UTERUS     X 2   MENISCUS REPAIR Right    MENISCUS TEAR REPAIR   MYOMECTOMY N/A 07/27/2014   Procedure: ABDOMINAL MYOMECTOMY;  Surgeon: Hoover Browns, MD;  Location: [facility];  Service: Gynecology;  Laterality: N/A;   PELVIC LAPAROSCOPY  1994   IN 1999 LAPAROSCOPY WITH INCIDENTAL ENTEROTOMY REQUIRING LAPAROTOMY   ROTATOR CUFF REPAIR Left 06/21/2021   UTERINE FIBROID SURGERY     July 2016   Social History   Social History Narrative   Regular exercise:  No   Caffeine Use: 1 cup coffee daily   No children   "Happily divorced"   Reports that she is involved at her church   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  Had a sister up in [place] who passed away 2015/04/01 who she was close to.   Born in [place]- she came to [place] as adult.  Age 78.    Left-handed.         Immunization History  Administered Date(s) Administered   Hepatitis A, Adult 11/01/2017, 12/03/2018   IPV 11/01/2017, 05/01/2022   Influenza Split 11/07/2011   Influenza, Seasonal, Injecte, Preservative Fre 09/24/2022   Influenza,inj,Quad PF,6+ Mos 10/17/2012, 01/15/2014, 08/31/2014,  09/30/2017, 09/10/2018, 09/21/2019, 10/07/2020, 11/07/2021   Meningococcal Mcv4o 11/01/2017   Moderna Sars-Covid-2 Vaccination 01/28/2019, 02/27/2019, 11/27/2019   Pfizer(Comirnaty)Fall Seasonal Vaccine 12 years and older 10/03/2020, 10/02/2022   Pneumococcal Conjugate-13 08/05/2020   Pneumococcal Polysaccharide-23 09/10/2018   Td 07/17/2022   Tdap 06/01/2011, 11/25/2012   Typhoid Live 11/01/2017   Zoster Recombinant(Shingrix) 10/04/2017, 12/04/2017     Objective: Vital Signs: LMP 08/09/2016    Physical Exam Vitals and nursing note reviewed.  Constitutional:      Appearance: She is well-developed.  HENT:     Head: Normocephalic and atraumatic.  Eyes:     Conjunctiva/sclera: Conjunctivae normal.  Cardiovascular:     Rate and Rhythm: Normal rate and regular rhythm.     Heart sounds: Normal heart sounds.  Pulmonary:     Effort: Pulmonary effort is normal.     Breath sounds: Normal breath sounds.  Abdominal:  General: Bowel sounds are normal.     Palpations: Abdomen is soft.  Musculoskeletal:     Cervical back: Normal range of motion.  Lymphadenopathy:     Cervical: No cervical adenopathy.  Skin:    General: Skin is warm and dry.     Capillary Refill: Capillary refill takes less than 2 seconds.  Neurological:     Mental Status: She is alert and oriented to person, place, and time.  Psychiatric:        Behavior: Behavior normal.      Musculoskeletal Exam: ***  CDAI Exam: CDAI Score: -- Patient Global: --; Provider Global: -- Swollen: --; Tender: -- Joint Exam 03/20/2023   No joint exam has been documented for this visit   There is currently no information documented on the homunculus. Go to the Rheumatology activity and complete the homunculus joint exam.  Investigation: No additional findings.  Imaging: No results found.  Recent Labs: Lab Results  Component Value Date   WBC 3.7 (L) 12/20/2022   HGB 12.9 12/20/2022   PLT 221 12/20/2022   NA 137  12/05/2022   K 3.8 12/05/2022   CL 100 12/05/2022   CO2 24 12/05/2022   GLUCOSE 81 12/05/2022   BUN 12 12/05/2022   CREATININE 0.81 12/05/2022   BILITOT 0.8 12/05/2022   ALKPHOS 60 12/05/2022   AST 21 12/05/2022   ALT 17 12/05/2022   PROT 8.8 (H) 12/05/2022   ALBUMIN 4.3 12/05/2022   CALCIUM 9.8 12/05/2022   GFRAA 115 02/19/2020   QFTBGOLD Negative 06/14/2016   QFTBGOLDPLUS NEGATIVE 02/16/2022    Speciality Comments: PLQ eye exam: 12/09/2018 normal. [facility].  Procedures:  No procedures performed Allergies: Gabapentin   Assessment / Plan:     Visit Diagnoses: Rheumatoid arthritis involving multiple sites with positive rheumatoid factor (HCC)  High risk medication use  Sjogren's syndrome with other organ involvement (HCC)  S/P rotator cuff repair left  Primary osteoarthritis of both knees  Primary osteoarthritis of both feet  Plantar fasciitis of right foot  Neck pain  Family history of rheumatoid arthritis  Coronary artery disease involving native coronary artery of native heart without angina pectoris  Essential hypertension  Palpitations  Chronic diastolic CHF (congestive heart failure) (HCC)  Controlled type 2 diabetes mellitus without complication, without long-term current use of insulin (HCC)  Hx of migraines  Orders: No orders of the defined types were placed in this encounter.  No orders of the defined types were placed in this encounter.   Face-to-face time spent with patient was *** minutes. Greater than 50% of time was spent in counseling and coordination of care.  Follow-Up Instructions: No follow-ups on file.   Gearldine Bienenstock, PA-C  Note - This record has been created using Dragon software.  Chart creation errors have been sought, but may not always  have been located. Such creation errors do not reflect on  the standard of medical care.

## 2023-03-07 ENCOUNTER — Ambulatory Visit: Admitting: Podiatry

## 2023-03-13 ENCOUNTER — Ambulatory Visit (INDEPENDENT_AMBULATORY_CARE_PROVIDER_SITE_OTHER): Admitting: Podiatry

## 2023-03-13 ENCOUNTER — Encounter: Payer: Self-pay | Admitting: Podiatry

## 2023-03-13 ENCOUNTER — Ambulatory Visit: Admitting: Podiatry

## 2023-03-13 DIAGNOSIS — M722 Plantar fascial fibromatosis: Secondary | ICD-10-CM

## 2023-03-13 MED ORDER — TRIAMCINOLONE ACETONIDE 10 MG/ML IJ SUSP
10.0000 mg | Freq: Once | INTRAMUSCULAR | Status: AC
  Administered 2023-03-13: 10 mg via INTRA_ARTICULAR

## 2023-03-13 NOTE — Progress Notes (Signed)
 Subjective:   Patient ID: Amber [name], female   DOB: 63 y.o.   MRN: [id number]   HPI Patient states the pain is been really bad in my heels and I do seem to improve somewhat in the ankle   ROS      Objective:  Physical Exam  Neuro ocular status intact exquisite discomfort medial fascial band heel region bilateral     Assessment:  Inflammatory fasciitis of the heel bilateral at insertion     Plan:  H&P reviewed sterile prep injected the plantar fascia bilateral 3 mg Kenalog 5 mg Xylocaine at insertion applied sterile dressing reappoint to recheck

## 2023-03-16 ENCOUNTER — Other Ambulatory Visit: Payer: Self-pay | Admitting: Family

## 2023-03-20 ENCOUNTER — Ambulatory Visit: Payer: Managed Care, Other (non HMO) | Admitting: Physician Assistant

## 2023-03-20 DIAGNOSIS — I5032 Chronic diastolic (congestive) heart failure: Secondary | ICD-10-CM

## 2023-03-20 DIAGNOSIS — M170 Bilateral primary osteoarthritis of knee: Secondary | ICD-10-CM

## 2023-03-20 DIAGNOSIS — Z8669 Personal history of other diseases of the nervous system and sense organs: Secondary | ICD-10-CM

## 2023-03-20 DIAGNOSIS — Z79899 Other long term (current) drug therapy: Secondary | ICD-10-CM

## 2023-03-20 DIAGNOSIS — Z8261 Family history of arthritis: Secondary | ICD-10-CM

## 2023-03-20 DIAGNOSIS — Z9889 Other specified postprocedural states: Secondary | ICD-10-CM

## 2023-03-20 DIAGNOSIS — I10 Essential (primary) hypertension: Secondary | ICD-10-CM

## 2023-03-20 DIAGNOSIS — M0579 Rheumatoid arthritis with rheumatoid factor of multiple sites without organ or systems involvement: Secondary | ICD-10-CM

## 2023-03-20 DIAGNOSIS — M542 Cervicalgia: Secondary | ICD-10-CM

## 2023-03-20 DIAGNOSIS — I2510 Atherosclerotic heart disease of native coronary artery without angina pectoris: Secondary | ICD-10-CM

## 2023-03-20 DIAGNOSIS — M3509 Sicca syndrome with other organ involvement: Secondary | ICD-10-CM

## 2023-03-20 DIAGNOSIS — R002 Palpitations: Secondary | ICD-10-CM

## 2023-03-20 DIAGNOSIS — M722 Plantar fascial fibromatosis: Secondary | ICD-10-CM

## 2023-03-20 DIAGNOSIS — E119 Type 2 diabetes mellitus without complications: Secondary | ICD-10-CM

## 2023-03-20 DIAGNOSIS — M19071 Primary osteoarthritis, right ankle and foot: Secondary | ICD-10-CM

## 2023-03-21 NOTE — Progress Notes (Signed)
 Office Visit Note  Patient: Amber [name]             Date of Birth: 1960-11-29           MRN: [id number]             PCP: Sandford Craze, NP Referring: Sandford Craze, NP Visit Date: 03/25/2023 Occupation: @GUAROCC @  Subjective:  Medication monitoring   History of Present Illness: Amber [name] is a 63 y.o. female with history of rheumatoid arthritis.  Patient reports that she underwent meniscal repair for the right knee on 01/16/2023.  Patient states that she also had right cataract surgery on 02/25/2023 and the left eye was performed on 03/11/2023. Patient states her last dose of orencia was administered at the end of January but she has been unable to get a refill of Orencia since her specialty pharmacy changed to [facility].  Patient states she continues to have chronic soreness and stiffness in the right knee.  She has been going to physical therapy twice a week as advised.  She has been taking Aleve 2 tablets daily and ibuprofen 800 mg at bedtime for pain relief.  She experiences intermittent total body pain but denies any increased inflammation since being off of Orencia. She denies any recent or recurrent infections.   Activities of Daily Living:  Patient reports morning stiffness for 5 minutes.   Patient Reports nocturnal pain.  Difficulty dressing/grooming: Denies Difficulty climbing stairs: Reports Difficulty getting out of chair: Reports Difficulty using hands for taps, buttons, cutlery, and/or writing: Reports  Review of Systems  Constitutional:  Positive for fatigue.  HENT:  Positive for mouth dryness and nose dryness. Negative for mouth sores.   Eyes:  Positive for pain and dryness.  Respiratory:  Negative for shortness of breath and difficulty breathing.   Cardiovascular:  Negative for chest pain and palpitations.  Gastrointestinal:  Negative for blood in stool, constipation and diarrhea.  Endocrine: Negative for increased urination.  Genitourinary:   Negative for involuntary urination.  Musculoskeletal:  Positive for joint pain, gait problem, joint pain, joint swelling, myalgias, muscle weakness, morning stiffness, muscle tenderness and myalgias.  Skin:  Positive for hair loss. Negative for color change, rash and sensitivity to sunlight.  Allergic/Immunologic: Negative for susceptible to infections.  Neurological:  Positive for headaches. Negative for dizziness.  Hematological:  Negative for swollen glands.  Psychiatric/Behavioral:  Positive for sleep disturbance. Negative for depressed mood. The patient is not nervous/anxious.     PMFS History:  Patient Active Problem List   Diagnosis Date Noted  . OSA (obstructive sleep apnea) 01/25/2023  . Vitamin D insufficiency 12/10/2022  . Preventative health care 07/17/2022  . Peripheral neuropathy 07/17/2022  . Need for immunization against poliomyelitis 05/01/2022  . High risk HPV infection 10/17/2020  . Cerebrovascular disease 10/13/2020  . Colon polyps 09/26/2020  . GERD (gastroesophageal reflux disease) 09/26/2020  . B12 deficiency 05/03/2020  . Hyperlipidemia 05/03/2020  . Asthma 05/03/2020  . Diabetic peripheral neuropathy (HCC) 01/05/2020  . Class 2 severe obesity with serious comorbidity and body mass index (BMI) of 38.0 to 38.9 in adult, unspecified obesity type (HCC) 01/05/2020  . Adrenal mass, right (HCC) 01/05/2020  . Hypertensive heart disease 09/04/2017  . Controlled type 2 diabetes mellitus without complication, without long-term current use of insulin (HCC) 05/31/2017  . Chronic diastolic CHF (congestive heart failure) (HCC) 04/24/2017  . CAD (coronary artery disease) 04/24/2017  . History of BCG vaccination 06/14/2016  . Fibroids 07/27/2014  . Migraine  04/19/2014  . Elevated serum protein level 04/09/2014  . Encounter for counseling regarding immunization 04/09/2014  . Iron deficiency anemia 09/02/2013  . Obesity (BMI 30-39.9) 10/17/2012  . Glaucoma 11/07/2011  .  Rheumatoid arthritis (HCC) 11/07/2011  . Essential hypertension 11/05/2011  . Sjogren's disease (HCC) 11/05/2011  . Anemia 09/22/2010  . Fibroid uterus 09/22/2010    Past Medical History:  Diagnosis Date  . Anemia   . Arthritis   . Asthma 05/03/2020  . Back pain   . Blood transfusion without reported diagnosis   . CAD (coronary artery disease) 04/24/2017  . Cataract   . Cerebrovascular disease 10/13/2020  . Chronic diastolic CHF (congestive heart failure) (HCC) 04/24/2017  . Colon polyps   . Controlled type 2 diabetes mellitus without complication, without long-term current use of insulin (HCC) 05/31/2017  . Diabetic peripheral neuropathy (HCC) 01/05/2020  . Ectopic pregnancy    RIGHT salpingostomy  . Elective abortion   . Elective abortion 09/26/2020   RIGHT salpingostomy  . Essential hypertension 09/04/2017  . GERD (gastroesophageal reflux disease)   . Glaucoma    both eyes  . Headache    migraines  . History of chicken pox   . History of chicken pox 09/26/2020   migraines  . Hyperlipemia   . Hypertension   . Iron deficiency anemia 09/02/2013  . Joint pain   . Migraine 04/19/2014  . Obesity (BMI 30-39.9) 10/17/2012  . Palpitations 10/01/2012  . Pre-diabetes   . Rheumatoid arthritis (HCC) 11/07/2011  . Rheumatoid arthritis(714.0) 11/07/2011  . SAB (spontaneous abortion)   . SAB (spontaneous abortion) 11/07/2011  . Shortness of breath   . Sjogren's disease (HCC)   . Swelling of lower extremity   . Vitamin B12 deficiency   . Wheezing     Family History  Problem Relation Age of Onset  . Hypertension Mother        died from heart disease  . Heart disease Mother        deceased, unknown cause  . Stroke Mother   . Obesity Mother   . High Cholesterol Mother   . Sudden death Father   . Diabetes Sister   . Hypertension Sister   . Esophageal cancer Sister   . Stomach cancer Sister   . Cancer Sister   . Diabetes Sister   . Parkinson's disease Brother   .  Rectal cancer Neg Hx   . Colon cancer Neg Hx   . Colon polyps Neg Hx    Past Surgical History:  Procedure Laterality Date  . ABDOMINAL SURGERY     bowel repair  . CATARACT EXTRACTION     02/25/2023, 03/11/2023  . COLONOSCOPY    . COLONOSCOPY  08/2022  . DILATION AND CURETTAGE OF UTERUS     X 2  . KNEE ARTHROSCOPY W/ MENISCAL REPAIR Right 01/16/2023  . MENISCUS REPAIR Right    MENISCUS TEAR REPAIR  . MYOMECTOMY N/A 07/27/2014   Procedure: ABDOMINAL MYOMECTOMY;  Surgeon: Hoover Browns, MD;  Location: [facility];  Service: Gynecology;  Laterality: N/A;  . PELVIC LAPAROSCOPY  1994   IN 1999 LAPAROSCOPY WITH INCIDENTAL ENTEROTOMY REQUIRING LAPAROTOMY  . ROTATOR CUFF REPAIR Left 06/21/2021  . 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  Had a sister up in [place] who passed away 04-21-15 who she was close to.   Born in [place]- she came to [place] as adult.  Age 25.    Left-handed.         Immunization History  Administered Date(s) Administered  . Hepatitis A, Adult 11/01/2017, 12/03/2018  . IPV 11/01/2017, 05/01/2022  . Influenza Split 11/07/2011  . Influenza, Seasonal, Injecte, Preservative Fre 09/24/2022  . Influenza,inj,Quad PF,6+ Mos 10/17/2012, 01/15/2014, 08/31/2014, 09/30/2017, 09/10/2018, 09/21/2019, 10/07/2020, 11/07/2021  . Meningococcal Mcv4o 11/01/2017  . Moderna Sars-Covid-2 Vaccination 01/28/2019, 02/27/2019, 11/27/2019  . Pfizer(Comirnaty)Fall Seasonal Vaccine 12 years and older 10/03/2020, 10/02/2022  . Pneumococcal Conjugate-13 08/05/2020  . Pneumococcal Polysaccharide-23 09/10/2018  . Td 07/17/2022  . Tdap 06/01/2011, 11/25/2012  . Typhoid Live 11/01/2017  . Zoster Recombinant(Shingrix) 10/04/2017, 12/04/2017     Objective: Vital  Signs: BP 129/74 (BP Location: Left Arm, Patient Position: Sitting, Cuff Size: Large)   Pulse 70   Resp 16   Ht 5\' 1"  (1.549 m)   Wt 210 lb 6.4 oz (95.4 kg)   LMP 08/09/2016   BMI 39.75 kg/m    Physical Exam Vitals and nursing note reviewed.  Constitutional:      Appearance: She is well-developed.  HENT:     Head: Normocephalic and atraumatic.  Eyes:     Conjunctiva/sclera: Conjunctivae normal.  Cardiovascular:     Rate and Rhythm: Normal rate and regular rhythm.     Heart sounds: Normal heart sounds.  Pulmonary:     Effort: Pulmonary effort is normal.     Breath sounds: Normal breath sounds.  Abdominal:     General: Bowel sounds are normal.     Palpations: Abdomen is soft.  Musculoskeletal:     Cervical back: Normal range of motion.  Lymphadenopathy:     Cervical: No cervical adenopathy.  Skin:    General: Skin is warm and dry.     Capillary Refill: Capillary refill takes less than 2 seconds.  Neurological:     Mental Status: She is alert and oriented to person, place, and time.  Psychiatric:        Behavior: Behavior normal.     Musculoskeletal Exam: C-spine has limited range of motion with lateral rotation.  Shoulder joints have good range of motion with some discomfort in the left shoulder.  Elbow joints, wrist joints, MCPs, PIPs, DIPs have good range of motion with no synovitis.  Complete fist formation bilaterally.  Hip joints have good range of motion with no groin pain.  Limited flexion of the right knee.  Warmth of the right knee noted.  Left knee had no warmth or effusion.  Ankle joints have good range of motion with some tenderness to palpation bilaterally.  CDAI Exam: CDAI Score: -- Patient Global: --; Provider Global: -- Swollen: --; Tender: -- Joint Exam 03/25/2023   No joint exam has been documented for this visit   There is currently no information documented on the homunculus. Go to the Rheumatology activity and complete the homunculus joint  exam.  Investigation: No additional findings.  Imaging: No results found.  Recent Labs: Lab Results  Component Value Date   WBC 3.7 (L) 12/20/2022   HGB 12.9 12/20/2022   PLT 221 12/20/2022   NA 137 12/05/2022   K 3.8 12/05/2022   CL 100 12/05/2022   CO2 24 12/05/2022   GLUCOSE 81 12/05/2022   BUN 12 12/05/2022  CREATININE 0.81 12/05/2022   BILITOT 0.8 12/05/2022   ALKPHOS 60 12/05/2022   AST 21 12/05/2022   ALT 17 12/05/2022   PROT 8.8 (H) 12/05/2022   ALBUMIN 4.3 12/05/2022   CALCIUM 9.8 12/05/2022   GFRAA 115 02/19/2020   QFTBGOLD Negative 06/14/2016   QFTBGOLDPLUS NEGATIVE 02/16/2022    Speciality Comments: PLQ eye exam: 12/09/2018 normal. [facility].  Procedures:  No procedures performed Allergies: Gabapentin    Assessment / Plan:     Visit Diagnoses: Rheumatoid arthritis involving multiple sites with positive rheumatoid factor (HCC) - Positive RF, negative anti-CCP, -14 3 3  ETA, diagnosed 2010 by Dr. Oleta Mouse, later Dr. Dierdre Forth: She has no synovitis on examination today.  Her last dose of Orencia was administered at the end of January 2025.  Patient underwent right knee arthroscopic surgery for meniscal repair on 01/16/2023 followed by right eye cataract surgery on 02/25/2023 and left eye cataract surgery on 03/11/2023.  She continues to go to physical therapy twice a week for her right knee.  She has been taking Aleve during the day and ibuprofen at bedtime for pain relief.  Patient has a prescription for Orencia at [facility] but has been unable to get the shipment set up.  Patient was advised to call [facility] to set up a refill to reinitiate therapy.  Patient will be reinitiating Orencia 125 mg subcutaneous injections every 10 days.  She was advised to notify us if she develops signs or symptoms of a flare.  High risk medication use - Orencia 125 mg subcutaneous every 10 days. Initially started on Orencia 01/28/20. (methotrexate was discontinued due to neutropenia).   CBC updated on 12/20/22.  CMP updated on 12/05/22. Orders for CBC and CMP released today.  Lipid panel updated on 12/05/22.  TB gold negative on 02/16/22. Order for TB gold released today.  Discussed the importance of holding orencia if she develops signs or symptoms of an infection and to resume once the infection has completely cleared.  - Plan: COMPLETE METABOLIC PANEL WITH GFR, CBC with Differential/Platelet, QuantiFERON-TB Gold Plus  Screening for tuberculosis -Order for TB gold released today.  Plan: QuantiFERON-TB Gold Plus  Lipid screening: Lipid panel within normal limits on 12/05/2022.  Sjogren's syndrome with other organ involvement [facility]): Patient continues to have chronic sicca symptoms.  Discussed the use of over-the-counter products.  S/P rotator cuff repair left - June 21, 2021  Primary osteoarthritis of both knees: Chronic right knee joint pain--warmth and limited flexion-currently going to physical therapy twice a week status post meniscal repair on 01/16/2023.  S/P arthroscopic knee surgery: Right knee 01/26/2023: Undergoing physical therapy twice a week.  Primary osteoarthritis of both feet: Under care of Dr. Charlsie Merles.  Plantar fasciitis, bilateral: Under care of Dr. Charlsie Merles.  Wearing proper fitting shoes.  Other medical conditions are listed as follows:  Neck pain -Followed by Dr. Lucie Leather.  Family history of rheumatoid arthritis  Coronary artery disease involving native coronary artery of native heart without angina pectoris  Essential hypertension: Blood pressure was 129/74 today in the office.  Palpitations  Chronic diastolic CHF (congestive heart failure) (HCC)  Controlled type 2 diabetes mellitus without complication, without long-term current use of insulin (HCC)  Hx of migraines     Orders: Orders Placed This Encounter  Procedures  . COMPLETE METABOLIC PANEL WITH GFR  . CBC with Differential/Platelet  . QuantiFERON-TB Gold Plus   No orders of  the defined types were placed in this encounter.    Follow-Up Instructions:  Return in about 3 months (around 06/25/2023) for Rheumatoid arthritis.   Gearldine Bienenstock, PA-C  Note - This record has been created using Dragon software.  Chart creation errors have been sought, but may not always  have been located. Such creation errors do not reflect on  the standard of medical care.

## 2023-03-25 ENCOUNTER — Ambulatory Visit: Attending: Physician Assistant | Admitting: Physician Assistant

## 2023-03-25 ENCOUNTER — Encounter: Payer: Self-pay | Admitting: Physician Assistant

## 2023-03-25 VITALS — BP 129/74 | HR 70 | Resp 16 | Ht 61.0 in | Wt 210.4 lb

## 2023-03-25 DIAGNOSIS — M19071 Primary osteoarthritis, right ankle and foot: Secondary | ICD-10-CM

## 2023-03-25 DIAGNOSIS — E119 Type 2 diabetes mellitus without complications: Secondary | ICD-10-CM

## 2023-03-25 DIAGNOSIS — Z79899 Other long term (current) drug therapy: Secondary | ICD-10-CM | POA: Diagnosis not present

## 2023-03-25 DIAGNOSIS — Z9889 Other specified postprocedural states: Secondary | ICD-10-CM | POA: Diagnosis not present

## 2023-03-25 DIAGNOSIS — I2510 Atherosclerotic heart disease of native coronary artery without angina pectoris: Secondary | ICD-10-CM

## 2023-03-25 DIAGNOSIS — M3509 Sicca syndrome with other organ involvement: Secondary | ICD-10-CM

## 2023-03-25 DIAGNOSIS — M0579 Rheumatoid arthritis with rheumatoid factor of multiple sites without organ or systems involvement: Secondary | ICD-10-CM | POA: Diagnosis not present

## 2023-03-25 DIAGNOSIS — M542 Cervicalgia: Secondary | ICD-10-CM

## 2023-03-25 DIAGNOSIS — M170 Bilateral primary osteoarthritis of knee: Secondary | ICD-10-CM

## 2023-03-25 DIAGNOSIS — I10 Essential (primary) hypertension: Secondary | ICD-10-CM

## 2023-03-25 DIAGNOSIS — R002 Palpitations: Secondary | ICD-10-CM

## 2023-03-25 DIAGNOSIS — Z8261 Family history of arthritis: Secondary | ICD-10-CM

## 2023-03-25 DIAGNOSIS — Z111 Encounter for screening for respiratory tuberculosis: Secondary | ICD-10-CM

## 2023-03-25 DIAGNOSIS — I5032 Chronic diastolic (congestive) heart failure: Secondary | ICD-10-CM

## 2023-03-25 DIAGNOSIS — M722 Plantar fascial fibromatosis: Secondary | ICD-10-CM

## 2023-03-25 DIAGNOSIS — M19072 Primary osteoarthritis, left ankle and foot: Secondary | ICD-10-CM

## 2023-03-25 DIAGNOSIS — Z8669 Personal history of other diseases of the nervous system and sense organs: Secondary | ICD-10-CM

## 2023-03-25 DIAGNOSIS — Z13220 Encounter for screening for lipoid disorders: Secondary | ICD-10-CM

## 2023-03-25 NOTE — Progress Notes (Signed)
 CBC WNL

## 2023-03-25 NOTE — Patient Instructions (Addendum)
 Please call [employer] to enroll for copay assistance then call [facility] to set up shipment  [employer] phone: [phone]  [facility] phone: [phone]

## 2023-03-26 NOTE — Progress Notes (Signed)
 Glucose is 113.  Globulin is elevated. Rest of CMP WNL.  We will continue to monitor.

## 2023-03-27 LAB — QUANTIFERON-TB GOLD PLUS
Mitogen-NIL: 7.77 [IU]/mL
NIL: 0.02 [IU]/mL
QuantiFERON-TB Gold Plus: NEGATIVE
TB1-NIL: 0 [IU]/mL
TB2-NIL: 0 [IU]/mL

## 2023-03-27 LAB — COMPLETE METABOLIC PANEL WITH GFR
AG Ratio: 0.9 (calc) — ABNORMAL LOW (ref 1.0–2.5)
ALT: 13 U/L (ref 6–29)
AST: 15 U/L (ref 10–35)
Albumin: 3.9 g/dL (ref 3.6–5.1)
Alkaline phosphatase (APISO): 64 U/L (ref 37–153)
BUN: 14 mg/dL (ref 7–25)
CO2: 26 mmol/L (ref 20–32)
Calcium: 9.4 mg/dL (ref 8.6–10.4)
Chloride: 106 mmol/L (ref 98–110)
Creat: 0.73 mg/dL (ref 0.50–1.05)
Globulin: 4.2 g/dL — ABNORMAL HIGH (ref 1.9–3.7)
Glucose, Bld: 113 mg/dL — ABNORMAL HIGH (ref 65–99)
Potassium: 4.1 mmol/L (ref 3.5–5.3)
Sodium: 139 mmol/L (ref 135–146)
Total Bilirubin: 0.5 mg/dL (ref 0.2–1.2)
Total Protein: 8.1 g/dL (ref 6.1–8.1)

## 2023-03-27 LAB — CBC WITH DIFFERENTIAL/PLATELET
Absolute Lymphocytes: 2036 {cells}/uL (ref 850–3900)
Absolute Monocytes: 240 {cells}/uL (ref 200–950)
Basophils Absolute: 20 {cells}/uL (ref 0–200)
Basophils Relative: 0.5 %
Eosinophils Absolute: 108 {cells}/uL (ref 15–500)
Eosinophils Relative: 2.7 %
HCT: 38.1 % (ref 35.0–45.0)
Hemoglobin: 12.3 g/dL (ref 11.7–15.5)
MCH: 27.3 pg (ref 27.0–33.0)
MCHC: 32.3 g/dL (ref 32.0–36.0)
MCV: 84.5 fL (ref 80.0–100.0)
MPV: 11.3 fL (ref 7.5–12.5)
Monocytes Relative: 6 %
Neutro Abs: 1596 {cells}/uL (ref 1500–7800)
Neutrophils Relative %: 39.9 %
Platelets: 221 10*3/uL (ref 140–400)
RBC: 4.51 10*6/uL (ref 3.80–5.10)
RDW: 12.3 % (ref 11.0–15.0)
Total Lymphocyte: 50.9 %
WBC: 4 10*3/uL (ref 3.8–10.8)

## 2023-03-27 NOTE — Progress Notes (Signed)
TB Gold negative

## 2023-04-02 ENCOUNTER — Encounter: Payer: Self-pay | Admitting: Oncology

## 2023-04-05 NOTE — Progress Notes (Signed)
 [facility] [address] [place]. [place] [address] [phone]       OFFICE FOLLOW UP NOTE  Ms. QUINNLYN HEARNS Date of Birth:  [birth year] Medical Record Number:  [id number]    Primary neurologist: Dr. Frances Furbish Reason for visit: Initial CPAP follow-up, migraine headaches    SUBJECTIVE:   CHIEF COMPLAINT:  Chief Complaint  Patient presents with   Follow-up    Pt in 3, here alone   Pt is here for initial CPAP. Pt states she is having a problem with leaking with here mask, would like to change it.     Follow-up visit:  Prior visit: 10/02/2022 with Dr. Frances Furbish (consult visit)  Brief HPI:   KHRISTIAN SEALS is a 63 y.o. female with complex medical history of CAD, CHF, HTN, HLD, anemia, rheumatoid arthritis, Sjogren's disease, DM with diabetic neuropathy, asthma and severe obesity with BMI of over 40.  She was evaluated by Dr. Frances Furbish in 10/2022 for headaches.  Previously followed by Dr. Delena Bali.  Migraines previously controlled on Emgality but discontinued use for no specific reason.  Was evaluated in the ED 09/2022 for right sided headache and received migraine cocktail with resolution.  CTA head/neck benign.  At prior visit, recommended restarting Emgality for prevention and continue sumatriptan for rescue.  Also concern of possible underlying sleep disorder such as OSA and recommended proceeding with sleep study.  Sleep study 12/2022 showed moderate to severe OSA with total AHI of 21.8/h, REM AHI of 31.3/h, supine AHI of 26.8/h and O2 nadir of 83%.  Recommended initiation of CPAP, set up 01/2023.     Interval history:  Being seen today for initial CPAP compliance visit.  Has been having difficulty tolerating FFM, having frequent leaks, did have f/u with DME [employer] [facility] and received smaller mask but continues to have the same issue.  Has not noticed any significant benefit in regards to daytime energy levels and sleep quality.  ESS 10/24.  Notes some  improvement of migraine headaches since restarting Emgality but continues to have about 2/week, use of sumatriptan with benefit.  Reports increased stress can trigger migraines.  She is currently recovering from meniscus tear with repair in 01/2023, currently working with PT.         ROS:   14 system review of systems performed and negative with exception of those listed in HPI  PMH:  Past Medical History:  Diagnosis Date   Anemia    Arthritis    Asthma 05/03/2020   Back pain    Blood transfusion without reported diagnosis    CAD (coronary artery disease) 04/24/2017   Cataract    Cerebrovascular disease 10/13/2020   Chronic diastolic CHF (congestive heart failure) (HCC) 04/24/2017   Colon polyps    Controlled type 2 diabetes mellitus without complication, without long-term current use of insulin (HCC) 05/31/2017   Diabetic peripheral neuropathy (HCC) 01/05/2020   Ectopic pregnancy    RIGHT salpingostomy   Elective abortion    Elective abortion 09/26/2020   RIGHT salpingostomy   Essential hypertension 09/04/2017   GERD (gastroesophageal reflux disease)    Glaucoma    both eyes   Headache    migraines   History of chicken pox    History of chicken pox 09/26/2020   migraines   Hyperlipemia    Hypertension    Iron deficiency anemia 09/02/2013   Joint pain    Migraine 04/19/2014   Obesity (BMI 30-39.9) 10/17/2012   Palpitations 10/01/2012   Pre-diabetes  Rheumatoid arthritis (HCC) 11/07/2011   Rheumatoid arthritis(714.0) 11/07/2011   SAB (spontaneous abortion)    SAB (spontaneous abortion) 11/07/2011   Shortness of breath    Sjogren's disease (HCC)    Swelling of lower extremity    Vitamin B12 deficiency    Wheezing     PSH:  Past Surgical History:  Procedure Laterality Date   ABDOMINAL SURGERY     bowel repair   CATARACT EXTRACTION     02/25/2023, 03/11/2023   COLONOSCOPY     COLONOSCOPY  08/2022   DILATION AND CURETTAGE OF UTERUS     X 2   KNEE  ARTHROSCOPY W/ MENISCAL REPAIR Right 01/16/2023   MENISCUS REPAIR Right    MENISCUS TEAR REPAIR   MYOMECTOMY N/A 07/27/2014   Procedure: ABDOMINAL MYOMECTOMY;  Surgeon: Hoover Browns, MD;  Location: [facility];  Service: Gynecology;  Laterality: N/A;   PELVIC LAPAROSCOPY  1994   IN 1999 LAPAROSCOPY WITH INCIDENTAL ENTEROTOMY REQUIRING LAPAROTOMY   ROTATOR CUFF REPAIR Left 06/21/2021   UTERINE FIBROID SURGERY     July 2016    Social History:  Social History   Socioeconomic History   Marital status: Single    Spouse name: Not on file   Number of children: 0   Years of education: college   Highest education level: Associate degree: occupational, Scientist, product/process development, or vocational program  Occupational History   Occupation: LPN    Employer: [employer]  Tobacco Use   Smoking status: Never    Passive exposure: Never   Smokeless tobacco: Never  Vaping Use   Vaping status: Never Used  Substance and Sexual Activity   Alcohol use: No    Comment: occasional   Drug use: No   Sexual activity: Yes    Birth control/protection: None    Comment: 1st intercourse- 17, partners- 5  Other Topics Concern   Not on file  Social History Narrative   Regular exercise:  No   Caffeine Use: 1 cup coffee daily   No children   "Happily divorced"   Reports that she is involved at her church   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  Had a sister up in [place] who passed away 05-15-15 who she was close to.   Born in [place]- she came to [place] as adult.  Age 63.    Left-handed.         Social Drivers of Health   Financial Resource Strain: Medium Risk (01/25/2023)   Overall Financial Resource Strain (CARDIA)    Difficulty of Paying Living Expenses: Somewhat hard  Food Insecurity: No Food Insecurity (01/25/2023)   Hunger Vital Sign    Worried About Running Out of Food in the Last Year: Never true    Ran Out of Food in the Last Year: Never true  Transportation Needs: No Transportation Needs (01/25/2023)    PRAPARE - Administrator, Civil Service (Medical): No    Lack of Transportation (Non-Medical): No  Physical Activity: Inactive (01/25/2023)   Exercise Vital Sign    Days of Exercise per Week: 0 days    Minutes of Exercise per Session: 20 min  Stress: No Stress Concern Present (01/25/2023)   [employer] of Occupational Health - Occupational Stress Questionnaire    Feeling of Stress : Not at all  Social Connections: Moderately Integrated (01/25/2023)   Social Connection and Isolation Panel [NHANES]    Frequency of Communication with Friends and Family: More than three times a week  Frequency of Social Gatherings with Friends and Family: Once a week    Attends Religious Services: More than 4 times per year    Active Member of [employer] or Organizations: Yes    Attends Engineer, structural: More than 4 times per year    Marital Status: Divorced  Intimate Partner Violence: Unknown (04/04/2021)   Received from [employer]   HITS    Physically Hurt: Not on file    Insult or Talk Down To: Not on file    Threaten Physical Harm: Not on file    Scream or Curse: Not on file    Family History:  Family History  Problem Relation Age of Onset   Hypertension Mother        died from heart disease   Heart disease Mother        deceased, unknown cause   Stroke Mother    Obesity Mother    High Cholesterol Mother    Sudden death Father    Diabetes Sister    Hypertension Sister    Esophageal cancer Sister    Stomach cancer Sister    Cancer Sister    Diabetes Sister    Parkinson's disease Brother    Rectal cancer Neg Hx    Colon cancer Neg Hx    Colon polyps Neg Hx     Medications:   Current Outpatient Medications on File Prior to Visit  Medication Sig Dispense Refill   Abatacept (ORENCIA CLICKJECT) 125 MG/ML SOAJ Inject 125 mg into the skin every 7 (seven) days. 4 mL 1   albuterol (VENTOLIN HFA) 108 (90 Base) MCG/ACT inhaler Inhale 2 puffs into  the lungs every 6 (six) hours as needed. 18 g 0   aspirin EC 81 MG tablet Take 1 tablet (81 mg total) by mouth daily. 90 tablet 3   brimonidine-timolol (COMBIGAN) 0.2-0.5 % ophthalmic solution      calcium-vitamin D (OSCAL WITH D) 500-200 MG-UNIT TABS tablet Take by mouth.     carvedilol (COREG) 25 MG tablet Take 1 tablet (25 mg total) by mouth 2 (two) times daily with a meal. 60 tablet 5   diltiazem (CARTIA XT) 240 MG 24 hr capsule Take 1 capsule (240 mg total) by mouth daily. 90 capsule 1   fluticasone (FLONASE) 50 MCG/ACT nasal spray Place 2 sprays into both nostrils daily. (Patient taking differently: Place 2 sprays into both nostrils as needed.) 16 g 1   Galcanezumab-gnlm (EMGALITY) 120 MG/ML SOAJ Inject 120 mg into the skin every 30 (thirty) days. 1.12 mL 5   ibuprofen (ADVIL) 200 MG tablet Take 200 mg by mouth every 6 (six) hours as needed.     Iron, Ferrous Sulfate, 325 (65 Fe) MG TABS Take 325 mg by mouth 2 (two) times daily. 30 tablet    rosuvastatin (CRESTOR) 10 MG tablet Take 1 tablet by mouth once daily 30 tablet 0   SUMAtriptan (IMITREX) 100 MG tablet Take 1 tablet (100 mg total) by mouth as needed for migraine. May repeat in 2 hours if headache persists or recurs. 30 tablet 5   timolol (TIMOPTIC) 0.5 % ophthalmic solution 1 drop every morning.     TRAVATAN Z 0.004 % SOLN ophthalmic solution Place 1 drop into both eyes at bedtime.      triamterene-hydrochlorothiazide (MAXZIDE-25) 37.5-25 MG tablet Take 1 tablet by mouth daily. 90 tablet 1   valsartan (DIOVAN) 320 MG tablet Take 1 tablet (320 mg total) by mouth daily. 30 tablet 5  Vitamin D, Ergocalciferol, (DRISDOL) 1.25 MG (50000 UNIT) CAPS capsule Take 1 capsule (50,000 Units total) by mouth every 7 (seven) days. 5 capsule 0   prednisoLONE acetate (PRED FORTE) 1 % ophthalmic suspension SMARTSIG:In Eye(s) (Patient not taking: Reported on 04/08/2023)     tirzepatide [facility]) 2.5 MG/0.5ML Pen Inject 2.5 mg into the skin once a week.  (Patient not taking: Reported on 04/08/2023) 2 mL 0   No current facility-administered medications on file prior to visit.    Allergies:   Allergies  Allergen Reactions   Gabapentin     Made her "crazy"      OBJECTIVE:  Physical Exam  Vitals:   04/08/23 1240  BP: (!) 163/88  Pulse: 72  Weight: 206 lb (93.4 kg)  Height: 5' (1.524 m)   Body mass index is 40.23 kg/m. No results found.   General: well developed, well nourished, very pleasant middle-aged female, seated, in no evident distress  Neurologic Exam Mental Status: Awake and fully alert. Oriented to place and time. Recent and remote memory intact. Attention span, concentration and fund of knowledge appropriate. Mood and affect appropriate.  Cranial Nerves: Pupils equal, briskly reactive to light. Extraocular movements full without nystagmus. Visual fields full to confrontation. Hearing intact. Facial sensation intact. Face, tongue, palate moves normally and symmetrically.  Motor: Normal bulk and tone. Normal strength in all tested extremity muscles Gait and Station: Arises from chair without difficulty. Stance is normal. Gait demonstrates normal stride length and balance without use of AD with mild favoring of RLE        ASSESSMENT/PLAN: MIAH BOYE is a 63 y.o. year old female    OSA on CPAP :  Suboptimal compliance d/t difficulty tolerating FFM, will request change of interface to improvement tolerance and compliance Continue current pressure settings of 6-12 with EPR 3.   Discussed importance of nightly usage with ensuring greater than 4 hours nightly for optimal benefit and per insurance purposes.   Continue to follow with DME company for any needed supplies or CPAP related concerns Migraine headaches:  Improved on Emgality but still having about 8 migraines per month Prevention: Recommend switching CRGP from [employer] to [employer] [facility]: Continue sumatriptan as needed Previously tried/failed:  Topiramate, metoprolol, valsartan, lisinopril, Emgality, sumatriptan, Flexeril     Follow up in 6 months or call earlier if needed   CC:  PCP: Sandford Craze, NP    I spent 30 minutes of face-to-face and non-face-to-face time with patient.  This included previsit chart review, lab review, study review, order entry, electronic health record documentation, patient education and discussion regarding above diagnoses and treatment plan and answered all other questions to patient's satisfaction  Ihor Austin, [facility]  [facility] [address] [place], [place] [address]  Phone [phone] Fax [phone] Note: This document was prepared with digital dictation and possible smart phrase technology. Any transcriptional errors that result from this process are unintentional.

## 2023-04-08 ENCOUNTER — Encounter: Payer: Self-pay | Admitting: Adult Health

## 2023-04-08 ENCOUNTER — Ambulatory Visit: Payer: Managed Care, Other (non HMO) | Admitting: Adult Health

## 2023-04-08 VITALS — BP 163/88 | HR 72 | Ht 60.0 in | Wt 206.0 lb

## 2023-04-08 DIAGNOSIS — G4733 Obstructive sleep apnea (adult) (pediatric): Secondary | ICD-10-CM

## 2023-04-08 DIAGNOSIS — G43719 Chronic migraine without aura, intractable, without status migrainosus: Secondary | ICD-10-CM

## 2023-04-08 MED ORDER — AJOVY 225 MG/1.5ML ~~LOC~~ SOAJ: 225.0000 mg | SUBCUTANEOUS | 11 refills | Status: DC

## 2023-04-08 NOTE — Patient Instructions (Addendum)
 Your Plan:  Will request change of mask with your DME company - they will reach out to you to set up an appointment  Ensure nightly use of CPAP for adequate sleep apnea management with ensuring greater than 4 hours per night for optimal benefit  Continue to follow with your DME company for any new supplies or CPAP related concerns  Stop Emgality and replace with Ajovy for migraine prevention and continue sumatriptan for rescue      Follow-up in 6 months or call earlier if needed      Thank you for coming to see [place] at [facility]. I hope we have been able to provide you high quality care today.  You may receive a patient satisfaction survey over the next few weeks. We would appreciate your feedback and comments so that we may continue to improve ourselves and the health of our patients.

## 2023-04-12 ENCOUNTER — Other Ambulatory Visit: Payer: Self-pay | Admitting: Family

## 2023-04-15 ENCOUNTER — Other Ambulatory Visit (HOSPITAL_COMMUNITY): Payer: Self-pay

## 2023-04-15 ENCOUNTER — Other Ambulatory Visit: Payer: Self-pay | Admitting: Bariatrics

## 2023-04-15 ENCOUNTER — Telehealth: Payer: Self-pay

## 2023-04-15 ENCOUNTER — Telehealth: Payer: Self-pay | Admitting: Adult Health

## 2023-04-15 ENCOUNTER — Encounter: Payer: Self-pay | Admitting: Oncology

## 2023-04-15 NOTE — Telephone Encounter (Signed)
 Pharmacy Patient Advocate Encounter  Received notification from [facility] that Prior Authorization for AJOVY (fremanezumab-vfrm) injection 225MG /1.5ML auto-injectors has been APPROVED from 04/15/2023 to 04/14/2024. Ran test claim, Copay is $24.98. This test claim was processed through [facility]- copay amounts may vary at other pharmacies due to pharmacy/plan contracts, or as the patient moves through the different stages of their insurance plan.   PA #/Case ID/Reference #: PA Case ID #: [id number]

## 2023-04-15 NOTE — Telephone Encounter (Signed)
 Pt called stating that she called the DME and they informed her that they have not received a Rx for a change of mask. Pt would like to know if it can be sent so that she can schedule an appt with the DME. Please advise.

## 2023-04-16 ENCOUNTER — Encounter: Payer: Self-pay | Admitting: Oncology

## 2023-04-23 ENCOUNTER — Ambulatory Visit: Admitting: Bariatrics

## 2023-04-24 ENCOUNTER — Ambulatory Visit (INDEPENDENT_AMBULATORY_CARE_PROVIDER_SITE_OTHER): Admitting: Physician Assistant

## 2023-04-24 ENCOUNTER — Encounter (INDEPENDENT_AMBULATORY_CARE_PROVIDER_SITE_OTHER): Payer: Self-pay | Admitting: Physician Assistant

## 2023-04-24 VITALS — BP 128/77 | HR 63 | Temp 98.5°F | Ht 65.5 in | Wt 203.0 lb

## 2023-04-24 DIAGNOSIS — E559 Vitamin D deficiency, unspecified: Secondary | ICD-10-CM

## 2023-04-24 DIAGNOSIS — Z6833 Body mass index (BMI) 33.0-33.9, adult: Secondary | ICD-10-CM | POA: Diagnosis not present

## 2023-04-24 DIAGNOSIS — E119 Type 2 diabetes mellitus without complications: Secondary | ICD-10-CM | POA: Diagnosis not present

## 2023-04-24 DIAGNOSIS — E669 Obesity, unspecified: Secondary | ICD-10-CM

## 2023-04-24 DIAGNOSIS — Z7985 Long-term (current) use of injectable non-insulin antidiabetic drugs: Secondary | ICD-10-CM

## 2023-04-24 DIAGNOSIS — Z6839 Body mass index (BMI) 39.0-39.9, adult: Secondary | ICD-10-CM

## 2023-04-24 MED ORDER — TIRZEPATIDE 2.5 MG/0.5ML ~~LOC~~ SOAJ: 2.5000 mg | SUBCUTANEOUS | 0 refills | Status: DC

## 2023-04-24 MED ORDER — VITAMIN D (ERGOCALCIFEROL) 1.25 MG (50000 UNIT) PO CAPS: 50000.0000 [IU] | ORAL_CAPSULE | ORAL | 0 refills | Status: DC

## 2023-04-24 NOTE — Progress Notes (Signed)
 SUBJECTIVE: Discussed the use of AI scribe software for clinical note transcription with the patient, who gave verbal consent to proceed.  Chief Complaint: Obesity  Interim History: She is down 3 lbs since her last visit with Dr. Bevin Bucks at [place].   Knee surgery for meniscal tear 01/16/23- Dr. Jaci Martini is here to discuss her progress with her obesity treatment plan. She is on the Category 2 Plan and states she is not following her eating plan approximately 0 % of the time. She states she is exercising physical therapy for her knee 45 minutes 3 times per week.  Amber [name] is a 63 year old female with obesity who presents for follow-up of her obesity treatment plan.  She is currently on Mounjaro  2.5 mg weekly for weight management and has experienced a weight loss of three pounds in the past month. However, she has not adhered to her nutrition plan since her knee surgery due to limited food choices. Now that she is driving again, she has more control over her diet.  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 Post-surgery, she experiences knee swelling and has changed her shoes to manage this. She is not using compression garments and is currently attending therapy sessions.  She has been avoiding sweets and coffee, which she believes contributed to her recent weight loss. She is also on vitamin D  supplementation and reports no issues such as nausea, vomiting, or muscle weakness. Her vitamin D  levels indicate a need for continued supplementation.  She is adjusting her meal plan to accommodate her night shift schedule, ensuring adequate protein intake. Before her surgery, she ate breakfast at 4 AM, allowing her to go to bed upon returning home from work without eating.   OBJECTIVE: Visit Diagnoses: Problem List Items Addressed This Visit     Controlled type 2 diabetes mellitus without complication, without long-term current use  of insulin  (HCC) - Primary   Relevant Medications   tirzepatide  (MOUNJARO ) 2.5 MG/0.5ML Pen   Other Visit Diagnoses       Vitamin D  deficiency         Obesity (HCC)- Start BMI 41.11 Date-12/05/22 at [facility]       Relevant Medications   tirzepatide  (MOUNJARO ) 2.5 MG/0.5ML Pen     BMI 39.0-39.9,adult Current BMI 39.1         Obesity  Weight loss of 3 pounds achieved, but dietary adherence remains challenging post-surgery.  Some loss of muscle mass with recent surgery and increased adipose off Mounjaro  post surgery.  She has resumed driving and work, providing more control over dietary choices. Emphasized the importance of protein intake and meal planning, especially considering night shift work. Encouraged to avoid sweets and consider using Fairlife milk for additional protein intake. - Resume Mounjaro  2.5 mg weekly. - Provide meal plan and grocery list.- category 2 - Encourage adherence to meal plan with focus on protein intake. - Discuss potential use of Fairlife milk for additional protein intake. - Schedule follow-up appointment for next 2-3 weeks with available provider.   Type 2 Diabetes Mellitus with other specified complication, without long-term current use of insulin   Medication(s): None since surgery in January 2025. Interested in resuming Mounjaro .  We have reviewed the risks and benefits of using a GLP-1. The patient denies a personal or family history of medullary thyroid  cancer or MENII. The patient denies a history of pancreatitis. The potential risks and benefits of this GLP-1 were  reviewed with the patient, and alternative treatment options were discussed. All questions were answered, and the patient wishes to move forward with this medication.  She tolerated Mounjaro  well when previously started, but got off track with her plan following knee surgery/ could not drive, etc.  Ready to resume. Denies mass in neck, dysphagia, dyspepsia, persistent hoarseness, abdominal pain,  or N/V/Constipation or diarrhea. Has annual eye exam. Mood is stable.    Lab Results  Component Value Date   HGBA1C 6.4 01/25/2023   HGBA1C 6.7 (H) 10/17/2022   HGBA1C 6.7 (H) 07/17/2022   Lab Results  Component Value Date   MICROALBUR 1.0 03/07/2022   LDLCALC 49 12/05/2022   CREATININE 0.73 03/25/2023   Lab Results  Component Value Date   GFR 91.85 10/17/2022   GFR 93.59 07/17/2022   GFR 93.83 03/07/2022    Plan: Start Mounjaro  2.5 mg SQ weekly She is working  on nutrition plan to decrease simple carbohydrates, increase lean proteins and exercise to promote weight loss and improve glycemic control . Monitor response to Mounjaro  and titrate dose accordingly.  Recheck labs over the next 2 months.  Meds ordered this encounter  Medications   tirzepatide  (MOUNJARO ) 2.5 MG/0.5ML Pen    Sig: Inject 2.5 mg into the skin once a week.    Dispense:  2 mL    Refill:  0   Vitamin D , Ergocalciferol , (DRISDOL ) 1.25 MG (50000 UNIT) CAPS capsule    Sig: Take 1 capsule (50,000 Units total) by mouth every 7 (seven) days.    Dispense:  5 capsule    Refill:  0    Vitamin D  deficiency Vitamin D  deficiency requiring ongoing supplementation with Ergocalciferol  50,000 units weekly. No reported side effects from current vitamin D  supplementation- no N/V or muscle weakness.  Last vitamin D  Lab Results  Component Value Date   VD25OH 29.4 (L) 12/05/2022   Low vitamin D  levels can be associated with adiposity and may result in leptin resistance and weight gain. Also associated with fatigue.  Currently on vitamin D  supplementation without any adverse effects such as nausea, vomiting or muscle weakness.  Refill Ergocalciferol  50,000 units weekly.   Vitals Temp: 98.5 F (36.9 C) BP: 128/77 Pulse Rate: 63 SpO2: 100 %   Anthropometric Measurements Height: 5' 5.5" (1.664 m) Weight: 203 lb (92.1 kg) BMI (Calculated): 33.26 Weight at Last Visit: 206 lb Weight Lost Since Last Visit: 3  lb Weight Gained Since Last Visit: 0 Starting Weight: 214 lb Total Weight Loss (lbs): 11 lb (4.99 kg) Peak Weight: 218 lb   Body Composition  Body Fat %: 49.7 % Fat Mass (lbs): 101.2 lbs Muscle Mass (lbs): 97.2 lbs Total Body Water (lbs): 75.8 lbs Visceral Fat Rating : 16   Other Clinical Data Fasting: no Labs: no Today's Visit #: 4 Starting Date: 12/05/22     ASSESSMENT AND PLAN:  Diet: Amber [name] is currently in the action stage of change. As such, her goal is to get back to weight loss efforts. She has agreed to Category 2 Plan.  Exercise: Amber [name] has been instructed  continues PT after knee surgery  for weight loss and overall health benefits.   Behavior Modification:  We discussed the following Behavioral Modification Strategies today: increasing lean protein intake, decreasing simple carbohydrates, increasing vegetables, increase H2O intake, increase high fiber foods, no skipping meals, better snacking choices, avoiding temptations, and planning for success. We discussed various medication options to help Amber [name] with her weight loss efforts and we both  agreed to continue to work on nutritional and behavioral strategies to promote weight loss.  And restart Mounjaro 2.5 mg weekly for Type 2 diabetes management.  Return in about 3 weeks (around 05/15/2023).Aaron Aas She was informed of the importance of frequent follow up visits to maximize her success with intensive lifestyle modifications for her multiple health conditions.  Attestation Statements:   Reviewed by clinician on day of visit: allergies, medications, problem list, medical history, surgical history, family history, social history, and previous encounter notes.   Time spent on visit including pre-visit chart review and post-visit care and charting was 25 minutes.    Britain Anagnos, PA-C

## 2023-04-26 ENCOUNTER — Ambulatory Visit: Payer: Managed Care, Other (non HMO) | Admitting: Family

## 2023-04-26 VITALS — BP 137/73 | HR 62 | Temp 98.6°F | Resp 16 | Ht 65.5 in | Wt 205.0 lb

## 2023-04-26 DIAGNOSIS — E538 Deficiency of other specified B group vitamins: Secondary | ICD-10-CM

## 2023-04-26 DIAGNOSIS — Z7985 Long-term (current) use of injectable non-insulin antidiabetic drugs: Secondary | ICD-10-CM

## 2023-04-26 DIAGNOSIS — D500 Iron deficiency anemia secondary to blood loss (chronic): Secondary | ICD-10-CM

## 2023-04-26 DIAGNOSIS — G43009 Migraine without aura, not intractable, without status migrainosus: Secondary | ICD-10-CM

## 2023-04-26 DIAGNOSIS — I10 Essential (primary) hypertension: Secondary | ICD-10-CM | POA: Diagnosis not present

## 2023-04-26 DIAGNOSIS — E119 Type 2 diabetes mellitus without complications: Secondary | ICD-10-CM

## 2023-04-26 DIAGNOSIS — G4733 Obstructive sleep apnea (adult) (pediatric): Secondary | ICD-10-CM

## 2023-04-26 DIAGNOSIS — M0579 Rheumatoid arthritis with rheumatoid factor of multiple sites without organ or systems involvement: Secondary | ICD-10-CM

## 2023-04-26 DIAGNOSIS — E559 Vitamin D deficiency, unspecified: Secondary | ICD-10-CM

## 2023-04-26 LAB — COMPREHENSIVE METABOLIC PANEL WITH GFR
ALT: 16 U/L (ref 0–35)
AST: 15 U/L (ref 0–37)
Albumin: 4.1 g/dL (ref 3.5–5.2)
Alkaline Phosphatase: 56 U/L (ref 39–117)
BUN: 12 mg/dL (ref 6–23)
CO2: 29 meq/L (ref 19–32)
Calcium: 9.5 mg/dL (ref 8.4–10.5)
Chloride: 102 meq/L (ref 96–112)
Creatinine, Ser: 0.72 mg/dL (ref 0.40–1.20)
GFR: 89.99 mL/min (ref >60.00)
Glucose, Bld: 65 mg/dL — ABNORMAL LOW (ref 70–99)
Potassium: 4.1 meq/L (ref 3.5–5.1)
Sodium: 138 meq/L (ref 135–145)
Total Bilirubin: 0.7 mg/dL (ref 0.2–1.2)
Total Protein: 7.9 g/dL (ref 6.0–8.3)

## 2023-04-26 LAB — HEMOGLOBIN A1C: Hgb A1c MFr Bld: 6.4 % (ref 4.6–6.5)

## 2023-04-26 MED ORDER — SYRINGE 22G X 1-1/2" 3 ML MISC: 0 refills | Status: AC

## 2023-04-26 MED ORDER — CYANOCOBALAMIN 1000 MCG/ML IJ SOLN
1000.0000 ug | Freq: Once | INTRAMUSCULAR | Status: AC
  Administered 2023-04-26: 1000 ug via INTRAMUSCULAR

## 2023-04-26 MED ORDER — CYANOCOBALAMIN 1000 MCG/ML IJ SOLN: INTRAMUSCULAR | 0 refills | Status: DC

## 2023-04-26 MED ORDER — VALSARTAN 320 MG PO TABS: 320.0000 mg | ORAL_TABLET | Freq: Every day | ORAL | 1 refills | Status: DC

## 2023-04-26 NOTE — Assessment & Plan Note (Addendum)
 Lab Results  Component Value Date   HGBA1C 6.4 01/25/2023   HGBA1C 6.7 (H) 10/17/2022   HGBA1C 6.7 (H) 07/17/2022   Lab Results  Component Value Date   MICROALBUR 1.0 03/07/2022   LDLCALC 49 12/05/2022   CREATININE 0.73 03/25/2023   Wt Readings from Last 3 Encounters:  04/26/23 205 lb (93 kg)  04/24/23 203 lb (92.1 kg)  04/08/23 206 lb (93.4 kg)   At goal on Mounjaro.

## 2023-04-26 NOTE — Progress Notes (Signed)
 Subjective:     Patient ID: Amber [name], female    DOB: 22-Aug-1960, 63 y.o.   MRN: [id number]  Chief Complaint  Patient presents with   Hypertension    Here for follow up    HPI  Discussed the use of AI scribe software for clinical note transcription with the patient, who gave verbal consent to proceed.  History of Present Illness Amber [name] is a 63 year old female with rheumatoid arthritis, sleep apnea, and migraines who presents for follow-up.  She is experiencing ongoing issues with her rheumatoid arthritis medication, Orencia , due to recent changes at her pharmacy causing delays in access.  Her sleep apnea is managed with a CPAP machine, but she is experiencing mask leakage after one to two weeks of use. She is awaiting a new mask, possibly a different size or type, such as a nose pillow.  For her migraines, she was previously on Emgality  but has switched to Ajovy , noting some improvement since the switch.  She continues to take an iron  supplement for iron  deficiency anemia. Her blood pressure is well-controlled with carvedilol , diltiazem , triamterene , hydrochlorothiazide , and valsartan , with a recent reading of 137/73 mmHg.  Her A1c was last recorded at 6.4, and she has recently started Mounjaro , which she paused due to knee surgery on January 16, 2023. She is currently undergoing therapy.  She mentions a need for B12 injections, which were missed during her last visit, and is open to administering these at home, depending on insurance coverage.   Lab Results  Component Value Date   CHOL 120 12/05/2022   HDL 54 12/05/2022   LDLCALC 49 12/05/2022   TRIG 85 12/05/2022   CHOLHDL 3 07/17/2022        Health Maintenance Due  Topic Date Due   FOOT EXAM  03/07/2023   COVID-19 Vaccine (6 - Moderna risk 2024-25 season) 04/02/2023    Past Medical History:  Diagnosis Date   Anemia    Arthritis    Asthma 05/03/2020   Back pain    Blood transfusion  without reported diagnosis    CAD (coronary artery disease) 04/24/2017   Cataract    Cerebrovascular disease 10/13/2020   Chronic diastolic CHF (congestive heart failure) (HCC) 04/24/2017   Colon polyps    Controlled type 2 diabetes mellitus without complication, without long-term current use of insulin  (HCC) 05/31/2017   Diabetic peripheral neuropathy (HCC) 01/05/2020   Ectopic pregnancy    RIGHT salpingostomy   Elective abortion    Elective abortion 09/26/2020   RIGHT salpingostomy   Essential hypertension 09/04/2017   GERD (gastroesophageal reflux disease)    Glaucoma    both eyes   Headache    migraines   History of chicken pox    History of chicken pox 09/26/2020   migraines   Hyperlipemia    Hypertension    Iron  deficiency anemia 09/02/2013   Joint pain    Migraine 04/19/2014   Obesity (BMI 30-39.9) 10/17/2012   Palpitations 10/01/2012   Pre-diabetes    Rheumatoid arthritis (HCC) 11/07/2011   Rheumatoid arthritis(714.0) 11/07/2011   SAB (spontaneous abortion)    SAB (spontaneous abortion) 11/07/2011   Shortness of breath    Sjogren's disease (HCC)    Swelling of lower extremity    Vitamin B12 deficiency    Wheezing     Past Surgical History:  Procedure Laterality Date   ABDOMINAL SURGERY     bowel repair   CATARACT EXTRACTION     02/25/2023,  03/11/2023   COLONOSCOPY     COLONOSCOPY  08/2022   DILATION AND CURETTAGE OF UTERUS     X 2   KNEE ARTHROSCOPY W/ MENISCAL REPAIR Right 01/16/2023   MENISCUS REPAIR Right    MENISCUS TEAR REPAIR   MYOMECTOMY N/A 07/27/2014   Procedure: ABDOMINAL MYOMECTOMY;  Surgeon: Vernal Gold, MD;  Location: [facility];  Service: Gynecology;  Laterality: N/A;   PELVIC LAPAROSCOPY  1994   IN 1999 LAPAROSCOPY WITH INCIDENTAL ENTEROTOMY REQUIRING LAPAROTOMY   ROTATOR CUFF REPAIR Left 06/21/2021   UTERINE FIBROID SURGERY     July 2016    Family History  Problem Relation Age of Onset   Hypertension Mother        died from heart  disease   Heart disease Mother        deceased, unknown cause   Stroke Mother    Obesity Mother    High Cholesterol Mother    Sudden death Father    Diabetes Sister    Hypertension Sister    Esophageal cancer Sister    Stomach cancer Sister    Cancer Sister    Diabetes Sister    Parkinson's disease Brother    Rectal cancer Neg Hx    Colon cancer Neg Hx    Colon polyps Neg Hx     Social History   Socioeconomic History   Marital status: Single    Spouse name: Not on file   Number of children: 0   Years of education: college   Highest education level: Associate degree: occupational, Scientist, product/process development, or vocational program  Occupational History   Occupation: LPN    Employer: [employer]  Tobacco Use   Smoking status: Never    Passive exposure: Never   Smokeless tobacco: Never  Vaping Use   Vaping status: Never Used  Substance and Sexual Activity   Alcohol use: No    Comment: occasional   Drug use: No   Sexual activity: Yes    Birth control/protection: None    Comment: 1st intercourse- 17, partners- 5  Other Topics Concern   Not on file  Social History Narrative   Regular exercise:  No   Caffeine Use: 1 cup coffee daily   No children   "Happily divorced"   Reports that she is involved at her church   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  Had a sister up in [place]  who passed away 2015-06-29 who she was close to.   Born in [place]- she came to [place]  as adult.  Age 76.    Left-handed.         Social Drivers of Health   Financial Resource Strain: Medium Risk (01/25/2023)   Overall Financial Resource Strain (CARDIA)    Difficulty of Paying Living Expenses: Somewhat hard  Food Insecurity: No Food Insecurity (01/25/2023)   Hunger Vital Sign    Worried About Running Out of Food in the Last Year: Never true    Ran Out of Food in the Last Year: Never true  Transportation Needs: No Transportation Needs (01/25/2023)   PRAPARE - Scientist, research (physical sciences) (Medical): No    Lack of Transportation (Non-Medical): No  Physical Activity: Inactive (01/25/2023)   Exercise Vital Sign    Days of Exercise per Week: 0 days    Minutes of Exercise per Session: 20 min  Stress: No Stress Concern Present (01/25/2023)   [employer] of Occupational Health - Occupational Stress Questionnaire  Feeling of Stress : Not at all  Social Connections: Moderately Integrated (01/25/2023)   Social Connection and Isolation Panel [NHANES]    Frequency of Communication with Friends and Family: More than three times a week    Frequency of Social Gatherings with Friends and Family: Once a week    Attends Religious Services: More than 4 times per year    Active Member of [employer] or Organizations: Yes    Attends Banker Meetings: More than 4 times per year    Marital Status: Divorced  Intimate Partner Violence: Unknown (04/04/2021)   Received from [facility]   HITS    Physically Hurt: Not on file    Insult or Talk Down To: Not on file    Threaten Physical Harm: Not on file    Scream or Curse: Not on file    Outpatient Medications Prior to Visit  Medication Sig Dispense Refill   Abatacept  (ORENCIA  CLICKJECT) 125 MG/ML SOAJ Inject 125 mg into the skin every 7 (seven) days. 4 mL 1   albuterol  (VENTOLIN  HFA) 108 (90 Base) MCG/ACT inhaler Inhale 2 puffs into the lungs every 6 (six) hours as needed. 18 g 0   aspirin  EC 81 MG tablet Take 1 tablet (81 mg total) by mouth daily. 90 tablet 3   brimonidine-timolol (COMBIGAN) 0.2-0.5 % ophthalmic solution      calcium -vitamin D  (OSCAL WITH D) 500-200 MG-UNIT TABS tablet Take by mouth.     carvedilol  (COREG ) 25 MG tablet Take 1 tablet (25 mg total) by mouth 2 (two) times daily with a meal. 60 tablet 5   diltiazem  (CARTIA  XT) 240 MG 24 hr capsule Take 1 capsule (240 mg total) by mouth daily. 90 capsule 1   fluticasone  (FLONASE ) 50 MCG/ACT nasal spray Place 2 sprays into both nostrils  daily. (Patient taking differently: Place 2 sprays into both nostrils as needed.) 16 g 1   Fremanezumab -vfrm (AJOVY ) 225 MG/1.5ML SOAJ Inject 225 mg into the skin every 30 (thirty) days. 1.68 mL 11   ibuprofen  (ADVIL ) 200 MG tablet Take 200 mg by mouth every 6 (six) hours as needed.     Iron , Ferrous Sulfate , 325 (65 Fe) MG TABS Take 325 mg by mouth 2 (two) times daily. 30 tablet    rosuvastatin  (CRESTOR ) 10 MG tablet Take 1 tablet by mouth once daily 30 tablet 0   SUMAtriptan  (IMITREX ) 100 MG tablet Take 1 tablet (100 mg total) by mouth as needed for migraine. May repeat in 2 hours if headache persists or recurs. 30 tablet 5   timolol (TIMOPTIC) 0.5 % ophthalmic solution 1 drop every morning.     tirzepatide  (MOUNJARO ) 2.5 MG/0.5ML Pen Inject 2.5 mg into the skin once a week. 2 mL 0   TRAVATAN Z 0.004 % SOLN ophthalmic solution Place 1 drop into both eyes at bedtime.      triamterene -hydrochlorothiazide  (MAXZIDE-25) 37.5-25 MG tablet Take 1 tablet by mouth daily. 90 tablet 1   Vitamin D , Ergocalciferol , (DRISDOL ) 1.25 MG (50000 UNIT) CAPS capsule Take 1 capsule (50,000 Units total) by mouth every 7 (seven) days. 5 capsule 0   valsartan  (DIOVAN ) 320 MG tablet Take 1 tablet (320 mg total) by mouth daily. 30 tablet 5   No facility-administered medications prior to visit.    Allergies  Allergen Reactions   Gabapentin      Made her "crazy"    ROS See HPI    Objective:    Physical Exam Constitutional:  General: She is not in acute distress.    Appearance: Normal appearance. She is well-developed.  HENT:     Head: Normocephalic and atraumatic.     Right Ear: External ear normal.     Left Ear: External ear normal.  Eyes:     General: No scleral icterus. Neck:     Thyroid : No thyromegaly.  Cardiovascular:     Rate and Rhythm: Normal rate and regular rhythm.     Heart sounds: Normal heart sounds. No murmur heard. Pulmonary:     Effort: Pulmonary effort is normal. No respiratory  distress.     Breath sounds: Normal breath sounds. No wheezing.  Musculoskeletal:     Cervical back: Neck supple.  Skin:    General: Skin is warm and dry.  Neurological:     Mental Status: She is alert and oriented to person, place, and time.  Psychiatric:        Mood and Affect: Mood normal.        Behavior: Behavior normal.        Thought Content: Thought content normal.        Judgment: Judgment normal.      BP 137/73 (BP Location: Right Arm, Patient Position: Sitting, Cuff Size: Large)   Pulse 62   Temp 98.6 F (37 C) (Oral)   Resp 16   Ht 5' 5.5" (1.664 m)   Wt 205 lb (93 kg)   LMP 08/09/2016   SpO2 99%   BMI 33.59 kg/m  Wt Readings from Last 3 Encounters:  04/26/23 205 lb (93 kg)  04/24/23 203 lb (92.1 kg)  04/08/23 206 lb (93.4 kg)       Assessment & Plan:   Problem List Items Addressed This Visit       Unprioritized   Vitamin D  insufficiency   On supplement.  This is being managed by healthy weight and wellness.       Rheumatoid arthritis (HCC)   Continues to follow with Rheumatology and is maintained on Orencia .        OSA (obstructive sleep apnea)   Working with pulmonary to optimize mask.        Migraine   Improved with switch from Emgality  to Ajovy . Management per neuro.      Relevant Medications   valsartan  (DIOVAN ) 320 MG tablet   Iron  deficiency anemia   Lab Results  Component Value Date   WBC 4.0 03/25/2023   HGB 12.3 03/25/2023   HCT 38.1 03/25/2023   MCV 84.5 03/25/2023   PLT 221 03/25/2023   No longer anemic.Continues iron  supplement. Monitor.       Relevant Medications   cyanocobalamin  (VITAMIN B12) 1000 MCG/ML injection   Essential hypertension   BP Readings from Last 3 Encounters:  04/26/23 137/73  04/24/23 128/77  04/08/23 (!) 163/88   BP stable. Continue Diovan  and Diltiazem .      Relevant Medications   valsartan  (DIOVAN ) 320 MG tablet   Controlled type 2 diabetes mellitus without complication, without  long-term current use of insulin  (HCC) - Primary   Lab Results  Component Value Date   HGBA1C 6.4 01/25/2023   HGBA1C 6.7 (H) 10/17/2022   HGBA1C 6.7 (H) 07/17/2022   Lab Results  Component Value Date   MICROALBUR 1.0 03/07/2022   LDLCALC 49 12/05/2022   CREATININE 0.73 03/25/2023   Wt Readings from Last 3 Encounters:  04/26/23 205 lb (93 kg)  04/24/23 203 lb (92.1 kg)  04/08/23 206 lb (93.4 kg)  At goal on Mounjaro .      Relevant Medications   valsartan  (DIOVAN ) 320 MG tablet   Other Relevant Orders   HgB A1c (Completed)   Comp Met (CMET) (Completed)   B12 deficiency   Relevant Medications   cyanocobalamin  (VITAMIN B12) 1000 MCG/ML injection   Syringe/Needle, Disp, (SYRINGE 3CC/22GX1-1/2") 22G X 1-1/2" 3 ML MISC    I am having Amber [name] start on cyanocobalamin  and SYRINGE 3CC/22GX1-1/2". I am also having her maintain her Travatan Z, calcium -vitamin D , aspirin  EC, Iron  (Ferrous Sulfate ), fluticasone , albuterol , SUMAtriptan , diltiazem , ibuprofen , timolol, triamterene -hydrochlorothiazide , carvedilol , Orencia  ClickJect, rosuvastatin , brimonidine-timolol, Ajovy , tirzepatide , Vitamin D  (Ergocalciferol ), and valsartan . We administered cyanocobalamin .  Meds ordered this encounter  Medications   cyanocobalamin  (VITAMIN B12) 1000 MCG/ML injection    Sig: 1000mcg IM weekly x 4 weeks then once monthly    Dispense:  12 mL    Refill:  0    Supervising Provider:   Randie Bustle A [[id number]]   Syringe/Needle, Disp, (SYRINGE 3CC/22GX1-1/2") 22G X 1-1/2" 3 ML MISC    Sig: Use as directed    Dispense:  50 each    Refill:  0    Supervising Provider:   Randie Bustle A [[id number]]   valsartan  (DIOVAN ) 320 MG tablet    Sig: Take 1 tablet (320 mg total) by mouth daily.    Dispense:  90 tablet    Refill:  1    Supervising Provider:   Randie Bustle A [[id number]]   cyanocobalamin  (VITAMIN B12) injection 1,000 mcg

## 2023-04-28 ENCOUNTER — Encounter: Payer: Self-pay | Admitting: Family

## 2023-04-29 NOTE — Assessment & Plan Note (Signed)
 Improved with switch from Emgality  to Ajovy . Management per neuro.

## 2023-04-29 NOTE — Assessment & Plan Note (Signed)
 On supplement.  This is being managed by healthy weight and wellness.

## 2023-04-29 NOTE — Assessment & Plan Note (Addendum)
 Lab Results  Component Value Date   WBC 4.0 03/25/2023   HGB 12.3 03/25/2023   HCT 38.1 03/25/2023   MCV 84.5 03/25/2023   PLT 221 03/25/2023   No longer anemic.Continues iron  supplement. Monitor.

## 2023-04-29 NOTE — Assessment & Plan Note (Signed)
 Continues to follow with Rheumatology and is maintained on Orencia .

## 2023-04-29 NOTE — Assessment & Plan Note (Signed)
 BP Readings from Last 3 Encounters:  04/26/23 137/73  04/24/23 128/77  04/08/23 (!) 163/88   BP stable. Continue Diovan  and Diltiazem .

## 2023-04-29 NOTE — Assessment & Plan Note (Signed)
 Working with pulmonary to optimize mask.

## 2023-04-29 NOTE — Patient Instructions (Signed)
 VISIT SUMMARY:  Today, we reviewed your ongoing health conditions, including rheumatoid arthritis, sleep apnea, migraines, type 2 diabetes, hypertension, iron  deficiency anemia, and vitamin B12 deficiency. We discussed your current treatments and made some adjustments to better manage your symptoms and improve your overall health.  YOUR PLAN:  -TYPE 2 DIABETES MELLITUS: Type 2 diabetes is a condition where your body does not use insulin  properly, leading to high blood sugar levels. Your A1c level is well-controlled at 6.4%. We will perform a foot exam and order a metabolic panel to monitor your condition.  -HYPERTENSION: Hypertension, or high blood pressure, is well-controlled with your current medications, with a recent reading of 137/73 mmHg.  -RHEUMATOID ARTHRITIS: Rheumatoid arthritis is an autoimmune condition that causes joint inflammation. Your condition is well-managed, but you are experiencing issues with your Orencia  medication due to pharmacy delays.  -SLEEP APNEA: Sleep apnea is a condition where breathing repeatedly stops and starts during sleep. You are experiencing mask leakage with your CPAP machine and are trying a new nose pillow mask to improve the fit.  -MIGRAINE: Migraines are severe headaches often accompanied by nausea and sensitivity to light. Your migraines have improved since switching to Ajovy .  -IRON  DEFICIENCY ANEMIA: Iron  deficiency anemia is a condition where there is a lack of iron  in the body, leading to fewer red blood cells. You are continuing with your iron  supplementation.  -VITAMIN B12 DEFICIENCY: Vitamin B12 deficiency can cause fatigue and weakness. You missed your B12 injection at the last visit. We will administer a B12 injection today, send a prescription to the pharmacy, and provide syringes for home administration. You will need weekly B12 injections for four weeks, then monthly.  INSTRUCTIONS:  Please follow up with the recommended foot exam and  metabolic panel for your diabetes. Continue using your CPAP machine and try the new nose pillow mask to see if it helps with the leakage. Administer the B12 injections as instructed: weekly for four weeks, then monthly. If you have any issues with your medications or equipment, please contact our office.

## 2023-05-07 ENCOUNTER — Encounter: Payer: Self-pay | Admitting: Family

## 2023-05-07 ENCOUNTER — Ambulatory Visit (INDEPENDENT_AMBULATORY_CARE_PROVIDER_SITE_OTHER): Admitting: Family

## 2023-05-07 VITALS — BP 121/64 | HR 68 | Temp 98.8°F | Resp 12 | Ht 65.5 in | Wt 208.8 lb

## 2023-05-07 DIAGNOSIS — R059 Cough, unspecified: Secondary | ICD-10-CM

## 2023-05-07 DIAGNOSIS — J069 Acute upper respiratory infection, unspecified: Secondary | ICD-10-CM

## 2023-05-07 LAB — POC COVID19 BINAXNOW: SARS Coronavirus 2 Ag: NEGATIVE

## 2023-05-07 NOTE — Progress Notes (Unsigned)
 Subjective:     [name], female    DOB: 01/12/1960, 63 y.o.   MRN: [id number]  Chief Complaint  [name] presents with   Cough   chest congestion   Fever    Cough Associated symptoms include a fever.  Fever  Associated symptoms include coughing.    Discussed the use of AI scribe software for clinical note transcription with the [name], who gave verbal consent to proceed.  History of Present Illness Amber [name] is a 63 year old female who presents with symptoms of a viral illness.  She has experienced nasal congestion and rhinorrhea since Saturday after exposure to a coworker improperly wearing a mask. Her fever reached a maximum of 99.99F. Over-the-counter medications, including Clostridium HD, have provided some relief, but chest tightness and a scratchy throat persist. Significant coughing has disrupted her sleep, and she did not sleep last night. This is day three of her symptoms, and she feels congested in her upper airway. She has previously used Tessalon  Perles for cough relief and finds them helpful. She is currently on her days off from work.      Health Maintenance Due  Topic Date Due   FOOT EXAM  03/07/2023   COVID-19 Vaccine (6 - Moderna risk 2024-25 season) 04/02/2023    Past Medical History:  Diagnosis Date   Anemia    Arthritis    Asthma 05/03/2020   Back pain    Blood transfusion without reported diagnosis    CAD (coronary artery disease) 04/24/2017   Cataract    Cerebrovascular disease 10/13/2020   Chronic diastolic CHF (congestive heart failure) (HCC) 04/24/2017   Colon polyps    Controlled type 2 diabetes mellitus without complication, without long-term current use of insulin  (HCC) 05/31/2017   Diabetic peripheral neuropathy (HCC) 01/05/2020   Ectopic pregnancy    RIGHT salpingostomy   Elective abortion    Elective abortion 09/26/2020   RIGHT salpingostomy   Essential hypertension 09/04/2017   GERD  (gastroesophageal reflux disease)    Glaucoma    both eyes   Headache    migraines   History of chicken pox    History of chicken pox 09/26/2020   migraines   Hyperlipemia    Hypertension    Iron  deficiency anemia 09/02/2013   Joint pain    Migraine 04/19/2014   Obesity (BMI 30-39.9) 10/17/2012   Palpitations 10/01/2012   Pre-diabetes    Rheumatoid arthritis (HCC) 11/07/2011   Rheumatoid arthritis(714.0) 11/07/2011   SAB (spontaneous abortion)    SAB (spontaneous abortion) 11/07/2011   Shortness of breath    Sjogren's disease (HCC)    Swelling of lower extremity    Vitamin B12 deficiency    Wheezing     Past Surgical History:  Procedure Laterality Date   ABDOMINAL SURGERY     bowel repair   CATARACT EXTRACTION     02/25/2023, 03/11/2023   COLONOSCOPY     COLONOSCOPY  08/2022   DILATION AND CURETTAGE OF UTERUS     X 2   KNEE ARTHROSCOPY W/ MENISCAL REPAIR Right 01/16/2023   MENISCUS REPAIR Right    MENISCUS TEAR REPAIR   MYOMECTOMY N/A 07/27/2014   Procedure: ABDOMINAL MYOMECTOMY;  Surgeon: Vernal Gold, MD;  Location: [facility];  Service: Gynecology;  Laterality: N/A;   PELVIC LAPAROSCOPY  1994   IN 1999 LAPAROSCOPY WITH INCIDENTAL ENTEROTOMY REQUIRING LAPAROTOMY   ROTATOR CUFF REPAIR Left 06/21/2021   UTERINE FIBROID SURGERY     July  05/19/14    Family History  Problem Relation Age of Onset   Hypertension Mother        died from heart disease   Heart disease Mother        deceased, unknown cause   Stroke Mother    Obesity Mother    High Cholesterol Mother    Sudden death Father    Diabetes Sister    Hypertension Sister    Esophageal cancer Sister    Stomach cancer Sister    Cancer Sister    Diabetes Sister    Parkinson's disease Brother    Rectal cancer Neg Hx    Colon cancer Neg Hx    Colon polyps Neg Hx     Social History   Socioeconomic History   Marital status: Single    Spouse name: Not on file   Number of children: 0   Years of education:  college   Highest education level: Associate degree: occupational, Scientist, product/process development, or vocational program  Occupational History   Occupation: LPN    Employer: [employer]  Tobacco Use   Smoking status: Never    Passive exposure: Never   Smokeless tobacco: Never  Vaping Use   Vaping status: Never Used  Substance and Sexual Activity   Alcohol use: No    Comment: occasional   Drug use: No   Sexual activity: Yes    Birth control/protection: None    Comment: 1st intercourse- 17, partners- 5  Other Topics Concern   Not on file  Social History Narrative   Regular exercise:  No   Caffeine Use: 1 cup coffee daily   No children   "Happily divorced"   Reports that she is involved at her church   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  Had a sister up in [place]  who passed away 2015/05/19 who she was close to.   Born in [place]- she came to [place]  as adult.  Age 73.    Left-handed.         Social Drivers of Health   Financial Resource Strain: Medium Risk (01/25/2023)   Overall Financial Resource Strain (CARDIA)    Difficulty of Paying Living Expenses: Somewhat hard  Food Insecurity: No Food Insecurity (01/25/2023)   Hunger Vital Sign    Worried About Running Out of Food in the Last Year: Never true    Ran Out of Food in the Last Year: Never true  Transportation Needs: No Transportation Needs (01/25/2023)   PRAPARE - Administrator, Civil Service (Medical): No    Lack of Transportation (Non-Medical): No  Physical Activity: Inactive (01/25/2023)   Exercise Vital Sign    Days of Exercise per Week: 0 days    Minutes of Exercise per Session: 20 min  Stress: No Stress Concern Present (01/25/2023)   [employer] of Occupational Health - Occupational Stress Questionnaire    Feeling of Stress : Not at all  Social Connections: Moderately Integrated (01/25/2023)   Social Connection and Isolation Panel [NHANES]    Frequency of Communication with Friends and Family: More than  three times a week    Frequency of Social Gatherings with Friends and Family: Once a week    Attends Religious Services: More than 4 times per year    Active Member of [employer] or Organizations: Yes    Attends Banker Meetings: More than 4 times per year    Marital Status: Divorced  Intimate Partner Violence: Unknown (04/04/2021)  [facility]   HITS    Physically Hurt: Not on file    Insult or Talk Down To: Not on file    Threaten Physical Harm: Not on file    Scream or Curse: Not on file    Outpatient Medications Prior to Visit  Medication Sig Dispense Refill   Abatacept  (ORENCIA  CLICKJECT) 125 MG/ML SOAJ Inject 125 mg into the skin every 7 (seven) days. 4 mL 1   albuterol  (VENTOLIN  HFA) 108 (90 Base) MCG/ACT inhaler Inhale 2 puffs into the lungs every 6 (six) hours as needed. 18 g 0   aspirin  EC 81 MG tablet Take 1 tablet (81 mg total) by mouth daily. 90 tablet 3   brimonidine-timolol (COMBIGAN) 0.2-0.5 % ophthalmic solution      calcium -vitamin D  (OSCAL WITH D) 500-200 MG-UNIT TABS tablet Take by mouth.     carvedilol  (COREG ) 25 MG tablet Take 1 tablet (25 mg total) by mouth 2 (two) times daily with a meal. 60 tablet 5   cyanocobalamin  (VITAMIN B12) 1000 MCG/ML injection 1000mcg IM weekly x 4 weeks then once monthly 12 mL 0   diltiazem  (CARTIA  XT) 240 MG 24 hr capsule Take 1 capsule (240 mg total) by mouth daily. 90 capsule 1   fluticasone  (FLONASE ) 50 MCG/ACT nasal spray Place 2 sprays into both nostrils daily. ([name] taking differently: Place 2 sprays into both nostrils as needed.) 16 g 1   Fremanezumab -vfrm (AJOVY ) 225 MG/1.5ML SOAJ Inject 225 mg into the skin every 30 (thirty) days. 1.68 mL 11   ibuprofen  (ADVIL ) 200 MG tablet Take 200 mg by mouth every 6 (six) hours as needed.     Iron , Ferrous Sulfate , 325 (65 Fe) MG TABS Take 325 mg by mouth 2 (two) times daily. 30 tablet    rosuvastatin  (CRESTOR ) 10 MG tablet Take 1 tablet by mouth  once daily 30 tablet 0   SUMAtriptan  (IMITREX ) 100 MG tablet Take 1 tablet (100 mg total) by mouth as needed for migraine. May repeat in 2 hours if headache persists or recurs. 30 tablet 5   Syringe/Needle, Disp, (SYRINGE 3CC/22GX1-1/2") 22G X 1-1/2" 3 ML MISC Use as directed 50 each 0   timolol (TIMOPTIC) 0.5 % ophthalmic solution 1 drop every morning.     tirzepatide (MOUNJARO) 2.5 MG/0.5ML Pen Inject 2.5 mg into the skin once a week. 2 mL 0   TRAVATAN Z 0.004 % SOLN ophthalmic solution Place 1 drop into both eyes at bedtime.      triamterene -hydrochlorothiazide  (MAXZIDE-25) 37.5-25 MG tablet Take 1 tablet by mouth daily. 90 tablet 1   valsartan  (DIOVAN ) 320 MG tablet Take 1 tablet (320 mg total) by mouth daily. 90 tablet 1   Vitamin D , Ergocalciferol , (DRISDOL ) 1.25 MG (50000 UNIT) CAPS capsule Take 1 capsule (50,000 Units total) by mouth every 7 (seven) days. 5 capsule 0   No facility-administered medications prior to visit.    Allergies  Allergen Reactions   Gabapentin      Made her "crazy"    Review of Systems  Constitutional:  Positive for fever.  Respiratory:  Positive for cough.        Objective:    Physical Exam Constitutional:      General: She is not in acute distress.    Appearance: Normal appearance. She is well-developed.  HENT:     Head: Normocephalic and atraumatic.     Right Ear: Tympanic membrane, ear canal and external ear normal.     Left Ear:  Tympanic membrane, ear canal and external ear normal.     Mouth/Throat:     Pharynx: Posterior oropharyngeal erythema (mild) present. No oropharyngeal exudate or uvula swelling.  Eyes:     General: No scleral icterus. Neck:     Thyroid : No thyromegaly.  Cardiovascular:     Rate and Rhythm: Normal rate and regular rhythm.     Heart sounds: Normal heart sounds. No murmur heard. Pulmonary:     Effort: Pulmonary effort is normal. No respiratory distress.     Breath sounds: Normal breath sounds. No wheezing.   Musculoskeletal:     Cervical back: Neck supple.  Skin:    General: Skin is warm and dry.  Neurological:     Mental Status: She is alert and oriented to person, place, and time.  Psychiatric:        Mood and Affect: Mood normal.        Behavior: Behavior normal.        Thought Content: Thought content normal.        Judgment: Judgment normal.      BP 121/64 (BP Location: Right Arm, [name] Position: Sitting, Cuff Size: Large)   Pulse 68   Temp 98.8 F (37.1 C) (Oral)   Resp 12   Ht 5' 5.5" (1.664 m)   Wt 208 lb 12.8 oz (94.7 kg)   LMP 08/09/2016   SpO2 99%   BMI 34.22 kg/m  Wt Readings from Last 3 Encounters:  05/07/23 208 lb 12.8 oz (94.7 kg)  04/26/23 205 lb (93 kg)  04/24/23 203 lb (92.1 kg)       Assessment & Plan:   Problem List Items Addressed This Visit       Unprioritized   Viral URI with cough - Primary   Acute viral upper respiratory infection with congestion, cough, sore throat, and rhinorrhea. Fever at 99.14F.  - COVID-19 swab test is negative - Prescribe Tessalon  Perles for cough. - Recommend Mucinex  for congestion. - Advise to report if symptoms do not improve in 3-4 days or if she worsens.      Relevant Orders   POC COVID-19 BinaxNow (Completed)    I am having Ninah D. Scholler maintain her Travatan Z, calcium -vitamin D , aspirin  EC, Iron  (Ferrous Sulfate ), fluticasone , albuterol , SUMAtriptan , diltiazem , ibuprofen , timolol, triamterene -hydrochlorothiazide , carvedilol , Orencia  ClickJect, rosuvastatin , brimonidine-timolol, Ajovy , tirzepatide, Vitamin D  (Ergocalciferol ), cyanocobalamin , SYRINGE 3CC/22GX1-1/2", and valsartan .  No orders of the defined types were placed in this encounter.

## 2023-05-08 ENCOUNTER — Encounter: Payer: Self-pay | Admitting: Family

## 2023-05-08 MED ORDER — BENZONATATE 100 MG PO CAPS: 100.0000 mg | ORAL_CAPSULE | Freq: Three times a day (TID) | ORAL | 0 refills | Status: DC

## 2023-05-08 NOTE — Patient Instructions (Signed)
 VISIT SUMMARY:  You came in today with symptoms of a viral illness, including nasal congestion, runny nose, chest tightness, scratchy throat, and significant coughing that has disrupted your sleep. Your fever reached a maximum of 99.54F.  YOUR PLAN:  VIRAL UPPER RESPIRATORY INFECTION: You have an acute viral upper respiratory infection with symptoms like congestion, cough, sore throat, and runny nose. Your fever reached 99.54F.  -You tested negative for Covid-19. -I am prescribing Tessalon  Perles to help relieve your cough. -You can take Mucinex  to help with your congestion. -Please report back if your symptoms do not improve in 3-4 days or if they worsen.

## 2023-05-08 NOTE — Addendum Note (Signed)
 Addended by: Joye Nobles on: 05/08/2023 02:00 PM   Modules accepted: Orders

## 2023-05-08 NOTE — Assessment & Plan Note (Signed)
 Acute viral upper respiratory infection with congestion, cough, sore throat, and rhinorrhea. Fever at 99.89F.  - COVID-19 swab test is negative - Prescribe Tessalon  Perles for cough. - Recommend Mucinex  for congestion. - Advise to report if symptoms do not improve in 3-4 days or if she worsens.

## 2023-05-22 NOTE — Telephone Encounter (Unsigned)
 Copied from CRM [id number]. Topic: Clinical - Medication Refill >> May 22, 2023 10:29 AM Adonis Hoot wrote: Medication: rosuvastatin  (CRESTOR ) 10 MG tablet  Has the patient contacted their pharmacy? Yes (Agent: If no, request that the patient contact the pharmacy for the refill. If patient does not wish to contact the pharmacy document the reason why and proceed with request.) (Agent: If yes, when and what did the pharmacy advise?)  This is the patient's preferred pharmacy:  [facility] [address], [place] - [address] [address] Phone: [phone] Fax: [phone]   Is this the correct pharmacy for this prescription? Yes If no, delete pharmacy and type the correct one.   Has the prescription been filled recently? Yes  Is the patient out of the medication? Yes  Has the patient been seen for an appointment in the last year OR does the patient have an upcoming appointment? Yes  Can we respond through MyChart? Yes  Agent: Please be advised that Rx refills may take up to 3 business days. We ask that you follow-up with your pharmacy.

## 2023-05-25 ENCOUNTER — Other Ambulatory Visit: Payer: Self-pay | Admitting: Bariatrics

## 2023-05-29 ENCOUNTER — Ambulatory Visit (INDEPENDENT_AMBULATORY_CARE_PROVIDER_SITE_OTHER): Admitting: Adult Health

## 2023-05-29 ENCOUNTER — Other Ambulatory Visit: Payer: Self-pay

## 2023-05-29 ENCOUNTER — Encounter (INDEPENDENT_AMBULATORY_CARE_PROVIDER_SITE_OTHER): Payer: Self-pay | Admitting: Adult Health

## 2023-05-29 VITALS — BP 117/67 | HR 66 | Temp 98.2°F | Ht 60.0 in | Wt 199.0 lb

## 2023-05-29 DIAGNOSIS — E538 Deficiency of other specified B group vitamins: Secondary | ICD-10-CM

## 2023-05-29 DIAGNOSIS — I10 Essential (primary) hypertension: Secondary | ICD-10-CM | POA: Diagnosis not present

## 2023-05-29 DIAGNOSIS — E669 Obesity, unspecified: Secondary | ICD-10-CM

## 2023-05-29 DIAGNOSIS — Z7985 Long-term (current) use of injectable non-insulin antidiabetic drugs: Secondary | ICD-10-CM

## 2023-05-29 DIAGNOSIS — E119 Type 2 diabetes mellitus without complications: Secondary | ICD-10-CM

## 2023-05-29 DIAGNOSIS — Z6833 Body mass index (BMI) 33.0-33.9, adult: Secondary | ICD-10-CM

## 2023-05-29 MED ORDER — ROSUVASTATIN CALCIUM 10 MG PO TABS: 10.0000 mg | ORAL_TABLET | Freq: Every day | ORAL | 1 refills | Status: DC

## 2023-05-29 MED ORDER — BLOOD GLUCOSE MONITORING SUPPL DEVI: 1.0000 | Freq: Three times a day (TID) | 0 refills | Status: DC

## 2023-05-29 MED ORDER — ONETOUCH ULTRA BLUE TEST VI STRP: ORAL_STRIP | 0 refills | Status: AC

## 2023-05-29 MED ORDER — LANCETS MISC. MISC: 1.0000 | Freq: Three times a day (TID) | 0 refills | Status: AC

## 2023-05-29 MED ORDER — VITAMIN D (ERGOCALCIFEROL) 1.25 MG (50000 UNIT) PO CAPS: 50000.0000 [IU] | ORAL_CAPSULE | ORAL | 0 refills | Status: DC

## 2023-05-29 MED ORDER — LANCET DEVICE MISC: 1.0000 | Freq: Three times a day (TID) | 0 refills | Status: AC

## 2023-05-29 MED ORDER — TIRZEPATIDE 5 MG/0.5ML ~~LOC~~ SOAJ: 5.0000 mg | SUBCUTANEOUS | 0 refills | Status: DC

## 2023-05-29 NOTE — Progress Notes (Signed)
 WEIGHT SUMMARY AND BIOMETRICS  Vitals Temp: 98.2 F (36.8 C) BP: 117/67 Pulse Rate: 66 SpO2: 99 %   Anthropometric Measurements Height: 5\' 5"  (1.651 m) Weight: 199 lb (90.3 kg) BMI (Calculated): 33.12 Weight at Last Visit: 203 lb Weight Lost Since Last Visit: 4 lb Weight Gained Since Last Visit: 0 Starting Weight: 214 lb Total Weight Loss (lbs): 15 lb (6.804 kg) Peak Weight: 218 lb   Body Composition  Body Fat %: 43.4 % Fat Mass (lbs): 86.4 lbs Muscle Mass (lbs): 106.8 lbs Total Body Water (lbs): 74 lbs Visceral Fat Rating : 12   Other Clinical Data Fasting: no Labs: no Today's Visit #: 5 Starting Date: 12/05/22 Comments: recheck ht today,5'5 correct    Chief Complaint:   OBESITY Amber [name] is here to discuss her progress with her obesity treatment plan.  She is on the the Category 2 Plan and states she is following her eating plan approximately 60 % of the time.  She states she is exercising Physical Therapy 45 minutes 2 times per week.  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 Essential hypertension BP excellent and at goal She denies CP with exertion  2. Controlled type 2 diabetes mellitus without complication, without long-term current use of insulin  [facility]) Lab Results  Component Value Date   HGBA1C 6.4 04/26/2023   HGBA1C 6.4 01/25/2023   HGBA1C 6.7 (H) 10/17/2022    04/24/2023 she started on loading dose Mounjaro  2.5mg  She has had 4 doses She has not been checking home CBG She denies sx's of hypoglycemia Denies mass in neck, dysphagia,  dyspepsia, persistent hoarseness, abdominal pain, or N/V/C   3. B12 deficiency   Latest Reference Range & Units 10/17/22 08:25 12/05/22 09:42  Vitamin B12 232 - 1245 pg/mL >1501 (H) >2000 (H)  (H): Data is abnormally high  PCP provides her monthly B12 1000mcg injection She reports persistent exhaustion r/t to night shift work.  Assessment/Plan:   1. Essential hypertension (Primary) Limit Na+ Continue Cat 2 MP Remain as active as possible  2. Controlled type 2 diabetes mellitus without complication, without long-term current use of insulin  (HCC) Refill and INCREASE  tirzepatide  (MOUNJARO ) 5 MG/0.5ML Pen Inject 5 mg into the skin once a week. Dispense: 6 mL, Refills: 0 ordered   3. B12 deficiency Request B12 injection refill from PCP  5. Obesity (BMI 30-39.9), CURRENT BMI 33.1  Amber [name] is currently in the action stage of change. As such, her goal is to continue with weight loss efforts. She has agreed to the Category 2 Plan.   Exercise goals: All adults should avoid inactivity. Some physical activity is better than none, and adults who participate in any amount of physical activity gain some health benefits. Adults should also include muscle-strengthening activities that involve all major muscle groups on 2 or more days a week.  Behavioral modification strategies: increasing lean protein intake, decreasing simple carbohydrates, increasing vegetables, increasing water intake, no skipping meals, meal planning and cooking strategies, keeping healthy foods in the home, ways to avoid  boredom eating, and planning for success.  Amber [name] has agreed to follow-up with our clinic in 4 weeks. She was informed of the importance of frequent follow-up visits to maximize her success with intensive lifestyle modifications for her multiple health conditions.   Objective:   Blood pressure 117/67, pulse 66, temperature 98.2 F (36.8 C), height 5\' 5"  (1.651 m), weight 199 lb (90.3 kg), last  menstrual period 08/09/2016, SpO2 99%. Body mass index is 33.12 kg/m.  General: Cooperative, alert, well developed, in no acute distress. HEENT: Conjunctivae and lids unremarkable. Cardiovascular: Regular rhythm.  Lungs: Normal work of breathing. Neurologic: No focal deficits.   Lab Results  Component Value Date   CREATININE 0.72 04/26/2023   BUN 12 04/26/2023   NA 138 04/26/2023   K 4.1 04/26/2023   CL 102 04/26/2023   CO2 29 04/26/2023   Lab Results  Component Value Date   ALT 16 04/26/2023   AST 15 04/26/2023   ALKPHOS 56 04/26/2023   BILITOT 0.7 04/26/2023   Lab Results  Component Value Date   HGBA1C 6.4 04/26/2023   HGBA1C 6.4 01/25/2023   HGBA1C 6.7 (H) 10/17/2022   HGBA1C 6.7 (H) 07/17/2022   HGBA1C 6.6 (H) 03/07/2022   Lab Results  Component Value Date   INSULIN  17.5 12/05/2022   INSULIN  16.5 09/04/2017   Lab Results  Component Value Date   TSH 1.560 12/05/2022   Lab Results  Component Value Date   CHOL 120 12/05/2022   HDL 54 12/05/2022   LDLCALC 49 12/05/2022   TRIG 85 12/05/2022   CHOLHDL 3 07/17/2022   Lab Results  Component Value Date   VD25OH 29.4 (L) 12/05/2022   VD25OH 37.2 09/04/2017   Lab Results  Component Value Date   WBC 4.0 03/25/2023   HGB 12.3 03/25/2023   HCT 38.1 03/25/2023   MCV 84.5 03/25/2023   PLT 221 03/25/2023   Lab Results  Component Value Date   IRON  92 12/05/2022   TIBC 303 12/05/2022   FERRITIN 120 12/05/2022   Attestation Statements:   Reviewed by clinician on day of visit: allergies, medications, problem list, medical history, surgical history, family history, social history, and previous encounter notes.  I have reviewed the above documentation for accuracy and completeness, and I agree with the above. -  Raiana Pharris d. Corbyn Steedman, NP-C

## 2023-06-11 NOTE — Progress Notes (Deleted)
 Office Visit Note  Patient: Amber [name]             Date of Birth: 1960-06-08           MRN: [id number]             PCP: Dorrene Gaucher, NP Referring: Dorrene Gaucher, NP Visit Date: 06/25/2023 Occupation: @GUAROCC @  Subjective:  No chief complaint on file.   History of Present Illness: Amber [name] is a 63 y.o. female ***     Activities of Daily Living:  Patient reports morning stiffness for *** {minute/hour:19697}.   Patient {ACTIONS;DENIES/REPORTS:21021675::"Denies"} nocturnal pain.  Difficulty dressing/grooming: {ACTIONS;DENIES/REPORTS:21021675::"Denies"} Difficulty climbing stairs: {ACTIONS;DENIES/REPORTS:21021675::"Denies"} Difficulty getting out of chair: {ACTIONS;DENIES/REPORTS:21021675::"Denies"} Difficulty using hands for taps, buttons, cutlery, and/or writing: {ACTIONS;DENIES/REPORTS:21021675::"Denies"}  No Rheumatology ROS completed.   PMFS History:  Patient Active Problem List   Diagnosis Date Noted   OSA (obstructive sleep apnea) 01/25/2023   Vitamin D  insufficiency 12/10/2022   Preventative health care 07/17/2022   Peripheral neuropathy 07/17/2022   Viral URI with cough 03/07/2022   High risk HPV infection 10/17/2020   Cerebrovascular disease 10/13/2020   Colon polyps 09/26/2020   GERD (gastroesophageal reflux disease) 09/26/2020   B12 deficiency 05/03/2020   Hyperlipidemia 05/03/2020   Asthma 05/03/2020   Diabetic peripheral neuropathy (HCC) 01/05/2020   Class 2 severe obesity with serious comorbidity and body mass index (BMI) of 38.0 to 38.9 in adult, unspecified obesity type (HCC) 01/05/2020   Adrenal mass, right (HCC) 01/05/2020   Hypertensive heart disease 09/04/2017   Controlled type 2 diabetes mellitus without complication, without long-term current use of insulin  (HCC) 05/31/2017   Chronic diastolic CHF (congestive heart failure) (HCC) 04/24/2017   CAD (coronary artery disease) 04/24/2017   History of BCG vaccination  06/14/2016   Fibroids 07/27/2014   Migraine 04/19/2014   Elevated serum protein level 04/09/2014   Encounter for counseling regarding immunization 04/09/2014   Iron  deficiency anemia 09/02/2013   Obesity (BMI 30-39.9) 10/17/2012   Glaucoma 11/07/2011   Rheumatoid arthritis (HCC) 11/07/2011   Essential hypertension 11/05/2011   Sjogren's disease (HCC) 11/05/2011   Anemia 09/22/2010   Fibroid uterus 09/22/2010    Past Medical History:  Diagnosis Date   Anemia    Arthritis    Asthma 05/03/2020   Back pain    Blood transfusion without reported diagnosis    CAD (coronary artery disease) 04/24/2017   Cataract    Cerebrovascular disease 10/13/2020   Chronic diastolic CHF (congestive heart failure) (HCC) 04/24/2017   Colon polyps    Controlled type 2 diabetes mellitus without complication, without long-term current use of insulin  (HCC) 05/31/2017   Diabetic peripheral neuropathy (HCC) 01/05/2020   Ectopic pregnancy    RIGHT salpingostomy   Elective abortion    Elective abortion 09/26/2020   RIGHT salpingostomy   Essential hypertension 09/04/2017   GERD (gastroesophageal reflux disease)    Glaucoma    both eyes   Headache    migraines   History of chicken pox    History of chicken pox 09/26/2020   migraines   Hyperlipemia    Hypertension    Iron  deficiency anemia 09/02/2013   Joint pain    Migraine 04/19/2014   Obesity (BMI 30-39.9) 10/17/2012   Palpitations 10/01/2012   Pre-diabetes    Rheumatoid arthritis (HCC) 11/07/2011   Rheumatoid arthritis(714.0) 11/07/2011   SAB (spontaneous abortion)    SAB (spontaneous abortion) 11/07/2011   Shortness of breath    Sjogren's disease (HCC)  Swelling of lower extremity    Vitamin B12 deficiency    Wheezing     Family History  Problem Relation Age of Onset   Hypertension Mother        died from heart disease   Heart disease Mother        deceased, unknown cause   Stroke Mother    Obesity Mother    High Cholesterol  Mother    Sudden death Father    Diabetes Sister    Hypertension Sister    Esophageal cancer Sister    Stomach cancer Sister    Cancer Sister    Diabetes Sister    Parkinson's disease Brother    Rectal cancer Neg Hx    Colon cancer Neg Hx    Colon polyps Neg Hx    Past Surgical History:  Procedure Laterality Date   ABDOMINAL SURGERY     bowel repair   CATARACT EXTRACTION     02/25/2023, 03/11/2023   COLONOSCOPY     COLONOSCOPY  08/2022   DILATION AND CURETTAGE OF UTERUS     X 2   KNEE ARTHROSCOPY W/ MENISCAL REPAIR Right 01/16/2023   MENISCUS REPAIR Right    MENISCUS TEAR REPAIR   MYOMECTOMY N/A 07/27/2014   Procedure: ABDOMINAL MYOMECTOMY;  Surgeon: Vernal Gold, MD;  Location: [facility];  Service: Gynecology;  Laterality: N/A;   PELVIC LAPAROSCOPY  1994   IN 1999 LAPAROSCOPY WITH INCIDENTAL ENTEROTOMY REQUIRING LAPAROTOMY   ROTATOR CUFF REPAIR Left 06/21/2021   UTERINE FIBROID SURGERY     July 2016   Social History   Social History Narrative   Regular exercise:  No   Caffeine Use: 1 cup coffee daily   No children   "Happily divorced"   Reports that she is involved at her church   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  Had a sister up in [place]  who passed away 24-Jun-2015 who she was close to.   Born in [place]- she came [place].  Age 64.    Left-handed.         Immunization History  Administered Date(s) Administered   Hepatitis A, Adult 11/01/2017, 12/03/2018   IPV 11/01/2017, 05/01/2022   Influenza Split 11/07/2011   Influenza, Seasonal, Injecte, Preservative Fre 09/24/2022   Influenza,inj,Quad PF,6+ Mos 10/17/2012, 01/15/2014, 08/31/2014, 09/30/2017, 09/10/2018, 09/21/2019, 10/07/2020, 11/07/2021   Meningococcal Mcv4o 11/01/2017   Moderna Sars-Covid-2 Vaccination 01/28/2019, 02/27/2019, 11/27/2019   Pfizer(Comirnaty)Fall Seasonal Vaccine 12 years and older 10/03/2020, 10/02/2022   Pneumococcal Conjugate-13 08/05/2020   Pneumococcal  Polysaccharide-23 09/10/2018   Td 07/17/2022   Tdap 06/01/2011, 11/25/2012   Typhoid Live 11/01/2017   Zoster Recombinant(Shingrix ) 10/04/2017, 12/04/2017     Objective: Vital Signs: LMP 08/09/2016    Physical Exam   Musculoskeletal Exam: ***  CDAI Exam: CDAI Score: -- Patient Global: --; Provider Global: -- Swollen: --; Tender: -- Joint Exam 06/25/2023   No joint exam has been documented for this visit   There is currently no information documented on the homunculus. Go to the Rheumatology activity and complete the homunculus joint exam.  Investigation: No additional findings.  Imaging: No results found.  Recent Labs: Lab Results  Component Value Date   WBC 4.0 03/25/2023   HGB 12.3 03/25/2023   PLT 221 03/25/2023   NA 138 04/26/2023   K 4.1 04/26/2023   CL 102 04/26/2023   CO2 29 04/26/2023   GLUCOSE 65 (L) 04/26/2023   BUN 12 04/26/2023  CREATININE 0.72 04/26/2023   BILITOT 0.7 04/26/2023   ALKPHOS 56 04/26/2023   AST 15 04/26/2023   ALT 16 04/26/2023   PROT 7.9 04/26/2023   ALBUMIN 4.1 04/26/2023   CALCIUM  9.5 04/26/2023   GFRAA 115 02/19/2020   QFTBGOLD Negative 06/14/2016   QFTBGOLDPLUS NEGATIVE 03/25/2023    Speciality Comments: PLQ eye exam: 12/09/2018 normal. [facility].  Procedures:  No procedures performed Allergies: Gabapentin    Assessment / Plan:     Visit Diagnoses: Rheumatoid arthritis involving multiple sites with positive rheumatoid factor (HCC)  High risk medication use  Sjogren's syndrome with other organ involvement (HCC)  S/P rotator cuff repair left  Primary osteoarthritis of both knees  S/P arthroscopic knee surgery  Primary osteoarthritis of both feet  Plantar fasciitis, bilateral  Neck pain  Family history of rheumatoid arthritis  Coronary artery disease involving native coronary artery of native heart without angina pectoris  Essential hypertension  Palpitations  Chronic diastolic CHF (congestive  heart failure) (HCC)  Controlled type 2 diabetes mellitus without complication, without long-term current use of insulin  (HCC)  Hx of migraines  Orders: No orders of the defined types were placed in this encounter.  No orders of the defined types were placed in this encounter.   Face-to-face time spent with patient was *** minutes. Greater than 50% of time was spent in counseling and coordination of care.  Follow-Up Instructions: No follow-ups on file.   Romayne Clubs, PA-C  Note - This record has been created using Dragon software.  Chart creation errors have been sought, but may not always  have been located. Such creation errors do not reflect on  the standard of medical care.

## 2023-06-20 ENCOUNTER — Other Ambulatory Visit: Payer: Self-pay | Admitting: Family

## 2023-06-21 NOTE — Progress Notes (Unsigned)
 Office Visit Note  Patient: Amber [name]             Date of Birth: [birth year]           MRN: [id number]             PCP: Dorrene Gaucher, NP Referring: Dorrene Gaucher, NP Visit Date: 06/24/2023 Occupation: @GUAROCC @  Subjective:    History of Present Illness: Amber [name] is a 63 y.o. female with history of seropositive rheumatoid arthritis.  Patient remains on Orencia  125 mg subcutaneous every 10 days   CBC and CMP updated on  03/25/23. CMET updated on 04/26/23.  TB gold negative on 03/25/23 Lipid panel updated on 12/05/22. Discussed the importance of holding orencia  if she develops signs or symptoms of an infection and to resume once the infection has completely cleared.     Activities of Daily Living:  Patient reports morning stiffness for *** {minute/hour:19697}.   Patient {ACTIONS;DENIES/REPORTS:21021675::Denies} nocturnal pain.  Difficulty dressing/grooming: {ACTIONS;DENIES/REPORTS:21021675::Denies} Difficulty climbing stairs: {ACTIONS;DENIES/REPORTS:21021675::Denies} Difficulty getting out of chair: {ACTIONS;DENIES/REPORTS:21021675::Denies} Difficulty using hands for taps, buttons, cutlery, and/or writing: {ACTIONS;DENIES/REPORTS:21021675::Denies}  No Rheumatology ROS completed.   PMFS History:  Patient Active Problem List   Diagnosis Date Noted   OSA (obstructive sleep apnea) 01/25/2023   Vitamin D  insufficiency 12/10/2022   Preventative health care 07/17/2022   Peripheral neuropathy 07/17/2022   Viral URI with cough 03/07/2022   High risk HPV infection 10/17/2020   Cerebrovascular disease 10/13/2020   Colon polyps 09/26/2020   GERD (gastroesophageal reflux disease) 09/26/2020   B12 deficiency 05/03/2020   Hyperlipidemia 05/03/2020   Asthma 05/03/2020   Diabetic peripheral neuropathy (HCC) 01/05/2020   Class 2 severe obesity with serious comorbidity and body mass index (BMI) of 38.0 to 38.9 in adult, unspecified obesity type  (HCC) 01/05/2020   Adrenal mass, right (HCC) 01/05/2020   Hypertensive heart disease 09/04/2017   Controlled type 2 diabetes mellitus without complication, without long-term current use of insulin  (HCC) 05/31/2017   Chronic diastolic CHF (congestive heart failure) (HCC) 04/24/2017   CAD (coronary artery disease) 04/24/2017   History of BCG vaccination 06/14/2016   Fibroids 07/27/2014   Migraine 04/19/2014   Elevated serum protein level 04/09/2014   Encounter for counseling regarding immunization 04/09/2014   Iron  deficiency anemia 09/02/2013   Obesity (BMI 30-39.9) 10/17/2012   Glaucoma 11/07/2011   Rheumatoid arthritis (HCC) 11/07/2011   Essential hypertension 11/05/2011   Sjogren's disease (HCC) 11/05/2011   Anemia 09/22/2010   Fibroid uterus 09/22/2010    Past Medical History:  Diagnosis Date   Anemia    Arthritis    Asthma 05/03/2020   Back pain    Blood transfusion without reported diagnosis    CAD (coronary artery disease) 04/24/2017   Cataract    Cerebrovascular disease 10/13/2020   Chronic diastolic CHF (congestive heart failure) (HCC) 04/24/2017   Colon polyps    Controlled type 2 diabetes mellitus without complication, without long-term current use of insulin  (HCC) 05/31/2017   Diabetic peripheral neuropathy (HCC) 01/05/2020   Ectopic pregnancy    RIGHT salpingostomy   Elective abortion    Elective abortion 09/26/2020   RIGHT salpingostomy   Essential hypertension 09/04/2017   GERD (gastroesophageal reflux disease)    Glaucoma    both eyes   Headache    migraines   History of chicken pox    History of chicken pox 09/26/2020   migraines   Hyperlipemia    Hypertension    Iron  deficiency anemia 09/02/2013  Joint pain    Migraine 04/19/2014   Obesity (BMI 30-39.9) 10/17/2012   Palpitations 10/01/2012   Pre-diabetes    Rheumatoid arthritis (HCC) 11/07/2011   Rheumatoid arthritis(714.0) 11/07/2011   SAB (spontaneous abortion)    SAB (spontaneous  abortion) 11/07/2011   Shortness of breath    Sjogren's disease (HCC)    Swelling of lower extremity    Vitamin B12 deficiency    Wheezing     Family History  Problem Relation Age of Onset   Hypertension Mother        died from heart disease   Heart disease Mother        deceased, unknown cause   Stroke Mother    Obesity Mother    High Cholesterol Mother    Sudden death Father    Diabetes Sister    Hypertension Sister    Esophageal cancer Sister    Stomach cancer Sister    Cancer Sister    Diabetes Sister    Parkinson's disease Brother    Rectal cancer Neg Hx    Colon cancer Neg Hx    Colon polyps Neg Hx    Past Surgical History:  Procedure Laterality Date   ABDOMINAL SURGERY     bowel repair   CATARACT EXTRACTION     02/25/2023, 03/11/2023   COLONOSCOPY     COLONOSCOPY  08/2022   DILATION AND CURETTAGE OF UTERUS     X 2   KNEE ARTHROSCOPY W/ MENISCAL REPAIR Right 01/16/2023   MENISCUS REPAIR Right    MENISCUS TEAR REPAIR   MYOMECTOMY N/A 07/27/2014   Procedure: ABDOMINAL MYOMECTOMY;  Surgeon: Vernal Gold, MD;  Location: [facility];  Service: Gynecology;  Laterality: N/A;   PELVIC LAPAROSCOPY  1994   IN 1999 LAPAROSCOPY WITH INCIDENTAL ENTEROTOMY REQUIRING LAPAROTOMY   ROTATOR CUFF REPAIR Left 06/21/2021   UTERINE FIBROID SURGERY     July 2016   Social History   Social History Narrative   Regular exercise:  No   Caffeine Use: 1 cup coffee daily   No children   Happily divorced   Reports that she is involved at her church   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  Had a sister up in [place]  who passed away 07/13/15 who she was close to.   Born in [place]- she [place].  Age 23.    Left-handed.         Immunization History  Administered Date(s) Administered   Hepatitis A, Adult 11/01/2017, 12/03/2018   IPV 11/01/2017, 05/01/2022   Influenza Split 11/07/2011   Influenza, Seasonal, Injecte, Preservative Fre 09/24/2022    Influenza,inj,Quad PF,6+ Mos 10/17/2012, 01/15/2014, 08/31/2014, 09/30/2017, 09/10/2018, 09/21/2019, 10/07/2020, 11/07/2021   Meningococcal Mcv4o 11/01/2017   Moderna Sars-Covid-2 Vaccination 01/28/2019, 02/27/2019, 11/27/2019   Pfizer(Comirnaty)Fall Seasonal Vaccine 12 years and older 10/03/2020, 10/02/2022   Pneumococcal Conjugate-13 08/05/2020   Pneumococcal Polysaccharide-23 09/10/2018   Td 07/17/2022   Tdap 06/01/2011, 11/25/2012   Typhoid Live 11/01/2017   Zoster Recombinant(Shingrix ) 10/04/2017, 12/04/2017     Objective: Vital Signs: LMP 08/09/2016    Physical Exam Vitals and nursing note reviewed.  Constitutional:      Appearance: She is well-developed.  HENT:     Head: Normocephalic and atraumatic.   Eyes:     Conjunctiva/sclera: Conjunctivae normal.    Cardiovascular:     Rate and Rhythm: Normal rate and regular rhythm.     Heart sounds: Normal heart sounds.  Pulmonary:  Effort: Pulmonary effort is normal.     Breath sounds: Normal breath sounds.  Abdominal:     General: Bowel sounds are normal.     Palpations: Abdomen is soft.   Musculoskeletal:     Cervical back: Normal range of motion.  Lymphadenopathy:     Cervical: No cervical adenopathy.   Skin:    General: Skin is warm and dry.     Capillary Refill: Capillary refill takes less than 2 seconds.   Neurological:     Mental Status: She is alert and oriented to person, place, and time.   Psychiatric:        Behavior: Behavior normal.      Musculoskeletal Exam: ***  CDAI Exam: CDAI Score: -- Patient Global: --; Provider Global: -- Swollen: --; Tender: -- Joint Exam 06/24/2023   No joint exam has been documented for this visit   There is currently no information documented on the homunculus. Go to the Rheumatology activity and complete the homunculus joint exam.  Investigation: No additional findings.  Imaging: No results found.  Recent Labs: Lab Results  Component Value Date   WBC  4.0 03/25/2023   HGB 12.3 03/25/2023   PLT 221 03/25/2023   NA 138 04/26/2023   K 4.1 04/26/2023   CL 102 04/26/2023   CO2 29 04/26/2023   GLUCOSE 65 (L) 04/26/2023   BUN 12 04/26/2023   CREATININE 0.72 04/26/2023   BILITOT 0.7 04/26/2023   ALKPHOS 56 04/26/2023   AST 15 04/26/2023   ALT 16 04/26/2023   PROT 7.9 04/26/2023   ALBUMIN 4.1 04/26/2023   CALCIUM  9.5 04/26/2023   GFRAA 115 02/19/2020   QFTBGOLD Negative 06/14/2016   QFTBGOLDPLUS NEGATIVE 03/25/2023    Speciality Comments: PLQ eye exam: 12/09/2018 normal. [facility].  Procedures:  No procedures performed Allergies: Gabapentin    Assessment / Plan:     Visit Diagnoses: No diagnosis found.  Orders: No orders of the defined types were placed in this encounter.  No orders of the defined types were placed in this encounter.   Face-to-face time spent with patient was *** minutes. Greater than 50% of time was spent in counseling and coordination of care.  Follow-Up Instructions: No follow-ups on file.   Romayne Clubs, PA-C  Note - This record has been created using Dragon software.  Chart creation errors have been sought, but may not always  have been located. Such creation errors do not reflect on  the standard of medical care.

## 2023-06-24 ENCOUNTER — Encounter: Payer: Self-pay | Admitting: Physician Assistant

## 2023-06-24 ENCOUNTER — Ambulatory Visit: Attending: Physician Assistant | Admitting: Physician Assistant

## 2023-06-24 VITALS — BP 94/62 | HR 76 | Resp 15 | Ht 61.0 in | Wt 198.0 lb

## 2023-06-24 DIAGNOSIS — R5383 Other fatigue: Secondary | ICD-10-CM

## 2023-06-24 DIAGNOSIS — Z79899 Other long term (current) drug therapy: Secondary | ICD-10-CM

## 2023-06-24 DIAGNOSIS — M542 Cervicalgia: Secondary | ICD-10-CM

## 2023-06-24 DIAGNOSIS — M722 Plantar fascial fibromatosis: Secondary | ICD-10-CM

## 2023-06-24 DIAGNOSIS — M170 Bilateral primary osteoarthritis of knee: Secondary | ICD-10-CM

## 2023-06-24 DIAGNOSIS — M0579 Rheumatoid arthritis with rheumatoid factor of multiple sites without organ or systems involvement: Secondary | ICD-10-CM | POA: Diagnosis not present

## 2023-06-24 DIAGNOSIS — R002 Palpitations: Secondary | ICD-10-CM

## 2023-06-24 DIAGNOSIS — M3509 Sicca syndrome with other organ involvement: Secondary | ICD-10-CM

## 2023-06-24 DIAGNOSIS — Z8669 Personal history of other diseases of the nervous system and sense organs: Secondary | ICD-10-CM

## 2023-06-24 DIAGNOSIS — Z9889 Other specified postprocedural states: Secondary | ICD-10-CM

## 2023-06-24 DIAGNOSIS — M19071 Primary osteoarthritis, right ankle and foot: Secondary | ICD-10-CM

## 2023-06-24 DIAGNOSIS — I5032 Chronic diastolic (congestive) heart failure: Secondary | ICD-10-CM

## 2023-06-24 DIAGNOSIS — I2510 Atherosclerotic heart disease of native coronary artery without angina pectoris: Secondary | ICD-10-CM

## 2023-06-24 DIAGNOSIS — Z8261 Family history of arthritis: Secondary | ICD-10-CM

## 2023-06-24 DIAGNOSIS — R3989 Other symptoms and signs involving the genitourinary system: Secondary | ICD-10-CM

## 2023-06-24 DIAGNOSIS — E119 Type 2 diabetes mellitus without complications: Secondary | ICD-10-CM

## 2023-06-24 DIAGNOSIS — M19072 Primary osteoarthritis, left ankle and foot: Secondary | ICD-10-CM

## 2023-06-24 DIAGNOSIS — I10 Essential (primary) hypertension: Secondary | ICD-10-CM

## 2023-06-25 ENCOUNTER — Other Ambulatory Visit (INDEPENDENT_AMBULATORY_CARE_PROVIDER_SITE_OTHER): Payer: Self-pay | Admitting: Adult Health

## 2023-06-25 ENCOUNTER — Ambulatory Visit: Admitting: Physician Assistant

## 2023-06-25 ENCOUNTER — Ambulatory Visit: Payer: Self-pay | Admitting: Physician Assistant

## 2023-06-25 DIAGNOSIS — M722 Plantar fascial fibromatosis: Secondary | ICD-10-CM

## 2023-06-25 DIAGNOSIS — I10 Essential (primary) hypertension: Secondary | ICD-10-CM

## 2023-06-25 DIAGNOSIS — I5032 Chronic diastolic (congestive) heart failure: Secondary | ICD-10-CM

## 2023-06-25 DIAGNOSIS — Z9889 Other specified postprocedural states: Secondary | ICD-10-CM

## 2023-06-25 DIAGNOSIS — M170 Bilateral primary osteoarthritis of knee: Secondary | ICD-10-CM

## 2023-06-25 DIAGNOSIS — M542 Cervicalgia: Secondary | ICD-10-CM

## 2023-06-25 DIAGNOSIS — Z8261 Family history of arthritis: Secondary | ICD-10-CM

## 2023-06-25 DIAGNOSIS — M19071 Primary osteoarthritis, right ankle and foot: Secondary | ICD-10-CM

## 2023-06-25 DIAGNOSIS — E119 Type 2 diabetes mellitus without complications: Secondary | ICD-10-CM

## 2023-06-25 DIAGNOSIS — R002 Palpitations: Secondary | ICD-10-CM

## 2023-06-25 DIAGNOSIS — Z79899 Other long term (current) drug therapy: Secondary | ICD-10-CM

## 2023-06-25 DIAGNOSIS — I2510 Atherosclerotic heart disease of native coronary artery without angina pectoris: Secondary | ICD-10-CM

## 2023-06-25 DIAGNOSIS — M0579 Rheumatoid arthritis with rheumatoid factor of multiple sites without organ or systems involvement: Secondary | ICD-10-CM

## 2023-06-25 DIAGNOSIS — Z8669 Personal history of other diseases of the nervous system and sense organs: Secondary | ICD-10-CM

## 2023-06-25 DIAGNOSIS — M3509 Sicca syndrome with other organ involvement: Secondary | ICD-10-CM

## 2023-06-25 LAB — COMPREHENSIVE METABOLIC PANEL WITH GFR
AG Ratio: 1.1 (calc) (ref 1.0–2.5)
ALT: 21 U/L (ref 6–29)
AST: 16 U/L (ref 10–35)
Albumin: 4.1 g/dL (ref 3.6–5.1)
Alkaline phosphatase (APISO): 52 U/L (ref 37–153)
BUN: 11 mg/dL (ref 7–25)
CO2: 28 mmol/L (ref 20–32)
Calcium: 10.1 mg/dL (ref 8.6–10.4)
Chloride: 104 mmol/L (ref 98–110)
Creat: 0.72 mg/dL (ref 0.50–1.05)
Globulin: 3.7 g/dL (ref 1.9–3.7)
Glucose, Bld: 93 mg/dL (ref 65–99)
Potassium: 4.3 mmol/L (ref 3.5–5.3)
Sodium: 139 mmol/L (ref 135–146)
Total Bilirubin: 0.6 mg/dL (ref 0.2–1.2)
Total Protein: 7.8 g/dL (ref 6.1–8.1)
eGFR: 95 mL/min/{1.73_m2} (ref >=60)

## 2023-06-25 LAB — CBC WITH DIFFERENTIAL/PLATELET
Absolute Lymphocytes: 2500 {cells}/uL (ref 850–3900)
Absolute Monocytes: 252 {cells}/uL (ref 200–950)
Basophils Absolute: 20 {cells}/uL (ref 0–200)
Basophils Relative: 0.5 %
Eosinophils Absolute: 72 {cells}/uL (ref 15–500)
Eosinophils Relative: 1.8 %
HCT: 42.1 % (ref 35.0–45.0)
Hemoglobin: 13.1 g/dL (ref 11.7–15.5)
MCH: 28.1 pg (ref 27.0–33.0)
MCHC: 31.1 g/dL — ABNORMAL LOW (ref 32.0–36.0)
MCV: 90.1 fL (ref 80.0–100.0)
MPV: 10.5 fL (ref 7.5–12.5)
Monocytes Relative: 6.3 %
Neutro Abs: 1156 {cells}/uL — ABNORMAL LOW (ref 1500–7800)
Neutrophils Relative %: 28.9 %
Platelets: 243 10*3/uL (ref 140–400)
RBC: 4.67 10*6/uL (ref 3.80–5.10)
RDW: 13.4 % (ref 11.0–15.0)
Total Lymphocyte: 62.5 %
WBC: 4 10*3/uL (ref 3.8–10.8)

## 2023-06-25 LAB — URINALYSIS, ROUTINE W REFLEX MICROSCOPIC
Bilirubin Urine: NEGATIVE
Glucose, UA: NEGATIVE
Hgb urine dipstick: NEGATIVE
Leukocytes,Ua: NEGATIVE
Nitrite: NEGATIVE
Protein, ur: NEGATIVE
Specific Gravity, Urine: 1.019 (ref 1.001–1.035)
pH: 5.5 (ref 5.0–8.0)

## 2023-06-25 NOTE — Progress Notes (Signed)
 CMP WNL Absolute neutrophils are borderline low. Total WBC count  is WNL. We will continue to monitor lab work closely.  No change in therapy at this time.  UA revealed trace ketones. No signs of infection.  No proteinuria noted.

## 2023-06-28 ENCOUNTER — Other Ambulatory Visit: Payer: Self-pay | Admitting: Rheumatology

## 2023-06-28 DIAGNOSIS — M0579 Rheumatoid arthritis with rheumatoid factor of multiple sites without organ or systems involvement: Secondary | ICD-10-CM

## 2023-06-28 DIAGNOSIS — Z79899 Other long term (current) drug therapy: Secondary | ICD-10-CM

## 2023-06-28 NOTE — Telephone Encounter (Signed)
 Last Fill: 02/01/2023  Labs: 06/24/2023 CMP WNL Absolute neutrophils are borderline low. Total WBC count  is WNL.  TB Gold: 03/25/2023 Neg    Next Visit: 09/24/2023  Last Visit: 06/24/2023  IK:Myzlfjunpi arthritis involving multiple sites with positive rheumatoid factor   Current Dose per office note 06/24/2023: Orencia  125 mg subcutaneous every 10 days   Okay to refill Orencia ?

## 2023-06-30 NOTE — Telephone Encounter (Signed)
 Please clarify if the patient is currently injecting every 7 days or every 10 days?

## 2023-07-01 ENCOUNTER — Encounter (INDEPENDENT_AMBULATORY_CARE_PROVIDER_SITE_OTHER): Payer: Self-pay | Admitting: Family Medicine

## 2023-07-01 ENCOUNTER — Ambulatory Visit (INDEPENDENT_AMBULATORY_CARE_PROVIDER_SITE_OTHER): Admitting: Family Medicine

## 2023-07-01 VITALS — BP 138/87 | HR 78 | Temp 98.7°F | Ht 60.0 in | Wt 199.0 lb

## 2023-07-01 DIAGNOSIS — G4733 Obstructive sleep apnea (adult) (pediatric): Secondary | ICD-10-CM

## 2023-07-01 DIAGNOSIS — I5032 Chronic diastolic (congestive) heart failure: Secondary | ICD-10-CM | POA: Diagnosis not present

## 2023-07-01 DIAGNOSIS — E1159 Type 2 diabetes mellitus with other circulatory complications: Secondary | ICD-10-CM

## 2023-07-01 DIAGNOSIS — I152 Hypertension secondary to endocrine disorders: Secondary | ICD-10-CM | POA: Diagnosis not present

## 2023-07-01 DIAGNOSIS — E669 Obesity, unspecified: Secondary | ICD-10-CM

## 2023-07-01 DIAGNOSIS — Z7985 Long-term (current) use of injectable non-insulin antidiabetic drugs: Secondary | ICD-10-CM

## 2023-07-01 DIAGNOSIS — Z6838 Body mass index (BMI) 38.0-38.9, adult: Secondary | ICD-10-CM

## 2023-07-01 DIAGNOSIS — E119 Type 2 diabetes mellitus without complications: Secondary | ICD-10-CM

## 2023-07-01 MED ORDER — TIRZEPATIDE 7.5 MG/0.5ML ~~LOC~~ SOAJ: 7.5000 mg | SUBCUTANEOUS | 1 refills | Status: DC

## 2023-07-01 NOTE — Progress Notes (Signed)
 Amber [name], D.O.  ABFM, ABOM Specializing in Clinical Bariatric Medicine  Office located at: [address] [place], [place]  [address]   Assessment and Plan:  No orders of the defined types were placed in this encounter.  Medications Discontinued During This Encounter  Medication Reason   tirzepatide  (MOUNJARO ) 5 MG/0.5ML Pen Dose change    Meds ordered this encounter  Medications   tirzepatide  (MOUNJARO ) 7.5 MG/0.5ML Pen    Sig: Inject 7.5 mg into the skin once a week.    Dispense:  2 mL    Refill:  1      FOR THE DISEASE OF OBESITY: Obesity (HCC)- Start BMI 41.11 Date-12/05/22 at [facility] BMI 38.0-38.9,adult current 38.86 Assessment & Plan: This patient is established with Rockie Dalton, NP. This is my first encounter with this patient.  Since last office visit on 05/29/23 patient's muscle mass has decreased by 7.6 lbs. Fat mass has increased by 8.6 lbs. Total body water has increased by 3.6 lbs.  Counseling done on how various foods will affect these numbers and how to maximize success  Total lbs lost to date: 15 lbs Total weight loss percentage to date: 7.01    Recommended Dietary Goals Amber [name] is currently in the action stage of change. As such, her goal is to continue weight management plan.  She has agreed to: continue current plan   Behavioral Intervention We discussed the following today: increasing lean protein intake to established goals, decreasing simple carbohydrates , avoiding skipping meals, increasing water intake , and practice mindfulness eating and understand the difference between hunger signals and cravings  Additional resources provided today: None  Evidence-based interventions for health behavior change were utilized today including the discussion of self monitoring techniques, problem-solving barriers and SMART goal setting techniques.   Regarding patient's less desirable eating habits and patterns, we employed the technique of small  changes.   Pt will specifically work on: Aiming for 7-9 hours of sleep   Recommended Physical Activity Goals Amber [name] has been advised to work up to 300-450 minutes of moderate intensity aerobic activity a week and strengthening exercises 2-3 times per week for cardiovascular health, weight loss maintenance and preservation of muscle mass.   She has agreed to: Continue current level of physical activity , Increase physical activity in their day and reduce sedentary time (increase NEAT)., and Increase the intensity, frequency or duration of aerobic exercises     Pharmacotherapy We both agreed to: Continue with current nutritional and behavioral strategies and INCREASE Mounjaro  to 7.5 mg once weekly for T2DM, see details below.    ASSOCIATED CONDITIONS ADDRESSED TODAY: Chronic diastolic CHF (congestive heart failure) (HCC) Hypertension associated with diabetes [facility]) Assessment & Plan: BP Readings from Last 3 Encounters:  07/01/23 138/87  06/24/23 94/62  05/29/23 117/67   Condition is well controlled, per pt. Currently compliant with Coreg  25 mg BID, HCTZ daily, Diltiazem  240 mg once daily, Maxzide 37.5-25 mg once daily, and Valsartan  320 mg once daily. She reports occasional bilateral ankle edema at nighttime after working a 12 hours shift, but is otherwise asymptomatic. Her visceral fat rating has increased in the last month from 12 on 05/29/23 to 15 as of today.  Advised pt to continue working on following a heart-healthy diet via her meal plan by reducing high-sodium foods, decrease consumption of pre-packaged meals, and increase water intake. Educated pt how elevated visceral fat rating increase her risk for cardiovascular disease. Reviewed how lifestyle modifications will help improve her  visceral fat rating. Encouraged pt to monitor her BP at home, especially if she every feels poorly in any way. Will continue to monitor alongside her PCP/specialists.    Controlled type 2 diabetes  mellitus without complication, without long-term current use of insulin  [facility]) Assessment & Plan: Lab Results  Component Value Date   HGBA1C 6.4 04/26/2023   HGBA1C 6.4 01/25/2023   HGBA1C 6.7 (H) 10/17/2022   INSULIN  17.5 12/05/2022   INSULIN  16.5 09/04/2017    Compliant with Mounjaro  5 mg once weekly. Currently tolerating well with no adverse side effects. She reports experiencing occasional nausea with her first 2 doses, which has since improved. Pt has increased cravings for sweets, fried foods, and carbs and she requests to go up on her Mounjaro  for better control of these cravings. Reviewed risks/benefits of Mounjaro . Reminded pt of the importance to eat on meal to avoid any potential side effects. Additionally, I would highly recommend she try to eat all her must-eats before eating any snacks. We mutually agreed to INCREASE Mounjaro  to 7.5 mg once weekly. Pt verbalized understanding and agreement to this plan. Will continue to monitor condition.     OSA on CPAP Assessment & Plan: Compliant with CPAP therapy with good tolerance. She has been using a nasal cannula CPAP mask. On averages she sleeps 5-6 hours per night. Next follow up with her sleep specialist is scheduled in 10/2023. Continue with CPAP therapy as instructed. 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 Follow up w her PCP and sleep specialist as instructed by them and will continue monitoring condition alongside these individuals.     Follow up:   Return in about 4 weeks (around 07/29/2023). She was informed of the importance of frequent follow up visits to maximize her success with intensive lifestyle modifications for her multiple health conditions.   Subjective:   Chief complaint: Obesity Amber [name] is here to discuss her progress with her obesity treatment plan. She is on the Category 2 Plan and states she is following her  eating plan approximately 60% of the time. She states she is walking while at work for 15 minutes 2 days per week.   Interval History:  Amber [name] is here for a follow up office visit. This patient is established with Rockie Dalton, NP. This is my first encounter with this patient.  Since last OV on 05/29/23 , she has maintained her weight.  Has recently made 2 trips for a funeral and a graduation; returned home about 9 days ago. She reports having sweets and fried foods during this time.    Pharmacotherapy that aid with weight loss: She is currently taking Mounjaro  5 mg once weekly.   Review of Systems:  Pertinent positives were addressed with patient today.  Reviewed by clinician on day of visit: allergies, medications, problem list, medical history, surgical history, family history, social history, and previous encounter notes.  Weight Summary and Biometrics   Weight Lost Since Last Visit: 0lb  Weight Gained Since Last Visit: 0lb    Vitals Temp: 98.7 F (37.1 C) BP: 138/87 Pulse Rate: 78 SpO2: 100 %   Anthropometric Measurements Height: 5' (1.524 m) Weight: 199 lb (90.3 kg) BMI (Calculated): 38.86 Weight at Last Visit: 198lb Weight Lost Since Last Visit: 0lb Weight Gained Since Last Visit: 0lb Starting Weight: 214lb Total Weight Loss (lbs): 15 lb (6.804 kg) Peak Weight: 218lb  Body Composition  Body Fat %: 47.6 % Fat Mass (lbs): 95 lbs Muscle Mass (lbs): 99.2 lbs Total Body Water (lbs): 77.6 lbs Visceral Fat Rating : 15   Other Clinical Data Fasting: No Labs: No Today's Visit #: 6 Starting Date: 12/05/22 Comments: Cat 2 / Rechecked height    Objective:   PHYSICAL EXAM: Blood pressure 138/87, pulse 78, temperature 98.7 F (37.1 C), height 5' (1.524 m), weight 199 lb (90.3 kg), last menstrual period 08/09/2016, SpO2 100%. Body mass index is 38.86 kg/m.  General: she is overweight, cooperative and in no acute distress. PSYCH: Has normal mood,  affect and thought process.   HEENT: EOMI, sclerae are anicteric. Lungs: Normal breathing effort, no conversational dyspnea. Extremities: Moves * 4 Neurologic: A and O * 3, good insight  DIAGNOSTIC DATA REVIEWED: BMET    Component Value Date/Time   NA 139 06/24/2023 0827   NA 137 12/05/2022 0942   NA 138 01/21/2015 0935   K 4.3 06/24/2023 0827   K 3.9 01/21/2015 0935   CL 104 06/24/2023 0827   CL 108 (H) 06/25/2012 1508   CO2 28 06/24/2023 0827   CO2 25 01/21/2015 0935   GLUCOSE 93 06/24/2023 0827   GLUCOSE 128 01/21/2015 0935   GLUCOSE 107 (H) 06/25/2012 1508   BUN 11 06/24/2023 0827   BUN 12 12/05/2022 0942   BUN 7.0 01/21/2015 0935   CREATININE 0.72 06/24/2023 0827   CREATININE 0.8 01/21/2015 0935   CALCIUM  10.1 06/24/2023 0827   CALCIUM  9.4 01/21/2015 0935   GFRNONAA 38 (L) 09/25/2022 2132   GFRNONAA 99 02/19/2020 1339   GFRAA 115 02/19/2020 1339   Lab Results  Component Value Date   HGBA1C 6.4 04/26/2023   HGBA1C 6.0 (H) 09/15/2010   Lab Results  Component Value Date   INSULIN  17.5 12/05/2022   INSULIN  16.5 09/04/2017   Lab Results  Component Value Date   TSH 1.560 12/05/2022   CBC    Component Value Date/Time   WBC 4.0 06/24/2023 0827   RBC 4.67 06/24/2023 0827   HGB 13.1 06/24/2023 0827   HGB 13.8 10/27/2018 1145   HGB 12.4 09/13/2016 0935   HCT 42.1 06/24/2023 0827   HCT 40.0 12/05/2022 0942   HCT 39.6 09/13/2016 0935   PLT 243 06/24/2023 0827   PLT 208 10/03/2018 1512   PLT 194 09/13/2016 0935   MCV 90.1 06/24/2023 0827   MCV 87.8 09/13/2016 0935   MCH 28.1 06/24/2023 0827   MCHC 31.1 (L) 06/24/2023 0827   RDW 13.4 06/24/2023 0827   RDW 14.4 09/13/2016 0935   Iron  Studies    Component Value Date/Time   IRON  92 12/05/2022 0942   IRON  79 09/13/2016 0935   TIBC 303 12/05/2022 0942   TIBC 326 09/13/2016 0935   FERRITIN 120 12/05/2022 0942   FERRITIN 40 09/13/2016 0935   IRONPCTSAT 30 12/05/2022 0942   IRONPCTSAT 29 07/17/2022 0837    Lipid Panel     Component Value Date/Time   CHOL 120 12/05/2022 0942   TRIG 85 12/05/2022 0942   HDL 54 12/05/2022 0942   CHOLHDL 3 07/17/2022 0837   VLDL 19.6 07/17/2022 0837   LDLCALC 49 12/05/2022 0942   LDLCALC 73 09/21/2019 1137   Hepatic Function Panel     Component Value Date/Time   PROT 7.8 06/24/2023 0827   PROT 8.8 (H) 12/05/2022 0942   PROT 9.2 (H) 01/21/2015 0935   ALBUMIN 4.1 04/26/2023 1119   ALBUMIN 4.3 12/05/2022 0942  ALBUMIN 3.5 01/21/2015 0935   AST 16 06/24/2023 0827   AST 17 01/21/2015 0935   ALT 21 06/24/2023 0827   ALT 15 01/21/2015 0935   ALKPHOS 56 04/26/2023 1119   ALKPHOS 54 01/21/2015 0935   BILITOT 0.6 06/24/2023 0827   BILITOT 0.8 12/05/2022 0942   BILITOT 0.55 01/21/2015 0935   BILIDIR 0.2 09/10/2018 1040      Component Value Date/Time   TSH 1.560 12/05/2022 0942   Nutritional Lab Results  Component Value Date   VD25OH 29.4 (L) 12/05/2022   VD25OH 37.2 09/04/2017    Attestations:   I, Amber [name], acting as a medical scribe for Amber Jenkins, DO., have compiled all relevant documentation for today's office visit on behalf of Amber Jenkins, DO, while in the presence of [employer], DO.  Reviewed by clinician on day of visit: allergies, medications, problem list, medical history, surgical history, family history, social history, and previous encounter notes pertinent to patient's obesity diagnosis.  I have reviewed the above documentation for accuracy and completeness, and I agree with the above. Amber JINNY [name], D.O.  The 21st Century Cures Act was signed into law in 2016 which includes the topic of electronic health records.  This provides immediate access to information in MyChart.  This includes consultation notes, operative notes, office notes, lab results and pathology reports.  If you have any questions about what you read please let us  know at your next visit so we can discuss your concerns and take corrective  action if need be.  We are right here with you.

## 2023-07-01 NOTE — Telephone Encounter (Signed)
 Taking every 10 days, written every 7 due to insurance reasons.

## 2023-07-23 ENCOUNTER — Ambulatory Visit: Admitting: Family

## 2023-07-25 ENCOUNTER — Ambulatory Visit: Admitting: Family

## 2023-07-29 ENCOUNTER — Ambulatory Visit (INDEPENDENT_AMBULATORY_CARE_PROVIDER_SITE_OTHER): Admitting: Family Medicine

## 2023-07-31 ENCOUNTER — Encounter (INDEPENDENT_AMBULATORY_CARE_PROVIDER_SITE_OTHER): Payer: Self-pay | Admitting: Family Medicine

## 2023-07-31 ENCOUNTER — Ambulatory Visit (INDEPENDENT_AMBULATORY_CARE_PROVIDER_SITE_OTHER): Admitting: Family Medicine

## 2023-07-31 DIAGNOSIS — Z6836 Body mass index (BMI) 36.0-36.9, adult: Secondary | ICD-10-CM

## 2023-07-31 DIAGNOSIS — Z7985 Long-term (current) use of injectable non-insulin antidiabetic drugs: Secondary | ICD-10-CM

## 2023-07-31 DIAGNOSIS — I152 Hypertension secondary to endocrine disorders: Secondary | ICD-10-CM | POA: Diagnosis not present

## 2023-07-31 DIAGNOSIS — E669 Obesity, unspecified: Secondary | ICD-10-CM | POA: Diagnosis not present

## 2023-07-31 DIAGNOSIS — E1159 Type 2 diabetes mellitus with other circulatory complications: Secondary | ICD-10-CM | POA: Diagnosis not present

## 2023-07-31 DIAGNOSIS — E119 Type 2 diabetes mellitus without complications: Secondary | ICD-10-CM

## 2023-07-31 NOTE — Progress Notes (Signed)
 Amber [name], D.O.  ABFM, ABOM Specializing in Clinical Bariatric Medicine  Office located at: [address] [place], [place]  [address]     Assessment and Plan:    FOR THE DISEASE OF OBESITY:  Obesity (HCC)- Start BMI 41.11 Date-12/05/22 at [facility] BMI 36.0-36.9,adult current 36.50 Assessment & Plan: Since last office visit on 07/01/23 patient's muscle mass has decreased by 1.2 lbs. Fat mass has decreased by 7.4 lbs. Total body water has decreased by 8 lbs.  Body fat % has decreased by 1.7%. Counseling done on how various foods will affect these numbers and how to maximize success  Total lbs lost to date: 24 lbs Total weight loss percentage to date: -11.21%   Recommended Dietary Goals Amber [name] is currently in the action stage of change. As such, her goal is to continue weight management plan.   Encouraged pt to consider starting journaling in the future; reviewed parameters of 1000-1100 calories and 75+++ g of protein if she decides to journal. Consider having her journal what she eats so she can be more mindful when eating her favorite off-plan foods (goat and plantain).   She has agreed to: continue current plan on CAT 2 MP    Behavioral Intervention We discussed the following today: increasing lean protein intake to established goals, decreasing simple carbohydrates , work on meal planning and preparation, work on tracking and journaling calories using tracking application, keeping healthy foods at home, and decreasing eating out or consumption of processed foods, and making healthy choices when eating convenient foods. Reviewed healthier alternatives for her favorite foods. Educated pt on importance of meeting protein goals and staying within calorie deficit.  Additional resources provided today: Handout on Daily Food Journaling Log and Handout on Low Calorie Yeast Roll options   Evidence-based interventions for health behavior change were utilized today including  the discussion of self monitoring techniques, problem-solving barriers and SMART goal setting techniques.   Regarding patient's less desirable eating habits and patterns, we employed the technique of small changes.   Pt will specifically work on: trying recipes of healthier alternative of the cultural foods she enjoys (Handouts provided today and using Chat GPT)    Recommended Physical Activity Goals Amber [name] has been advised to work up to 300-450 minutes of moderate intensity aerobic activity a week and strengthening exercises 2-3 times per week for cardiovascular health, weight loss maintenance and preservation of muscle mass.   She has agreed to: Continue current level of physical activity , Increase physical activity in their day and reduce sedentary time (increase NEAT)., and increase duration and intensity of walking and/or consider adding strength training to her exercise routine in the future.    Pharmacotherapy We both agreed to: Continue with current nutritional and behavioral strategies and continue meds that aid in wt loss (Mounjaro ).    ASSOCIATED CONDITIONS ADDRESSED TODAY:  Controlled type 2 diabetes mellitus without complication, without long-term current use of insulin  [facility]) Assessment & Plan: Lab Results  Component Value Date   HGBA1C 6.4 04/26/2023   HGBA1C 6.4 01/25/2023   HGBA1C 6.7 (H) 10/17/2022   INSULIN  17.5 12/05/2022   INSULIN  16.5 09/04/2017    Currently on Mounjaro  7.5 mg once weekly with good compliance and tolerance. Mounjaro  was increase at LOV on 6/30. No adverse side effects with this dose increase. Denies any GI upset or adverse SE. Has not found it difficult to eat all her portions at meal time since increasing her dose.   Continue Mounjaro  at  current dose. Encouraged pt to increase her protein intake at each meal. Avoid skipping meals and ensure she is eating all her food at each meal. Decrease simple carbs and added sugars. Reviewed healthier  alternatives to cultural foods she enjoys to encourage sticking to her low-calorie, high protein, and low-carb diet. Will continue monitoring condition.     Hypertension associated with diabetes [facility]) Assessment & Plan: BP Readings from Last 3 Encounters:  07/31/23 112/75  07/01/23 138/87  06/24/23 94/62   BP is at goal. Currently on Carvedilol  12.5 mg BID, Diltiazem  240 mg once daily, Maxide 37.5-25 mg once daily, and Valsartan  320 mg once daily; all managed by PCP. Good compliance and tolerance reported. Denies any hypotensive or hypertensive episodes. Since LOV, she has cut back on additional seasonings containing salt and reduced snacking.   Continue with current antihypertensive regimen as prescribed. Focus on limiting high sodium foods. Decrease simple carbs and increase protein. Reviewed how increasing exercise may increase her cardiovascular health and promote weight loss. F/u with PCP as instructed by them. Will continue monitoring alongside PCP.     Follow up:   Return in about 4 weeks (around 08/28/2023) for 4 week f/u. She was informed of the importance of frequent follow up visits to maximize her success with intensive lifestyle modifications for her multiple health conditions.  Subjective:   Chief complaint: Obesity Amber is here to discuss her progress with her obesity treatment plan. She is on the Category 2 Plan and states she is following her eating plan approximately 85% of the time. She states she is walking 15-20 minutes 4 days per week.   Interval History:  Amber [name] is here for a follow up office visit. Since last OV on 06/30/33, she is down 9 lbs. She has been more intentional about following her meal plan. She has struggled to stay on plan, stating she would like more variety. She is [place] misses the spices and flavors of her culture's diet as well as favorite off-plan foods such as yeast rolls and butter. She has been eating  45 calorie bread. She cut out snacks completely, did not add any additional seasonings to her eggs.    Pharmacotherapy that aid with weight loss: She is currently taking Mounjaro  7.5 mg once weekly.   Review of Systems:  Pertinent positives were addressed with patient today.  Reviewed by clinician on day of visit: allergies, medications, problem list, medical history, surgical history, family history, social history, and previous encounter notes.  Weight Summary and Biometrics   Weight Lost Since Last Visit: 9lb  Weight Gained Since Last Visit: 0lb   Vitals Temp: 98.9 F (37.2 C) BP: 112/75 Pulse Rate: 77 SpO2: 100 %   Anthropometric Measurements Height: 5' 0.5 (1.537 m) Weight: 190 lb (86.2 kg) BMI (Calculated): 36.48 Weight at Last Visit: 199lb Weight Lost Since Last Visit: 9lb Weight Gained Since Last Visit: 0lb Starting Weight: 214lb Total Weight Loss (lbs): 24 lb (10.9 kg) Peak Weight: 218lb   Body Composition  Body Fat %: 45.9 % Fat Mass (lbs): 87.6 lbs Muscle Mass (lbs): 98 lbs Total Body Water (lbs): 69.6 lbs Visceral Fat Rating : 14   Other Clinical Data Fasting: No Labs: no Today's Visit #: 7 Starting Date: 12/05/22 Comments: Cat 2    Objective:   PHYSICAL EXAM: Blood pressure 112/75, pulse 77, temperature 98.9 F (37.2 C), height 5' 0.5 (1.537 m), weight 190 lb (86.2 kg), last menstrual period 08/09/2016, SpO2 100%.  Body mass index is 36.5 kg/m.  General: she is overweight, cooperative and in no acute distress. PSYCH: Has normal mood, affect and thought process.   HEENT: EOMI, sclerae are anicteric. Lungs: Normal breathing effort, no conversational dyspnea. Extremities: Moves * 4 Neurologic: A and O * 3, good insight  DIAGNOSTIC DATA REVIEWED: BMET    Component Value Date/Time   NA 139 06/24/2023 0827   NA 137 12/05/2022 0942   NA 138 01/21/2015 0935   K 4.3 06/24/2023 0827   K 3.9 01/21/2015 0935   CL 104 06/24/2023 0827    CL 108 (H) 06/25/2012 1508   CO2 28 06/24/2023 0827   CO2 25 01/21/2015 0935   GLUCOSE 93 06/24/2023 0827   GLUCOSE 128 01/21/2015 0935   GLUCOSE 107 (H) 06/25/2012 1508   BUN 11 06/24/2023 0827   BUN 12 12/05/2022 0942   BUN 7.0 01/21/2015 0935   CREATININE 0.72 06/24/2023 0827   CREATININE 0.8 01/21/2015 0935   CALCIUM  10.1 06/24/2023 0827   CALCIUM  9.4 01/21/2015 0935   GFRNONAA 38 (L) 09/25/2022 2132   GFRNONAA 99 02/19/2020 1339   GFRAA 115 02/19/2020 1339   Lab Results  Component Value Date   HGBA1C 6.4 04/26/2023   HGBA1C 6.0 (H) 09/15/2010   Lab Results  Component Value Date   INSULIN  17.5 12/05/2022   INSULIN  16.5 09/04/2017   Lab Results  Component Value Date   TSH 1.560 12/05/2022   CBC    Component Value Date/Time   WBC 4.0 06/24/2023 0827   RBC 4.67 06/24/2023 0827   HGB 13.1 06/24/2023 0827   HGB 13.8 10/27/2018 1145   HGB 12.4 09/13/2016 0935   HCT 42.1 06/24/2023 0827   HCT 40.0 12/05/2022 0942   HCT 39.6 09/13/2016 0935   PLT 243 06/24/2023 0827   PLT 208 10/03/2018 1512   PLT 194 09/13/2016 0935   MCV 90.1 06/24/2023 0827   MCV 87.8 09/13/2016 0935   MCH 28.1 06/24/2023 0827   MCHC 31.1 (L) 06/24/2023 0827   RDW 13.4 06/24/2023 0827   RDW 14.4 09/13/2016 0935   Iron  Studies    Component Value Date/Time   IRON  92 12/05/2022 0942   IRON  79 09/13/2016 0935   TIBC 303 12/05/2022 0942   TIBC 326 09/13/2016 0935   FERRITIN 120 12/05/2022 0942   FERRITIN 40 09/13/2016 0935   IRONPCTSAT 30 12/05/2022 0942   IRONPCTSAT 29 07/17/2022 0837   Lipid Panel     Component Value Date/Time   CHOL 120 12/05/2022 0942   TRIG 85 12/05/2022 0942   HDL 54 12/05/2022 0942   CHOLHDL 3 07/17/2022 0837   VLDL 19.6 07/17/2022 0837   LDLCALC 49 12/05/2022 0942   LDLCALC 73 09/21/2019 1137   Hepatic Function Panel     Component Value Date/Time   PROT 7.8 06/24/2023 0827   PROT 8.8 (H) 12/05/2022 0942   PROT 9.2 (H) 01/21/2015 0935   ALBUMIN 4.1  04/26/2023 1119   ALBUMIN 4.3 12/05/2022 0942   ALBUMIN 3.5 01/21/2015 0935   AST 16 06/24/2023 0827   AST 17 01/21/2015 0935   ALT 21 06/24/2023 0827   ALT 15 01/21/2015 0935   ALKPHOS 56 04/26/2023 1119   ALKPHOS 54 01/21/2015 0935   BILITOT 0.6 06/24/2023 0827   BILITOT 0.8 12/05/2022 0942   BILITOT 0.55 01/21/2015 0935   BILIDIR 0.2 09/10/2018 1040      Component Value Date/Time   TSH 1.560 12/05/2022 0942   Nutritional Lab  Results  Component Value Date   VD25OH 29.4 (L) 12/05/2022   VD25OH 37.2 09/04/2017    Attestations:   I, Vernell Forest, acting as a medical scribe for Amber Jenkins, DO., have compiled all relevant documentation for today's office visit on behalf of Amber Jenkins, DO, while in the presence of [employer], DO.  Reviewed by clinician on day of visit: allergies, medications, problem list, medical history, surgical history, family history, social history, and previous encounter notes pertinent to patient's obesity diagnosis.  I have spent 40 minutes in the care of the patient today including 31 minutes face-to-face assessing and reviewing listed medical problems above as outlined in office visit note and providing nutritional and behavioral counseling as outlined in obesity care plan.   I have reviewed the above documentation for accuracy and completeness, and I agree with the above. Amber JINNY [name], D.O.  The 21st Century Cures Act was signed into law in 2016 which includes the topic of electronic health records.  This provides immediate access to information in MyChart.  This includes consultation notes, operative notes, office notes, lab results and pathology reports.  If you have any questions about what you read please let us  know at your next visit so we can discuss your concerns and take corrective action if need be.  We are right here with you.

## 2023-08-01 LAB — HM DIABETES EYE EXAM

## 2023-08-06 ENCOUNTER — Ambulatory Visit: Payer: Self-pay | Admitting: Family

## 2023-08-06 ENCOUNTER — Ambulatory Visit (INDEPENDENT_AMBULATORY_CARE_PROVIDER_SITE_OTHER): Admitting: Family

## 2023-08-06 VITALS — BP 80/52 | HR 92 | Temp 98.0°F | Resp 16 | Ht 60.0 in | Wt 191.0 lb

## 2023-08-06 DIAGNOSIS — J45909 Unspecified asthma, uncomplicated: Secondary | ICD-10-CM

## 2023-08-06 DIAGNOSIS — D508 Other iron deficiency anemias: Secondary | ICD-10-CM

## 2023-08-06 DIAGNOSIS — G43009 Migraine without aura, not intractable, without status migrainosus: Secondary | ICD-10-CM

## 2023-08-06 DIAGNOSIS — E559 Vitamin D deficiency, unspecified: Secondary | ICD-10-CM | POA: Diagnosis not present

## 2023-08-06 DIAGNOSIS — E119 Type 2 diabetes mellitus without complications: Secondary | ICD-10-CM | POA: Diagnosis not present

## 2023-08-06 DIAGNOSIS — I10 Essential (primary) hypertension: Secondary | ICD-10-CM

## 2023-08-06 DIAGNOSIS — Z7985 Long-term (current) use of injectable non-insulin antidiabetic drugs: Secondary | ICD-10-CM

## 2023-08-06 DIAGNOSIS — I5032 Chronic diastolic (congestive) heart failure: Secondary | ICD-10-CM

## 2023-08-06 DIAGNOSIS — G4733 Obstructive sleep apnea (adult) (pediatric): Secondary | ICD-10-CM

## 2023-08-06 DIAGNOSIS — E785 Hyperlipidemia, unspecified: Secondary | ICD-10-CM

## 2023-08-06 DIAGNOSIS — E538 Deficiency of other specified B group vitamins: Secondary | ICD-10-CM

## 2023-08-06 DIAGNOSIS — G6289 Other specified polyneuropathies: Secondary | ICD-10-CM | POA: Diagnosis not present

## 2023-08-06 DIAGNOSIS — M0579 Rheumatoid arthritis with rheumatoid factor of multiple sites without organ or systems involvement: Secondary | ICD-10-CM

## 2023-08-06 DIAGNOSIS — H409 Unspecified glaucoma: Secondary | ICD-10-CM

## 2023-08-06 DIAGNOSIS — I2510 Atherosclerotic heart disease of native coronary artery without angina pectoris: Secondary | ICD-10-CM

## 2023-08-06 DIAGNOSIS — K219 Gastro-esophageal reflux disease without esophagitis: Secondary | ICD-10-CM

## 2023-08-06 DIAGNOSIS — E66812 Obesity, class 2: Secondary | ICD-10-CM

## 2023-08-06 LAB — BASIC METABOLIC PANEL WITH GFR
BUN: 12 mg/dL (ref 6–23)
CO2: 30 meq/L (ref 19–32)
Calcium: 9.7 mg/dL (ref 8.4–10.5)
Chloride: 104 meq/L (ref 96–112)
Creatinine, Ser: 1.3 mg/dL — ABNORMAL HIGH (ref 0.40–1.20)
GFR: 44.2 mL/min — ABNORMAL LOW (ref >60.00)
Glucose, Bld: 106 mg/dL — ABNORMAL HIGH (ref 70–99)
Potassium: 4.2 meq/L (ref 3.5–5.1)
Sodium: 140 meq/L (ref 135–145)

## 2023-08-06 LAB — VITAMIN D 25 HYDROXY (VIT D DEFICIENCY, FRACTURES): VITD: 34.49 ng/mL (ref 30.00–100.00)

## 2023-08-06 LAB — HEMOGLOBIN A1C: Hgb A1c MFr Bld: 6.1 % (ref 4.6–6.5)

## 2023-08-06 MED ORDER — CYANOCOBALAMIN 1000 MCG/ML IJ SOLN: INTRAMUSCULAR | 0 refills | Status: AC

## 2023-08-06 MED ORDER — VALSARTAN 320 MG PO TABS: 320.0000 mg | ORAL_TABLET | Freq: Every day | ORAL | 1 refills | Status: DC

## 2023-08-06 MED ORDER — ROSUVASTATIN CALCIUM 10 MG PO TABS: 10.0000 mg | ORAL_TABLET | Freq: Every day | ORAL | 1 refills | Status: AC

## 2023-08-06 MED ORDER — TRIAMTERENE-HCTZ 37.5-25 MG PO TABS: 1.0000 | ORAL_TABLET | Freq: Every day | ORAL | 1 refills | Status: AC

## 2023-08-06 MED ORDER — GABAPENTIN 100 MG PO CAPS: 100.0000 mg | ORAL_CAPSULE | Freq: Every day | ORAL | 1 refills | Status: AC

## 2023-08-06 MED ORDER — CARVEDILOL 12.5 MG PO TABS: 12.5000 mg | ORAL_TABLET | Freq: Two times a day (BID) | ORAL | 1 refills | Status: AC

## 2023-08-06 MED ORDER — DILTIAZEM HCL ER COATED BEADS 240 MG PO CP24: 240.0000 mg | ORAL_CAPSULE | Freq: Every day | ORAL | 1 refills | Status: DC

## 2023-08-06 NOTE — Patient Instructions (Signed)
 VISIT SUMMARY:  Today, we reviewed your ongoing health conditions, including hypertension, obesity, type 2 diabetes, peripheral neuropathy, rheumatoid arthritis, obstructive sleep apnea, migraines, glaucoma, hyperlipidemia, asthma, and vitamin D  deficiency. Adjustments were made to your hypertension medication, and we discussed your weight loss progress and other treatments.  YOUR PLAN:  HYPERTENSION: Your blood pressure management is ongoing, and recent weight loss may be affecting it. -Reduce carvedilol  to 12.5 mg twice daily. -A prescription for 12.5 mg carvedilol  tablets will be sent to the pharmacy. -You will receive a 90-day supply from [employer].  OBESITY: You have been working on weight loss, and your BMI has decreased from 39 to 37. -Continue with your current weight loss efforts.  TYPE 2 DIABETES MELLITUS: Your type 2 diabetes is well-controlled with a last A1c of 6.4% in April. -We will update your A1c level today.  PERIPHERAL NEUROPATHY: Your peripheral neuropathy has been worsening, and previous treatment with gabapentin  caused drowsiness. -Prescribe gabapentin  100 mg to be taken at bedtime.  RHEUMATOID ARTHRITIS: Your rheumatoid arthritis is being managed with Orencia , and you have reported improvement in symptoms. -Continue with Orencia  as prescribed.  OBSTRUCTIVE SLEEP APNEA: Your sleep apnea is managed with CPAP therapy, and the current setup is working well. -Continue using your CPAP machine with the optimized nasal mask.  MIGRAINE: Your migraines are being managed with Ajovy , which you find more effective than Emgality . -Follow-up with neurology is scheduled for October.  GLAUCOMA: Your glaucoma is managed by Dr. Abigail, and recent eye inflammation may be related to autoimmune conditions. -Continue following up with Dr. Abigail for your glaucoma management.  HYPERLIPIDEMIA: Your cholesterol levels are managed with Crestor , and your last check in December was  normal. -Continue taking Crestor  10 mg as prescribed.  ASTHMA: Your asthma is well-managed. -Use albuterol  as needed.  VITAMIN D  DEFICIENCY: You are being treated for vitamin D  deficiency with 50,000 IU weekly. -We will update your vitamin D  level today.

## 2023-08-06 NOTE — Assessment & Plan Note (Signed)
 Stable without medication

## 2023-08-06 NOTE — Telephone Encounter (Signed)
 Please advise pt that her kidney function has decreased. Make sure she is avoiding NSAIDS and staying hydrated. I would like for her to schedule follow up with her nephrologist, Dr. Macel. If she needs a new referral please let me know.  A1C looks good at 6.1 and Vit D is normal. No med changes.

## 2023-08-06 NOTE — Assessment & Plan Note (Signed)
 She is working hard with Weight loss clinic and has lost 25 pounds.

## 2023-08-06 NOTE — Assessment & Plan Note (Signed)
 Stable. Has albuterol  on hand for PRN use.

## 2023-08-06 NOTE — Assessment & Plan Note (Signed)
 Lab Results  Component Value Date   CHOL 120 12/05/2022   HDL 54 12/05/2022   LDLCALC 49 12/05/2022   TRIG 85 12/05/2022   CHOLHDL 3 07/17/2022   At goal on Crestor  10mg  once daily.

## 2023-08-06 NOTE — Assessment & Plan Note (Signed)
 Maintained on supplement per healthy weight and wellness.

## 2023-08-06 NOTE — Assessment & Plan Note (Signed)
 Continues b12 injections.

## 2023-08-06 NOTE — Progress Notes (Signed)
 Subjective:     Patient ID: Amber [name], female    DOB: 30-Nov-1960, 63 y.o.   MRN: [id number]  Chief Complaint  Patient presents with   Diabetes    Here for follow up   Hypertension    Here for follow up    Diabetes  Hypertension    Discussed the use of AI scribe software for clinical note transcription with the patient, who gave verbal consent to proceed.  History of Present Illness  Amber [name] is a 63 year old female with hypertension and rheumatoid arthritis who presents for a follow-up visit. She manages hypertension with carvedilol , diltiazem  CD, and a combination of triamterene  and hydrochlorothiazide . Recent weight loss of 25 pounds with Mounjaro  may be affecting her blood pressure. For rheumatoid arthritis, she resumed Orencia  after a delay in insurance approval and notes improvement in symptoms. Neuropathy worsens at night. Gabapentin  was previously tried but caused excessive drowsiness. Weight loss efforts continue, with a reduction in BMI from 39 to 37. She uses a CPAP machine for sleep apnea, which is functioning well after mask optimization. She is under the care of a neurologist for migraines and finds Ajovy  more effective than Emgality . She plans a follow-up in October. She is monitored for glaucoma by Dr. Abigail and recently experienced eye inflammation related to autoimmune conditions. Her last A1c was 6.4 in April. She is on Crestor  and aspirin  for heart attack prevention. Vitamin D  supplementation continues at 50,000 IU weekly.      Health Maintenance Due  Topic Date Due   INFLUENZA VACCINE  08/02/2023    Past Medical History:  Diagnosis Date   Anemia    Arthritis    Asthma 05/03/2020   Back pain    Blood transfusion without reported diagnosis    CAD (coronary artery disease) 04/24/2017   Cataract    Cerebrovascular disease 10/13/2020   Chronic diastolic CHF (congestive heart failure) (HCC) 04/24/2017   Colon polyps    Controlled type 2  diabetes mellitus without complication, without long-term current use of insulin  (HCC) 05/31/2017   Diabetic peripheral neuropathy (HCC) 01/05/2020   Ectopic pregnancy    RIGHT salpingostomy   Elective abortion    Elective abortion 09/26/2020   RIGHT salpingostomy   Essential hypertension 09/04/2017   GERD (gastroesophageal reflux disease)    Glaucoma    both eyes   Headache    migraines   History of chicken pox    History of chicken pox 09/26/2020   migraines   Hyperlipemia    Hypertension    Iron  deficiency anemia 09/02/2013   Joint pain    Migraine 04/19/2014   Obesity (BMI 30-39.9) 10/17/2012   Palpitations 10/01/2012   Pre-diabetes    Rheumatoid arthritis (HCC) 11/07/2011   Rheumatoid arthritis(714.0) 11/07/2011   SAB (spontaneous abortion)    SAB (spontaneous abortion) 11/07/2011   Shortness of breath    Sjogren's disease (HCC)    Swelling of lower extremity    Vitamin B12 deficiency    Wheezing     Past Surgical History:  Procedure Laterality Date   ABDOMINAL SURGERY     bowel repair   CATARACT EXTRACTION     02/25/2023, 03/11/2023   COLONOSCOPY     COLONOSCOPY  08/2022   DILATION AND CURETTAGE OF UTERUS     X 2   KNEE ARTHROSCOPY W/ MENISCAL REPAIR Right 01/16/2023   MENISCUS REPAIR Right    MENISCUS TEAR REPAIR   MYOMECTOMY N/A 07/27/2014   Procedure:  ABDOMINAL MYOMECTOMY;  Surgeon: Jerolyn Foil, MD;  Location: [facility];  Service: Gynecology;  Laterality: N/A;   PELVIC LAPAROSCOPY  1994   IN 1999 LAPAROSCOPY WITH INCIDENTAL ENTEROTOMY REQUIRING LAPAROTOMY   ROTATOR CUFF REPAIR Left 06/21/2021   UTERINE FIBROID SURGERY     July 2016    Family History  Problem Relation Age of Onset   Hypertension Mother        died from heart disease   Heart disease Mother        deceased, unknown cause   Stroke Mother    Obesity Mother    High Cholesterol Mother    Sudden death Father    Diabetes Sister    Hypertension Sister    Esophageal cancer Sister     Stomach cancer Sister    Cancer Sister    Diabetes Sister    Parkinson's disease Brother    Rectal cancer Neg Hx    Colon cancer Neg Hx    Colon polyps Neg Hx     Social History   Socioeconomic History   Marital status: Single    Spouse name: Not on file   Number of children: 0   Years of education: college   Highest education level: Associate degree: occupational, Scientist, product/process development, or vocational program  Occupational History   Occupation: LPN    Employer: [employer]  Tobacco Use   Smoking status: Never    Passive exposure: Never   Smokeless tobacco: Never  Vaping Use   Vaping status: Never Used  Substance and Sexual Activity   Alcohol use: No    Comment: occasional   Drug use: No   Sexual activity: Yes    Birth control/protection: None    Comment: 1st intercourse- 17, partners- 5  Other Topics Concern   Not on file  Social History Narrative   Regular exercise:  No   Caffeine Use: 1 cup coffee daily   No children   Happily divorced   Reports that she is involved at her church   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  Had a sister up in [place]  who passed away 2015/09/10 who she was close to.   Born in [place]- she came to [place]  as adult.  Age 62.    Left-handed.         Social Drivers of Health   Financial Resource Strain: Medium Risk (01/25/2023)   Overall Financial Resource Strain (CARDIA)    Difficulty of Paying Living Expenses: Somewhat hard  Food Insecurity: No Food Insecurity (01/25/2023)   Hunger Vital Sign    Worried About Running Out of Food in the Last Year: Never true    Ran Out of Food in the Last Year: Never true  Transportation Needs: No Transportation Needs (01/25/2023)   PRAPARE - Administrator, Civil Service (Medical): No    Lack of Transportation (Non-Medical): No  Physical Activity: Inactive (01/25/2023)   Exercise Vital Sign    Days of Exercise per Week: 0 days    Minutes of Exercise per Session: 20 min  Stress: No Stress  Concern Present (01/25/2023)   [employer] of Occupational Health - Occupational Stress Questionnaire    Feeling of Stress : Not at all  Social Connections: Moderately Integrated (01/25/2023)   Social Connection and Isolation Panel    Frequency of Communication with Friends and Family: More than three times a week    Frequency of Social Gatherings with Friends and Family: Once a week  Attends Religious Services: More than 4 times per year    Active Member of Clubs or Organizations: Yes    Attends Banker Meetings: More than 4 times per year    Marital Status: Divorced  Intimate Partner Violence: Unknown (04/04/2021)   Received from [facility]   HITS    Physically Hurt: Not on file    Insult or Talk Down To: Not on file    Threaten Physical Harm: Not on file    Scream or Curse: Not on file    Outpatient Medications Prior to Visit  Medication Sig Dispense Refill   albuterol  (VENTOLIN  HFA) 108 (90 Base) MCG/ACT inhaler Inhale 2 puffs into the lungs every 6 (six) hours as needed. 18 g 0   Aspirin  81 MG CAPS 81 mg.     benzonatate  (TESSALON ) 100 MG capsule Take 1 capsule (100 mg total) by mouth every 8 (eight) hours. 20 capsule 0   Blood Glucose Monitoring Suppl DEVI 1 each by Does not apply route in the morning, at noon, and at bedtime. May substitute to any manufacturer covered by patient's insurance. 1 each 0   brimonidine-timolol (COMBIGAN) 0.2-0.5 % ophthalmic solution      calcium -vitamin D  (OSCAL WITH D) 500-200 MG-UNIT TABS tablet Take by mouth.     cyanocobalamin  (VITAMIN B12) 1000 MCG/ML injection 1000mcg IM weekly x 4 weeks then once monthly 12 mL 0   diltiazem  (CARTIA  XT) 240 MG 24 hr capsule Take 1 capsule (240 mg total) by mouth daily. 90 capsule 1   fluticasone  (FLONASE ) 50 MCG/ACT nasal spray Place 2 sprays into both nostrils daily. (Patient taking differently: Place 2 sprays into both nostrils as needed.) 16 g 1   Fremanezumab -vfrm (AJOVY ) 225 MG/1.5ML  SOAJ Inject 225 mg into the skin every 30 (thirty) days. 1.68 mL 11   hydrochlorothiazide  10 mg/mL SUSP daily.     ibuprofen  (ADVIL ) 200 MG tablet Take 200 mg by mouth every 6 (six) hours as needed.     Iron , Ferrous Sulfate , 325 (65 Fe) MG TABS Take 325 mg by mouth 2 (two) times daily. 30 tablet    Lancets (ONETOUCH DELICA PLUS LANCET33G) MISC Apply 1 each topically daily.     meloxicam  (MOBIC ) 15 MG tablet Take 15 mg by mouth daily.     ORENCIA  CLICKJECT 125 MG/ML SOAJ INJECT 125 MG UNDER THE SKIN EVERY 7 DAYS 4 mL 1   rosuvastatin  (CRESTOR ) 10 MG tablet Take 1 tablet (10 mg total) by mouth daily. 90 tablet 1   SUMAtriptan  (IMITREX ) 100 MG tablet Take 1 tablet (100 mg total) by mouth as needed for migraine. May repeat in 2 hours if headache persists or recurs. 30 tablet 5   Syringe/Needle, Disp, (SYRINGE 3CC/22GX1-1/2) 22G X 1-1/2 3 ML MISC Use as directed 50 each 0   timolol (TIMOPTIC) 0.5 % ophthalmic solution 1 drop every morning.     tirzepatide  (MOUNJARO ) 7.5 MG/0.5ML Pen Inject 7.5 mg into the skin once a week. 2 mL 1   TRAVATAN Z 0.004 % SOLN ophthalmic solution Place 1 drop into both eyes at bedtime.      triamterene -hydrochlorothiazide  (MAXZIDE-25) 37.5-25 MG tablet Take 1 tablet by mouth daily. 90 tablet 1   valsartan  (DIOVAN ) 320 MG tablet Take 1 tablet (320 mg total) by mouth daily. 90 tablet 1   Vitamin D , Ergocalciferol , (DRISDOL ) 1.25 MG (50000 UNIT) CAPS capsule Take 1 capsule (50,000 Units total) by mouth every 7 (seven) days. 12.85 capsule 0  carvedilol  (COREG ) 25 MG tablet Take 1 tablet (25 mg total) by mouth 2 (two) times daily with a meal. 60 tablet 5   traMADol  (ULTRAM ) 50 MG tablet Take 50 mg by mouth every 8 (eight) hours as needed.     No facility-administered medications prior to visit.    Allergies  Allergen Reactions   Gabapentin      Made her crazy    ROS See HPI    Objective:    Physical Exam Constitutional:      General: She is not in acute  distress.    Appearance: Normal appearance. She is well-developed.  HENT:     Head: Normocephalic and atraumatic.     Right Ear: External ear normal.     Left Ear: External ear normal.  Eyes:     General: No scleral icterus. Neck:     Thyroid : No thyromegaly.  Cardiovascular:     Rate and Rhythm: Normal rate and regular rhythm.     Heart sounds: Normal heart sounds. No murmur heard. Pulmonary:     Effort: Pulmonary effort is normal. No respiratory distress.     Breath sounds: Normal breath sounds. No wheezing.  Musculoskeletal:     Cervical back: Neck supple.  Skin:    General: Skin is warm and dry.  Neurological:     Mental Status: She is alert and oriented to person, place, and time.  Psychiatric:        Mood and Affect: Mood normal.        Behavior: Behavior normal.        Thought Content: Thought content normal.        Judgment: Judgment normal.      BP (!) 80/52 (BP Location: Left Arm, Patient Position: Sitting, Cuff Size: Large)   Pulse 92   Temp 98 F (36.7 C) (Oral)   Resp 16   Ht 5' (1.524 m)   Wt 191 lb (86.6 kg)   LMP 08/09/2016   SpO2 99%   BMI 37.30 kg/m  Wt Readings from Last 3 Encounters:  08/06/23 191 lb (86.6 kg)  07/31/23 190 lb (86.2 kg)  07/01/23 199 lb (90.3 kg)       Assessment & Plan:   Problem List Items Addressed This Visit       Unprioritized   Vitamin D  insufficiency   Maintained on supplement per healthy weight and wellness.      Rheumatoid arthritis (HCC)   She is now back on Orencia .  She reports feeling better since resuming.       Peripheral neuropathy   Unable to tolerate gabapentin  during the day. Will trial gabapentin  100mg  at bedtime.      OSA (obstructive sleep apnea)   Stable, tolerating new mask.       Obesity, Class II, BMI 35-39.9   She is working hard with Weight loss clinic and has lost 25 pounds.       Migraine   Much better on Ajovy , management per Neuro.       Hyperlipidemia   Lab Results   Component Value Date   CHOL 120 12/05/2022   HDL 54 12/05/2022   LDLCALC 49 12/05/2022   TRIG 85 12/05/2022   CHOLHDL 3 07/17/2022   At goal on Crestor  10mg  once daily.      Glaucoma   Stable- following with Dr Abigail.       GERD (gastroesophageal reflux disease)   Stable without medication.       Essential hypertension -  Primary   Overtreated. Decrease carvedilol  to 12.5mg .       Controlled type 2 diabetes mellitus without complication, without long-term current use of insulin  [facility])   Lab Results  Component Value Date   HGBA1C 6.4 04/26/2023   HGBA1C 6.4 01/25/2023   HGBA1C 6.7 (H) 10/17/2022   Lab Results  Component Value Date   MICROALBUR 3.20 (H) 11/07/2011   LDLCALC 49 12/05/2022   CREATININE 0.72 06/24/2023         Chronic diastolic CHF (congestive heart failure) (HCC)   Clinically euvolemic. Monitor.       CAD (coronary artery disease)   Med management with carvedilol , aspirin , statin. Stable.       B12 deficiency   Continues b12 injections.      Asthma   Stable. Has albuterol  on hand for PRN use.      Anemia   Lab Results  Component Value Date   WBC 4.0 06/24/2023   HGB 13.1 06/24/2023   HCT 42.1 06/24/2023   MCV 90.1 06/24/2023   PLT 243 06/24/2023  Resolved.         I have discontinued Amsi D. Hammett's carvedilol . I am also having her maintain her Travatan Z, calcium -vitamin D , Iron  (Ferrous Sulfate ), fluticasone , albuterol , SUMAtriptan , diltiazem , ibuprofen , timolol, triamterene -hydrochlorothiazide , brimonidine-timolol, Ajovy , cyanocobalamin , SYRINGE 3CC/22GX1-1/2, valsartan , benzonatate , Aspirin , traMADol , hydrochlorothiazide , meloxicam , rosuvastatin , Vitamin D  (Ergocalciferol ), Blood Glucose Monitoring Suppl, OneTouch Delica Plus Lancet33G, Orencia  ClickJect, and tirzepatide .  No orders of the defined types were placed in this encounter.

## 2023-08-06 NOTE — Assessment & Plan Note (Signed)
 Stable- following with Dr Abigail.

## 2023-08-06 NOTE — Assessment & Plan Note (Signed)
Clinically euvolemic.  Monitor.  

## 2023-08-06 NOTE — Assessment & Plan Note (Signed)
 Much better on Ajovy , management per Neuro.

## 2023-08-06 NOTE — Assessment & Plan Note (Signed)
 Unable to tolerate gabapentin  during the day. Will trial gabapentin  100mg  at bedtime.

## 2023-08-06 NOTE — Assessment & Plan Note (Signed)
 Overtreated. Decrease carvedilol  to 12.5mg .

## 2023-08-06 NOTE — Assessment & Plan Note (Signed)
 Lab Results  Component Value Date   HGBA1C 6.4 04/26/2023   HGBA1C 6.4 01/25/2023   HGBA1C 6.7 (H) 10/17/2022   Lab Results  Component Value Date   MICROALBUR 3.20 (H) 11/07/2011   LDLCALC 49 12/05/2022   CREATININE 0.72 06/24/2023

## 2023-08-06 NOTE — Assessment & Plan Note (Signed)
 Stable, tolerating new mask.

## 2023-08-06 NOTE — Assessment & Plan Note (Signed)
 She is now back on Orencia .  She reports feeling better since resuming.

## 2023-08-06 NOTE — Assessment & Plan Note (Signed)
 Med management with carvedilol , aspirin , statin. Stable.

## 2023-08-06 NOTE — Assessment & Plan Note (Signed)
 Lab Results  Component Value Date   WBC 4.0 06/24/2023   HGB 13.1 06/24/2023   HCT 42.1 06/24/2023   MCV 90.1 06/24/2023   PLT 243 06/24/2023  Resolved.

## 2023-08-07 NOTE — Telephone Encounter (Signed)
Patient notified of results and provider's recommendations.  

## 2023-08-23 ENCOUNTER — Other Ambulatory Visit: Payer: Self-pay | Admitting: Physician Assistant

## 2023-08-23 DIAGNOSIS — M0579 Rheumatoid arthritis with rheumatoid factor of multiple sites without organ or systems involvement: Secondary | ICD-10-CM

## 2023-08-23 DIAGNOSIS — Z79899 Other long term (current) drug therapy: Secondary | ICD-10-CM

## 2023-08-23 NOTE — Telephone Encounter (Signed)
 Last Fill: 07/01/2023 (2 month supply)  Labs: 06/24/2023 CMP WNL. Absolute neutrophils are borderline low. Total WBC count is WNL   TB Gold: 03/25/2023 Neg    Next Visit: 09/24/2023  Last Visit: 06/24/2023  IK:Myzlfjunpi arthritis involving multiple sites with positive rheumatoid factor   Current Dose per office note 06/24/2023: Orencia  125 mg subcutaneous every 10 days   Taking every 10 days, written every 7 due to insurance reasons.   Okay to refill Orencia ?

## 2023-08-27 ENCOUNTER — Encounter (INDEPENDENT_AMBULATORY_CARE_PROVIDER_SITE_OTHER): Payer: Self-pay

## 2023-08-27 ENCOUNTER — Ambulatory Visit (INDEPENDENT_AMBULATORY_CARE_PROVIDER_SITE_OTHER): Admitting: Family Medicine

## 2023-08-27 DIAGNOSIS — Z91199 Patient's noncompliance with other medical treatment and regimen due to unspecified reason: Secondary | ICD-10-CM

## 2023-08-27 NOTE — Progress Notes (Signed)
 Did not show for appt, no correspondence with pt

## 2023-08-28 ENCOUNTER — Encounter (INDEPENDENT_AMBULATORY_CARE_PROVIDER_SITE_OTHER): Payer: Self-pay | Admitting: Family Medicine

## 2023-09-05 ENCOUNTER — Ambulatory Visit (INDEPENDENT_AMBULATORY_CARE_PROVIDER_SITE_OTHER): Admitting: Nurse Practitioner

## 2023-09-05 ENCOUNTER — Encounter (INDEPENDENT_AMBULATORY_CARE_PROVIDER_SITE_OTHER): Payer: Self-pay | Admitting: Nurse Practitioner

## 2023-09-05 VITALS — BP 101/70 | HR 86 | Temp 98.6°F | Ht 60.0 in | Wt 182.0 lb

## 2023-09-05 DIAGNOSIS — E559 Vitamin D deficiency, unspecified: Secondary | ICD-10-CM

## 2023-09-05 DIAGNOSIS — E1122 Type 2 diabetes mellitus with diabetic chronic kidney disease: Secondary | ICD-10-CM

## 2023-09-05 DIAGNOSIS — N1832 Chronic kidney disease, stage 3b: Secondary | ICD-10-CM

## 2023-09-05 DIAGNOSIS — E785 Hyperlipidemia, unspecified: Secondary | ICD-10-CM | POA: Diagnosis not present

## 2023-09-05 DIAGNOSIS — E66812 Obesity, class 2: Secondary | ICD-10-CM

## 2023-09-05 DIAGNOSIS — E1169 Type 2 diabetes mellitus with other specified complication: Secondary | ICD-10-CM

## 2023-09-05 DIAGNOSIS — I152 Hypertension secondary to endocrine disorders: Secondary | ICD-10-CM

## 2023-09-05 DIAGNOSIS — E1159 Type 2 diabetes mellitus with other circulatory complications: Secondary | ICD-10-CM | POA: Diagnosis not present

## 2023-09-05 DIAGNOSIS — Z7985 Long-term (current) use of injectable non-insulin antidiabetic drugs: Secondary | ICD-10-CM

## 2023-09-05 DIAGNOSIS — Z6838 Body mass index (BMI) 38.0-38.9, adult: Secondary | ICD-10-CM

## 2023-09-05 MED ORDER — TIRZEPATIDE 10 MG/0.5ML ~~LOC~~ SOAJ
10.0000 mg | SUBCUTANEOUS | 0 refills | Status: DC
Start: 2023-09-05 — End: 2023-10-14

## 2023-09-05 MED ORDER — VITAMIN D (ERGOCALCIFEROL) 1.25 MG (50000 UNIT) PO CAPS: 50000.0000 [IU] | ORAL_CAPSULE | ORAL | 0 refills | Status: DC

## 2023-09-05 NOTE — Progress Notes (Signed)
 Office: [phone]  /  Fax: [phone]  WEIGHT SUMMARY AND BIOMETRICS  Weight Lost Since Last Visit: 8 lb  Weight Gained Since Last Visit: 0   Vitals Temp: 98.6 F (37 C) BP: 101/70 Pulse Rate: 86 SpO2: 99 %   Anthropometric Measurements Height: 5' (1.524 m) Weight: 182 lb (82.6 kg) BMI (Calculated): 35.54 Weight at Last Visit: 190 lb Weight Lost Since Last Visit: 8 lb Weight Gained Since Last Visit: 0 Starting Weight: 214 lb Total Weight Loss (lbs): 32 lb (14.5 kg) Peak Weight: 218 lb   Body Composition  Body Fat %: 47.3 % Fat Mass (lbs): 86 lbs Muscle Mass (lbs): 91.2 lbs Total Body Water (lbs): 69.6 lbs Visceral Fat Rating : 14   Other Clinical Data Fasting: no Labs: no Today's Visit #: 8 Starting Date: 12/05/22 Comments: reck'd height today    Total Weight Loss: 32 pounds Percent of body weight lost:14.9%   HPI  Chief Complaint: OBESITY  Amber [name] is here to discuss her progress with her obesity treatment plan. She is on the the Category 2 Plan and states she is following her eating plan approximately 60 % of the time. She states she is exercising 15-20 minutes 3 days per week.   Interval History:  Since last office visit she has been trying to drink more water throughout the day- getting 72 ounces of water a day.  She has not been checking her blood sugars. Denies symptoms of hypoglycemia such as dizziness, sweating impaired cognition.  Diet is mostly seafood and chicken, eating more fruits and vegetables. Mounjaro  has not been controlling her cravings and she is interested in increasing her dose.  Denies constipation, nausea, change in voice and difficulty swallowing.   She has a history of diabetes with associated Stage 3b CKD, hypertension, hyperlipidemia and obesity. BP is currently well controlled with carvedilol  12.5 mg BID, Maxzide 37.5/25 mg every day and valsartan  320 mg every day. Valsartan  also used for kidney protection. She is  currently on Mounjaro  7.5 mg for diabetes. She takes Rosuvastatin  10 mg every day for hyperlipidemia.  BP Readings from Last 3 Encounters:  09/05/23 101/70  08/06/23 (!) 80/52  07/31/23 112/75   She also has Vit D def and is currently taking Ergocalciferol  50000 units once a week. Last Vit D 08/06/23 was 34.49  Pharmacotherapy for weight loss: She is currently taking Mounjaro  for diabetes and off label for medical weight loss.  Denies side effects.       PHYSICAL EXAM:  Blood pressure 101/70, pulse 86, temperature 98.6 F (37 C), height 5' (1.524 m), weight 182 lb (82.6 kg), last menstrual period 08/09/2016, SpO2 99%. Body mass index is 35.54 kg/m.  General: She is overweight, cooperative, alert, well developed, and in no acute distress. PSYCH: Has normal mood, affect and thought process.   Extremities: No edema.  Neurologic: No gross sensory or motor deficits. No tremors or fasciculations noted.    DIAGNOSTIC DATA REVIEWED:  BMET    Component Value Date/Time   NA 140 08/06/2023 1024   NA 137 12/05/2022 0942   NA 138 01/21/2015 0935   K 4.2 08/06/2023 1024   K 3.9 01/21/2015 0935   CL 104 08/06/2023 1024   CL 108 (H) 06/25/2012 1508   CO2 30 08/06/2023 1024   CO2 25 01/21/2015 0935   GLUCOSE 106 (H) 08/06/2023 1024   GLUCOSE 128 01/21/2015 0935   GLUCOSE 107 (H) 06/25/2012 1508   BUN 12 08/06/2023 1024   BUN 12  12/05/2022 0942   BUN 7.0 01/21/2015 0935   CREATININE 1.30 (H) 08/06/2023 1024   CREATININE 0.72 06/24/2023 0827   CREATININE 0.8 01/21/2015 0935   CALCIUM  9.7 08/06/2023 1024   CALCIUM  9.4 01/21/2015 0935   GFRNONAA 38 (L) 09/25/2022 2132   GFRNONAA 99 02/19/2020 1339   GFRAA 115 02/19/2020 1339   Lab Results  Component Value Date   HGBA1C 6.1 08/06/2023   HGBA1C 6.0 (H) 09/15/2010   Lab Results  Component Value Date   INSULIN  17.5 12/05/2022   INSULIN  16.5 09/04/2017   Lab Results  Component Value Date   TSH 1.560 12/05/2022   CBC     Component Value Date/Time   WBC 4.0 06/24/2023 0827   RBC 4.67 06/24/2023 0827   HGB 13.1 06/24/2023 0827   HGB 13.8 10/27/2018 1145   HGB 12.4 09/13/2016 0935   HCT 42.1 06/24/2023 0827   HCT 40.0 12/05/2022 0942   HCT 39.6 09/13/2016 0935   PLT 243 06/24/2023 0827   PLT 208 10/03/2018 1512   PLT 194 09/13/2016 0935   MCV 90.1 06/24/2023 0827   MCV 87.8 09/13/2016 0935   MCH 28.1 06/24/2023 0827   MCHC 31.1 (L) 06/24/2023 0827   RDW 13.4 06/24/2023 0827   RDW 14.4 09/13/2016 0935   Iron  Studies    Component Value Date/Time   IRON  92 12/05/2022 0942   IRON  79 09/13/2016 0935   TIBC 303 12/05/2022 0942   TIBC 326 09/13/2016 0935   FERRITIN 120 12/05/2022 0942   FERRITIN 40 09/13/2016 0935   IRONPCTSAT 30 12/05/2022 0942   IRONPCTSAT 29 07/17/2022 0837   Lipid Panel     Component Value Date/Time   CHOL 120 12/05/2022 0942   TRIG 85 12/05/2022 0942   HDL 54 12/05/2022 0942   CHOLHDL 3 07/17/2022 0837   VLDL 19.6 07/17/2022 0837   LDLCALC 49 12/05/2022 0942   LDLCALC 73 09/21/2019 1137   Hepatic Function Panel     Component Value Date/Time   PROT 7.8 06/24/2023 0827   PROT 8.8 (H) 12/05/2022 0942   PROT 9.2 (H) 01/21/2015 0935   ALBUMIN 4.1 04/26/2023 1119   ALBUMIN 4.3 12/05/2022 0942   ALBUMIN 3.5 01/21/2015 0935   AST 16 06/24/2023 0827   AST 17 01/21/2015 0935   ALT 21 06/24/2023 0827   ALT 15 01/21/2015 0935   ALKPHOS 56 04/26/2023 1119   ALKPHOS 54 01/21/2015 0935   BILITOT 0.6 06/24/2023 0827   BILITOT 0.8 12/05/2022 0942   BILITOT 0.55 01/21/2015 0935   BILIDIR 0.2 09/10/2018 1040      Component Value Date/Time   TSH 1.560 12/05/2022 0942   Nutritional Lab Results  Component Value Date   VD25OH 34.49 08/06/2023   VD25OH 29.4 (L) 12/05/2022   VD25OH 37.2 09/04/2017     ASSESSMENT AND PLAN  TREATMENT PLAN FOR OBESITY:  Recommended Dietary Goals  Amber [name] is currently in the action stage of change. As such, her goal is to continue  weight management plan. She has agreed to the Category 2 Plan.  Behavioral Intervention  We discussed the following Behavioral Modification Strategies today: increasing lower glycemic fruits, increasing water intake , continue to work on maintaining a reduced calorie state, getting the recommended amount of protein, incorporating whole foods, making healthy choices, staying well hydrated and practicing mindfulness when eating., and increase protein intake, fibrous foods (25 grams per day for women, 30 grams for men) and water to improve satiety and decrease hunger signals. SABRA  Additional resources provided today: NA  Recommended Physical Activity Goals  Gaylen has been advised to work up to 150 minutes of moderate intensity aerobic activity a week and strengthening exercises 2-3 times per week for cardiovascular health, weight loss maintenance and preservation of muscle mass.   She has agreed to Think about enjoyable ways to increase daily physical activity and overcoming barriers to exercise, Increase physical activity in their day and reduce sedentary time (increase NEAT)., and Increase aerobic activity with a goal of 150 minutes a week at moderate intensity.    Pharmacotherapy We discussed various medication options to help Aditri with her weight loss efforts and we both agreed to increase Mounjaro  dose to 10 mg SQ QW.  ASSOCIATED CONDITIONS ADDRESSED TODAY  Action/Plan  Hyperlipidemia associated with type 2 diabetes mellitus (HCC) Continue Rosuvastatin  10 mg every day Limit saturated fats in her diet Continue to follow category 2 meal plan and increase aerobic activity to 150 minutes/ week  Hypertension associated with type 2 diabetes mellitus (HCC) Continue carvedilol  12.5 mg BID, Maxzide 37.5/25 mg every day and valsartan  320 mg every day.  Will continue to monitor BP, if continues with weight loss and BP remains low will discuss decreasing carvedilol . Does not see PCP  until 12/2023 Denies dizziness, visual changes and chest pain   Type 2 diabetes mellitus with stage 3b chronic kidney disease, without long-term current use of insulin  (HCC) Continue to push water throughout the day.  Continue valsartan  for BP and kidney protection.   Vitamin D  insufficiency Continue Ergocalciferol  1 cap QW and recheck in 3 months -     Vitamin D  (Ergocalciferol ); Take 1 capsule (50,000 Units total) by mouth every 7 (seven) days.  Dispense: 13 capsule; Refill: 0  Type 2 diabetes mellitus with obesity (HCC) Increase Mounjaro  to 10 mg SQ QW Limit simple carbs in diet Continue with Category 2 meal plan and increase aerobic activity to 150 minutes/ week  -     Tirzepatide ; Inject 10 mg into the skin once a week.  Dispense: 6 mL; Refill: 0  Class 2 severe obesity with serious comorbidity and body mass index (BMI) of 38.0 to 38.9 in adult, unspecified obesity type (HCC) Increase Mounjaro  to 10 mg SQ QW Limit simple carbs and saturated fats in diet Continue with Category 2 meal plan and increase aerobic activity to 150 minutes/ week         Return in about 4 weeks (around 10/03/2023).SABRA She was informed of the importance of frequent follow up visits to maximize her success with intensive lifestyle modifications for her multiple health conditions.   ATTESTASTION STATEMENTS:  Reviewed by clinician on day of visit: allergies, medications, problem list, medical history, surgical history, family history, social history, and previous encounter notes.     Timouthy Gilardi ANP-C

## 2023-09-10 NOTE — Progress Notes (Unsigned)
 Office Visit Note  Patient: Amber [name]             Date of Birth: 06-18-1960           MRN: [id number]             PCP: Daryl Setter, NP Referring: Daryl Setter, NP Visit Date: 09/24/2023 Occupation: @GUAROCC @  Subjective:  Medication monitoring   History of Present Illness: Amber [name] is a 63 y.o. female with history of seropositive rheumatoid arthritis and sjogren's syndrome. Patient remains on Orencia  125 mg subcutaneous every 7 days.  She is tolerating Orencia  without any side effects and has not had any gaps in therapy.  Patient has noticed an improvement in her symptoms since increasing the frequency of Orencia  from every 10 days to every 7 days dosing.  She is not experiencing any increased pain or swelling in her hands at this time.  She continues to have intermittent discomfort in the left shoulder, left hip, and right knee.  Patient states that she completed physical therapy at the end of July 2025 for the right knee.  She has been taking Aleve as needed for symptomatic relief. She has been using a heating pad on the left shoulder as needed.   She continues to have chronic sicca symptoms.  She has been using over-the-counter products for symptomatic relief.  She uses Systane eyedrops for dry eyes.  She denies any shortness of breath or cough. She denies any recent or recurrent infections.    Activities of Daily Living:  Patient reports morning stiffness for 5-10 minutes.   Patient Reports nocturnal pain.  Difficulty dressing/grooming: Denies Difficulty climbing stairs: Reports Difficulty getting out of chair: Reports Difficulty using hands for taps, buttons, cutlery, and/or writing: Denies  Review of Systems  Constitutional:  Positive for fatigue.  HENT:  Positive for mouth dryness. Negative for mouth sores.   Eyes:  Positive for dryness.  Respiratory:  Negative for shortness of breath.   Cardiovascular:  Negative for chest pain and  palpitations.  Gastrointestinal:  Negative for blood in stool, constipation and diarrhea.  Endocrine: Negative for increased urination.  Genitourinary:  Negative for involuntary urination.  Musculoskeletal:  Positive for joint pain, joint pain, joint swelling, myalgias, morning stiffness, muscle tenderness and myalgias. Negative for gait problem and muscle weakness.  Skin:  Negative for color change, rash, hair loss and sensitivity to sunlight.  Allergic/Immunologic: Negative for susceptible to infections.  Neurological:  Positive for headaches. Negative for dizziness.  Hematological:  Negative for swollen glands.  Psychiatric/Behavioral:  Negative for depressed mood and sleep disturbance. The patient is not nervous/anxious.     PMFS History:  Patient Active Problem List   Diagnosis Date Noted   Hypertension associated with diabetes (HCC) 07/01/2023   OSA (obstructive sleep apnea) 01/25/2023   Vitamin D  insufficiency 12/10/2022   Preventative health care 07/17/2022   Peripheral neuropathy 07/17/2022   High risk HPV infection 10/17/2020   Cerebrovascular disease 10/13/2020   Colon polyps 09/26/2020   GERD (gastroesophageal reflux disease) 09/26/2020   B12 deficiency 05/03/2020   Hyperlipidemia 05/03/2020   Asthma 05/03/2020   Diabetic peripheral neuropathy (HCC) 01/05/2020   Class 2 severe obesity with serious comorbidity and body mass index (BMI) of 38.0 to 38.9 in adult, unspecified obesity type 01/05/2020   Adrenal mass, right 01/05/2020   Hypertensive heart disease 09/04/2017   Controlled type 2 diabetes mellitus without complication, without long-term current use of insulin  (HCC) 05/31/2017   Chronic diastolic  CHF (congestive heart failure) (HCC) 04/24/2017   CAD (coronary artery disease) 04/24/2017   History of BCG vaccination 06/14/2016   Migraine 04/19/2014   Elevated serum protein level 04/09/2014   Iron  deficiency anemia 09/02/2013   Obesity, Class II, BMI 35-39.9  10/17/2012   Glaucoma 11/07/2011   Rheumatoid arthritis (HCC) 11/07/2011   Essential hypertension 11/05/2011   Sjogren's disease 11/05/2011   Anemia 09/22/2010   Fibroids 09/22/2010    Past Medical History:  Diagnosis Date   Anemia    Arthritis    Asthma 05/03/2020   Back pain    Blood transfusion without reported diagnosis    CAD (coronary artery disease) 04/24/2017   Cataract    Cerebrovascular disease 10/13/2020   Chronic diastolic CHF (congestive heart failure) (HCC) 04/24/2017   Colon polyps    Controlled type 2 diabetes mellitus without complication, without long-term current use of insulin  (HCC) 05/31/2017   Diabetic peripheral neuropathy (HCC) 01/05/2020   Ectopic pregnancy    RIGHT salpingostomy   Elective abortion    Elective abortion 09/26/2020   RIGHT salpingostomy   Essential hypertension 09/04/2017   GERD (gastroesophageal reflux disease)    Glaucoma    both eyes   Headache    migraines   History of chicken pox    History of chicken pox 09/26/2020   migraines   Hyperlipemia    Hypertension    Iron  deficiency anemia 09/02/2013   Joint pain    Migraine 04/19/2014   Obesity (BMI 30-39.9) 10/17/2012   Palpitations 10/01/2012   Pre-diabetes    Rheumatoid arthritis (HCC) 11/07/2011   Rheumatoid arthritis(714.0) 11/07/2011   SAB (spontaneous abortion)    SAB (spontaneous abortion) 11/07/2011   Shortness of breath    Sjogren's disease    Swelling of lower extremity    Vitamin B12 deficiency    Wheezing     Family History  Problem Relation Age of Onset   Hypertension Mother        died from heart disease   Heart disease Mother        deceased, unknown cause   Stroke Mother    Obesity Mother    High Cholesterol Mother    Sudden death Father    Diabetes Sister    Hypertension Sister    Esophageal cancer Sister    Stomach cancer Sister    Cancer Sister    Diabetes Sister    Parkinson's disease Brother    Rectal cancer Neg Hx    Colon cancer  Neg Hx    Colon polyps Neg Hx    Past Surgical History:  Procedure Laterality Date   ABDOMINAL SURGERY     bowel repair   CATARACT EXTRACTION     02/25/2023, 03/11/2023   COLONOSCOPY     COLONOSCOPY  08/2022   DILATION AND CURETTAGE OF UTERUS     X 2   KNEE ARTHROSCOPY W/ MENISCAL REPAIR Right 01/16/2023   MENISCUS REPAIR Right    MENISCUS TEAR REPAIR   MYOMECTOMY N/A 07/27/2014   Procedure: ABDOMINAL MYOMECTOMY;  Surgeon: Jerolyn Foil, MD;  Location: [facility];  Service: Gynecology;  Laterality: N/A;   PELVIC LAPAROSCOPY  1994   IN 1999 LAPAROSCOPY WITH INCIDENTAL ENTEROTOMY REQUIRING LAPAROTOMY   ROTATOR CUFF REPAIR Left 06/21/2021   UTERINE FIBROID SURGERY     July 2016   Social History   Social History Narrative   Regular exercise:  No   Caffeine Use: 1 cup coffee daily   No children  Happily divorced   Reports that she is involved at her church   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  Had a sister up in [place]  who passed away Oct 29, 2015 who she was close to.   Born in [place]- she came to [place]  as adult.  Age 76.    Left-handed.         Immunization History  Administered Date(s) Administered   Hepatitis A, Adult 11/01/2017, 12/03/2018   IPV 11/01/2017, 05/01/2022   Influenza Split 11/07/2011   Influenza, Seasonal, Injecte, Preservative Fre 09/24/2022   Influenza,inj,Quad PF,6+ Mos 10/17/2012, 01/15/2014, 08/31/2014, 28-Oct-2017, 09/10/2018, 09/21/2019, 10/07/2020, 11/07/2021   Meningococcal Mcv4o 11/01/2017   Moderna Sars-Covid-2 Vaccination 01/28/2019, 02/27/2019, 11/27/2019   Pfizer(Comirnaty)Fall Seasonal Vaccine 12 years and older 10/03/2020, 10/02/2022   Pneumococcal Conjugate-13 08/05/2020   Pneumococcal Polysaccharide-23 09/10/2018   Td 07/17/2022   Tdap 06/01/2011, 11/25/2012   Typhoid Live 11/01/2017   Zoster Recombinant(Shingrix ) 10/04/2017, 12/04/2017     Objective: Vital Signs: BP 135/89   Pulse 79   Temp 98 F (36.7 C)   Resp 14    Ht 5' 1 (1.549 m)   Wt 180 lb 6.4 oz (81.8 kg)   LMP 08/09/2016   BMI 34.09 kg/m    Physical Exam Vitals and nursing note reviewed.  Constitutional:      Appearance: She is well-developed.  HENT:     Head: Normocephalic and atraumatic.  Eyes:     Conjunctiva/sclera: Conjunctivae normal.  Cardiovascular:     Rate and Rhythm: Normal rate and regular rhythm.     Heart sounds: Normal heart sounds.  Pulmonary:     Effort: Pulmonary effort is normal.     Breath sounds: Normal breath sounds.  Abdominal:     General: Bowel sounds are normal.     Palpations: Abdomen is soft.  Musculoskeletal:     Cervical back: Normal range of motion.  Lymphadenopathy:     Cervical: No cervical adenopathy.  Skin:    General: Skin is warm and dry.     Capillary Refill: Capillary refill takes less than 2 seconds.  Neurological:     Mental Status: She is alert and oriented to person, place, and time.  Psychiatric:        Behavior: Behavior normal.      Musculoskeletal Exam: C-spine has limited range of motion with lateral rotation.  Left trapezius muscle tension and tenderness noted.  Thoracic spine and lumbar spine have good range of motion.  No midline spinal tenderness.  No SI joint tenderness.  Discomfort with ROM of the left shoulder.  elbow joints, wrist joints, MCPs, PIPs, DIPs have good range of motion with no synovitis.  Complete fist formation bilaterally.  Hip joints have good range of motion with no groin pain. Tenderness over the left trochanteric bursa.   Knee joints have good range of motion no warmth or effusion.  Ankle joints have good range of motion no tenderness or joint swelling.     CDAI Exam: CDAI Score: -- Patient Global: --; Provider Global: -- Swollen: --; Tender: -- Joint Exam 09/24/2023   No joint exam has been documented for this visit   There is currently no information documented on the homunculus. Go to the Rheumatology activity and complete the homunculus joint  exam.  Investigation: No additional findings.  Imaging: No results found.  Recent Labs: Lab Results  Component Value Date   WBC 4.0 06/24/2023   HGB 13.1 06/24/2023   PLT 243 06/24/2023  NA 140 08/06/2023   K 4.2 08/06/2023   CL 104 08/06/2023   CO2 30 08/06/2023   GLUCOSE 106 (H) 08/06/2023   BUN 12 08/06/2023   CREATININE 1.30 (H) 08/06/2023   BILITOT 0.6 06/24/2023   ALKPHOS 56 04/26/2023   AST 16 06/24/2023   ALT 21 06/24/2023   PROT 7.8 06/24/2023   ALBUMIN 4.1 04/26/2023   CALCIUM  9.7 08/06/2023   GFRAA 115 02/19/2020   QFTBGOLD Negative 06/14/2016   QFTBGOLDPLUS NEGATIVE 03/25/2023    Speciality Comments: PLQ eye exam: 12/09/2018 normal. [facility].  Procedures:  No procedures performed Allergies: Gabapentin       Assessment / Plan:     Visit Diagnoses: Rheumatoid arthritis involving multiple sites with positive rheumatoid factor (HCC) - Positive RF, negative anti-CCP, -14 3 3  ETA, diagnosed 2010 by Dr. Vivia, later Dr. Mai: She has no synovitis on examination today.  She is clinically doing well on Orencia  125 mg sq injections once weekly.  She has noticed clinical benefit since increasing the frequency of Orencia  dosing from every 10 days to every 7 days.  She is tolerating orencia  without any side effects or gaps in therapy.  No recent or recurrent infections.  She continues to experience intermittent arthralgias but had no active inflammation on examination today.  No medication changes will be made at this time.  Plan to update CBC with differential and CMP with GFR today to monitor for drug toxicity.  She will notify us  if she develops any new or worsening symptoms. She will follow up in the office in 5 months or sooner if needed.   - Plan: Sedimentation rate  High risk medication use - Orencia  125 mg subcutaneous every 7 days. Initially started on Orencia  01/28/20. (methotrexate  was discontinued due to neutropenia).  CBC and CMP updated on  06/24/23.  Orders for CBC and CMP released today.  TB gold negative on 03/25/23 Lipid panel updated on 12/05/22.  No recent or recurrent infections. Discussed the importance of holding orencia  if she develops signs or symptoms of an infection and to resume once the infection has completely cleared.   - Plan: CBC with Differential/Platelet, Comprehensive metabolic panel with GFR  Sjogren's syndrome with other organ involvement - Ro+, La+: She continues to have chronic sicca symptoms and has been using over-the-counter products for symptomatic relief.  Discussed that she may benefit from using a humidifier.  Discussed the risk for lymphoma patients with Sjogren syndrome.  Also discussed increased risk for developing interstitial lung disease in patient with Sjogren syndrome.  She has not developed any new or worsening pulmonary symptoms.  Her lungs were clear to auscultation today.   Plan to check the following lab work today.  She will notify us  if she develops any new or worsening symptoms.  Plan: Sedimentation rate, C3 and C4, Serum protein electrophoresis with reflex, ANA, Sjogrens syndrome-A extractable nuclear antibody, Sjogrens syndrome-B extractable nuclear antibody  S/P rotator cuff repair left - June 21, 2021: Intermittent discomfort.  She has intermittent discomfort in the left shoulder.  On examination today she has left trapezius muscle tension and tenderness.  A Salonpas patch was applied today while in the office and she was given 2 samples to use at home.  She has been using a heating pad as needed.  Primary osteoarthritis of both knees - Right knee 01/26/2023: Completed PT at the end of July 2025.  She continues to have intermittent discomfort in the right knee.  No effusion noted.  She takes Aleve as needed for symptomatic relief.  Primary osteoarthritis of both feet - Dr. Regal-Intermittent discomfort in the right foot and ankle.  No synovitis noted.   Plantar fasciitis, bilateral: Not  currently symptomatic.   Other medical conditions are listed as follows:   Family history of rheumatoid arthritis  Coronary artery disease involving native coronary artery of native heart without angina pectoris  Essential hypertension: BP was 135/89 today in the office.   Palpitations  Chronic diastolic CHF (congestive heart failure) (HCC)  Neck pain  Controlled type 2 diabetes mellitus without complication, without long-term current use of insulin  (HCC)  Hx of migraines  Other fatigue  Orders: Orders Placed This Encounter  Procedures   CBC with Differential/Platelet   Comprehensive metabolic panel with GFR   Sedimentation rate   C3 and C4   Serum protein electrophoresis with reflex   ANA   Sjogrens syndrome-A extractable nuclear antibody   Sjogrens syndrome-B extractable nuclear antibody   No orders of the defined types were placed in this encounter.    Follow-Up Instructions: Return in about 5 months (around 02/24/2024) for Rheumatoid arthritis, Sjogren's syndrome.   Waddell CHRISTELLA Craze, PA-C  Note - This record has been created using Dragon software.  Chart creation errors have been sought, but may not always  have been located. Such creation errors do not reflect on  the standard of medical care.

## 2023-09-16 ENCOUNTER — Ambulatory Visit (INDEPENDENT_AMBULATORY_CARE_PROVIDER_SITE_OTHER): Admitting: Family Medicine

## 2023-09-24 ENCOUNTER — Ambulatory Visit: Attending: Physician Assistant | Admitting: Physician Assistant

## 2023-09-24 ENCOUNTER — Encounter: Payer: Self-pay | Admitting: Physician Assistant

## 2023-09-24 VITALS — BP 135/89 | HR 79 | Temp 98.0°F | Resp 14 | Ht 61.0 in | Wt 180.4 lb

## 2023-09-24 DIAGNOSIS — Z79899 Other long term (current) drug therapy: Secondary | ICD-10-CM

## 2023-09-24 DIAGNOSIS — I2510 Atherosclerotic heart disease of native coronary artery without angina pectoris: Secondary | ICD-10-CM

## 2023-09-24 DIAGNOSIS — I10 Essential (primary) hypertension: Secondary | ICD-10-CM

## 2023-09-24 DIAGNOSIS — Z9889 Other specified postprocedural states: Secondary | ICD-10-CM

## 2023-09-24 DIAGNOSIS — M722 Plantar fascial fibromatosis: Secondary | ICD-10-CM

## 2023-09-24 DIAGNOSIS — Z8261 Family history of arthritis: Secondary | ICD-10-CM

## 2023-09-24 DIAGNOSIS — Z8669 Personal history of other diseases of the nervous system and sense organs: Secondary | ICD-10-CM

## 2023-09-24 DIAGNOSIS — R002 Palpitations: Secondary | ICD-10-CM

## 2023-09-24 DIAGNOSIS — M0579 Rheumatoid arthritis with rheumatoid factor of multiple sites without organ or systems involvement: Secondary | ICD-10-CM | POA: Diagnosis not present

## 2023-09-24 DIAGNOSIS — M19071 Primary osteoarthritis, right ankle and foot: Secondary | ICD-10-CM

## 2023-09-24 DIAGNOSIS — M19072 Primary osteoarthritis, left ankle and foot: Secondary | ICD-10-CM

## 2023-09-24 DIAGNOSIS — E119 Type 2 diabetes mellitus without complications: Secondary | ICD-10-CM

## 2023-09-24 DIAGNOSIS — M170 Bilateral primary osteoarthritis of knee: Secondary | ICD-10-CM

## 2023-09-24 DIAGNOSIS — M3509 Sicca syndrome with other organ involvement: Secondary | ICD-10-CM | POA: Diagnosis not present

## 2023-09-24 DIAGNOSIS — R5383 Other fatigue: Secondary | ICD-10-CM

## 2023-09-24 DIAGNOSIS — M542 Cervicalgia: Secondary | ICD-10-CM

## 2023-09-24 DIAGNOSIS — I5032 Chronic diastolic (congestive) heart failure: Secondary | ICD-10-CM

## 2023-09-25 NOTE — Telephone Encounter (Signed)
 I called pt to inform her that I accidentally put in another pt lab orders in on her chart instead of correct chart for ear culture on 12/11/2019. I have notified [facility] and [facility] for corrections to be made.

## 2023-09-26 ENCOUNTER — Ambulatory Visit: Payer: Self-pay | Admitting: Rheumatology

## 2023-09-26 LAB — CBC WITH DIFFERENTIAL/PLATELET
Absolute Lymphocytes: 1796 {cells}/uL (ref 850–3900)
Absolute Monocytes: 170 {cells}/uL — ABNORMAL LOW (ref 200–950)
Basophils Absolute: 19 {cells}/uL (ref 0–200)
Basophils Relative: 0.7 %
Eosinophils Absolute: 70 {cells}/uL (ref 15–500)
Eosinophils Relative: 2.6 %
HCT: 39.2 % (ref 35.0–45.0)
Hemoglobin: 12.5 g/dL (ref 11.7–15.5)
MCH: 27.7 pg (ref 27.0–33.0)
MCHC: 31.9 g/dL — ABNORMAL LOW (ref 32.0–36.0)
MCV: 86.7 fL (ref 80.0–100.0)
MPV: 10.8 fL (ref 7.5–12.5)
Monocytes Relative: 6.3 %
Neutro Abs: 645 {cells}/uL — ABNORMAL LOW (ref 1500–7800)
Neutrophils Relative %: 23.9 %
Platelets: 212 Thousand/uL (ref 140–400)
RBC: 4.52 Million/uL (ref 3.80–5.10)
RDW: 12.4 % (ref 11.0–15.0)
Total Lymphocyte: 66.5 %
WBC: 2.7 Thousand/uL — ABNORMAL LOW (ref 3.8–10.8)

## 2023-09-26 LAB — ANTI-NUCLEAR AB-TITER (ANA TITER)
ANA TITER: 1:80 {titer} — ABNORMAL HIGH
ANA Titer 1: 1:1280 {titer} — ABNORMAL HIGH

## 2023-09-26 LAB — COMPREHENSIVE METABOLIC PANEL WITH GFR
AG Ratio: 1.1 (calc) (ref 1.0–2.5)
ALT: 22 U/L (ref 6–29)
AST: 21 U/L (ref 10–35)
Albumin: 4.2 g/dL (ref 3.6–5.1)
Alkaline phosphatase (APISO): 44 U/L (ref 37–153)
BUN: 10 mg/dL (ref 7–25)
CO2: 26 mmol/L (ref 20–32)
Calcium: 9.7 mg/dL (ref 8.6–10.4)
Chloride: 107 mmol/L (ref 98–110)
Creat: 0.63 mg/dL (ref 0.50–1.05)
Globulin: 3.7 g/dL (ref 1.9–3.7)
Glucose, Bld: 81 mg/dL (ref 65–99)
Potassium: 3.9 mmol/L (ref 3.5–5.3)
Sodium: 142 mmol/L (ref 135–146)
Total Bilirubin: 0.9 mg/dL (ref 0.2–1.2)
Total Protein: 7.9 g/dL (ref 6.1–8.1)
eGFR: 100 mL/min/1.73m2 (ref >=60)

## 2023-09-26 LAB — PROTEIN ELECTROPHORESIS, SERUM, WITH REFLEX
Albumin ELP: 3.8 g/dL (ref 3.8–4.8)
Alpha 1: 0.3 g/dL (ref 0.2–0.3)
Alpha 2: 0.7 g/dL (ref 0.5–0.9)
Beta 2: 0.6 g/dL — ABNORMAL HIGH (ref 0.2–0.5)
Beta Globulin: 0.4 g/dL (ref 0.4–0.6)
Gamma Globulin: 2.1 g/dL — ABNORMAL HIGH (ref 0.8–1.7)
Total Protein: 8 g/dL (ref 6.1–8.1)

## 2023-09-26 LAB — SJOGRENS SYNDROME-A EXTRACTABLE NUCLEAR ANTIBODY: SSA (Ro) (ENA) Antibody, IgG: >8 AI — AB

## 2023-09-26 LAB — C3 AND C4
C3 Complement: 173 mg/dL (ref 83–193)
C4 Complement: 33 mg/dL (ref 15–57)

## 2023-09-26 LAB — ANA: Anti Nuclear Antibody (ANA): POSITIVE — AB

## 2023-09-26 LAB — SEDIMENTATION RATE: Sed Rate: 39 mm/h — ABNORMAL HIGH (ref 0–30)

## 2023-09-26 LAB — SJOGRENS SYNDROME-B EXTRACTABLE NUCLEAR ANTIBODY: SSB (La) (ENA) Antibody, IgG: >8 AI — AB

## 2023-09-26 NOTE — Progress Notes (Signed)
 White cell count is low secondary to Orencia .  CMP normal.  Sed rate is mildly elevated.  SPEP normal.  ANA remains positive, Sjogren's antibodies (SSA and SSB) remain positive.  Complements normal.  Labs do not indicate an autoimmune disease flare.  If patient is doing well she may space Orencia  every 10 days.  Repeat CBC in 1 month.

## 2023-09-27 ENCOUNTER — Other Ambulatory Visit: Payer: Self-pay | Admitting: *Deleted

## 2023-09-27 DIAGNOSIS — M3509 Sicca syndrome with other organ involvement: Secondary | ICD-10-CM

## 2023-09-27 DIAGNOSIS — Z79899 Other long term (current) drug therapy: Secondary | ICD-10-CM

## 2023-09-27 DIAGNOSIS — M0579 Rheumatoid arthritis with rheumatoid factor of multiple sites without organ or systems involvement: Secondary | ICD-10-CM

## 2023-09-30 NOTE — Telephone Encounter (Signed)
 I left message for pt to inform her that I sent her MRI order to [facility].

## 2023-10-10 ENCOUNTER — Ambulatory Visit: Payer: Self-pay

## 2023-10-10 NOTE — Telephone Encounter (Signed)
 FYI Only or Action Required?: FYI only for provider.  Patient was last seen in primary care on 09/05/2023 by Jude Lonell BRAVO, NP.  Called Nurse Triage reporting Blister.  Symptoms began a week ago.  Interventions attempted: OTC medications: antibiotic ointment.  Symptoms are: gradually worsening.  Triage Disposition: See Physician Within 24 Hours  Patient/caregiver understands and will follow disposition?: Yes- Patient requesting visit on Monday,  says that she works today and tomorrow from 7-7p so she is unable to schedule within 24 hours. RN advising urgent care with next 24 hourse for sooner eval of her symptoms since they are open until 8pm. Pt verbalized understanding  Copied from CRM #[id number]. Topic: Clinical - Red Word Triage >> Oct 10, 2023  2:30 PM Paige D wrote: Red Word that prompted transfer to Nurse Triage: Blisters under the right breast, they are painful when touching and washing them Reason for Disposition  Looks like a boil, infected sore, deep ulcer or other infected rash  Answer Assessment - Initial Assessment Questions 1. APPEARANCE of RASH: What does the rash look like? (e.g., blisters, dry flaky skin, red spots, redness, sores)     Blisters 2. LOCATION: Where is the rash located?      Underneath the right breast fold  3. NUMBER: How many spots are there?      2  4. SIZE: How big are the spots? (e.g., inches, cm; or compare to size of pinhead, tip of pen, eraser, pea)      1/2 the size of quarter  5. ONSET: When did the rash start?      1 week ago  6. ITCHING: Does the rash itch? If Yes, ask: How bad is the itch?  (Scale 0-10; or none, mild, moderate, severe)     No itching or burning,  7. PAIN: Does the rash hurt? If Yes, ask: How bad is the pain?  (Scale 0-10; or none, mild, moderate, severe)     Mild pain when taking a shower and rubbing the area  8. OTHER SYMPTOMS: Do you have any other symptoms? (e.g., fever)     No  9.  PREGNANCY: Is there any chance you are pregnant? When was your last menstrual period?     no  Protocols used: Rash or Redness - Localized-A-AH

## 2023-10-11 ENCOUNTER — Other Ambulatory Visit: Payer: Self-pay | Admitting: Rheumatology

## 2023-10-11 DIAGNOSIS — M0579 Rheumatoid arthritis with rheumatoid factor of multiple sites without organ or systems involvement: Secondary | ICD-10-CM

## 2023-10-11 DIAGNOSIS — Z79899 Other long term (current) drug therapy: Secondary | ICD-10-CM

## 2023-10-11 NOTE — Telephone Encounter (Signed)
 Last Fill: 08/23/2023  Labs: 09/24/2023 White cell count is low secondary to Orencia . CMP normal. Sed rate is mildly elevated. SPEP normal. ANA remains positive, Sjogren's antibodies (SSA and SSB) remain positive. Complements normal. Labs do not indicate an autoimmune disease flare. If patient is doing well she may space Orencia  every 10 days. Repeat CBC in 1 month.   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   Okay to refill Orencia ?

## 2023-10-14 ENCOUNTER — Ambulatory Visit (INDEPENDENT_AMBULATORY_CARE_PROVIDER_SITE_OTHER): Admitting: Family Medicine

## 2023-10-14 ENCOUNTER — Encounter (INDEPENDENT_AMBULATORY_CARE_PROVIDER_SITE_OTHER): Payer: Self-pay | Admitting: Family Medicine

## 2023-10-14 VITALS — BP 121/82 | HR 85 | Temp 98.2°F | Ht 60.0 in | Wt 173.0 lb

## 2023-10-14 DIAGNOSIS — E1159 Type 2 diabetes mellitus with other circulatory complications: Secondary | ICD-10-CM

## 2023-10-14 DIAGNOSIS — K5903 Drug induced constipation: Secondary | ICD-10-CM

## 2023-10-14 DIAGNOSIS — I152 Hypertension secondary to endocrine disorders: Secondary | ICD-10-CM

## 2023-10-14 DIAGNOSIS — E669 Obesity, unspecified: Secondary | ICD-10-CM

## 2023-10-14 DIAGNOSIS — Z6839 Body mass index (BMI) 39.0-39.9, adult: Secondary | ICD-10-CM

## 2023-10-14 DIAGNOSIS — T383X5A Adverse effect of insulin and oral hypoglycemic [antidiabetic] drugs, initial encounter: Secondary | ICD-10-CM

## 2023-10-14 DIAGNOSIS — Z6833 Body mass index (BMI) 33.0-33.9, adult: Secondary | ICD-10-CM

## 2023-10-14 DIAGNOSIS — Z7985 Long-term (current) use of injectable non-insulin antidiabetic drugs: Secondary | ICD-10-CM

## 2023-10-14 DIAGNOSIS — E559 Vitamin D deficiency, unspecified: Secondary | ICD-10-CM

## 2023-10-14 MED ORDER — TIRZEPATIDE 10 MG/0.5ML ~~LOC~~ SOAJ: 10.0000 mg | SUBCUTANEOUS | 0 refills | Status: DC

## 2023-10-14 MED ORDER — POLYETHYLENE GLYCOL 3350 17 G PO PACK: 17.0000 g | PACK | Freq: Two times a day (BID) | ORAL | Status: AC

## 2023-10-14 NOTE — Progress Notes (Signed)
 Amber [name], D.O.  ABFM, ABOM Specializing in Clinical Bariatric Medicine  Office located at: [address] [place], [place]  [address]      A) FOR THE CHRONIC DISEASE OF OBESITY:  Chief complaint: Obesity Amber [name] is here to discuss her progress with her obesity treatment plan.   History of present illness / Interval history:  Amber [name] is here today for her follow-up office visit.  Since last OV on 09/05/2023, pt is down 9 lbs.   Reports that she has been eating more meat.     09/05/23 08:00 10/14/23 08:00   Body Fat % 47.3 % 44.8 %  Muscle Mass (lbs) 91.2 lbs 90.6 lbs  Fat Mass (lbs) 86 lbs 77.6 lbs  Total Body Water (lbs) 69.6 lbs 64.6 lbs  Visceral Fat Rating  14 13  Counseling done on how various foods will affect these numbers and how to maximize success   Total lbs lost to date: -41 lbs Total Fat Mass in lbs lost to date: -28 lbs Total weight loss percentage to date: -19.16 %   Nutrition Therapy She is on the Category 2 Plan and states she is following her eating plan approximately 70 % of the time.   - Tracking Calories/Macros: yes  - Eating More Whole Foods: yes  - Adequate Protein Intake: yes  - Adequate Water Intake: yes  - Skipping Meals: no -    - Sleeping 7-9 Hours/ Night: no -     Amber [name] is currently in the action stage of change. As such, her goal is to continue weight management plan.  She has agreed to: continue current plan   Physical Activity Pt is walking is 20  minutes 4 days per week   [place] has been advised to work up to 300-450 minutes of moderate intensity aerobic activity a week and strengthening exercises 2-3 times per week for cardiovascular health, weight loss maintenance and preservation of muscle mass.  She has agreed to : Think about enjoyable ways to increase daily physical activity and overcoming barriers to exercise, Increase physical activity in their day and reduce sedentary time  (increase NEAT)., Increase volume of physical activity to a goal of 240 minutes a week, and Combine aerobic and strengthening exercises for efficiency and improved cardiometabolic health.   Behavioral Modifications Evidence-based interventions for health behavior change were utilized today including the discussion of  1) self monitoring techniques:  none 2) problem-solving barriers:  none 3) self care:  exercise 4) SMART goals for next OV:  Increase exercise to 5 days week.  Regarding patient's less desirable eating habits and patterns, we employed the technique of small changes.   We discussed the following today: increasing lean protein intake to established goals and decreasing simple carbohydrates  Additional resources provided today: Handout on complex carbohydrates and lean sources of protein and Handout on reduced calorie nutrition plan concepts   Medical Interventions/ Pharmacotherapy Previous Bariatric surgery: no  Pharmacotherapy for weight loss: She is currently taking Mounjaro  for diabetes and off label for medical weight loss.    We discussed various medication options to help Amber [name] with her weight loss efforts and we both agreed to : Continue with current nutritional and behavioral strategies   B) OBESITY RELATED CONDITIONS ADDRESSED TODAY:  BMI 33.0-33.9,adult BMI 33.8 Current BMI 33.8 BMI 39.0-39.9,adult Starting BMI 39.30    Type 2 diabetes mellitus in patient with obesity [name]) Assessment & Plan Lab Results  Component Value Date   HGBA1C  6.1 08/06/2023   HGBA1C 6.4 04/26/2023   HGBA1C 6.4 01/25/2023   INSULIN  17.5 12/05/2022   INSULIN  16.5 09/04/2017  Currently on Mounjaro  10 mg weekly with with good compliance. Increased Mounjaro  dose from 7.5 mg to 10 mg weekly at LOV. Pt reports some constipation but denies any other side effects. Pt reports having sweet cravings. Educated pt to help reduce hunger and cravings, decrease carbohydrates because they  increase insulin  levels. A1c is at goal. No acute concerns today. Refill Mounjaro  today.   Cont decreasing simple carbohydrates, increase lean proteins, and exercises as able to prevent progression of T2DM.     Drug-induced constipation Assessment & Plan At LOV on 09/05/2023, Mounjaro  dose was increased from 7.5 mg weekly to 10 mg weekly. Pt reports experiencing constipation. Encouraged to increase fiber intake and increase water intake to half her body weight in oz per day. Prescribed Miralax, starting with twice daily until regular bowel movements, then reduce to once daily or PRN.     Hypertension associated with type 2 diabetes mellitus [facility]) Assessment & Plan  BP Readings from Last 3 Encounters:  10/14/23 121/82  09/24/23 135/89  09/05/23 101/70   The ASCVD Risk score (Arnett DK, et al., 2019) failed to calculate for the following reasons:   The valid total cholesterol range is 130 to 320 mg/dL  Lab Results  Component Value Date   CREATININE 0.63 09/24/2023  BP is at goal today. Currently on Carvedilol  12.5 mg twice daily, Valsartan  320 mg daily,  triamterene -hydrochlorothiazide  1 tablet once daily with good compliance and tolerance. Denies side effects and pt asx. No acute concerns. Encouraged to increase water intake and cont a heart-healthy, low sodium diet.     Creatinine levels were 1.3, 2 months but improved to 0.63, 2 weeks ago. Recently followed up nephrologist and obtained a urinalysis; everything was WNL. Cont to control BP for optimal renal function. Encouraged to increase water and exercise to increase blood flow to the kidneys. Avoid Advil  and Alleve. Cont following up with nephrology as needed.       Vitamin D  insufficiency Assessment & Plan  Lab Results  Component Value Date   VD25OH 34.49 08/06/2023   VD25OH 29.4 (L) 12/05/2022   VD25OH 37.2 09/04/2017  Pt is currently on Ergocalciferol   50,000 units weekly with good compliance and tolerance.Vit D levels are  below goal at 34.49, optimal 50-70. No acute concerns today. Cont regimen and recheck levels as necessary.      Medications Discontinued During This Encounter  Medication Reason   tirzepatide  (MOUNJARO ) 10 MG/0.5ML Pen Reorder     Meds ordered this encounter  Medications   tirzepatide  (MOUNJARO ) 10 MG/0.5ML Pen    Sig: Inject 10 mg into the skin once a week.    Dispense:  2 mL    Refill:  0   polyethylene glycol (MIRALAX / GLYCOLAX) 17 g packet    Sig: Take 17 g by mouth 2 (two) times daily. Until stooling regularly. Then once daily or prn      Follow up:   Return 11/11/2023 9:00 AM.  She was informed of the importance of frequent follow up visits to maximize her success with intensive lifestyle modifications for her multiple health conditions.   Weight Summary and Biometrics   Weight Lost Since Last Visit: 9lb  Weight Gained Since Last Visit: 0lb  ***  Vitals Temp: 98.2 F (36.8 C) BP: 121/82 Pulse Rate: 85 SpO2: 98 %   Anthropometric Measurements Height: 5' (1.524  m) Weight: 173 lb (78.5 kg) BMI (Calculated): 33.79 Weight at Last Visit: 182lb Weight Lost Since Last Visit: 9lb Weight Gained Since Last Visit: 0lb Starting Weight: 214lb Total Weight Loss (lbs): 41 lb (18.6 kg) Peak Weight: 218lb   Body Composition  Body Fat %: 44.8 % Fat Mass (lbs): 77.6 lbs Muscle Mass (lbs): 90.6 lbs Total Body Water (lbs): 64.6 lbs Visceral Fat Rating : 13   Other Clinical Data Fasting: No Labs: No Today's Visit #: 9 Starting Date: 12/05/22    Objective:   PHYSICAL EXAM: Blood pressure 121/82, pulse 85, temperature 98.2 F (36.8 C), height 5' (1.524 m), weight 173 lb (78.5 kg), last menstrual period 08/09/2016, SpO2 98%. Body mass index is 33.79 kg/m.  General: she is overweight, cooperative and in no acute distress. PSYCH: Has normal mood, affect and thought process.   HEENT: EOMI, sclerae are anicteric. Lungs: Normal breathing effort, no  conversational dyspnea. Extremities: Moves * 4 Neurologic: A and O * 3, good insight  DIAGNOSTIC DATA REVIEWED: BMET    Component Value Date/Time   NA 142 09/24/2023 0944   NA 137 12/05/2022 0942   NA 138 01/21/2015 0935   K 3.9 09/24/2023 0944   K 3.9 01/21/2015 0935   CL 107 09/24/2023 0944   CL 108 (H) 06/25/2012 1508   CO2 26 09/24/2023 0944   CO2 25 01/21/2015 0935   GLUCOSE 81 09/24/2023 0944   GLUCOSE 128 01/21/2015 0935   GLUCOSE 107 (H) 06/25/2012 1508   BUN 10 09/24/2023 0944   BUN 12 12/05/2022 0942   BUN 7.0 01/21/2015 0935   CREATININE 0.63 09/24/2023 0944   CREATININE 0.8 01/21/2015 0935   CALCIUM  9.7 09/24/2023 0944   CALCIUM  9.4 01/21/2015 0935   GFRNONAA 38 (L) 09/25/2022 2132   GFRNONAA 99 02/19/2020 1339   GFRAA 115 02/19/2020 1339   Lab Results  Component Value Date   HGBA1C 6.1 08/06/2023   HGBA1C 6.0 (H) 09/15/2010   Lab Results  Component Value Date   INSULIN  17.5 12/05/2022   INSULIN  16.5 09/04/2017   Lab Results  Component Value Date   TSH 1.560 12/05/2022   CBC    Component Value Date/Time   WBC 2.7 (L) 09/24/2023 0944   RBC 4.52 09/24/2023 0944   HGB 12.5 09/24/2023 0944   HGB 13.8 10/27/2018 1145   HGB 12.4 09/13/2016 0935   HCT 39.2 09/24/2023 0944   HCT 40.0 12/05/2022 0942   HCT 39.6 09/13/2016 0935   PLT 212 09/24/2023 0944   PLT 208 10/03/2018 1512   PLT 194 09/13/2016 0935   MCV 86.7 09/24/2023 0944   MCV 87.8 09/13/2016 0935   MCH 27.7 09/24/2023 0944   MCHC 31.9 (L) 09/24/2023 0944   RDW 12.4 09/24/2023 0944   RDW 14.4 09/13/2016 0935   Iron  Studies    Component Value Date/Time   IRON  92 12/05/2022 0942   IRON  79 09/13/2016 0935   TIBC 303 12/05/2022 0942   TIBC 326 09/13/2016 0935   FERRITIN 120 12/05/2022 0942   FERRITIN 40 09/13/2016 0935   IRONPCTSAT 30 12/05/2022 0942   IRONPCTSAT 29 07/17/2022 0837   Lipid Panel     Component Value Date/Time   CHOL 120 12/05/2022 0942   TRIG 85 12/05/2022 0942    HDL 54 12/05/2022 0942   CHOLHDL 3 07/17/2022 0837   VLDL 19.6 07/17/2022 0837   LDLCALC 49 12/05/2022 0942   LDLCALC 73 09/21/2019 1137   Hepatic Function Panel  Component Value Date/Time   PROT 7.9 09/24/2023 0944   PROT 8.0 09/24/2023 0944   PROT 8.8 (H) 12/05/2022 0942   PROT 9.2 (H) 01/21/2015 0935   ALBUMIN 4.1 04/26/2023 1119   ALBUMIN 4.3 12/05/2022 0942   ALBUMIN 3.5 01/21/2015 0935   AST 21 09/24/2023 0944   AST 17 01/21/2015 0935   ALT 22 09/24/2023 0944   ALT 15 01/21/2015 0935   ALKPHOS 56 04/26/2023 1119   ALKPHOS 54 01/21/2015 0935   BILITOT 0.9 09/24/2023 0944   BILITOT 0.8 12/05/2022 0942   BILITOT 0.55 01/21/2015 0935   BILIDIR 0.2 09/10/2018 1040      Component Value Date/Time   TSH 1.560 12/05/2022 0942   Nutritional Lab Results  Component Value Date   VD25OH 34.49 08/06/2023   VD25OH 29.4 (L) 12/05/2022   VD25OH 37.2 09/04/2017    Attestations:   LILLETTE Feliciano Mingle, acting as a Stage manager for Amber Jenkins, DO., have compiled all relevant documentation for today's office visit on behalf of Amber Jenkins, DO, while in the presence of [employer], DO.   I have reviewed the above documentation for accuracy and completeness, and I agree with the above. Amber JINNY [name], D.O.  The 21st Century Cures Act was signed into law in 2016 which includes the topic of electronic health records.  This provides immediate access to information in MyChart.  This includes consultation notes, operative notes, office notes, lab results and pathology reports.  If you have any questions about what you read please let us  know at your next visit so we can discuss your concerns and take corrective action if need be.  We are right here with you.

## 2023-10-15 ENCOUNTER — Ambulatory Visit

## 2023-10-15 ENCOUNTER — Ambulatory Visit: Admitting: Family

## 2023-10-15 ENCOUNTER — Ambulatory Visit (INDEPENDENT_AMBULATORY_CARE_PROVIDER_SITE_OTHER): Admitting: Family Medicine

## 2023-10-15 VITALS — BP 99/63 | HR 87 | Temp 100.0°F | Resp 16 | Ht 60.0 in | Wt 179.0 lb

## 2023-10-15 DIAGNOSIS — R509 Fever, unspecified: Secondary | ICD-10-CM

## 2023-10-15 DIAGNOSIS — J020 Streptococcal pharyngitis: Secondary | ICD-10-CM

## 2023-10-15 LAB — POC COVID19 BINAXNOW: SARS Coronavirus 2 Ag: NEGATIVE

## 2023-10-15 LAB — POC INFLUENZA A&B (BINAX/QUICKVUE)
Influenza A, POC: NEGATIVE
Influenza B, POC: NEGATIVE

## 2023-10-15 LAB — POCT RAPID STREP A (OFFICE): Rapid Strep A Screen: POSITIVE — AB

## 2023-10-15 MED ORDER — AMOXICILLIN 500 MG PO CAPS: 500.0000 mg | ORAL_CAPSULE | Freq: Three times a day (TID) | ORAL | 0 refills | Status: AC

## 2023-10-15 NOTE — Progress Notes (Signed)
 Subjective:     Patient ID: Amber [name], female    DOB: 07/04/1960, 63 y.o.   MRN: [id number]  Chief Complaint  Patient presents with   Sore Throat    Patient complains of sore throat   Generalized Body Aches   Cough    HPI  Discussed the use of AI scribe software for clinical note transcription with the patient, who gave verbal consent to proceed.  History of Present Illness   Amber [name] is a 63 year old female who presents with flu-like symptoms including low-grade fever, chills, and body aches.  Symptoms began on Thursday 10/9. She experiences low-grade fever, chills, body aches, and a sore throat. Over-the-counter medications have not alleviated her symptoms. A COVID-19 test was performed earlier in the illness and was negative. She attempted to visit an urgent care facility but left due to a three-hour wait time.    Health Maintenance Due  Topic Date Due   Influenza Vaccine  08/02/2023   COVID-19 Vaccine (6 - 2025-26 season) 09/02/2023   Pneumococcal Vaccine: 50+ Years (3 of 3 - PCV20 or PCV21) 09/10/2023   Mammogram  10/30/2023    Past Medical History:  Diagnosis Date   Anemia    Arthritis    Asthma 05/03/2020   Back pain    Blood transfusion without reported diagnosis    CAD (coronary artery disease) 04/24/2017   Cataract    Cerebrovascular disease 10/13/2020   Chronic diastolic CHF (congestive heart failure) (HCC) 04/24/2017   Colon polyps    Controlled type 2 diabetes mellitus without complication, without long-term current use of insulin  (HCC) 05/31/2017   Diabetic peripheral neuropathy (HCC) 01/05/2020   Ectopic pregnancy    RIGHT salpingostomy   Elective abortion    Elective abortion 09/26/2020   RIGHT salpingostomy   Essential hypertension 09/04/2017   GERD (gastroesophageal reflux disease)    Glaucoma    both eyes   Headache    migraines   History of chicken pox    History of chicken pox 09/26/2020   migraines    Hyperlipemia    Hypertension    Iron  deficiency anemia 09/02/2013   Joint pain    Migraine 04/19/2014   Obesity (BMI 30-39.9) 10/17/2012   Palpitations 10/01/2012   Pre-diabetes    Rheumatoid arthritis (HCC) 11/07/2011   Rheumatoid arthritis(714.0) 11/07/2011   SAB (spontaneous abortion)    SAB (spontaneous abortion) 11/07/2011   Shortness of breath    Sjogren's disease    Swelling of lower extremity    Vitamin B12 deficiency    Wheezing     Past Surgical History:  Procedure Laterality Date   ABDOMINAL SURGERY     bowel repair   CATARACT EXTRACTION     02/25/2023, 03/11/2023   COLONOSCOPY     COLONOSCOPY  08/2022   DILATION AND CURETTAGE OF UTERUS     X 2   KNEE ARTHROSCOPY W/ MENISCAL REPAIR Right 01/16/2023   MENISCUS REPAIR Right    MENISCUS TEAR REPAIR   MYOMECTOMY N/A 07/27/2014   Procedure: ABDOMINAL MYOMECTOMY;  Surgeon: Jerolyn Foil, MD;  Location: [facility];  Service: Gynecology;  Laterality: N/A;   PELVIC LAPAROSCOPY  1994   IN 1999 LAPAROSCOPY WITH INCIDENTAL ENTEROTOMY REQUIRING LAPAROTOMY   ROTATOR CUFF REPAIR Left 06/21/2021   UTERINE FIBROID SURGERY     July 2016    Family History  Problem Relation Age of Onset   Hypertension Mother        died from  heart disease   Heart disease Mother        deceased, unknown cause   Stroke Mother    Obesity Mother    High Cholesterol Mother    Sudden death Father    Diabetes Sister    Hypertension Sister    Esophageal cancer Sister    Stomach cancer Sister    Cancer Sister    Diabetes Sister    Parkinson's disease Brother    Rectal cancer Neg Hx    Colon cancer Neg Hx    Colon polyps Neg Hx     Social History   Socioeconomic History   Marital status: Single    Spouse name: Not on file   Number of children: 0   Years of education: college   Highest education level: Associate degree: occupational, Scientist, product/process development, or vocational program  Occupational History   Occupation: LPN    Employer: [employer]  Tobacco  Use   Smoking status: Never    Passive exposure: Never   Smokeless tobacco: Never  Vaping Use   Vaping status: Never Used  Substance and Sexual Activity   Alcohol use: No    Comment: occasional   Drug use: No   Sexual activity: Yes    Birth control/protection: None    Comment: 1st intercourse- 17, partners- 5  Other Topics Concern   Not on file  Social History Narrative   Regular exercise:  No   Caffeine Use: 1 cup coffee daily   No children   Happily divorced   Reports that she is involved at her church   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  Had a sister up in [place]  who passed away November 16, 2015 who she was close to.   Born in [place]- she came to [place]  as adult.  Age 73.    Left-handed.         Social Drivers of Health   Financial Resource Strain: Medium Risk (01/25/2023)   Overall Financial Resource Strain (CARDIA)    Difficulty of Paying Living Expenses: Somewhat hard  Food Insecurity: No Food Insecurity (01/25/2023)   Hunger Vital Sign    Worried About Running Out of Food in the Last Year: Never true    Ran Out of Food in the Last Year: Never true  Transportation Needs: No Transportation Needs (01/25/2023)   PRAPARE - Administrator, Civil Service (Medical): No    Lack of Transportation (Non-Medical): No  Physical Activity: Inactive (01/25/2023)   Exercise Vital Sign    Days of Exercise per Week: 0 days    Minutes of Exercise per Session: 20 min  Stress: No Stress Concern Present (01/25/2023)   [employer] of Occupational Health - Occupational Stress Questionnaire    Feeling of Stress : Not at all  Social Connections: Moderately Integrated (01/25/2023)   Social Connection and Isolation Panel    Frequency of Communication with Friends and Family: More than three times a week    Frequency of Social Gatherings with Friends and Family: Once a week    Attends Religious Services: More than 4 times per year    Active Member of [employer] or  Organizations: Yes    Attends Banker Meetings: More than 4 times per year    Marital Status: Divorced  Intimate Partner Violence: Unknown (04/04/2021)   Received from [facility]   HITS    Physically Hurt: Not on file    Insult or Talk Down To: Not on file  Threaten Physical Harm: Not on file    Scream or Curse: Not on file    Outpatient Medications Prior to Visit  Medication Sig Dispense Refill   albuterol  (VENTOLIN  HFA) 108 (90 Base) MCG/ACT inhaler Inhale 2 puffs into the lungs every 6 (six) hours as needed. 18 g 0   Aspirin  81 MG CAPS 81 mg.     Blood Glucose Monitoring Suppl DEVI 1 each by Does not apply route in the morning, at noon, and at bedtime. May substitute to any manufacturer covered by patient's insurance. 1 each 0   brimonidine-timolol (COMBIGAN) 0.2-0.5 % ophthalmic solution      calcium -vitamin D  (OSCAL WITH D) 500-200 MG-UNIT TABS tablet Take by mouth.     carvedilol  (COREG ) 12.5 MG tablet Take 1 tablet (12.5 mg total) by mouth 2 (two) times daily with a meal. 180 tablet 1   cyanocobalamin  (VITAMIN B12) 1000 MCG/ML injection 1000mcg IM weekly x 4 weeks then once monthly 12 mL 0   diltiazem  (CARTIA  XT) 240 MG 24 hr capsule Take 1 capsule (240 mg total) by mouth daily. 90 capsule 1   fluticasone  (FLONASE ) 50 MCG/ACT nasal spray Place 2 sprays into both nostrils daily. 16 g 1   Fremanezumab -vfrm (AJOVY ) 225 MG/1.5ML SOAJ Inject 225 mg into the skin every 30 (thirty) days. 1.68 mL 11   gabapentin  (NEURONTIN ) 100 MG capsule Take 1 capsule (100 mg total) by mouth at bedtime. 90 capsule 1   ibuprofen  (ADVIL ) 200 MG tablet Take 200 mg by mouth every 6 (six) hours as needed.     Iron , Ferrous Sulfate , 325 (65 Fe) MG TABS Take 325 mg by mouth 2 (two) times daily. 30 tablet    Lancets (ONETOUCH DELICA PLUS LANCET33G) MISC Apply 1 each topically daily.     ORENCIA  CLICKJECT 125 MG/ML SOAJ INJECT 125 MG UNDER THE SKIN EVERY 7 DAYS 4 mL 1   polyethylene glycol  (MIRALAX / GLYCOLAX) 17 g packet Take 17 g by mouth 2 (two) times daily. Until stooling regularly. Then once daily or prn     rosuvastatin  (CRESTOR ) 10 MG tablet Take 1 tablet (10 mg total) by mouth daily. 90 tablet 1   SUMAtriptan  (IMITREX ) 100 MG tablet Take 1 tablet (100 mg total) by mouth as needed for migraine. May repeat in 2 hours if headache persists or recurs. 30 tablet 5   Syringe/Needle, Disp, (SYRINGE 3CC/22GX1-1/2) 22G X 1-1/2 3 ML MISC Use as directed 50 each 0   timolol (TIMOPTIC) 0.5 % ophthalmic solution 1 drop every morning.     tirzepatide  (MOUNJARO ) 10 MG/0.5ML Pen Inject 10 mg into the skin once a week. 2 mL 0   TRAVATAN Z 0.004 % SOLN ophthalmic solution Place 1 drop into both eyes at bedtime.      triamterene -hydrochlorothiazide  (MAXZIDE-25) 37.5-25 MG tablet Take 1 tablet by mouth daily. 90 tablet 1   valsartan  (DIOVAN ) 320 MG tablet Take 1 tablet (320 mg total) by mouth daily. 90 tablet 1   Vitamin D , Ergocalciferol , (DRISDOL ) 1.25 MG (50000 UNIT) CAPS capsule Take 1 capsule (50,000 Units total) by mouth every 7 (seven) days. 13 capsule 0   No facility-administered medications prior to visit.    Allergies  Allergen Reactions   Gabapentin      Made her crazy    ROS See HPI    Objective:    Physical Exam Constitutional:      General: She is not in acute distress.    Appearance: Normal appearance. She is  well-developed.  HENT:     Head: Normocephalic and atraumatic.     Right Ear: Tympanic membrane, ear canal and external ear normal.     Left Ear: Tympanic membrane, ear canal and external ear normal.     Mouth/Throat:     Pharynx: Posterior oropharyngeal erythema present.     Tonsils: Tonsillar exudate present. 2+ on the right. 2+ on the left.  Eyes:     General: No scleral icterus. Neck:     Thyroid : No thyromegaly.  Cardiovascular:     Rate and Rhythm: Normal rate and regular rhythm.     Heart sounds: Normal heart sounds. No murmur  heard. Pulmonary:     Effort: Pulmonary effort is normal. No respiratory distress.     Breath sounds: Normal breath sounds. No wheezing.  Musculoskeletal:     Cervical back: Neck supple.  Skin:    General: Skin is warm and dry.  Neurological:     Mental Status: She is alert and oriented to person, place, and time.  Psychiatric:        Mood and Affect: Mood normal.        Behavior: Behavior normal.        Thought Content: Thought content normal.        Judgment: Judgment normal.      BP 99/63 (BP Location: Right Arm, Patient Position: Sitting, Cuff Size: Large)   Pulse 87   Temp 100 F (37.8 C) (Oral)   Resp 16   Ht 5' (1.524 m)   Wt 179 lb (81.2 kg)   LMP 08/09/2016   SpO2 100%   BMI 34.96 kg/m  Wt Readings from Last 3 Encounters:  10/15/23 179 lb (81.2 kg)  10/14/23 173 lb (78.5 kg)  09/24/23 180 lb 6.4 oz (81.8 kg)       Assessment & Plan:   Problem List Items Addressed This Visit   None Visit Diagnoses       Fever, unspecified fever cause    -  Primary   Relevant Orders   POCT rapid strep A (Completed)   POC Influenza A&B (Binax test) (Completed)   POC COVID-19 (Completed)     Strep pharyngitis       Relevant Medications   amoxicillin  (AMOXIL ) 500 MG capsule     Rapid strep +.  Covid and flu negative. Recommend amoxicillin  tid x 10 days, note provided for work. Pt is advised to call if symptoms worsen or if symptoms fail to improve.  I am having Amber [name] start on amoxicillin . I am also having her maintain her Travatan Z, calcium -vitamin D , Iron  (Ferrous Sulfate ), fluticasone , albuterol , SUMAtriptan , ibuprofen , timolol, brimonidine-timolol, Ajovy , SYRINGE 3CC/22GX1-1/2, Aspirin , Blood Glucose Monitoring Suppl, OneTouch Delica Plus Lancet33G, carvedilol , gabapentin , cyanocobalamin , valsartan , diltiazem , triamterene -hydrochlorothiazide , rosuvastatin , Vitamin D  (Ergocalciferol ), Orencia  ClickJect, tirzepatide , and polyethylene glycol.  Meds ordered  this encounter  Medications   amoxicillin  (AMOXIL ) 500 MG capsule    Sig: Take 1 capsule (500 mg total) by mouth 3 (three) times daily for 10 days.    Dispense:  30 capsule    Refill:  0    Supervising Provider:   DOMENICA BLACKBIRD A [[id number]]

## 2023-10-30 ENCOUNTER — Encounter

## 2023-10-30 ENCOUNTER — Ambulatory Visit: Admitting: Adult Health

## 2023-10-30 ENCOUNTER — Encounter: Payer: Self-pay | Admitting: Adult Health

## 2023-10-30 DIAGNOSIS — Z1231 Encounter for screening mammogram for malignant neoplasm of breast: Secondary | ICD-10-CM

## 2023-10-30 NOTE — Progress Notes (Deleted)
 [facility] [address] [place]. [place] [address] [phone]       OFFICE FOLLOW UP NOTE  Ms. TINYA CADOGAN Date of Birth:  September 08, 1960 Medical Record Number:  [id number]    Primary neurologist: Dr. Buck Reason for visit: Initial CPAP follow-up, migraine headaches    SUBJECTIVE:   CHIEF COMPLAINT:  No chief complaint on file.   Follow-up visit:  Prior visit: 04/08/2023  Brief HPI:   PHILLIS THACKERAY is a 63 y.o. female with complex medical history of CAD, CHF, HTN, HLD, anemia, rheumatoid arthritis, Sjogren's disease, DM with diabetic neuropathy, asthma and severe obesity with BMI of over 40.  She was evaluated by Dr. Buck in 10/2022 for headaches.  Previously followed by Dr. Rush.  Migraines previously controlled on Emgality  but discontinued use for no specific reason.  Was evaluated in the ED 09/2022 for right sided headache and received migraine cocktail with resolution.  CTA head/neck benign. Sleep study 12/2022 showed moderate to severe OSA with total AHI of 21.8/h, REM AHI of 31.3/h, supine AHI of 26.8/h and O2 nadir of 83%.  Recommended initiation of CPAP, set up 01/2023.   At prior visit, she was switched from Emgality  to Ajovy  due to continued migraine headaches.  Suboptimal CPAP compliance due to difficulty tolerating fullface mask and requested follow-up with DME for change of interface.    Interval history:  Returns today for 61-month follow-up.  She has not used CPAP since mid August ***  Use of Ajovy  ***  Being seen today for initial CPAP compliance visit.  Has been having difficulty tolerating FFM, having frequent leaks, did have f/u with DME [employer] [facility] and received smaller mask but continues to have the same issue.  Has not noticed any significant benefit in regards to daytime energy levels and sleep quality.  ESS 10/24.  Notes some improvement of migraine headaches since restarting Emgality  but continues to have about 2/week,  use of sumatriptan  with benefit.  Reports increased stress can trigger migraines.  She is currently recovering from meniscus tear with repair in 01/2023, currently working with PT.         ROS:   14 system review of systems performed and negative with exception of those listed in HPI  PMH:  Past Medical History:  Diagnosis Date   Anemia    Arthritis    Asthma 05/03/2020   Back pain    Blood transfusion without reported diagnosis    CAD (coronary artery disease) 04/24/2017   Cataract    Cerebrovascular disease 10/13/2020   Chronic diastolic CHF (congestive heart failure) (HCC) 04/24/2017   Colon polyps    Controlled type 2 diabetes mellitus without complication, without long-term current use of insulin  (HCC) 05/31/2017   Diabetic peripheral neuropathy (HCC) 01/05/2020   Ectopic pregnancy    RIGHT salpingostomy   Elective abortion    Elective abortion 09/26/2020   RIGHT salpingostomy   Essential hypertension 09/04/2017   GERD (gastroesophageal reflux disease)    Glaucoma    both eyes   Headache    migraines   History of chicken pox    History of chicken pox 09/26/2020   migraines   Hyperlipemia    Hypertension    Iron  deficiency anemia 09/02/2013   Joint pain    Migraine 04/19/2014   Obesity (BMI 30-39.9) 10/17/2012   Palpitations 10/01/2012   Pre-diabetes    Rheumatoid arthritis (HCC) 11/07/2011   Rheumatoid arthritis(714.0) 11/07/2011   SAB (spontaneous abortion)    SAB (  spontaneous abortion) 11/07/2011   Shortness of breath    Sjogren's disease    Swelling of lower extremity    Vitamin B12 deficiency    Wheezing     PSH:  Past Surgical History:  Procedure Laterality Date   ABDOMINAL SURGERY     bowel repair   CATARACT EXTRACTION     02/25/2023, 03/11/2023   COLONOSCOPY     COLONOSCOPY  08/2022   DILATION AND CURETTAGE OF UTERUS     X 2   KNEE ARTHROSCOPY W/ MENISCAL REPAIR Right 01/16/2023   MENISCUS REPAIR Right    MENISCUS TEAR REPAIR    MYOMECTOMY N/A 07/27/2014   Procedure: ABDOMINAL MYOMECTOMY;  Surgeon: Jerolyn Foil, MD;  Location: [facility];  Service: Gynecology;  Laterality: N/A;   PELVIC LAPAROSCOPY  1994   IN 1999 LAPAROSCOPY WITH INCIDENTAL ENTEROTOMY REQUIRING LAPAROTOMY   ROTATOR CUFF REPAIR Left 06/21/2021   UTERINE FIBROID SURGERY     July 2016    Social History:  Social History   Socioeconomic History   Marital status: Single    Spouse name: Not on file   Number of children: 0   Years of education: college   Highest education level: Associate degree: occupational, scientist, product/process development, or vocational program  Occupational History   Occupation: LPN    Employer: [employer]  Tobacco Use   Smoking status: Never    Passive exposure: Never   Smokeless tobacco: Never  Vaping Use   Vaping status: Never Used  Substance and Sexual Activity   Alcohol use: No    Comment: occasional   Drug use: No   Sexual activity: Yes    Birth control/protection: None    Comment: 1st intercourse- 17, partners- 5  Other Topics Concern   Not on file  Social History Narrative   Regular exercise:  No   Caffeine Use: 1 cup coffee daily   No children   Happily divorced   Reports that she is involved at her church   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  Had a sister up in [place]  who passed away 2015-11-20 who she was close to.   Born in [place]- she came [place]  as adult.  Age 13.    Left-handed.         Social Drivers of Health   Financial Resource Strain: Medium Risk (01/25/2023)   Overall Financial Resource Strain (CARDIA)    Difficulty of Paying Living Expenses: Somewhat hard  Food Insecurity: No Food Insecurity (01/25/2023)   Hunger Vital Sign    Worried About Running Out of Food in the Last Year: Never true    Ran Out of Food in the Last Year: Never true  Transportation Needs: No Transportation Needs (01/25/2023)   PRAPARE - Administrator, Civil Service (Medical): No    Lack of Transportation  (Non-Medical): No  Physical Activity: Inactive (01/25/2023)   Exercise Vital Sign    Days of Exercise per Week: 0 days    Minutes of Exercise per Session: 20 min  Stress: No Stress Concern Present (01/25/2023)   [employer] of Occupational Health - Occupational Stress Questionnaire    Feeling of Stress : Not at all  Social Connections: Moderately Integrated (01/25/2023)   Social Connection and Isolation Panel    Frequency of Communication with Friends and Family: More than three times a week    Frequency of Social Gatherings with Friends and Family: Once a week    Attends Religious Services:  More than 4 times per year    Active Member of Clubs or Organizations: Yes    Attends Banker Meetings: More than 4 times per year    Marital Status: Divorced  Intimate Partner Violence: Unknown (04/04/2021)   Received from [facility]   HITS    Physically Hurt: Not on file    Insult or Talk Down To: Not on file    Threaten Physical Harm: Not on file    Scream or Curse: Not on file    Family History:  Family History  Problem Relation Age of Onset   Hypertension Mother        died from heart disease   Heart disease Mother        deceased, unknown cause   Stroke Mother    Obesity Mother    High Cholesterol Mother    Sudden death Father    Diabetes Sister    Hypertension Sister    Esophageal cancer Sister    Stomach cancer Sister    Cancer Sister    Diabetes Sister    Parkinson's disease Brother    Rectal cancer Neg Hx    Colon cancer Neg Hx    Colon polyps Neg Hx     Medications:   Current Outpatient Medications on File Prior to Visit  Medication Sig Dispense Refill   albuterol  (VENTOLIN  HFA) 108 (90 Base) MCG/ACT inhaler Inhale 2 puffs into the lungs every 6 (six) hours as needed. 18 g 0   Aspirin  81 MG CAPS 81 mg.     Blood Glucose Monitoring Suppl DEVI 1 each by Does not apply route in the morning, at noon, and at bedtime. May substitute to any manufacturer  covered by patient's insurance. 1 each 0   brimonidine-timolol (COMBIGAN) 0.2-0.5 % ophthalmic solution      calcium -vitamin D  (OSCAL WITH D) 500-200 MG-UNIT TABS tablet Take by mouth.     carvedilol  (COREG ) 12.5 MG tablet Take 1 tablet (12.5 mg total) by mouth 2 (two) times daily with a meal. 180 tablet 1   cyanocobalamin  (VITAMIN B12) 1000 MCG/ML injection 1000mcg IM weekly x 4 weeks then once monthly 12 mL 0   diltiazem  (CARTIA  XT) 240 MG 24 hr capsule Take 1 capsule (240 mg total) by mouth daily. 90 capsule 1   fluticasone  (FLONASE ) 50 MCG/ACT nasal spray Place 2 sprays into both nostrils daily. 16 g 1   Fremanezumab -vfrm (AJOVY ) 225 MG/1.5ML SOAJ Inject 225 mg into the skin every 30 (thirty) days. 1.68 mL 11   gabapentin  (NEURONTIN ) 100 MG capsule Take 1 capsule (100 mg total) by mouth at bedtime. 90 capsule 1   ibuprofen  (ADVIL ) 200 MG tablet Take 200 mg by mouth every 6 (six) hours as needed.     Iron , Ferrous Sulfate , 325 (65 Fe) MG TABS Take 325 mg by mouth 2 (two) times daily. 30 tablet    Lancets (ONETOUCH DELICA PLUS LANCET33G) MISC Apply 1 each topically daily.     ORENCIA  CLICKJECT 125 MG/ML SOAJ INJECT 125 MG UNDER THE SKIN EVERY 7 DAYS 4 mL 1   polyethylene glycol (MIRALAX / GLYCOLAX) 17 g packet Take 17 g by mouth 2 (two) times daily. Until stooling regularly. Then once daily or prn     rosuvastatin  (CRESTOR ) 10 MG tablet Take 1 tablet (10 mg total) by mouth daily. 90 tablet 1   SUMAtriptan  (IMITREX ) 100 MG tablet Take 1 tablet (100 mg total) by mouth as needed for migraine. May repeat in  2 hours if headache persists or recurs. 30 tablet 5   Syringe/Needle, Disp, (SYRINGE 3CC/22GX1-1/2) 22G X 1-1/2 3 ML MISC Use as directed 50 each 0   timolol (TIMOPTIC) 0.5 % ophthalmic solution 1 drop every morning.     tirzepatide  (MOUNJARO ) 10 MG/0.5ML Pen Inject 10 mg into the skin once a week. 2 mL 0   TRAVATAN Z 0.004 % SOLN ophthalmic solution Place 1 drop into both eyes at bedtime.       triamterene -hydrochlorothiazide  (MAXZIDE-25) 37.5-25 MG tablet Take 1 tablet by mouth daily. 90 tablet 1   valsartan  (DIOVAN ) 320 MG tablet Take 1 tablet (320 mg total) by mouth daily. 90 tablet 1   Vitamin D , Ergocalciferol , (DRISDOL ) 1.25 MG (50000 UNIT) CAPS capsule Take 1 capsule (50,000 Units total) by mouth every 7 (seven) days. 13 capsule 0   No current facility-administered medications on file prior to visit.    Allergies:   Allergies  Allergen Reactions   Gabapentin      Made her crazy      OBJECTIVE:  Physical Exam  There were no vitals filed for this visit.  There is no height or weight on file to calculate BMI. No results found.   General: well developed, well nourished, very pleasant middle-aged female, seated, in no evident distress  Neurologic Exam Mental Status: Awake and fully alert. Oriented to place and time. Recent and remote memory intact. Attention span, concentration and fund of knowledge appropriate. Mood and affect appropriate.  Cranial Nerves: Pupils equal, briskly reactive to light. Extraocular movements full without nystagmus. Visual fields full to confrontation. Hearing intact. Facial sensation intact. Face, tongue, palate moves normally and symmetrically.  Motor: Normal bulk and tone. Normal strength in all tested extremity muscles Gait and Station: Arises from chair without difficulty. Stance is normal. Gait demonstrates normal stride length and balance without use of AD with mild favoring of RLE        ASSESSMENT/PLAN: MARCH JOOS is a 63 y.o. year old female    OSA on CPAP :  Suboptimal compliance d/t difficulty tolerating FFM, will request change of interface to improvement tolerance and compliance Continue current pressure settings of 6-12 with EPR 3.   Discussed importance of nightly usage with ensuring greater than 4 hours nightly for optimal benefit and per insurance purposes.   Continue to follow with DME company for  any needed supplies or CPAP related concerns  Migraine headaches:  Improved on Emgality  but still having about 8 migraines per month Prevention: Recommend switching CRGP from Emgality  to Ajovy  Rescue: Continue sumatriptan  as needed Previously tried/failed: Topiramate , metoprolol , valsartan , lisinopril , Emgality , sumatriptan , Flexeril      Follow up in 6 months or call earlier if needed   CC:  PCP: Daryl Setter, NP      Harlene Bogaert, AGNP-BC  [facility] [address] [place], [place] [address]  Phone [phone] Fax [phone] Note: This document was prepared with digital dictation and possible smart phrase technology. Any transcriptional errors that result from this process are unintentional.

## 2023-11-11 ENCOUNTER — Encounter (INDEPENDENT_AMBULATORY_CARE_PROVIDER_SITE_OTHER): Payer: Self-pay | Admitting: Adult Health

## 2023-11-11 ENCOUNTER — Ambulatory Visit (INDEPENDENT_AMBULATORY_CARE_PROVIDER_SITE_OTHER): Payer: Self-pay | Admitting: Adult Health

## 2023-11-11 VITALS — BP 120/77 | HR 86 | Temp 98.2°F | Ht 60.0 in | Wt 171.0 lb

## 2023-11-11 DIAGNOSIS — I152 Hypertension secondary to endocrine disorders: Secondary | ICD-10-CM

## 2023-11-11 DIAGNOSIS — Z6833 Body mass index (BMI) 33.0-33.9, adult: Secondary | ICD-10-CM

## 2023-11-11 DIAGNOSIS — E559 Vitamin D deficiency, unspecified: Secondary | ICD-10-CM

## 2023-11-11 DIAGNOSIS — E1159 Type 2 diabetes mellitus with other circulatory complications: Secondary | ICD-10-CM

## 2023-11-11 DIAGNOSIS — E669 Obesity, unspecified: Secondary | ICD-10-CM | POA: Diagnosis not present

## 2023-11-11 DIAGNOSIS — Z7985 Long-term (current) use of injectable non-insulin antidiabetic drugs: Secondary | ICD-10-CM

## 2023-11-11 DIAGNOSIS — E119 Type 2 diabetes mellitus without complications: Secondary | ICD-10-CM

## 2023-11-11 MED ORDER — TIRZEPATIDE 10 MG/0.5ML ~~LOC~~ SOAJ: 10.0000 mg | SUBCUTANEOUS | 0 refills | Status: DC

## 2023-11-11 MED ORDER — VITAMIN D (ERGOCALCIFEROL) 1.25 MG (50000 UNIT) PO CAPS: 50000.0000 [IU] | ORAL_CAPSULE | ORAL | 0 refills | Status: DC
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
## 2023-11-14 ENCOUNTER — Other Ambulatory Visit: Payer: Self-pay | Admitting: Family

## 2023-11-14 DIAGNOSIS — Z1231 Encounter for screening mammogram for malignant neoplasm of breast: Secondary | ICD-10-CM

## 2023-11-18 ENCOUNTER — Ambulatory Visit
Admission: RE | Admit: 2023-11-18 | Discharge: 2023-11-18 | Disposition: A | Discharge status: Home / Self Care | Source: Ambulatory Visit

## 2023-11-18 DIAGNOSIS — Z1231 Encounter for screening mammogram for malignant neoplasm of breast: Secondary | ICD-10-CM

## 2023-11-20 ENCOUNTER — Ambulatory Visit: Payer: Self-pay | Admitting: Family

## 2023-11-26 ENCOUNTER — Emergency Department (HOSPITAL_BASED_OUTPATIENT_CLINIC_OR_DEPARTMENT_OTHER): Admitting: Radiology

## 2023-11-26 ENCOUNTER — Emergency Department (HOSPITAL_BASED_OUTPATIENT_CLINIC_OR_DEPARTMENT_OTHER)
Admission: EM | Admit: 2023-11-26 | Discharge: 2023-11-26 | Disposition: A | Discharge status: Home / Self Care | Attending: Emergency Medicine | Admitting: Emergency Medicine

## 2023-11-26 ENCOUNTER — Other Ambulatory Visit: Payer: Self-pay

## 2023-11-26 ENCOUNTER — Emergency Department (HOSPITAL_BASED_OUTPATIENT_CLINIC_OR_DEPARTMENT_OTHER)

## 2023-11-26 DIAGNOSIS — Z7982 Long term (current) use of aspirin: Secondary | ICD-10-CM | POA: Insufficient documentation

## 2023-11-26 DIAGNOSIS — Z79899 Other long term (current) drug therapy: Secondary | ICD-10-CM | POA: Diagnosis not present

## 2023-11-26 DIAGNOSIS — Z7984 Long term (current) use of oral hypoglycemic drugs: Secondary | ICD-10-CM | POA: Insufficient documentation

## 2023-11-26 DIAGNOSIS — M25571 Pain in right ankle and joints of right foot: Secondary | ICD-10-CM | POA: Diagnosis present

## 2023-11-26 DIAGNOSIS — E119 Type 2 diabetes mellitus without complications: Secondary | ICD-10-CM | POA: Diagnosis not present

## 2023-11-26 DIAGNOSIS — I509 Heart failure, unspecified: Secondary | ICD-10-CM | POA: Insufficient documentation

## 2023-11-26 DIAGNOSIS — M79604 Pain in right leg: Secondary | ICD-10-CM | POA: Diagnosis not present

## 2023-11-26 DIAGNOSIS — I2510 Atherosclerotic heart disease of native coronary artery without angina pectoris: Secondary | ICD-10-CM | POA: Diagnosis not present

## 2023-11-26 DIAGNOSIS — I110 Hypertensive heart disease with heart failure: Secondary | ICD-10-CM | POA: Diagnosis not present

## 2023-11-26 MED ORDER — IBUPROFEN 400 MG PO TABS
600.0000 mg | ORAL_TABLET | Freq: Once | ORAL | Status: AC
  Administered 2023-11-26: 600 mg via ORAL
  Filled 2023-11-26: qty 1

## 2023-11-26 NOTE — Discharge Instructions (Addendum)
 Your x-ray results today are reassuring, you have no evidence of an ankle fracture or dislocation, and the ultrasound of your right leg does not show any evidence of a deep vein thrombosis, or blood clot.  Continue ibuprofen /Tylenol  as needed for pain, continue to rest/ice/elevate your right ankle, you may continue to wear an Ace wrap as needed for comfort.  I provided you with the contact information for an orthopedic specialist, please contact their office to schedule follow-up if your symptoms persist.

## 2023-11-26 NOTE — ED Notes (Signed)
 Patient verbalizes understanding of discharge instructions. Opportunity for questioning and answers were provided. Armband removed by staff, pt discharged from ED. Wheeled out to lobby

## 2023-11-26 NOTE — ED Triage Notes (Signed)
 Patient states right ankle pain and swelling for the past week. Denies injury.

## 2023-11-26 NOTE — ED Provider Notes (Signed)
 [place] EMERGENCY DEPARTMENT AT [facility] Provider Note   CSN: [id number] Arrival date & time: 11/26/23  1606     Patient presents with: Ankle Pain   Amber [name] is a 63 y.o. female.   63 year old female presenting with right ankle pain.  Patient notes that symptoms began about 1 week ago, she notes that she was getting ready for work the day that her pain began and noted some ankle soreness, no known injury or trigger/inciting event.  Notes worsening swelling and discomfort at the right ankle with some associated tingling in her toes, pain is exacerbated by ambulation.  History of right knee surgery in January of this year, no history of DVT.   Ankle Pain      Prior to Admission medications   Medication Sig Start Date End Date Taking? Authorizing Provider  albuterol  (VENTOLIN  HFA) 108 (90 Base) MCG/ACT inhaler Inhale 2 puffs into the lungs every 6 (six) hours as needed. 03/08/22   Hazen Darryle BRAVO, FNP  Aspirin  81 MG CAPS 81 mg.    [provider]  Blood Glucose Monitoring Suppl DEVI 1 each by Does not apply route in the morning, at noon, and at bedtime. May substitute to any manufacturer covered by patient's insurance. 05/29/23   Danford, Rockie D, NP  brimonidine-timolol (COMBIGAN) 0.2-0.5 % ophthalmic solution  03/24/23   [provider]  calcium -vitamin D  (OSCAL WITH D) 500-200 MG-UNIT TABS tablet Take by mouth.    [provider]  carvedilol  (COREG ) 12.5 MG tablet Take 1 tablet (12.5 mg total) by mouth 2 (two) times daily with a meal. 08/06/23   Daryl Setter, NP  cyanocobalamin  (VITAMIN B12) 1000 MCG/ML injection 1000mcg IM weekly x 4 weeks then once monthly 08/06/23   [name], [name], NP  diltiazem  (CARTIA  XT) 240 MG 24 hr capsule Take 1 capsule (240 mg total) by mouth daily. 08/06/23   [name], [name], NP  fluticasone  (FLONASE ) 50 MCG/ACT nasal spray Place 2 sprays into both nostrils daily. 03/07/22   [name], [name], NP   Fremanezumab -vfrm (AJOVY ) 225 MG/1.5ML SOAJ Inject 225 mg into the skin every 30 (thirty) days. 04/08/23   Whitfield Raisin, NP  gabapentin  (NEURONTIN ) 100 MG capsule Take 1 capsule (100 mg total) by mouth at bedtime. 08/06/23   [name], [name], NP  ibuprofen  (ADVIL ) 200 MG tablet Take 200 mg by mouth every 6 (six) hours as needed.    [provider]  Iron , Ferrous Sulfate , 325 (65 Fe) MG TABS Take 325 mg by mouth 2 (two) times daily. 11/07/21   [name], [name], NP  Lancets [facility] DELICA PLUS Gross) MISC Apply 1 each topically daily. 05/29/23   [provider]  ORENCIA  CLICKJECT 125 MG/ML SOAJ INJECT 125 MG UNDER THE SKIN EVERY 7 DAYS 10/11/23   Dolphus Reiter, MD  polyethylene glycol (MIRALAX  / GLYCOLAX ) 17 g packet Take 17 g by mouth 2 (two) times daily. Until stooling regularly. Then once daily or prn 10/14/23   Opalski, Barnie, DO  rosuvastatin  (CRESTOR ) 10 MG tablet Take 1 tablet (10 mg total) by mouth daily. 08/06/23   [name], [name], NP  SUMAtriptan  (IMITREX ) 100 MG tablet Take 1 tablet (100 mg total) by mouth as needed for migraine. May repeat in 2 hours if headache persists or recurs. 07/17/22   [name], [name], NP  Syringe/Needle, Disp, (SYRINGE 3CC/22GX1-1/2) 22G X 1-1/2 3 ML MISC Use as directed 04/26/23   [name], [name], NP  timolol (TIMOPTIC) 0.5 % ophthalmic solution 1 drop every morning. 07/19/22  [provider]  tirzepatide  (MOUNJARO ) 10 MG/0.5ML Pen Inject 10 mg into the skin once a week. 11/11/23   Danford, Rockie D, NP  TRAVATAN Z 0.004 % SOLN ophthalmic solution Place 1 drop into both eyes at bedtime.  10/25/11   [provider]  triamterene -hydrochlorothiazide  (MAXZIDE-25) 37.5-25 MG tablet Take 1 tablet by mouth daily. 08/06/23   [name], [name], NP  valsartan  (DIOVAN ) 320 MG tablet Take 1 tablet (320 mg total) by mouth daily. 08/06/23   [name], [name], NP  Vitamin D , Ergocalciferol , (DRISDOL ) 1.25 MG (50000 UNIT)  CAPS capsule Take 1 capsule (50,000 Units total) by mouth every 7 (seven) days. 11/11/23   Jonel Rockie D, NP    Allergies: Gabapentin     Review of Systems  Updated Vital Signs  Vitals:   11/26/23 1615  BP: (!) 150/89  Pulse: 97  Resp: 17  Temp: 99.1 F (37.3 C)  SpO2: 100%     Physical Exam Vitals and nursing note reviewed.  HENT:     Head: Normocephalic.  Eyes:     Extraocular Movements: Extraocular movements intact.  Cardiovascular:     Rate and Rhythm: Normal rate.  Pulmonary:     Effort: Pulmonary effort is normal.  Musculoskeletal:     Cervical back: Normal range of motion.     Comments: R ankle: Diffuse non-pitting edema circumferentially at the ankle, limited dorsiflexion secondary to pain, tenderness to palpation of the medial/lateral malleolus as well as the Achille's tendon region, no obvious disruption of the Achille's tendon. No appreciable erythema or warmth. 2+ DP pulse.   Skin:    General: Skin is warm and dry.  Neurological:     Mental Status: She is alert and oriented to person, place, and time.     (all labs ordered are listed, but only abnormal results are displayed) Labs Reviewed - No data to display  EKG: None  Radiology: US  Venous Img Lower Right (DVT Study) Result Date: 11/26/2023 EXAM: ULTRASOUND DUPLEX OF THE RIGHT LOWER EXTREMITY VEINS TECHNIQUE: Duplex ultrasound using B-mode/gray scaled imaging and Doppler spectral analysis and color flow was obtained of the deep venous structures of the right lower extremity. COMPARISON: None available. CLINICAL HISTORY: R ankle pain/swelling, atraumatic FINDINGS: The common femoral vein, femoral vein, popliteal vein, and posterior tibial vein of the  right  lower extremity demonstrate normal compressibility with normal color flow and spectral analysis. IMPRESSION: 1. No evidence of DVT. Electronically signed by: Luke Bun MD 11/26/2023 08:46 PM EST RP Workstation: [id number]   DG Ankle Complete  Right Result Date: 11/26/2023 CLINICAL DATA:  Right ankle swelling EXAM: RIGHT ANKLE - COMPLETE 3+ VIEW COMPARISON:  None Available. FINDINGS: No acute fracture or dislocation. No ankle mortise widening. The talar dome is intact. There is no evidence of arthropathy or other focal bone abnormality. Small undersurface calcaneal heel spur. Moderate soft tissue swelling about the ankle. IMPRESSION: Moderate soft tissue swelling about the ankle. No acute fracture or dislocation. Electronically Signed   By: Rogelia Myers M.D.   On: 11/26/2023 16:59     Procedures   Medications Ordered in the ED  ibuprofen  (ADVIL ) tablet 600 mg (600 mg Oral Given 11/26/23 2131)                                    Medical Decision Making This patient presents to the ED for concern of ankle pain, this involves an extensive number  of treatment options, and is a complaint that carries with it a high risk of complications and morbidity.  The differential diagnosis includes fracture, dislocation, benign soft tissue swelling, ligamentous/tendinous injury, DVT   Co morbidities that complicate the patient evaluation  Hypertension, diabetes, CHF, CAD   Additional history obtained:  Additional history obtained from record review External records from outside source obtained and reviewed including prior PCP note    Imaging Studies ordered:  I ordered imaging studies including R ankle XR, RLE DVT US  I independently visualized and interpreted imaging which showed  - XR: Moderate soft tissue swelling about the ankle. No acute fracture or dislocation. - US : 1. No evidence of DVT. I agree with the radiologist interpretation   Cardiac Monitoring: / EKG:  The patient was maintained on a cardiac monitor.  I personally viewed and interpreted the cardiac monitored which showed an underlying rhythm of: NSR    Problem List / ED Course / Critical interventions / Medication management I have reviewed the patients home  medicines and have made adjustments as needed   Social Determinants of Health:  Housing instability   Test / Admission - Considered:  Physical exam is notable as above, patient does have soft tissue swelling of the ankle with tenderness to palpation of the medial/lateral malleolus as well as the region of the Achilles tendon, x-ray is unremarkable as above, no evidence of fracture/dislocation.  Given unilateral swelling there is a concern for DVT formation, although my suspicion is low given that swelling is localized to the ankle alone I will evaluate this further with an ultrasound.  Ultrasound imaging is unremarkable as above, negative for DVT.  I do not have a suspicion for septic joint given that there is no erythema/warmth noted to the ankle, she is afebrile and otherwise shows no systemic signs of illness.  I suspect a tendinous/ligamentous injury, I recommend that she continue to rest/ice/elevate her ankle and use an Ace wrap as needed for comfort.  I have provided her with contact information for an orthopedic specialist to schedule follow-up in the event that her symptoms persist, in the meantime she may continue Tylenol /ibuprofen .  She voiced understanding and is in agreement with this plan.  Return precautions discussed.  She is appropriate for discharge at this time.    Amount and/or Complexity of Data Reviewed Radiology: ordered.        Final diagnoses:  Acute right ankle pain    ED Discharge Orders     None          Glendia Rocky SAILOR, [place] 11/26/23 2336    Pamella Ozell LABOR, DO 11/27/23 1659

## 2023-12-02 ENCOUNTER — Encounter: Payer: Self-pay | Admitting: Podiatry

## 2023-12-02 ENCOUNTER — Ambulatory Visit

## 2023-12-02 ENCOUNTER — Ambulatory Visit: Admitting: Family

## 2023-12-02 ENCOUNTER — Ambulatory Visit (INDEPENDENT_AMBULATORY_CARE_PROVIDER_SITE_OTHER): Admitting: Podiatry

## 2023-12-02 DIAGNOSIS — M7671 Peroneal tendinitis, right leg: Secondary | ICD-10-CM

## 2023-12-02 DIAGNOSIS — M7751 Other enthesopathy of right foot: Secondary | ICD-10-CM | POA: Diagnosis not present

## 2023-12-02 MED ORDER — TRIAMCINOLONE ACETONIDE 10 MG/ML IJ SUSP
10.0000 mg | Freq: Once | INTRAMUSCULAR | Status: AC
  Administered 2023-12-02: 10 mg via INTRA_ARTICULAR

## 2023-12-02 MED ORDER — PREDNISONE 10 MG PO TABS: ORAL_TABLET | ORAL | 0 refills | Status: DC

## 2023-12-02 NOTE — Progress Notes (Signed)
 Subjective:   Patient ID: Amber [name] Polio, female   DOB: 63 y.o.   MRN: [id number]   HPI States over the last 8 days she has developed severe discomfort in her right ankle right back outside of her foot and the inside of the foot.  States has been very tender and states has been making increasingly shoe gear difficult   ROS      Objective:  Physical Exam  Neurovascular status intact severe discomfort around the peroneal tendon groove right and around the right ankle capsule with fluid buildup and pain with palpation     Assessment:  Inflammatory capsulitis peroneal tendinitis and inflammation of the medial ankle also noted     Plan:  H&P x-rays reviewed sterile prep injected the peroneal tendon groove and into the lateral ankle gutter and 40 mg dexamethasone  Kenalog  5 mg Xylocaine  applied sterile dressing placed in ankle compression stocking and dispensed below knee air fracture walker to completely immobilize due to the intensity of discomfort.  I have placed on Sterapred 12-day DS Dosepak and reappoint 2 weeks  X-rays indicate no signs of fracture no signs of bony injury associated with this intense pathology     Patient

## 2023-12-05 ENCOUNTER — Telehealth (INDEPENDENT_AMBULATORY_CARE_PROVIDER_SITE_OTHER): Payer: Self-pay | Admitting: *Deleted

## 2023-12-05 NOTE — Telephone Encounter (Signed)
 Amber [name] (Key: [id number])  This request has been approved.   PATIENT NOTIFIED VIA MYCHART.

## 2023-12-06 ENCOUNTER — Ambulatory Visit: Admitting: Family

## 2023-12-09 ENCOUNTER — Ambulatory Visit (INDEPENDENT_AMBULATORY_CARE_PROVIDER_SITE_OTHER): Payer: Self-pay | Admitting: Family Medicine

## 2023-12-16 ENCOUNTER — Ambulatory Visit (INDEPENDENT_AMBULATORY_CARE_PROVIDER_SITE_OTHER): Payer: Self-pay | Admitting: Podiatry

## 2023-12-16 DIAGNOSIS — Z91199 Patient's noncompliance with other medical treatment and regimen due to unspecified reason: Secondary | ICD-10-CM

## 2023-12-16 NOTE — Progress Notes (Signed)
Patient did not come in for appointment

## 2023-12-20 ENCOUNTER — Ambulatory Visit: Admitting: Family

## 2023-12-20 ENCOUNTER — Telehealth: Payer: Self-pay | Admitting: Family

## 2023-12-20 VITALS — BP 95/58 | HR 86 | Temp 99.0°F | Resp 16 | Ht 60.0 in | Wt 172.0 lb

## 2023-12-20 DIAGNOSIS — L304 Erythema intertrigo: Secondary | ICD-10-CM | POA: Diagnosis not present

## 2023-12-20 DIAGNOSIS — E669 Obesity, unspecified: Secondary | ICD-10-CM | POA: Diagnosis not present

## 2023-12-20 DIAGNOSIS — G5602 Carpal tunnel syndrome, left upper limb: Secondary | ICD-10-CM | POA: Diagnosis not present

## 2023-12-20 DIAGNOSIS — I10 Essential (primary) hypertension: Secondary | ICD-10-CM | POA: Diagnosis not present

## 2023-12-20 DIAGNOSIS — E119 Type 2 diabetes mellitus without complications: Secondary | ICD-10-CM | POA: Diagnosis not present

## 2023-12-20 MED ORDER — MELOXICAM 7.5 MG PO TABS: 7.5000 mg | ORAL_TABLET | Freq: Every day | ORAL | 0 refills | Status: DC

## 2023-12-20 MED ORDER — NYSTATIN 100000 UNIT/GM EX POWD: 1.0000 | Freq: Three times a day (TID) | CUTANEOUS | 1 refills | Status: AC

## 2023-12-20 MED ORDER — HYDROCHLOROTHIAZIDE 25 MG PO TABS: 25.0000 mg | ORAL_TABLET | Freq: Every day | ORAL | 0 refills | Status: DC

## 2023-12-20 MED ORDER — NYSTATIN 100000 UNIT/GM EX POWD: 1.0000 | Freq: Three times a day (TID) | CUTANEOUS | 0 refills | Status: DC

## 2023-12-20 NOTE — Assessment & Plan Note (Signed)
" °  Due for routine diabetes control monitoring. - Ordered urine test for protein. - Scheduled follow-up after new year for blood work and diabetes management assessment. "

## 2023-12-20 NOTE — Assessment & Plan Note (Signed)
" °  Numbness in left hand, middle finger, consistent with carpal tunnel syndrome. Likely median nerve compression. - Prescribed meloxicam  for inflammation. - Advised wearing wrist brace at night. - Follow-up in a month if no improvement for potential specialist referral.  "

## 2023-12-20 NOTE — Telephone Encounter (Signed)
 Please advise pt that her blood pressure is going down on the Mounjaro . I would like to change her from triamterene -hydrochlorothiazide  to plain hydrochlorothiazide . Send me some updated readings in 1 week.

## 2023-12-20 NOTE — Progress Notes (Signed)
 "  Subjective:     Patient ID: Amber [name], female    DOB: November 15, 1960, 63 y.o.   MRN: [id number]  Chief Complaint  Patient presents with   Numbness    Patient reports numbness, fingers left hand    HPI  Discussed the use of AI scribe software for clinical note transcription with the patient, who gave verbal consent to proceed.  History of Present Illness Amber [name] is a 63 year old female who presents with numbness in her left hand.  She has been experiencing numbness primarily in her left hand, specifically affecting the middle finger and occasionally the entire hand. The numbness has persisted for about three weeks. She denies any recent injury to the hand or fingers. The symptoms began while she was at home, not exposed to cold weather, during a period when she was off work from the week before Thanksgiving until December 5th.  She describes a previous episode where she was unable to walk due to pain in her right leg, knee, and ankle, which led to an emergency room visit. She was kept in the ER for several hours but did not receive any specific treatment during that visit.  She also mentions a skin issue under her right breast, which is not itchy and was previously evaluated with negative results for other conditions.  No itching under the left breast and no recent injuries to her left hand or fingers.     Health Maintenance Due  Topic Date Due   COVID-19 Vaccine (6 - 2025-26 season) 09/02/2023   Pneumococcal Vaccine: 50+ Years (3 of 3 - PCV20 or PCV21) 09/10/2023   Diabetic kidney evaluation - Urine ACR  12/05/2023    Past Medical History:  Diagnosis Date   Anemia    Arthritis    Asthma 05/03/2020   Back pain    Blood transfusion without reported diagnosis    CAD (coronary artery disease) 04/24/2017   Cataract    Cerebrovascular disease 10/13/2020   Chronic diastolic CHF (congestive heart failure) (HCC) 04/24/2017   Colon polyps    Controlled type 2  diabetes mellitus without complication, without long-term current use of insulin  (HCC) 05/31/2017   Diabetic peripheral neuropathy (HCC) 01/05/2020   Ectopic pregnancy    RIGHT salpingostomy   Elective abortion    Elective abortion 09/26/2020   RIGHT salpingostomy   Essential hypertension 09/04/2017   GERD (gastroesophageal reflux disease)    Glaucoma    both eyes   Headache    migraines   History of chicken pox    History of chicken pox 09/26/2020   migraines   Hyperlipemia    Hypertension    Iron  deficiency anemia 09/02/2013   Joint pain    Migraine 04/19/2014   Obesity (BMI 30-39.9) 10/17/2012   Palpitations 10/01/2012   Pre-diabetes    Rheumatoid arthritis (HCC) 11/07/2011   Rheumatoid arthritis(714.0) 11/07/2011   SAB (spontaneous abortion)    SAB (spontaneous abortion) 11/07/2011   Shortness of breath    Sjogren's disease    Swelling of lower extremity    Vitamin B12 deficiency    Wheezing     Past Surgical History:  Procedure Laterality Date   ABDOMINAL SURGERY     bowel repair   CATARACT EXTRACTION     02/25/2023, 03/11/2023   COLONOSCOPY     COLONOSCOPY  08/2022   DILATION AND CURETTAGE OF UTERUS     X 2   KNEE ARTHROSCOPY W/ MENISCAL REPAIR Right 01/16/2023  MENISCUS REPAIR Right    MENISCUS TEAR REPAIR   MYOMECTOMY N/A 07/27/2014   Procedure: ABDOMINAL MYOMECTOMY;  Surgeon: Jerolyn Foil, MD;  Location: [facility];  Service: Gynecology;  Laterality: N/A;   PELVIC LAPAROSCOPY  1994   IN 1999 LAPAROSCOPY WITH INCIDENTAL ENTEROTOMY REQUIRING LAPAROTOMY   ROTATOR CUFF REPAIR Left 06/21/2021   UTERINE FIBROID SURGERY     July 2016    Family History  Problem Relation Age of Onset   Hypertension Mother        died from heart disease   Heart disease Mother        deceased, unknown cause   Stroke Mother    Obesity Mother    High Cholesterol Mother    Sudden death Father    Diabetes Sister    Hypertension Sister    Esophageal cancer Sister    Stomach  cancer Sister    Cancer Sister    Diabetes Sister    Parkinson's disease Brother    Rectal cancer Neg Hx    Colon cancer Neg Hx    Colon polyps Neg Hx    Breast cancer Neg Hx     Social History   Socioeconomic History   Marital status: Single    Spouse name: Not on file   Number of children: 0   Years of education: college   Highest education level: Associate degree: occupational, scientist, product/process development, or vocational program  Occupational History   Occupation: LPN    Employer: [employer]  Tobacco Use   Smoking status: Never    Passive exposure: Never   Smokeless tobacco: Never  Vaping Use   Vaping status: Never Used  Substance and Sexual Activity   Alcohol use: No    Comment: occasional   Drug use: No   Sexual activity: Yes    Birth control/protection: None    Comment: 1st intercourse- 17, partners- 5  Other Topics Concern   Not on file  Social History Narrative   Regular exercise:  No   Caffeine Use: 1 cup coffee daily   No children   Happily divorced   Reports that she is involved at her church   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  Had a sister up in [place]  who passed away 01/03/2016 who she was close to.   Born in [place]- she came to [place]  as adult.  Age 68.    Left-handed.         Social Drivers of Health   Tobacco Use: Low Risk (12/02/2023)   Patient History    Smoking Tobacco Use: Never    Smokeless Tobacco Use: Never    Passive Exposure: Never  Financial Resource Strain: Medium Risk (11/27/2023)   Overall Financial Resource Strain (CARDIA)    Difficulty of Paying Living Expenses: Somewhat hard  Food Insecurity: Food Insecurity Present (11/27/2023)   Epic    Worried About Programme Researcher, Broadcasting/film/video in the Last Year: Sometimes true    Ran Out of Food in the Last Year: Sometimes true  Transportation Needs: No Transportation Needs (11/27/2023)   Epic    Lack of Transportation (Medical): No    Lack of Transportation (Non-Medical): No  Physical  Activity: Inactive (01/25/2023)   Exercise Vital Sign    Days of Exercise per Week: 0 days    Minutes of Exercise per Session: 20 min  Stress: No Stress Concern Present (01/25/2023)   [employer] of Occupational Health - Occupational Stress Questionnaire    Feeling  of Stress : Not at all  Social Connections: Moderately Integrated (11/27/2023)   Social Connection and Isolation Panel    Frequency of Communication with Friends and Family: More than three times a week    Frequency of Social Gatherings with Friends and Family: Once a week    Attends Religious Services: More than 4 times per year    Active Member of [employer] or Organizations: Yes    Attends Banker Meetings: Not on file    Marital Status: Divorced  Intimate Partner Violence: Unknown (04/04/2021)   Received from [facility]   HITS    Physically Hurt: Not on file    Insult or Talk Down To: Not on file    Threaten Physical Harm: Not on file    Scream or Curse: Not on file  Depression (PHQ2-9): Low Risk (08/06/2023)   Depression (PHQ2-9)    PHQ-2 Score: 0  Alcohol Screen: Not on file  Housing: Low Risk (11/27/2023)   Epic    Unable to Pay for Housing in the Last Year: No    Number of Times Moved in the Last Year: 0    Homeless in the Last Year: No  Utilities: Not on file  Health Literacy: Not on file    Outpatient Medications Prior to Visit  Medication Sig Dispense Refill   albuterol  (VENTOLIN  HFA) 108 (90 Base) MCG/ACT inhaler Inhale 2 puffs into the lungs every 6 (six) hours as needed. 18 g 0   Aspirin  81 MG CAPS 81 mg.     Blood Glucose Monitoring Suppl DEVI 1 each by Does not apply route in the morning, at noon, and at bedtime. May substitute to any manufacturer covered by patient's insurance. 1 each 0   brimonidine-timolol (COMBIGAN) 0.2-0.5 % ophthalmic solution      calcium -vitamin D  (OSCAL WITH D) 500-200 MG-UNIT TABS tablet Take by mouth.     carvedilol  (COREG ) 12.5 MG tablet Take 1 tablet (12.5  mg total) by mouth 2 (two) times daily with a meal. 180 tablet 1   cyanocobalamin  (VITAMIN B12) 1000 MCG/ML injection 1000mcg IM weekly x 4 weeks then once monthly 12 mL 0   diltiazem  (CARTIA  XT) 240 MG 24 hr capsule Take 1 capsule (240 mg total) by mouth daily. 90 capsule 1   fluticasone  (FLONASE ) 50 MCG/ACT nasal spray Place 2 sprays into both nostrils daily. 16 g 1   gabapentin  (NEURONTIN ) 100 MG capsule Take 1 capsule (100 mg total) by mouth at bedtime. 90 capsule 1   ibuprofen  (ADVIL ) 200 MG tablet Take 200 mg by mouth every 6 (six) hours as needed.     Iron , Ferrous Sulfate , 325 (65 Fe) MG TABS Take 325 mg by mouth 2 (two) times daily. 30 tablet    Lancets (ONETOUCH DELICA PLUS LANCET33G) MISC Apply 1 each topically daily.     ORENCIA  CLICKJECT 125 MG/ML SOAJ INJECT 125 MG UNDER THE SKIN EVERY 7 DAYS 4 mL 1   polyethylene glycol (MIRALAX  / GLYCOLAX ) 17 g packet Take 17 g by mouth 2 (two) times daily. Until stooling regularly. Then once daily or prn     predniSONE  (DELTASONE ) 10 MG tablet 12 day tapering dose 48 tablet 0   rosuvastatin  (CRESTOR ) 10 MG tablet Take 1 tablet (10 mg total) by mouth daily. 90 tablet 1   SUMAtriptan  (IMITREX ) 100 MG tablet Take 1 tablet (100 mg total) by mouth as needed for migraine. May repeat in 2 hours if headache persists or recurs. 30 tablet 5  Syringe/Needle, Disp, (SYRINGE 3CC/22GX1-1/2) 22G X 1-1/2 3 ML MISC Use as directed 50 each 0   timolol (TIMOPTIC) 0.5 % ophthalmic solution 1 drop every morning.     tirzepatide  (MOUNJARO ) 10 MG/0.5ML Pen Inject 10 mg into the skin once a week. 2 mL 0   TRAVATAN Z 0.004 % SOLN ophthalmic solution Place 1 drop into both eyes at bedtime.      valsartan  (DIOVAN ) 320 MG tablet Take 1 tablet (320 mg total) by mouth daily. 90 tablet 1   Vitamin D , Ergocalciferol , (DRISDOL ) 1.25 MG (50000 UNIT) CAPS capsule Take 1 capsule (50,000 Units total) by mouth every 7 (seven) days. 13 capsule 0   triamterene -hydrochlorothiazide   (MAXZIDE-25) 37.5-25 MG tablet Take 1 tablet by mouth daily. 90 tablet 1   No facility-administered medications prior to visit.    Allergies[1]  ROS See HPI    Objective:    Physical Exam Constitutional:      General: She is not in acute distress.    Appearance: Normal appearance. She is well-developed.  HENT:     Head: Normocephalic and atraumatic.     Right Ear: External ear normal.     Left Ear: External ear normal.  Eyes:     General: No scleral icterus. Neck:     Thyroid : No thyromegaly.  Cardiovascular:     Rate and Rhythm: Normal rate and regular rhythm.     Heart sounds: Normal heart sounds. No murmur heard. Pulmonary:     Effort: Pulmonary effort is normal. No respiratory distress.     Breath sounds: Normal breath sounds. No wheezing.  Musculoskeletal:     Cervical back: Neck supple.  Skin:    General: Skin is warm and dry.     Comments: Fungal rash noted beneath the right breast  Neurological:     Mental Status: She is alert and oriented to person, place, and time.     Comments: + tinels left  Psychiatric:        Mood and Affect: Mood normal.        Behavior: Behavior normal.        Thought Content: Thought content normal.        Judgment: Judgment normal.      BP (!) 95/58 (BP Location: Right Arm, Patient Position: Sitting, Cuff Size: Large)   Pulse 86   Temp 99 F (37.2 C) (Oral)   Resp 16   Ht 5' (1.524 m)   Wt 172 lb (78 kg)   LMP 08/09/2016   SpO2 100%   BMI 33.59 kg/m  Wt Readings from Last 3 Encounters:  12/20/23 172 lb (78 kg)  11/11/23 171 lb (77.6 kg)  10/15/23 179 lb (81.2 kg)       Assessment & Plan:   Problem List Items Addressed This Visit       Unprioritized   Intertrigo   New.  - Prescribed antifungal powder. - Instructed to keep area dry, use hairdryer after showering, apply powder before bed. - Follow-up if no improvement in a few weeks.       Relevant Medications   nystatin  (MYCOSTATIN /NYSTOP ) powder    Essential hypertension   Appears overtreated.  She has lost some weight on Mounjaro . Will change triamterene -hydrochlorothiazide  to plain hydrochlorothiazide .        Relevant Medications   hydrochlorothiazide  (HYDRODIURIL ) 25 MG tablet   Controlled type 2 diabetes mellitus without complication, without long-term current use of insulin  (HCC)    Due for routine diabetes control monitoring. -  Ordered urine test for protein. - Scheduled follow-up after new year for blood work and diabetes management assessment.      Carpal tunnel syndrome on left    Numbness in left hand, middle finger, consistent with carpal tunnel syndrome. Likely median nerve compression. - Prescribed meloxicam  for inflammation. - Advised wearing wrist brace at night. - Follow-up in a month if no improvement for potential specialist referral.       Relevant Medications   meloxicam  (MOBIC ) 7.5 MG tablet   Other Visit Diagnoses       Type 2 diabetes mellitus in patient with obesity (HCC)    -  Primary   Relevant Orders   Urine Microalbumin w/creat. ratio      Assessment & Plan     I have discontinued Cresta D. Nudelman's triamterene -hydrochlorothiazide  and nystatin . I am also having her start on nystatin , meloxicam , and hydrochlorothiazide . Additionally, I am having her maintain her Travatan Z, calcium -vitamin D , Iron  (Ferrous Sulfate ), fluticasone , albuterol , SUMAtriptan , ibuprofen , timolol, brimonidine-timolol, SYRINGE 3CC/22GX1-1/2, Aspirin , Blood Glucose Monitoring Suppl, OneTouch Delica Plus Lancet33G, carvedilol , gabapentin , cyanocobalamin , valsartan , diltiazem , rosuvastatin , Orencia  ClickJect, polyethylene glycol, tirzepatide , Vitamin D  (Ergocalciferol ), and predniSONE .  Meds ordered this encounter  Medications   DISCONTD: nystatin  (MYCOSTATIN /NYSTOP ) powder    Sig: Apply 1 Application topically 3 (three) times daily.    Dispense:  15 g    Refill:  0    Supervising Provider:   DOMENICA BLACKBIRD A  [[id number]]   nystatin  (MYCOSTATIN /NYSTOP ) powder    Sig: Apply 1 Application topically 3 (three) times daily.    Dispense:  30 g    Refill:  1    Please cancel rx for 15 gram and fill this 30 gram instead    Supervising Provider:   DOMENICA BLACKBIRD A [[id number]]   meloxicam  (MOBIC ) 7.5 MG tablet    Sig: Take 1 tablet (7.5 mg total) by mouth daily.    Dispense:  14 tablet    Refill:  0    Supervising Provider:   DOMENICA BLACKBIRD A [[id number]]   hydrochlorothiazide  (HYDRODIURIL ) 25 MG tablet    Sig: Take 1 tablet (25 mg total) by mouth daily.    Dispense:  90 tablet    Refill:  0    Supervising Provider:   DOMENICA BLACKBIRD A [[id number]]      [1]  Allergies Allergen Reactions   Gabapentin      Made her crazy   "

## 2023-12-20 NOTE — Assessment & Plan Note (Signed)
 Appears overtreated.  She has lost some weight on Mounjaro . Will change triamterene -hydrochlorothiazide  to plain hydrochlorothiazide .

## 2023-12-20 NOTE — Assessment & Plan Note (Signed)
 New.  - Prescribed antifungal powder. - Instructed to keep area dry, use hairdryer after showering, apply powder before bed. - Follow-up if no improvement in a few weeks.

## 2023-12-20 NOTE — Patient Instructions (Signed)
" °  VISIT SUMMARY: Today, you were seen for numbness in your left hand, which has been affecting your middle finger and occasionally your entire hand for the past three weeks. We also discussed a skin issue under your right breast and your ongoing management of type 2 diabetes.  YOUR PLAN: -CARPAL TUNNEL SYNDROME, LEFT UPPER LIMB: Carpal tunnel syndrome is caused by compression of the median nerve in your wrist, leading to numbness and tingling in your hand. You have been prescribed meloxicam  to reduce inflammation and advised to wear a wrist brace at night. Please follow up in a month if there is no improvement, as you may need a referral to a specialist.  -INTERTRIGO: Intertrigo is a rash that occurs in skin folds due to moisture and friction. You have been prescribed an antifungal powder to apply to the affected area. Keep the area dry, use a hairdryer after showering, and apply the powder before bed. Follow up if there is no improvement in a few weeks.  -TYPE 2 DIABETES MELLITUS: Type 2 diabetes is a condition where your body does not use insulin  properly, leading to high blood sugar levels. You are due for routine diabetes control monitoring, including a urine test for protein. We will schedule a follow-up after the new year for blood work and to assess your diabetes management.  INSTRUCTIONS: Please follow up in a month if your carpal tunnel syndrome symptoms do not improve. Additionally, follow up in a few weeks if there is no improvement in your intertrigo. We will schedule a follow-up after the new year for your diabetes management and blood work.                    "

## 2023-12-21 LAB — MICROALBUMIN / CREATININE URINE RATIO
Creatinine, Urine: 105 mg/dL (ref 20–275)
Microalb Creat Ratio: 6 mg/g{creat}
Microalb, Ur: 0.6 mg/dL

## 2023-12-23 ENCOUNTER — Ambulatory Visit: Payer: Self-pay | Admitting: Family

## 2023-12-23 NOTE — Telephone Encounter (Signed)
 Patient notified of medication change and to send a report with her new readings.

## 2023-12-24 ENCOUNTER — Encounter (INDEPENDENT_AMBULATORY_CARE_PROVIDER_SITE_OTHER): Payer: Self-pay | Admitting: Adult Health

## 2023-12-24 ENCOUNTER — Ambulatory Visit (INDEPENDENT_AMBULATORY_CARE_PROVIDER_SITE_OTHER): Admitting: Adult Health

## 2023-12-24 VITALS — BP 88/65 | HR 63 | Temp 98.5°F | Ht 60.0 in | Wt 166.0 lb

## 2023-12-24 DIAGNOSIS — E1159 Type 2 diabetes mellitus with other circulatory complications: Secondary | ICD-10-CM | POA: Diagnosis not present

## 2023-12-24 DIAGNOSIS — Z6832 Body mass index (BMI) 32.0-32.9, adult: Secondary | ICD-10-CM

## 2023-12-24 DIAGNOSIS — E669 Obesity, unspecified: Secondary | ICD-10-CM | POA: Diagnosis not present

## 2023-12-24 DIAGNOSIS — E559 Vitamin D deficiency, unspecified: Secondary | ICD-10-CM

## 2023-12-24 DIAGNOSIS — Z7985 Long-term (current) use of injectable non-insulin antidiabetic drugs: Secondary | ICD-10-CM | POA: Diagnosis not present

## 2023-12-24 DIAGNOSIS — I10 Essential (primary) hypertension: Secondary | ICD-10-CM

## 2023-12-24 DIAGNOSIS — I152 Hypertension secondary to endocrine disorders: Secondary | ICD-10-CM

## 2023-12-24 DIAGNOSIS — Z6833 Body mass index (BMI) 33.0-33.9, adult: Secondary | ICD-10-CM

## 2023-12-24 MED ORDER — VITAMIN D (ERGOCALCIFEROL) 1.25 MG (50000 UNIT) PO CAPS: 50000.0000 [IU] | ORAL_CAPSULE | ORAL | 0 refills | Status: AC

## 2023-12-24 NOTE — Progress Notes (Signed)
 "    WEIGHT SUMMARY AND BIOMETRICS  Vitals Temp: 98.5 F (36.9 C) BP: (!) 88/65 Pulse Rate: 63 SpO2: 98 %   Anthropometric Measurements Height: 5' (1.524 m) Weight: 166 lb (75.3 kg) BMI (Calculated): 32.42 Weight at Last Visit: 172lb Weight Lost Since Last Visit: 6lb Weight Gained Since Last Visit: 0lb Starting Weight: 214lb Total Weight Loss (lbs): 48 lb (21.8 kg) Peak Weight: 218lb   Body Composition  Body Fat %: 44.5 % Fat Mass (lbs): 74.2 lbs Muscle Mass (lbs): 87.8 lbs Total Body Water (lbs): 64 lbs Visceral Fat Rating : 12   Other Clinical Data RMR: [id number] Fasting: No Labs: No Today's Visit #: 11 Starting Date: 12/05/22    Chief Complaint:   OBESITY Amber [name] is here to discuss her progress with her obesity treatment plan.  She is on the the Category 2 Plan and states she is following her eating plan approximately 50 % of the time.  She states she is exercising: None  Interim History:  12/20/2023 Chronic f/u with PCP Soft BP with positional dizziness PCP continued on: Coreg  12.5mg  BID, Cardizem  240mg  daily, Diovan  320mg  daily She is also on weekly Mounjaro  10mg - Mounjaro  typically reduces systolic blood pressure by 7-10 mmHg and diastolic blood pressure by 4-5 mmHg  She has not been exercising due to acute R knee and acute R ankle pain.  She will start a new job on 12/30/23- congratulations!  Subjective:   1. Hypertension associated with type 2 diabetes mellitus (HCC) BP has been trending down BP soft today and recent OV with PCP PCP stopped triamterene -hydrochlorothiazide  and started plain hydrochlorothiazide .   PCP continued on: Coreg  12.5mg  BID, Cardizem  240mg  daily, Diovan  320mg  daily She is also on weekly Mounjaro  10mg - Mounjaro  typically reduces systolic blood pressure by 7-10 mmHg and diastolic blood pressure by 4-5 mmHg  2. Type 2 diabetes mellitus in patient with obesity (HCC) She is on Mounjaro  10mg  once weekly Denies mass in  neck, dysphagia, dyspepsia, persistent hoarseness, abdominal pain, or N/V/C  She denies sx's of hypoglycemia She has not been checking home CBG  Lab Results  Component Value Date   HGBA1C 6.1 08/06/2023   HGBA1C 6.4 04/26/2023   HGBA1C 6.4 01/25/2023    3. Vitamin D  insufficiency  Latest Reference Range & Units 08/06/23 10:24  VITD 30.00 - 100.00 ng/mL 34.49   She is on weekly Ergocalciferol - denies NV/Muscle Weakness  Assessment/Plan:   1. Hypertension associated with type 2 diabetes mellitus (HCC) (Primary) Remain well hydrated  Monitor home BP and sx's of hypotension- contact PCP with concerns  2. Type 2 diabetes mellitus in patient with obesity (HCC) Check Labs - Hemoglobin A1c MyChart pt after A1c results and refill Mounjaro  accordingly. If A1c <5.8, reduce Mounjaro  10mg  to 7.5mg - also in light of BP trending down  3. Vitamin D  insufficiency Check Labs - VITAMIN D  25 Hydroxy (Vit-D Deficiency, Fractures) Refill - Vitamin D , Ergocalciferol , (DRISDOL ) 1.25 MG (50000 UNIT) CAPS capsule; Take 1 capsule (50,000 Units total) by mouth every 7 (seven) days.  Dispense: 13 capsule; Refill: 0  4. BMI 33.0-33.9,adult BMI 33.8 Current BMI 32.6  Tenelle is currently in the action stage of change. As such, her goal is to continue with weight loss efforts. She has agreed to the Category 2 Plan.   Exercise goals: No exercise has been prescribed at this time.  Behavioral modification strategies: increasing lean protein intake, decreasing simple carbohydrates, increasing vegetables, increasing water intake, no skipping meals, meal planning and cooking  strategies, keeping healthy foods in the home, ways to avoid boredom eating, holiday eating strategies , celebration eating strategies, and planning for success.  Niza has agreed to follow-up with our clinic in 4 weeks. She was informed of the importance of frequent follow-up visits to maximize her success with intensive lifestyle  modifications for her multiple health conditions.   Davina was informed we would discuss her lab results at her next visit unless there is a critical issue that needs to be addressed sooner. Shanan agreed to keep her next visit at the agreed upon time to discuss these results.  Objective:   Blood pressure (!) 88/65, pulse 63, temperature 98.5 F (36.9 C), height 5' (1.524 m), weight 166 lb (75.3 kg), last menstrual period 08/09/2016, SpO2 98%. Body mass index is 32.42 kg/m.  General: Cooperative, alert, well developed, in no acute distress. HEENT: Conjunctivae and lids unremarkable. Cardiovascular: Regular rhythm.  Lungs: Normal work of breathing. Neurologic: No focal deficits.   Lab Results  Component Value Date   CREATININE 0.63 09/24/2023   BUN 10 09/24/2023   NA 142 09/24/2023   K 3.9 09/24/2023   CL 107 09/24/2023   CO2 26 09/24/2023   Lab Results  Component Value Date   ALT 22 09/24/2023   AST 21 09/24/2023   ALKPHOS 56 04/26/2023   BILITOT 0.9 09/24/2023   Lab Results  Component Value Date   HGBA1C 6.1 08/06/2023   HGBA1C 6.4 04/26/2023   HGBA1C 6.4 01/25/2023   HGBA1C 6.7 (H) 10/17/2022   HGBA1C 6.7 (H) 07/17/2022   Lab Results  Component Value Date   INSULIN  17.5 12/05/2022   INSULIN  16.5 09/04/2017   Lab Results  Component Value Date   TSH 1.560 12/05/2022   Lab Results  Component Value Date   CHOL 120 12/05/2022   HDL 54 12/05/2022   LDLCALC 49 12/05/2022   TRIG 85 12/05/2022   CHOLHDL 3 07/17/2022   Lab Results  Component Value Date   VD25OH 34.49 08/06/2023   VD25OH 29.4 (L) 12/05/2022   VD25OH 37.2 09/04/2017   Lab Results  Component Value Date   WBC 2.7 (L) 09/24/2023   HGB 12.5 09/24/2023   HCT 39.2 09/24/2023   MCV 86.7 09/24/2023   PLT 212 09/24/2023   Lab Results  Component Value Date   IRON  92 12/05/2022   TIBC 303 12/05/2022   FERRITIN 120 12/05/2022   Attestation Statements:   Reviewed by clinician on day of  visit: allergies, medications, problem list, medical history, surgical history, family history, social history, and previous encounter notes.  I have reviewed the above documentation for accuracy and completeness, and I agree with the above. -  Darlisa Spruiell d. Shedric Fredericks, NP-C "

## 2023-12-25 ENCOUNTER — Ambulatory Visit: Payer: Self-pay | Admitting: Podiatry

## 2023-12-25 ENCOUNTER — Encounter: Payer: Self-pay | Admitting: Podiatry

## 2023-12-25 ENCOUNTER — Encounter: Payer: Self-pay | Admitting: Oncology

## 2023-12-25 DIAGNOSIS — M7751 Other enthesopathy of right foot: Secondary | ICD-10-CM

## 2023-12-25 DIAGNOSIS — M722 Plantar fascial fibromatosis: Secondary | ICD-10-CM | POA: Diagnosis not present

## 2023-12-25 LAB — VITAMIN D 25 HYDROXY (VIT D DEFICIENCY, FRACTURES): Vit D, 25-Hydroxy: 43.2 ng/mL (ref 30.0–100.0)

## 2023-12-25 LAB — HEMOGLOBIN A1C
Est. average glucose Bld gHb Est-mCnc: 114 mg/dL
Hgb A1c MFr Bld: 5.6 % (ref 4.8–5.6)

## 2023-12-25 MED ORDER — TRIAMCINOLONE ACETONIDE 10 MG/ML IJ SUSP
10.0000 mg | Freq: Once | INTRAMUSCULAR | Status: AC
  Administered 2023-12-25: 10 mg via INTRA_ARTICULAR

## 2023-12-25 NOTE — Progress Notes (Signed)
 Subjective:   Patient ID: Amber [name], female   DOB: 63 y.o.   MRN: [id number]   HPI Patient presents with improvement in the pain outside of the right ankle but has a lot of pain now underneath the right heel and inside of the ankle with moderate gait instability   ROS      Objective:  Physical Exam  Vascular status intact inflammation pain of the plantar fascia right and into posterior tibial tendon with the lateral tendon muscles doing much better lateral joint doing better     Assessment:  Acute fasciitis right possible posterior tibial tendinitis right work compensation improvement lateral     Plan:  H&P reviewed I have recommended long-term orthotics and patient is casted for functional orthotic devices to lift the arches up and take stress off her feet with multiple points of pain and did sterile prep and injected the fascia right 3 mg Kenalog  5 mg Xylocaine  and applied sterile dressing

## 2024-01-03 ENCOUNTER — Encounter (INDEPENDENT_AMBULATORY_CARE_PROVIDER_SITE_OTHER): Payer: Self-pay | Admitting: Adult Health

## 2024-01-06 ENCOUNTER — Encounter (INDEPENDENT_AMBULATORY_CARE_PROVIDER_SITE_OTHER): Payer: Self-pay | Admitting: Adult Health

## 2024-01-06 DIAGNOSIS — E669 Obesity, unspecified: Secondary | ICD-10-CM

## 2024-01-06 MED ORDER — TIRZEPATIDE 7.5 MG/0.5ML ~~LOC~~ SOAJ: 7.5000 mg | SUBCUTANEOUS | 0 refills | Status: AC

## 2024-01-17 ENCOUNTER — Telehealth: Payer: Self-pay | Admitting: Podiatry

## 2024-01-17 ENCOUNTER — Other Ambulatory Visit: Payer: Self-pay

## 2024-01-17 ENCOUNTER — Ambulatory Visit: Payer: Self-pay

## 2024-01-17 ENCOUNTER — Encounter (HOSPITAL_BASED_OUTPATIENT_CLINIC_OR_DEPARTMENT_OTHER): Payer: Self-pay | Admitting: Emergency Medicine

## 2024-01-17 ENCOUNTER — Emergency Department (HOSPITAL_BASED_OUTPATIENT_CLINIC_OR_DEPARTMENT_OTHER)

## 2024-01-17 ENCOUNTER — Inpatient Hospital Stay (HOSPITAL_BASED_OUTPATIENT_CLINIC_OR_DEPARTMENT_OTHER)
Admission: EM | Admit: 2024-01-17 | Discharge: 2024-01-21 | DRG: 175 | Disposition: A | Discharge status: Home / Self Care | Attending: Internal Medicine | Admitting: Internal Medicine

## 2024-01-17 DIAGNOSIS — Z1152 Encounter for screening for COVID-19: Secondary | ICD-10-CM

## 2024-01-17 DIAGNOSIS — E66811 Obesity, class 1: Secondary | ICD-10-CM | POA: Diagnosis present

## 2024-01-17 DIAGNOSIS — Z860100 Personal history of colon polyps, unspecified: Secondary | ICD-10-CM

## 2024-01-17 DIAGNOSIS — J9601 Acute respiratory failure with hypoxia: Secondary | ICD-10-CM | POA: Diagnosis present

## 2024-01-17 DIAGNOSIS — Z6832 Body mass index (BMI) 32.0-32.9, adult: Secondary | ICD-10-CM

## 2024-01-17 DIAGNOSIS — Z833 Family history of diabetes mellitus: Secondary | ICD-10-CM

## 2024-01-17 DIAGNOSIS — R509 Fever, unspecified: Secondary | ICD-10-CM | POA: Diagnosis present

## 2024-01-17 DIAGNOSIS — I110 Hypertensive heart disease with heart failure: Secondary | ICD-10-CM | POA: Diagnosis present

## 2024-01-17 DIAGNOSIS — Z8249 Family history of ischemic heart disease and other diseases of the circulatory system: Secondary | ICD-10-CM

## 2024-01-17 DIAGNOSIS — Z79899 Other long term (current) drug therapy: Secondary | ICD-10-CM

## 2024-01-17 DIAGNOSIS — Z800 Family history of malignant neoplasm of digestive organs: Secondary | ICD-10-CM

## 2024-01-17 DIAGNOSIS — E785 Hyperlipidemia, unspecified: Secondary | ICD-10-CM | POA: Diagnosis present

## 2024-01-17 DIAGNOSIS — E1142 Type 2 diabetes mellitus with diabetic polyneuropathy: Secondary | ICD-10-CM | POA: Diagnosis present

## 2024-01-17 DIAGNOSIS — I2510 Atherosclerotic heart disease of native coronary artery without angina pectoris: Secondary | ICD-10-CM | POA: Diagnosis present

## 2024-01-17 DIAGNOSIS — I2609 Other pulmonary embolism with acute cor pulmonale: Principal | ICD-10-CM | POA: Diagnosis present

## 2024-01-17 DIAGNOSIS — M069 Rheumatoid arthritis, unspecified: Secondary | ICD-10-CM | POA: Diagnosis present

## 2024-01-17 DIAGNOSIS — Z7982 Long term (current) use of aspirin: Secondary | ICD-10-CM

## 2024-01-17 DIAGNOSIS — G4733 Obstructive sleep apnea (adult) (pediatric): Secondary | ICD-10-CM | POA: Diagnosis present

## 2024-01-17 DIAGNOSIS — Z888 Allergy status to other drugs, medicaments and biological substances status: Secondary | ICD-10-CM

## 2024-01-17 DIAGNOSIS — Z9842 Cataract extraction status, left eye: Secondary | ICD-10-CM

## 2024-01-17 DIAGNOSIS — I10 Essential (primary) hypertension: Secondary | ICD-10-CM

## 2024-01-17 DIAGNOSIS — Z823 Family history of stroke: Secondary | ICD-10-CM

## 2024-01-17 DIAGNOSIS — M3500 Sicca syndrome, unspecified: Secondary | ICD-10-CM | POA: Diagnosis present

## 2024-01-17 DIAGNOSIS — I2699 Other pulmonary embolism without acute cor pulmonale: Secondary | ICD-10-CM | POA: Diagnosis present

## 2024-01-17 DIAGNOSIS — Z83438 Family history of other disorder of lipoprotein metabolism and other lipidemia: Secondary | ICD-10-CM

## 2024-01-17 DIAGNOSIS — Z820 Family history of epilepsy and other diseases of the nervous system: Secondary | ICD-10-CM

## 2024-01-17 DIAGNOSIS — Z9841 Cataract extraction status, right eye: Secondary | ICD-10-CM

## 2024-01-17 DIAGNOSIS — Z7985 Long-term (current) use of injectable non-insulin antidiabetic drugs: Secondary | ICD-10-CM

## 2024-01-17 DIAGNOSIS — R0781 Pleurodynia: Secondary | ICD-10-CM

## 2024-01-17 DIAGNOSIS — I5032 Chronic diastolic (congestive) heart failure: Secondary | ICD-10-CM | POA: Diagnosis present

## 2024-01-17 LAB — CBC
HCT: 39.6 % (ref 36.0–46.0)
Hemoglobin: 12.7 g/dL (ref 12.0–15.0)
MCH: 28 pg (ref 26.0–34.0)
MCHC: 32.1 g/dL (ref 30.0–36.0)
MCV: 87.2 fL (ref 80.0–100.0)
Platelets: 171 K/uL (ref 150–400)
RBC: 4.54 MIL/uL (ref 3.87–5.11)
RDW: 14.6 % (ref 11.5–15.5)
WBC: 6.8 K/uL (ref 4.0–10.5)
nRBC: 0 % (ref 0.0–0.2)

## 2024-01-17 LAB — RESP PANEL BY RT-PCR (RSV, FLU A&B, COVID)  RVPGX2
Influenza A by PCR: NEGATIVE
Influenza B by PCR: NEGATIVE
Resp Syncytial Virus by PCR: NEGATIVE
SARS Coronavirus 2 by RT PCR: NEGATIVE

## 2024-01-17 LAB — BASIC METABOLIC PANEL WITH GFR
Anion gap: 10 (ref 5–15)
BUN: 14 mg/dL (ref 8–23)
CO2: 23 mmol/L (ref 22–32)
Calcium: 9.6 mg/dL (ref 8.9–10.3)
Chloride: 102 mmol/L (ref 98–111)
Creatinine, Ser: 0.74 mg/dL (ref 0.44–1.00)
GFR, Estimated: >60 mL/min
Glucose, Bld: 86 mg/dL (ref 70–99)
Potassium: 3.7 mmol/L (ref 3.5–5.1)
Sodium: 135 mmol/L (ref 135–145)

## 2024-01-17 LAB — PRO BRAIN NATRIURETIC PEPTIDE: Pro Brain Natriuretic Peptide: 63.6 pg/mL

## 2024-01-17 LAB — TROPONIN T, HIGH SENSITIVITY
Troponin T High Sensitivity: <15 ng/L (ref 0–19)
Troponin T High Sensitivity: <15 ng/L (ref 0–19)

## 2024-01-17 LAB — D-DIMER, QUANTITATIVE: D-Dimer, Quant: 3.61 ug{FEU}/mL — ABNORMAL HIGH (ref 0.00–0.50)

## 2024-01-17 MED ORDER — ONDANSETRON HCL 4 MG/2ML IJ SOLN
4.0000 mg | Freq: Once | INTRAMUSCULAR | Status: AC
  Administered 2024-01-17: 4 mg via INTRAVENOUS
  Filled 2024-01-17: qty 2

## 2024-01-17 MED ORDER — AZITHROMYCIN 250 MG PO TABS
500.0000 mg | ORAL_TABLET | Freq: Once | ORAL | Status: AC
  Administered 2024-01-17: 500 mg via ORAL
  Filled 2024-01-17: qty 2

## 2024-01-17 MED ORDER — MORPHINE SULFATE (PF) 4 MG/ML IV SOLN
4.0000 mg | Freq: Once | INTRAVENOUS | Status: AC
  Administered 2024-01-17: 4 mg via INTRAVENOUS
  Filled 2024-01-17: qty 1

## 2024-01-17 MED ORDER — IOHEXOL 350 MG/ML SOLN
75.0000 mL | Freq: Once | INTRAVENOUS | Status: AC | PRN
  Administered 2024-01-17: 75 mL via INTRAVENOUS

## 2024-01-17 MED ORDER — SODIUM CHLORIDE 0.9 % IV SOLN
1.0000 g | Freq: Once | INTRAVENOUS | Status: AC
  Administered 2024-01-17: 1 g via INTRAVENOUS
  Filled 2024-01-17: qty 10

## 2024-01-17 MED ORDER — ACETAMINOPHEN 325 MG PO TABS
650.0000 mg | ORAL_TABLET | Freq: Once | ORAL | Status: AC
  Administered 2024-01-17: 650 mg via ORAL
  Filled 2024-01-17: qty 2

## 2024-01-17 MED ORDER — HEPARIN (PORCINE) 25000 UT/250ML-% IV SOLN
1050.0000 [IU]/h | INTRAVENOUS | Status: DC
  Administered 2024-01-17 – 2024-01-19 (×3): 1050 [IU]/h via INTRAVENOUS
  Filled 2024-01-17 (×3): qty 250

## 2024-01-17 MED ORDER — HEPARIN BOLUS VIA INFUSION
4000.0000 [IU] | Freq: Once | INTRAVENOUS | Status: AC
  Administered 2024-01-17: 4000 [IU] via INTRAVENOUS

## 2024-01-17 NOTE — Telephone Encounter (Signed)
 Orthotics are in [place] called left message on VM for patient to schedule to [facility].

## 2024-01-17 NOTE — Progress Notes (Signed)
 Plan of Care Note for accepted transfer   Patient: TEMIMA KUTSCH MRN: [id number]   DOA: 01/17/2024  Facility requesting transfer: [facility]   Requesting Provider: Sidra Ruby, PA  Reason for transfer: PE   Facility course: 64 yr old female with T2DM, HTN, CAD, and RA presents with SOB and pleuritic pain.   She is found to have bilateral PE with RV/LV 1.0. Troponin and BNP are normal and BP has been stable.   She was started on IV heparin .   Plan of care: The patient is accepted for admission to Progressive unit, at [facility].   Author: Evalene GORMAN Sprinkles, MD 01/17/2024  Check [email or web address] for on-call coverage.  Nursing staff, Please call [facility] Admits & Consults System-Wide number on Amion as soon as patient's arrival, so appropriate admitting provider can evaluate the pt.

## 2024-01-17 NOTE — ED Triage Notes (Signed)
 Pt c/o shortness of breath, pain with breathing x 5 days, worsening. Also reports L flank pain radiating to back.   Denies fever.

## 2024-01-17 NOTE — ED Provider Notes (Signed)
 " [place] [facility] Provider Note   CSN: [id number] Arrival date & time: 01/17/24  1647     Patient presents with: Shortness of Breath   Amber [name] is a 64 y.o. female.   Patient with history of diabetes, hyperlipidemia, chronic diastolic CHF/CAD, hypertension, rheumatoid arthritis --presents to the emergency department today for evaluation of left-sided chest pain.  Patient reports that the pain began 4 days ago.  It was more mild at onset.  It is worse with deep breathing and with palpation and certain movements.  No cough, recently got over an upper respiratory infection.  She feels mild shortness of breath at times.  She denies lower extremity swelling.  She states that she feels short of breath lying flat.  No fevers currently.  Last night she had to take leftover tramadol , she had from a surgery about a year ago, to help her get some rest. Patient denies risk factors for pulmonary embolism including: unilateral leg swelling, history of DVT/PE/other blood clots, use of exogenous hormones, recent immobilizations, recent surgery, recent travel (>4hr segment), malignancy, hemoptysis.  Pain is not worse with exertion.  She denies diaphoresis or vomiting.  Pain does not radiate.         Prior to Admission medications  Medication Sig Start Date End Date Taking? Authorizing Provider  albuterol  (VENTOLIN  HFA) 108 (90 Base) MCG/ACT inhaler Inhale 2 puffs into the lungs every 6 (six) hours as needed. 03/08/22   Hazen Darryle BRAVO, FNP  Aspirin  81 MG CAPS 81 mg.    [provider]  Blood Glucose Monitoring Suppl DEVI 1 each by Does not apply route in the morning, at noon, and at bedtime. May substitute to any manufacturer covered by patient's insurance. 05/29/23   Danford, Rockie D, NP  brimonidine-timolol (COMBIGAN) 0.2-0.5 % ophthalmic solution  03/24/23   [provider]  calcium -vitamin D  (OSCAL WITH D) 500-200 MG-UNIT TABS tablet Take  by mouth.    [provider]  carvedilol  (COREG ) 12.5 MG tablet Take 1 tablet (12.5 mg total) by mouth 2 (two) times daily with a meal. 08/06/23   Daryl Setter, NP  cyanocobalamin  (VITAMIN B12) 1000 MCG/ML injection 1000mcg IM weekly x 4 weeks then once monthly 08/06/23   [name], [name], NP  diltiazem  (CARTIA  XT) 240 MG 24 hr capsule Take 1 capsule (240 mg total) by mouth daily. 08/06/23   [name], [name], NP  fluticasone  (FLONASE ) 50 MCG/ACT nasal spray Place 2 sprays into both nostrils daily. 03/07/22   [name], [name], NP  gabapentin  (NEURONTIN ) 100 MG capsule Take 1 capsule (100 mg total) by mouth at bedtime. Patient not taking: Reported on 12/24/2023 08/06/23   [name], [name], NP  hydrochlorothiazide  (HYDRODIURIL ) 25 MG tablet Take 1 tablet (25 mg total) by mouth daily. 12/20/23   [name], [name], NP  ibuprofen  (ADVIL ) 200 MG tablet Take 200 mg by mouth every 6 (six) hours as needed. Patient not taking: Reported on 12/24/2023    [provider]  Iron , Ferrous Sulfate , 325 (65 Fe) MG TABS Take 325 mg by mouth 2 (two) times daily. 11/07/21   [name], [name], NP  Lancets [facility] DELICA PLUS [place]) MISC Apply 1 each topically daily. 05/29/23   [provider]  meloxicam  (MOBIC ) 7.5 MG tablet Take 1 tablet (7.5 mg total) by mouth daily. 12/20/23   [name], [name], NP  nystatin  (MYCOSTATIN /NYSTOP ) powder Apply 1 Application topically 3 (three) times daily. 12/20/23   [name], [name], NP  ORENCIA  CLICKJECT 125  MG/ML SOAJ INJECT 125 MG UNDER THE SKIN EVERY 7 DAYS 10/11/23   Dolphus Reiter, MD  polyethylene glycol (MIRALAX  / GLYCOLAX ) 17 g packet Take 17 g by mouth 2 (two) times daily. Until stooling regularly. Then once daily or prn 10/14/23   Midge Sober, DO  predniSONE  (DELTASONE ) 10 MG tablet 12 day tapering dose 12/02/23   Magdalen Pasco RAMAN, DPM  rosuvastatin  (CRESTOR ) 10 MG tablet Take 1 tablet (10 mg total) by mouth daily. 08/06/23    [name], [name], NP  SUMAtriptan  (IMITREX ) 100 MG tablet Take 1 tablet (100 mg total) by mouth as needed for migraine. May repeat in 2 hours if headache persists or recurs. 07/17/22   [name], [name], NP  Syringe/Needle, Disp, (SYRINGE 3CC/22GX1-1/2) 22G X 1-1/2 3 ML MISC Use as directed 04/26/23   [name], [name], NP  timolol (TIMOPTIC) 0.5 % ophthalmic solution 1 drop every morning. 07/19/22   [provider]  tirzepatide  (MOUNJARO ) 7.5 MG/0.5ML Pen Inject 7.5 mg into the skin once a week. 01/06/24   Danford, Rockie D, NP  TRAVATAN Z 0.004 % SOLN ophthalmic solution Place 1 drop into both eyes at bedtime.  10/25/11   [provider]  valsartan  (DIOVAN ) 320 MG tablet Take 1 tablet (320 mg total) by mouth daily. 08/06/23   [name], [name], NP  Vitamin D , Ergocalciferol , (DRISDOL ) 1.25 MG (50000 UNIT) CAPS capsule Take 1 capsule (50,000 Units total) by mouth every 7 (seven) days. 12/24/23   Danford, Rockie D, NP    Allergies: Gabapentin     Review of Systems  Updated Vital Signs BP (!) 148/86   Pulse (!) 101   Temp 100 F (37.8 C) (Oral)   Resp 19   Ht 5' (1.524 m)   Wt 75.8 kg   LMP 08/09/2016   SpO2 100%   BMI 32.61 kg/m   Physical Exam Vitals and nursing note reviewed.  Constitutional:      Appearance: She is well-developed. She is not diaphoretic.  HENT:     Head: Normocephalic and atraumatic.     Mouth/Throat:     Mouth: Mucous membranes are not dry.  Eyes:     Conjunctiva/sclera: Conjunctivae normal.  Neck:     Vascular: Normal carotid pulses. No JVD.     Trachea: Trachea normal. No tracheal deviation.  Cardiovascular:     Rate and Rhythm: Normal rate and regular rhythm.     Pulses: No decreased pulses.          Radial pulses are 2+ on the right side and 2+ on the left side.     Heart sounds: Normal heart sounds, S1 normal and S2 normal. No murmur heard. Pulmonary:     Effort: Pulmonary effort is normal. No respiratory distress.     Breath  sounds: No wheezing.  Chest:     Chest wall: Tenderness present.       Comments: Patient very tender with guarding to palpation over the left lateral chest wall, especially below the axilla.  I do not see any rash or skin signs of trauma.  No deformities. Abdominal:     General: Bowel sounds are normal.     Palpations: Abdomen is soft.     Tenderness: There is no abdominal tenderness. There is no guarding or rebound.  Musculoskeletal:        General: Normal range of motion.     Cervical back: Normal range of motion and neck supple. No muscular tenderness.     Right lower leg: No tenderness.  No edema.     Left lower leg: No tenderness. No edema.  Skin:    General: Skin is warm and dry.     Coloration: Skin is not pale.  Neurological:     Mental Status: She is alert.     (all labs ordered are listed, but only abnormal results are displayed) Labs Reviewed  D-DIMER, QUANTITATIVE (NOT AT [facility]) - Abnormal; Notable for the following components:      Result Value   D-Dimer, Quant 3.61 (*)    All other components within normal limits  RESP PANEL BY RT-PCR (RSV, FLU A&B, COVID)  RVPGX2  BASIC METABOLIC PANEL WITH GFR  CBC  PRO BRAIN NATRIURETIC PEPTIDE  HEPARIN  LEVEL (UNFRACTIONATED)  CBC  URINALYSIS, ROUTINE W REFLEX MICROSCOPIC  TROPONIN T, HIGH SENSITIVITY  TROPONIN T, HIGH SENSITIVITY    ED ECG REPORT   Date: 01/17/2024  Rate: 98  Rhythm: normal sinus rhythm  QRS Axis: normal  Intervals: normal  ST/T Wave abnormalities: normal  Conduction Disutrbances:none  Narrative Interpretation:   Old EKG Reviewed: unchanged from 09/2022  I have personally reviewed the EKG tracing and agree with the computerized printout as noted.   Radiology: DG Chest 2 View Result Date: 01/17/2024 EXAM: 2 VIEW(S) XRAY OF THE CHEST 01/17/2024 05:27:00 PM COMPARISON: None available. CLINICAL HISTORY: shortness of breath FINDINGS: LUNGS AND PLEURA: New minimal left lower lobe airspace disease. No  pleural effusion. No pneumothorax. HEART AND MEDIASTINUM: No acute abnormality of the cardiac and mediastinal silhouettes. BONES AND SOFT TISSUES: No acute osseous abnormality. IMPRESSION: 1. New minimal left lower lobe airspace disease. Electronically signed by: Greig Pique MD 01/17/2024 05:51 PM EST RP Workstation: [id number]     Procedures   Medications Ordered in the ED - No data to display  ED Course  Patient seen and examined. History obtained directly from patient.   Labs/EKG: I personally reviewed and interpreted labs ordered in triage including CBC which is unremarkable; BMP which is unremarkable; viral panel that was negative.  I have added D-dimer, troponin, proBNP.  Imaging: I personally reviewed and interpreted chest x-ray, question minimal left basilar disease.  Medications/Fluids: None ordered  Most recent vital signs reviewed and are as follows: BP (!) 148/86   Pulse (!) 101   Temp 100 F (37.8 C) (Oral)   Resp 19   Ht 5' (1.524 m)   Wt 75.8 kg   LMP 08/09/2016   SpO2 100%   BMI 32.61 kg/m   Initial impression: Left sided chest pain, pleuritic in nature but also patient very tender to palpation raising concern for MSK pain.  I would like to rule out PE.  Patient is low risk [place].  D-dimer pending.  Will also check troponin, however symptoms are very atypical for ACS.  //  Reassessment performed. Patient appears stable.  Labs personally reviewed and interpreted including: CBC unremarkable; BMP unremarkable; troponin was negative; BNP was negative.  D-dimer was markedly elevated at 3.61. Reviewed pertinent lab work and imaging with patient at bedside. Questions answered.   Plan: CT angio has been ordered to evaluate for possibility of PE  //  Reassessment performed. Patient appears comfortable.  Will go ahead and give pain medication due to ongoing pain.  Imaging personally visualized and interpreted including: CT demonstrates PE with signs of right heart  strain.  There is some patchy space disease in the lower lungs.  Reviewed pertinent lab work and imaging with patient at bedside. Questions answered.   Most current vital  signs reviewed and are as follows: BP 138/76   Pulse 98   Temp (!) 102.1 F (38.9 C) (Oral)   Resp 15   Ht 5' (1.524 m)   Wt 75.8 kg   LMP 08/09/2016   SpO2 90%   BMI 32.61 kg/m   Plan: Admit to hospital.  Will initiate IV heparin .  Patient has a low-grade fever and will give Tylenol  for this.  Added UA to evaluate for infection.  Discussed case with Dr. Neysa, attending physician.  Discussed case with Dr. Charlton, [facility] hospitalist.  He accepts for admission.  IV Rocephin /azithromycin  ordered due to elevated fever and possible airspace disease in the lungs.  CRITICAL CARE Performed by: Fonda Ruby PA-C Total critical care time: 40 minutes Critical care time was exclusive of separately billable procedures and treating other patients. Critical care was necessary to treat or prevent imminent or life-threatening deterioration. Critical care was time spent personally by me on the following activities: development of treatment plan with patient and/or surrogate as well as nursing, discussions with consultants, evaluation of patient's response to treatment, examination of patient, obtaining history from patient or surrogate, ordering and performing treatments and interventions, ordering and review of laboratory studies, ordering and review of radiographic studies, pulse oximetry and re-evaluation of patient's condition.                                   Medical Decision Making Amount and/or Complexity of Data Reviewed Labs: ordered. Radiology: ordered.  Risk OTC drugs. Prescription drug management. Decision regarding hospitalization.   Patient with apparently unprovoked PE.  Submassive on CT imaging with some signs of right heart strain, however troponin fortunately is normal, BNP is normal.  Fever noted,  possibly due to PE, however will also consider infection.  No hypotension.  Possibly from patchy disease in the lung bases, will cover with Rocephin  and azithromycin .  UA also sent.     Final diagnoses:  Acute pulmonary embolism with acute cor pulmonale, unspecified pulmonary embolism type [facility])    ED Discharge Orders     None          Ruby Fonda, PA-C 01/17/24 2117    Neysa Caron PARAS, DO 01/17/24 2322  "

## 2024-01-17 NOTE — Telephone Encounter (Signed)
FYI. Pt going to ED.  

## 2024-01-17 NOTE — Progress Notes (Signed)
 PHARMACY - ANTICOAGULATION CONSULT NOTE  Pharmacy Consult for IV heparin  Indication: pulmonary embolus  Allergies[1]  Patient Measurements: Height: 5' (152.4 cm) Weight: 75.8 kg (167 lb) IBW/kg (Calculated) : 45.5 HEPARIN  DW (KG): 62.5  Vital Signs: Temp: 100 F (37.8 C) (01/16 1658) Temp Source: Oral (01/16 1658) BP: 148/86 (01/16 1658) Pulse Rate: 101 (01/16 1658)  Labs: Recent Labs    01/17/24 1700  HGB 12.7  HCT 39.6  PLT 171  CREATININE 0.74    Estimated Creatinine Clearance: 66.3 mL/min (by C-G formula based on SCr of 0.74 mg/dL).   Medical History: Past Medical History:  Diagnosis Date   Anemia    Arthritis    Asthma 05/03/2020   Back pain    Blood transfusion without reported diagnosis    CAD (coronary artery disease) 04/24/2017   Cataract    Cerebrovascular disease 10/13/2020   Chronic diastolic CHF (congestive heart failure) (HCC) 04/24/2017   Colon polyps    Controlled type 2 diabetes mellitus without complication, without long-term current use of insulin  (HCC) 05/31/2017   Diabetic peripheral neuropathy (HCC) 01/05/2020   Ectopic pregnancy    RIGHT salpingostomy   Elective abortion    Elective abortion 09/26/2020   RIGHT salpingostomy   Essential hypertension 09/04/2017   GERD (gastroesophageal reflux disease)    Glaucoma    both eyes   Headache    migraines   History of chicken pox    History of chicken pox 09/26/2020   migraines   Hyperlipemia    Hypertension    Iron  deficiency anemia 09/02/2013   Joint pain    Migraine 04/19/2014   Obesity (BMI 30-39.9) 10/17/2012   Palpitations 10/01/2012   Pre-diabetes    Rheumatoid arthritis (HCC) 11/07/2011   Rheumatoid arthritis(714.0) 11/07/2011   SAB (spontaneous abortion)    SAB (spontaneous abortion) 11/07/2011   Shortness of breath    Sjogren's disease    Swelling of lower extremity    Vitamin B12 deficiency    Wheezing     Medications:  (Not in a hospital admission)    Assessment: 64 yo F presenting with SOB, found to have PE with R heart strain. Not on AC PTA. Pharmacy consulted to dose heparin .  CBC wnl  Goal of Therapy:  Heparin  level 0.3-0.7 units/ml Monitor platelets by anticoagulation protocol: Yes   Plan:  Give 4000 units bolus x 1 Start heparin  infusion at 1050 units/hr Check anti-Xa level in 6 hours and daily while on heparin  Continue to monitor H&H and platelets  Elma Fail, PharmD PGY1 Clinical Pharmacist [facility]  01/17/2024 8:05 PM       [1]  Allergies Allergen Reactions   Gabapentin      Made her crazy

## 2024-01-17 NOTE — ED Notes (Signed)
 Pt temp 102.1 RN informed

## 2024-01-17 NOTE — ED Notes (Signed)
 Called lab to add BNP & troponin to previously collected labwork

## 2024-01-17 NOTE — Telephone Encounter (Signed)
 FYI Only or Action Required?: FYI only for provider: ED advised.  Patient was last seen in primary care on 12/24/2023 by Jonel Rockie [name], NP.  Called Nurse Triage reporting Chest Pain.  Symptoms began several days ago.  Interventions attempted: Nothing.  Symptoms are: gradually worsening.  Triage Disposition: Go to ED Now (or PCP Triage)  Patient/caregiver understands and will follow disposition?: Unsure  Message from [place] [facility] sent at 01/17/2024 10:23 AM EST  Reason for Triage: Pain in left side, hurts really bad if she breathes in or cough. Pain is a 9   Reason for Disposition  Taking a deep breath makes pain worse  Answer Assessment - Initial Assessment Questions Pt had URI 3 weeks ago, those sx have improved and resolved but now pt states has pain in chest and back on L side since Monday and has gotten worse each day. Pt took something for pain yesterday d/t pain being so bad. ED advised   1. LOCATION: Where does it hurt?       L side, rib pain  2. RADIATION: Does the pain go anywhere else? (e.g., into neck, jaw, arms, back)     Radiates into back 3. ONSET: When did the chest pain begin? (Minutes, hours or days)      Monday  4. PATTERN: Does the pain come and go, or has it been constant since it started?  Does it get worse with exertion?      constant 6. SEVERITY: How bad is the pain?  (e.g., Scale 1-10; mild, moderate, or severe)     9/10 8. PULMONARY RISK FACTORS: Do you have any history of lung disease?  (e.g., blood clots in lung, asthma, emphysema, birth control pills)     no 9. CAUSE: What do you think is causing the chest pain?     Had URI sx 3 weeks ago  10. OTHER SYMPTOMS: Do you have any other symptoms? (e.g., dizziness, nausea, vomiting, sweating, fever, difficulty breathing, cough)       Pain with breathing, cough, and sneezing  Protocols used: Chest Pain-A-AH

## 2024-01-18 DIAGNOSIS — Z7985 Long-term (current) use of injectable non-insulin antidiabetic drugs: Secondary | ICD-10-CM | POA: Diagnosis not present

## 2024-01-18 DIAGNOSIS — Z83438 Family history of other disorder of lipoprotein metabolism and other lipidemia: Secondary | ICD-10-CM | POA: Diagnosis not present

## 2024-01-18 DIAGNOSIS — I10 Essential (primary) hypertension: Secondary | ICD-10-CM

## 2024-01-18 DIAGNOSIS — E1142 Type 2 diabetes mellitus with diabetic polyneuropathy: Secondary | ICD-10-CM | POA: Diagnosis present

## 2024-01-18 DIAGNOSIS — J9601 Acute respiratory failure with hypoxia: Secondary | ICD-10-CM | POA: Diagnosis present

## 2024-01-18 DIAGNOSIS — Z9841 Cataract extraction status, right eye: Secondary | ICD-10-CM | POA: Diagnosis not present

## 2024-01-18 DIAGNOSIS — M7989 Other specified soft tissue disorders: Secondary | ICD-10-CM | POA: Diagnosis not present

## 2024-01-18 DIAGNOSIS — M069 Rheumatoid arthritis, unspecified: Secondary | ICD-10-CM | POA: Diagnosis present

## 2024-01-18 DIAGNOSIS — E66811 Obesity, class 1: Secondary | ICD-10-CM | POA: Diagnosis present

## 2024-01-18 DIAGNOSIS — I110 Hypertensive heart disease with heart failure: Secondary | ICD-10-CM | POA: Diagnosis present

## 2024-01-18 DIAGNOSIS — E785 Hyperlipidemia, unspecified: Secondary | ICD-10-CM | POA: Diagnosis present

## 2024-01-18 DIAGNOSIS — Z888 Allergy status to other drugs, medicaments and biological substances status: Secondary | ICD-10-CM | POA: Diagnosis not present

## 2024-01-18 DIAGNOSIS — R0781 Pleurodynia: Secondary | ICD-10-CM | POA: Diagnosis not present

## 2024-01-18 DIAGNOSIS — Z823 Family history of stroke: Secondary | ICD-10-CM | POA: Diagnosis not present

## 2024-01-18 DIAGNOSIS — I2699 Other pulmonary embolism without acute cor pulmonale: Secondary | ICD-10-CM | POA: Diagnosis present

## 2024-01-18 DIAGNOSIS — Z6832 Body mass index (BMI) 32.0-32.9, adult: Secondary | ICD-10-CM | POA: Diagnosis not present

## 2024-01-18 DIAGNOSIS — I5032 Chronic diastolic (congestive) heart failure: Secondary | ICD-10-CM | POA: Diagnosis present

## 2024-01-18 DIAGNOSIS — I2609 Other pulmonary embolism with acute cor pulmonale: Secondary | ICD-10-CM | POA: Diagnosis present

## 2024-01-18 DIAGNOSIS — Z820 Family history of epilepsy and other diseases of the nervous system: Secondary | ICD-10-CM | POA: Diagnosis not present

## 2024-01-18 DIAGNOSIS — G4733 Obstructive sleep apnea (adult) (pediatric): Secondary | ICD-10-CM | POA: Diagnosis present

## 2024-01-18 DIAGNOSIS — I2510 Atherosclerotic heart disease of native coronary artery without angina pectoris: Secondary | ICD-10-CM | POA: Diagnosis present

## 2024-01-18 DIAGNOSIS — Z833 Family history of diabetes mellitus: Secondary | ICD-10-CM | POA: Diagnosis not present

## 2024-01-18 DIAGNOSIS — Z1152 Encounter for screening for COVID-19: Secondary | ICD-10-CM | POA: Diagnosis not present

## 2024-01-18 DIAGNOSIS — M3500 Sicca syndrome, unspecified: Secondary | ICD-10-CM | POA: Diagnosis present

## 2024-01-18 DIAGNOSIS — Z8249 Family history of ischemic heart disease and other diseases of the circulatory system: Secondary | ICD-10-CM | POA: Diagnosis not present

## 2024-01-18 DIAGNOSIS — Z7982 Long term (current) use of aspirin: Secondary | ICD-10-CM | POA: Diagnosis not present

## 2024-01-18 DIAGNOSIS — Z9842 Cataract extraction status, left eye: Secondary | ICD-10-CM | POA: Diagnosis not present

## 2024-01-18 DIAGNOSIS — Z800 Family history of malignant neoplasm of digestive organs: Secondary | ICD-10-CM | POA: Diagnosis not present

## 2024-01-18 LAB — URINALYSIS, ROUTINE W REFLEX MICROSCOPIC
Bilirubin Urine: NEGATIVE
Glucose, UA: NEGATIVE mg/dL
Hgb urine dipstick: NEGATIVE
Ketones, ur: NEGATIVE mg/dL
Leukocytes,Ua: NEGATIVE
Nitrite: NEGATIVE
Protein, ur: NEGATIVE mg/dL
Specific Gravity, Urine: <=1.005 (ref 1.005–1.030)
pH: 5.5 (ref 5.0–8.0)

## 2024-01-18 LAB — CBC
HCT: 38 % (ref 36.0–46.0)
Hemoglobin: 12.3 g/dL (ref 12.0–15.0)
MCH: 28.4 pg (ref 26.0–34.0)
MCHC: 32.4 g/dL (ref 30.0–36.0)
MCV: 87.8 fL (ref 80.0–100.0)
Platelets: 172 K/uL (ref 150–400)
RBC: 4.33 MIL/uL (ref 3.87–5.11)
RDW: 14.6 % (ref 11.5–15.5)
WBC: 4.6 K/uL (ref 4.0–10.5)
nRBC: 0 % (ref 0.0–0.2)

## 2024-01-18 LAB — HEPARIN LEVEL (UNFRACTIONATED)
Heparin Unfractionated: 0.59 [IU]/mL (ref 0.30–0.70)
Heparin Unfractionated: 0.33 [IU]/mL (ref 0.30–0.70)

## 2024-01-18 MED ORDER — ONDANSETRON HCL 4 MG/2ML IJ SOLN: 4.0000 mg | Freq: Four times a day (QID) | INTRAMUSCULAR | Status: DC | PRN

## 2024-01-18 MED ORDER — OXYCODONE HCL 5 MG PO TABS
5.0000 mg | ORAL_TABLET | Freq: Four times a day (QID) | ORAL | Status: DC | PRN
  Administered 2024-01-18 – 2024-01-21 (×7): 5 mg via ORAL
  Filled 2024-01-18 (×8): qty 1

## 2024-01-18 MED ORDER — ACETAMINOPHEN 325 MG PO TABS
650.0000 mg | ORAL_TABLET | Freq: Four times a day (QID) | ORAL | Status: DC | PRN
  Administered 2024-01-18 (×2): 650 mg via ORAL
  Filled 2024-01-18 (×2): qty 2

## 2024-01-18 MED ORDER — CARVEDILOL 12.5 MG PO TABS
12.5000 mg | ORAL_TABLET | Freq: Two times a day (BID) | ORAL | Status: DC
  Administered 2024-01-18 – 2024-01-21 (×6): 12.5 mg via ORAL
  Filled 2024-01-18 (×6): qty 1

## 2024-01-18 MED ORDER — SENNOSIDES-DOCUSATE SODIUM 8.6-50 MG PO TABS: 1.0000 | ORAL_TABLET | Freq: Every evening | ORAL | Status: DC | PRN

## 2024-01-18 MED ORDER — ROSUVASTATIN CALCIUM 10 MG PO TABS
10.0000 mg | ORAL_TABLET | Freq: Every day | ORAL | Status: DC
  Administered 2024-01-18 – 2024-01-21 (×4): 10 mg via ORAL
  Filled 2024-01-18 (×4): qty 1

## 2024-01-18 MED ORDER — OYSTER SHELL CALCIUM/D 500-200 MG-UNIT PO TABS: 1.0000 | ORAL_TABLET | Freq: Every day | ORAL | Status: DC

## 2024-01-18 MED ORDER — TRIAMTERENE-HCTZ 37.5-25 MG PO TABS
1.0000 | ORAL_TABLET | Freq: Every day | ORAL | Status: DC
  Administered 2024-01-19 – 2024-01-21 (×3): 1 via ORAL
  Filled 2024-01-18 (×3): qty 1

## 2024-01-18 MED ORDER — CARMEX CLASSIC LIP BALM EX OINT
1.0000 | TOPICAL_OINTMENT | CUTANEOUS | Status: DC | PRN
  Filled 2024-01-18: qty 10

## 2024-01-18 MED ORDER — DILTIAZEM HCL ER COATED BEADS 240 MG PO CP24
240.0000 mg | ORAL_CAPSULE | Freq: Every day | ORAL | Status: DC
  Administered 2024-01-18 – 2024-01-21 (×4): 240 mg via ORAL
  Filled 2024-01-18 (×4): qty 1

## 2024-01-18 MED ORDER — HYDROMORPHONE HCL 1 MG/ML IJ SOLN
0.5000 mg | Freq: Four times a day (QID) | INTRAMUSCULAR | Status: DC | PRN
  Administered 2024-01-18: 0.5 mg via INTRAVENOUS
  Filled 2024-01-18: qty 0.5

## 2024-01-18 MED ORDER — ACETAMINOPHEN 650 MG RE SUPP: 650.0000 mg | Freq: Four times a day (QID) | RECTAL | Status: DC | PRN

## 2024-01-18 MED ORDER — LATANOPROST 0.005 % OP SOLN
1.0000 [drp] | Freq: Every day | OPHTHALMIC | Status: DC
  Administered 2024-01-18 – 2024-01-20 (×3): 1 [drp] via OPHTHALMIC
  Filled 2024-01-18: qty 2.5

## 2024-01-18 MED ORDER — BISACODYL 5 MG PO TBEC
5.0000 mg | DELAYED_RELEASE_TABLET | Freq: Every day | ORAL | Status: DC | PRN
  Administered 2024-01-19: 5 mg via ORAL
  Filled 2024-01-18: qty 1

## 2024-01-18 MED ORDER — FERROUS SULFATE 325 (65 FE) MG PO TABS
325.0000 mg | ORAL_TABLET | Freq: Two times a day (BID) | ORAL | Status: DC
  Administered 2024-01-18 – 2024-01-21 (×6): 325 mg via ORAL
  Filled 2024-01-18 (×6): qty 1

## 2024-01-18 MED ORDER — ONDANSETRON HCL 4 MG PO TABS: 4.0000 mg | ORAL_TABLET | Freq: Four times a day (QID) | ORAL | Status: DC | PRN

## 2024-01-18 MED ORDER — ACETAMINOPHEN 500 MG PO TABS
1000.0000 mg | ORAL_TABLET | Freq: Once | ORAL | Status: AC
  Administered 2024-01-18: 1000 mg via ORAL
  Filled 2024-01-18: qty 2

## 2024-01-18 MED ORDER — CHOLECALCIFEROL 10 MCG (400 UNIT) PO TABS
200.0000 [IU] | ORAL_TABLET | Freq: Every day | ORAL | Status: DC
  Administered 2024-01-19 – 2024-01-21 (×3): 200 [IU] via ORAL
  Filled 2024-01-18 (×4): qty 1

## 2024-01-18 MED ORDER — CALCIUM CARBONATE ANTACID 500 MG PO CHEW
1.0000 | CHEWABLE_TABLET | Freq: Every day | ORAL | Status: DC
  Administered 2024-01-18 – 2024-01-21 (×4): 200 mg via ORAL
  Filled 2024-01-18 (×4): qty 1

## 2024-01-18 MED ORDER — ASPIRIN 81 MG PO TBEC
81.0000 mg | DELAYED_RELEASE_TABLET | Freq: Every day | ORAL | Status: DC
  Administered 2024-01-18 – 2024-01-20 (×3): 81 mg via ORAL
  Filled 2024-01-18 (×3): qty 1

## 2024-01-18 NOTE — Progress Notes (Signed)
 PHARMACY - ANTICOAGULATION CONSULT NOTE  Pharmacy Consult for IV heparin  Indication: pulmonary embolus  Allergies[1]  Patient Measurements: Height: 5' (152.4 cm) Weight: 75.8 kg (167 lb) IBW/kg (Calculated) : 45.5 HEPARIN  DW (KG): 62.5  Vital Signs: Temp: 98.4 F (36.9 C) (01/17 1010) Temp Source: Oral (01/17 1010) BP: 120/78 (01/17 1010) Pulse Rate: 81 (01/17 1010)  Labs: Recent Labs    01/17/24 1700 01/18/24 0259 01/18/24 0355 01/18/24 1015  HGB 12.7 12.3  --   --   HCT 39.6 38.0  --   --   PLT 171 172  --   --   HEPARINUNFRC  --   --  0.59 0.33  CREATININE 0.74  --   --   --     Estimated Creatinine Clearance: 66.3 mL/min (by C-G formula based on SCr of 0.74 mg/dL).   Medical History: Past Medical History:  Diagnosis Date   Anemia    Arthritis    Asthma 05/03/2020   Back pain    Blood transfusion without reported diagnosis    CAD (coronary artery disease) 04/24/2017   Cataract    Cerebrovascular disease 10/13/2020   Chronic diastolic CHF (congestive heart failure) (HCC) 04/24/2017   Colon polyps    Controlled type 2 diabetes mellitus without complication, without long-term current use of insulin  (HCC) 05/31/2017   Diabetic peripheral neuropathy (HCC) 01/05/2020   Ectopic pregnancy    RIGHT salpingostomy   Elective abortion    Elective abortion 09/26/2020   RIGHT salpingostomy   Essential hypertension 09/04/2017   GERD (gastroesophageal reflux disease)    Glaucoma    both eyes   Headache    migraines   History of chicken pox    History of chicken pox 09/26/2020   migraines   Hyperlipemia    Hypertension    Iron  deficiency anemia 09/02/2013   Joint pain    Migraine 04/19/2014   Obesity (BMI 30-39.9) 10/17/2012   Palpitations 10/01/2012   Pre-diabetes    Rheumatoid arthritis (HCC) 11/07/2011   Rheumatoid arthritis(714.0) 11/07/2011   SAB (spontaneous abortion)    SAB (spontaneous abortion) 11/07/2011   Shortness of breath    Sjogren's  disease    Swelling of lower extremity    Vitamin B12 deficiency    Wheezing     Medications:  Medications Prior to Admission  Medication Sig Dispense Refill Last Dose/Taking   albuterol  (VENTOLIN  HFA) 108 (90 Base) MCG/ACT inhaler Inhale 2 puffs into the lungs every 6 (six) hours as needed. 18 g 0    Aspirin  81 MG CAPS 81 mg.      Blood Glucose Monitoring Suppl DEVI 1 each by Does not apply route in the morning, at noon, and at bedtime. May substitute to any manufacturer covered by patient's insurance. 1 each 0    brimonidine-timolol (COMBIGAN) 0.2-0.5 % ophthalmic solution       calcium -vitamin D  (OSCAL WITH D) 500-200 MG-UNIT TABS tablet Take by mouth.      carvedilol  (COREG ) 12.5 MG tablet Take 1 tablet (12.5 mg total) by mouth 2 (two) times daily with a meal. 180 tablet 1    cyanocobalamin  (VITAMIN B12) 1000 MCG/ML injection 1000mcg IM weekly x 4 weeks then once monthly 12 mL 0    diltiazem  (CARTIA  XT) 240 MG 24 hr capsule Take 1 capsule (240 mg total) by mouth daily. 90 capsule 1    fluticasone  (FLONASE ) 50 MCG/ACT nasal spray Place 2 sprays into both nostrils daily. 16 g 1    gabapentin  (NEURONTIN )  100 MG capsule Take 1 capsule (100 mg total) by mouth at bedtime. (Patient not taking: Reported on 12/24/2023) 90 capsule 1    hydrochlorothiazide  (HYDRODIURIL ) 25 MG tablet Take 1 tablet (25 mg total) by mouth daily. 90 tablet 0    ibuprofen  (ADVIL ) 200 MG tablet Take 200 mg by mouth every 6 (six) hours as needed. (Patient not taking: Reported on 12/24/2023)      Iron , Ferrous Sulfate , 325 (65 Fe) MG TABS Take 325 mg by mouth 2 (two) times daily. 30 tablet     Lancets (ONETOUCH DELICA PLUS LANCET33G) MISC Apply 1 each topically daily.      meloxicam  (MOBIC ) 7.5 MG tablet Take 1 tablet (7.5 mg total) by mouth daily. 14 tablet 0    nystatin  (MYCOSTATIN /NYSTOP ) powder Apply 1 Application topically 3 (three) times daily. 30 g 1    ORENCIA  CLICKJECT 125 MG/ML SOAJ INJECT 125 MG UNDER THE SKIN  EVERY 7 DAYS 4 mL 1    polyethylene glycol (MIRALAX  / GLYCOLAX ) 17 g packet Take 17 g by mouth 2 (two) times daily. Until stooling regularly. Then once daily or prn      predniSONE  (DELTASONE ) 10 MG tablet 12 day tapering dose 48 tablet 0    rosuvastatin  (CRESTOR ) 10 MG tablet Take 1 tablet (10 mg total) by mouth daily. 90 tablet 1    SUMAtriptan  (IMITREX ) 100 MG tablet Take 1 tablet (100 mg total) by mouth as needed for migraine. May repeat in 2 hours if headache persists or recurs. 30 tablet 5    Syringe/Needle, Disp, (SYRINGE 3CC/22GX1-1/2) 22G X 1-1/2 3 ML MISC Use as directed 50 each 0    timolol (TIMOPTIC) 0.5 % ophthalmic solution 1 drop every morning.      tirzepatide  (MOUNJARO ) 7.5 MG/0.5ML Pen Inject 7.5 mg into the skin once a week. 6 mL 0    TRAVATAN Z 0.004 % SOLN ophthalmic solution Place 1 drop into both eyes at bedtime.       valsartan  (DIOVAN ) 320 MG tablet Take 1 tablet (320 mg total) by mouth daily. 90 tablet 1    Vitamin D , Ergocalciferol , (DRISDOL ) 1.25 MG (50000 UNIT) CAPS capsule Take 1 capsule (50,000 Units total) by mouth every 7 (seven) days. 13 capsule 0     Assessment: 64 yo F presenting with SOB, found to have PE with R heart strain. Not on AC PTA. Elevated D-dimer on admission. Pharmacy consulted to dose heparin .  Today, 01/18/24 -Confirmatory heparin  level 0.33 = therapeutic (noted decrease from AM level) on infusion running at 1050 units/hr  -CBC stable  -no transfusion issues or bleeding per RN   Goal of Therapy:  Heparin  level 0.3-0.7 units/ml Monitor platelets by anticoagulation protocol: Yes   Plan:  Continue heparin  infusion at 1050 units/hr Monitor CBC and heparin  level daily Continue to monitor signs and symptoms of bleeding   Thank you for allowing pharmacy to be a part of this patients care.  Marget Hench, PharmD Clinical Pharmacist 01/18/2024 11:48 AM            [1]  Allergies Allergen Reactions   Gabapentin      Made her  crazy

## 2024-01-18 NOTE — H&P (Addendum)
 " History and Physical  Amber [name] FMW:[id number] DOB: 03-03-1960 DOA: 01/17/2024  PCP: Daryl Setter, NP   Chief Complaint: Shortness of breath, chest pain  HPI: Amber [name] is a 64 y.o. female with medical history significant for CAD, T2DM, chronic diastolic HF, GERD, HTN, HLD, diabetic peripheral neuropathy, rheumatoid arthritis, IDA and OSA on CPAP who presented with [facility] ED for evaluation of shortness of breath and pleuritic chest pain.  Patient reports she has had shortness of breath since Monday but became worse on Wednesday. She started having associated left-sided chest pain that was worse with deep breaths and some radiation to her back. Denies any long car or plane rides and has not had any recent surgeries or long immobility. She endorsed mild dizziness when up and walking but denies any palpitations, fevers, chills, cough, headache, nausea or vomiting.  ED Course: Initial vitals show patient afebrile RR 13-25, HR 70-90s, SBP 110-140s, SpO2 98% on 2 L [place]. Initial labs show D-dimer 3.61 otherwise unremarkable with normal CBC, CMP, troponin, proBNP, negative flu/RSV/COVID test and UA with no signs of infection EKG shows sinus rhythm with possible LAE. CTA chest PE study pulmonary embolism with right heart strain (RV/LV ratio = 1.0). Pt received IV morphine , IV Rocephin ,  azithromycin  and started on heparin  drip. Patient was admitted to [facility] service and transferred to [facility].  Review of Systems: Please see HPI for pertinent positives and negatives. A complete 10 system review of systems are otherwise negative.  Past Medical History:  Diagnosis Date   Anemia    Arthritis    Asthma 05/03/2020   Back pain    Blood transfusion without reported diagnosis    CAD (coronary artery disease) 04/24/2017   Cataract    Cerebrovascular disease 10/13/2020   Chronic diastolic CHF (congestive heart failure) (HCC) 04/24/2017   Colon polyps    Controlled type  2 diabetes mellitus without complication, without long-term current use of insulin  (HCC) 05/31/2017   Diabetic peripheral neuropathy (HCC) 01/05/2020   Ectopic pregnancy    RIGHT salpingostomy   Elective abortion    Elective abortion 09/26/2020   RIGHT salpingostomy   Essential hypertension 09/04/2017   GERD (gastroesophageal reflux disease)    Glaucoma    both eyes   Headache    migraines   History of chicken pox    History of chicken pox 09/26/2020   migraines   Hyperlipemia    Hypertension    Iron  deficiency anemia 09/02/2013   Joint pain    Migraine 04/19/2014   Obesity (BMI 30-39.9) 10/17/2012   Palpitations 10/01/2012   Pre-diabetes    Rheumatoid arthritis (HCC) 11/07/2011   Rheumatoid arthritis(714.0) 11/07/2011   SAB (spontaneous abortion)    SAB (spontaneous abortion) 11/07/2011   Shortness of breath    Sjogren's disease    Swelling of lower extremity    Vitamin B12 deficiency    Wheezing    Past Surgical History:  Procedure Laterality Date   ABDOMINAL SURGERY     bowel repair   CATARACT EXTRACTION     02/25/2023, 03/11/2023   COLONOSCOPY     COLONOSCOPY  08/2022   DILATION AND CURETTAGE OF UTERUS     X 2   KNEE ARTHROSCOPY W/ MENISCAL REPAIR Right 01/16/2023   MENISCUS REPAIR Right    MENISCUS TEAR REPAIR   MYOMECTOMY N/A 07/27/2014   Procedure: ABDOMINAL MYOMECTOMY;  Surgeon: Jerolyn Foil, MD;  Location: [facility];  Service: Gynecology;  Laterality: N/A;  PELVIC LAPAROSCOPY  1994   IN 1999 LAPAROSCOPY WITH INCIDENTAL ENTEROTOMY REQUIRING LAPAROTOMY   ROTATOR CUFF REPAIR Left 06/21/2021   UTERINE FIBROID SURGERY     July 2016   Social History:  reports that she has never smoked. She has never been exposed to tobacco smoke. She has never used smokeless tobacco. She reports that she does not drink alcohol and does not use drugs.  Allergies[1]  Family History  Problem Relation Age of Onset   Hypertension Mother        died from heart disease   Heart  disease Mother        deceased, unknown cause   Stroke Mother    Obesity Mother    High Cholesterol Mother    Sudden death Father    Diabetes Sister    Hypertension Sister    Esophageal cancer Sister    Stomach cancer Sister    Cancer Sister    Diabetes Sister    Parkinson's disease Brother    Rectal cancer Neg Hx    Colon cancer Neg Hx    Colon polyps Neg Hx    Breast cancer Neg Hx      Prior to Admission medications  Medication Sig Start Date End Date Taking? Authorizing Provider  albuterol  (VENTOLIN  HFA) 108 (90 Base) MCG/ACT inhaler Inhale 2 puffs into the lungs every 6 (six) hours as needed. 03/08/22   Hazen Darryle BRAVO, FNP  Aspirin  81 MG CAPS 81 mg.    [provider]  Blood Glucose Monitoring Suppl DEVI 1 each by Does not apply route in the morning, at noon, and at bedtime. May substitute to any manufacturer covered by patient's insurance. 05/29/23   Danford, Rockie D, NP  brimonidine-timolol (COMBIGAN) 0.2-0.5 % ophthalmic solution  03/24/23   [provider]  calcium -vitamin D  (OSCAL WITH D) 500-200 MG-UNIT TABS tablet Take by mouth.    [provider]  carvedilol  (COREG ) 12.5 MG tablet Take 1 tablet (12.5 mg total) by mouth 2 (two) times daily with a meal. 08/06/23   Daryl Setter, NP  cyanocobalamin  (VITAMIN B12) 1000 MCG/ML injection 1000mcg IM weekly x 4 weeks then once monthly 08/06/23   [name], [name], NP  diltiazem  (CARTIA  XT) 240 MG 24 hr capsule Take 1 capsule (240 mg total) by mouth daily. 08/06/23   [name], [name], NP  fluticasone  (FLONASE ) 50 MCG/ACT nasal spray Place 2 sprays into both nostrils daily. 03/07/22   [name], [name], NP  gabapentin  (NEURONTIN ) 100 MG capsule Take 1 capsule (100 mg total) by mouth at bedtime. Patient not taking: Reported on 12/24/2023 08/06/23   [name], [name], NP  hydrochlorothiazide  (HYDRODIURIL ) 25 MG tablet Take 1 tablet (25 mg total) by mouth daily. 12/20/23   [name], [name], NP  ibuprofen   (ADVIL ) 200 MG tablet Take 200 mg by mouth every 6 (six) hours as needed. Patient not taking: Reported on 12/24/2023    [provider]  Iron , Ferrous Sulfate , 325 (65 Fe) MG TABS Take 325 mg by mouth 2 (two) times daily. 11/07/21   [name], [name], NP  Lancets [facility] DELICA PLUS [place]) MISC Apply 1 each topically daily. 05/29/23   [provider]  meloxicam  (MOBIC ) 7.5 MG tablet Take 1 tablet (7.5 mg total) by mouth daily. 12/20/23   [name], [name], NP  nystatin  (MYCOSTATIN /NYSTOP ) powder Apply 1 Application topically 3 (three) times daily. 12/20/23   Daryl Setter, NP  ORENCIA  CLICKJECT 125 MG/ML SOAJ INJECT 125 MG UNDER THE SKIN EVERY 7 DAYS 10/11/23  Dolphus Reiter, MD  polyethylene glycol (MIRALAX  / GLYCOLAX ) 17 g packet Take 17 g by mouth 2 (two) times daily. Until stooling regularly. Then once daily or prn 10/14/23   Midge Sober, DO  predniSONE  (DELTASONE ) 10 MG tablet 12 day tapering dose 12/02/23   Magdalen Pasco RAMAN, DPM  rosuvastatin  (CRESTOR ) 10 MG tablet Take 1 tablet (10 mg total) by mouth daily. 08/06/23   [name], [name], NP  SUMAtriptan  (IMITREX ) 100 MG tablet Take 1 tablet (100 mg total) by mouth as needed for migraine. May repeat in 2 hours if headache persists or recurs. 07/17/22   [name], [name], NP  Syringe/Needle, Disp, (SYRINGE 3CC/22GX1-1/2) 22G X 1-1/2 3 ML MISC Use as directed 04/26/23   [name], [name], NP  timolol (TIMOPTIC) 0.5 % ophthalmic solution 1 drop every morning. 07/19/22   [provider]  tirzepatide  (MOUNJARO ) 7.5 MG/0.5ML Pen Inject 7.5 mg into the skin once a week. 01/06/24   Danford, Rockie D, NP  TRAVATAN Z 0.004 % SOLN ophthalmic solution Place 1 drop into both eyes at bedtime.  10/25/11   [provider]  valsartan  (DIOVAN ) 320 MG tablet Take 1 tablet (320 mg total) by mouth daily. 08/06/23   [name], [name], NP  Vitamin D , Ergocalciferol , (DRISDOL ) 1.25 MG (50000 UNIT) CAPS capsule  Take 1 capsule (50,000 Units total) by mouth every 7 (seven) days. 12/24/23   Jonel Rockie D, NP    Physical Exam: BP (!) 142/86 (BP Location: Right Arm)   Pulse 85   Temp 98.7 F (37.1 C) (Oral)   Resp 18   Ht 5' (1.524 m)   Wt 75.8 kg   LMP 08/09/2016   SpO2 100%   BMI 32.61 kg/m  General: Pleasant, well-appearing woman laying in bed. No acute distress. HEENT: [place]/AT. Anicteric sclera CV: RRR. No murmurs, rubs, or gallops. No LE edema Pulmonary: On 2 L [place]. Lungs CTAB. Left chest pain with deep breaths. No wheezing or rales. Decreased air movement. Abdominal: Soft, nontender, nondistended. Normal bowel sounds. Extremities: Palpable radial and DP pulses. Normal ROM. Skin: Warm and dry. No obvious rash or lesions. Neuro: A&Ox3. Moves all extremities. Normal sensation to light touch. No focal deficit. Psych: Normal mood and affect          Labs on Admission:  Basic Metabolic Panel: Recent Labs  Lab 01/17/24 1700  NA 135  K 3.7  CL 102  CO2 23  GLUCOSE 86  BUN 14  CREATININE 0.74  CALCIUM  9.6   Liver Function Tests: No results for input(s): AST, ALT, ALKPHOS, BILITOT, PROT, ALBUMIN in the last 168 hours. No results for input(s): LIPASE, AMYLASE in the last 168 hours. No results for input(s): AMMONIA in the last 168 hours. CBC: Recent Labs  Lab 01/17/24 1700 01/18/24 0259  WBC 6.8 4.6  HGB 12.7 12.3  HCT 39.6 38.0  MCV 87.2 87.8  PLT 171 172   Cardiac Enzymes: No results for input(s): CKTOTAL, CKMB, CKMBINDEX, TROPONINI in the last 168 hours. BNP (last 3 results) No results for input(s): BNP in the last 8760 hours.  ProBNP (last 3 results) Recent Labs    01/17/24 1700  PROBNP 63.6    CBG: No results for input(s): GLUCAP in the last 168 hours.  Radiological Exams on Admission: CT Angio Chest PE W and/or Wo Contrast Result Date: 01/17/2024 EXAM: CTA CHEST 01/17/2024 07:44:20 PM TECHNIQUE: CTA of the chest was performed  after the administration of intravenous contrast. Multiplanar reformatted images are provided for review. MIP  images are provided for review. Automated exposure control, iterative reconstruction, and/or weight based adjustment of the mA/kV was utilized to reduce the radiation dose to as low as reasonably achievable. COMPARISON: None available. CLINICAL HISTORY: Pulmonary embolism (PE) suspected, high prob. FINDINGS: PULMONARY ARTERIES: Pulmonary arteries are adequately opacified for evaluation. Pulmonary emboli are seen in the distal right main pulmonary artery extending into lobar and segmental branches of the right middle lobe and right lower lobe. There are also segmental right upper lobe pulmonary emboli. There are subsegmental left lower lobe pulmonary emboli and segmental and subsegmental left upper lobe pulmonary emboli. Main pulmonary artery is normal in caliber. MEDIASTINUM: The heart and pericardium demonstrate no acute abnormality. There is no acute abnormality of the thoracic aorta. LYMPH NODES: No mediastinal, hilar or axillary lymphadenopathy. LUNGS AND PLEURA: There is some mild patchy airspace disease in the bilateral lung bases. There is a trace left pleural effusion. No pneumothorax. UPPER ABDOMEN: Limited images of the upper abdomen are unremarkable. SOFT TISSUES AND BONES: No acute bone or soft tissue abnormality. IMPRESSION: 1. Pulmonary emboli in the distal right main pulmonary artery extending into lobar and segmental branches of the right middle and right lower lobes, as well as segmental right upper lobe pulmonary emboli, with additional segmental and subsegmental left upper lobe and subsegmental left lower lobe pulmonary emboli. CT evidence of right heart strain (RV/LV Ratio = 1.0) consistent with at least submassive (intermediate risk) PE. The presence of right heart strain has been associated with an increased risk of morbidity and mortality. Please activate Code PE / PE Focused [place] set  in [facility] for further guidance. 2. Mild patchy airspace disease in the bilateral lung bases. 3. Trace left pleural effusion. Electronically signed by: Greig Pique MD 01/17/2024 07:53 PM EST RP Workstation: [id number]   DG Chest 2 View Result Date: 01/17/2024 EXAM: 2 VIEW(S) XRAY OF THE CHEST 01/17/2024 05:27:00 PM COMPARISON: None available. CLINICAL HISTORY: shortness of breath FINDINGS: LUNGS AND PLEURA: New minimal left lower lobe airspace disease. No pleural effusion. No pneumothorax. HEART AND MEDIASTINUM: No acute abnormality of the cardiac and mediastinal silhouettes. BONES AND SOFT TISSUES: No acute osseous abnormality. IMPRESSION: 1. New minimal left lower lobe airspace disease. Electronically signed by: Greig Pique MD 01/17/2024 05:51 PM EST RP Workstation: [id number]   Assessment/Plan Amber [name] is a 64 y.o. female with medical history significant for CAD, T2DM, chronic diastolic HF, GERD, HTN, HLD, diabetic peripheral neuropathy, rheumatoid arthritis, IDA and OSA on CPAP who presented with [facility] ED for evaluation of shortness of breath and pleuritic chest pain and admitted for acute pulmonary embolism.  # Pulmonary embolism # Pleuritic chest pain - Patient presented with 5 days of progressive shortness of breath with associated pleuritic chest pain - CTA chest PE study shows recurrent pulmonary embolism bilaterally with right heart strain - Patient with new O2 requirement and significant chest pain with deep breaths but remains normotensive with normal troponin - No clear provoking factors have been identified for her PE, will likely need thromboembolic workup in the outpatient - Admitted to progressive floor for close monitoring - Continue on heparin  drip - Check echocardiogram - Pain control with as needed oxycodone  and IV Dilaudid  - Telemetry  # Acute hypoxic respiratory failure - Patient found to be hypoxic requiring 2 L [place] - This is secondary to acute  pulmonary embolism - Continue supplemental O2, wean as able  # HTN # Chronic diastolic HF - BP stable with SBP in the  110-140s - Patient euvolemic on exam - Continue Coreg , diltiazem , triamterene  and hydrochlorothiazide   # T2DM - Well-controlled with A1c of 5.6% 3 weeks ago - Regular diet  # IDA - Hgb stable at 12.3 - Continue iron  supplementation  # HLD - Continue rosuvastatin   # CAD - Continue aspirin  and rosuvastatin   # OSA - Continue CPAP at bedtime  DVT prophylaxis: Heparin     Code Status: Full Code  Consults called: None  Family Communication: No family at bedside  Severity of Illness: The appropriate patient status for this patient is INPATIENT. Inpatient status is judged to be reasonable and necessary in order to provide the required intensity of service to ensure the patient's safety. The patient's presenting symptoms, physical exam findings, and initial radiographic and laboratory data in the context of their chronic comorbidities is felt to place them at high risk for further clinical deterioration. Furthermore, it is not anticipated that the patient will be medically stable for discharge from the hospital within 2 midnights of admission.   * I certify that at the point of admission it is my clinical judgment that the patient will require inpatient hospital care spanning beyond 2 midnights from the point of admission due to high intensity of service, high risk for further deterioration and high frequency of surveillance required.*  Level of care: Progressive   I personally spent a total of 75 minutes in the care of the patient today including preparing to see the patient, getting/reviewing separately obtained history, performing a medically appropriate exam/evaluation, placing orders, and documenting clinical information in the EHR.   Amber Claretta HERO, MD 01/18/2024, 8:59 AM [facility] Pager: [phone] Isaiah 41:10   If 7PM-7AM, please contact  night-coverage [email or web address] Password [email or web address]     [1]  Allergies Allergen Reactions   Gabapentin      Made her crazy   "

## 2024-01-18 NOTE — ED Notes (Signed)
 [facility] called for transport.

## 2024-01-18 NOTE — ED Notes (Signed)
 Transfer of facility report called to Barista at [facility].  Discussed pt Hx, Dx, Sx, current condition, lines, labs, imaging, vitals.  All questions asked were answered.    Transfer of care report given at bedside to [facility] transport team.  Discussed pt Hx, Dx, Sx, current condition, lines, labs, imaging, vitals.  All questions asked were answered.

## 2024-01-18 NOTE — Progress Notes (Signed)
 PHARMACY - ANTICOAGULATION CONSULT NOTE  Pharmacy Consult for heparin  Indication: pulmonary embolus  Labs: Recent Labs    01/17/24 1700 01/18/24 0259 01/18/24 0355  HGB 12.7 12.3  --   HCT 39.6 38.0  --   PLT 171 172  --   HEPARINUNFRC  --   --  0.59  CREATININE 0.74  --   --    Assessment/Plan:  64yo female therapeutic on heparin  with initial dosing for PE. Will continue infusion at current rate of 1050 units/hr and confirm stable with additional level.  Marvetta Dauphin, PharmD, BCPS 01/18/2024 5:13 AM

## 2024-01-18 NOTE — Progress Notes (Signed)
" °   01/18/24 2327  BiPAP/CPAP/SIPAP  $ Non-Invasive Home Ventilator  Initial  BiPAP/CPAP/SIPAP Pt Type Adult  BiPAP/CPAP/SIPAP Resmed  Mask Type Nasal mask  Mask Size Small  Respiratory Rate 20 breaths/min  Flow Rate 2 lpm  Patient Home Machine No  Patient Home Mask No  Patient Home Tubing No  Auto Titrate Yes  Minimum cmH2O 5 cmH2O  Maximum cmH2O 15 cmH2O  Nasal massage performed Yes  CPAP/SIPAP surface wiped down Yes  Device Plugged into RED Power Outlet Yes  BiPAP/CPAP /SiPAP Vitals  Pulse Rate 75  Resp 20  SpO2 98 %  Bilateral Breath Sounds Diminished    "

## 2024-01-19 ENCOUNTER — Inpatient Hospital Stay (HOSPITAL_COMMUNITY)

## 2024-01-19 DIAGNOSIS — I2609 Other pulmonary embolism with acute cor pulmonale: Secondary | ICD-10-CM

## 2024-01-19 DIAGNOSIS — M7989 Other specified soft tissue disorders: Secondary | ICD-10-CM

## 2024-01-19 LAB — CBC
HCT: 38.6 % (ref 36.0–46.0)
Hemoglobin: 12.2 g/dL (ref 12.0–15.0)
MCH: 28 pg (ref 26.0–34.0)
MCHC: 31.6 g/dL (ref 30.0–36.0)
MCV: 88.7 fL (ref 80.0–100.0)
Platelets: 183 K/uL (ref 150–400)
RBC: 4.35 MIL/uL (ref 3.87–5.11)
RDW: 14.6 % (ref 11.5–15.5)
WBC: 4.1 K/uL (ref 4.0–10.5)
nRBC: 0 % (ref 0.0–0.2)

## 2024-01-19 LAB — ECHOCARDIOGRAM COMPLETE
Area-P 1/2: 4.39 cm2
Calc EF: 61.7 %
Height: 60 in
S' Lateral: 2.5 cm
Single Plane A2C EF: 59.5 %
Single Plane A4C EF: 67 %
Weight: 2672 [oz_av]

## 2024-01-19 LAB — COMPREHENSIVE METABOLIC PANEL WITH GFR
ALT: 15 U/L (ref 0–44)
AST: 21 U/L (ref 15–41)
Albumin: 3.6 g/dL (ref 3.5–5.0)
Alkaline Phosphatase: 63 U/L (ref 38–126)
Anion gap: 10 (ref 5–15)
BUN: 12 mg/dL (ref 8–23)
CO2: 22 mmol/L (ref 22–32)
Calcium: 9.9 mg/dL (ref 8.9–10.3)
Chloride: 103 mmol/L (ref 98–111)
Creatinine, Ser: 0.81 mg/dL (ref 0.44–1.00)
GFR, Estimated: >60 mL/min
Glucose, Bld: 93 mg/dL (ref 70–99)
Potassium: 4 mmol/L (ref 3.5–5.1)
Sodium: 136 mmol/L (ref 135–145)
Total Bilirubin: 0.5 mg/dL (ref 0.0–1.2)
Total Protein: 7.5 g/dL (ref 6.5–8.1)

## 2024-01-19 LAB — HEPARIN LEVEL (UNFRACTIONATED): Heparin Unfractionated: 0.64 [IU]/mL (ref 0.30–0.70)

## 2024-01-19 LAB — HIV ANTIBODY (ROUTINE TESTING W REFLEX): HIV Screen 4th Generation wRfx: NONREACTIVE

## 2024-01-19 NOTE — Plan of Care (Signed)

## 2024-01-19 NOTE — Plan of Care (Signed)

## 2024-01-19 NOTE — Progress Notes (Signed)
" °   01/19/24 2253  BiPAP/CPAP/SIPAP  BiPAP/CPAP/SIPAP Pt Type Adult  BiPAP/CPAP/SIPAP Resmed  Mask Type Nasal mask  Mask Size Small  Flow Rate 1 lpm  Patient Home Machine No  Patient Home Mask No  Patient Home Tubing No  Auto Titrate Yes  Minimum cmH2O 5 cmH2O  Maximum cmH2O 15 cmH2O  Device Plugged into RED Power Outlet Yes  BiPAP/CPAP /SiPAP Vitals  Resp (!) 22  MEWS Score/Color  MEWS Score 1  MEWS Score Color Green    "

## 2024-01-19 NOTE — Progress Notes (Signed)
 " PROGRESS NOTE  Amber MCCONAHY FMW:[id number] DOB: 12/05/1960 DOA: 01/17/2024 PCP: Daryl Setter, NP   LOS: 1 day   Brief Narrative / Interim history: 64 year old female with history of CAD, DM 2, chronic diastolic CHF, HTN, HLD, RA, OSA on CPAP who comes into the hospital with shortness of breath and pleuritic chest pain for most part of the last week.  In the ED she was found to have a large PE with evidence of right heart strain and she was admitted to the hospital  Subjective / 24h Interval events: Continues to complain of chest discomfort with movement and deep breathing  Assesement and Plan: Principal problem Acute PE, chest pain -patient presented with 5 days of progressive shortness of breath, pleuritic chest pain.  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  Troponin is normal, given evidence of right heart strain on the CT scan we will get a 2D echocardiogram - Obtain lower extremity Dopplers  Active problems Acute hypoxic respiratory failure -patient found to be tachypneic, shallow breathing, splinting due to pain and hypoxemic requiring 2 L.  This is due to #1.  Wean off to room air as tolerated  Essential hypertension-blood pressure stable, continue Coreg , diltiazem   Hyperlipidemia-continue statin  DM2-well-controlled, A1c 5.6 few weeks ago.  IDA-hemoglobin stable, continue iron   CAD-no ACS type chest pain, continue aspirin , statin  OSA-continue CPAP  Obesity, class I-BMI 32, she would benefit from weight loss  Scheduled Meds:  aspirin  EC  81 mg Oral Q1200   calcium  carbonate  1 tablet Oral Daily   And   cholecalciferol   200 Units Oral Daily   carvedilol   12.5 mg Oral BID WC   diltiazem   240 mg Oral Daily   ferrous sulfate   325 mg Oral BID   latanoprost   1 drop Both Eyes QHS   rosuvastatin   10 mg Oral Daily   triamterene -hydrochlorothiazide   1 tablet Oral Daily   Continuous Infusions:  heparin  1,050  Units/hr (01/18/24 1730)   PRN Meds:.acetaminophen  **OR** acetaminophen , bisacodyl , HYDROmorphone  (DILAUDID ) injection, lip balm, ondansetron  **OR** ondansetron  (ZOFRAN ) IV, oxyCODONE , senna-docusate  Current Outpatient Medications  Medication Instructions   albuterol  (VENTOLIN  HFA) 108 (90 Base) MCG/ACT inhaler 2 puffs, Inhalation, Every 6 hours PRN   Aspirin  81 mg, Oral, Daily   brimonidine-timolol (COMBIGAN) 0.2-0.5 % ophthalmic solution 1 drop, Both Eyes, Every 12 hours   calcium -vitamin D  (OSCAL WITH D) 500-200 MG-UNIT TABS tablet 1 tablet, Oral, Daily   carvedilol  (COREG ) 12.5 mg, Oral, 2 times daily with meals   cyanocobalamin  (VITAMIN B12) 1000 MCG/ML injection 1000mcg IM weekly x 4 weeks then once monthly   diltiazem  (CARTIA  XT) 240 mg, Oral, Daily   gabapentin  (NEURONTIN ) 100 mg, Oral, Daily at bedtime   hydrochlorothiazide  (HYDRODIURIL ) 25 mg, Oral, Daily   Iron  (Ferrous Sulfate ) 325 mg, Oral, 2 times daily   meloxicam  (MOBIC ) 7.5 mg, Oral, Daily   nystatin  (MYCOSTATIN /NYSTOP ) powder 1 Application, Topical, 3 times daily   ORENCIA  CLICKJECT 125 MG/ML SOAJ INJECT 125 MG UNDER THE SKIN EVERY 7 DAYS   polyethylene glycol (MIRALAX  / GLYCOLAX ) 17 g, Oral, 2 times daily, Until stooling regularly. Then once daily or prn   predniSONE  (DELTASONE ) 10 MG tablet 12 day tapering dose   rosuvastatin  (CRESTOR ) 10 mg, Oral, Daily   SUMAtriptan  (IMITREX ) 100 mg, Oral, As needed, May repeat in 2 hours if headache persists or recurs.   Syringe/Needle, Disp, (SYRINGE 3CC/22GX1-1/2) 22G X 1-1/2 3 ML MISC Use as directed  tirzepatide  (MOUNJARO ) 7.5 mg, Subcutaneous, Weekly   TRAVATAN Z 0.004 % SOLN ophthalmic solution 1 drop, Both Eyes, Daily at bedtime   triamterene -hydrochlorothiazide  (MAXZIDE -25) 37.5-25 MG tablet 1 tablet, Oral, Daily   valsartan  (DIOVAN ) 320 mg, Oral, Daily   Vitamin D  (Ergocalciferol ) (DRISDOL ) 50,000 Units, Oral, Every 7 days    Diet Orders (From admission, onward)      Start     Ordered   01/18/24 0755  Diet regular Room service appropriate? Yes; Fluid consistency: Thin  Diet effective now       Question Answer Comment  Room service appropriate? Yes   Fluid consistency: Thin      01/18/24 0754            DVT prophylaxis:    Lab Results  Component Value Date   PLT 183 01/19/2024      Code Status: Full Code  Family Communication: No family at bedside  Status is: Inpatient  Level of care: Progressive  Consultants:  none  Objective: Vitals:   01/18/24 1740 01/18/24 2021 01/18/24 2327 01/19/24 0420  BP: 133/83 114/73  105/70  Pulse: 94 91 75 77  Resp: 16 17 20 19   Temp: 99.7 F (37.6 C) 98.5 F (36.9 C)  97.9 F (36.6 C)  TempSrc: Oral Oral  Oral  SpO2: 99% 99% 98% 98%  Weight:      Height:        Intake/Output Summary (Last 24 hours) at 01/19/2024 1040 Last data filed at 01/19/2024 0500 Gross per 24 hour  Intake 731.38 ml  Output --  Net 731.38 ml   Wt Readings from Last 3 Encounters:  01/17/24 75.8 kg  12/24/23 75.3 kg  12/20/23 78 kg    Examination:  Constitutional: NAD Eyes: no scleral icterus ENMT: Mucous membranes are moist.  Neck: normal, supple Respiratory: clear to auscultation bilaterally, no wheezing, no crackles.  Shallow breathing, splinting Cardiovascular: Regular rate and rhythm, no murmurs / rubs / gallops. No LE edema.  Abdomen: non distended, no tenderness. Bowel sounds positive.  Musculoskeletal: no clubbing / cyanosis.    Data Reviewed: I have independently reviewed following labs and imaging studies   CBC Recent Labs  Lab 01/17/24 1700 01/18/24 0259 01/19/24 0522  WBC 6.8 4.6 4.1  HGB 12.7 12.3 12.2  HCT 39.6 38.0 38.6  PLT 171 172 183  MCV 87.2 87.8 88.7  MCH 28.0 28.4 28.0  MCHC 32.1 32.4 31.6  RDW 14.6 14.6 14.6    Recent Labs  Lab 01/17/24 1700 01/17/24 1846 01/19/24 0522  NA 135  --  136  K 3.7  --  4.0  CL 102  --  103  CO2 23  --  22  GLUCOSE 86  --  93  BUN  14  --  12  CREATININE 0.74  --  0.81  CALCIUM  9.6  --  9.9  AST  --   --  21  ALT  --   --  15  ALKPHOS  --   --  63  BILITOT  --   --  0.5  ALBUMIN  --   --  3.6  DDIMER  --  3.61*  --     ------------------------------------------------------------------------------------------------------------------ No results for input(s): CHOL, HDL, LDLCALC, TRIG, CHOLHDL, LDLDIRECT in the last 72 hours.  Lab Results  Component Value Date   HGBA1C 5.6 12/24/2023   ------------------------------------------------------------------------------------------------------------------ No results for input(s): TSH, T4TOTAL, T3FREE, THYROIDAB in the last 72 hours.  Invalid input(s): FREET3  Cardiac  Enzymes No results for input(s): CKMB, TROPONINI, MYOGLOBIN in the last 168 hours.  Invalid input(s): CK ------------------------------------------------------------------------------------------------------------------ No results found for: BNP  CBG: No results for input(s): GLUCAP in the last 168 hours.  Recent Results (from the past 240 hours)  Resp panel by RT-PCR (RSV, Flu A&B, Covid) Anterior Nasal Swab     Status: None   Collection Time: 01/17/24  5:02 PM   Specimen: Anterior Nasal Swab  Result Value Ref Range Status   SARS Coronavirus 2 by RT PCR NEGATIVE NEGATIVE Final    Comment: (NOTE) SARS-CoV-2 target nucleic acids are NOT DETECTED.  The SARS-CoV-2 RNA is generally detectable in upper respiratory specimens during the acute phase of infection. The lowest concentration of SARS-CoV-2 viral copies this assay can detect is 138 copies/mL. A negative result does not preclude SARS-Cov-2 infection and should not be used as the sole basis for treatment or other patient management decisions. A negative result may occur with  improper specimen collection/handling, submission of specimen other than nasopharyngeal swab, presence of viral mutation(s) within  the areas targeted by this assay, and inadequate number of viral copies(<138 copies/mL). A negative result must be combined with clinical observations, patient history, and epidemiological information. The expected result is Negative.  Fact Sheet for Patients:  bloggercourse.com  Fact Sheet for Healthcare Providers:  seriousbroker.it  This test is no t yet approved or cleared by the [place]  FDA and  has been authorized for detection and/or diagnosis of SARS-CoV-2 by FDA under an Emergency Use Authorization (EUA). This EUA will remain  in effect (meaning this test can be used) for the duration of the COVID-19 declaration under Section 564(b)(1) of the Act, 21 U.S.C.section 360bbb-3(b)(1), unless the authorization is terminated  or revoked sooner.       Influenza A by PCR NEGATIVE NEGATIVE Final   Influenza B by PCR NEGATIVE NEGATIVE Final    Comment: (NOTE) The Xpert Xpress SARS-CoV-2/FLU/RSV plus assay is intended as an aid in the diagnosis of influenza from Nasopharyngeal swab specimens and should not be used as a sole basis for treatment. Nasal washings and aspirates are unacceptable for Xpert Xpress SARS-CoV-2/FLU/RSV testing.  Fact Sheet for Patients: bloggercourse.com  Fact Sheet for Healthcare Providers: seriousbroker.it  This test is not yet approved or cleared by the [place]  FDA and has been authorized for detection and/or diagnosis of SARS-CoV-2 by FDA under an Emergency Use Authorization (EUA). This EUA will remain in effect (meaning this test can be used) for the duration of the COVID-19 declaration under Section 564(b)(1) of the Act, 21 U.S.C. section 360bbb-3(b)(1), unless the authorization is terminated or revoked.     Resp Syncytial Virus by PCR NEGATIVE NEGATIVE Final    Comment: (NOTE) Fact Sheet for  Patients: bloggercourse.com  Fact Sheet for Healthcare Providers: seriousbroker.it  This test is not yet approved or cleared by the [place]  FDA and has been authorized for detection and/or diagnosis of SARS-CoV-2 by FDA under an Emergency Use Authorization (EUA). This EUA will remain in effect (meaning this test can be used) for the duration of the COVID-19 declaration under Section 564(b)(1) of the Act, 21 U.S.C. section 360bbb-3(b)(1), unless the authorization is terminated or revoked.  Performed at [facility], [address] [place], [place] [address]      Radiology Studies: No results found.   Nilda Fendt, MD, PhD [facility]  Between 7 am - 7 pm I am available, please contact me via Amion (for emergencies) or  Securechat (non urgent messages)  Between 7 pm - 7 am I am not available, please contact night coverage MD/APP via Amion  "

## 2024-01-19 NOTE — Progress Notes (Signed)
 PHARMACY - ANTICOAGULATION CONSULT NOTE  Pharmacy Consult for IV heparin  Indication: pulmonary embolus  Allergies[1]  Patient Measurements: Height: 5' (152.4 cm) Weight: 75.8 kg (167 lb) IBW/kg (Calculated) : 45.5 HEPARIN  DW (KG): 62.5  Vital Signs: Temp: 97.9 F (36.6 C) (01/18 0420) Temp Source: Oral (01/18 0420) BP: 105/70 (01/18 0420) Pulse Rate: 77 (01/18 0420)  Labs: Recent Labs    01/17/24 1700 01/18/24 0259 01/18/24 0355 01/18/24 1015 01/19/24 0522  HGB 12.7 12.3  --   --  12.2  HCT 39.6 38.0  --   --  38.6  PLT 171 172  --   --  183  HEPARINUNFRC  --   --  0.59 0.33 0.64  CREATININE 0.74  --   --   --   --     Estimated Creatinine Clearance: 66.3 mL/min (by C-G formula based on SCr of 0.74 mg/dL).   Medical History: Past Medical History:  Diagnosis Date   Anemia    Arthritis    Asthma 05/03/2020   Back pain    Blood transfusion without reported diagnosis    CAD (coronary artery disease) 04/24/2017   Cataract    Cerebrovascular disease 10/13/2020   Chronic diastolic CHF (congestive heart failure) (HCC) 04/24/2017   Colon polyps    Controlled type 2 diabetes mellitus without complication, without long-term current use of insulin  (HCC) 05/31/2017   Diabetic peripheral neuropathy (HCC) 01/05/2020   Ectopic pregnancy    RIGHT salpingostomy   Elective abortion    Elective abortion 09/26/2020   RIGHT salpingostomy   Essential hypertension 09/04/2017   GERD (gastroesophageal reflux disease)    Glaucoma    both eyes   Headache    migraines   History of chicken pox    History of chicken pox 09/26/2020   migraines   Hyperlipemia    Hypertension    Iron  deficiency anemia 09/02/2013   Joint pain    Migraine 04/19/2014   Obesity (BMI 30-39.9) 10/17/2012   Palpitations 10/01/2012   Pre-diabetes    Rheumatoid arthritis (HCC) 11/07/2011   Rheumatoid arthritis(714.0) 11/07/2011   SAB (spontaneous abortion)    SAB (spontaneous abortion) 11/07/2011    Shortness of breath    Sjogren's disease    Swelling of lower extremity    Vitamin B12 deficiency    Wheezing     Medications:  Medications Prior to Admission  Medication Sig Dispense Refill Last Dose/Taking   albuterol  (VENTOLIN  HFA) 108 (90 Base) MCG/ACT inhaler Inhale 2 puffs into the lungs every 6 (six) hours as needed. (Patient taking differently: Inhale 2 puffs into the lungs every 6 (six) hours as needed for shortness of breath or wheezing.) 18 g 0 Unknown   Aspirin  81 MG CAPS Take 81 mg by mouth daily at 12 noon.   01/17/2024   brimonidine-timolol (COMBIGAN) 0.2-0.5 % ophthalmic solution Place 1 drop into both eyes every 12 (twelve) hours.   Past Week   calcium -vitamin D  (OSCAL WITH D) 500-200 MG-UNIT TABS tablet Take 1 tablet by mouth daily.   01/17/2024   carvedilol  (COREG ) 12.5 MG tablet Take 1 tablet (12.5 mg total) by mouth 2 (two) times daily with a meal. 180 tablet 1 01/17/2024   diltiazem  (CARTIA  XT) 240 MG 24 hr capsule Take 1 capsule (240 mg total) by mouth daily. 90 capsule 1 01/17/2024   Iron , Ferrous Sulfate , 325 (65 Fe) MG TABS Take 325 mg by mouth 2 (two) times daily. 30 tablet  01/17/2024   meloxicam  (MOBIC ) 7.5 MG  tablet Take 1 tablet (7.5 mg total) by mouth daily. (Patient taking differently: Take 7.5 mg by mouth daily as needed for pain.) 14 tablet 0 Past Week   nystatin  (MYCOSTATIN /NYSTOP ) powder Apply 1 Application topically 3 (three) times daily. (Patient taking differently: Apply 1 Application topically 2 (two) times daily.) 30 g 1 Past Week   ORENCIA  CLICKJECT 125 MG/ML SOAJ INJECT 125 MG UNDER THE SKIN EVERY 7 DAYS (Patient taking differently: Inject 125 mg into the skin See admin instructions. Every 10 days) 4 mL 1 01/14/2024   polyethylene glycol (MIRALAX  / GLYCOLAX ) 17 g packet Take 17 g by mouth 2 (two) times daily. Until stooling regularly. Then once daily or prn (Patient taking differently: Take 17 g by mouth daily as needed for mild constipation, moderate  constipation or severe constipation.)   Past Month   rosuvastatin  (CRESTOR ) 10 MG tablet Take 1 tablet (10 mg total) by mouth daily. 90 tablet 1 01/17/2024   SUMAtriptan  (IMITREX ) 100 MG tablet Take 1 tablet (100 mg total) by mouth as needed for migraine. May repeat in 2 hours if headache persists or recurs. 30 tablet 5 Unknown   tirzepatide  (MOUNJARO ) 7.5 MG/0.5ML Pen Inject 7.5 mg into the skin once a week. 6 mL 0 Past Week   TRAVATAN Z 0.004 % SOLN ophthalmic solution Place 1 drop into both eyes at bedtime.    Past Week   triamterene -hydrochlorothiazide  (MAXZIDE -25) 37.5-25 MG tablet Take 1 tablet by mouth daily.   01/17/2024   valsartan  (DIOVAN ) 320 MG tablet Take 1 tablet (320 mg total) by mouth daily. 90 tablet 1 01/17/2024   Vitamin D , Ergocalciferol , (DRISDOL ) 1.25 MG (50000 UNIT) CAPS capsule Take 1 capsule (50,000 Units total) by mouth every 7 (seven) days. 13 capsule 0 Past Week   cyanocobalamin  (VITAMIN B12) 1000 MCG/ML injection 1000mcg IM weekly x 4 weeks then once monthly (Patient not taking: Reported on 01/18/2024) 12 mL 0 Not Taking   gabapentin  (NEURONTIN ) 100 MG capsule Take 1 capsule (100 mg total) by mouth at bedtime. (Patient not taking: Reported on 12/24/2023) 90 capsule 1 Not Taking   hydrochlorothiazide  (HYDRODIURIL ) 25 MG tablet Take 1 tablet (25 mg total) by mouth daily. (Patient not taking: Reported on 01/18/2024) 90 tablet 0 Not Taking   predniSONE  (DELTASONE ) 10 MG tablet 12 day tapering dose (Patient not taking: Reported on 01/18/2024) 48 tablet 0 Not Taking   Syringe/Needle, Disp, (SYRINGE 3CC/22GX1-1/2) 22G X 1-1/2 3 ML MISC Use as directed 50 each 0     Assessment: 64 yo F presenting with SOB, found to have PE with R heart strain. Not on AC PTA. Elevated D-dimer on admission. Pharmacy consulted to dose heparin .  01/19/24 Heparin  level = 0.64 (therapeutic) with heparin  gtt @ 1050 units/hr CBC stable: wnl no transfusion issues or bleeding noted  Goal of Therapy:   Heparin  level 0.3-0.7 units/ml Monitor platelets by anticoagulation protocol: Yes   Plan:  Continue heparin  infusion at 1050 units/hr Monitor CBC and heparin  level daily Continue to monitor signs and symptoms of bleeding   Thank you for allowing pharmacy to be a part of this patients care.  Delma Villalva, PharmD Clinical Pharmacist 01/19/2024 6:51 AM             [1]  Allergies Allergen Reactions   Gabapentin  Other (See Comments)    Made her crazy

## 2024-01-19 NOTE — Progress Notes (Signed)
 Bilateral lower extremity venous duplex has been completed.  Results can be found in chart review under CV Proc.  01/19/2024 2:14 PM  Candiss Galeana Elden Appl, RVT.

## 2024-01-20 ENCOUNTER — Encounter: Payer: Self-pay | Admitting: Oncology

## 2024-01-20 ENCOUNTER — Telehealth (HOSPITAL_COMMUNITY): Payer: Self-pay

## 2024-01-20 ENCOUNTER — Other Ambulatory Visit (HOSPITAL_COMMUNITY): Payer: Self-pay

## 2024-01-20 LAB — CBC
HCT: 38.7 % (ref 36.0–46.0)
Hemoglobin: 12.3 g/dL (ref 12.0–15.0)
MCH: 28 pg (ref 26.0–34.0)
MCHC: 31.8 g/dL (ref 30.0–36.0)
MCV: 88.2 fL (ref 80.0–100.0)
Platelets: 183 K/uL (ref 150–400)
RBC: 4.39 MIL/uL (ref 3.87–5.11)
RDW: 14.4 % (ref 11.5–15.5)
WBC: 4.1 K/uL (ref 4.0–10.5)
nRBC: 0 % (ref 0.0–0.2)

## 2024-01-20 LAB — BASIC METABOLIC PANEL WITH GFR
Anion gap: 9 (ref 5–15)
BUN: 11 mg/dL (ref 8–23)
CO2: 24 mmol/L (ref 22–32)
Calcium: 9.9 mg/dL (ref 8.9–10.3)
Chloride: 101 mmol/L (ref 98–111)
Creatinine, Ser: 0.68 mg/dL (ref 0.44–1.00)
GFR, Estimated: >60 mL/min
Glucose, Bld: 103 mg/dL — ABNORMAL HIGH (ref 70–99)
Potassium: 4.1 mmol/L (ref 3.5–5.1)
Sodium: 134 mmol/L — ABNORMAL LOW (ref 135–145)

## 2024-01-20 LAB — MAGNESIUM: Magnesium: 1.9 mg/dL (ref 1.7–2.4)

## 2024-01-20 LAB — HEPARIN LEVEL (UNFRACTIONATED): Heparin Unfractionated: 0.43 [IU]/mL (ref 0.30–0.70)

## 2024-01-20 MED ORDER — APIXABAN 5 MG PO TABS: 5.0000 mg | ORAL_TABLET | Freq: Two times a day (BID) | ORAL | Status: DC

## 2024-01-20 MED ORDER — APIXABAN 5 MG PO TABS
10.0000 mg | ORAL_TABLET | Freq: Two times a day (BID) | ORAL | Status: DC
  Administered 2024-01-20 – 2024-01-21 (×3): 10 mg via ORAL
  Filled 2024-01-20 (×3): qty 2

## 2024-01-20 NOTE — Plan of Care (Signed)

## 2024-01-20 NOTE — Progress Notes (Addendum)
 PHARMACY - ANTICOAGULATION CONSULT NOTE  Pharmacy Consult for IV heparin  Indication: pulmonary embolus  Allergies[1]  Patient Measurements: Height: 5' (152.4 cm) Weight: 75.8 kg (167 lb) IBW/kg (Calculated) : 45.5 HEPARIN  DW (KG): 62.5  Vital Signs: Temp: 97.9 F (36.6 C) (01/19 0428) Temp Source: Oral (01/19 0428) BP: 112/75 (01/19 0428) Pulse Rate: 77 (01/19 0428)  Labs: Recent Labs    01/17/24 1700 01/18/24 0259 01/18/24 0355 01/18/24 1015 01/19/24 0522 01/20/24 0432  HGB 12.7 12.3  --   --  12.2 12.3  HCT 39.6 38.0  --   --  38.6 38.7  PLT 171 172  --   --  183 183  HEPARINUNFRC  --   --    < > 0.33 0.64 0.43  CREATININE 0.74  --   --   --  0.81 0.68   < > = values in this interval not displayed.    Estimated Creatinine Clearance: 66.3 mL/min (by C-G formula based on SCr of 0.68 mg/dL).   Medical History: Past Medical History:  Diagnosis Date   Anemia    Arthritis    Asthma 05/03/2020   Back pain    Blood transfusion without reported diagnosis    CAD (coronary artery disease) 04/24/2017   Cataract    Cerebrovascular disease 10/13/2020   Chronic diastolic CHF (congestive heart failure) (HCC) 04/24/2017   Colon polyps    Controlled type 2 diabetes mellitus without complication, without long-term current use of insulin  (HCC) 05/31/2017   Diabetic peripheral neuropathy (HCC) 01/05/2020   Ectopic pregnancy    RIGHT salpingostomy   Elective abortion    Elective abortion 09/26/2020   RIGHT salpingostomy   Essential hypertension 09/04/2017   GERD (gastroesophageal reflux disease)    Glaucoma    both eyes   Headache    migraines   History of chicken pox    History of chicken pox 09/26/2020   migraines   Hyperlipemia    Hypertension    Iron  deficiency anemia 09/02/2013   Joint pain    Migraine 04/19/2014   Obesity (BMI 30-39.9) 10/17/2012   Palpitations 10/01/2012   Pre-diabetes    Rheumatoid arthritis (HCC) 11/07/2011   Rheumatoid  arthritis(714.0) 11/07/2011   SAB (spontaneous abortion)    SAB (spontaneous abortion) 11/07/2011   Shortness of breath    Sjogren's disease    Swelling of lower extremity    Vitamin B12 deficiency    Wheezing     Medications:  Medications Prior to Admission  Medication Sig Dispense Refill Last Dose/Taking   albuterol  (VENTOLIN  HFA) 108 (90 Base) MCG/ACT inhaler Inhale 2 puffs into the lungs every 6 (six) hours as needed. (Patient taking differently: Inhale 2 puffs into the lungs every 6 (six) hours as needed for shortness of breath or wheezing.) 18 g 0 Unknown   Aspirin  81 MG CAPS Take 81 mg by mouth daily at 12 noon.   01/17/2024   brimonidine-timolol (COMBIGAN) 0.2-0.5 % ophthalmic solution Place 1 drop into both eyes every 12 (twelve) hours.   Past Week   calcium -vitamin D  (OSCAL WITH D) 500-200 MG-UNIT TABS tablet Take 1 tablet by mouth daily.   01/17/2024   carvedilol  (COREG ) 12.5 MG tablet Take 1 tablet (12.5 mg total) by mouth 2 (two) times daily with a meal. 180 tablet 1 01/17/2024   diltiazem  (CARTIA  XT) 240 MG 24 hr capsule Take 1 capsule (240 mg total) by mouth daily. 90 capsule 1 01/17/2024   Iron , Ferrous Sulfate , 325 (65 Fe) MG TABS  Take 325 mg by mouth 2 (two) times daily. 30 tablet  01/17/2024   meloxicam  (MOBIC ) 7.5 MG tablet Take 1 tablet (7.5 mg total) by mouth daily. (Patient taking differently: Take 7.5 mg by mouth daily as needed for pain.) 14 tablet 0 Past Week   nystatin  (MYCOSTATIN /NYSTOP ) powder Apply 1 Application topically 3 (three) times daily. (Patient taking differently: Apply 1 Application topically 2 (two) times daily.) 30 g 1 Past Week   ORENCIA  CLICKJECT 125 MG/ML SOAJ INJECT 125 MG UNDER THE SKIN EVERY 7 DAYS (Patient taking differently: Inject 125 mg into the skin See admin instructions. Every 10 days) 4 mL 1 01/14/2024   polyethylene glycol (MIRALAX  / GLYCOLAX ) 17 g packet Take 17 g by mouth 2 (two) times daily. Until stooling regularly. Then once daily or prn  (Patient taking differently: Take 17 g by mouth daily as needed for mild constipation, moderate constipation or severe constipation.)   Past Month   rosuvastatin  (CRESTOR ) 10 MG tablet Take 1 tablet (10 mg total) by mouth daily. 90 tablet 1 01/17/2024   SUMAtriptan  (IMITREX ) 100 MG tablet Take 1 tablet (100 mg total) by mouth as needed for migraine. May repeat in 2 hours if headache persists or recurs. 30 tablet 5 Unknown   tirzepatide  (MOUNJARO ) 7.5 MG/0.5ML Pen Inject 7.5 mg into the skin once a week. 6 mL 0 Past Week   TRAVATAN Z 0.004 % SOLN ophthalmic solution Place 1 drop into both eyes at bedtime.    Past Week   triamterene -hydrochlorothiazide  (MAXZIDE -25) 37.5-25 MG tablet Take 1 tablet by mouth daily.   01/17/2024   valsartan  (DIOVAN ) 320 MG tablet Take 1 tablet (320 mg total) by mouth daily. 90 tablet 1 01/17/2024   Vitamin D , Ergocalciferol , (DRISDOL ) 1.25 MG (50000 UNIT) CAPS capsule Take 1 capsule (50,000 Units total) by mouth every 7 (seven) days. 13 capsule 0 Past Week   cyanocobalamin  (VITAMIN B12) 1000 MCG/ML injection 1000mcg IM weekly x 4 weeks then once monthly (Patient not taking: Reported on 01/18/2024) 12 mL 0 Not Taking   gabapentin  (NEURONTIN ) 100 MG capsule Take 1 capsule (100 mg total) by mouth at bedtime. (Patient not taking: Reported on 12/24/2023) 90 capsule 1 Not Taking   hydrochlorothiazide  (HYDRODIURIL ) 25 MG tablet Take 1 tablet (25 mg total) by mouth daily. (Patient not taking: Reported on 01/18/2024) 90 tablet 0 Not Taking   predniSONE  (DELTASONE ) 10 MG tablet 12 day tapering dose (Patient not taking: Reported on 01/18/2024) 48 tablet 0 Not Taking   Syringe/Needle, Disp, (SYRINGE 3CC/22GX1-1/2) 22G X 1-1/2 3 ML MISC Use as directed 50 each 0     Assessment: 64 yo F presenting with SOB, found to have PE with R heart strain. Dopplers negative. Not on AC PTA. Elevated D-dimer on admission. Pharmacy consulted to dose heparin .  01/20/24 Heparin  level = 0.43 (therapeutic)  with heparin  gtt at 1050 units/hr CBC stable: wnl No s/sx of bleeding reported  Goal of Therapy:  Heparin  level 0.3-0.7 units/ml Monitor platelets by anticoagulation protocol: Yes   Plan:  Continue heparin  infusion at 1050 units/hr Monitor CBC and heparin  level daily Continue to monitor signs and symptoms of bleeding  F/up long-term anticoagulation plan   ADDENDUM: Okay to transition to Eliquis  therapy per MD. Pharmacy consulted for Eliquis  dosing.  PLAN: Stop heparin  now Start Eliquis  10mg  BID x7 days, followed by 5mg  BID thereafter  Continue to monitor for s/sx of bleeding    Thank you for allowing pharmacy to be a part of this  patients care.  Lacinda Moats, PharmD Clinical Pharmacist  1/19/20267:33 AM    [1]  Allergies Allergen Reactions   Gabapentin  Other (See Comments)    Made her crazy

## 2024-01-20 NOTE — Telephone Encounter (Signed)
 Pharmacy Patient Advocate Encounter  Insurance verification completed.    The patient is insured through [employer]. Patient has [employer], may use a copay card, and/or apply for patient assistance if available.    Ran test claim for Eliquis  5mg  tablet and the current 30 day co-pay is $30.   This test claim was processed through [employer]- copay amounts may vary at other pharmacies due to [employer], or as the patient moves through the different stages of their insurance plan.

## 2024-01-20 NOTE — Discharge Instructions (Signed)
 Information on my medicine - ELIQUIS  (apixaban )  This medication education was reviewed with me or my healthcare representative as part of my discharge preparation.  The pharmacist that spoke with me during my hospital stay was:  Lacinda Moats, [facility]  Why was Eliquis  prescribed for you? Eliquis  was prescribed to treat blood clots that may have been found in the veins of your legs (deep vein thrombosis) or in your lungs (pulmonary embolism) and to reduce the risk of them occurring again.  What do You need to know about Eliquis  ? The starting dose is 10 mg (two 5 mg tablets) taken TWICE daily for the FIRST SEVEN (7) DAYS, then on 1/26  the dose is reduced to ONE 5 mg tablet taken TWICE daily.  Eliquis  may be taken with or without food.   Try to take the dose about the same time in the morning and in the evening. If you have difficulty swallowing the tablet whole please discuss with your pharmacist how to take the medication safely.  Take Eliquis  exactly as prescribed and DO NOT stop taking Eliquis  without talking to the doctor who prescribed the medication.  Stopping may increase your risk of developing a new blood clot.  Refill your prescription before you run out.  After discharge, you should have regular check-up appointments with your healthcare provider that is prescribing your Eliquis .    What do you do if you miss a dose? If a dose of ELIQUIS  is not taken at the scheduled time, take it as soon as possible on the same day and twice-daily administration should be resumed. The dose should not be doubled to make up for a missed dose.  Important Safety Information A possible side effect of Eliquis  is bleeding. You should call your healthcare provider right away if you experience any of the following: Bleeding from an injury or your nose that does not stop. Unusual colored urine (red or dark brown) or unusual colored stools (red or black). Unusual bruising for unknown reasons. A  serious fall or if you hit your head (even if there is no bleeding).  Some medicines may interact with Eliquis  and might increase your risk of bleeding or clotting while on Eliquis . To help avoid this, consult your healthcare provider or pharmacist prior to using any new prescription or non-prescription medications, including herbals, vitamins, non-steroidal anti-inflammatory drugs (NSAIDs) and supplements.  This website has more information on Eliquis  (apixaban ): [email or web address]    Follow with Daryl Setter, NP in 5-7 days  Consider hematology referral as an outpatient  Please get a complete blood count and chemistry panel checked by your Primary MD at your next visit, and again as instructed by your Primary MD. Please get your medications reviewed and adjusted by your Primary MD.  Please request your Primary MD to go over all Hospital Tests and Procedure/Radiological results at the follow up, please get all Hospital records sent to your Prim MD by signing hospital release before you go home.  In some cases, there will be blood work, cultures and biopsy results pending at the time of your discharge. Please request that your primary care M.D. goes through all the records of your hospital data and follows up on these results.  If you had Pneumonia of Lung problems at the Hospital: Please get a 2 view Chest X ray done in 6-8 weeks after hospital discharge or sooner if instructed by your Primary MD.  If you have Congestive Heart Failure: Please call your Cardiologist or Primary MD  anytime you have any of the following symptoms:  1) 3 pound weight gain in 24 hours or 5 pounds in 1 week  2) shortness of breath, with or without a dry hacking cough  3) swelling in the hands, feet or stomach  4) if you have to sleep on extra pillows at night in order to breathe  Follow cardiac low salt diet and 1.5 lit/day fluid restriction.  If you have diabetes Accuchecks 4  times/day, Once in AM empty stomach and then before each meal. Log in all results and show them to your primary doctor at your next visit. If any glucose reading is under 80 or above 300 call your primary MD immediately.  If you have Seizure/Convulsions/Epilepsy: Please do not drive, operate heavy machinery, participate in activities at heights or participate in high speed sports until you have seen by Primary MD or a Neurologist and advised to do so again. Per [place] statutes, patients with seizures are not allowed to drive until they have been seizure-free for six months.  Use caution when using heavy equipment or power tools. Avoid working on ladders or at heights. Take showers instead of baths. Ensure the water temperature is not too high on the home water heater. Do not go swimming alone. Do not lock yourself in a room alone (i.e. bathroom). When caring for infants or small children, sit down when holding, feeding, or changing them to minimize risk of injury to the child in the event you have a seizure. Maintain good sleep hygiene. Avoid alcohol.   If you had Gastrointestinal Bleeding: Please ask your Primary MD to check a complete blood count within one week of discharge or at your next visit. Your endoscopic/colonoscopic biopsies that are pending at the time of discharge, will also need to followed by your Primary MD.  Get Medicines reviewed and adjusted. Please take all your medications with you for your next visit with your Primary MD  Please request your Primary MD to go over all hospital tests and procedure/radiological results at the follow up, please ask your Primary MD to get all Hospital records sent to his/her office.  If you experience worsening of your admission symptoms, develop shortness of breath, life threatening emergency, suicidal or homicidal thoughts you must seek medical attention immediately by calling 911 or calling your MD immediately  if symptoms less  severe.  You must read complete instructions/literature along with all the possible adverse reactions/side effects for all the Medicines you take and that have been prescribed to you. Take any new Medicines after you have completely understood and accpet all the possible adverse reactions/side effects.   Do not drive or operate heavy machinery when taking Pain medications.   Do not take more than prescribed Pain, Sleep and Anxiety Medications  Special Instructions: If you have smoked or chewed Tobacco  in the last 2 yrs please stop smoking, stop any regular Alcohol  and or any Recreational drug use.  Wear Seat belts while driving.  Please note You were cared for by a hospitalist during your hospital stay. If you have any questions about your discharge medications or the care you received while you were in the hospital after you are discharged, you can call the unit and asked to speak with the hospitalist on call if the hospitalist that took care of you is not available. Once you are discharged, your primary care physician will handle any further medical issues. Please note that NO REFILLS for any discharge medications  will be authorized once you are discharged, as it is imperative that you return to your primary care physician (or establish a relationship with a primary care physician if you do not have one) for your aftercare needs so that they can reassess your need for medications and monitor your lab values.  You can reach the hospitalist office at phone [phone] or fax [phone]   If you do not have a primary care physician, you can call [phone] for a physician referral.  Activity: As tolerated with Full fall precautions use walker/cane & assistance as needed    Diet: regular  Disposition Home

## 2024-01-20 NOTE — Progress Notes (Signed)
" °   01/20/24 1139  TOC Brief Assessment  Insurance and Status Reviewed  Patient has primary care physician Yes  Home environment has been reviewed home alone  Prior level of function: independent  Prior/Current Home Services No current home services  Social Drivers of Health Review SDOH reviewed interventions complete  Readmission risk has been reviewed Yes  Transition of care needs no transition of care needs at this time    "

## 2024-01-20 NOTE — Progress Notes (Signed)
 Mobility Specialist - Progress Note  (RA) Pre-mobility: 96% SpO2 During mobility: 95-97% SpO2 Post-mobility: 96% SPO2   01/20/24 1018  Mobility  Activity Ambulated with assistance  Level of Assistance Modified independent, requires aide device or extra time  Assistive Device Front wheel walker  Distance Ambulated (ft) 160 ft  Range of Motion/Exercises Active  Activity Response Tolerated well  Mobility visit 1 Mobility  Mobility Specialist Start Time (ACUTE ONLY) 1000  Mobility Specialist Stop Time (ACUTE ONLY) 1018  Mobility Specialist Time Calculation (min) (ACUTE ONLY) 18 min   Pt was found in bed and agreeable to mobilize. C/o some difficulty with breathing during ambulation. Had x1 brief standing rest break due to fatigue. At EOS returned to sit EOB with all needs met. Call bell in reach.   Erminio Leos,  Mobility Specialist Can be reached via Secure Chat

## 2024-01-20 NOTE — Progress Notes (Signed)
 " PROGRESS NOTE  Amber [name] FMW:[id number] DOB: November 27, 1960 DOA: 01/17/2024 PCP: Daryl Setter, NP   LOS: 2 days   Brief Narrative / Interim history: 64 year old female with history of CAD, DM 2, chronic diastolic CHF, HTN, HLD, RA, OSA on CPAP who comes into the hospital with shortness of breath and pleuritic chest pain for most part of the last week.  In the ED she was found to have a large PE with evidence of right heart strain and she was admitted to the hospital  Subjective / 24h Interval events: Continues to have significant chest pain with walking. Was able to walk in the hallway, felt quite lightheaded upon returning to the room   Assesement and Plan: Principal problem Acute PE, chest pain -patient presented with 5 days of progressive shortness of breath, pleuritic chest pain.  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  Troponin is normal, given evidence of right heart strain on the CT scan we obtained a 2D echo cardiogram which was farily unremarkable - lower extremity Dopplers negative for DVT - continue to monitor given PE size, lightheadedness. Switch to Eliquis  today.   Active problems Acute hypoxic respiratory failure -patient found to be tachypneic, shallow breathing, splinting due to pain and hypoxemic requiring 2 L.  This is due to #1.  Weaned off to room air this morning  Essential hypertension-blood pressure stable today, continue Coreg , diltiazem   Hyperlipidemia-continue statin  DM2-well-controlled, A1c 5.6 few weeks ago.  IDA-hemoglobin stable, continue iron   CAD-no ACS type chest pain, continue aspirin , statin  OSA-continue CPAP  Obesity, class I-BMI 32, she would benefit from weight loss  Scheduled Meds:  aspirin  EC  81 mg Oral Q1200   calcium  carbonate  1 tablet Oral Daily   And   cholecalciferol   200 Units Oral Daily   carvedilol   12.5 mg Oral BID WC   diltiazem   240 mg Oral Daily   ferrous  sulfate  325 mg Oral BID   latanoprost   1 drop Both Eyes QHS   rosuvastatin   10 mg Oral Daily   triamterene -hydrochlorothiazide   1 tablet Oral Daily   Continuous Infusions:  heparin  1,050 Units/hr (01/19/24 1558)   PRN Meds:.acetaminophen  **OR** acetaminophen , bisacodyl , HYDROmorphone  (DILAUDID ) injection, lip balm, ondansetron  **OR** ondansetron  (ZOFRAN ) IV, oxyCODONE , senna-docusate  Current Outpatient Medications  Medication Instructions   albuterol  (VENTOLIN  HFA) 108 (90 Base) MCG/ACT inhaler 2 puffs, Inhalation, Every 6 hours PRN   Aspirin  81 mg, Oral, Daily   brimonidine-timolol (COMBIGAN) 0.2-0.5 % ophthalmic solution 1 drop, Both Eyes, Every 12 hours   calcium -vitamin D  (OSCAL WITH D) 500-200 MG-UNIT TABS tablet 1 tablet, Oral, Daily   carvedilol  (COREG ) 12.5 mg, Oral, 2 times daily with meals   cyanocobalamin  (VITAMIN B12) 1000 MCG/ML injection 1000mcg IM weekly x 4 weeks then once monthly   diltiazem  (CARTIA  XT) 240 mg, Oral, Daily   gabapentin  (NEURONTIN ) 100 mg, Oral, Daily at bedtime   hydrochlorothiazide  (HYDRODIURIL ) 25 mg, Oral, Daily   Iron  (Ferrous Sulfate ) 325 mg, Oral, 2 times daily   meloxicam  (MOBIC ) 7.5 mg, Oral, Daily   nystatin  (MYCOSTATIN /NYSTOP ) powder 1 Application, Topical, 3 times daily   ORENCIA  CLICKJECT 125 MG/ML SOAJ INJECT 125 MG UNDER THE SKIN EVERY 7 DAYS   polyethylene glycol (MIRALAX  / GLYCOLAX ) 17 g, Oral, 2 times daily, Until stooling regularly. Then once daily or prn   predniSONE  (DELTASONE ) 10 MG tablet 12 day tapering dose   rosuvastatin  (CRESTOR ) 10 mg, Oral, Daily  SUMAtriptan  (IMITREX ) 100 mg, Oral, As needed, May repeat in 2 hours if headache persists or recurs.   Syringe/Needle, Disp, (SYRINGE 3CC/22GX1-1/2) 22G X 1-1/2 3 ML MISC Use as directed   tirzepatide  (MOUNJARO ) 7.5 mg, Subcutaneous, Weekly   TRAVATAN Z 0.004 % SOLN ophthalmic solution 1 drop, Both Eyes, Daily at bedtime   triamterene -hydrochlorothiazide  (MAXZIDE -25) 37.5-25  MG tablet 1 tablet, Oral, Daily   valsartan  (DIOVAN ) 320 mg, Oral, Daily   Vitamin D  (Ergocalciferol ) (DRISDOL ) 50,000 Units, Oral, Every 7 days    Diet Orders (From admission, onward)     Start     Ordered   01/18/24 0755  Diet regular Room service appropriate? Yes; Fluid consistency: Thin  Diet effective now       Question Answer Comment  Room service appropriate? Yes   Fluid consistency: Thin      01/18/24 0754            DVT prophylaxis:    Lab Results  Component Value Date   PLT 183 01/20/2024      Code Status: Full Code  Family Communication: No family at bedside  Status is: Inpatient  Level of care: Progressive  Consultants:  none  Objective: Vitals:   01/19/24 1946 01/19/24 2253 01/20/24 0428 01/20/24 0836  BP: 107/66  112/75 123/73  Pulse: 74  77 77  Resp: 18 (!) 22 19 15   Temp: 99.1 F (37.3 C)  97.9 F (36.6 C)   TempSrc: Oral  Oral   SpO2: 99%  100% 98%  Weight:      Height:        Intake/Output Summary (Last 24 hours) at 01/20/2024 1141 Last data filed at 01/20/2024 0400 Gross per 24 hour  Intake 239.03 ml  Output --  Net 239.03 ml   Wt Readings from Last 3 Encounters:  01/17/24 75.8 kg  12/24/23 75.3 kg  12/20/23 78 kg    Examination:  Constitutional: NAD Eyes: lids and conjunctivae normal, no scleral icterus ENMT: mmm Neck: normal, supple Respiratory: clear to auscultation bilaterally, no wheezing, no crackles. Normal respiratory effort.  Cardiovascular: Regular rate and rhythm, no murmurs / rubs / gallops. No LE edema. Abdomen: soft, no distention, no tenderness. Bowel sounds positive.    Data Reviewed: I have independently reviewed following labs and imaging studies   CBC Recent Labs  Lab 01/17/24 1700 01/18/24 0259 01/19/24 0522 01/20/24 0432  WBC 6.8 4.6 4.1 4.1  HGB 12.7 12.3 12.2 12.3  HCT 39.6 38.0 38.6 38.7  PLT 171 172 183 183  MCV 87.2 87.8 88.7 88.2  MCH 28.0 28.4 28.0 28.0  MCHC 32.1 32.4 31.6 31.8   RDW 14.6 14.6 14.6 14.4    Recent Labs  Lab 01/17/24 1700 01/17/24 1846 01/19/24 0522 01/20/24 0432  NA 135  --  136 134*  K 3.7  --  4.0 4.1  CL 102  --  103 101  CO2 23  --  22 24  GLUCOSE 86  --  93 103*  BUN 14  --  12 11  CREATININE 0.74  --  0.81 0.68  CALCIUM  9.6  --  9.9 9.9  AST  --   --  21  --   ALT  --   --  15  --   ALKPHOS  --   --  63  --   BILITOT  --   --  0.5  --   ALBUMIN  --   --  3.6  --  MG  --   --   --  1.9  DDIMER  --  3.61*  --   --     ------------------------------------------------------------------------------------------------------------------ No results for input(s): CHOL, HDL, LDLCALC, TRIG, CHOLHDL, LDLDIRECT in the last 72 hours.  Lab Results  Component Value Date   HGBA1C 5.6 12/24/2023   ------------------------------------------------------------------------------------------------------------------ No results for input(s): TSH, T4TOTAL, T3FREE, THYROIDAB in the last 72 hours.  Invalid input(s): FREET3  Cardiac Enzymes No results for input(s): CKMB, TROPONINI, MYOGLOBIN in the last 168 hours.  Invalid input(s): CK ------------------------------------------------------------------------------------------------------------------ No results found for: BNP  CBG: No results for input(s): GLUCAP in the last 168 hours.  Recent Results (from the past 240 hours)  Resp panel by RT-PCR (RSV, Flu A&B, Covid) Anterior Nasal Swab     Status: None   Collection Time: 01/17/24  5:02 PM   Specimen: Anterior Nasal Swab  Result Value Ref Range Status   SARS Coronavirus 2 by RT PCR NEGATIVE NEGATIVE Final    Comment: (NOTE) SARS-CoV-2 target nucleic acids are NOT DETECTED.  The SARS-CoV-2 RNA is generally detectable in upper respiratory specimens during the acute phase of infection. The lowest concentration of SARS-CoV-2 viral copies this assay can detect is 138 copies/mL. A negative result does not  preclude SARS-Cov-2 infection and should not be used as the sole basis for treatment or other patient management decisions. A negative result may occur with  improper specimen collection/handling, submission of specimen other than nasopharyngeal swab, presence of viral mutation(s) within the areas targeted by this assay, and inadequate number of viral copies(<138 copies/mL). A negative result must be combined with clinical observations, patient history, and epidemiological information. The expected result is Negative.  Fact Sheet for Patients:  bloggercourse.com  Fact Sheet for Healthcare Providers:  seriousbroker.it  This test is no t yet approved or cleared by the [place]  FDA and  has been authorized for detection and/or diagnosis of SARS-CoV-2 by FDA under an Emergency Use Authorization (EUA). This EUA will remain  in effect (meaning this test can be used) for the duration of the COVID-19 declaration under Section 564(b)(1) of the Act, 21 U.S.C.section 360bbb-3(b)(1), unless the authorization is terminated  or revoked sooner.       Influenza A by PCR NEGATIVE NEGATIVE Final   Influenza B by PCR NEGATIVE NEGATIVE Final    Comment: (NOTE) The Xpert Xpress SARS-CoV-2/FLU/RSV plus assay is intended as an aid in the diagnosis of influenza from Nasopharyngeal swab specimens and should not be used as a sole basis for treatment. Nasal washings and aspirates are unacceptable for Xpert Xpress SARS-CoV-2/FLU/RSV testing.  Fact Sheet for Patients: bloggercourse.com  Fact Sheet for Healthcare Providers: seriousbroker.it  This test is not yet approved or cleared by the [place]  FDA and has been authorized for detection and/or diagnosis of SARS-CoV-2 by FDA under an Emergency Use Authorization (EUA). This EUA will remain in effect (meaning this test can be used) for the  duration of the COVID-19 declaration under Section 564(b)(1) of the Act, 21 U.S.C. section 360bbb-3(b)(1), unless the authorization is terminated or revoked.     Resp Syncytial Virus by PCR NEGATIVE NEGATIVE Final    Comment: (NOTE) Fact Sheet for Patients: bloggercourse.com  Fact Sheet for Healthcare Providers: seriousbroker.it  This test is not yet approved or cleared by the [place]  FDA and has been authorized for detection and/or diagnosis of SARS-CoV-2 by FDA under an Emergency Use Authorization (EUA). This EUA will remain in effect (meaning this test can be  used) for the duration of the COVID-19 declaration under Section 564(b)(1) of the Act, 21 U.S.C. section 360bbb-3(b)(1), unless the authorization is terminated or revoked.  Performed at [facility], [address] [place], [place] [address]      Radiology Studies: VAS US  LOWER EXTREMITY VENOUS (DVT) Result Date: 01/19/2024  Lower Venous DVT Study Patient Name:  Amber [name]  Date of Exam:   01/19/2024 Medical Rec #: [id number]            Accession #:    [id number] Date of Birth: January 13, 1960            Patient Gender: F Patient Age:   55 years Exam Location:  [facility] Procedure:      VAS US  LOWER EXTREMITY VENOUS (DVT) Referring Phys: NILDA FENDT --------------------------------------------------------------------------------  Indications: Swelling, and Edema.  Comparison Study: Previous study of the right lower extremity on 11.25.2025. Performing Technologist: Edilia Elden Appl  Examination Guidelines: A complete evaluation includes B-mode imaging, spectral Doppler, color Doppler, and power Doppler as needed of all accessible portions of each vessel. Bilateral testing is considered an integral part of a complete examination. Limited examinations for reoccurring indications may be performed as noted. The reflux portion of the exam is performed  with the patient in reverse Trendelenburg.  +---------+---------------+---------+-----------+----------+--------------+ RIGHT    CompressibilityPhasicitySpontaneityPropertiesThrombus Aging +---------+---------------+---------+-----------+----------+--------------+ CFV      Full           Yes      Yes                                 +---------+---------------+---------+-----------+----------+--------------+ SFJ      Full           Yes      Yes                                 +---------+---------------+---------+-----------+----------+--------------+ FV Prox  Full                                                        +---------+---------------+---------+-----------+----------+--------------+ FV Mid   Full                                                        +---------+---------------+---------+-----------+----------+--------------+ FV DistalFull                                                        +---------+---------------+---------+-----------+----------+--------------+ PFV      Full                                                        +---------+---------------+---------+-----------+----------+--------------+ POP      Full  Yes      Yes                                 +---------+---------------+---------+-----------+----------+--------------+ PTV      Full                                                        +---------+---------------+---------+-----------+----------+--------------+ PERO     Full                                                        +---------+---------------+---------+-----------+----------+--------------+   +---------+---------------+---------+-----------+----------+--------------+ LEFT     CompressibilityPhasicitySpontaneityPropertiesThrombus Aging +---------+---------------+---------+-----------+----------+--------------+ CFV      Full           Yes      Yes                                  +---------+---------------+---------+-----------+----------+--------------+ SFJ      Full           Yes      Yes                                 +---------+---------------+---------+-----------+----------+--------------+ FV Prox  Full                                                        +---------+---------------+---------+-----------+----------+--------------+ FV Mid   Full                                                        +---------+---------------+---------+-----------+----------+--------------+ FV DistalFull                                                        +---------+---------------+---------+-----------+----------+--------------+ PFV      Full                                                        +---------+---------------+---------+-----------+----------+--------------+ POP      Full           Yes      Yes                                 +---------+---------------+---------+-----------+----------+--------------+ PTV      Full                                                        +---------+---------------+---------+-----------+----------+--------------+  PERO     Full                                                        +---------+---------------+---------+-----------+----------+--------------+     Summary: BILATERAL: - No evidence of deep vein thrombosis seen in the lower extremities, bilaterally. -No evidence of popliteal cyst, bilaterally.   *See table(s) above for measurements and observations. Electronically signed by Gaile New MD on 01/19/2024 at 5:00:24 PM.    Final      Nilda Fendt, MD, PhD [facility]  Between 7 am - 7 pm I am available, please contact me via Amion (for emergencies) or Securechat (non urgent messages)  Between 7 pm - 7 am I am not available, please contact night coverage MD/APP via Amion  "

## 2024-01-21 ENCOUNTER — Other Ambulatory Visit: Payer: Self-pay

## 2024-01-21 ENCOUNTER — Other Ambulatory Visit (HOSPITAL_COMMUNITY): Payer: Self-pay

## 2024-01-21 LAB — CBC
HCT: 39 % (ref 36.0–46.0)
Hemoglobin: 12.5 g/dL (ref 12.0–15.0)
MCH: 28 pg (ref 26.0–34.0)
MCHC: 32.1 g/dL (ref 30.0–36.0)
MCV: 87.2 fL (ref 80.0–100.0)
Platelets: 203 K/uL (ref 150–400)
RBC: 4.47 MIL/uL (ref 3.87–5.11)
RDW: 14.4 % (ref 11.5–15.5)
WBC: 4.2 K/uL (ref 4.0–10.5)
nRBC: 0 % (ref 0.0–0.2)

## 2024-01-21 MED ORDER — OXYCODONE HCL 5 MG PO TABS
5.0000 mg | ORAL_TABLET | Freq: Four times a day (QID) | ORAL | 0 refills | Status: AC | PRN
  Filled 2024-01-21: qty 12, 3d supply, fill #0

## 2024-01-21 MED ORDER — APIXABAN (ELIQUIS) VTE STARTER PACK (10MG AND 5MG)
ORAL_TABLET | ORAL | 0 refills | Status: DC
  Filled 2024-01-21: qty 74, 30d supply, fill #0

## 2024-01-21 NOTE — Discharge Summary (Signed)
 "  Physician Discharge Summary  Amber [name] FMW:[id number] DOB: July 25, 1960 DOA: 01/17/2024  PCP: Daryl Setter, NP  Admit date: 01/17/2024 Discharge date: 01/21/2024  Admitted From: home Disposition:  home  Recommendations for Outpatient Follow-up:  Follow up with PCP in 1-2 weeks Please obtain BMP/CBC in one week  Home Health: none Equipment/Devices: none  Discharge Condition: stable CODE STATUS: Full code Diet Orders (From admission, onward)     Start     Ordered   01/18/24 0755  Diet regular Room service appropriate? Yes; Fluid consistency: Thin  Diet effective now       Question Answer Comment  Room service appropriate? Yes   Fluid consistency: Thin      01/18/24 0754           Brief Narrative / Interim history: 64 year old female with history of CAD, DM 2, chronic diastolic CHF, HTN, HLD, RA, OSA on CPAP who comes into the hospital with shortness of breath and pleuritic chest pain for most part of the last week.  In the ED she was found to have a large PE with evidence of right heart strain and she was admitted to the hospital  Hospital Course / Discharge diagnoses: Principal problem Acute PE, chest pain -patient presented with 5 days of progressive shortness of breath, pleuritic chest pain.  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  Troponin is normal, given evidence of right heart strain on the CT scan we obtained a 2D echo cardiogram which showed LVEF of 65 to 65%, no wall motion abnormalities, normal diastolic parameters.  RV systolic function and ventricular size were normal.  There was no increased pulmonary arterial systolic pressure.  Lower extremity Dopplers were negative for DVT.  She has improved, pain is subsiding, able to ambulate in the hallway on room air without difficulties, she was switched to Eliquis  and discharged home in stable condition.  She was advised to follow-up with PCP, she is to continue  anticoagulation for at least 3 to 6 months and would probably benefit from a hematology referral as an outpatient since her PE was unprovoked.  Active problems Acute hypoxic respiratory failure -patient found to be tachypneic, shallow breathing, splinting due to pain and hypoxemic requiring 2 L.  This is due to #1.  She was weaned off to room air  Essential hypertension-blood pressure stable today, continue home medications except valsartan , given that she is normotensive here Hyperlipidemia-continue statin DM2-well-controlled, A1c 5.6 few weeks ago. 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, she would benefit from weight loss  Sepsis ruled out   Discharge Instructions   Allergies as of 01/21/2024       Reactions   Gabapentin  Other (See Comments)   Made her crazy        Medication List     STOP taking these medications    hydrochlorothiazide  25 MG tablet Commonly known as: HYDRODIURIL    meloxicam  7.5 MG tablet Commonly known as: MOBIC    predniSONE  10 MG tablet Commonly known as: DELTASONE    valsartan  320 MG tablet Commonly known as: DIOVAN        TAKE these medications    albuterol  108 (90 Base) MCG/ACT inhaler Commonly known as: VENTOLIN  HFA Inhale 2 puffs into the lungs every 6 (six) hours as needed. What changed: reasons to take this   Apixaban  Starter Pack (10mg  and 5mg ) Commonly known as: ELIQUIS  STARTER PACK Take as directed on  package: start with two-5mg  tablets twice daily for 7 days. On day 8, switch to one-5mg  tablet twice daily.   Aspirin  81 MG Caps Take 81 mg by mouth daily at 12 noon.   brimonidine-timolol 0.2-0.5 % ophthalmic solution Commonly known as: COMBIGAN Place 1 drop into both eyes every 12 (twelve) hours.   calcium -vitamin D  500-200 MG-UNIT Tabs tablet Commonly known as: OSCAL WITH D Take 1 tablet by mouth daily.   carvedilol  12.5 MG  tablet Commonly known as: COREG  Take 1 tablet (12.5 mg total) by mouth 2 (two) times daily with a meal.   cyanocobalamin  1000 MCG/ML injection Commonly known as: VITAMIN B12 1000mcg IM weekly x 4 weeks then once monthly   diltiazem  240 MG 24 hr capsule Commonly known as: Cartia  XT Take 1 capsule (240 mg total) by mouth daily.   gabapentin  100 MG capsule Commonly known as: NEURONTIN  Take 1 capsule (100 mg total) by mouth at bedtime.   Iron  (Ferrous Sulfate ) 325 (65 Fe) MG Tabs Take 325 mg by mouth 2 (two) times daily.   nystatin  powder Commonly known as: MYCOSTATIN /NYSTOP  Apply 1 Application topically 3 (three) times daily. What changed: when to take this   Orencia  ClickJect 125 MG/ML Soaj Generic drug: Abatacept  INJECT 125 MG UNDER THE SKIN EVERY 7 DAYS What changed: See the new instructions.   oxyCODONE  5 MG immediate release tablet Commonly known as: Oxy IR/ROXICODONE  Take 1 tablet (5 mg total) by mouth every 6 (six) hours as needed for moderate pain (pain score 4-6) or severe pain (pain score 7-10).   polyethylene glycol 17 g packet Commonly known as: MIRALAX  / GLYCOLAX  Take 17 g by mouth 2 (two) times daily. Until stooling regularly. Then once daily or prn What changed:  when to take this reasons to take this additional instructions   rosuvastatin  10 MG tablet Commonly known as: CRESTOR  Take 1 tablet (10 mg total) by mouth daily.   SUMAtriptan  100 MG tablet Commonly known as: Imitrex  Take 1 tablet (100 mg total) by mouth as needed for migraine. May repeat in 2 hours if headache persists or recurs.   SYRINGE 3CC/22GX1-1/2 22G X 1-1/2 3 ML Misc Use as directed   tirzepatide  7.5 MG/0.5ML Pen Commonly known as: MOUNJARO  Inject 7.5 mg into the skin once a week.   Travatan Z 0.004 % Soln ophthalmic solution Generic drug: Travoprost (BAK Free) Place 1 drop into both eyes at bedtime.   triamterene -hydrochlorothiazide  37.5-25 MG tablet Commonly known as:  MAXZIDE -25 Take 1 tablet by mouth daily.   Vitamin D  (Ergocalciferol ) 1.25 MG (50000 UNIT) Caps capsule Commonly known as: DRISDOL  Take 1 capsule (50,000 Units total) by mouth every 7 (seven) days.       Consultations: none  Procedures/Studies:  VAS US  LOWER EXTREMITY VENOUS (DVT) Result Date: 01/19/2024  Lower Venous DVT Study Patient Name:  MANJU KULKARNI  Date of Exam:   01/19/2024 Medical Rec #: [id number]            Accession #:    [id number] Date of Birth: [birth year]            Patient Gender: F Patient Age:   38 years Exam Location[facility]  [facility] Procedure:      VAS US  LOWER EXTREMITY VENOUS (DVT) Referring Phys: NILDA FENDT --------------------------------------------------------------------------------  Indications: Swelling, and Edema.  Comparison Study: Previous study of the right lower extremity on 11.25.2025. Performing Technologist: Edilia Elden Appl  Examination Guidelines: A complete evaluation includes B-mode imaging, spectral Doppler, color  Doppler, and power Doppler as needed of all accessible portions of each vessel. Bilateral testing is considered an integral part of a complete examination. Limited examinations for reoccurring indications may be performed as noted. The reflux portion of the exam is performed with the patient in reverse Trendelenburg.  +---------+---------------+---------+-----------+----------+--------------+ RIGHT    CompressibilityPhasicitySpontaneityPropertiesThrombus Aging +---------+---------------+---------+-----------+----------+--------------+ CFV      Full           Yes      Yes                                 +---------+---------------+---------+-----------+----------+--------------+ SFJ      Full           Yes      Yes                                 +---------+---------------+---------+-----------+----------+--------------+ FV Prox  Full                                                         +---------+---------------+---------+-----------+----------+--------------+ FV Mid   Full                                                        +---------+---------------+---------+-----------+----------+--------------+ FV DistalFull                                                        +---------+---------------+---------+-----------+----------+--------------+ PFV      Full                                                        +---------+---------------+---------+-----------+----------+--------------+ POP      Full           Yes      Yes                                 +---------+---------------+---------+-----------+----------+--------------+ PTV      Full                                                        +---------+---------------+---------+-----------+----------+--------------+ PERO     Full                                                        +---------+---------------+---------+-----------+----------+--------------+   +---------+---------------+---------+-----------+----------+--------------+ LEFT  CompressibilityPhasicitySpontaneityPropertiesThrombus Aging +---------+---------------+---------+-----------+----------+--------------+ CFV      Full           Yes      Yes                                 +---------+---------------+---------+-----------+----------+--------------+ SFJ      Full           Yes      Yes                                 +---------+---------------+---------+-----------+----------+--------------+ FV Prox  Full                                                        +---------+---------------+---------+-----------+----------+--------------+ FV Mid   Full                                                        +---------+---------------+---------+-----------+----------+--------------+ FV DistalFull                                                         +---------+---------------+---------+-----------+----------+--------------+ PFV      Full                                                        +---------+---------------+---------+-----------+----------+--------------+ POP      Full           Yes      Yes                                 +---------+---------------+---------+-----------+----------+--------------+ PTV      Full                                                        +---------+---------------+---------+-----------+----------+--------------+ PERO     Full                                                        +---------+---------------+---------+-----------+----------+--------------+     Summary: BILATERAL: - No evidence of deep vein thrombosis seen in the lower extremities, bilaterally. -No evidence of popliteal cyst, bilaterally.   *See table(s) above for measurements and observations. Electronically signed by Gaile New MD on 01/19/2024 at 5:00:24 PM.    Final    ECHOCARDIOGRAM COMPLETE Result Date: 01/19/2024  ECHOCARDIOGRAM REPORT   Patient Name:   DETRIA CUMMINGS Date of Exam: 01/19/2024 Medical Rec #:  [id number]           Height:       60.0 in Accession #:    [id number]          Weight:       167.0 lb Date of Birth:  28-Jun-1960           BSA:          1.729 m Patient Age:    63 years            BP:           105/70 mmHg Patient Gender: F                   HR:           72 bpm. Exam Location:  Inpatient Procedure: 2D Echo, Color Doppler and Cardiac Doppler (Both Spectral and Color            Flow Doppler were utilized during procedure). Indications:    Pulmonary Embolus I26.09  History:        Patient has prior history of Echocardiogram examinations, most                 recent 03/29/2020. Risk Factors:Hypertension.  Sonographer:    Nathanel Devonshire Referring Phys: [id number] PROSPER M AMPONSAH IMPRESSIONS  1. Left ventricular ejection fraction, by estimation, is 60 to 65%. The left ventricle has normal function. The  left ventricle has no regional wall motion abnormalities. Left ventricular diastolic parameters were normal.  2. Right ventricular systolic function was not well visualized. The right ventricular size is normal. There is normal pulmonary artery systolic pressure.  3. The mitral valve is normal in structure. No evidence of mitral valve regurgitation. No evidence of mitral stenosis.  4. The aortic valve was not well visualized. Aortic valve regurgitation is not visualized. No aortic stenosis is present.  5. The inferior vena cava is normal in size with greater than 50% respiratory variability, suggesting right atrial pressure of 3 mmHg. Comparison(s): No significant change from prior study. FINDINGS  Left Ventricle: Left ventricular ejection fraction, by estimation, is 60 to 65%. The left ventricle has normal function. The left ventricle has no regional wall motion abnormalities. Strain was performed and the global longitudinal strain is indeterminate. The left ventricular internal cavity size was normal in size. There is no left ventricular hypertrophy. Left ventricular diastolic parameters were normal. Right Ventricle: The right ventricular size is normal. No increase in right ventricular wall thickness. Right ventricular systolic function was not well visualized. There is normal pulmonary artery systolic pressure. The tricuspid regurgitant velocity is  2.51 m/s, and with an assumed right atrial pressure of 3 mmHg, the estimated right ventricular systolic pressure is 28.2 mmHg. Left Atrium: Left atrial size was normal in size. Right Atrium: Right atrial size was normal in size. Pericardium: There is no evidence of pericardial effusion. Mitral Valve: The mitral valve is normal in structure. No evidence of mitral valve regurgitation. No evidence of mitral valve stenosis. Tricuspid Valve: The tricuspid valve is normal in structure. Tricuspid valve regurgitation is trivial. No evidence of tricuspid stenosis. Aortic  Valve: The aortic valve was not well visualized. Aortic valve regurgitation is not visualized. No aortic stenosis is present. Pulmonic Valve: The pulmonic valve was not well visualized. Pulmonic valve regurgitation is not visualized. No evidence of pulmonic stenosis. Aorta: The  aortic root and ascending aorta are structurally normal, with no evidence of dilitation. Venous: The inferior vena cava is normal in size with greater than 50% respiratory variability, suggesting right atrial pressure of 3 mmHg. IAS/Shunts: No atrial level shunt detected by color flow Doppler. Additional Comments: 3D was performed not requiring image post processing on an independent workstation and was indeterminate.  LEFT VENTRICLE PLAX 2D LVIDd:         3.90 cm     Diastology LVIDs:         2.50 cm     LV e' medial:    6.53 cm/s LV PW:         1.00 cm     LV E/e' medial:  9.1 LV IVS:        1.00 cm     LV e' lateral:   7.72 cm/s LVOT diam:     1.80 cm     LV E/e' lateral: 7.7 LVOT Area:     2.54 cm LV IVRT:       120 msec  LV Volumes (MOD) LV vol d, MOD A2C: 55.8 ml LV vol d, MOD A4C: 50.0 ml LV vol s, MOD A2C: 22.6 ml LV vol s, MOD A4C: 16.5 ml LV SV MOD A2C:     33.2 ml LV SV MOD A4C:     50.0 ml LV SV MOD BP:      32.3 ml RIGHT VENTRICLE          IVC RV Basal diam:  3.10 cm  IVC diam: 1.50 cm TAPSE (M-mode): 2.1 cm                          PULMONARY VEINS                          Diastolic Velocity: 39.00 cm/s                          S/D Velocity:       1.30                          Systolic Velocity:  50.60 cm/s LEFT ATRIUM             Index        RIGHT ATRIUM           Index LA diam:        3.20 cm 1.85 cm/m   RA Area:     12.40 cm LA Vol (A2C):   31.8 ml 18.39 ml/m  RA Volume:   31.20 ml  18.05 ml/m LA Vol (A4C):   26.4 ml 15.27 ml/m LA Biplane Vol: 29.8 ml 17.24 ml/m                        PULMONIC VALVE AORTA                 PV Vmax:       0.67 m/s Ao Root diam: 2.40 cm PV Peak grad:  1.8 mmHg Ao Asc diam:  3.00 cm  MITRAL  VALVE               TRICUSPID VALVE MV Area (PHT): 4.39 cm    TR Peak grad:   25.2 mmHg MV Decel Time: 173 msec    TR Vmax:  251.00 cm/s MV E velocity: 59.60 cm/s MV A velocity: 69.80 cm/s  SHUNTS MV E/A ratio:  0.85        Systemic Diam: 1.80 cm Vishnu Priya Mallipeddi Electronically signed by Diannah Late Mallipeddi Signature Date/Time: 01/19/2024/11:44:30 AM    Final    CT Angio Chest PE W and/or Wo Contrast Result Date: 01/17/2024 EXAM: CTA CHEST 01/17/2024 07:44:20 PM TECHNIQUE: CTA of the chest was performed after the administration of intravenous contrast. Multiplanar reformatted images are provided for review. MIP images are provided for review. Automated exposure control, iterative reconstruction, and/or weight based adjustment of the mA/kV was utilized to reduce the radiation dose to as low as reasonably achievable. COMPARISON: None available. CLINICAL HISTORY: Pulmonary embolism (PE) suspected, high prob. FINDINGS: PULMONARY ARTERIES: Pulmonary arteries are adequately opacified for evaluation. Pulmonary emboli are seen in the distal right main pulmonary artery extending into lobar and segmental branches of the right middle lobe and right lower lobe. There are also segmental right upper lobe pulmonary emboli. There are subsegmental left lower lobe pulmonary emboli and segmental and subsegmental left upper lobe pulmonary emboli. Main pulmonary artery is normal in caliber. MEDIASTINUM: The heart and pericardium demonstrate no acute abnormality. There is no acute abnormality of the thoracic aorta. LYMPH NODES: No mediastinal, hilar or axillary lymphadenopathy. LUNGS AND PLEURA: There is some mild patchy airspace disease in the bilateral lung bases. There is a trace left pleural effusion. No pneumothorax. UPPER ABDOMEN: Limited images of the upper abdomen are unremarkable. SOFT TISSUES AND BONES: No acute bone or soft tissue abnormality. IMPRESSION: 1. Pulmonary emboli in the distal right main  pulmonary artery extending into lobar and segmental branches of the right middle and right lower lobes, as well as segmental right upper lobe pulmonary emboli, with additional segmental and subsegmental left upper lobe and subsegmental left lower lobe pulmonary emboli. CT evidence of right heart strain (RV/LV Ratio = 1.0) consistent with at least submassive (intermediate risk) PE. The presence of right heart strain has been associated with an increased risk of morbidity and mortality. Please activate Code PE / PE Focused Oder set in [facility] for further guidance. 2. Mild patchy airspace disease in the bilateral lung bases. 3. Trace left pleural effusion. Electronically signed by: Greig Pique MD 01/17/2024 07:53 PM EST RP Workstation: [id number]   DG Chest 2 View Result Date: 01/17/2024 EXAM: 2 VIEW(S) XRAY OF THE CHEST 01/17/2024 05:27:00 PM COMPARISON: None available. CLINICAL HISTORY: shortness of breath FINDINGS: LUNGS AND PLEURA: New minimal left lower lobe airspace disease. No pleural effusion. No pneumothorax. HEART AND MEDIASTINUM: No acute abnormality of the cardiac and mediastinal silhouettes. BONES AND SOFT TISSUES: No acute osseous abnormality. IMPRESSION: 1. New minimal left lower lobe airspace disease. Electronically signed by: Greig Pique MD 01/17/2024 05:51 PM EST RP Workstation: [id number]     Subjective: - no chest pain, shortness of breath, no abdominal pain, nausea or vomiting.   Discharge Exam: BP 115/77   Pulse 80   Temp 99.6 F (37.6 C) (Oral)   Resp 19   Ht 5' (1.524 m)   Wt 75.8 kg   LMP 08/09/2016   SpO2 96%   BMI 32.61 kg/m   General: Pt is alert, awake, not in acute distress Cardiovascular: RRR, S1/S2 +, no rubs, no gallops Respiratory: CTA bilaterally, no wheezing, no rhonchi Abdominal: Soft, NT, ND, bowel sounds + Extremities: no edema, no cyanosis    The results of significant diagnostics from this hospitalization (including imaging, microbiology, ancillary  and  laboratory) are listed below for reference.     Microbiology: Recent Results (from the past 240 hours)  Resp panel by RT-PCR (RSV, Flu A&B, Covid) Anterior Nasal Swab     Status: None   Collection Time: 01/17/24  5:02 PM   Specimen: Anterior Nasal Swab  Result Value Ref Range Status   SARS Coronavirus 2 by RT PCR NEGATIVE NEGATIVE Final    Comment: (NOTE) SARS-CoV-2 target nucleic acids are NOT DETECTED.  The SARS-CoV-2 RNA is generally detectable in upper respiratory specimens during the acute phase of infection. The lowest concentration of SARS-CoV-2 viral copies this assay can detect is 138 copies/mL. A negative result does not preclude SARS-Cov-2 infection and should not be used as the sole basis for treatment or other patient management decisions. A negative result may occur with  improper specimen collection/handling, submission of specimen other than nasopharyngeal swab, presence of viral mutation(s) within the areas targeted by this assay, and inadequate number of viral copies(<138 copies/mL). A negative result must be combined with clinical observations, patient history, and epidemiological information. The expected result is Negative.  Fact Sheet for Patients:  bloggercourse.com  Fact Sheet for Healthcare Providers:  seriousbroker.it  This test is no t yet approved or cleared by the [place]  FDA and  has been authorized for detection and/or diagnosis of SARS-CoV-2 by FDA under an Emergency Use Authorization (EUA). This EUA will remain  in effect (meaning this test can be used) for the duration of the COVID-19 declaration under Section 564(b)(1) of the Act, 21 U.S.C.section 360bbb-3(b)(1), unless the authorization is terminated  or revoked sooner.       Influenza A by PCR NEGATIVE NEGATIVE Final   Influenza B by PCR NEGATIVE NEGATIVE Final    Comment: (NOTE) The Xpert Xpress SARS-CoV-2/FLU/RSV plus assay  is intended as an aid in the diagnosis of influenza from Nasopharyngeal swab specimens and should not be used as a sole basis for treatment. Nasal washings and aspirates are unacceptable for Xpert Xpress SARS-CoV-2/FLU/RSV testing.  Fact Sheet for Patients: bloggercourse.com  Fact Sheet for Healthcare Providers: seriousbroker.it  This test is not yet approved or cleared by the [place]  FDA and has been authorized for detection and/or diagnosis of SARS-CoV-2 by FDA under an Emergency Use Authorization (EUA). This EUA will remain in effect (meaning this test can be used) for the duration of the COVID-19 declaration under Section 564(b)(1) of the Act, 21 U.S.C. section 360bbb-3(b)(1), unless the authorization is terminated or revoked.     Resp Syncytial Virus by PCR NEGATIVE NEGATIVE Final    Comment: (NOTE) Fact Sheet for Patients: bloggercourse.com  Fact Sheet for Healthcare Providers: seriousbroker.it  This test is not yet approved or cleared by the [place]  FDA and has been authorized for detection and/or diagnosis of SARS-CoV-2 by FDA under an Emergency Use Authorization (EUA). This EUA will remain in effect (meaning this test can be used) for the duration of the COVID-19 declaration under Section 564(b)(1) of the Act, 21 U.S.C. section 360bbb-3(b)(1), unless the authorization is terminated or revoked.  Performed at [facility], [address] [place], [place] [address]      Labs: Basic Metabolic Panel: Recent Labs  Lab 01/17/24 1700 01/19/24 0522 01/20/24 0432  NA 135 136 134*  K 3.7 4.0 4.1  CL 102 103 101  CO2 23 22 24   GLUCOSE 86 93 103*  BUN 14 12 11   CREATININE 0.74 0.81 0.68  CALCIUM  9.6 9.9 9.9  MG  --   --  1.9   Liver Function Tests: Recent Labs  Lab 01/19/24 0522  AST 21  ALT 15  ALKPHOS 63  BILITOT 0.5  PROT 7.5   ALBUMIN 3.6   CBC: Recent Labs  Lab 01/17/24 1700 01/18/24 0259 01/19/24 0522 01/20/24 0432 01/21/24 0508  WBC 6.8 4.6 4.1 4.1 4.2  HGB 12.7 12.3 12.2 12.3 12.5  HCT 39.6 38.0 38.6 38.7 39.0  MCV 87.2 87.8 88.7 88.2 87.2  PLT 171 172 183 183 203   CBG: No results for input(s): GLUCAP in the last 168 hours. Hgb A1c No results for input(s): HGBA1C in the last 72 hours. Lipid Profile No results for input(s): CHOL, HDL, LDLCALC, TRIG, CHOLHDL, LDLDIRECT in the last 72 hours. Thyroid  function studies No results for input(s): TSH, T4TOTAL, T3FREE, THYROIDAB in the last 72 hours.  Invalid input(s): FREET3 Urinalysis    Component Value Date/Time   COLORURINE YELLOW 01/17/2024 0005   APPEARANCEUR CLEAR 01/17/2024 0005   LABSPEC <=1.005 01/17/2024 0005   PHURINE 5.5 01/17/2024 0005   GLUCOSEU NEGATIVE 01/17/2024 0005   GLUCOSEU NEGATIVE 05/14/2016 1227   HGBUR NEGATIVE 01/17/2024 0005   BILIRUBINUR NEGATIVE 01/17/2024 0005   KETONESUR NEGATIVE 01/17/2024 0005   PROTEINUR NEGATIVE 01/17/2024 0005   UROBILINOGEN 0.2 05/14/2016 1227   NITRITE NEGATIVE 01/17/2024 0005   LEUKOCYTESUR NEGATIVE 01/17/2024 0005    FURTHER DISCHARGE INSTRUCTIONS:   Get Medicines reviewed and adjusted: Please take all your medications with you for your next visit with your Primary MD   Laboratory/radiological data: Please request your Primary MD to go over all hospital tests and procedure/radiological results at the follow up, please ask your Primary MD to get all Hospital records sent to his/her office.   In some cases, they will be blood work, cultures and biopsy results pending at the time of your discharge. Please request that your primary care M.D. goes through all the records of your hospital data and follows up on these results.   Also Note the following: If you experience worsening of your admission symptoms, develop shortness of breath, life threatening  emergency, suicidal or homicidal thoughts you must seek medical attention immediately by calling 911 or calling your MD immediately  if symptoms less severe.   You must read complete instructions/literature along with all the possible adverse reactions/side effects for all the Medicines you take and that have been prescribed to you. Take any new Medicines after you have completely understood and accpet all the possible adverse reactions/side effects.    Do not drive when taking Pain medications or sleeping medications (Benzodaizepines)   Do not take more than prescribed Pain, Sleep and Anxiety Medications. It is not advisable to combine anxiety,sleep and pain medications without talking with your primary care practitioner   Special Instructions: If you have smoked or chewed Tobacco  in the last 2 yrs please stop smoking, stop any regular Alcohol  and or any Recreational drug use.   Wear Seat belts while driving.   Please note: You were cared for by a hospitalist during your hospital stay. Once you are discharged, your primary care physician will handle any further medical issues. Please note that NO REFILLS for any discharge medications will be authorized once you are discharged, as it is imperative that you return to your primary care physician (or establish a relationship with a primary care physician if you do not have one) for your post hospital discharge needs so that they can reassess your need for medications and monitor your lab  values.  Time coordinating discharge: 35 minutes  SIGNED:  Nilda Fendt, MD, PhD 01/21/2024, 8:18 AM   "

## 2024-01-21 NOTE — Progress Notes (Signed)
 Discharge meds in a secure bag delivered to patient by this RN

## 2024-01-21 NOTE — TOC Transition Note (Signed)
 Transition of Care [facility]) - Discharge Note   Patient Details  Name: Amber [name] MRN: [id number] Date of Birth: [birth year]  [facility] CM/SW Contact:  Bascom Service, RN Phone Number: 01/21/2024, 9:40 AM   Clinical Narrative: d/c home no CM needs.      Final next level of care: Home/Self Care Barriers to Discharge: No Barriers Identified   Patient Goals and CMS Choice            Discharge Placement                       Discharge Plan and Services Additional resources added to the After Visit Summary for                                       Social Drivers of Health (SDOH) Interventions SDOH Screenings   Food Insecurity: Food Insecurity Present (01/18/2024)  Housing: Unknown (01/18/2024)  Transportation Needs: No Transportation Needs (01/18/2024)  Utilities: Not At Risk (01/18/2024)  Depression (PHQ2-9): Low Risk (08/06/2023)  Financial Resource Strain: Medium Risk (11/27/2023)  Physical Activity: Inactive (01/25/2023)  Social Connections: Moderately Integrated (11/27/2023)  Stress: No Stress Concern Present (01/25/2023)  Tobacco Use: Low Risk (01/17/2024)     Readmission Risk Interventions    01/20/2024   11:39 AM  Readmission Risk Prevention Plan  Post Dischage Appt Complete  Medication Screening Complete  Transportation Screening Complete

## 2024-01-22 ENCOUNTER — Telehealth: Payer: Self-pay

## 2024-01-22 NOTE — Transitions of Care (Post Inpatient/ED Visit) (Signed)
" ° °  01/22/2024  Name: Amber [name] MRN: [id number] DOB: 09/17/1960  Today's TOC FU Call Status: Today's TOC FU Call Status:: Unsuccessful Call (1st Attempt) Unsuccessful Call (1st Attempt) Date: 01/22/24  Attempted to reach the patient regarding the most recent Inpatient/ED visit.  Follow Up Plan: Additional outreach attempts will be made to reach the patient to complete the Transitions of Care (Post Inpatient/ED visit) call.   Shona Prow RN, CCM [place]  VBCI-Population Health RN Care Manager [phone]  "

## 2024-01-23 ENCOUNTER — Telehealth: Payer: Self-pay

## 2024-01-23 NOTE — Transitions of Care (Post Inpatient/ED Visit) (Signed)
 "  01/23/2024  Name: Amber [name] MRN: [id number] DOB: [birth year]  Today's TOC FU Call Status: Today's TOC FU Call Status:: Successful TOC FU Call Completed TOC FU Call Complete Date: 01/23/24  Patient's Name and Date of Birth confirmed. Name, DOB  Transition Care Management Follow-up Telephone Call Date of Discharge: 01/21/24 Discharge Facility: [facility]) Type of Discharge: Inpatient Admission Primary Inpatient Discharge Diagnosis:: Acute pulmonary embolism How have you been since you were released from the hospital?: Same Any questions or concerns?: Yes Patient Questions/Concerns:: Back Pain Patient Questions/Concerns Addressed: Other: (Scheduled a hospital F/U for 1/23)  Items Reviewed: Did you receive and understand the discharge instructions provided?: Yes Medications obtained,verified, and reconciled?: Yes (Medications Reviewed) Any new allergies since your discharge?: No Dietary orders reviewed?: Yes Type of Diet Ordered:: Low sodium Heart Healthy Do you have support at home?: Yes People in Home [RPT]: alone Name of Support/Comfort Primary Source: Hobert Shams, sister  Medications Reviewed Today: Medications Reviewed Today     Reviewed by Moises Reusing, RN (Case Manager) on 01/23/24 at 1616  Med List Status: <None>   Medication Order Taking? Sig Documenting Provider Last Dose Status Informant  albuterol  (VENTOLIN  HFA) 108 (90 Base) MCG/ACT inhaler [id number]  Inhale 2 puffs into the lungs every 6 (six) hours as needed.  Patient taking differently: Inhale 2 puffs into the lungs every 6 (six) hours as needed for shortness of breath or wheezing.   Hazen Darryle BRAVO, FNP  Active Self, Pharmacy Records  APIXABAN  (ELIQUIS ) VTE STARTER PACK (10MG  AND 5MG ) [id number]  Take as directed on package: start with two-5mg  tablets twice daily for 7 days. On day 8, switch to one-5mg  tablet twice daily. Gherghe, Costin M, MD  Active   Aspirin  81 MG CAPS [id number]   Take 81 mg by mouth daily at 12 noon. [provider]  Active Self, Pharmacy Records  brimonidine-timolol (COMBIGAN) 0.2-0.5 % ophthalmic solution [id number]  Place 1 drop into both eyes every 12 (twelve) hours. [provider]  Active Self, Pharmacy Records  calcium -vitamin D  (OSCAL WITH D) 500-200 MG-UNIT TABS tablet [id number]  Take 1 tablet by mouth daily. [provider]  Active Self, Pharmacy Records  carvedilol  (COREG ) 12.5 MG tablet [id number]  Take 1 tablet (12.5 mg total) by mouth 2 (two) times daily with a meal. Daryl Setter, NP  Active Self, Pharmacy Records  cyanocobalamin  (VITAMIN B12) 1000 MCG/ML injection [id number]  IM weekly x 4 weeks then once monthly  Patient not taking: Reported on 01/18/2024   [name], [name], NP  Active Self, Pharmacy Records  diltiazem  (CARTIA  XT) 240 MG 24 hr capsule [id number]  Take 1 capsule (240 mg total) by mouth daily. Daryl Setter, NP  Active Self, Pharmacy Records  gabapentin  (NEURONTIN ) 100 MG capsule [id number]  Take 1 capsule (100 mg total) by mouth at bedtime.  Patient not taking: Reported on 12/24/2023   Daryl Setter, NP  Active Self, Pharmacy Records  Iron , Ferrous Sulfate , 325 (65 Fe) MG TABS [id number]  Take 325 mg by mouth 2 (two) times daily. Daryl Setter, NP  Active Self, Pharmacy Records  nystatin  (MYCOSTATIN /NYSTOP ) powder [id number]  Apply 1 Application topically 3 (three) times daily.  Patient taking differently: Apply 1 Application topically 2 (two) times daily.   Daryl Setter, NP  Active Self, Pharmacy Records  ORENCIA  CLICKJECT 125 MG/ML EMMANUEL [id number]  INJECT 125 MG UNDER THE SKIN EVERY 7 DAYS  Patient taking differently: Inject 125 mg into the skin See admin instructions. Every  10 days   Dolphus Reiter, MD  Active Self, Pharmacy Records  oxyCODONE  (OXY IR/ROXICODONE ) 5 MG immediate release tablet [id number] Yes Take 1 tablet (5 mg total) by mouth every 6 (six)  hours as needed for moderate pain (pain score 4-6) or severe pain (pain score 7-10). Gherghe, Costin M, MD  Active   polyethylene glycol (MIRALAX  / GLYCOLAX ) 17 g packet [id number]  Take 17 g by mouth 2 (two) times daily. Until stooling regularly. Then once daily or prn  Patient taking differently: Take 17 g by mouth daily as needed for mild constipation, moderate constipation or severe constipation.   Midge Sober, DO  Active Self, Pharmacy Records  rosuvastatin  (CRESTOR ) 10 MG tablet [id number]  Take 1 tablet (10 mg total) by mouth daily. Daryl Setter, NP  Active Self, Pharmacy Records  SUMAtriptan  (IMITREX ) 100 MG tablet [id number]  Take 1 tablet (100 mg total) by mouth as needed for migraine. May repeat in 2 hours if headache persists or recurs. Daryl Setter, NP  Active Self, Pharmacy Records  Syringe/Needle, Disp, (SYRINGE 3CC/22GX1-1/2) 22G X 1-1/2 3 ML MISC [id number]  Use as directed Daryl Setter, NP  Active Self, Pharmacy Records  tirzepatide  (MOUNJARO ) 7.5 MG/0.5ML Pen [id number]  Inject 7.5 mg into the skin once a week. Jonel Rockie [name], NP  Active Self, Pharmacy Records  TRAVATAN Z 0.004 % SOLN ophthalmic solution [id number]  Place 1 drop into both eyes at bedtime.  [provider]  Active Self, Pharmacy Records  triamterene -hydrochlorothiazide  (MAXZIDE -25) 37.5-25 MG tablet [id number]  Take 1 tablet by mouth daily. [provider]  Active Self, Pharmacy Records  Vitamin D , Ergocalciferol , (DRISDOL ) 1.25 MG (50000 UNIT) CAPS capsule [id number]  Take 1 capsule (50,000 Units total) by mouth every 7 (seven) days. Jonel Rockie [name], NP  Active Self, Pharmacy Records            Home Care and Equipment/Supplies: Were Home Health Services Ordered?: No Any new equipment or medical supplies ordered?: No  Functional Questionnaire: Do you need assistance with bathing/showering or dressing?: No Do you need assistance with meal preparation?: No Do you need  assistance with eating?: No Do you have difficulty maintaining continence: No Do you need assistance with getting out of bed/getting out of a chair/moving?: No Do you have difficulty managing or taking your medications?: No  Follow up appointments reviewed: PCP Follow-up appointment confirmed?: Yes Date of PCP follow-up appointment?: 01/24/24 Follow-up Provider: Setter D[facility] Follow-up appointment confirmed?: NA Do you need transportation to your follow-up appointment?: No Do you understand care options if your condition(s) worsen?: Yes-patient verbalized understanding  SDOH Interventions Today    Flowsheet Row Most Recent Value  SDOH Interventions   Food Insecurity Interventions Community Resources Provided  Housing Interventions Intervention Not Indicated  Transportation Interventions Intervention Not Indicated  Utilities Interventions Intervention Not Indicated    Medford Balboa, BSN, RN [place]  VBCI - Population Health RN Care Manager [phone]  "

## 2024-01-24 ENCOUNTER — Ambulatory Visit: Admitting: Family

## 2024-01-24 VITALS — BP 86/49 | HR 66 | Temp 98.6°F | Resp 16 | Ht 60.0 in | Wt 170.0 lb

## 2024-01-24 DIAGNOSIS — R110 Nausea: Secondary | ICD-10-CM | POA: Diagnosis not present

## 2024-01-24 DIAGNOSIS — Z7985 Long-term (current) use of injectable non-insulin antidiabetic drugs: Secondary | ICD-10-CM | POA: Diagnosis not present

## 2024-01-24 DIAGNOSIS — E1159 Type 2 diabetes mellitus with other circulatory complications: Secondary | ICD-10-CM

## 2024-01-24 DIAGNOSIS — I2699 Other pulmonary embolism without acute cor pulmonale: Secondary | ICD-10-CM

## 2024-01-24 DIAGNOSIS — E538 Deficiency of other specified B group vitamins: Secondary | ICD-10-CM | POA: Diagnosis not present

## 2024-01-24 DIAGNOSIS — E119 Type 2 diabetes mellitus without complications: Secondary | ICD-10-CM

## 2024-01-24 DIAGNOSIS — I10 Essential (primary) hypertension: Secondary | ICD-10-CM

## 2024-01-24 DIAGNOSIS — I152 Hypertension secondary to endocrine disorders: Secondary | ICD-10-CM

## 2024-01-24 MED ORDER — ONDANSETRON 4 MG PO TBDP: 4.0000 mg | ORAL_TABLET | Freq: Three times a day (TID) | ORAL | 0 refills | Status: DC | PRN

## 2024-01-24 MED ORDER — DILTIAZEM HCL ER 120 MG PO CP24: 120.0000 mg | ORAL_CAPSULE | Freq: Every day | ORAL | 1 refills | Status: AC

## 2024-01-24 MED ORDER — APIXABAN 5 MG PO TABS: 5.0000 mg | ORAL_TABLET | Freq: Two times a day (BID) | ORAL | 1 refills | Status: AC

## 2024-01-24 NOTE — Assessment & Plan Note (Signed)
 BP soft today- pt is asymptomatic.  She is eating in drinking, has had some mild nausea and vomited a few times.  Will decrease diltiazem  CD from 240 to 120 mg. She is advised to monitor her BP at home and let me know if SBP does not rise above 100 with this medication adjustment.

## 2024-01-24 NOTE — Progress Notes (Signed)
 "  Subjective:     Patient ID: Amber [name], female    DOB: 08-20-1960, 64 y.o.   MRN: [id number]  Chief Complaint  Patient presents with   Follow-up    Here for hospital follow up    HPI  Discussed the use of AI scribe software for clinical note transcription with the patient, who gave verbal consent to proceed.  History of Present Illness Amber [name] is a 64 year old female with a history of pulmonary embolism who presents for follow-up after recent hospitalization. She is accompanied by her sister.  She experienced five days of shortness of breath, leading to an ER visit where a CT angiogram revealed a pulmonary embolism. No recent long travel or known triggers were identified. She recalls having edema in her foot around Thanksgiving. She was hospitalized from January 16th to January 20th and was discharged with a prescription for Eliquis . She was discharged with a prescription for Eliquis  and received a starter pack upon discharge.  She reports intermittent chest pain and shortness of breath, which vary with activity. She also experiences nausea and vomiting, having vomited once yesterday and again this morning. No diarrhea is present. She ran out of Zofran , which she had been using to manage nausea.  Her blood pressure was low during her recent hospitalization, with episodes of dizziness when changing positions.  She has a history of rheumatoid arthritis and is considering disability due to her multiple health issues. She is currently employed as an PUBLIC HOUSE MANAGER but is exploring options for short-term and long-term disability benefits.  She mentions a past episode of low oxygen saturation, which dropped to 88% during her hospitalization. She is also taking B12 supplements, previously administered as injections but now taken orally.  CT Angio 01/16/24:   IMPRESSION: 1. Pulmonary emboli in the distal right main pulmonary artery extending into lobar and segmental branches of  the right middle and right lower lobes, as well as segmental right upper lobe pulmonary emboli, with additional segmental and subsegmental left upper lobe and subsegmental left lower lobe pulmonary emboli. CT evidence of right heart strain (RV/LV Ratio = 1.0) consistent with at least submassive (intermediate risk) PE. The presence of right heart strain has been associated with an increased risk of morbidity and mortality. Please activate Code PE / PE Focused Oder set in [facility] for further guidance. 2. Mild patchy airspace disease in the bilateral lung bases. 3. Trace left pleural effusion.      Health Maintenance Due  Topic Date Due   COVID-19 Vaccine (6 - 2025-26 season) 09/02/2023   Pneumococcal Vaccine: 50+ Years (3 of 3 - PCV20 or PCV21) 09/10/2023    Past Medical History:  Diagnosis Date   Anemia    Arthritis    Asthma 05/03/2020   Back pain    Blood transfusion without reported diagnosis    CAD (coronary artery disease) 04/24/2017   Cataract    Cerebrovascular disease 10/13/2020   Chronic diastolic CHF (congestive heart failure) (HCC) 04/24/2017   Colon polyps    Controlled type 2 diabetes mellitus without complication, without long-term current use of insulin  (HCC) 05/31/2017   Diabetic peripheral neuropathy (HCC) 01/05/2020   Ectopic pregnancy    RIGHT salpingostomy   Elective abortion    Elective abortion 09/26/2020   RIGHT salpingostomy   Essential hypertension 09/04/2017   GERD (gastroesophageal reflux disease)    Glaucoma    both eyes   Headache    migraines   History of chicken pox  History of chicken pox 09/26/2020   migraines   Hyperlipemia    Hypertension    Iron  deficiency anemia 09/02/2013   Joint pain    Migraine 04/19/2014   Obesity (BMI 30-39.9) 10/17/2012   Palpitations 10/01/2012   Pre-diabetes    Rheumatoid arthritis (HCC) 11/07/2011   Rheumatoid arthritis(714.0) 11/07/2011   SAB (spontaneous abortion)    SAB (spontaneous abortion)  11/07/2011   Shortness of breath    Sjogren's disease    Swelling of lower extremity    Vitamin B12 deficiency    Wheezing     Past Surgical History:  Procedure Laterality Date   ABDOMINAL SURGERY     bowel repair   CATARACT EXTRACTION     02/25/2023, 03/11/2023   COLONOSCOPY     COLONOSCOPY  08/2022   DILATION AND CURETTAGE OF UTERUS     X 2   KNEE ARTHROSCOPY W/ MENISCAL REPAIR Right 01/16/2023   MENISCUS REPAIR Right    MENISCUS TEAR REPAIR   MYOMECTOMY N/A 07/27/2014   Procedure: ABDOMINAL MYOMECTOMY;  Surgeon: Jerolyn Foil, MD;  Location: [facility];  Service: Gynecology;  Laterality: N/A;   PELVIC LAPAROSCOPY  1994   IN 1999 LAPAROSCOPY WITH INCIDENTAL ENTEROTOMY REQUIRING LAPAROTOMY   ROTATOR CUFF REPAIR Left 06/21/2021   UTERINE FIBROID SURGERY     July 2016    Family History  Problem Relation Age of Onset   Hypertension Mother        died from heart disease   Heart disease Mother        deceased, unknown cause   Stroke Mother    Obesity Mother    High Cholesterol Mother    Sudden death Father    Diabetes Sister    Hypertension Sister    Esophageal cancer Sister    Stomach cancer Sister    Cancer Sister    Diabetes Sister    Parkinson's disease Brother    Rectal cancer Neg Hx    Colon cancer Neg Hx    Colon polyps Neg Hx    Breast cancer Neg Hx     Social History   Socioeconomic History   Marital status: Single    Spouse name: Not on file   Number of children: 0   Years of education: college   Highest education level: Associate degree: occupational, scientist, product/process development, or vocational program  Occupational History   Occupation: LPN    Employer: [employer]  Tobacco Use   Smoking status: Never    Passive exposure: Never   Smokeless tobacco: Never  Vaping Use   Vaping status: Never Used  Substance and Sexual Activity   Alcohol use: No    Comment: occasional   Drug use: No   Sexual activity: Yes    Birth control/protection: None    Comment: 1st intercourse-  17, partners- 5  Other Topics Concern   Not on file  Social History Narrative   Regular exercise:  No   Caffeine Use: 1 cup coffee daily   No children   Happily divorced   Reports that she is involved at her church   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  Had a sister up in [place]  who passed away 02/13/2015 who she was close to.   Born in [place]- she came to [place]  as adult.  Age 43.    Left-handed.         Social Drivers of Health   Tobacco Use: Low Risk (01/17/2024)   Patient History  Smoking Tobacco Use: Never    Smokeless Tobacco Use: Never    Passive Exposure: Never  Financial Resource Strain: Medium Risk (11/27/2023)   Overall Financial Resource Strain (CARDIA)    Difficulty of Paying Living Expenses: Somewhat hard  Food Insecurity: Food Insecurity Present (01/23/2024)   Epic    Worried About Programme Researcher, Broadcasting/film/video in the Last Year: Never true    Ran Out of Food in the Last Year: Sometimes true  Transportation Needs: No Transportation Needs (01/23/2024)   Epic    Lack of Transportation (Medical): No    Lack of Transportation (Non-Medical): No  Physical Activity: Inactive (01/25/2023)   Exercise Vital Sign    Days of Exercise per Week: 0 days    Minutes of Exercise per Session: 20 min  Stress: No Stress Concern Present (01/25/2023)   [employer] of Occupational Health - Occupational Stress Questionnaire    Feeling of Stress : Not at all  Social Connections: Moderately Integrated (11/27/2023)   Social Connection and Isolation Panel    Frequency of Communication with Friends and Family: More than three times a week    Frequency of Social Gatherings with Friends and Family: Once a week    Attends Religious Services: More than 4 times per year    Active Member of [employer] or Organizations: Yes    Attends Banker Meetings: Not on file    Marital Status: Divorced  Intimate Partner Violence: Not At Risk (01/23/2024)   Epic    Fear of Current or  Ex-Partner: No    Emotionally Abused: No    Physically Abused: No    Sexually Abused: No  Depression (PHQ2-9): Low Risk (08/06/2023)   Depression (PHQ2-9)    PHQ-2 Score: 0  Alcohol Screen: Not on file  Housing: Unknown (01/23/2024)   Epic    Unable to Pay for Housing in the Last Year: No    Number of Times Moved in the Last Year: Not on file    Homeless in the Last Year: No  Utilities: Not At Risk (01/23/2024)   Epic    Threatened with loss of utilities: No  Health Literacy: Not on file    Outpatient Medications Prior to Visit  Medication Sig Dispense Refill   albuterol  (VENTOLIN  HFA) 108 (90 Base) MCG/ACT inhaler Inhale 2 puffs into the lungs every 6 (six) hours as needed. (Patient taking differently: Inhale 2 puffs into the lungs every 6 (six) hours as needed for shortness of breath or wheezing.) 18 g 0   Aspirin  81 MG CAPS Take 81 mg by mouth daily at 12 noon.     brimonidine-timolol (COMBIGAN) 0.2-0.5 % ophthalmic solution Place 1 drop into both eyes every 12 (twelve) hours.     calcium -vitamin D  (OSCAL WITH D) 500-200 MG-UNIT TABS tablet Take 1 tablet by mouth daily.     carvedilol  (COREG ) 12.5 MG tablet Take 1 tablet (12.5 mg total) by mouth 2 (two) times daily with a meal. 180 tablet 1   cyanocobalamin  (VITAMIN B12) 1000 MCG/ML injection 1000mcg IM weekly x 4 weeks then once monthly 12 mL 0   gabapentin  (NEURONTIN ) 100 MG capsule Take 1 capsule (100 mg total) by mouth at bedtime. 90 capsule 1   Iron , Ferrous Sulfate , 325 (65 Fe) MG TABS Take 325 mg by mouth 2 (two) times daily. 30 tablet    nystatin  (MYCOSTATIN /NYSTOP ) powder Apply 1 Application topically 3 (three) times daily. (Patient taking differently: Apply 1 Application topically 2 (two) times daily.) 30  g 1   ORENCIA  CLICKJECT 125 MG/ML SOAJ INJECT 125 MG UNDER THE SKIN EVERY 7 DAYS (Patient taking differently: Inject 125 mg into the skin See admin instructions. Every 10 days) 4 mL 1   oxyCODONE  (OXY IR/ROXICODONE ) 5 MG  immediate release tablet Take 1 tablet (5 mg total) by mouth every 6 (six) hours as needed for moderate pain (pain score 4-6) or severe pain (pain score 7-10). 12 tablet 0   polyethylene glycol (MIRALAX  / GLYCOLAX ) 17 g packet Take 17 g by mouth 2 (two) times daily. Until stooling regularly. Then once daily or prn (Patient taking differently: Take 17 g by mouth daily as needed for mild constipation, moderate constipation or severe constipation.)     rosuvastatin  (CRESTOR ) 10 MG tablet Take 1 tablet (10 mg total) by mouth daily. 90 tablet 1   SUMAtriptan  (IMITREX ) 100 MG tablet Take 1 tablet (100 mg total) by mouth as needed for migraine. May repeat in 2 hours if headache persists or recurs. 30 tablet 5   Syringe/Needle, Disp, (SYRINGE 3CC/22GX1-1/2) 22G X 1-1/2 3 ML MISC Use as directed 50 each 0   tirzepatide  (MOUNJARO ) 7.5 MG/0.5ML Pen Inject 7.5 mg into the skin once a week. 6 mL 0   TRAVATAN Z 0.004 % SOLN ophthalmic solution Place 1 drop into both eyes at bedtime.      triamterene -hydrochlorothiazide  (MAXZIDE -25) 37.5-25 MG tablet Take 1 tablet by mouth daily.     Vitamin D , Ergocalciferol , (DRISDOL ) 1.25 MG (50000 UNIT) CAPS capsule Take 1 capsule (50,000 Units total) by mouth every 7 (seven) days. 13 capsule 0   APIXABAN  (ELIQUIS ) VTE STARTER PACK (10MG  AND 5MG ) Take as directed on package: start with two-5mg  tablets twice daily for 7 days. On day 8, switch to one-5mg  tablet twice daily. 74 each 0   diltiazem  (CARTIA  XT) 240 MG 24 hr capsule Take 1 capsule (240 mg total) by mouth daily. 90 capsule 1   No facility-administered medications prior to visit.    Allergies[1]  ROS    See HPI Objective:    Physical Exam Constitutional:      General: She is not in acute distress.    Appearance: Normal appearance. She is well-developed.  HENT:     Head: Normocephalic and atraumatic.     Right Ear: External ear normal.     Left Ear: External ear normal.  Eyes:     General: No scleral  icterus. Neck:     Thyroid : No thyromegaly.  Cardiovascular:     Rate and Rhythm: Normal rate and regular rhythm.     Heart sounds: Normal heart sounds. No murmur heard. Pulmonary:     Effort: Pulmonary effort is normal. No respiratory distress.     Breath sounds: Normal breath sounds. No wheezing.  Musculoskeletal:     Cervical back: Neck supple.  Skin:    General: Skin is warm and dry.  Neurological:     Mental Status: She is alert and oriented to person, place, and time.  Psychiatric:        Mood and Affect: Mood normal.        Behavior: Behavior normal.        Thought Content: Thought content normal.        Judgment: Judgment normal.      BP (!) 86/49 (BP Location: Right Arm, Patient Position: Sitting, Cuff Size: Large)   Pulse 66   Temp 98.6 F (37 C) (Oral)   Resp 16   Ht 5' (1.524  m)   Wt 170 lb (77.1 kg)   LMP 08/09/2016   SpO2 100%   BMI 33.20 kg/m  Wt Readings from Last 3 Encounters:  01/24/24 170 lb (77.1 kg)  01/17/24 167 lb (75.8 kg)  12/24/23 166 lb (75.3 kg)       Assessment & Plan:   Problem List Items Addressed This Visit       Unprioritized   Pulmonary embolism (HCC) - Primary   Will continue Eliquis  with treatment duration likely 3-6 months. PE was unprovoked so would benefit from a hypercoaguable panel at some point.  Will refer to Hematology for further evaluation.       Relevant Medications   diltiazem  (DILACOR XR ) 120 MG 24 hr capsule   apixaban  (ELIQUIS ) 5 MG TABS tablet   Other Relevant Orders   Ambulatory referral to Hematology / Oncology   Basic Metabolic Panel (BMET)   CBC w/Diff   Essential hypertension    BP Readings from Last 3 Encounters:  01/24/24 (!) 86/49  01/21/24 115/77  12/24/23 (!) 88/65   BP soft today- pt is asymptomatic.  She is eating in drinking, has had some mild nausea and vomited a few times.  Will decrease diltiazem  CD from 240 to 120 mg. She is advised to monitor her BP at home and let me know if SBP  does not rise above 100 with this medication adjustment.      Relevant Medications   diltiazem  (DILACOR XR ) 120 MG 24 hr capsule   apixaban  (ELIQUIS ) 5 MG TABS tablet   Controlled type 2 diabetes mellitus without complication, without long-term current use of insulin  [facility])   Lab Results  Component Value Date   HGBA1C 5.6 12/24/2023   HGBA1C 6.1 08/06/2023   HGBA1C 6.4 04/26/2023   Lab Results  Component Value Date   MICROALBUR 0.6 12/20/2023   LDLCALC 49 12/05/2022   CREATININE 0.68 01/20/2024  Stable on tirzepatide .         B12 deficiency   Relevant Orders   B12   Other Visit Diagnoses       Nausea       Relevant Medications   ondansetron  (ZOFRAN -ODT) 4 MG disintegrating tablet     Hypertension associated with diabetes (HCC)       Relevant Medications   diltiazem  (DILACOR XR ) 120 MG 24 hr capsule   apixaban  (ELIQUIS ) 5 MG TABS tablet       I have discontinued Arlet D. President's diltiazem  and Apixaban  Starter Pack (10mg  and 5mg ). I am also having her start on ondansetron , diltiazem , and apixaban . Additionally, I am having her maintain her Travatan Z, calcium -vitamin D , Iron  (Ferrous Sulfate ), albuterol , SUMAtriptan , brimonidine-timolol, SYRINGE 3CC/22GX1-1/2, Aspirin , carvedilol , gabapentin , cyanocobalamin , rosuvastatin , Orencia  ClickJect, polyethylene glycol, nystatin , Vitamin D  (Ergocalciferol ), tirzepatide , triamterene -hydrochlorothiazide , and oxyCODONE .  Meds ordered this encounter  Medications   ondansetron  (ZOFRAN -ODT) 4 MG disintegrating tablet    Sig: Take 1 tablet (4 mg total) by mouth every 8 (eight) hours as needed for nausea or vomiting.    Dispense:  30 tablet    Refill:  0    Supervising Provider:   DOMENICA BLACKBIRD A [[id number]]   diltiazem  (DILACOR XR ) 120 MG 24 hr capsule    Sig: Take 1 capsule (120 mg total) by mouth daily.    Dispense:  90 capsule    Refill:  1    Supervising Provider:   DOMENICA BLACKBIRD A [[id number]]   apixaban  (ELIQUIS ) 5 MG TABS tablet     Sig:  Take 1 tablet (5 mg total) by mouth 2 (two) times daily.    Dispense:  60 tablet    Refill:  1    Supervising Provider:   DOMENICA BLACKBIRD A [[id number]]      [1]  Allergies Allergen Reactions   Gabapentin  Other (See Comments)    Made her crazy   "

## 2024-01-24 NOTE — Patient Instructions (Signed)
" °  VISIT SUMMARY: During your visit, we discussed your recent hospitalization for a pulmonary embolism and your ongoing symptoms, including intermittent chest pain, shortness of breath, nausea, and vomiting. We also reviewed your history of rheumatoid arthritis, type 2 diabetes, and essential hypertension.  YOUR PLAN: -PULMONARY EMBOLISM: A pulmonary embolism is a blood clot in the lungs. You should continue taking Eliquis  as prescribed and monitor your blood pressure at home. We will coordinate with hematology for follow-up and potential clotting disorder evaluation. Your diltiazem  dose has been decreased to 120 mg daily, and we have ordered lab tests to recheck your kidney function and blood count.  -NAUSEA AND VOMITING: You have been experiencing nausea and vomiting. We have prescribed Zofran  to help manage these symptoms.  -VITAMIN B12 DEFICIENCY: Vitamin B12 deficiency can cause fatigue and weakness. We have checked your B12 levels to determine if you need to return to injections.  -TYPE 2 DIABETES MELLITUS: Type 2 diabetes is a condition where your body does not use insulin  properly. Your diabetes is well-controlled with a recent A1c of 5.6 on tirzepatide .  -ESSENTIAL HYPERTENSION: Essential hypertension is high blood pressure with no identifiable cause. Your blood pressure has been low, possibly due to pain and anxiety. We have adjusted your diltiazem  dose to 120 mg daily and ask that you monitor your blood pressure at home and report if it does not exceed 100 mmHg or if dizziness occurs.  INSTRUCTIONS: Please monitor your blood pressure at home and report if it does not exceed 100 mmHg or if dizziness occurs. We will coordinate with hematology for follow-up and potential clotting disorder evaluation. Lab tests have been ordered to recheck your kidney function and blood count. Follow up with us  if you experience any new or worsening symptoms.    Contains text generated by [employer].   "

## 2024-01-24 NOTE — Assessment & Plan Note (Addendum)
" °  BP Readings from Last 3 Encounters:  01/24/24 (!) 86/49  01/21/24 115/77  12/24/23 (!) 88/65   BP soft today- pt is asymptomatic.  She is eating in drinking, has had some mild nausea and vomited a few times.  Will decrease diltiazem  CD from 240 to 120 mg. She is advised to monitor her BP at home and let me know if SBP does not rise above 100 with this medication adjustment. "

## 2024-01-24 NOTE — Assessment & Plan Note (Signed)
 Will continue Eliquis  with treatment duration likely 3-6 months. PE was unprovoked so would benefit from a hypercoaguable panel at some point.  Will refer to Hematology for further evaluation.

## 2024-01-24 NOTE — Assessment & Plan Note (Signed)
 Lab Results  Component Value Date   HGBA1C 5.6 12/24/2023   HGBA1C 6.1 08/06/2023   HGBA1C 6.4 04/26/2023   Lab Results  Component Value Date   MICROALBUR 0.6 12/20/2023   LDLCALC 49 12/05/2022   CREATININE 0.68 01/20/2024  Stable on tirzepatide .

## 2024-01-25 ENCOUNTER — Ambulatory Visit: Payer: Self-pay | Admitting: Family

## 2024-01-25 ENCOUNTER — Other Ambulatory Visit: Payer: Self-pay

## 2024-01-25 ENCOUNTER — Emergency Department (HOSPITAL_BASED_OUTPATIENT_CLINIC_OR_DEPARTMENT_OTHER)

## 2024-01-25 ENCOUNTER — Encounter (HOSPITAL_BASED_OUTPATIENT_CLINIC_OR_DEPARTMENT_OTHER): Payer: Self-pay

## 2024-01-25 ENCOUNTER — Emergency Department (HOSPITAL_BASED_OUTPATIENT_CLINIC_OR_DEPARTMENT_OTHER)
Admission: EM | Admit: 2024-01-25 | Discharge: 2024-01-25 | Disposition: A | Discharge status: Home / Self Care | Attending: Emergency Medicine | Admitting: Emergency Medicine

## 2024-01-25 DIAGNOSIS — I2510 Atherosclerotic heart disease of native coronary artery without angina pectoris: Secondary | ICD-10-CM | POA: Diagnosis not present

## 2024-01-25 DIAGNOSIS — E119 Type 2 diabetes mellitus without complications: Secondary | ICD-10-CM | POA: Diagnosis not present

## 2024-01-25 DIAGNOSIS — Z7901 Long term (current) use of anticoagulants: Secondary | ICD-10-CM | POA: Insufficient documentation

## 2024-01-25 DIAGNOSIS — R748 Abnormal levels of other serum enzymes: Secondary | ICD-10-CM | POA: Insufficient documentation

## 2024-01-25 DIAGNOSIS — R112 Nausea with vomiting, unspecified: Secondary | ICD-10-CM

## 2024-01-25 DIAGNOSIS — N179 Acute kidney failure, unspecified: Secondary | ICD-10-CM | POA: Diagnosis not present

## 2024-01-25 DIAGNOSIS — I2699 Other pulmonary embolism without acute cor pulmonale: Secondary | ICD-10-CM | POA: Insufficient documentation

## 2024-01-25 DIAGNOSIS — J45909 Unspecified asthma, uncomplicated: Secondary | ICD-10-CM | POA: Insufficient documentation

## 2024-01-25 DIAGNOSIS — E860 Dehydration: Secondary | ICD-10-CM | POA: Insufficient documentation

## 2024-01-25 DIAGNOSIS — I110 Hypertensive heart disease with heart failure: Secondary | ICD-10-CM | POA: Diagnosis not present

## 2024-01-25 DIAGNOSIS — I5032 Chronic diastolic (congestive) heart failure: Secondary | ICD-10-CM | POA: Diagnosis not present

## 2024-01-25 DIAGNOSIS — R10A2 Flank pain, left side: Secondary | ICD-10-CM | POA: Diagnosis present

## 2024-01-25 HISTORY — DX: Other pulmonary embolism without acute cor pulmonale: I26.99

## 2024-01-25 LAB — COMPREHENSIVE METABOLIC PANEL WITH GFR
ALT: 23 U/L (ref 0–44)
AST: 24 U/L (ref 15–41)
Albumin: 3.9 g/dL (ref 3.5–5.0)
Alkaline Phosphatase: 65 U/L (ref 38–126)
Anion gap: 12 (ref 5–15)
BUN: 37 mg/dL — ABNORMAL HIGH (ref 8–23)
CO2: 23 mmol/L (ref 22–32)
Calcium: 10.2 mg/dL (ref 8.9–10.3)
Chloride: 103 mmol/L (ref 98–111)
Creatinine, Ser: 1.79 mg/dL — ABNORMAL HIGH (ref 0.44–1.00)
GFR, Estimated: 32 mL/min — ABNORMAL LOW
Glucose, Bld: 86 mg/dL (ref 70–99)
Potassium: 4.1 mmol/L (ref 3.5–5.1)
Sodium: 138 mmol/L (ref 135–145)
Total Bilirubin: 0.4 mg/dL (ref 0.0–1.2)
Total Protein: 9 g/dL — ABNORMAL HIGH (ref 6.5–8.1)

## 2024-01-25 LAB — CBC WITH DIFFERENTIAL/PLATELET
Absolute Lymphocytes: 1472 {cells}/uL (ref 850–3900)
Absolute Monocytes: 258 {cells}/uL (ref 200–950)
Basophils Absolute: 31 {cells}/uL (ref 0–200)
Basophils Relative: 0.9 %
Eosinophils Absolute: 119 {cells}/uL (ref 15–500)
Eosinophils Relative: 3.5 %
HCT: 39.5 % (ref 35.9–46.0)
Hemoglobin: 12.4 g/dL (ref 11.7–15.5)
MCH: 27.8 pg (ref 27.0–33.0)
MCHC: 31.4 g/dL — ABNORMAL LOW (ref 31.6–35.4)
MCV: 88.6 fL (ref 81.4–101.7)
MPV: 10 fL (ref 7.5–12.5)
Monocytes Relative: 7.6 %
Neutro Abs: 1520 {cells}/uL (ref 1500–7800)
Neutrophils Relative %: 44.7 %
Platelets: 317 Thousand/uL (ref 140–400)
RBC: 4.46 Million/uL (ref 3.80–5.10)
RDW: 13 % (ref 11.0–15.0)
Total Lymphocyte: 43.3 %
WBC: 3.4 Thousand/uL — ABNORMAL LOW (ref 3.8–10.8)

## 2024-01-25 LAB — BASIC METABOLIC PANEL WITH GFR
BUN/Creatinine Ratio: 15 (calc) (ref 6–22)
BUN: 39 mg/dL — ABNORMAL HIGH (ref 7–25)
CO2: 26 mmol/L (ref 20–32)
Calcium: 9.8 mg/dL (ref 8.6–10.4)
Chloride: 103 mmol/L (ref 98–110)
Creat: 2.62 mg/dL — ABNORMAL HIGH (ref 0.50–1.05)
Glucose, Bld: 92 mg/dL (ref 65–99)
Potassium: 4.6 mmol/L (ref 3.5–5.3)
Sodium: 139 mmol/L (ref 135–146)
eGFR: 20 mL/min/1.73m2 — ABNORMAL LOW

## 2024-01-25 LAB — URINALYSIS, ROUTINE W REFLEX MICROSCOPIC
Bilirubin Urine: NEGATIVE
Glucose, UA: NEGATIVE mg/dL
Hgb urine dipstick: NEGATIVE
Ketones, ur: NEGATIVE mg/dL
Leukocytes,Ua: NEGATIVE
Nitrite: NEGATIVE
Protein, ur: NEGATIVE mg/dL
Specific Gravity, Urine: 1.015 (ref 1.005–1.030)
pH: 5 (ref 5.0–8.0)

## 2024-01-25 LAB — LIPASE, BLOOD: Lipase: 91 U/L — ABNORMAL HIGH (ref 11–51)

## 2024-01-25 LAB — CREATININE, SERUM
Creatinine, Ser: 1.57 mg/dL — ABNORMAL HIGH (ref 0.44–1.00)
GFR, Estimated: 37 mL/min — ABNORMAL LOW

## 2024-01-25 LAB — CBC
HCT: 37.9 % (ref 36.0–46.0)
Hemoglobin: 12.2 g/dL (ref 12.0–15.0)
MCH: 27.9 pg (ref 26.0–34.0)
MCHC: 32.2 g/dL (ref 30.0–36.0)
MCV: 86.7 fL (ref 80.0–100.0)
Platelets: 313 10*3/uL (ref 150–400)
RBC: 4.37 MIL/uL (ref 3.87–5.11)
RDW: 14 % (ref 11.5–15.5)
WBC: 3.9 10*3/uL — ABNORMAL LOW (ref 4.0–10.5)
nRBC: 0 % (ref 0.0–0.2)

## 2024-01-25 LAB — VITAMIN B12: Vitamin B-12: >2000 pg/mL — ABNORMAL HIGH (ref 200–1100)

## 2024-01-25 MED ORDER — ONDANSETRON 4 MG PO TBDP: ORAL_TABLET | ORAL | 0 refills | Status: AC

## 2024-01-25 MED ORDER — SODIUM CHLORIDE 0.9 % IV BOLUS
1000.0000 mL | Freq: Once | INTRAVENOUS | Status: AC
  Administered 2024-01-25: 1000 mL via INTRAVENOUS

## 2024-01-25 NOTE — Discharge Instructions (Signed)
 You likely have stomach virus.  Please stay hydrated  Take Zofran  as needed for nausea  See your doctor for follow-up in the week to recheck kidney function  Return to ER if you have persistent vomiting or severe abdominal pain or dehydration

## 2024-01-25 NOTE — ED Provider Notes (Signed)
 "  Emergency Department Provider Note   I have reviewed the triage vital signs and the nursing notes.   HISTORY  Chief Complaint Flank Pain and Abnormal Lab   HPI Amber [name] is a 64 y.o. female with recent diagnosis of PE discharged on 1/20 with Eliquis  presents to the emergency department with left flank pain and abnormal kidney labs.  She had normal renal function during her hospitalization.  Since discharge she developed vomiting and poor p.o. intake.  She continues to urinate but also developed some left-sided flank pain.  She has saw her PCP yesterday as a follow-up from her admit and was found to have elevated serum creatinine on labs today.  She was called and asked to present to the ED for fluids.  Her vomiting has stopped with Zofran .  She still has some mild nausea.  No severe diarrhea.  No fevers.  No chest pain or shortness of breath.  She remains compliant with her Eliquis  and is not seeing signs of blood    Past Medical History:  Diagnosis Date   Anemia    Arthritis    Asthma 05/03/2020   Back pain    Blood transfusion without reported diagnosis    CAD (coronary artery disease) 04/24/2017   Cataract    Cerebrovascular disease 10/13/2020   Chronic diastolic CHF (congestive heart failure) (HCC) 04/24/2017   Colon polyps    Controlled type 2 diabetes mellitus without complication, without Reeve Mallo-term current use of insulin  (HCC) 05/31/2017   Diabetic peripheral neuropathy (HCC) 01/05/2020   Ectopic pregnancy    RIGHT salpingostomy   Elective abortion    Elective abortion 09/26/2020   RIGHT salpingostomy   Essential hypertension 09/04/2017   GERD (gastroesophageal reflux disease)    Glaucoma    both eyes   Headache    migraines   History of chicken pox    History of chicken pox 09/26/2020   migraines   Hyperlipemia    Hypertension    Iron  deficiency anemia 09/02/2013   Joint pain    Migraine 04/19/2014   Obesity (BMI 30-39.9) 10/17/2012    Palpitations 10/01/2012   Pre-diabetes    Rheumatoid arthritis (HCC) 11/07/2011   Rheumatoid arthritis(714.0) 11/07/2011   SAB (spontaneous abortion)    SAB (spontaneous abortion) 11/07/2011   Shortness of breath    Sjogren's disease    Swelling of lower extremity    Vitamin B12 deficiency    Wheezing     Review of Systems  Constitutional: No fever/chills Cardiovascular: Denies chest pain. Respiratory: Denies shortness of breath. Gastrointestinal: Left flank/abdominal pain. Positive nausea and vomiting.  No diarrhea.  No constipation. Genitourinary: Negative for dysuria. Neurological: Negative for headaches.  ____________________________________________   PHYSICAL EXAM:  VITAL SIGNS: ED Triage Vitals  Encounter Vitals Group     BP 01/25/24 1206 102/64     Pulse Rate 01/25/24 1206 82     Resp 01/25/24 1206 16     Temp 01/25/24 1206 98.1 F (36.7 C)     Temp Source 01/25/24 1206 Oral     SpO2 01/25/24 1206 100 %   Constitutional: Alert and oriented. Well appearing and in no acute distress. Eyes: Conjunctivae are normal. Head: Atraumatic. Nose: No congestion/rhinnorhea. Mouth/Throat: Mucous membranes are moist.   Neck: No stridor.   Cardiovascular: Normal rate, regular rhythm. Good peripheral circulation. Grossly normal heart sounds.   Respiratory: Normal respiratory effort.  No retractions. Lungs CTAB. Gastrointestinal: Soft with mild tenderness in the left abdomen. No masses. No  peritonitis. No distention.  Musculoskeletal: No lower extremity tenderness nor edema. No gross deformities of extremities. Neurologic:  Normal speech and language. No gross focal neurologic deficits are appreciated.  Skin:  Skin is warm, dry and intact. No rash noted.  ____________________________________________   LABS (all labs ordered are listed, but only abnormal results are displayed)  Labs Reviewed  CBC - Abnormal; Notable for the following components:      Result Value   WBC  3.9 (*)    All other components within normal limits  LIPASE, BLOOD  COMPREHENSIVE METABOLIC PANEL WITH GFR  URINALYSIS, ROUTINE W REFLEX MICROSCOPIC   ____________________________________________  RADIOLOGY  No results found.  ____________________________________________   PROCEDURES  Procedure(s) performed:   Procedures  None  ____________________________________________   INITIAL IMPRESSION / ASSESSMENT AND PLAN / ED COURSE  Pertinent labs & imaging results that were available during my care of the patient were reviewed by me and considered in my medical decision making (see chart for details).   This patient is Presenting for Evaluation of abdominal pain, which does require a range of treatment options, and is a complaint that involves a high risk of morbidity and mortality.  The Differential Diagnoses includes but is not exclusive to acute appendicitis, urinary tract infection, endometriosis, bowel obstruction, hernia, colitis, renal colic, gastroenteritis, volvulus etc.   Critical Interventions-    Medications  sodium chloride  0.9 % bolus 1,000 mL (has no administration in time range)    Reassessment after intervention:  symptoms improved.   I decided to review pertinent External Data, and in summary Creatinine from yesterday is 2.62.   Clinical Laboratory Tests Ordered, included Creatinine 1.79 down from 2.62. Normal K. Mildly elevated lipase to 91. Normal LFTs.   Radiologic Tests Ordered, included CT renal. I independently interpreted the images and agree with radiology interpretation.   Cardiac Monitor Tracing which shows NSR.    Social Determinants of Health Risk patient is a non-smoker.   Consult complete with  Medical Decision Making: Summary:  The patient presents emergency department with abnormal kidney labs from PCP follow-up yesterday.  She has had GI losses and poor appetite but those symptoms are improved from yesterday after receiving Zofran   from her PCP.  Plan for repeat kidney labs here, IV fluids, CT renal given left flank pain.  Reevaluation with update and discussion with   ***Considered admission***  Patient's presentation is most consistent with {EM COPA:27473}   Disposition:   ____________________________________________  FINAL CLINICAL IMPRESSION(S) / ED DIAGNOSES  Final diagnoses:  None     NEW OUTPATIENT MEDICATIONS STARTED DURING THIS VISIT:  New Prescriptions   No medications on file    Note:  This document was prepared using Dragon voice recognition software and may include unintentional dictation errors.  Fonda Law, MD, [facility]  "

## 2024-01-25 NOTE — ED Notes (Signed)

## 2024-01-25 NOTE — ED Triage Notes (Signed)
 Sent by PCP for elevated kidney function. L sided flank pain, N/V for 1 week.  Denies urinary symptoms

## 2024-01-25 NOTE — ED Provider Notes (Signed)
" °  Physical Exam  BP 102/64 (BP Location: Right Arm)   Pulse 82   Temp 98.1 F (36.7 C) (Oral)   Resp 16   LMP 08/09/2016   SpO2 100%   Physical Exam  Procedures  Procedures  ED Course / MDM    Medical Decision Making Patient is here with AKI.  Patient has been vomiting.  Patient received IV fluids and signout pending repeat creatinine  3:41 PM Reviewed patient's labs and patient's creatinine was 2.6 several days ago.  Initial creatinine was 1.79 and repeat creatinine now is 1.57.  Patient is not vomiting in the ED.  She states that she feels better after IV fluids.  Will discharge home with Zofran  as needed.  Problems Addressed: AKI (acute kidney injury): acute illness or injury Dehydration: acute illness or injury Nausea and vomiting, unspecified vomiting type: acute illness or injury  Amount and/or Complexity of Data Reviewed Labs: ordered. Decision-making details documented in ED Course. Radiology: ordered and independent interpretation performed. Decision-making details documented in ED Course.         Patt Alm Macho, MD 01/25/24 1542  "

## 2024-01-28 ENCOUNTER — Ambulatory Visit (INDEPENDENT_AMBULATORY_CARE_PROVIDER_SITE_OTHER): Admitting: Adult Health

## 2024-02-05 ENCOUNTER — Ambulatory Visit: Admitting: Family

## 2024-02-05 VITALS — BP 116/65 | HR 71 | Temp 98.3°F | Resp 16 | Ht 60.0 in | Wt 177.0 lb

## 2024-02-05 DIAGNOSIS — I10 Essential (primary) hypertension: Secondary | ICD-10-CM

## 2024-02-05 DIAGNOSIS — I2699 Other pulmonary embolism without acute cor pulmonale: Secondary | ICD-10-CM

## 2024-02-05 DIAGNOSIS — N289 Disorder of kidney and ureter, unspecified: Secondary | ICD-10-CM | POA: Insufficient documentation

## 2024-02-05 LAB — COMPREHENSIVE METABOLIC PANEL WITH GFR
ALT: 12 U/L (ref 3–35)
AST: 15 U/L (ref 5–37)
Albumin: 3.4 g/dL — ABNORMAL LOW (ref 3.5–5.2)
Alkaline Phosphatase: 42 U/L (ref 39–117)
BUN: 10 mg/dL (ref 6–23)
CO2: 28 meq/L (ref 19–32)
Calcium: 9.2 mg/dL (ref 8.4–10.5)
Chloride: 108 meq/L (ref 96–112)
Creatinine, Ser: 0.74 mg/dL (ref 0.40–1.20)
GFR: 86.6 mL/min
Glucose, Bld: 91 mg/dL (ref 70–99)
Potassium: 4.4 meq/L (ref 3.5–5.1)
Sodium: 143 meq/L (ref 135–145)
Total Bilirubin: 0.4 mg/dL (ref 0.2–1.2)
Total Protein: 6.9 g/dL (ref 6.0–8.3)

## 2024-02-05 NOTE — Patient Instructions (Signed)
" °  VISIT SUMMARY: During your visit, we discussed your blood pressure management, recent dehydration episode, nosebleeds, and history of pulmonary embolism. We reviewed your current medications and made plans for further evaluation and follow-up.  YOUR PLAN: -ESSENTIAL HYPERTENSION: Hypertension means high blood pressure. Your blood pressure is well-controlled with your current diltiazem  dosage, so we will continue with this dosage.  -PULMONARY EMBOLISM: A pulmonary embolism is a blood clot in the lungs. You are currently taking Eliquis  to prevent further clots. We will continue this medication for 3-6 months and have referred you to a hematologist to evaluate any underlying blood clotting issues and determine your long-term needs.  -ACUTE RENAL INSUFFICIENCY: Acute renal insufficiency means that your kidneys are not working as well as they should, possibly due to dehydration. We have ordered lab tests to reassess your kidney function.  INSTRUCTIONS: Please continue taking your medications as prescribed. Follow up with the hematologist as referred for your coagulopathy evaluation. We will review your kidney function once the lab results are available. If you experience any new or worsening symptoms, please contact our office.    Contains text generated by [employer].   "

## 2024-02-05 NOTE — Assessment & Plan Note (Signed)
 BP is stable following hydration and decreased dose of diltiazem .

## 2024-02-05 NOTE — Progress Notes (Signed)
" ° °  Established Patient Office Visit  Subjective   Patient ID: Amber [name], female    DOB: 07-26-1960  Age: 64 y.o. MRN: [id number]  Chief Complaint  Patient presents with   Hypertension    Here for follow up   Hypertension   Patient in office today for follow up after reduction in medication dose of diltiazem  from 240 mg to 120 mg on 01/24/2024. Patient reports reduction started on 01/25/2024 and has taken her blood pressure 1x per day and all readings have been <140/90. The lowest SBP was 120. Denies any issues with this dose change. Denies chest pain, SOB, or feeling dizzy. She reports she was seen in the ER recently for dehydration. She reports that since that visit on 01/25/2024 that she has done her best to increase hydration status by sipping on water throughout the day. Denies any urinary symptoms.Patient reports bloody mucous seen on two different occasions. Reports need for dental treatment and letter to sop eloquis for prcedure.  Review of Systems  Constitutional: Negative.   HENT:         Reports bloody mucous with blowing nose on 2 different occasions    Respiratory: Negative.    Cardiovascular: Negative.   Genitourinary: Negative.       Objective:     BP 116/65 (BP Location: Right Arm, Patient Position: Sitting, Cuff Size: Large)   Pulse 71   Temp 98.3 F (36.8 C) (Oral)   Resp 16   Ht 5' (1.524 m)   Wt 177 lb (80.3 kg)   LMP 08/09/2016   SpO2 100%   BMI 34.57 kg/m  BP Readings from Last 3 Encounters:  02/05/24 116/65  01/25/24 102/64  01/24/24 (!) 86/49   Physical Exam Constitutional:      Appearance: Normal appearance.  HENT:     Head: Normocephalic.     Nose: Nose normal.  Cardiovascular:     Rate and Rhythm: Normal rate and regular rhythm.     Heart sounds: Normal heart sounds.  Pulmonary:     Effort: Pulmonary effort is normal.     Breath sounds: Normal breath sounds.  Neurological:     Mental Status: She is alert and oriented to person,  place, and time.      Assessment & Plan:  Hypertension -stable on current medication regime  Acute renal insufficiency -CMET  Epistaxis  -Saline nasal spray -encouraged use of aquaphor   Pulmonary Embolism -educated patient on need to continue eliquis  for 3-6 months before stopping medication for dental procedure -referral to hematology   Return in 3 months.   Levon Budd, FNP student   "

## 2024-02-05 NOTE — Progress Notes (Signed)
 "  Subjective:     Patient ID: Amber [name], female    DOB: 1960-01-21, 64 y.o.   MRN: [id number]  Chief Complaint  Patient presents with   Hypertension    Here for follow up    Hypertension    Discussed the use of AI scribe software for clinical note transcription with the patient, who gave verbal consent to proceed.  History of Present Illness Amber [name] is a 64 year old female with hypertension who presents for a blood pressure follow-up after a reduction in diltiazem  dosage.  Her diltiazem  dosage was reduced at her last appointment, and she began the reduced dosage the following day. Since then, she has been monitoring her blood pressure once daily, with readings consistently below 140/90 mmHg, and the lowest recorded at 120 mmHg.  She was recently sent to the ER for dehydration. She reports that she does not measure her water intake but sips on water throughout the day. She wants to avoid another ER visit.  She experienced bloody mucus when blowing her nose on two occasions, which she attributes to her Eliquis  medication. This symptom made her feel nauseated.  She has a history of pulmonary embolism, for which she was hospitalized. Following her hospitalization, her creatinine level was elevated at 2.62 mg/dL, which was unusual for her. She was sent to the ER, where she received hydration and subsequently felt better. She declined admission at that time.  She is currently taking Eliquis  and tirzepatide .      Health Maintenance Due  Topic Date Due   COVID-19 Vaccine (6 - 2025-26 season) 09/02/2023   Pneumococcal Vaccine: 50+ Years (3 of 3 - PCV20 or PCV21) 09/10/2023    Past Medical History:  Diagnosis Date   Anemia    Arthritis    Asthma 05/03/2020   Back pain    Blood transfusion without reported diagnosis    CAD (coronary artery disease) 04/24/2017   Cataract    Cerebrovascular disease 10/13/2020   Chronic diastolic CHF (congestive heart  failure) (HCC) 04/24/2017   Colon polyps    Controlled type 2 diabetes mellitus without complication, without long-term current use of insulin  (HCC) 05/31/2017   Diabetic peripheral neuropathy (HCC) 01/05/2020   Ectopic pregnancy    RIGHT salpingostomy   Elective abortion    Elective abortion 09/26/2020   RIGHT salpingostomy   Essential hypertension 09/04/2017   GERD (gastroesophageal reflux disease)    Glaucoma    both eyes   Headache    migraines   History of chicken pox    History of chicken pox 09/26/2020   migraines   Hyperlipemia    Hypertension    Iron  deficiency anemia 09/02/2013   Joint pain    Migraine 04/19/2014   Obesity (BMI 30-39.9) 10/17/2012   Palpitations 10/01/2012   Pre-diabetes    Pulmonary embolism (HCC)    Rheumatoid arthritis (HCC) 11/07/2011   Rheumatoid arthritis(714.0) 11/07/2011   SAB (spontaneous abortion)    SAB (spontaneous abortion) 11/07/2011   Shortness of breath    Sjogren's disease    Swelling of lower extremity    Vitamin B12 deficiency    Wheezing     Past Surgical History:  Procedure Laterality Date   ABDOMINAL SURGERY     bowel repair   CATARACT EXTRACTION     02/25/2023, 03/11/2023   COLONOSCOPY     COLONOSCOPY  08/2022   DILATION AND CURETTAGE OF UTERUS     X 2   KNEE ARTHROSCOPY W/  MENISCAL REPAIR Right 01/16/2023   MENISCUS REPAIR Right    MENISCUS TEAR REPAIR   MYOMECTOMY N/A 07/27/2014   Procedure: ABDOMINAL MYOMECTOMY;  Surgeon: Jerolyn Foil, MD;  Location: [facility];  Service: Gynecology;  Laterality: N/A;   PELVIC LAPAROSCOPY  1994   IN 1999 LAPAROSCOPY WITH INCIDENTAL ENTEROTOMY REQUIRING LAPAROTOMY   ROTATOR CUFF REPAIR Left 06/21/2021   UTERINE FIBROID SURGERY     July 2016    Family History  Problem Relation Age of Onset   Hypertension Mother        died from heart disease   Heart disease Mother        deceased, unknown cause   Stroke Mother    Obesity Mother    High Cholesterol Mother    Sudden death  Father    Diabetes Sister    Hypertension Sister    Esophageal cancer Sister    Stomach cancer Sister    Cancer Sister    Diabetes Sister    Parkinson's disease Brother    Rectal cancer Neg Hx    Colon cancer Neg Hx    Colon polyps Neg Hx    Breast cancer Neg Hx     Social History   Socioeconomic History   Marital status: Single    Spouse name: Not on file   Number of children: 0   Years of education: college   Highest education level: Associate degree: occupational, scientist, product/process development, or vocational program  Occupational History   Occupation: LPN    Employer: [employer]  Tobacco Use   Smoking status: Never    Passive exposure: Never   Smokeless tobacco: Never  Vaping Use   Vaping status: Never Used  Substance and Sexual Activity   Alcohol use: No    Comment: occasional   Drug use: No   Sexual activity: Yes    Birth control/protection: None    Comment: 1st intercourse- 17, partners- 5  Other Topics Concern   Not on file  Social History Narrative   Regular exercise:  No   Caffeine Use: 1 cup coffee daily   No children   Happily divorced   Reports that she is involved at her church   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  Had a sister up in [place]  who passed away 24-Feb-2015 who she was close to.   Born in [place]- she came to [place]  as adult.  Age 83.    Left-handed.         Social Drivers of Health   Tobacco Use: Low Risk (01/25/2024)   Patient History    Smoking Tobacco Use: Never    Smokeless Tobacco Use: Never    Passive Exposure: Never  Financial Resource Strain: Medium Risk (11/27/2023)   Overall Financial Resource Strain (CARDIA)    Difficulty of Paying Living Expenses: Somewhat hard  Food Insecurity: Food Insecurity Present (01/23/2024)   Epic    Worried About Programme Researcher, Broadcasting/film/video in the Last Year: Never true    Ran Out of Food in the Last Year: Sometimes true  Transportation Needs: No Transportation Needs (01/23/2024)   Epic    Lack of  Transportation (Medical): No    Lack of Transportation (Non-Medical): No  Physical Activity: Inactive (01/25/2023)   Exercise Vital Sign    Days of Exercise per Week: 0 days    Minutes of Exercise per Session: 20 min  Stress: No Stress Concern Present (01/25/2023)   [employer] of Occupational Health - Occupational  Stress Questionnaire    Feeling of Stress : Not at all  Social Connections: Moderately Integrated (11/27/2023)   Social Connection and Isolation Panel    Frequency of Communication with Friends and Family: More than three times a week    Frequency of Social Gatherings with Friends and Family: Once a week    Attends Religious Services: More than 4 times per year    Active Member of [employer] or Organizations: Yes    Attends Banker Meetings: Not on file    Marital Status: Divorced  Intimate Partner Violence: Not At Risk (01/23/2024)   Epic    Fear of Current or Ex-Partner: No    Emotionally Abused: No    Physically Abused: No    Sexually Abused: No  Depression (PHQ2-9): Low Risk (08/06/2023)   Depression (PHQ2-9)    PHQ-2 Score: 0  Alcohol Screen: Not on file  Housing: Unknown (01/23/2024)   Epic    Unable to Pay for Housing in the Last Year: No    Number of Times Moved in the Last Year: Not on file    Homeless in the Last Year: No  Utilities: Not At Risk (01/23/2024)   Epic    Threatened with loss of utilities: No  Health Literacy: Not on file    Outpatient Medications Prior to Visit  Medication Sig Dispense Refill   albuterol  (VENTOLIN  HFA) 108 (90 Base) MCG/ACT inhaler Inhale 2 puffs into the lungs every 6 (six) hours as needed. (Patient taking differently: Inhale 2 puffs into the lungs every 6 (six) hours as needed for shortness of breath or wheezing.) 18 g 0   apixaban  (ELIQUIS ) 5 MG TABS tablet Take 1 tablet (5 mg total) by mouth 2 (two) times daily. 60 tablet 1   Aspirin  81 MG CAPS Take 81 mg by mouth daily at 12 noon.     brimonidine-timolol  (COMBIGAN) 0.2-0.5 % ophthalmic solution Place 1 drop into both eyes every 12 (twelve) hours.     calcium -vitamin D  (OSCAL WITH D) 500-200 MG-UNIT TABS tablet Take 1 tablet by mouth daily.     carvedilol  (COREG ) 12.5 MG tablet Take 1 tablet (12.5 mg total) by mouth 2 (two) times daily with a meal. 180 tablet 1   cyanocobalamin  (VITAMIN B12) 1000 MCG/ML injection 1000mcg IM weekly x 4 weeks then once monthly 12 mL 0   diltiazem  (DILACOR XR ) 120 MG 24 hr capsule Take 1 capsule (120 mg total) by mouth daily. 90 capsule 1   gabapentin  (NEURONTIN ) 100 MG capsule Take 1 capsule (100 mg total) by mouth at bedtime. 90 capsule 1   Iron , Ferrous Sulfate , 325 (65 Fe) MG TABS Take 325 mg by mouth 2 (two) times daily. 30 tablet    nystatin  (MYCOSTATIN /NYSTOP ) powder Apply 1 Application topically 3 (three) times daily. (Patient taking differently: Apply 1 Application topically 2 (two) times daily.) 30 g 1   ondansetron  (ZOFRAN -ODT) 4 MG disintegrating tablet 4mg  ODT q4 hours prn nausea/vomit 10 tablet 0   ORENCIA  CLICKJECT 125 MG/ML SOAJ INJECT 125 MG UNDER THE SKIN EVERY 7 DAYS (Patient taking differently: Inject 125 mg into the skin See admin instructions. Every 10 days) 4 mL 1   oxyCODONE  (OXY IR/ROXICODONE ) 5 MG immediate release tablet Take 1 tablet (5 mg total) by mouth every 6 (six) hours as needed for moderate pain (pain score 4-6) or severe pain (pain score 7-10). 12 tablet 0   polyethylene glycol (MIRALAX  / GLYCOLAX ) 17 g packet Take 17 g by  mouth 2 (two) times daily. Until stooling regularly. Then once daily or prn (Patient taking differently: Take 17 g by mouth daily as needed for mild constipation, moderate constipation or severe constipation.)     rosuvastatin  (CRESTOR ) 10 MG tablet Take 1 tablet (10 mg total) by mouth daily. 90 tablet 1   SUMAtriptan  (IMITREX ) 100 MG tablet Take 1 tablet (100 mg total) by mouth as needed for migraine. May repeat in 2 hours if headache persists or recurs. 30 tablet 5    Syringe/Needle, Disp, (SYRINGE 3CC/22GX1-1/2) 22G X 1-1/2 3 ML MISC Use as directed 50 each 0   tirzepatide  (MOUNJARO ) 7.5 MG/0.5ML Pen Inject 7.5 mg into the skin once a week. 6 mL 0   TRAVATAN Z 0.004 % SOLN ophthalmic solution Place 1 drop into both eyes at bedtime.      triamterene -hydrochlorothiazide  (MAXZIDE -25) 37.5-25 MG tablet Take 1 tablet by mouth daily.     Vitamin D , Ergocalciferol , (DRISDOL ) 1.25 MG (50000 UNIT) CAPS capsule Take 1 capsule (50,000 Units total) by mouth every 7 (seven) days. 13 capsule 0   No facility-administered medications prior to visit.    Allergies[1]  ROS    See HPI Objective:    Physical Exam Constitutional:      General: She is not in acute distress.    Appearance: Normal appearance. She is well-developed.  HENT:     Head: Normocephalic and atraumatic.     Right Ear: External ear normal.     Left Ear: External ear normal.  Eyes:     General: No scleral icterus. Neck:     Thyroid : No thyromegaly.  Cardiovascular:     Rate and Rhythm: Normal rate and regular rhythm.     Heart sounds: Normal heart sounds. No murmur heard. Pulmonary:     Effort: Pulmonary effort is normal. No respiratory distress.     Breath sounds: Normal breath sounds. No wheezing.  Musculoskeletal:     Cervical back: Neck supple.  Skin:    General: Skin is warm and dry.  Neurological:     Mental Status: She is alert and oriented to person, place, and time.  Psychiatric:        Mood and Affect: Mood normal.        Behavior: Behavior normal.        Thought Content: Thought content normal.        Judgment: Judgment normal.      BP 116/65 (BP Location: Right Arm, Patient Position: Sitting, Cuff Size: Large)   Pulse 71   Temp 98.3 F (36.8 C) (Oral)   Resp 16   Ht 5' (1.524 m)   Wt 177 lb (80.3 kg)   LMP 08/09/2016   SpO2 100%   BMI 34.57 kg/m  Wt Readings from Last 3 Encounters:  02/05/24 177 lb (80.3 kg)  01/24/24 170 lb (77.1 kg)  01/17/24 167 lb  (75.8 kg)       Assessment & Plan:   Problem List Items Addressed This Visit       Unprioritized   Pulmonary embolism (HCC)    On apixaban  for anticoagulation. No clear risk factors, possible underlying coagulopathy. Discussed Eliquis  continuation for 3-6 months. - Continue apixaban  (Eliquis ) for 3-6 months. - Referred to hematology for coagulopathy evaluation and long-term anticoagulation needs. - Sent note to dentist regarding need to continue Eliquis  for at least 3-6 months.         Essential hypertension   BP is stable following hydration and decreased dose  of diltiazem .       Acute renal insufficiency - Primary   Update bmet.       Relevant Orders   Comp Met (CMET)    I am having Thalya D. Copelan maintain her Travatan Z, calcium -vitamin D , Iron  (Ferrous Sulfate ), albuterol , SUMAtriptan , brimonidine-timolol, SYRINGE 3CC/22GX1-1/2, Aspirin , carvedilol , gabapentin , cyanocobalamin , rosuvastatin , Orencia  ClickJect, polyethylene glycol, nystatin , Vitamin D  (Ergocalciferol ), tirzepatide , triamterene -hydrochlorothiazide , oxyCODONE , diltiazem , apixaban , and ondansetron .   Assessment & Plan       [1]  Allergies Allergen Reactions   Gabapentin  Other (See Comments)    Made her crazy   "

## 2024-02-05 NOTE — Assessment & Plan Note (Signed)
Update bmet.

## 2024-02-05 NOTE — Assessment & Plan Note (Signed)
" °  On apixaban  for anticoagulation. No clear risk factors, possible underlying coagulopathy. Discussed Eliquis  continuation for 3-6 months. - Continue apixaban  (Eliquis ) for 3-6 months. - Referred to hematology for coagulopathy evaluation and long-term anticoagulation needs. - Sent note to dentist regarding need to continue Eliquis  for at least 3-6 months.    "

## 2024-02-06 ENCOUNTER — Ambulatory Visit: Payer: Self-pay | Admitting: Family

## 2024-02-07 ENCOUNTER — Telehealth: Payer: Self-pay

## 2024-02-07 NOTE — Telephone Encounter (Signed)
 Bernarda from [facility] with [facility] contacted the office inquiring about the status of the patient's Orencia  PA. Myles Bernarda I did not see anything in the system about the PA. Bernarda states they faxed over Questions for the PA to be filled out and faxed back on 02/05/2024. Myles Bernarda I would send a message to the Pharmacy team to see if there has been any update. Alicia's call back number is [phone], option 3. Please advise.

## 2024-02-12 ENCOUNTER — Inpatient Hospital Stay

## 2024-02-12 ENCOUNTER — Inpatient Hospital Stay: Admitting: Family

## 2024-02-27 ENCOUNTER — Ambulatory Visit: Admitting: Rheumatology

## 2024-02-28 ENCOUNTER — Ambulatory Visit: Admitting: Family

## 2024-05-08 ENCOUNTER — Ambulatory Visit: Admitting: Family
